# Patient Record
Sex: Male | Born: 1965 | Race: White | Hispanic: No | Marital: Married | Smoking: Current every day smoker
Health system: Southern US, Community
[De-identification: ages and names within clinical notes are randomized; demographics above are authoritative.]

## PROBLEM LIST (undated history)

## (undated) DIAGNOSIS — I6789 Other cerebrovascular disease: Secondary | ICD-10-CM

## (undated) DIAGNOSIS — G43909 Migraine, unspecified, not intractable, without status migrainosus: Secondary | ICD-10-CM

## (undated) DIAGNOSIS — Q231 Congenital insufficiency of aortic valve: Secondary | ICD-10-CM

## (undated) DIAGNOSIS — R0602 Shortness of breath: Secondary | ICD-10-CM

## (undated) DIAGNOSIS — I350 Nonrheumatic aortic (valve) stenosis: Secondary | ICD-10-CM

## (undated) DIAGNOSIS — I639 Cerebral infarction, unspecified: Secondary | ICD-10-CM

## (undated) DIAGNOSIS — K219 Gastro-esophageal reflux disease without esophagitis: Secondary | ICD-10-CM

## (undated) DIAGNOSIS — E039 Hypothyroidism, unspecified: Secondary | ICD-10-CM

## (undated) DIAGNOSIS — I69920 Aphasia following unspecified cerebrovascular disease: Secondary | ICD-10-CM

## (undated) DIAGNOSIS — Z7901 Long term (current) use of anticoagulants: Secondary | ICD-10-CM

## (undated) DIAGNOSIS — I712 Thoracic aortic aneurysm, without rupture, unspecified: Secondary | ICD-10-CM

## (undated) DIAGNOSIS — R55 Syncope and collapse: Secondary | ICD-10-CM

## (undated) DIAGNOSIS — G609 Hereditary and idiopathic neuropathy, unspecified: Secondary | ICD-10-CM

## (undated) DIAGNOSIS — Z952 Presence of prosthetic heart valve: Secondary | ICD-10-CM

## (undated) DIAGNOSIS — I359 Nonrheumatic aortic valve disorder, unspecified: Secondary | ICD-10-CM

## (undated) DIAGNOSIS — R51 Headache: Secondary | ICD-10-CM

## (undated) DIAGNOSIS — F419 Anxiety disorder, unspecified: Secondary | ICD-10-CM

## (undated) DIAGNOSIS — I6990 Unspecified sequelae of unspecified cerebrovascular disease: Secondary | ICD-10-CM

## (undated) DIAGNOSIS — G450 Vertebro-basilar artery syndrome: Secondary | ICD-10-CM

## (undated) DIAGNOSIS — I480 Paroxysmal atrial fibrillation: Secondary | ICD-10-CM

## (undated) DIAGNOSIS — Z8673 Personal history of transient ischemic attack (TIA), and cerebral infarction without residual deficits: Secondary | ICD-10-CM

## (undated) DIAGNOSIS — I7100 Dissection of unspecified site of aorta: Secondary | ICD-10-CM

## (undated) DIAGNOSIS — H53489 Generalized contraction of visual field, unspecified eye: Secondary | ICD-10-CM

## (undated) DIAGNOSIS — I6330 Cerebral infarction due to thrombosis of unspecified cerebral artery: Secondary | ICD-10-CM

## (undated) DIAGNOSIS — Q2381 Bicuspid aortic valve: Secondary | ICD-10-CM

## (undated) DIAGNOSIS — I499 Cardiac arrhythmia, unspecified: Secondary | ICD-10-CM

## (undated) DIAGNOSIS — R471 Dysarthria and anarthria: Secondary | ICD-10-CM

## (undated) DIAGNOSIS — J189 Pneumonia, unspecified organism: Secondary | ICD-10-CM

## (undated) DIAGNOSIS — I616 Nontraumatic intracerebral hemorrhage, multiple localized: Secondary | ICD-10-CM

## (undated) DIAGNOSIS — K8020 Calculus of gallbladder without cholecystitis without obstruction: Secondary | ICD-10-CM

## (undated) HISTORY — DX: Dysarthria and anarthria: R47.1

## (undated) HISTORY — DX: Vertebro-basilar artery syndrome: G45.0

## (undated) HISTORY — DX: Long term (current) use of anticoagulants: Z79.01

## (undated) HISTORY — DX: Cerebral infarction, unspecified: I63.9

## (undated) HISTORY — DX: Nontraumatic intracerebral hemorrhage, multiple localized: I61.6

## (undated) HISTORY — PX: ROTATOR CUFF REPAIR: SHX139

## (undated) HISTORY — DX: Syncope and collapse: R55

## (undated) HISTORY — DX: Other cerebrovascular disease: I67.89

## (undated) HISTORY — DX: Unspecified sequelae of unspecified cerebrovascular disease: I69.90

## (undated) HISTORY — DX: Pneumonia, unspecified organism: J18.9

## (undated) HISTORY — PX: CHOLECYSTECTOMY: SHX55

## (undated) HISTORY — DX: Thoracic aortic aneurysm, without rupture: I71.2

## (undated) HISTORY — DX: Nonrheumatic aortic valve disorder, unspecified: I35.9

## (undated) HISTORY — DX: Personal history of transient ischemic attack (TIA), and cerebral infarction without residual deficits: Z86.73

## (undated) HISTORY — PX: CARDIAC CATHETERIZATION: SHX172

## (undated) HISTORY — DX: Cerebral infarction due to thrombosis of unspecified cerebral artery: I63.30

## (undated) HISTORY — DX: Presence of prosthetic heart valve: Z95.2

## (undated) HISTORY — DX: Aphasia following unspecified cerebrovascular disease: I69.920

## (undated) HISTORY — DX: Hereditary and idiopathic neuropathy, unspecified: G60.9

## (undated) HISTORY — DX: Calculus of gallbladder without cholecystitis without obstruction: K80.20

## (undated) HISTORY — DX: Paroxysmal atrial fibrillation: I48.0

## (undated) HISTORY — DX: Generalized contraction of visual field, unspecified eye: H53.489

## (undated) HISTORY — DX: Dissection of unspecified site of aorta: I71.00

---

## 2012-04-22 ENCOUNTER — Emergency Department (HOSPITAL_COMMUNITY): Payer: Medicaid Other | Admitting: Anesthesiology

## 2012-04-22 ENCOUNTER — Encounter (HOSPITAL_COMMUNITY): Payer: Self-pay | Admitting: Anesthesiology

## 2012-04-22 ENCOUNTER — Encounter (HOSPITAL_COMMUNITY): Payer: Self-pay | Admitting: Certified Registered Nurse Anesthetist

## 2012-04-22 ENCOUNTER — Inpatient Hospital Stay (HOSPITAL_COMMUNITY): Payer: Medicaid Other

## 2012-04-22 ENCOUNTER — Encounter (HOSPITAL_COMMUNITY)
Admission: EM | Disposition: A | Discharge status: Home / Self Care | Payer: Self-pay | Source: Home / Self Care | Attending: Thoracic Surgery (Cardiothoracic Vascular Surgery)

## 2012-04-22 ENCOUNTER — Inpatient Hospital Stay (HOSPITAL_COMMUNITY)
Admission: EM | Admit: 2012-04-22 | Discharge: 2012-05-03 | DRG: 219 | Disposition: A | Discharge status: Home / Self Care | Payer: Medicaid Other | Attending: Thoracic Surgery (Cardiothoracic Vascular Surgery) | Admitting: Thoracic Surgery (Cardiothoracic Vascular Surgery)

## 2012-04-22 DIAGNOSIS — I7101 Dissection of thoracic aorta: Principal | ICD-10-CM | POA: Diagnosis present

## 2012-04-22 DIAGNOSIS — H35039 Hypertensive retinopathy, unspecified eye: Secondary | ICD-10-CM | POA: Diagnosis present

## 2012-04-22 DIAGNOSIS — I7779 Dissection of other artery: Secondary | ICD-10-CM | POA: Diagnosis present

## 2012-04-22 DIAGNOSIS — I6350 Cerebral infarction due to unspecified occlusion or stenosis of unspecified cerebral artery: Secondary | ICD-10-CM | POA: Diagnosis not present

## 2012-04-22 DIAGNOSIS — I7100 Dissection of unspecified site of aorta: Secondary | ICD-10-CM

## 2012-04-22 DIAGNOSIS — H538 Other visual disturbances: Secondary | ICD-10-CM | POA: Diagnosis present

## 2012-04-22 DIAGNOSIS — D696 Thrombocytopenia, unspecified: Secondary | ICD-10-CM | POA: Diagnosis not present

## 2012-04-22 DIAGNOSIS — R1310 Dysphagia, unspecified: Secondary | ICD-10-CM | POA: Diagnosis not present

## 2012-04-22 DIAGNOSIS — F411 Generalized anxiety disorder: Secondary | ICD-10-CM | POA: Diagnosis present

## 2012-04-22 DIAGNOSIS — R112 Nausea with vomiting, unspecified: Secondary | ICD-10-CM | POA: Diagnosis not present

## 2012-04-22 DIAGNOSIS — T8131XA Disruption of external operation (surgical) wound, not elsewhere classified, initial encounter: Secondary | ICD-10-CM | POA: Diagnosis not present

## 2012-04-22 DIAGNOSIS — J690 Pneumonitis due to inhalation of food and vomit: Secondary | ICD-10-CM | POA: Diagnosis not present

## 2012-04-22 DIAGNOSIS — I4891 Unspecified atrial fibrillation: Secondary | ICD-10-CM | POA: Diagnosis not present

## 2012-04-22 DIAGNOSIS — Y838 Other surgical procedures as the cause of abnormal reaction of the patient, or of later complication, without mention of misadventure at the time of the procedure: Secondary | ICD-10-CM | POA: Diagnosis not present

## 2012-04-22 DIAGNOSIS — I498 Other specified cardiac arrhythmias: Secondary | ICD-10-CM | POA: Diagnosis not present

## 2012-04-22 DIAGNOSIS — D689 Coagulation defect, unspecified: Secondary | ICD-10-CM | POA: Diagnosis present

## 2012-04-22 DIAGNOSIS — I7771 Dissection of carotid artery: Secondary | ICD-10-CM | POA: Diagnosis present

## 2012-04-22 DIAGNOSIS — I71019 Dissection of thoracic aorta, unspecified: Principal | ICD-10-CM | POA: Diagnosis present

## 2012-04-22 DIAGNOSIS — D62 Acute posthemorrhagic anemia: Secondary | ICD-10-CM | POA: Diagnosis not present

## 2012-04-22 DIAGNOSIS — F17200 Nicotine dependence, unspecified, uncomplicated: Secondary | ICD-10-CM | POA: Diagnosis present

## 2012-04-22 DIAGNOSIS — I10 Essential (primary) hypertension: Secondary | ICD-10-CM | POA: Diagnosis present

## 2012-04-22 DIAGNOSIS — J9819 Other pulmonary collapse: Secondary | ICD-10-CM | POA: Diagnosis not present

## 2012-04-22 DIAGNOSIS — Q231 Congenital insufficiency of aortic valve: Secondary | ICD-10-CM

## 2012-04-22 DIAGNOSIS — E8779 Other fluid overload: Secondary | ICD-10-CM | POA: Diagnosis not present

## 2012-04-22 DIAGNOSIS — E871 Hypo-osmolality and hyponatremia: Secondary | ICD-10-CM | POA: Diagnosis not present

## 2012-04-22 DIAGNOSIS — I519 Heart disease, unspecified: Secondary | ICD-10-CM | POA: Diagnosis not present

## 2012-04-22 HISTORY — DX: Bicuspid aortic valve: Q23.81

## 2012-04-22 HISTORY — PX: CORONARY ARTERY BYPASS GRAFT: SHX141

## 2012-04-22 HISTORY — PX: THORACIC AORTIC ANEURYSM REPAIR: SHX799

## 2012-04-22 HISTORY — DX: Nonrheumatic aortic (valve) stenosis: I35.0

## 2012-04-22 HISTORY — DX: Congenital insufficiency of aortic valve: Q23.1

## 2012-04-22 HISTORY — DX: Anxiety disorder, unspecified: F41.9

## 2012-04-22 HISTORY — PX: INTRAOPERATIVE TRANSESOPHAGEAL ECHOCARDIOGRAM: SHX5062

## 2012-04-22 LAB — POCT I-STAT 3, ART BLOOD GAS (G3+)
Acid-Base Excess: 5 mmol/L — ABNORMAL HIGH (ref 0.0–2.0)
Acid-base deficit: 7 mmol/L — ABNORMAL HIGH (ref 0.0–2.0)
Acid-base deficit: 2 mmol/L (ref 0.0–2.0)
Bicarbonate: 23.5 mEq/L (ref 20.0–24.0)
Bicarbonate: 30.4 mEq/L — ABNORMAL HIGH (ref 20.0–24.0)
Bicarbonate: 20.5 mEq/L (ref 20.0–24.0)
Bicarbonate: 24.3 mEq/L — ABNORMAL HIGH (ref 20.0–24.0)
Bicarbonate: 25.8 mEq/L — ABNORMAL HIGH (ref 20.0–24.0)
O2 Saturation: 100 %
O2 Saturation: 100 %
O2 Saturation: 90 %
O2 Saturation: 96 %
Patient temperature: 37.6
TCO2: 27 mmol/L (ref 0–100)
TCO2: 32 mmol/L (ref 0–100)
TCO2: 19 mmol/L (ref 0–100)
TCO2: 27 mmol/L (ref 0–100)
pCO2 arterial: 60.9 mmHg (ref 35.0–45.0)
pCO2 arterial: 77.1 mmHg (ref 35.0–45.0)
pCO2 arterial: 31.8 mmHg — ABNORMAL LOW (ref 35.0–45.0)
pCO2 arterial: 50.8 mmHg — ABNORMAL HIGH (ref 35.0–45.0)
pCO2 arterial: 52.9 mmHg — ABNORMAL HIGH (ref 35.0–45.0)
pH, Arterial: 7.093 — CL (ref 7.350–7.450)
pH, Arterial: 7.194 — CL (ref 7.350–7.450)
pH, Arterial: 7.227 — ABNORMAL LOW (ref 7.350–7.450)
pO2, Arterial: 318 mmHg — ABNORMAL HIGH (ref 80.0–100.0)
pO2, Arterial: 191 mmHg — ABNORMAL HIGH (ref 80.0–100.0)
pO2, Arterial: 168 mmHg — ABNORMAL HIGH (ref 80.0–100.0)
pO2, Arterial: 360 mmHg — ABNORMAL HIGH (ref 80.0–100.0)
pO2, Arterial: 71 mmHg — ABNORMAL LOW (ref 80.0–100.0)

## 2012-04-22 LAB — POCT I-STAT 4, (NA,K, GLUC, HGB,HCT)
Glucose, Bld: 124 mg/dL — ABNORMAL HIGH (ref 70–99)
Glucose, Bld: 190 mg/dL — ABNORMAL HIGH (ref 70–99)
Glucose, Bld: 175 mg/dL — ABNORMAL HIGH (ref 70–99)
Glucose, Bld: 141 mg/dL — ABNORMAL HIGH (ref 70–99)
HCT: 34 % — ABNORMAL LOW (ref 39.0–52.0)
HCT: 27 % — ABNORMAL LOW (ref 39.0–52.0)
HCT: 32 % — ABNORMAL LOW (ref 39.0–52.0)
HCT: 30 % — ABNORMAL LOW (ref 39.0–52.0)
Hemoglobin: 11.6 g/dL — ABNORMAL LOW (ref 13.0–17.0)
Hemoglobin: 9.9 g/dL — ABNORMAL LOW (ref 13.0–17.0)
Hemoglobin: 9.2 g/dL — ABNORMAL LOW (ref 13.0–17.0)
Hemoglobin: 10.9 g/dL — ABNORMAL LOW (ref 13.0–17.0)
Hemoglobin: 6.5 g/dL — CL (ref 13.0–17.0)
Hemoglobin: 9.5 g/dL — ABNORMAL LOW (ref 13.0–17.0)
Hemoglobin: 10.2 g/dL — ABNORMAL LOW (ref 13.0–17.0)
Hemoglobin: 7.5 g/dL — ABNORMAL LOW (ref 13.0–17.0)
Potassium: 4.5 mEq/L (ref 3.5–5.1)
Potassium: 3.3 mEq/L — ABNORMAL LOW (ref 3.5–5.1)
Potassium: 4 mEq/L (ref 3.5–5.1)
Potassium: 4.5 mEq/L (ref 3.5–5.1)
Potassium: 4.2 mEq/L (ref 3.5–5.1)
Sodium: 136 mEq/L (ref 135–145)
Sodium: 138 mEq/L (ref 135–145)
Sodium: 130 mEq/L — ABNORMAL LOW (ref 135–145)
Sodium: 153 mEq/L — ABNORMAL HIGH (ref 135–145)
Sodium: 142 mEq/L (ref 135–145)

## 2012-04-22 LAB — POCT I-STAT, CHEM 8
BUN: 13 mg/dL (ref 6–23)
Calcium, Ion: 1.14 mmol/L (ref 1.12–1.23)
Chloride: 105 mEq/L (ref 96–112)
Creatinine, Ser: 0.9 mg/dL (ref 0.50–1.35)
Glucose, Bld: 120 mg/dL — ABNORMAL HIGH (ref 70–99)

## 2012-04-22 LAB — CREATININE, SERUM
Creatinine, Ser: 0.96 mg/dL (ref 0.50–1.35)
GFR calc non Af Amer: >90 mL/min (ref >90)

## 2012-04-22 LAB — CBC WITH DIFFERENTIAL/PLATELET
Basophils Absolute: 0 10*3/uL (ref 0.0–0.1)
HCT: 37.6 % — ABNORMAL LOW (ref 39.0–52.0)
Hemoglobin: 12.9 g/dL — ABNORMAL LOW (ref 13.0–17.0)
Lymphocytes Relative: 10 % — ABNORMAL LOW (ref 12–46)
Monocytes Absolute: 0.6 10*3/uL (ref 0.1–1.0)
Monocytes Relative: 6 % (ref 3–12)
Neutro Abs: 9.3 10*3/uL — ABNORMAL HIGH (ref 1.7–7.7)
WBC: 11.1 10*3/uL — ABNORMAL HIGH (ref 4.0–10.5)

## 2012-04-22 LAB — CBC
HCT: 18.6 % — ABNORMAL LOW (ref 39.0–52.0)
Hemoglobin: 6.3 g/dL — CL (ref 13.0–17.0)
RBC: 2.11 MIL/uL — ABNORMAL LOW (ref 4.22–5.81)
WBC: 12.3 10*3/uL — ABNORMAL HIGH (ref 4.0–10.5)

## 2012-04-22 LAB — DIC (DISSEMINATED INTRAVASCULAR COAGULATION)PANEL
D-Dimer, Quant: 3.88 ug/mL-FEU — ABNORMAL HIGH (ref 0.00–0.48)
Fibrinogen: 243 mg/dL (ref 204–475)
Fibrinogen: 117 mg/dL — ABNORMAL LOW (ref 204–475)
INR: 2.2 — ABNORMAL HIGH (ref 0.00–1.49)
Platelets: 63 10*3/uL — ABNORMAL LOW (ref 150–400)
Prothrombin Time: 23.5 seconds — ABNORMAL HIGH (ref 11.6–15.2)
Smear Review: NONE SEEN
aPTT: 35 seconds (ref 24–37)
aPTT: 80 seconds — ABNORMAL HIGH (ref 24–37)

## 2012-04-22 LAB — PROTIME-INR: INR: 0.87 (ref 0.00–1.49)

## 2012-04-22 LAB — ABO/RH: ABO/RH(D): O POS

## 2012-04-22 LAB — APTT: aPTT: 43 seconds — ABNORMAL HIGH (ref 24–37)

## 2012-04-22 LAB — GLUCOSE, CAPILLARY: Glucose-Capillary: 78 mg/dL (ref 70–99)

## 2012-04-22 LAB — PLATELET COUNT: Platelets: 35 10*3/uL — ABNORMAL LOW (ref 150–400)

## 2012-04-22 LAB — HEMOGLOBIN AND HEMATOCRIT, BLOOD: Hemoglobin: 9.7 g/dL — ABNORMAL LOW (ref 13.0–17.0)

## 2012-04-22 SURGERY — REPAIR, ANEURYSM, AORTA, THORACIC, ASCENDING
Anesthesia: General | Site: Chest

## 2012-04-22 MED ORDER — PHENYLEPHRINE HCL 10 MG/ML IJ SOLN
0.0000 ug/min | INTRAVENOUS | Status: DC
  Administered 2012-04-22 (×2): 66.667 ug/min via INTRAVENOUS
  Administered 2012-04-23: 53.333 ug/min via INTRAVENOUS
  Administered 2012-04-23: 66.667 ug/min via INTRAVENOUS
  Filled 2012-04-22 (×5): qty 2

## 2012-04-22 MED ORDER — SODIUM CHLORIDE 0.9 % IJ SOLN
OROMUCOSAL | Status: DC | PRN
  Administered 2012-04-22 (×4): via TOPICAL

## 2012-04-22 MED ORDER — DEXMEDETOMIDINE HCL IN NACL 200 MCG/50ML IV SOLN
0.1000 ug/kg/h | INTRAVENOUS | Status: DC
  Administered 2012-04-22 – 2012-04-23 (×5): 0.7 ug/kg/h via INTRAVENOUS
  Filled 2012-04-22 (×5): qty 50

## 2012-04-22 MED ORDER — PLASMA-LYTE 148 IV SOLN
INTRAVENOUS | Status: DC
  Filled 2012-04-22: qty 2.5

## 2012-04-22 MED ORDER — ETOMIDATE 2 MG/ML IV SOLN
INTRAVENOUS | Status: DC | PRN
  Administered 2012-04-22 (×2): 20 mg via INTRAVENOUS
  Administered 2012-04-22: 10 mg via INTRAVENOUS

## 2012-04-22 MED ORDER — SUCCINYLCHOLINE CHLORIDE 20 MG/ML IJ SOLN
INTRAMUSCULAR | Status: DC | PRN
  Administered 2012-04-22: 140 mg via INTRAVENOUS

## 2012-04-22 MED ORDER — POTASSIUM CHLORIDE 2 MEQ/ML IV SOLN
80.0000 meq | INTRAVENOUS | Status: DC
  Filled 2012-04-22: qty 40

## 2012-04-22 MED ORDER — LACTATED RINGERS IV SOLN
INTRAVENOUS | Status: DC | PRN
  Administered 2012-04-22: 02:00:00 via INTRAVENOUS

## 2012-04-22 MED ORDER — ASPIRIN 81 MG PO CHEW: 324.0000 mg | CHEWABLE_TABLET | Freq: Every day | ORAL | Status: DC

## 2012-04-22 MED ORDER — VANCOMYCIN HCL 10 G IV SOLR
1250.0000 mg | INTRAVENOUS | Status: DC
  Filled 2012-04-22: qty 1250

## 2012-04-22 MED ORDER — LEVOFLOXACIN IN D5W 500 MG/100ML IV SOLN
500.0000 mg | INTRAVENOUS | Status: AC
  Administered 2012-04-22: .5 g via INTRAVENOUS
  Filled 2012-04-22: qty 100

## 2012-04-22 MED ORDER — MAGNESIUM SULFATE 50 % IJ SOLN
40.0000 meq | INTRAMUSCULAR | Status: DC
  Filled 2012-04-22: qty 10

## 2012-04-22 MED ORDER — 0.9 % SODIUM CHLORIDE (POUR BTL) OPTIME
TOPICAL | Status: DC | PRN
  Administered 2012-04-22: 6000 mL

## 2012-04-22 MED ORDER — VANCOMYCIN HCL 1000 MG IV SOLR
1000.0000 mg | INTRAVENOUS | Status: DC | PRN
  Administered 2012-04-22: 1250 mg via INTRAVENOUS

## 2012-04-22 MED ORDER — DEXMEDETOMIDINE HCL IN NACL 400 MCG/100ML IV SOLN
0.1000 ug/kg/h | INTRAVENOUS | Status: AC
  Administered 2012-04-22: 0.2 ug/kg/h via INTRAVENOUS
  Filled 2012-04-22 (×2): qty 100

## 2012-04-22 MED ORDER — PANTOPRAZOLE SODIUM 40 MG PO TBEC
40.0000 mg | DELAYED_RELEASE_TABLET | Freq: Every day | ORAL | Status: DC
  Administered 2012-04-24 – 2012-05-03 (×9): 40 mg via ORAL
  Filled 2012-04-22 (×9): qty 1

## 2012-04-22 MED ORDER — AMIODARONE LOAD VIA INFUSION
150.0000 mg | Freq: Once | INTRAVENOUS | Status: DC
  Filled 2012-04-22: qty 83.34

## 2012-04-22 MED ORDER — AMIODARONE HCL IN DEXTROSE 360-4.14 MG/200ML-% IV SOLN
60.0000 mg/h | INTRAVENOUS | Status: DC
  Filled 2012-04-22: qty 200

## 2012-04-22 MED ORDER — PROTAMINE SULFATE 10 MG/ML IV SOLN
INTRAVENOUS | Status: DC | PRN
  Administered 2012-04-22: 250 mg via INTRAVENOUS
  Administered 2012-04-22: 25 mg via INTRAVENOUS
  Administered 2012-04-22: 210 mg via INTRAVENOUS

## 2012-04-22 MED ORDER — METOPROLOL TARTRATE 1 MG/ML IV SOLN
2.5000 mg | INTRAVENOUS | Status: DC | PRN
  Administered 2012-04-26: 5 mg via INTRAVENOUS
  Filled 2012-04-22: qty 5

## 2012-04-22 MED ORDER — ALBUMIN HUMAN 5 % IV SOLN
INTRAVENOUS | Status: DC | PRN
  Administered 2012-04-22: 10:00:00 via INTRAVENOUS

## 2012-04-22 MED ORDER — MIDAZOLAM HCL 5 MG/5ML IJ SOLN
INTRAMUSCULAR | Status: DC | PRN
  Administered 2012-04-22: 3 mg via INTRAVENOUS
  Administered 2012-04-22: 2 mg via INTRAVENOUS
  Administered 2012-04-22 (×2): 3 mg via INTRAVENOUS
  Administered 2012-04-22 (×2): 2 mg via INTRAVENOUS

## 2012-04-22 MED ORDER — AMIODARONE HCL IN DEXTROSE 360-4.14 MG/200ML-% IV SOLN
INTRAVENOUS | Status: DC | PRN
  Administered 2012-04-22: 60 mg/h via INTRAVENOUS

## 2012-04-22 MED ORDER — DOCUSATE SODIUM 100 MG PO CAPS
200.0000 mg | ORAL_CAPSULE | Freq: Every day | ORAL | Status: DC
  Administered 2012-04-24 – 2012-05-03 (×7): 200 mg via ORAL
  Filled 2012-04-22 (×9): qty 2

## 2012-04-22 MED ORDER — SODIUM CHLORIDE 0.9 % IV SOLN
INTRAVENOUS | Status: AC
  Administered 2012-04-22: 1.3 [IU]/h via INTRAVENOUS
  Filled 2012-04-22: qty 1

## 2012-04-22 MED ORDER — HEMOSTATIC AGENTS (NO CHARGE) OPTIME
TOPICAL | Status: DC | PRN
  Administered 2012-04-22 (×11): 1 via TOPICAL

## 2012-04-22 MED ORDER — PHENYLEPHRINE HCL 10 MG/ML IJ SOLN
20.0000 mg | INTRAVENOUS | Status: DC | PRN
  Administered 2012-04-22: 25 ug/min via INTRAVENOUS

## 2012-04-22 MED ORDER — AMIODARONE IV BOLUS ONLY 150 MG/100ML
INTRAVENOUS | Status: DC | PRN
  Administered 2012-04-22: 150 mg via INTRAVENOUS

## 2012-04-22 MED ORDER — SODIUM CHLORIDE 0.9 % IV SOLN
INTRAVENOUS | Status: DC | PRN
  Administered 2012-04-22 (×2): via INTRAVENOUS

## 2012-04-22 MED ORDER — ACETAMINOPHEN 160 MG/5ML PO SOLN
975.0000 mg | Freq: Four times a day (QID) | ORAL | Status: DC
  Filled 2012-04-22: qty 40.6

## 2012-04-22 MED ORDER — MAGNESIUM SULFATE 40 MG/ML IJ SOLN
INTRAMUSCULAR | Status: AC
  Administered 2012-04-22: 15:00:00
  Filled 2012-04-22: qty 100

## 2012-04-22 MED ORDER — OXYCODONE HCL 5 MG PO TABS
5.0000 mg | ORAL_TABLET | ORAL | Status: DC | PRN
  Administered 2012-04-23 – 2012-04-24 (×7): 10 mg via ORAL
  Administered 2012-04-25 – 2012-04-26 (×2): 5 mg via ORAL
  Filled 2012-04-22: qty 1
  Filled 2012-04-22 (×4): qty 2
  Filled 2012-04-22: qty 1
  Filled 2012-04-22 (×3): qty 2

## 2012-04-22 MED ORDER — MAGNESIUM SULFATE 40 MG/ML IJ SOLN
4.0000 g | Freq: Once | INTRAMUSCULAR | Status: AC
  Administered 2012-04-22: 4 g via INTRAVENOUS

## 2012-04-22 MED ORDER — VECURONIUM BROMIDE 10 MG IV SOLR
INTRAVENOUS | Status: DC | PRN
  Administered 2012-04-22 (×5): 5 mg via INTRAVENOUS

## 2012-04-22 MED ORDER — POTASSIUM CHLORIDE 10 MEQ/50ML IV SOLN: 10.0000 meq | INTRAVENOUS | Status: AC

## 2012-04-22 MED ORDER — SODIUM CHLORIDE 0.9 % IV SOLN
INTRAVENOUS | Status: DC
  Filled 2012-04-22: qty 1

## 2012-04-22 MED ORDER — VANCOMYCIN HCL IN DEXTROSE 1-5 GM/200ML-% IV SOLN
1000.0000 mg | Freq: Once | INTRAVENOUS | Status: AC
  Administered 2012-04-22: 1000 mg via INTRAVENOUS
  Filled 2012-04-22: qty 200

## 2012-04-22 MED ORDER — DOPAMINE-DEXTROSE 3.2-5 MG/ML-% IV SOLN
0.0000 ug/kg/min | INTRAVENOUS | Status: DC
  Administered 2012-04-23: 5 ug/kg/min via INTRAVENOUS
  Filled 2012-04-22: qty 250

## 2012-04-22 MED ORDER — ARTIFICIAL TEARS OP OINT
TOPICAL_OINTMENT | OPHTHALMIC | Status: DC | PRN
  Administered 2012-04-22: 1 via OPHTHALMIC

## 2012-04-22 MED ORDER — DEXTROSE 5 % IV SOLN
30.0000 ug/min | INTRAVENOUS | Status: DC
  Filled 2012-04-22: qty 2

## 2012-04-22 MED ORDER — ONDANSETRON HCL 4 MG/2ML IJ SOLN
4.0000 mg | Freq: Four times a day (QID) | INTRAMUSCULAR | Status: DC | PRN
  Administered 2012-04-24 – 2012-04-25 (×2): 4 mg via INTRAVENOUS
  Filled 2012-04-22 (×3): qty 2

## 2012-04-22 MED ORDER — LACTATED RINGERS IV SOLN: INTRAVENOUS | Status: DC

## 2012-04-22 MED ORDER — ROCURONIUM BROMIDE 100 MG/10ML IV SOLN
INTRAVENOUS | Status: DC | PRN
  Administered 2012-04-22: 50 mg via INTRAVENOUS

## 2012-04-22 MED ORDER — INSULIN ASPART 100 UNIT/ML ~~LOC~~ SOLN
0.0000 [IU] | SUBCUTANEOUS | Status: DC
  Administered 2012-04-22 – 2012-04-23 (×2): 2 [IU] via SUBCUTANEOUS

## 2012-04-22 MED ORDER — SODIUM CHLORIDE 0.9 % IV SOLN: 250.0000 mL | INTRAVENOUS | Status: DC

## 2012-04-22 MED ORDER — SODIUM CHLORIDE 0.45 % IV SOLN
INTRAVENOUS | Status: DC
  Administered 2012-04-22 – 2012-04-25 (×2): via INTRAVENOUS

## 2012-04-22 MED ORDER — METOPROLOL TARTRATE 25 MG/10 ML ORAL SUSPENSION
12.5000 mg | Freq: Two times a day (BID) | ORAL | Status: DC
  Filled 2012-04-22 (×5): qty 5

## 2012-04-22 MED ORDER — DOPAMINE-DEXTROSE 3.2-5 MG/ML-% IV SOLN
2.0000 ug/kg/min | INTRAVENOUS | Status: DC
  Filled 2012-04-22: qty 250

## 2012-04-22 MED ORDER — ACETAMINOPHEN 500 MG PO TABS
1000.0000 mg | ORAL_TABLET | Freq: Four times a day (QID) | ORAL | Status: AC
  Administered 2012-04-23 – 2012-04-27 (×17): 1000 mg via ORAL
  Filled 2012-04-22 (×19): qty 2

## 2012-04-22 MED ORDER — BISACODYL 10 MG RE SUPP: 10.0000 mg | Freq: Every day | RECTAL | Status: DC

## 2012-04-22 MED ORDER — EPINEPHRINE HCL 1 MG/ML IJ SOLN
1000.0000 ug | INTRAVENOUS | Status: DC | PRN
  Administered 2012-04-22: 5 ug/min via INTRAVENOUS

## 2012-04-22 MED ORDER — METOPROLOL TARTRATE 12.5 MG HALF TABLET
12.5000 mg | ORAL_TABLET | Freq: Two times a day (BID) | ORAL | Status: DC
  Filled 2012-04-22 (×5): qty 1

## 2012-04-22 MED ORDER — SODIUM CHLORIDE 0.9 % IV SOLN
INTRAVENOUS | Status: AC
  Administered 2012-04-22: 140 mL/h via INTRAVENOUS
  Filled 2012-04-22: qty 40

## 2012-04-22 MED ORDER — NITROGLYCERIN IN D5W 200-5 MCG/ML-% IV SOLN: 0.0000 ug/min | INTRAVENOUS | Status: DC

## 2012-04-22 MED ORDER — AMIODARONE HCL IN DEXTROSE 360-4.14 MG/200ML-% IV SOLN
30.0000 mg/h | INTRAVENOUS | Status: DC
  Administered 2012-04-22 – 2012-04-26 (×6): 30 mg/h via INTRAVENOUS
  Filled 2012-04-22 (×16): qty 200

## 2012-04-22 MED ORDER — SODIUM CHLORIDE 0.9 % IV SOLN
INTRAVENOUS | Status: DC
  Filled 2012-04-22: qty 40

## 2012-04-22 MED ORDER — HEPARIN SODIUM (PORCINE) 1000 UNIT/ML IJ SOLN
INTRAMUSCULAR | Status: DC | PRN
  Administered 2012-04-22: 20000 [IU] via INTRAVENOUS
  Administered 2012-04-22: 25000 [IU] via INTRAVENOUS
  Administered 2012-04-22: 5000 [IU] via INTRAVENOUS

## 2012-04-22 MED ORDER — SODIUM CHLORIDE 0.9 % IV SOLN: INTRAVENOUS | Status: DC

## 2012-04-22 MED ORDER — SODIUM CHLORIDE 0.9 % IJ SOLN
3.0000 mL | INTRAMUSCULAR | Status: DC | PRN
  Administered 2012-04-25 – 2012-04-27 (×2): 3 mL via INTRAVENOUS

## 2012-04-22 MED ORDER — ACETAMINOPHEN 10 MG/ML IV SOLN
1000.0000 mg | Freq: Once | INTRAVENOUS | Status: AC
  Administered 2012-04-22: 1000 mg via INTRAVENOUS
  Filled 2012-04-22: qty 100

## 2012-04-22 MED ORDER — EPHEDRINE SULFATE 50 MG/ML IJ SOLN
INTRAMUSCULAR | Status: DC | PRN
  Administered 2012-04-22: 10 mg via INTRAVENOUS

## 2012-04-22 MED ORDER — MORPHINE SULFATE 2 MG/ML IJ SOLN
2.0000 mg | INTRAMUSCULAR | Status: DC | PRN
  Administered 2012-04-22 – 2012-04-24 (×8): 4 mg via INTRAVENOUS
  Administered 2012-04-27: 2 mg via INTRAVENOUS
  Filled 2012-04-22 (×8): qty 2
  Filled 2012-04-22: qty 1

## 2012-04-22 MED ORDER — SODIUM CHLORIDE 0.9 % IJ SOLN
3.0000 mL | Freq: Two times a day (BID) | INTRAMUSCULAR | Status: DC
  Administered 2012-04-23 – 2012-04-27 (×7): 3 mL via INTRAVENOUS

## 2012-04-22 MED ORDER — SODIUM BICARBONATE 4.2 % IV SOLN
INTRAVENOUS | Status: DC | PRN
  Administered 2012-04-22: 25 meq via INTRAVENOUS

## 2012-04-22 MED ORDER — BISACODYL 5 MG PO TBEC
10.0000 mg | DELAYED_RELEASE_TABLET | Freq: Every day | ORAL | Status: DC
  Administered 2012-04-24 – 2012-04-30 (×4): 10 mg via ORAL
  Filled 2012-04-22: qty 2
  Filled 2012-04-22: qty 1
  Filled 2012-04-22 (×4): qty 2

## 2012-04-22 MED ORDER — METHYLPREDNISOLONE SODIUM SUCC 1000 MG IJ SOLR
2000.0000 mg | Freq: Once | INTRAVENOUS | Status: AC
  Administered 2012-04-22: 2000 mg via INTRAVENOUS
  Filled 2012-04-22: qty 16

## 2012-04-22 MED ORDER — PLASMA-LYTE 148 IV SOLN
INTRAVENOUS | Status: AC
  Administered 2012-04-22: 11:00:00
  Filled 2012-04-22: qty 2.5

## 2012-04-22 MED ORDER — ALBUMIN HUMAN 5 % IV SOLN: 250.0000 mL | INTRAVENOUS | Status: AC | PRN

## 2012-04-22 MED ORDER — LEVOFLOXACIN IN D5W 750 MG/150ML IV SOLN
750.0000 mg | INTRAVENOUS | Status: DC
  Filled 2012-04-22: qty 150

## 2012-04-22 MED ORDER — NITROGLYCERIN IN D5W 200-5 MCG/ML-% IV SOLN
2.0000 ug/min | INTRAVENOUS | Status: AC
  Administered 2012-04-22: 5 ug/min via INTRAVENOUS
  Filled 2012-04-22: qty 250

## 2012-04-22 MED ORDER — MIDAZOLAM HCL 2 MG/2ML IJ SOLN
2.0000 mg | INTRAMUSCULAR | Status: DC | PRN
  Administered 2012-04-22 – 2012-04-23 (×5): 2 mg via INTRAVENOUS
  Filled 2012-04-22 (×5): qty 2

## 2012-04-22 MED ORDER — FENTANYL CITRATE 0.05 MG/ML IJ SOLN
INTRAMUSCULAR | Status: DC | PRN
  Administered 2012-04-22: 250 ug via INTRAVENOUS
  Administered 2012-04-22: 100 ug via INTRAVENOUS
  Administered 2012-04-22 (×2): 200 ug via INTRAVENOUS

## 2012-04-22 MED ORDER — INSULIN REGULAR BOLUS VIA INFUSION
0.0000 [IU] | Freq: Three times a day (TID) | INTRAVENOUS | Status: DC
  Filled 2012-04-22: qty 10

## 2012-04-22 MED ORDER — HEMOSTATIC AGENTS (NO CHARGE) OPTIME
TOPICAL | Status: DC | PRN
  Administered 2012-04-22: 1 via TOPICAL

## 2012-04-22 MED ORDER — CALCIUM CHLORIDE 10 % IV SOLN
INTRAVENOUS | Status: DC | PRN
  Administered 2012-04-22 (×5): 0.5 g via INTRAVENOUS

## 2012-04-22 MED ORDER — LACTATED RINGERS IV SOLN: 500.0000 mL | Freq: Once | INTRAVENOUS | Status: AC | PRN

## 2012-04-22 MED ORDER — EPINEPHRINE HCL 1 MG/ML IJ SOLN
0.5000 ug/min | INTRAVENOUS | Status: DC
  Filled 2012-04-22: qty 4

## 2012-04-22 MED ORDER — FAMOTIDINE IN NACL 20-0.9 MG/50ML-% IV SOLN
20.0000 mg | Freq: Two times a day (BID) | INTRAVENOUS | Status: AC
  Administered 2012-04-22 (×2): 20 mg via INTRAVENOUS
  Filled 2012-04-22: qty 50

## 2012-04-22 MED ORDER — MORPHINE SULFATE 2 MG/ML IJ SOLN: 1.0000 mg | INTRAMUSCULAR | Status: AC | PRN

## 2012-04-22 MED ORDER — DOPAMINE-DEXTROSE 1.6-5 MG/ML-% IV SOLN
INTRAVENOUS | Status: DC | PRN
  Administered 2012-04-22: 5 ug/kg/min via INTRAVENOUS

## 2012-04-22 MED ORDER — COAGULATION FACTOR VIIA RECOMB 1 MG IV SOLR
90.0000 ug/kg | INTRAVENOUS | Status: DC
  Filled 2012-04-22: qty 8

## 2012-04-22 MED ORDER — ASPIRIN EC 325 MG PO TBEC
325.0000 mg | DELAYED_RELEASE_TABLET | Freq: Every day | ORAL | Status: DC
  Administered 2012-04-24 – 2012-04-28 (×4): 325 mg via ORAL
  Filled 2012-04-22 (×6): qty 1

## 2012-04-22 SURGICAL SUPPLY — 130 items
ADAPTER CARDIO PERF ANTE/RETRO (ADAPTER) ×6 IMPLANT
BAG DECANTER FOR FLEXI CONT (MISCELLANEOUS) IMPLANT
BANDAGE ELASTIC 4 VELCRO ST LF (GAUZE/BANDAGES/DRESSINGS) ×3 IMPLANT
BANDAGE ELASTIC 6 VELCRO ST LF (GAUZE/BANDAGES/DRESSINGS) ×3 IMPLANT
BANDAGE GAUZE ELAST BULKY 4 IN (GAUZE/BANDAGES/DRESSINGS) ×3 IMPLANT
BLADE MINI RND TIP GREEN BEAV (BLADE) ×3 IMPLANT
BLADE NEEDLE 3 SS STRL (BLADE) ×3 IMPLANT
BLADE STERNUM SYSTEM 6 (BLADE) ×3 IMPLANT
BLADE SURG 15 STRL LF DISP TIS (BLADE) ×2 IMPLANT
BLADE SURG 15 STRL SS (BLADE) ×1
BLADE SURG ROTATE 9660 (MISCELLANEOUS) ×9 IMPLANT
BOOT SUTURE AID YELLOW STND (SUTURE) ×3 IMPLANT
CANISTER SUCTION 2500CC (MISCELLANEOUS) ×3 IMPLANT
CANN PRFSN .5XCNCT 15X34-48 (MISCELLANEOUS)
CANNULA ARTERIAL 007325 (MISCELLANEOUS) IMPLANT
CANNULA ARTERIAL 14F 007324 (MISCELLANEOUS) IMPLANT
CANNULA ARTERIAL 18F 007308 (MISCELLANEOUS) IMPLANT
CANNULA ARTERIAL 20F L7309 (MISCELLANEOUS) IMPLANT
CANNULA ARTERIAL 22F 007310 (MISCELLANEOUS) IMPLANT
CANNULA ARTERIAL 24F 007311 (MISCELLANEOUS) IMPLANT
CANNULA PRFSN .5XCNCT 15X34-48 (MISCELLANEOUS) IMPLANT
CANNULA SOFTFLOW AORTIC 7M21FR (CANNULA) ×3 IMPLANT
CANNULA VEN 2 STAGE (MISCELLANEOUS)
CATH CPB KIT HENDRICKSON (MISCELLANEOUS) ×3 IMPLANT
CATH HEART VENT LEFT (CATHETERS) ×2 IMPLANT
CATH THORACIC 28FR (CATHETERS) ×3 IMPLANT
CATH THORACIC 36FR RT ANG (CATHETERS) ×3 IMPLANT
CATH/SQUID NICHOLS JEHLE COR (CATHETERS) ×3 IMPLANT
CAUTERY EYE LOW TEMP 1300F FIN (OPHTHALMIC RELATED) ×3 IMPLANT
CLIP TI MEDIUM 6 (CLIP) ×6 IMPLANT
CLIP TI WIDE RED SMALL 24 (CLIP) ×3 IMPLANT
CLOTH BEACON ORANGE TIMEOUT ST (SAFETY) ×3 IMPLANT
CONN 1/2X1/2X1/2  BEN (MISCELLANEOUS)
CONN 1/2X1/2X1/2 BEN (MISCELLANEOUS) IMPLANT
CONN 3/8X3/8 GISH STERILE (MISCELLANEOUS) IMPLANT
CONN ST 1/4X3/8  BEN (MISCELLANEOUS) ×2
CONN ST 1/4X3/8 BEN (MISCELLANEOUS) ×4 IMPLANT
CONN Y 3/8X3/8X3/8  BEN (MISCELLANEOUS)
CONN Y 3/8X3/8X3/8 BEN (MISCELLANEOUS) IMPLANT
CONT SPEC 4OZ CLIKSEAL STRL BL (MISCELLANEOUS) ×9 IMPLANT
COVER MAYO STAND STRL (DRAPES) ×6 IMPLANT
COVER SURGICAL LIGHT HANDLE (MISCELLANEOUS) ×6 IMPLANT
CRADLE DONUT ADULT HEAD (MISCELLANEOUS) ×3 IMPLANT
DERMABOND ADVANCED (GAUZE/BANDAGES/DRESSINGS) ×1
DERMABOND ADVANCED .7 DNX12 (GAUZE/BANDAGES/DRESSINGS) ×2 IMPLANT
DRAIN CHANNEL 32F RND 10.7 FF (WOUND CARE) ×6 IMPLANT
DRSG COVADERM 4X14 (GAUZE/BANDAGES/DRESSINGS) ×3 IMPLANT
ELECT REM PT RETURN 9FT ADLT (ELECTROSURGICAL) ×6
ELECTRODE REM PT RTRN 9FT ADLT (ELECTROSURGICAL) ×4 IMPLANT
FELT TEFLON 6X6 (MISCELLANEOUS) ×3 IMPLANT
GLOVE BIO SURGEON STRL SZ 6 (GLOVE) ×18 IMPLANT
GLOVE BIO SURGEON STRL SZ 6.5 (GLOVE) ×18 IMPLANT
GLOVE BIO SURGEON STRL SZ7.5 (GLOVE) ×3 IMPLANT
GLOVE BIOGEL PI IND STRL 6 (GLOVE) ×6 IMPLANT
GLOVE BIOGEL PI IND STRL 7.5 (GLOVE) ×2 IMPLANT
GLOVE BIOGEL PI INDICATOR 6 (GLOVE) ×3
GLOVE BIOGEL PI INDICATOR 7.5 (GLOVE) ×1
GOWN STRL NON-REIN LRG LVL3 (GOWN DISPOSABLE) ×36 IMPLANT
GRAFT CV STRG 30X7KNIT SFT (Vascular Products) ×2 IMPLANT
GRAFT HEMASHIELD 7MM (Vascular Products) ×1 IMPLANT
GRAFT WOVEN D/V 30DX30L (Vascular Products) ×3 IMPLANT
HEMOSTAT POWDER SURGIFOAM 1G (HEMOSTASIS) ×15 IMPLANT
HEMOSTAT SURGICEL 2X14 (HEMOSTASIS) ×33 IMPLANT
INSERT FOGARTY SM (MISCELLANEOUS) ×9 IMPLANT
INSERT FOGARTY XLG (MISCELLANEOUS) ×3 IMPLANT
KIT BASIN OR (CUSTOM PROCEDURE TRAY) ×3 IMPLANT
KIT DILATOR VASC 18G NDL (KITS) ×3 IMPLANT
KIT DRAINAGE VACCUM ASSIST (KITS) ×3 IMPLANT
KIT ROOM TURNOVER OR (KITS) ×3 IMPLANT
KIT SUCTION CATH 14FR (SUCTIONS) ×6 IMPLANT
LINE VENT (MISCELLANEOUS) ×3 IMPLANT
LOOP VESSEL MAXI BLUE (MISCELLANEOUS) ×3 IMPLANT
NEEDLE 18GX1X1/2 (RX/OR ONLY) (NEEDLE) ×3 IMPLANT
NEEDLE AORTIC AIR ASPIRATING (NEEDLE) ×3 IMPLANT
NS IRRIG 1000ML POUR BTL (IV SOLUTION) ×21 IMPLANT
PACK OPEN HEART (CUSTOM PROCEDURE TRAY) ×3 IMPLANT
PAD ARMBOARD 7.5X6 YLW CONV (MISCELLANEOUS) ×6 IMPLANT
PAD DEFIB STAT PADZ MULTI (MISCELLANEOUS) ×3 IMPLANT
SEALANT SURG COSEAL 8ML (VASCULAR PRODUCTS) ×6 IMPLANT
SET CARDIOPLEGIA MPS 5001102 (MISCELLANEOUS) ×3 IMPLANT
SPONGE GAUZE 4X4 12PLY (GAUZE/BANDAGES/DRESSINGS) ×6 IMPLANT
SPONGE LAP 18X18 X RAY DECT (DISPOSABLE) ×9 IMPLANT
SPONGE LAP 4X18 X RAY DECT (DISPOSABLE) IMPLANT
STAPLER VISISTAT 35W (STAPLE) IMPLANT
STOPCOCK 4 WAY LG BORE MALE ST (IV SETS) IMPLANT
SUCKER INTRACARDIAC WEIGHTED (SUCKER) ×3 IMPLANT
SUT ETHIBON 2 0 V 52N 30 (SUTURE) ×6 IMPLANT
SUT ETHIBOND 2 0 SH (SUTURE) ×3
SUT ETHIBOND 2 0 SH 36X2 (SUTURE) ×6 IMPLANT
SUT ETHIBOND NAB MH 2-0 36IN (SUTURE) ×3 IMPLANT
SUT MNCRL AB 3-0 PS2 18 (SUTURE) IMPLANT
SUT MNCRL AB 4-0 PS2 18 (SUTURE) ×6 IMPLANT
SUT PROLENE 3 0 RB 1 (SUTURE) ×3 IMPLANT
SUT PROLENE 3 0 SH DA (SUTURE) ×15 IMPLANT
SUT PROLENE 4 0 RB 1 (SUTURE) ×23
SUT PROLENE 4 0 SH DA (SUTURE) ×9 IMPLANT
SUT PROLENE 4-0 RB1 .5 CRCL 36 (SUTURE) ×42 IMPLANT
SUT PROLENE 4-0 RB1 18X2 ARM (SUTURE) ×4 IMPLANT
SUT PROLENE 5 0 C 1 36 (SUTURE) ×42 IMPLANT
SUT PROLENE 6 0 C 1 30 (SUTURE) ×21 IMPLANT
SUT PROLENE 6 0 CC (SUTURE) IMPLANT
SUT PROLENE 7 0 BV 1 (SUTURE) ×3 IMPLANT
SUT PROLENE 7 0 BV1 MDA (SUTURE) IMPLANT
SUT SILK  1 MH (SUTURE) ×2
SUT SILK 1 MH (SUTURE) ×4 IMPLANT
SUT SILK 2 0SH CR/8 30 (SUTURE) ×3 IMPLANT
SUT STEEL STERNAL CCS#1 18IN (SUTURE) ×3 IMPLANT
SUT STEEL SZ 6 DBL 3X14 BALL (SUTURE) ×3 IMPLANT
SUT VIC AB 1 CT1 18XCR BRD 8 (SUTURE) IMPLANT
SUT VIC AB 1 CT1 8-18 (SUTURE)
SUT VIC AB 1 CTX 27 (SUTURE) ×6 IMPLANT
SUT VIC AB 2-0 CT1 27 (SUTURE) ×3
SUT VIC AB 2-0 CT1 TAPERPNT 27 (SUTURE) ×6 IMPLANT
SUT VIC AB 2-0 CTX 27 (SUTURE) ×3 IMPLANT
SUT VIC AB 2-0 CTX 36 (SUTURE) ×6 IMPLANT
SUT VIC AB 3-0 X1 27 (SUTURE) ×6 IMPLANT
SUT VICRYL 4-0 PS2 18IN ABS (SUTURE) IMPLANT
SUTURE E-PAK OPEN HEART (SUTURE) ×3 IMPLANT
SYSTEM SAHARA CHEST DRAIN ATS (WOUND CARE) ×3 IMPLANT
TAPE CLOTH SURG 4X10 WHT LF (GAUZE/BANDAGES/DRESSINGS) ×3 IMPLANT
TAPE PAPER 2X10 WHT MICROPORE (GAUZE/BANDAGES/DRESSINGS) ×3 IMPLANT
TOWEL OR 17X24 6PK STRL BLUE (TOWEL DISPOSABLE) ×3 IMPLANT
TOWEL OR 17X26 10 PK STRL BLUE (TOWEL DISPOSABLE) ×3 IMPLANT
TRAY CATH LUMEN 1 20CM STRL (SET/KITS/TRAYS/PACK) IMPLANT
TRAY FOLEY IC TEMP SENS 14FR (CATHETERS) ×3 IMPLANT
VALVE AOR 27X28XMECH ROT (Prosthesis & Implant Heart) ×2 IMPLANT
VALVE AORTIC (Prosthesis & Implant Heart) ×1 IMPLANT
VENT LEFT HEART 12002 (CATHETERS) ×3
WATER STERILE IRR 1000ML POUR (IV SOLUTION) ×6 IMPLANT
YANKAUER SUCT BULB TIP NO VENT (SUCTIONS) ×3 IMPLANT

## 2012-04-22 NOTE — ED Notes (Addendum)
Dr Dorris Fetch has been informed that pt is here, telephone orders received and labs placed

## 2012-04-22 NOTE — ED Notes (Signed)
Patient was brought in by [facility], a transfer from [facility].  Patient was complaining of chest pain.  MD there ascertained the patient has an aneurysm in the aortic arch that is dissecting into the left carotid.  Patient also had an AAA.  Patient was given Aspirin 324mg , 1inch of Nitro Paste and .5 of Dilaudid for chest pain of 4/10, and now he is a 0/10.

## 2012-04-22 NOTE — OR Nursing (Signed)
[phone] called to notify of approximate 15 minute arrival time.  Spoke with [employer]

## 2012-04-22 NOTE — Anesthesia Procedure Notes (Signed)
Procedures

## 2012-04-22 NOTE — Transfer of Care (Signed)
Immediate Anesthesia Transfer of Care Note  Patient: Jon [name]  Procedure(s) Performed: Procedure(s) (LRB) with comments: THORACIC ASCENDING ANEURYSM REPAIR (AAA) (N/A) CORONARY ARTERY BYPASS GRAFTING (CABG) (N/A) - times one with right greater saphenous vein graft INTRAOPERATIVE TRANSESOPHAGEAL ECHOCARDIOGRAM (N/A)  Patient Location: SICU  Anesthesia Type:General  Level of Consciousness: sedated and Patient remains intubated per anesthesia plan  Airway & Oxygen Therapy: Patient remains intubated per anesthesia plan and Patient placed on Ventilator (see vital sign flow sheet for setting)  Post-op Assessment: Report given to PACU RN and Post -op Vital signs reviewed and stable  Post vital signs: Reviewed and stable  Complications: No apparent anesthesia complications

## 2012-04-22 NOTE — Brief Op Note (Addendum)
04/22/2012  11:45 AM  PATIENT:  Jon [name]  46 y.o. 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 2.Bicuspid aortic vavle   PROCEDURE:  EMERGENT MEDIAN STERNOTOMY FOR RESECTION and REPAIR of ASCENDING THORACIC AORTIC ANEURYSM and AORTIC ARCH with RECONSTRUCTION of PROXIMAL ARCH(INNOMINATE and LCCA) using HEMASHIELD GRAFT with VALVE CONDUIT (size 27 mm Carbomedics mechanical valve), CORONARY ARTERY BYPASS GRAFTING (CABG) X 1 (SVG to right coronary button), OPEN HARVEST of RIGHT THIGH SAPHENOUS VEIN  SURGEON:  Surgeon(s) and Role:    * [name], MD - Primary    * [name], MD - Assisting  PHYSICIAN ASSISTANT: [name] PA-C   ANESTHESIA:   general  EBL:  Total I/O In: 3303 [I.V.:2800; Blood:503] Out: 900 [Urine:900]    DRAINS:  Chest Tube(s) in the Mediastinal and Pleural spaces    SPECIMEN:  Source of Specimen:  Native aortic valve leaflets   DISPOSITION OF SPECIMEN:  PATHOLOGY  COUNTS CORRECT:  YES  DICTATION: .Dragon Dictation  PLAN OF CARE: Admit to inpatient   PATIENT DISPOSITION:  ICU - intubated and critically ill.   Delay start of Pharmacological VTE agent (>24hrs) due to surgical blood loss or risk of bleeding: yes   PRE OP WEIGHT: 84 kg  XC= 193 + 35 CPB= 273 + 98 CIRC ARREST 87 min/ SACP 68 min  FINDINGS 7.5 cm ascending thoracic aneurysm with dissection extending to Aortic valve proximally and LCCA distally. LCCA compromised by thrombosed false lumen. Intima sheared off at origin of Innominate artery. 7 mm Hemashield used to reconstruct Innominate and LCCA. Bicuspid aortic valve with fused L/R cusps- thickened and sclerotic

## 2012-04-22 NOTE — OR Nursing (Signed)
Volunteer desk called at 1213 to notify family that patient was off pump.  Spoke with Tobie Lords.  2300 called for 45 minute call and spoke with [facility] SICU Charge RN.

## 2012-04-22 NOTE — OR Nursing (Signed)
SICU Charge RN Jamesetta So called to update on change in patient status.

## 2012-04-22 NOTE — Progress Notes (Signed)
[name] emergently from OR [facility] FFP [id number] [phone] emergently, hung ffp [id number] [phone] emergently from OR, hung FFP w2012 [phone] emergently from OR, all verified by Patsy Lager RN

## 2012-04-22 NOTE — ED Notes (Addendum)
Dr Hendrickson at bedside.

## 2012-04-22 NOTE — OR Nursing (Signed)
SICU Charge RN Phyliss called at 1233 to notify of approximate 25 minute arrival.

## 2012-04-22 NOTE — ED Notes (Signed)
Pt to OR with RN and MD, pt on monitor. OR staff ready. Family at bedside. Consent signed for procedure and blood.

## 2012-04-22 NOTE — Progress Notes (Signed)
[facility] BRIEF SICU PROGRESS NOTE  Day of Surgery  S/P Procedure(s) (LRB): THORACIC ASCENDING ANEURYSM REPAIR (AAA) (N/A) CORONARY ARTERY BYPASS GRAFTING (CABG) (N/A) INTRAOPERATIVE TRANSESOPHAGEAL ECHOCARDIOGRAM (N/A)   Sedated on vent NSR w/ stable BP, PA pressures low Chest tube output 50 mL/hr last 2 hours UOP excellent Hgb 6.3 - receiving 2 units PRBC's  Plan: Keep stable and lightly sedated overnight.  Treat acute blood loss anemia as needed.  Watch chest tube output  OWEN,CLARENCE H 04/22/2012 6:55 PM

## 2012-04-22 NOTE — OR Nursing (Signed)
SICU Charge RN Jamesetta So contacted to update on patient status.

## 2012-04-22 NOTE — H&P (Signed)
Jon [name] is an 46 y.o. male.   Chief Complaint: Chest pain  HPI: 46 yo WM with history of aortic stenosis since childhood who developed sudden onset of severe CP at ~6:30 PM yesterday. He thought initially it might resolve, but it continued to worsen and he went to [facility] ED. CT angiogram showed a Type A dissection. He was initially hypertensive, but BP responded to NTP. He still is having 3/10 CP. He denies SOB.  PMH Aortic stenosis since childhood  No past surgical history on file.  No family history on file. Social History:  does not have a smoking history on file. He does not have any smokeless tobacco history on file. His alcohol and drug histories not on file.History of EtOH in past, quit several years ago  Allergies:  Allergies  Allergen Reactions  . Dilaudid (Hydromorphone Hcl) Other (See Comments)    High doses cause itching  . Penicillins Other (See Comments)    Unknown     (Not in a hospital admission)  Results for orders placed during the hospital encounter of 04/22/12 (from the past 48 hour(s))  TYPE AND SCREEN     Status: Normal (Preliminary result)   Collection Time   04/22/12  1:34 AM      Component Value Range Comment   ABO/RH(D) PENDING      Antibody Screen PENDING      Sample Expiration 04/25/2012      Unit Number [id number]      Blood Component Type RBC LR PHER1      Unit division 00      Status of Unit ISSUED      Unit tag comment VERBAL ORDERS PER DR Lynnell Fiumara      Transfusion Status OK TO TRANSFUSE      Crossmatch Result PENDING      Unit Number [id number]      Blood Component Type RBC CPDA1, LR      Unit division 00      Status of Unit ISSUED      Unit tag comment VERBAL ORDERS PER DR Rodriquez Thorner      Transfusion Status OK TO TRANSFUSE      Crossmatch Result PENDING      No results found.  Review of Systems  Constitutional: Negative for fever and chills.  HENT: Negative for nosebleeds.   Cardiovascular: Positive for  chest pain. Negative for orthopnea, claudication and leg swelling.  Neurological: Negative for focal weakness, seizures and loss of consciousness.  All other systems reviewed and are negative.    Blood pressure 105/65, temperature 97.6 F (36.4 C), temperature source Oral, resp. rate 20, weight 185 lb (83.915 kg), SpO2 99.00%. Physical Exam  Vitals reviewed. Constitutional: He is oriented to person, place, and time. He appears well-developed and well-nourished. No distress.  HENT:  Head: Normocephalic and atraumatic.  Eyes: EOM are normal. Pupils are equal, round, and reactive to light.  Neck: Neck supple. No thyromegaly present.  Cardiovascular: Normal rate, regular rhythm and intact distal pulses.   Murmur (harsh 4/6 systolic) heard. Respiratory: Breath sounds normal. No respiratory distress. He has no rales.  GI: Soft. There is no tenderness.  Musculoskeletal: He exhibits no edema.  Lymphadenopathy:    He has no cervical adenopathy.  Neurological: He is alert and oriented to person, place, and time. No cranial nerve deficit.  Skin: Skin is warm and dry.    CT angio reviewed- Type 1 dissection involving entire ascending aorta to level of left common carotid.  The left CCA has a significant filling defect c/w a thrombosed false lumen.  Assessment/Plan 46 yo with an ascending aortic dissection and known AS(although severity is unknown). He needs emergent surgical repair of the aortic dissection with replacement of the ascending aorta and aortic arch. In all likelihood will also need an AVR. We will assess the AV with TEE intraoperatively.  I have discussed with the patient the general nature of the procedure, need for general anesthesia and circulatory arrest, and the incisions to be used. I discussed the expected hospital stay, overall recovery and short and long term outcomes. He understands the risks include but are not limited to death, stroke or other neurologic complication, MI,  DVT/PE, bleeding, possible need for transfusion, infections, and other organ system dysfunction including respiratory, renal, or GI complications. He does understand the possible need for mechanical valve replacement and the need for lifelong anticoagulation if that is necessary. He accepts these risks and agrees to proceed.   Maxwell Lemen C 04/22/2012, 2:11 AM

## 2012-04-22 NOTE — Anesthesia Preprocedure Evaluation (Addendum)
Anesthesia Evaluation  Patient identified by MRN, date of birth, ID band Patient awake    Reviewed: Allergy & Precautions, H&P , NPO status , Patient's Chart, lab work & pertinent test results  Airway       Dental   Pulmonary Current Smoker,          Cardiovascular  Dissecting ascending thoracic aneurysm   Neuro/Psych    GI/Hepatic   Endo/Other    Renal/GU      Musculoskeletal   Abdominal   Peds  Hematology   Anesthesia Other Findings   Reproductive/Obstetrics                           Anesthesia Physical Anesthesia Plan  ASA: IV and emergent  Anesthesia Plan:    Post-op Pain Management:    Induction:   Airway Management Planned:   Additional Equipment:   Intra-op Plan:   Post-operative Plan:   Informed Consent:   Plan Discussed with:   Anesthesia Plan Comments:         Anesthesia Quick Evaluation

## 2012-04-22 NOTE — Progress Notes (Signed)
CRITICAL VALUE ALERT  Critical value received: Hemoglobin 6.3  Date of notification:  04/22/2012  Time of notification:  1606  Critical value read back:yes  Nurse who received alert:  Linton Flemings   MD notified (1st page):  Dorris Fetch   Time of first page:  1606  MD notified (2nd page):  Time of second page:  Responding MD: Dorris Fetch Time MD responded:  [phone]

## 2012-04-22 NOTE — ED Provider Notes (Signed)
History     CSN: [id number]  Arrival date & time 04/22/12  0120   First MD Initiated Contact with Patient 04/22/12 0129      Chief Complaint  Patient presents with  . AAA    (Consider location/radiation/quality/duration/timing/severity/associated sxs/prior treatment) HPI         No past medical history on file.  No past surgical history on file.  No family history on file.  History  Substance Use Topics  . Smoking status: Not on file  . Smokeless tobacco: Not on file  . Alcohol Use: Not on file      Review of Systems  Allergies  Review of patient's allergies indicates not on file.  Home Medications  No current outpatient prescriptions on file.  BP 105/65  Temp 97.6 F (36.4 C) (Oral)  Resp 20  SpO2 99%  Physical Exam  ED Course  Procedures (including critical care time)   Labs Reviewed  TYPE AND SCREEN  CBC WITH DIFFERENTIAL  DIC (DISSEMINATED INTRAVASCULAR COAGULATION) PANEL  PREPARE RBC (CROSSMATCH)   No results found.   No diagnosis found.    MDM    Note: pt not seen by EDP, his surgeon was present on patients arrival to ED, and he saw pt in room in ED on pts arrival.            Suzi Roots, MD 04/22/12 [phone]

## 2012-04-23 ENCOUNTER — Encounter (HOSPITAL_COMMUNITY): Payer: Self-pay | Admitting: Thoracic Surgery (Cardiothoracic Vascular Surgery)

## 2012-04-23 ENCOUNTER — Inpatient Hospital Stay (HOSPITAL_COMMUNITY): Payer: Medicaid Other

## 2012-04-23 LAB — GLUCOSE, CAPILLARY
Glucose-Capillary: 125 mg/dL — ABNORMAL HIGH (ref 70–99)
Glucose-Capillary: 136 mg/dL — ABNORMAL HIGH (ref 70–99)

## 2012-04-23 LAB — BASIC METABOLIC PANEL
CO2: 27 mEq/L (ref 19–32)
GFR calc non Af Amer: >90 mL/min (ref >90)
Glucose, Bld: 133 mg/dL — ABNORMAL HIGH (ref 70–99)
Potassium: 4.6 mEq/L (ref 3.5–5.1)
Sodium: 139 mEq/L (ref 135–145)

## 2012-04-23 LAB — POCT I-STAT 3, ART BLOOD GAS (G3+)
Acid-Base Excess: 3 mmol/L — ABNORMAL HIGH (ref 0.0–2.0)
Acid-Base Excess: 1 mmol/L (ref 0.0–2.0)
Bicarbonate: 26.5 mEq/L — ABNORMAL HIGH (ref 20.0–24.0)
O2 Saturation: 98 %
O2 Saturation: 93 %
pO2, Arterial: 88 mmHg (ref 80.0–100.0)
pO2, Arterial: 72 mmHg — ABNORMAL LOW (ref 80.0–100.0)

## 2012-04-23 LAB — PREPARE PLATELET PHERESIS
Unit division: 0
Unit division: 0
Unit division: 0

## 2012-04-23 LAB — PREPARE FRESH FROZEN PLASMA
Unit division: 0
Unit division: 0
Unit division: 0
Unit division: 0
Unit division: 0

## 2012-04-23 LAB — CBC
HCT: 22.4 % — ABNORMAL LOW (ref 39.0–52.0)
Hemoglobin: 7.9 g/dL — ABNORMAL LOW (ref 13.0–17.0)
MCH: 30.6 pg (ref 26.0–34.0)
MCHC: 35.3 g/dL (ref 30.0–36.0)
Platelets: 79 10*3/uL — ABNORMAL LOW (ref 150–400)
RDW: 14.9 % (ref 11.5–15.5)
WBC: 11.3 10*3/uL — ABNORMAL HIGH (ref 4.0–10.5)

## 2012-04-23 LAB — POCT I-STAT, CHEM 8
Chloride: 99 mEq/L (ref 96–112)
Creatinine, Ser: 0.8 mg/dL (ref 0.50–1.35)
Glucose, Bld: 139 mg/dL — ABNORMAL HIGH (ref 70–99)
HCT: 21 % — ABNORMAL LOW (ref 39.0–52.0)
Potassium: 4.4 mEq/L (ref 3.5–5.1)

## 2012-04-23 LAB — MAGNESIUM: Magnesium: 1.8 mg/dL (ref 1.5–2.5)

## 2012-04-23 LAB — CREATININE, SERUM: GFR calc non Af Amer: >90 mL/min (ref >90)

## 2012-04-23 MED ORDER — WARFARIN - PHYSICIAN DOSING INPATIENT
Freq: Every day | Status: DC
Start: 2012-04-23 — End: 2012-05-03
  Administered 2012-04-26 – 2012-04-30 (×3)

## 2012-04-23 MED ORDER — ALBUMIN HUMAN 5 % IV SOLN
12.5000 g | Freq: Once | INTRAVENOUS | Status: AC
  Administered 2012-04-23: 12.5 g via INTRAVENOUS

## 2012-04-23 MED ORDER — WARFARIN SODIUM 2.5 MG PO TABS
2.5000 mg | ORAL_TABLET | Freq: Once | ORAL | Status: AC
  Administered 2012-04-23: 2.5 mg via ORAL
  Filled 2012-04-23: qty 1

## 2012-04-23 MED ORDER — PATIENT'S GUIDE TO USING COUMADIN BOOK
Freq: Once | Status: AC
  Administered 2012-04-23: 13:00:00
  Filled 2012-04-23: qty 1

## 2012-04-23 MED ORDER — LEVALBUTEROL HCL 0.63 MG/3ML IN NEBU
0.6300 mg | INHALATION_SOLUTION | Freq: Four times a day (QID) | RESPIRATORY_TRACT | Status: DC
  Administered 2012-04-23 – 2012-04-25 (×6): 0.63 mg via RESPIRATORY_TRACT
  Filled 2012-04-23 (×13): qty 3

## 2012-04-23 MED ORDER — INSULIN ASPART 100 UNIT/ML ~~LOC~~ SOLN
0.0000 [IU] | SUBCUTANEOUS | Status: DC
  Administered 2012-04-23 – 2012-04-24 (×3): 2 [IU] via SUBCUTANEOUS

## 2012-04-23 MED ORDER — FUROSEMIDE 10 MG/ML IJ SOLN
20.0000 mg | Freq: Two times a day (BID) | INTRAMUSCULAR | Status: AC
  Administered 2012-04-23 (×2): 20 mg via INTRAVENOUS

## 2012-04-23 MED ORDER — WARFARIN VIDEO: Freq: Once | Status: DC

## 2012-04-23 MED ORDER — BOOST / RESOURCE BREEZE PO LIQD
1.0000 | Freq: Three times a day (TID) | ORAL | Status: DC
  Administered 2012-04-23 – 2012-05-01 (×9): 1 via ORAL
  Filled 2012-04-23: qty 1

## 2012-04-23 MED FILL — Potassium Chloride Inj 2 mEq/ML: INTRAVENOUS | Qty: 40 | Status: AC

## 2012-04-23 MED FILL — Magnesium Sulfate Inj 50%: INTRAMUSCULAR | Qty: 10 | Status: AC

## 2012-04-23 NOTE — Progress Notes (Signed)
INITIAL NUTRITION ASSESSMENT  DOCUMENTATION CODES Per approved criteria  -Not Applicable   INTERVENTION:  Resource Breeze 3 times daily between meals (250 kcals, 9 gm protein per 8 fl oz carton) RD to follow for nutrition care plan  NUTRITION DIAGNOSIS: Increased nutrient needs related to post-op healing as evidenced by estimated nutrition needs  Goal: Oral intake with meals & supplements to meet >/= 90% of estimated nutrition needs  Monitor:  PO & supplemental intake, weight, labs, I/O's  Reason for Assessment: Malnutrition Screening Tool Report  46 y.o. male  Admitting Dx:  ascending thoracic aortic aneurysm dissection up to the left common carotid artery  ASSESSMENT: Patient with history of aortic stenosis since childhood who developed sudden onset of severe chest pain;  went to [facility] ED; CT angiogram showed a Type A dissection; transferred to [facility].  Patient s/p emergent median sternotomy, CABG & open harvest of R thigh saphenous vein 12/17.  RD unable to obtain nutrition hx at this time; extubated this AM; weight has fluctuated since admission, likely due to fluid; would benefit from addition of nutrition supplements -- RD to order.  Height: Ht Readings from Last 1 Encounters:  04/23/12 5\' 8"  (1.727 m)    Weight: Wt Readings from Last 1 Encounters:  04/23/12 189 lb 2.5 oz (85.8 kg)    Ideal Body Weight: 70 kg  % Ideal Body Weight: 121%  Wt Readings from Last 10 Encounters:  04/23/12 189 lb 2.5 oz (85.8 kg)  04/23/12 189 lb 2.5 oz (85.8 kg)    Usual Body Weight: unable to obtain  % Usual Body Weight: ---  BMI:  Body mass index is 28.76 kg/(m^2).  Estimated Nutritional Needs: Kcal: 2100-2300 Protein: 115-125 gm Fluid: 2.1-2.3 L  Skin: Intact  Diet Order: Clear Liquid  EDUCATION NEEDS: -No education needs identified at this time   Intake/Output Summary (Last 24 hours) at 04/23/12 1116 Last data filed at 04/23/12 0900  Gross per 24 hour   Intake 11652.28 ml  Output  16109 ml  Net 1362.28 ml    Labs:   Lab 04/23/12 0420 04/22/12 2024 04/22/12 1950 04/22/12 1438  NA 139 142 -- 144  K 4.6 4.6 -- 4.2  CL 105 105 -- --  CO2 27 -- -- --  BUN 15 13 -- --  CREATININE 0.85 0.90 0.96 --  CALCIUM 8.1* -- -- --  MG 1.8 -- 2.4 --  PHOS -- -- -- --  GLUCOSE 133* 120* -- 76    CBG (last 3)   Basename 04/23/12 0834 04/23/12 0506 04/22/12 2353  GLUCAP 105* 136* 126*    Scheduled Meds:   . acetaminophen  1,000 mg Oral Q6H   Or  . acetaminophen (TYLENOL) oral liquid 160 mg/5 mL  975 mg Per Tube Q6H  . albumin human  12.5 g Intravenous Once  . aspirin EC  325 mg Oral Daily   Or  . aspirin  324 mg Per Tube Daily  . bisacodyl  10 mg Oral Daily   Or  . bisacodyl  10 mg Rectal Daily  . docusate sodium  200 mg Oral Daily  . furosemide  20 mg Intravenous BID  . insulin aspart  0-24 Units Subcutaneous Q4H  . levalbuterol  0.63 mg Nebulization Q6H  . metoprolol tartrate  12.5 mg Oral BID   Or  . metoprolol tartrate  12.5 mg Per Tube BID  . pantoprazole  40 mg Oral Daily  . patient's guide to using coumadin book  Does not apply Once  . sodium chloride  3 mL Intravenous Q12H  . warfarin  2.5 mg Oral ONCE-1800  . warfarin   Does not apply Once  . Warfarin - Physician Dosing Inpatient   Does not apply q1800    Continuous Infusions:   . sodium chloride 20 mL/hr at 04/22/12 1505  . sodium chloride    . sodium chloride    . amiodarone (NEXTERONE PREMIX) 360 mg/200 mL dextrose 30 mg/hr (04/22/12 2357)  . dexmedetomidine Stopped (04/23/12 0830)  . DOPamine 5 mcg/kg/min (04/22/12 2000)  . lactated ringers    . nitroGLYCERIN    . phenylephrine (NEO-SYNEPHRINE) Adult infusion 46.667 mcg/min (04/23/12 0600)    Past Medical History  Diagnosis Date  . Anxiety     Past Surgical History  Procedure Date  . Cardiac catheterization   . Rotator cuff repair     12 years ago, Left    Kirkland Hun, RD, LDN Pager  #: [phone] After-Hours Pager #: [phone]

## 2012-04-23 NOTE — Procedures (Signed)
Extubation Procedure Note  Patient Details:   Name: Jon [name] DOB: 04-28-1966 MRN: [id number]   Airway Documentation:  Airway 8 mm (Active)  Secured at (cm) 24 cm 04/23/2012  9:10 AM  Measured From Lips 04/23/2012  9:10 AM  Secured Location Right 04/23/2012  9:10 AM  Secured By Caron Presume Tape 04/23/2012  9:10 AM  Cuff Pressure (cm H2O) 22 cm H2O 04/23/2012  9:10 AM  Site Condition Dry 04/23/2012  9:10 AM    Evaluation  O2 sats: stable throughout Complications: No apparent complications Patient did tolerate procedure well. Bilateral Breath Sounds: Clear Suctioning: Oral;Airway Yes  Pt is awake and can follow commands. Pt has good cough/gag. Positive cuff leak. NIF -45, VC 1.3. Extubated pt to 4L [place]. IS done 1250 times 10. Pt tol well  Kandis Nab 04/23/2012, 10:15 AM

## 2012-04-23 NOTE — Progress Notes (Signed)
1 Day Post-Op Procedure(s) (LRB): THORACIC ASCENDING ANEURYSM REPAIR (AAA) (N/A) CORONARY ARTERY BYPASS GRAFTING (CABG) (N/A) INTRAOPERATIVE TRANSESOPHAGEAL ECHOCARDIOGRAM (N/A) Subjective: Intubated, but alert and following commands  Objective: Vital signs in last 24 hours: Temp:  [99 F (37.2 [name])-102.2 F (39 [name])] 99.1 F (37.3 [name]) (12/18 0800) Pulse Rate:  [85-89] 87  (12/18 0800) Cardiac Rhythm:  [-] A-V Sequential paced (12/18 0000) Resp:  [12-21] 17  (12/18 0800) BP: (93-126)/(62-84) 97/64 mmHg (12/18 0800) SpO2:  [97 %-100 %] 97 % (12/18 0800) Arterial Line BP: (81-122)/(46-68) 100/46 mmHg (12/18 0800) FiO2 (%):  [50 %] 50 % (12/18 0336) Weight:  [189 lb 2.5 oz (85.8 kg)-194 lb 0.1 oz (88 kg)] 189 lb 2.5 oz (85.8 kg) (12/18 0500)  Hemodynamic parameters for last 24 hours: PAP: (18-34)/(3-22) 19/4 mmHg CO:  [4.1 L/min-4.8 L/min] 4.6 L/min CI:  [2 L/min/m2-2.4 L/min/m2] 2.3 L/min/m2  Intake/Output from previous day: 12/17 0701 - 12/18 0700 In: 56213 [I.V.:7742; YQMVH:8469; IV Piggyback:564] Out: 10145 [Urine:3275; Blood:6200; Chest Tube:670] Intake/Output this shift:    General appearance: alert and no distress Neurologic: intact Heart: regular rate and rhythm Lungs: rhonchi bilaterally  Lab Results:  Basename 04/23/12 0420 04/22/12 2024 04/22/12 1950  WBC 16.1* -- 11.3*  HGB 8.8* 8.2* --  HCT 24.3* 24.0* --  PLT 60* -- 79*   BMET:  Basename 04/23/12 0420 04/22/12 2024  NA 139 142  K 4.6 4.6  CL 105 105  CO2 27 --  GLUCOSE 133* 120*  BUN 15 13  CREATININE 0.85 0.90  CALCIUM 8.1* --    PT/INR:  Basename 04/22/12 1539  LABPROT 11.8  INR 0.87   ABG    Component Value Date/Time   PHART 7.336* 04/22/2012 1433   HCO3 25.8* 04/22/2012 1433   TCO2 27 04/22/2012 2024   ACIDBASEDEF 6.0* 04/22/2012 1218   O2SAT 96.0 04/22/2012 1433   CBG (last 3)   Basename 04/23/12 0834 04/23/12 0506 04/22/12 2353  GLUCAP 105* 136* 126*    Assessment/Plan: S/P  Procedure(s) (LRB): THORACIC ASCENDING ANEURYSM REPAIR (AAA) (N/A) CORONARY ARTERY BYPASS GRAFTING (CABG) (N/A) INTRAOPERATIVE TRANSESOPHAGEAL ECHOCARDIOGRAM (N/A) POD # 1 Repair type A dissection with mechanical AVR  CV- ECG shows possible septal infarct, RBBB, 1st degree AV block- will check enzymes which are sure to be elevated in this setting  Good hemodynamics- d/[name] swan, wean neo and dopamine  Mechanical AVR- start low dose coumadin today  SVT in OR- continue IV amiodarone for now  RESP- wean to extubate  RUL atelectasis- will need aggressive pulmonary hygiene after extubation  RENAL- lytes, creatinine OK- diurese  ENDO- CBG mildly elevated- monitor and treat as needed  ANEMIA secondary to ABL- improved after transfusions  COAGULOPATHY- resolved with treatment-   THROMBOCYTOPENIA- improved with treatment, but still present- no heparin/ lovenox  NEURO- no apparent deficit   LOS: 1 day    Jon [name] 04/23/2012

## 2012-04-23 NOTE — Progress Notes (Signed)
Patient ID: Jon [name], male   DOB: 10/23/1965, 46 y.o.   MRN: [id number]                   [address]            [place] [address]          [phone]     1 Day Post-Op Procedure(s) (LRB): THORACIC ASCENDING ANEURYSM REPAIR (AAA) (N/A) CORONARY ARTERY BYPASS GRAFTING (CABG) (N/A) INTRAOPERATIVE TRANSESOPHAGEAL ECHOCARDIOGRAM (N/A)  Total Length of Stay:  LOS: 1 day  BP 87/53  Pulse 89  Temp 97.8 F (36.6 C) (Oral)  Resp 16  Ht 5\' 8"  (1.727 m)  Wt 189 lb 2.5 oz (85.8 kg)  BMI 28.76 kg/m2  SpO2 99%  .Intake/Output      12/18 0701 - 12/19 0700   I.V. (mL/kg) 974 (11.4)   Blood    IV Piggyback 254   Total Intake(mL/kg) 1228 (14.3)   Urine (mL/kg/hr) 1385 (1.3)   Blood    Chest Tube 520   Total Output 1905   Net -677            . sodium chloride 20 mL/hr at 04/22/12 1505  . sodium chloride    . sodium chloride    . amiodarone (NEXTERONE PREMIX) 360 mg/200 mL dextrose 30 mg/hr (04/23/12 1247)  . dexmedetomidine Stopped (04/23/12 0830)  . DOPamine 2.5 mcg/kg/min (04/23/12 1842)  . lactated ringers    . nitroGLYCERIN    . phenylephrine (NEO-SYNEPHRINE) Adult infusion 13.333 mcg/min (04/23/12 1842)     Lab Results  Component Value Date   WBC 16.1* 04/23/2012   HGB 7.1* 04/23/2012   HCT 21.0* 04/23/2012   PLT 60* 04/23/2012   GLUCOSE 139* 04/23/2012   NA 136 04/23/2012   K 4.4 04/23/2012   CL 99 04/23/2012   CREATININE 0.80 04/23/2012   BUN 21 04/23/2012   CO2 27 04/23/2012   INR 0.87 04/22/2012   Thrombocytopenia   Up in chair Neuro intact  Delight Ovens MD  Beeper [phone] Office [phone] 04/23/2012 7:21 PM

## 2012-04-24 ENCOUNTER — Inpatient Hospital Stay (HOSPITAL_COMMUNITY): Payer: Medicaid Other

## 2012-04-24 LAB — CBC
HCT: 21.3 % — ABNORMAL LOW (ref 39.0–52.0)
Hemoglobin: 8.8 g/dL — ABNORMAL LOW (ref 13.0–17.0)
Hemoglobin: 7.5 g/dL — ABNORMAL LOW (ref 13.0–17.0)
RBC: 2.84 MIL/uL — ABNORMAL LOW (ref 4.22–5.81)
RDW: 15.2 % (ref 11.5–15.5)
WBC: 16.5 10*3/uL — ABNORMAL HIGH (ref 4.0–10.5)

## 2012-04-24 LAB — BASIC METABOLIC PANEL
BUN: 34 mg/dL — ABNORMAL HIGH (ref 6–23)
CO2: 27 mEq/L (ref 19–32)
Calcium: 7.9 mg/dL — ABNORMAL LOW (ref 8.4–10.5)
Chloride: 98 mEq/L (ref 96–112)
Chloride: 95 mEq/L — ABNORMAL LOW (ref 96–112)
Creatinine, Ser: 1.04 mg/dL (ref 0.50–1.35)
GFR calc Af Amer: >90 mL/min (ref >90)
GFR calc Af Amer: >90 mL/min (ref >90)
GFR calc non Af Amer: 84 mL/min — ABNORMAL LOW (ref >90)
Glucose, Bld: 131 mg/dL — ABNORMAL HIGH (ref 70–99)
Potassium: 4.7 mEq/L (ref 3.5–5.1)
Potassium: 4.5 mEq/L (ref 3.5–5.1)
Sodium: 132 mEq/L — ABNORMAL LOW (ref 135–145)

## 2012-04-24 LAB — GLUCOSE, CAPILLARY
Glucose-Capillary: 132 mg/dL — ABNORMAL HIGH (ref 70–99)
Glucose-Capillary: 104 mg/dL — ABNORMAL HIGH (ref 70–99)

## 2012-04-24 MED ORDER — INSULIN ASPART 100 UNIT/ML ~~LOC~~ SOLN: 0.0000 [IU] | Freq: Three times a day (TID) | SUBCUTANEOUS | Status: DC

## 2012-04-24 MED ORDER — METOPROLOL TARTRATE 25 MG PO TABS
25.0000 mg | ORAL_TABLET | Freq: Two times a day (BID) | ORAL | Status: DC
  Administered 2012-04-24 – 2012-05-03 (×11): 25 mg via ORAL
  Filled 2012-04-24 (×19): qty 1

## 2012-04-24 MED ORDER — FUROSEMIDE 10 MG/ML IJ SOLN
40.0000 mg | Freq: Once | INTRAMUSCULAR | Status: AC
  Administered 2012-04-24: 40 mg via INTRAVENOUS

## 2012-04-24 MED ORDER — AMIODARONE LOAD VIA INFUSION
150.0000 mg | Freq: Once | INTRAVENOUS | Status: AC
  Administered 2012-04-24: 150 mg via INTRAVENOUS

## 2012-04-24 MED ORDER — METOPROLOL TARTRATE 25 MG/10 ML ORAL SUSPENSION
25.0000 mg | Freq: Two times a day (BID) | ORAL | Status: DC
  Filled 2012-04-24 (×7): qty 10

## 2012-04-24 MED ORDER — WARFARIN SODIUM 2.5 MG PO TABS
2.5000 mg | ORAL_TABLET | Freq: Every day | ORAL | Status: AC
  Administered 2012-04-24: 2.5 mg via ORAL
  Filled 2012-04-24: qty 1

## 2012-04-24 MED FILL — Heparin Sodium (Porcine) Inj 1000 Unit/ML: INTRAMUSCULAR | Qty: 90 | Status: AC

## 2012-04-24 MED FILL — Sodium Bicarbonate IV Soln 8.4%: INTRAVENOUS | Qty: 200 | Status: AC

## 2012-04-24 MED FILL — Lidocaine HCl IV Inj 20 MG/ML: INTRAVENOUS | Qty: 10 | Status: AC

## 2012-04-24 MED FILL — Electrolyte-R (PH 7.4) Solution: INTRAVENOUS | Qty: 5000 | Status: AC

## 2012-04-24 MED FILL — Mannitol IV Soln 20%: INTRAVENOUS | Qty: 500 | Status: AC

## 2012-04-24 MED FILL — Sodium Chloride IV Soln 0.9%: INTRAVENOUS | Qty: 4000 | Status: AC

## 2012-04-24 MED FILL — Heparin Sodium (Porcine) Inj 1000 Unit/ML: INTRAMUSCULAR | Qty: 10 | Status: AC

## 2012-04-24 MED FILL — Sodium Chloride Irrigation Soln 0.9%: Qty: 3000 | Status: AC

## 2012-04-24 MED FILL — Calcium Chloride Inj 10%: INTRAVENOUS | Qty: 20 | Status: AC

## 2012-04-24 NOTE — Progress Notes (Signed)
Patient ID: Jon [name], male   DOB: Jun 10, 1965, 46 y.o.   MRN: [id number]  Hemodynamically stable, sinue with pac's on amio.  Urine output good  CBC    Component Value Date/Time   WBC 16.5* 04/24/2012 0420   RBC 2.46* 04/24/2012 0420   HGB 7.5* 04/24/2012 0420   HCT 21.3* 04/24/2012 0420   PLT 31* 04/24/2012 0420   MCV 86.6 04/24/2012 0420   MCH 30.5 04/24/2012 0420   MCHC 35.2 04/24/2012 0420   RDW 15.2 04/24/2012 0420   LYMPHSABS 1.1 04/22/2012 0142   MONOABS 0.6 04/22/2012 0142   EOSABS 0.0 04/22/2012 0142   BASOSABS 0.0 04/22/2012 0142    BMET    Component Value Date/Time   NA 132* 04/24/2012 1819   K 4.5 04/24/2012 1819   CL 95* 04/24/2012 1819   CO2 27 04/24/2012 1819   GLUCOSE 131* 04/24/2012 1819   BUN 34* 04/24/2012 1819   CREATININE 1.04 04/24/2012 1819   CALCIUM 7.9* 04/24/2012 1819   GFRNONAA 84* 04/24/2012 1819   GFRAA >90 04/24/2012 1819    A/P:  Stable. Continue present course.

## 2012-04-24 NOTE — Progress Notes (Addendum)
2 Days Post-Op Procedure(s) (LRB): THORACIC ASCENDING ANEURYSM REPAIR (AAA) (N/A) CORONARY ARTERY BYPASS GRAFTING (CABG) (N/A) INTRAOPERATIVE TRANSESOPHAGEAL ECHOCARDIOGRAM (N/A) Subjective:  Jon [name] complains of some pain this morning and is questioning when his chest tubes will be removed.  Objective: Vital signs in last 24 hours: Temp:  [97.6 F (36.4 C)-99.9 F (37.7 C)] 97.6 F (36.4 C) (12/19 0823) Pulse Rate:  [85-101] 90  (12/19 0700) Cardiac Rhythm:  [-] Atrial paced (12/18 2100) Resp:  [10-23] 21  (12/19 0700) BP: (85-113)/(43-73) 108/73 mmHg (12/19 0600) SpO2:  [95 %-100 %] 96 % (12/19 0700) Arterial Line BP: (91-124)/(40-64) 112/56 mmHg (12/19 0700) FiO2 (%):  [40 %] 40 % (12/18 0936) Weight:  [188 lb 0.8 oz (85.3 kg)] 188 lb 0.8 oz (85.3 kg) (12/19 0500)  Hemodynamic parameters for last 24 hours: PAP: (14-28)/(1-15) 24/12 mmHg  Intake/Output from previous day: 12/18 0701 - 12/19 0700 In: 2536.3 [P.O.:720; I.V.:1562.3; IV Piggyback:254] Out: 3940 [Urine:2700; Chest Tube:1240]  General appearance: alert, cooperative and no distress Heart: regular rate and rhythm Lungs: diminished breath sounds right Abdomen: soft, non-tender; bowel sounds normal; no masses,  no organomegaly Extremities: edema trace Wound: clean and dry  Lab Results:  Basename 04/24/12 0420 04/23/12 1728 04/23/12 1700  WBC 16.5* -- 17.3*  HGB 7.5* 7.1* --  HCT 21.3* 21.0* --  PLT 31* -- 34*   BMET:  Basename 04/24/12 0420 04/23/12 1728 04/23/12 0420  NA 133* 136 --  K 4.7 4.4 --  CL 98 99 --  CO2 28 -- 27  GLUCOSE 134* 139* --  BUN 26* 21 --  CREATININE 0.93 0.80 --  CALCIUM 8.2* -- 8.1*    PT/INR:  Basename 04/22/12 1539  LABPROT 11.8  INR 0.87   ABG    Component Value Date/Time   PHART 7.378 04/23/2012 1111   HCO3 26.5* 04/23/2012 1111   TCO2 27 04/23/2012 1728   ACIDBASEDEF 6.0* 04/22/2012 1218   O2SAT 93.0 04/23/2012 1111   CBG (last 3)   Basename 04/24/12  0422 04/23/12 2356 04/23/12 2030  GLUCAP 132* 125* 100*    Assessment/Plan: S/P Procedure(s) (LRB): THORACIC ASCENDING ANEURYSM REPAIR (AAA) (N/A) CORONARY ARTERY BYPASS GRAFTING (CABG) (N/A) INTRAOPERATIVE TRANSESOPHAGEAL ECHOCARDIOGRAM (N/A)  1. CV- NSR, pacer on back up- on IV amiodarone, possibly transition to oral regimen, continue Lopressor 2. Pulm- right sided atelectasis continue aggressive IS, Chest tubes with 1240cc output yesterday, likely leave in place 3. Acute Post operative anemia- Hgb 7.5 this morning, has been trending down, possibly benefit from transfusion 4. Thrombocytopenia- will continue to monitor, off heparin and lovenox 5. Volume overload- weight slightly up, will diurese  6. INR- no checked since 17th- will order coumadin 2.5mg  tonight get PT/INR in AM    LOS: 2 days    BARRETT, Jon [name] 04/24/2012   Overall doing well C/o some blurry vision, no diplopia, can identify friends' faces, no obvious visual field defect Mobilize Still draining a fair amount from CT - will leave in for now

## 2012-04-25 ENCOUNTER — Inpatient Hospital Stay (HOSPITAL_COMMUNITY): Payer: Medicaid Other

## 2012-04-25 LAB — BASIC METABOLIC PANEL
BUN: 46 mg/dL — ABNORMAL HIGH (ref 6–23)
CO2: 24 mEq/L (ref 19–32)
CO2: 26 mEq/L (ref 19–32)
Calcium: 7.5 mg/dL — ABNORMAL LOW (ref 8.4–10.5)
Chloride: 93 mEq/L — ABNORMAL LOW (ref 96–112)
Creatinine, Ser: 1.59 mg/dL — ABNORMAL HIGH (ref 0.50–1.35)
Creatinine, Ser: 1.18 mg/dL (ref 0.50–1.35)
GFR calc non Af Amer: 72 mL/min — ABNORMAL LOW (ref >90)
Glucose, Bld: 132 mg/dL — ABNORMAL HIGH (ref 70–99)
Glucose, Bld: 115 mg/dL — ABNORMAL HIGH (ref 70–99)
Potassium: 4.6 mEq/L (ref 3.5–5.1)

## 2012-04-25 LAB — PROTIME-INR: INR: 1.34 (ref 0.00–1.49)

## 2012-04-25 LAB — GLUCOSE, CAPILLARY
Glucose-Capillary: 101 mg/dL — ABNORMAL HIGH (ref 70–99)
Glucose-Capillary: 119 mg/dL — ABNORMAL HIGH (ref 70–99)

## 2012-04-25 LAB — CBC
HCT: 25.2 % — ABNORMAL LOW (ref 39.0–52.0)
Hemoglobin: 8.8 g/dL — ABNORMAL LOW (ref 13.0–17.0)
MCV: 87.8 fL (ref 78.0–100.0)
RDW: 15 % (ref 11.5–15.5)
WBC: 17 10*3/uL — ABNORMAL HIGH (ref 4.0–10.5)

## 2012-04-25 MED ORDER — WARFARIN SODIUM 5 MG PO TABS
5.0000 mg | ORAL_TABLET | Freq: Every day | ORAL | Status: DC
  Administered 2012-04-25 – 2012-04-27 (×3): 5 mg via ORAL
  Filled 2012-04-25 (×4): qty 1

## 2012-04-25 MED ORDER — LEVALBUTEROL HCL 0.63 MG/3ML IN NEBU
0.6300 mg | INHALATION_SOLUTION | Freq: Four times a day (QID) | RESPIRATORY_TRACT | Status: DC | PRN
  Administered 2012-04-25 – 2012-05-03 (×11): 0.63 mg via RESPIRATORY_TRACT
  Filled 2012-04-25 (×4): qty 3

## 2012-04-25 MED ORDER — WARFARIN SODIUM 2.5 MG PO TABS
2.5000 mg | ORAL_TABLET | Freq: Every day | ORAL | Status: DC
  Filled 2012-04-25: qty 1

## 2012-04-25 MED ORDER — INSULIN ASPART 100 UNIT/ML ~~LOC~~ SOLN: 0.0000 [IU] | Freq: Three times a day (TID) | SUBCUTANEOUS | Status: DC

## 2012-04-25 MED ORDER — METOCLOPRAMIDE HCL 5 MG/ML IJ SOLN
10.0000 mg | Freq: Four times a day (QID) | INTRAMUSCULAR | Status: AC
  Administered 2012-04-25 – 2012-04-26 (×4): 10 mg via INTRAVENOUS
  Filled 2012-04-25 (×4): qty 2

## 2012-04-25 NOTE — Progress Notes (Signed)
3 Days Post-Op Procedure(s) (LRB): THORACIC ASCENDING ANEURYSM REPAIR (AAA) (N/A) CORONARY ARTERY BYPASS GRAFTING (CABG) (N/A) INTRAOPERATIVE TRANSESOPHAGEAL ECHOCARDIOGRAM (N/A) Subjective: Ambulating in hall Creat improved  Objective: Vital signs in last 24 hours: Temp:  [97.7 F (36.5 C)-98.1 F (36.7 C)] 97.7 F (36.5 C) (12/20 1708) Pulse Rate:  [59-91] 73  (12/20 1900) Cardiac Rhythm:  [-] Normal sinus rhythm (12/20 0800) Resp:  [10-24] 24  (12/20 1900) BP: (86-124)/(44-79) 123/67 mmHg (12/20 1900) SpO2:  [94 %-100 %] 98 % (12/20 1900) Weight:  [187 lb 9.8 oz (85.1 kg)] 187 lb 9.8 oz (85.1 kg) (12/20 0600)  Hemodynamic parameters for last 24 hours:   stable Intake/Output from previous day: 12/19 0701 - 12/20 0700 In: 1482.8 [P.O.:600; I.V.:880.8; IV Piggyback:2] Out: 2340 [Urine:1540; Chest Tube:800] Intake/Output this shift:      Lab Results:  Basename 04/25/12 0420 04/24/12 0420  WBC 17.0* 16.5*  HGB 8.8* 7.5*  HCT 25.2* 21.3*  PLT 51* 31*   BMET:  Basename 04/25/12 1756 04/25/12 0420  NA 128* 131*  K 4.6 4.6  CL 91* 93*  CO2 26 24  GLUCOSE 115* 132*  BUN 40* 46*  CREATININE 1.18 1.59*  CALCIUM 7.5* 7.8*    PT/INR:  Basename 04/25/12 0420  LABPROT 16.3*  INR 1.34   ABG    Component Value Date/Time   PHART 7.378 04/23/2012 1111   HCO3 26.5* 04/23/2012 1111   TCO2 27 04/23/2012 1728   ACIDBASEDEF 6.0* 04/22/2012 1218   O2SAT 93.0 04/23/2012 1111   CBG (last 3)   Basename 04/25/12 1705 04/25/12 1208 04/25/12 0735  GLUCAP 101* 91 117*    Assessment/Plan: S/P Procedure(s) (LRB): THORACIC ASCENDING ANEURYSM REPAIR (AAA) (N/A) CORONARY ARTERY BYPASS GRAFTING (CABG) (N/A) INTRAOPERATIVE TRANSESOPHAGEAL ECHOCARDIOGRAM (N/A) Patient examined and record reviewed.Hemodynamics stable,labs satisfactory.Patient had stable day.Continue current care. VAN TRIGT III,Badr Piedra 04/25/2012      LOS: 3 days    VAN TRIGT III,Jkwon Treptow 04/25/2012

## 2012-04-25 NOTE — Anesthesia Postprocedure Evaluation (Addendum)
  Anesthesia Post-op Note  Patient: Jon [name]  Procedure(s) Performed: Procedure(s) (LRB) with comments: THORACIC ASCENDING ANEURYSM REPAIR (AAA) (N/A) CORONARY ARTERY BYPASS GRAFTING (CABG) (N/A) - times one with right greater saphenous vein graft INTRAOPERATIVE TRANSESOPHAGEAL ECHOCARDIOGRAM (N/A)  Patient Location: SICU  Anesthesia Type:General  Level of Consciousness: awake, sedated and pateint uncooperative  Airway and Oxygen Therapy: spontaneous breathing, room air  Post-op Pain: mild  Post-op Assessment: Post-op Vital signs reviewed and Patient's Cardiovascular Status Stable Nausea and vomiting present. Pain well controlled.  Respiratory status stable, patent airway.  Post-op Vital Signs: Reviewed and stable  Complications: No apparent anesthesia complications

## 2012-04-25 NOTE — Progress Notes (Addendum)
3 Days Post-Op Procedure(s) (LRB): THORACIC ASCENDING ANEURYSM REPAIR (AAA) (N/A) CORONARY ARTERY BYPASS GRAFTING (CABG) (N/A) INTRAOPERATIVE TRANSESOPHAGEAL ECHOCARDIOGRAM (N/A) Subjective:  Mr. Adkins states he doesn't feel all that great this morning.  He continues to have blurry vision, but states it has not gotten any worse.  He is not ambulating yet.  No BM  Objective: Vital signs in last 24 hours: Temp:  [97.5 F (36.4 C)-98.1 F (36.7 C)] 98 F (36.7 C) (12/20 0736) Pulse Rate:  [59-91] 59  (12/20 0600) Cardiac Rhythm:  [-] Ventricular paced (12/20 0600) Resp:  [10-24] 17  (12/20 0600) BP: (86-112)/(49-79) 104/53 mmHg (12/20 0600) SpO2:  [94 %-100 %] 100 % (12/20 0757) Arterial Line BP: (106-112)/(54-56) 112/56 mmHg (12/19 0942) Weight:  [187 lb 9.8 oz (85.1 kg)] 187 lb 9.8 oz (85.1 kg) (12/20 0600)  Intake/Output from previous day: 12/19 0701 - 12/20 0700 In: 1482.8 [P.O.:600; I.V.:880.8; IV Piggyback:2] Out: 2340 [Urine:1540; Chest Tube:800] Intake/Output this shift: Total I/O In: 36.7 [I.V.:36.7] Out: 50 [Chest Tube:50]  General appearance: alert, cooperative and no distress Heart: regular rate and rhythm Lungs: clear to auscultation bilaterally Abdomen: soft, non-tender; bowel sounds normal; no masses,  no organomegaly Extremities: extremities normal, atraumatic, no cyanosis or edema Wound: clean and dry  Lab Results:  [facility] 04/25/12 0420 04/24/12 0420  WBC 17.0* 16.5*  HGB 8.8* 7.5*  HCT 25.2* 21.3*  PLT 51* 31*   BMET:  Basename 04/25/12 0420 04/24/12 1819  NA 131* 132*  K 4.6 4.5  CL 93* 95*  CO2 24 27  GLUCOSE 132* 131*  BUN 46* 34*  CREATININE 1.59* 1.04  CALCIUM 7.8* 7.9*    PT/INR:  Basename 04/25/12 0420  LABPROT 16.3*  INR 1.34   ABG    Component Value Date/Time   PHART 7.378 04/23/2012 1111   HCO3 26.5* 04/23/2012 1111   TCO2 27 04/23/2012 1728   ACIDBASEDEF 6.0* 04/22/2012 1218   O2SAT 93.0 04/23/2012 1111   CBG (last  3)   Basename 04/25/12 0735 04/24/12 2224 04/24/12 1215  GLUCAP 117* 129* 104*    Assessment/Plan: S/P Procedure(s) (LRB): THORACIC ASCENDING ANEURYSM REPAIR (AAA) (N/A) CORONARY ARTERY BYPASS GRAFTING (CABG) (N/A) INTRAOPERATIVE TRANSESOPHAGEAL ECHOCARDIOGRAM (N/A)  1. CV- NSR with PACs, on IV Amiodarone, Lopressor 2. Pulm-atelectasis bilaterally, no pleural effusions on CXR, Chest tubes with 800cc output 3. Renal- creatinine trending up 1.59 today, will follow, patient with good urinary output 4. Acute post operative anemia- improving, Hgb 8.8 this morning 5. Thrombocytopenia- improving, patient not receiving Heparin or Lovenox 6. INR 1.34 this morning, on 2.5mg  Coumadin 7. Dispo- chest tubes still with high output, likely leave in place today, Central line not functioning per nursing, patient has peripheral access will disontinue, patient stable, ?possible transfer to stepdown?   LOS: 3 days    BARRETT, ERIN 04/25/2012   Patient complained of nausea after breakfast then had a large amount of emesis. Will make NPO for now IVF D/C CT- still draining a fair amount but not more than expected Given nausea, vomiting and increased creatinine will keep in SICU today Still complaining of blurred vision up close, "can see fine at a distance"- will get noncontrast  Head CT

## 2012-04-25 NOTE — Progress Notes (Addendum)
NUTRITION FOLLOW UP  Intervention:    Advance diet as medically appropriate  RD to follow for nutrition care plan  Nutrition Dx:   Increased nutrient needs related to post-op healing as evidenced by estimated nutrition needs, ongoing  Goal:   Oral intake with meals & supplements to meet >/= 90% of estimated nutrition needs, currently unmet  Monitor:   PO diet advancement & intake, weight, labs, I/O's  Assessment:   Patient with nausea this AM after breakfast (Carbohydrate Modified Medium Calorie diet) with large amount of emesis afterwards.  Made NPO.  Resource Breeze supplements in place -- will keep in active orders for now -- hopefully N/V will resolve soon.  Overall doing well post-op.  Height: Ht Readings from Last 1 Encounters:  04/23/12 5\' 8"  (1.727 m)    Weight Status:   Wt Readings from Last 1 Encounters:  04/25/12 187 lb 9.8 oz (85.1 kg)    Re-estimated needs:  Kcal: 2100-2300 Protein: 115-125 gm Fluid: 2.1-2.3 L  Skin: Intact  Diet Order: NPO   Intake/Output Summary (Last 24 hours) at 04/25/12 1323 Last data filed at 04/25/12 1000  Gross per 24 hour  Intake 1332.7 ml  Output   2030 ml  Net -697.3 ml    Labs:   Lab 04/25/12 0420 04/24/12 1819 04/24/12 0420 04/23/12 1700 04/23/12 0420 04/22/12 1950  NA 131* 132* 133* -- -- --  K 4.6 4.5 4.7 -- -- --  CL 93* 95* 98 -- -- --  CO2 24 27 28  -- -- --  BUN 46* 34* 26* -- -- --  CREATININE 1.59* 1.04 0.93 -- -- --  CALCIUM 7.8* 7.9* 8.2* -- -- --  MG -- -- -- 1.8 1.8 2.4  PHOS -- -- -- -- -- --  GLUCOSE 132* 131* 134* -- -- --    CBG (last 3)   Basename 04/25/12 1208 04/25/12 0735 04/24/12 2224  GLUCAP 91 117* 129*    Scheduled Meds:   . acetaminophen  1,000 mg Oral Q6H   Or  . acetaminophen (TYLENOL) oral liquid 160 mg/5 mL  975 mg Per Tube Q6H  . aspirin EC  325 mg Oral Daily   Or  . aspirin  324 mg Per Tube Daily  . bisacodyl  10 mg Oral Daily   Or  . bisacodyl  10 mg Rectal Daily   . docusate sodium  200 mg Oral Daily  . feeding supplement  1 Container Oral TID BM  . insulin aspart  0-15 Units Subcutaneous TID WC  . levalbuterol  0.63 mg Nebulization Q6H  . metoCLOPramide (REGLAN) injection  10 mg Intravenous Q6H  . metoprolol tartrate  25 mg Oral BID   Or  . metoprolol tartrate  25 mg Per Tube BID  . pantoprazole  40 mg Oral Daily  . sodium chloride  3 mL Intravenous Q12H  . warfarin  2.5 mg Oral q1800  . warfarin   Does not apply Once  . Warfarin - Physician Dosing Inpatient   Does not apply q1800    Continuous Infusions:   . sodium chloride 100 mL/hr at 04/25/12 1000  . sodium chloride    . sodium chloride    . amiodarone (NEXTERONE PREMIX) 360 mg/200 mL dextrose 30 mg/hr (04/25/12 1200)    Kirkland Hun, RD, LDN Pager #: [phone] After-Hours Pager #: [phone]

## 2012-04-26 LAB — TYPE AND SCREEN
Unit division: 0
Unit division: 0
Unit division: 0
Unit division: 0
Unit division: 0
Unit division: 0
Unit division: 0

## 2012-04-26 LAB — CBC
MCV: 87.2 fL (ref 78.0–100.0)
Platelets: 84 10*3/uL — ABNORMAL LOW (ref 150–400)
RDW: 14.8 % (ref 11.5–15.5)
WBC: 13.3 10*3/uL — ABNORMAL HIGH (ref 4.0–10.5)

## 2012-04-26 LAB — COMPREHENSIVE METABOLIC PANEL
Alkaline Phosphatase: 144 U/L — ABNORMAL HIGH (ref 39–117)
BUN: 36 mg/dL — ABNORMAL HIGH (ref 6–23)
CO2: 26 mEq/L (ref 19–32)
Chloride: 92 mEq/L — ABNORMAL LOW (ref 96–112)
GFR calc Af Amer: >90 mL/min (ref >90)
GFR calc non Af Amer: 82 mL/min — ABNORMAL LOW (ref >90)
Glucose, Bld: 106 mg/dL — ABNORMAL HIGH (ref 70–99)
Potassium: 4.4 mEq/L (ref 3.5–5.1)
Total Bilirubin: 1.4 mg/dL — ABNORMAL HIGH (ref 0.3–1.2)

## 2012-04-26 LAB — GLUCOSE, CAPILLARY
Glucose-Capillary: 107 mg/dL — ABNORMAL HIGH (ref 70–99)
Glucose-Capillary: 166 mg/dL — ABNORMAL HIGH (ref 70–99)

## 2012-04-26 MED ORDER — DM-GUAIFENESIN ER 30-600 MG PO TB12
1.0000 | ORAL_TABLET | Freq: Two times a day (BID) | ORAL | Status: DC | PRN
  Administered 2012-04-26 – 2012-05-03 (×5): 1 via ORAL
  Filled 2012-04-26 (×7): qty 1

## 2012-04-26 MED ORDER — FE FUMARATE-B12-VIT C-FA-IFC PO CAPS
1.0000 | ORAL_CAPSULE | Freq: Three times a day (TID) | ORAL | Status: DC
  Administered 2012-04-26 – 2012-05-03 (×21): 1 via ORAL
  Filled 2012-04-26 (×26): qty 1

## 2012-04-26 MED ORDER — TRAMADOL HCL 50 MG PO TABS
100.0000 mg | ORAL_TABLET | Freq: Four times a day (QID) | ORAL | Status: DC | PRN
  Administered 2012-04-26 – 2012-05-01 (×11): 100 mg via ORAL
  Filled 2012-04-26 (×12): qty 2

## 2012-04-26 MED ORDER — SODIUM CHLORIDE 0.45 % IV SOLN
INTRAVENOUS | Status: DC
  Administered 2012-04-26: 100 mL/h via INTRAVENOUS

## 2012-04-26 MED ORDER — MENTHOL 3 MG MT LOZG
1.0000 | LOZENGE | OROMUCOSAL | Status: DC | PRN
  Administered 2012-05-03: 3 mg via ORAL
  Filled 2012-04-26 (×2): qty 9

## 2012-04-26 MED ORDER — AMIODARONE HCL 200 MG PO TABS
400.0000 mg | ORAL_TABLET | Freq: Two times a day (BID) | ORAL | Status: DC
  Administered 2012-04-26 – 2012-05-03 (×15): 400 mg via ORAL
  Filled 2012-04-26 (×17): qty 2

## 2012-04-26 NOTE — Progress Notes (Signed)
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 angulating and hallways Stable hemodynamics and pulse  Objective: Vital signs in last 24 hours: Temp:  [97.2 F (36.2 C)-97.6 F (36.4 C)] 97.6 F (36.4 C) (12/21 1618) Pulse Rate:  [59-76] 72  (12/21 1700) Cardiac Rhythm:  [-] Normal sinus rhythm (12/21 0800) Resp:  [16-34] 26  (12/21 1700) BP: (88-129)/(53-83) 129/72 mmHg (12/21 1700) SpO2:  [90 %-99 %] 91 % (12/21 1700) Weight:  [188 lb 4.4 oz (85.4 kg)] 188 lb 4.4 oz (85.4 kg) (12/21 0600)  Hemodynamic parameters for last 24 hours:   stable sinus some sinus tachycardia  Intake/Output from previous day: 12/20 0701 - 12/21 0700 In: 3130.7 [P.O.:720; I.V.:2410.7] Out: 2280 [Urine:2150; Chest Tube:130] Intake/Output this shift: Total I/O In: 480 [P.O.:480] Out: 1625 [Urine:1625]  Good pulses no focal weakness  Lab Results:  Basename 04/26/12 0439 04/25/12 0420  WBC 13.3* 17.0*  HGB 8.1* 8.8*  HCT 23.2* 25.2*  PLT 84* 51*   BMET:  Basename 04/26/12 0439 04/25/12 1756  NA 130* 128*  K 4.4 4.6  CL 92* 91*  CO2 26 26  GLUCOSE 106* 115*  BUN 36* 40*  CREATININE 1.07 1.18  CALCIUM 7.6* 7.5*    PT/INR:  Basename 04/26/12 0439  LABPROT 18.1*  INR 1.55*   ABG    Component Value Date/Time   PHART 7.378 04/23/2012 1111   HCO3 26.5* 04/23/2012 1111   TCO2 27 04/23/2012 1728   ACIDBASEDEF 6.0* 04/22/2012 1218   O2SAT 93.0 04/23/2012 1111   CBG (last 3)   Basename 04/26/12 1613 04/26/12 1148 04/26/12 0840  GLUCAP 107* 99 108*    Assessment/Plan: S/P Procedure(s) (LRB): THORACIC ASCENDING ANEURYSM REPAIR (AAA) (N/A) CORONARY ARTERY BYPASS GRAFTING (CABG) (N/A) INTRAOPERATIVE TRANSESOPHAGEAL ECHOCARDIOGRAM (N/A) Hope to transfer to step down soon   LOS: 4  days    VAN TRIGT III,Chosen Geske 04/26/2012

## 2012-04-26 NOTE — Progress Notes (Signed)
4 Days Post-Op Procedure(s) (LRB): THORACIC ASCENDING ANEURYSM REPAIR (AAA) (N/A) CORONARY ARTERY BYPASS GRAFTING (CABG) (N/A) INTRAOPERATIVE TRANSESOPHAGEAL ECHOCARDIOGRAM (N/A) Subjective: No nausea, [place] NSR on IV amio    coumadin for mech valve  Objective: Vital signs in last 24 hours: Temp:  [97.2 F (36.2 C)-97.7 F (36.5 C)] 97.2 F (36.2 C) (12/20 2000) Pulse Rate:  [59-91] 63  (12/21 0700) Cardiac Rhythm:  [-] Atrial fibrillation;Ventricular paced (12/21 0300) Resp:  [15-34] 17  (12/21 0700) BP: (88-124)/(44-83) 123/66 mmHg (12/21 0600) SpO2:  [91 %-100 %] 94 % (12/21 0700) Weight:  [188 lb 4.4 oz (85.4 kg)] 188 lb 4.4 oz (85.4 kg) (12/21 0600)  Hemodynamic parameters for last 24 hours:  nsr  Intake/Output from previous day: 12/20 0701 - 12/21 0700 In: 3130.7 [P.O.:720; I.V.:2410.7] Out: 2280 [Urine:2150; Chest Tube:130] Intake/Output this shift:    Lungs clear  Lab Results:  [facility] 04/26/12 0439 04/25/12 0420  WBC 13.3* 17.0*  HGB 8.1* 8.8*  HCT 23.2* 25.2*  PLT 84* 51*   BMET:  Basename 04/26/12 0439 04/25/12 1756  NA 130* 128*  K 4.4 4.6  CL 92* 91*  CO2 26 26  GLUCOSE 106* 115*  BUN 36* 40*  CREATININE 1.07 1.18  CALCIUM 7.6* 7.5*    PT/INR:  Basename 04/26/12 0439  LABPROT 18.1*  INR 1.55*   ABG    Component Value Date/Time   PHART 7.378 04/23/2012 1111   HCO3 26.5* 04/23/2012 1111   TCO2 27 04/23/2012 1728   ACIDBASEDEF 6.0* 04/22/2012 1218   O2SAT 93.0 04/23/2012 1111   CBG (last 3)   Basename 04/25/12 2221 04/25/12 1705 04/25/12 1208  GLUCAP 119* 101* 91    Assessment/Plan: S/P Procedure(s) (LRB): THORACIC ASCENDING ANEURYSM REPAIR (AAA) (N/A) CORONARY ARTERY BYPASS GRAFTING (CABG) (N/A) INTRAOPERATIVE TRANSESOPHAGEAL ECHOCARDIOGRAM (N/A) See progression orders Follow low platelets  LOS: 4 days    VAN TRIGT III,Mariann Palo 04/26/2012

## 2012-04-27 ENCOUNTER — Inpatient Hospital Stay (HOSPITAL_COMMUNITY): Payer: Medicaid Other

## 2012-04-27 LAB — GLUCOSE, CAPILLARY: Glucose-Capillary: 102 mg/dL — ABNORMAL HIGH (ref 70–99)

## 2012-04-27 LAB — PROTIME-INR: INR: 1.77 — ABNORMAL HIGH (ref 0.00–1.49)

## 2012-04-27 LAB — BASIC METABOLIC PANEL
BUN: 29 mg/dL — ABNORMAL HIGH (ref 6–23)
CO2: 26 mEq/L (ref 19–32)
Calcium: 7.5 mg/dL — ABNORMAL LOW (ref 8.4–10.5)
Chloride: 89 mEq/L — ABNORMAL LOW (ref 96–112)
Creatinine, Ser: 0.91 mg/dL (ref 0.50–1.35)
GFR calc Af Amer: >90 mL/min (ref >90)
GFR calc non Af Amer: >90 mL/min (ref >90)
Glucose, Bld: 105 mg/dL — ABNORMAL HIGH (ref 70–99)
Potassium: 4.5 mEq/L (ref 3.5–5.1)
Sodium: 125 mEq/L — ABNORMAL LOW (ref 135–145)

## 2012-04-27 LAB — CBC
HCT: 23.1 % — ABNORMAL LOW (ref 39.0–52.0)
Hemoglobin: 8 g/dL — ABNORMAL LOW (ref 13.0–17.0)
MCH: 30.8 pg (ref 26.0–34.0)
MCHC: 34.6 g/dL (ref 30.0–36.0)
MCV: 88.8 fL (ref 78.0–100.0)
Platelets: 105 10*3/uL — ABNORMAL LOW (ref 150–400)
RBC: 2.6 MIL/uL — ABNORMAL LOW (ref 4.22–5.81)
RDW: 14.8 % (ref 11.5–15.5)
WBC: 16.7 10*3/uL — ABNORMAL HIGH (ref 4.0–10.5)

## 2012-04-27 MED ORDER — LEVOFLOXACIN 500 MG PO TABS
500.0000 mg | ORAL_TABLET | Freq: Every day | ORAL | Status: DC
  Administered 2012-04-27 – 2012-05-03 (×7): 500 mg via ORAL
  Filled 2012-04-27 (×7): qty 1

## 2012-04-27 NOTE — Evaluation (Signed)
Clinical/Bedside Swallow Evaluation Patient Details  Name: Jon [name] MRN: [id number] Date of Birth: January 14, 1966  Today's Date: 04/27/2012 Time: 1130-1215 SLP Time Calculation (min): 45 min  Past Medical History:  Past Medical History  Diagnosis Date  . Anxiety    Past Surgical History:  Past Surgical History  Procedure Date  . Cardiac catheterization   . Rotator cuff repair     12 years ago, Left  . Thoracic aortic aneurysm repair 04/22/2012    Procedure: THORACIC ASCENDING ANEURYSM REPAIR (AAA);  Surgeon: [name], MD;  Location: [facility] OR;  Service: Open Heart Surgery;  Laterality: N/A;  . Coronary artery bypass graft 04/22/2012    Procedure: CORONARY ARTERY BYPASS GRAFTING (CABG);  Surgeon: [name], MD;  Location: [facility] OR;  Service: Open Heart Surgery;  Laterality: N/A;  times one with right greater saphenous vein graft  . Intraoperative transesophageal echocardiogram 04/22/2012    Procedure: INTRAOPERATIVE TRANSESOPHAGEAL ECHOCARDIOGRAM;  Surgeon: [name], MD;  Location: [facility] OR;  Service: Open Heart Surgery;  Laterality: N/A;   HPI:  46 y/o male with history of aortic stenosis since childhood who developed sudden onset of severe CP.  Patient went to [facility] ED where CT angiogram showed a Type A  dissection.  Patient transferred to [facility] for Thoracic ascending aneurysm repair, CABG and Intraopertive Transesophageal Echocardiogram.  CXR indicates Right lung infliltrate .  BSE ordered to assess risk for aspiration due to results of CXR.    Assessment / Plan / Recommendation Clinical Impression  Minimal reversable dysphagia with s/s of aspiraton after swallow of thin water by cup.  Vocal quality dysphonic so question possible vocal cord edema.  Aspiration s/s judged to be a result of slight delay in initiation paired with patient taking too big of sips at fast rate.  Recommend to continue current diet with aspiration precautions with intermittent  supervision to cue patient to adhere to aspiration precautions. ST to follow briefly in acute care setting for diet tolerance to ensure safety.     Aspiration Risk  Moderate    Diet Recommendation Regular;Thin liquid   Liquid Administration via: Cup;Straw Medication Administration: Whole meds with liquid Supervision: Patient able to self feed;Intermittent supervision to cue for compensatory strategies Compensations: Slow rate;Small sips/bites Postural Changes and/or Swallow Maneuvers: Out of bed for meals;Upright 30-60 min after meal    Other  Recommendations Oral Care Recommendations: Oral care BID Other Recommendations: Clarify dietary restrictions   Follow Up Recommendations  None    Frequency and Duration min 2x/week  2 weeks       SLP Swallow Goals Patient will consume recommended diet without observed clinical signs of aspiration with: Supervision/safety Patient will utilize recommended strategies during swallow to increase swallowing safety with: Supervision/safety   Swallow Study Prior Functional Status   Lived at home with no prior history of dysphagia     General Date of Onset: 04/22/12 HPI: 46 y/o male with history of aortic stenosis since childhood who developed sudden onset of severe CP.  Patient went to [facility] ED where CT angiogram showed a Type A  dissection.  Patient transferred to [facility] for Thoracic ascending aneurysm repair, CABG and Intraopertive Transesophageal Echocardiogram.  CXR indicates Right lung infliltrate .  BSE ordered to assess risk for aspiration due to results of CXR.  Type of Study: Bedside swallow evaluation Diet Prior to this Study: Regular;Thin liquids Respiratory Status: Room air History of Recent Intubation: Yes Length of Intubations (days):  (surgeries ) Behavior/Cognition: Alert;Agitated;Decreased  sustained attention Oral Cavity - Dentition: Adequate natural dentition Self-Feeding Abilities: Able to feed self Patient Positioning:  Upright in bed Baseline Vocal Quality: Hoarse;Low vocal intensity Volitional Cough: Strong Volitional Swallow: Able to elicit    Oral/Motor/Sensory Function Overall Oral Motor/Sensory Function: Appears within functional limits for tasks assessed   Ice Chips Ice chips: Not tested   Thin Liquid Thin Liquid: Impaired Presentation: Cup Pharyngeal  Phase Impairments: Suspected delayed Swallow;Cough - Immediate    Nectar Thick Nectar Thick Liquid: Not tested   Honey Thick Honey Thick Liquid: Not tested   Puree Puree: Within functional limits   Solid   GO  Moreen Fowler MS, CCC-SLP [phone] Solid: Within functional limits       [facility] 04/27/2012,5:24 PM

## 2012-04-27 NOTE — Progress Notes (Signed)
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 Postoperative visual disturbance, mild with negative head CT scan for stroke Oral Coumadin for mechanical valve INR gradually increasing Right lung infiltrate possible aspiration on oral Levaquin with swallow evaluation pending Surgical incisions healing well, patient ambulating300 feet easily Objective: Vital signs in last 24 hours: Temp:  [97.4 F (36.3 C)-98.4 F (36.9 C)] 97.4 F (36.3 C) (12/22 0737) Pulse Rate:  [55-75] 62  (12/22 0900) Cardiac Rhythm:  [-] Sinus bradycardia;Heart block (12/22 0800) Resp:  [16-28] 21  (12/22 0900) BP: (102-130)/(59-91) 120/68 mmHg (12/22 0900) SpO2:  [90 %-98 %] 96 % (12/22 0900) Weight:  [188 lb 7.9 oz (85.5 kg)] 188 lb 7.9 oz (85.5 kg) (12/22 0520)  Hemodynamic parameters for last 24 hours:   sinus rhythm on amiodarone  Intake/Output from previous day: 12/21 0701 - 12/22 0700 In: 2280 [P.O.:2280] Out: 2325 [Urine:2325] Intake/Output this shift: Total I/O In: 240 [P.O.:240] Out: 100 [Urine:100]  Lungs scattered rhonchi Aortic valve Sharp closure sound  Lab Results:  Basename 04/27/12 0430 04/26/12 0439  WBC 16.7* 13.3*  HGB 8.0* 8.1*  HCT 23.1* 23.2*  PLT 105* 84*   BMET:  Basename 04/27/12 0430 04/26/12 0439  NA 125* 130*  K 4.5 4.4  CL 89* 92*  CO2 26 26  GLUCOSE 105* 106*  BUN 29* 36*  CREATININE 0.91 1.07  CALCIUM 7.5* 7.6*    PT/INR:  Basename 04/27/12 0430  LABPROT 20.0*  INR 1.77*   ABG    Component Value Date/Time   PHART 7.378 04/23/2012 1111   HCO3 26.5* 04/23/2012 1111   TCO2 27 04/23/2012 1728   ACIDBASEDEF 6.0* 04/22/2012 1218   O2SAT 93.0 04/23/2012 1111   CBG (last 3)   Basename 04/27/12 0733 04/26/12 2233 04/26/12 1613   GLUCAP 102* 166* 107*    Assessment/Plan: S/P Procedure(s) (LRB): THORACIC ASCENDING ANEURYSM REPAIR (AAA) (N/A) CORONARY ARTERY BYPASS GRAFTING (CABG) (N/A) INTRAOPERATIVE TRANSESOPHAGEAL ECHOCARDIOGRAM (N/A) Plan for transfer to step-down: see transfer orders Transfer to regular bed on 2000 continue to monitor progress hopefully home with home health nurse, home INR drawn before Christmas Pro times probably will be monitored by T. CTS office as the patient has no primary care physician or cardiologist  LOS: 5 days    VAN TRIGT III,Teige Rountree 04/27/2012

## 2012-04-28 ENCOUNTER — Inpatient Hospital Stay (HOSPITAL_COMMUNITY): Payer: Medicaid Other

## 2012-04-28 LAB — CBC
HCT: 23.9 % — ABNORMAL LOW (ref 39.0–52.0)
Hemoglobin: 8.1 g/dL — ABNORMAL LOW (ref 13.0–17.0)
MCH: 30.7 pg (ref 26.0–34.0)
MCHC: 33.9 g/dL (ref 30.0–36.0)
MCV: 90.5 fL (ref 78.0–100.0)
Platelets: 143 10*3/uL — ABNORMAL LOW (ref 150–400)
RBC: 2.64 MIL/uL — ABNORMAL LOW (ref 4.22–5.81)
RDW: 15 % (ref 11.5–15.5)
WBC: 19.1 10*3/uL — ABNORMAL HIGH (ref 4.0–10.5)

## 2012-04-28 LAB — BASIC METABOLIC PANEL
BUN: 30 mg/dL — ABNORMAL HIGH (ref 6–23)
CO2: 23 mEq/L (ref 19–32)
Calcium: 7.7 mg/dL — ABNORMAL LOW (ref 8.4–10.5)
Chloride: 88 mEq/L — ABNORMAL LOW (ref 96–112)
Creatinine, Ser: 0.88 mg/dL (ref 0.50–1.35)
GFR calc Af Amer: >90 mL/min (ref >90)
GFR calc non Af Amer: >90 mL/min (ref >90)
Glucose, Bld: 170 mg/dL — ABNORMAL HIGH (ref 70–99)
Potassium: 4.1 mEq/L (ref 3.5–5.1)
Sodium: 125 mEq/L — ABNORMAL LOW (ref 135–145)

## 2012-04-28 LAB — PROTIME-INR: Prothrombin Time: 27.4 seconds — ABNORMAL HIGH (ref 11.6–15.2)

## 2012-04-28 MED ORDER — WARFARIN SODIUM 2.5 MG PO TABS
2.5000 mg | ORAL_TABLET | Freq: Every day | ORAL | Status: DC
  Filled 2012-04-28: qty 1

## 2012-04-28 NOTE — Progress Notes (Signed)
  Echocardiogram 2D Echocardiogram has been performed.  Ellender Hose A 04/28/2012, 3:23 PM

## 2012-04-28 NOTE — Progress Notes (Signed)
Pt attempted a walk this afternoon approx 25 feet with walker and two assist. He says he feels too bad and was very SOB, however O2 sats on RA was 96%. Pt also says he is having a hard time seeing still. Pt and wife very concerned and wish for an opthalmologist consult while in the hospital. Placed pt back to bed with call bed in reach and bed alarm on. Will continue to monitor.

## 2012-04-28 NOTE — Progress Notes (Addendum)
Speech Language Pathology Dysphagia Treatment Patient Details Name: Jon [name] MRN: [id number] DOB: [birth year] Today's Date: 04/28/2012 Time: 0920-0930 SLP Time Calculation (min): 10 min  Assessment / Plan / Recommendation Clinical Impression  F/u after yesterday's clinical swallow eval.  Pt continues with persisting s/s of possible aspiration when consuming large liquid boluses; voice remains hoarse; pt appears to be having difficulty managing secretions.  Immediate explosive coughing reduced with use of chin tuck and smaller bolus size.  Reviewed precautions with pt, who carried-them out and recalled with min cues.    Question vagal nerve involvement s/p surgery - may benefit from instrumental swallow prior to D/C.  Pt impulsive, asking repeatedly when he can be D/Cd home.      Diet Recommendation  Continue with Current Diet: Regular;Thin liquid with chin tuck   SLP Plan Continue with current plan of care      Swallowing Goals  SLP Swallowing Goals Patient will consume recommended diet without observed clinical signs of aspiration with: Supervision/safety Swallow Study Goal #1 - Progress: Progressing toward goal Patient will utilize recommended strategies during swallow to increase swallowing safety with: Supervision/safety Swallow Study Goal #2 - Progress: Progressing toward goal  General Temperature Spikes Noted: No Respiratory Status: Room air Behavior/Cognition: Alert;Impulsive;Decreased sustained attention Oral Cavity - Dentition: Adequate natural dentition Patient Positioning: Partially reclined  Oral Cavity - Oral Hygiene Does patient have any of the following "at risk" factors?: None of the above   Dysphagia Treatment Treatment focused on: Skilled observation of diet tolerance;Patient/family/caregiver education Treatment Methods/Modalities: Skilled observation Patient observed directly with PO's: Yes Type of PO's observed: Thin liquids Feeding: Able to feed  self Liquids provided via: Cup Pharyngeal Phase Signs & Symptoms: Immediate cough Type of cueing: Verbal Amount of cueing: Minimal   Jon [name], [place] CCC/SLP Pager [phone]      Jon [name] 04/28/2012, 9:38 AM

## 2012-04-28 NOTE — Progress Notes (Signed)
Changed pt's dressing on right thigh three times this afternoon. Dressing was completely saturated and pt's gown saturated from the site. Will continue to monitor.

## 2012-04-28 NOTE — Progress Notes (Signed)
Pt refused his walk this morning. States he feels "off" and knows he isn't remembering things correctly. He states he will wake up from a dream and find himself reaching for things in the air. Pt also states he is having difficulty seeing. MD was made aware this morning of the visual disturbances. Will see if pt is able to walk this afternoon. Pt has been up multiple times walking this morning to the restroom.

## 2012-04-28 NOTE — Progress Notes (Signed)
EPW removed per MD order. Pt tolerated well. Tips were intact, no bleeding noted. Pt is on bed rest for one hour with frequent vitals set up. Will continue to monitor.

## 2012-04-28 NOTE — Progress Notes (Addendum)
6 Days Post-Op Procedure(s) (LRB): THORACIC ASCENDING ANEURYSM REPAIR (AAA) (N/A) CORONARY ARTERY BYPASS GRAFTING (CABG) (N/A) INTRAOPERATIVE TRANSESOPHAGEAL ECHOCARDIOGRAM (N/A) Subjective:  Jon [name] really wants to be discharged home today.  He states he feels pretty good.  He is ambulating, +BM  Objective: Vital signs in last 24 hours: Temp:  [97.4 F (36.3 C)-98.7 F (37.1 C)] 97.4 F (36.3 C) (12/23 0416) Pulse Rate:  [50-64] 62  (12/23 0416) Cardiac Rhythm:  [-] Heart block (12/23 0811) Resp:  [18-21] 18  (12/23 0416) BP: (112-137)/(65-79) 132/73 mmHg (12/23 0416) SpO2:  [95 %-98 %] 97 % (12/23 0416) Weight:  [187 lb 3.2 oz (84.913 kg)] 187 lb 3.2 oz (84.913 kg) (12/23 0416)  Intake/Output from previous day: 12/22 0701 - 12/23 0700 In: 240 [P.O.:240] Out: 401 [Urine:400; Stool:1]  General appearance: alert, cooperative and no distress Heart: regular rate and rhythm Lungs: rhonchi throughout Abdomen: soft, non-tender; bowel sounds normal; no masses,  no organomegaly Extremities: edema trace Wound: RLE Thigh incision has dehisced, no drainage present, Sternotomy C/D/I  Lab Results:  Basename 04/28/12 0635 04/27/12 0430  WBC 19.1* 16.7*  HGB 8.1* 8.0*  HCT 23.9* 23.1*  PLT 143* 105*   BMET:  Basename 04/28/12 0635 04/27/12 0430  NA 125* 125*  K 4.1 4.5  CL 88* 89*  CO2 23 26  GLUCOSE 170* 105*  BUN 30* 29*  CREATININE 0.88 0.91  CALCIUM 7.7* 7.5*    PT/INR:  Basename 04/28/12 0635  LABPROT 27.4*  INR 2.71*   ABG    Component Value Date/Time   PHART 7.378 04/23/2012 1111   HCO3 26.5* 04/23/2012 1111   TCO2 27 04/23/2012 1728   ACIDBASEDEF 6.0* 04/22/2012 1218   O2SAT 93.0 04/23/2012 1111   CBG (last 3)   Basename 04/27/12 0733 04/26/12 2233 04/26/12 1613  GLUCAP 102* 166* 107*    Assessment/Plan: S/P Procedure(s) (LRB): THORACIC ASCENDING ANEURYSM REPAIR (AAA) (N/A) CORONARY ARTERY BYPASS GRAFTING (CABG) (N/A) INTRAOPERATIVE  TRANSESOPHAGEAL ECHOCARDIOGRAM (N/A)  1. CV- NSR on Amiodarone and Lopressor 2. Pulm- off oxygen, CXR with right side opacity- on Levaquin for Pneumonia 3. INR 2.71- will decrease coumadin to 2.5 mg daily 4. Dysphagia- S/S following, on regular diet 5. Volume Overload- improved, patient just slightly above baseline weigh on diuresis 6. Dispo- patient doing well, he has a new wound dehiscence of his right thigh incision, INR supra-therapeutic, patient would like to be discharged home today    LOS: 6 days    Jon [name], Jon [name] 04/28/2012   He is not ready to go home. He gets short of breath with ambulation, has hoarse voice, possible aspiration and swallowing dysfunction as noted by speech therapist. 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 He also has hyponatremia, increasing leukocytosis, rapid rise in INR, open right thigh incision. Hold coumadin tonight. He is on amio and Levaquin, both of which make him more sensitive to coumadin. Dressing changes to leg wound. He still reports blurred vision postop and had complete loss of vision when the dissection started.

## 2012-04-29 LAB — CBC
HCT: 26 % — ABNORMAL LOW (ref 39.0–52.0)
MCV: 92.9 fL (ref 78.0–100.0)
Platelets: 163 10*3/uL (ref 150–400)
RBC: 2.8 MIL/uL — ABNORMAL LOW (ref 4.22–5.81)
RDW: 16.7 % — ABNORMAL HIGH (ref 11.5–15.5)
WBC: 20.9 10*3/uL — ABNORMAL HIGH (ref 4.0–10.5)

## 2012-04-29 LAB — BASIC METABOLIC PANEL
CO2: 26 mEq/L (ref 19–32)
Calcium: 7.8 mg/dL — ABNORMAL LOW (ref 8.4–10.5)
Chloride: 87 mEq/L — ABNORMAL LOW (ref 96–112)
GFR calc Af Amer: >90 mL/min (ref >90)
Sodium: 125 mEq/L — ABNORMAL LOW (ref 135–145)

## 2012-04-29 LAB — PROTIME-INR
INR: 2.72 — ABNORMAL HIGH (ref 0.00–1.49)
Prothrombin Time: 27.5 seconds — ABNORMAL HIGH (ref 11.6–15.2)

## 2012-04-29 LAB — GLUCOSE, CAPILLARY: Glucose-Capillary: 93 mg/dL (ref 70–99)

## 2012-04-29 MED ORDER — WARFARIN SODIUM 2.5 MG PO TABS
2.5000 mg | ORAL_TABLET | Freq: Every day | ORAL | Status: DC
  Administered 2012-04-30: 2.5 mg via ORAL
  Filled 2012-04-29 (×3): qty 1

## 2012-04-29 NOTE — Progress Notes (Signed)
Pt continuing to fluctuate between NSR and A. Fib. Pt also having multiple pauses, gradually getting longer, and HR dropping into the high 30's. Pt currently sustaining HR in 50's. Dr. Cornelius Moras paged, no order received. Will continue to monitor pt closely.

## 2012-04-29 NOTE — Progress Notes (Signed)
Notified that pt's rhythm changed to A.Fib. V/S and EKG obtained: BP: 112/68, HR:90's. EKG read A. Fib. Pt currently taking Amiodarone and Lopressor, both given at 2240. Dr. Cornelius Moras called, no new orders given; will not intervene since pt is stable per MD.

## 2012-04-29 NOTE — Progress Notes (Signed)
Speech Language Pathology Dysphagia Treatment Patient Details Name: Jon [name] MRN: [id number] DOB: [birth year] Today's Date: 04/29/2012 Time: 8295-6213 SLP Time Calculation (min): 12 min  Assessment / Plan / Recommendation Clinical Impression  Pt presents with concerns for impaired airway protection as a result of a pharyngeal dysphagia.  Continues to cough frequently on secretions and with thin liquids.  Symptoms are minimized with consumption of nectar-thick liquids.  Recommend proceeding with FEES on 12/26.  Discussed concerns for pharyngeal dysphagia and recommendations with pt.  He is in agreement.   In addition, given dizziness with ambulating, disinhibition, impulsivity, and visual changes, pt may benefit from PT and OT evals.      Diet Recommendation  Initiate / Change Diet: Regular;Nectar-thick liquid    PLAN: FEES- please order for 12/26; consider ordering PT/OT evals.      Swallowing Goals  SLP Swallowing Goals Patient will consume recommended diet without observed clinical signs of aspiration with: Supervision/safety  General Temperature Spikes Noted: No Respiratory Status: Room air Behavior/Cognition: Alert;Impulsive;Decreased sustained attention Oral Cavity - Dentition: Adequate natural dentition Patient Positioning: Upright in bed  Oral Cavity - Oral Hygiene Does patient have any of the following "at risk" factors?: None of the above   Dysphagia Treatment Treatment focused on: Skilled observation of diet tolerance;Facilitation of pharyngeal phase Treatment Methods/Modalities: Skilled observation;Differential diagnosis Patient observed directly with PO's: Yes Type of PO's observed: Thin liquids;Nectar-thick liquids Feeding: Able to feed self Liquids provided via: Cup Pharyngeal Phase Signs & Symptoms: Immediate cough (decreased with nectars) Type of cueing: Verbal Amount of cueing: Minimal   GO    Jon Felten L. Jon [name], [place] CCC/SLP Pager  [phone]  Jon [name] 04/29/2012, 3:42 PM

## 2012-04-29 NOTE — Progress Notes (Addendum)
[address]            [place] [address]          [phone]     7 Days Post-Op  Procedure(s) (LRB): THORACIC ASCENDING ANEURYSM REPAIR (AAA) (N/A) CORONARY ARTERY BYPASS GRAFTING (CABG) (N/A) INTRAOPERATIVE TRANSESOPHAGEAL ECHOCARDIOGRAM (N/A) Subjective: Anxious, some nausea, remains hoarse  Objective  Telemetry sinus/junctional/ afib  Temp:  [97.2 F (36.2 C)-97.9 F (36.6 C)] 97.2 F (36.2 C) (12/24 0455) Pulse Rate:  [49-85] 57  (12/24 0455) Resp:  [18-20] 20  (12/24 0455) BP: (98-128)/(63-86) 98/86 mmHg (12/24 0455) SpO2:  [95 %-97 %] 97 % (12/24 0205) Weight:  [187 lb 6.3 oz (85 kg)] 187 lb 6.3 oz (85 kg) (12/24 0455)   Intake/Output Summary (Last 24 hours) at 04/29/12 0804 Last data filed at 04/28/12 2245  Gross per 24 hour  Intake    600 ml  Output    175 ml  Net    425 ml       General appearance: alert, cooperative and no distress Heart: regular rate and rhythm Lungs: dim in bases Abdomen: soft, nontender Extremities: no edema Wound: some drainage from thigh incision  Lab Results:  Basename 04/29/12 0510 04/28/12 0635  NA 125* 125*  K 4.4 4.1  CL 87* 88*  CO2 26 23  GLUCOSE 94 170*  BUN 31* 30*  CREATININE 1.03 0.88  CALCIUM 7.8* 7.7*  MG -- --  PHOS -- --   No results found for this basename: AST:2,ALT:2,ALKPHOS:2,BILITOT:2,PROT:2,ALBUMIN:2 in the last 72 hours No results found for this basename: LIPASE:2,AMYLASE:2 in the last 72 hours  Basename 04/29/12 0510 04/28/12 0635  WBC 20.9* 19.1*  NEUTROABS -- --  HGB 8.7* 8.1*  HCT 26.0* 23.9*  MCV 92.9 90.5  PLT 163 143*   No results found for this basename: CKTOTAL:4,CKMB:4,TROPONINI:4 in the last 72 hours No components found with this basename: POCBNP:3 No results found for this basename: DDIMER in the last 72 hours No results found for this basename: HGBA1C in the last 72 hours No results found for this basename: CHOL,HDL,LDLCALC,TRIG,CHOLHDL in  the last 72 hours No results found for this basename: TSH,T4TOTAL,FREET3,T3FREE,THYROIDAB in the last 72 hours No results found for this basename: VITAMINB12,FOLATE,FERRITIN,TIBC,IRON,RETICCTPCT in the last 72 hours  Medications: Scheduled    . amiodarone  400 mg Oral BID  . bisacodyl  10 mg Oral Daily   Or  . bisacodyl  10 mg Rectal Daily  . docusate sodium  200 mg Oral Daily  . feeding supplement  1 Container Oral TID BM  . ferrous fumarate-b12-vitamic C-folic acid  1 capsule Oral TID PC  . levofloxacin  500 mg Oral Daily  . metoprolol tartrate  25 mg Oral BID  . pantoprazole  40 mg Oral Daily  . warfarin   Does not apply Once  . Warfarin - Physician Dosing Inpatient   Does not apply q1800     Radiology/Studies:  Dg Chest 2 View  04/28/2012  *RADIOLOGY REPORT*  Clinical Data: Post aortic valve replacement, shortness of breath  CHEST - 2 VIEW  Comparison: Portable chest x-ray of 04/27/2012  Findings: Aeration has improved slightly.  There is still a parenchymal opacity in the right mid lung as well as a small right effusion with a smaller left effusion present as well. Cardiomegaly is stable.  No pneumothorax is seen.  IMPRESSION: Slightly better  aeration.  Persistent opacity in the right mid lung with small effusions right greater than left.   Original Report Authenticated By: Dwyane Dee, M.D.     INR:2.72 Will add last result for INR, ABG once components are confirmed Will add last 4 CBG results once components are confirmed  Assessment/Plan: S/P Procedure(s) (LRB): THORACIC ASCENDING ANEURYSM REPAIR (AAA) (N/A) CORONARY ARTERY BYPASS GRAFTING (CABG) (N/A) INTRAOPERATIVE TRANSESOPHAGEAL ECHOCARDIOGRAM (N/A)  1 monitor rhythm , on amio and beta blocker 2 conts levaquin for right side infiltrate-leukocytosis stable 3 hyponatremia stable- no diuretic currently, check bnp 4 cont coumadin 5 speech therapy working with patient for dysphagia 6 cont wound care right thigh  LOS:  7 days    GOLD,WAYNE E 12/24/20138:04 AM     Chart reviewed, patient examined, agree with above.

## 2012-04-30 LAB — PRO B NATRIURETIC PEPTIDE: Pro B Natriuretic peptide (BNP): 4648 pg/mL — ABNORMAL HIGH (ref 0–125)

## 2012-04-30 LAB — PROTIME-INR: INR: 2.29 — ABNORMAL HIGH (ref 0.00–1.49)

## 2012-04-30 NOTE — Progress Notes (Signed)
Asked pt to walk in  [place] and pt states "I'm not ready to do that today." Pt is worried about visual changes and falling r/t to those changes. Will continue to attempt to walk pt in hall. Pt has walked within room today. Will continue to monitor pt.

## 2012-04-30 NOTE — Progress Notes (Addendum)
8 Days Post-Op Procedure(s) (LRB): THORACIC ASCENDING ANEURYSM REPAIR (AAA) (N/A) CORONARY ARTERY BYPASS GRAFTING (CABG) (N/A) INTRAOPERATIVE TRANSESOPHAGEAL ECHOCARDIOGRAM (N/A) Subjective:  Jon [name] has several complaints this morning.  He continues to complain of blurry vision, stating its just not getting better.  He complains of cough and difficulty getting sputum up.  He is not ambulating much stating his vision makes him unsteady on his feet and he is afraid he will fall.  Objective: Vital signs in last 24 hours: Temp:  [97.5 F (36.4 C)-97.9 F (36.6 C)] 97.7 F (36.5 C) (12/25 0415) Pulse Rate:  [55-74] 55  (12/25 0415) Cardiac Rhythm:  [-] Sinus bradycardia;Heart block (12/24 1940) Resp:  [19-20] 20  (12/25 0415) BP: (117-125)/(63-78) 117/63 mmHg (12/25 0415) SpO2:  [94 %-96 %] 96 % (12/25 0415) Weight:  [184 lb 11.2 oz (83.779 kg)] 184 lb 11.2 oz (83.779 kg) (12/25 0322)  Intake/Output from previous day: 12/24 0701 - 12/25 0700 In: 240 [P.O.:240] Out: 550 [Urine:550]  General appearance: alert, cooperative and no distress Heart: regular rate and rhythm Lungs: clear to auscultation bilaterally Abdomen: soft, non-tender; bowel sounds normal; no masses,  no organomegaly Extremities: edema trace Wound: RLE thigh wound dehisced + serous drainge present, sternotomy C/D/I  Lab Results:  Basename 04/29/12 0510 04/28/12 0635  WBC 20.9* 19.1*  HGB 8.7* 8.1*  HCT 26.0* 23.9*  PLT 163 143*   BMET:  Basename 04/29/12 0510 04/28/12 0635  NA 125* 125*  K 4.4 4.1  CL 87* 88*  CO2 26 23  GLUCOSE 94 170*  BUN 31* 30*  CREATININE 1.03 0.88  CALCIUM 7.8* 7.7*    PT/INR:  Basename 04/30/12 0515  LABPROT 24.2*  INR 2.29*   ABG    Component Value Date/Time   PHART 7.378 04/23/2012 1111   HCO3 26.5* 04/23/2012 1111   TCO2 27 04/23/2012 1728   ACIDBASEDEF 6.0* 04/22/2012 1218   O2SAT 93.0 04/23/2012 1111   CBG (last 3)   Basename 04/29/12 2202 04/27/12 0733   GLUCAP 93 102*    Assessment/Plan: S/P Procedure(s) (LRB): THORACIC ASCENDING ANEURYSM REPAIR (AAA) (N/A) CORONARY ARTERY BYPASS GRAFTING (CABG) (N/A) INTRAOPERATIVE TRANSESOPHAGEAL ECHOCARDIOGRAM (N/A)  1. CV- NSR rate bradycardic at times, pressure controlled on Amiodarone and Beta blocker 2. Pulm- suspected R sided infiltrate on Levaquin, continue IS 3. Dysphagia- likely due to vocal cord injury- S/S evaluating,  Plan for FEES tomorrow 4. INR 2.27- continue 2.5 mg coumadin nightly 5. Leukocytosis- on Levaquin for suspected pneumonia, afebrile, RLE wound looks clean ? Source? 6. Hyponatremia- BNP >4000- no on Lasix, weight at baseline 7. PT consult- needs to ambulate 8. Dispo- S/S eval tomorrow, INR therapeutic, Leukocytosis afebrile will monitor   LOS: 8 days    Jon [name], Jon [name] 04/30/2012   patient examined and medical record reviewed,agree with above note.  Visual symptoms are not improved and are slowing his rehabilitation. Head CT scan unremarkable. Will arrange for ophthalmology consultation tomorrow Mikey Bussing 04/30/2012

## 2012-04-30 NOTE — Progress Notes (Signed)
Pt amb with walker and 2xA. Pt voiced concerns of anxiety r/t vision changes. Pt walked 200 ft with no other complaints. Pt placed in bed with call bell in reach. Will continue to monitor pt closely.

## 2012-05-01 ENCOUNTER — Inpatient Hospital Stay (HOSPITAL_COMMUNITY): Payer: Medicaid Other

## 2012-05-01 MED ORDER — WARFARIN SODIUM 2.5 MG PO TABS
2.5000 mg | ORAL_TABLET | Freq: Every day | ORAL | Status: DC
  Administered 2012-05-01: 2.5 mg via ORAL
  Filled 2012-05-01 (×2): qty 1

## 2012-05-01 MED ORDER — FUROSEMIDE 40 MG PO TABS
40.0000 mg | ORAL_TABLET | Freq: Every day | ORAL | Status: DC
  Filled 2012-05-01: qty 1

## 2012-05-01 MED ORDER — POTASSIUM CHLORIDE CRYS ER 20 MEQ PO TBCR: 20.0000 meq | EXTENDED_RELEASE_TABLET | Freq: Every day | ORAL | Status: DC

## 2012-05-01 NOTE — Progress Notes (Addendum)
[address]            [place] [address]          [phone]     9 Days Post-Op  Procedure(s) (LRB): THORACIC ASCENDING ANEURYSM REPAIR (AAA) (N/A) CORONARY ARTERY BYPASS GRAFTING (CABG) (N/A) INTRAOPERATIVE TRANSESOPHAGEAL ECHOCARDIOGRAM (N/A) Subjective: Feels ok, but some visual problems persist  Objective  Telemetry sinus brady/rhythm  Temp:  [97.3 F (36.3 C)-97.8 F (36.6 C)] 97.4 F (36.3 C) (12/26 0442) Pulse Rate:  [56-86] 59  (12/26 0442) Resp:  [18] 18  (12/26 0442) BP: (100-118)/(68-81) 100/68 mmHg (12/26 0442) SpO2:  [95 %-97 %] 95 % (12/26 0442) Weight:  [182 lb 6.4 oz (82.736 kg)] 182 lb 6.4 oz (82.736 kg) (12/26 0442)  No intake or output data in the 24 hours ending 05/01/12 0747     General appearance: alert, cooperative and no distress Heart: regular rate and rhythm Lungs: sl coarse throughout Abdomen: soft, nontender Extremities: no edema Wound: right groin with serous drainage, wound packed  Lab Results:  Basename 04/29/12 0510  NA 125*  K 4.4  CL 87*  CO2 26  GLUCOSE 94  BUN 31*  CREATININE 1.03  CALCIUM 7.8*  MG --  PHOS --   No results found for this basename: AST:2,ALT:2,ALKPHOS:2,BILITOT:2,PROT:2,ALBUMIN:2 in the last 72 hours No results found for this basename: LIPASE:2,AMYLASE:2 in the last 72 hours  Basename 04/29/12 0510  WBC 20.9*  NEUTROABS --  HGB 8.7*  HCT 26.0*  MCV 92.9  PLT 163   No results found for this basename: CKTOTAL:4,CKMB:4,TROPONINI:4 in the last 72 hours No components found with this basename: POCBNP:3 No results found for this basename: DDIMER in the last 72 hours No results found for this basename: HGBA1C in the last 72 hours No results found for this basename: CHOL,HDL,LDLCALC,TRIG,CHOLHDL in the last 72 hours No results found for this basename: TSH,T4TOTAL,FREET3,T3FREE,THYROIDAB in the last 72 hours No results found for this basename:  VITAMINB12,FOLATE,FERRITIN,TIBC,IRON,RETICCTPCT in the last 72 hours  Medications: Scheduled    . amiodarone  400 mg Oral BID  . bisacodyl  10 mg Oral Daily   Or  . bisacodyl  10 mg Rectal Daily  . docusate sodium  200 mg Oral Daily  . feeding supplement  1 Container Oral TID BM  . ferrous fumarate-b12-vitamic C-folic acid  1 capsule Oral TID PC  . levofloxacin  500 mg Oral Daily  . metoprolol tartrate  25 mg Oral BID  . pantoprazole  40 mg Oral Daily  . warfarin  2.5 mg Oral Daily  . warfarin   Does not apply Once  . Warfarin - Physician Dosing Inpatient   Does not apply q1800     Radiology/Studies:  No results found.  INR: Will add last result for INR, ABG once components are confirmed Will add last 4 CBG results once components are confirmed  Assessment/Plan: S/P Procedure(s) (LRB): THORACIC ASCENDING ANEURYSM REPAIR (AAA) (N/A) CORONARY ARTERY BYPASS GRAFTING (CABG) (N/A) INTRAOPERATIVE TRANSESOPHAGEAL ECHOCARDIOGRAM (N/A)  1. Rhythm stable, BP controlled 2 conts current abx for infiltrate 3 FEES today 4 recheck WBC, sodium with am labs 5 add diuretic, recheck cxr in am to eval effusions 6 poss opthalmology consult   LOS: 9 days    GOLD,WAYNE E 12/26/20137:47 AM     Chart reviewed, patient examined, agree with above. He is not taking that much po and wt  is at preop, he doesn't have any edema, last cxr showed trivial effusions, BP is on the marginal side. I would not diurese him. He still has blurred vision but had complete visual loss at the time of his dissection. He will need opthalmology followup but that can be done as an outpt.

## 2012-05-01 NOTE — Procedures (Signed)
Objective Swallowing Evaluation: Modified Barium Swallowing Study  Patient Details  Name: Jon [name] MRN: [id number] Date of Birth: Jun 04, 1965  Today's Date: 05/01/2012 Time: 1210-1240 SLP Time Calculation (min): 30 min  Past Medical History:  Past Medical History  Diagnosis Date  . Anxiety    Past Surgical History:  Past Surgical History  Procedure Date  . Cardiac catheterization   . Rotator cuff repair     12 years ago, Left  . Thoracic aortic aneurysm repair 04/22/2012    Procedure: THORACIC ASCENDING ANEURYSM REPAIR (AAA);  Surgeon: [name], MD;  Location: [facility] OR;  Service: Open Heart Surgery;  Laterality: N/A;  . Coronary artery bypass graft 04/22/2012    Procedure: CORONARY ARTERY BYPASS GRAFTING (CABG);  Surgeon: [name], MD;  Location: [facility] OR;  Service: Open Heart Surgery;  Laterality: N/A;  times one with right greater saphenous vein graft  . Intraoperative transesophageal echocardiogram 04/22/2012    Procedure: INTRAOPERATIVE TRANSESOPHAGEAL ECHOCARDIOGRAM;  Surgeon: [name], MD;  Location: [facility] OR;  Service: Open Heart Surgery;  Laterality: N/A;   HPI:  46 y/o male with history of aortic stenosis since childhood who developed sudden onset of severe CP.  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  Patient transferred to [facility] for Thoracic ascending aneurysm repair, CABG and Intraopertive Transesophageal Echocardiogram.  CXR indicates Right lung infliltrate . Pt has been followed by SLP to address dysphagia s/p surgery.  Pt has not been tolerating thin liquids, with consistent s/s aspiration.  Plan was for FEES today, but due to instrument malfunction, MBS was ordered as an alternative.        Assessment / Plan / Recommendation Clinical Impression  Dysphagia Diagnosis: Mild pharyngeal phase dysphagia Clinical impression: Pt presents with a pharyngeal dysphagia, isolated to inadequate laryngeal closure  during the pharyngeal phase.  This impairment leads to consistent aspiration of thin liquids, accompanied by a nonprotective cough response.  Postural adjustments (head turn to left and right) increased aspiration; a chin tuck resulted in liquids penetrating the larynx but not descending below the vocal cords.  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  After lengthy discussion with pt and spouse, rec: nectar-thick liquids OR thin liquids with use of a chin-tuck; meds whole with puree; SLP f/u for swallowing therapy after D/C from hospital. Spouse is in agreement; pt with impaired insight into circumstances.  SLP will follow here until D/C.    Treatment Recommendation  Therapy as outlined in treatment plan below    Diet Recommendation Regular;Nectar-thick liquid (and thin with chin tuck)   Liquid Administration via: Cup Medication Administration: Whole meds with puree Supervision: Patient able to self feed;Intermittent supervision to cue for compensatory strategies Compensations: Slow rate;Small sips/bites Postural Changes and/or Swallow Maneuvers: Chin tuck    Other  Recommendations Oral Care Recommendations: Oral care BID   Follow Up Recommendations       Frequency and Duration min 2x/week  2 weeks   Pertinent Vitals/Pain none    SLP Swallow Goals Patient will consume recommended diet without observed clinical signs of aspiration with: Supervision/safety Swallow Study Goal #1 - Progress: Progressing toward goal Patient will utilize recommended strategies during swallow to increase swallowing safety with: Supervision/safety Swallow Study Goal #2 - Progress: Progressing toward goal   General Date of Onset: 04/22/12 HPI: 46 y/o male with history of aortic stenosis since childhood who developed sudden onset of severe CP.  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  Patient transferred to [facility] for Thoracic ascending aneurysm  repair, CABG and Intraopertive Transesophageal Echocardiogram.  CXR indicates Right lung infliltrate . Pt has been followed by SLP to address dysphagia s/p surgery.  Pt has not been tolerating thin liquids, with consistent s/s aspiration.  Plan was for FEES today, but due to insturment malfunction, MBS was ordered as an alternative.    Type of Study: Modified Barium Swallowing Study Reason for Referral: Objectively evaluate swallowing function Diet Prior to this Study: Regular;Nectar-thick liquids Temperature Spikes Noted: No Respiratory Status: Room air History of Recent Intubation: Yes Behavior/Cognition: Alert;Impulsive;Decreased sustained attention Oral Cavity - Dentition: Adequate natural dentition Oral Motor / Sensory Function: Within functional limits Self-Feeding Abilities: Able to feed self Patient Positioning: Upright in chair Baseline Vocal Quality: Hoarse;Low vocal intensity Volitional Cough: Weak Volitional Swallow: Able to elicit Anatomy: Within functional limits Pharyngeal Secretions: Not observed secondary MBS    Reason for Referral Objectively evaluate swallowing function   Oral Phase Oral Preparation/Oral Phase Oral Phase: WFL   Pharyngeal Phase Pharyngeal Phase Pharyngeal Phase: Impaired Pharyngeal - Thin Pharyngeal - Thin Cup: Reduced airway/laryngeal closure;Penetration/Aspiration during swallow;Moderate aspiration;Compensatory strategies attempted (Comment) (head turn left and right; mendelsohn maneuver, chin tuck) Penetration/Aspiration details (thin cup): Material enters airway, passes BELOW cords and not ejected out despite cough attempt by patient  Cervical Esophageal Phase    GO   [name] L. [place], [place] CCC/SLP Pager [phone]  Cervical Esophageal Phase Cervical Esophageal Phase: Leonarda Salon         Blenda Mounts Laurice 05/01/2012, 2:22 PM

## 2012-05-01 NOTE — Progress Notes (Signed)
Pt amb with walker and 1xA in hallway 300 ft. Pt did not have any complaints. Pt had moments of unsteadiness. Will continue to monitor pt.

## 2012-05-01 NOTE — Consult Note (Signed)
Reason for Consult:Blurred Vision both eyes, distance and near that started before he came to the hospital but is worse now.  Does not improve with brighter light. Referring Physician: Dr Venetia Constable is an 46 y.o. male.  HPI: Blurred vision both eyes, distance and near that is worse since his surgery for AAA  Past Medical History  Diagnosis Date  . Anxiety     Past Surgical History  Procedure Date  . Cardiac catheterization   . Rotator cuff repair     12 years ago, Left  . Thoracic aortic aneurysm repair 04/22/2012    Procedure: THORACIC ASCENDING ANEURYSM REPAIR (AAA);  Surgeon: [name], MD;  Location: [facility] OR;  Service: Open Heart Surgery;  Laterality: N/A;  . Coronary artery bypass graft 04/22/2012    Procedure: CORONARY ARTERY BYPASS GRAFTING (CABG);  Surgeon: [name], MD;  Location: [facility] OR;  Service: Open Heart Surgery;  Laterality: N/A;  times one with right greater saphenous vein graft  . Intraoperative transesophageal echocardiogram 04/22/2012    Procedure: INTRAOPERATIVE TRANSESOPHAGEAL ECHOCARDIOGRAM;  Surgeon: [name], MD;  Location: [facility] OR;  Service: Open Heart Surgery;  Laterality: N/A;    Family History  Problem Relation Age of Onset  . Lung disease Mother   . Diabetes Father   . Peripheral Artery Disease Father   . Hypertension Father     Social History:  reports that he has been smoking Cigarettes.  He has been smoking about 2.5 packs per day. He does not have any smokeless tobacco history on file. He reports that he drinks alcohol. He reports that he does not use illicit drugs.  Allergies:  Allergies  Allergen Reactions  . Dilaudid (Hydromorphone Hcl) Other (See Comments)    High doses cause itching  . Penicillins Other (See Comments)    Unknown    Medications:  Continuous:  Anti-infectives     Start     Dose/Rate Route Frequency Ordered Stop   04/27/12 1200   levofloxacin (LEVAQUIN) tablet  500 mg        500 mg Oral Daily 04/27/12 1127     04/23/12 1000   levofloxacin (LEVAQUIN) IVPB 750 mg  Status:  Discontinued        750 mg 100 mL/hr over 90 Minutes Intravenous Every 24 hours 04/22/12 1447 04/23/12 0855   04/22/12 2000   vancomycin (VANCOCIN) IVPB 1000 mg/200 mL premix        1,000 mg 200 mL/hr over 60 Minutes Intravenous  Once 04/22/12 1447 04/22/12 2110   04/22/12 0200   vancomycin (VANCOCIN) 1,250 mg in sodium chloride 0.9 % 250 mL IVPB  Status:  Discontinued        1,250 mg 166.7 mL/hr over 90 Minutes Intravenous To Surgery 04/22/12 0159 04/22/12 1416   04/22/12 0200   levofloxacin (LEVAQUIN) IVPB 500 mg        500 mg 100 mL/hr over 60 Minutes Intravenous To Surgery 04/22/12 0159 04/22/12 0940          Results for orders placed during the hospital encounter of 04/22/12 (from the past 48 hour(s))  PROTIME-INR     Status: Abnormal   Collection Time   04/30/12  5:15 AM      Component Value Range Comment   Prothrombin Time 24.2 (*) 11.6 - 15.2 seconds    INR 2.29 (*) 0.00 - 1.49   PRO B NATRIURETIC PEPTIDE     Status: Abnormal   Collection Time  04/30/12  5:15 AM      Component Value Range Comment   Pro B Natriuretic peptide (BNP) 4648.0 (*) 0 - 125 pg/mL   PROTIME-INR     Status: Abnormal   Collection Time   05/01/12  5:26 AM      Component Value Range Comment   Prothrombin Time 24.4 (*) 11.6 - 15.2 seconds    INR 2.32 (*) 0.00 - 1.49     Dg Swallowing Func-speech Pathology  05/01/2012  Carolan Shiver, CCC-SLP     05/01/2012  2:24 PM Objective Swallowing Evaluation: Modified Barium Swallowing Study   Patient Details  Name: Jon [name] MRN: [id number] Date of Birth: [birth year]  Today's Date: 05/01/2012 Time: 1210-1240 SLP Time Calculation (min): 30 min  Past Medical History:  Past Medical History  Diagnosis Date  . Anxiety    Past Surgical History:  Past Surgical History  Procedure Date  . Cardiac catheterization   . Rotator cuff repair      12 years ago, Left  . Thoracic aortic aneurysm repair 04/22/2012    Procedure: THORACIC ASCENDING ANEURYSM REPAIR (AAA);  Surgeon:  [name], MD;  Location: [facility] OR;  Service: Open Heart  Surgery;  Laterality: N/A;  . Coronary artery bypass graft 04/22/2012    Procedure: CORONARY ARTERY BYPASS GRAFTING (CABG);  Surgeon:  [name], MD;  Location: [facility] OR;  Service: Open Heart  Surgery;  Laterality: N/A;  times one with right greater  saphenous vein graft  . Intraoperative transesophageal echocardiogram 04/22/2012    Procedure: INTRAOPERATIVE TRANSESOPHAGEAL ECHOCARDIOGRAM;   Surgeon: [name], MD;  Location: [facility] OR;  Service:  Open Heart Surgery;  Laterality: N/A;   HPI:  46 y/o male with history of aortic stenosis since childhood who  developed sudden onset of severe CP.  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  Patient  transferred to [facility] for Thoracic ascending aneurysm repair, CABG  and Intraopertive Transesophageal Echocardiogram.  CXR indicates  Right lung infliltrate . Pt has been followed by SLP to address  dysphagia s/p surgery.  Pt has not been tolerating thin liquids,  with consistent s/s aspiration.  Plan was for FEES today, but due  to instrument malfunction, MBS was ordered as an alternative.        Assessment / Plan / Recommendation Clinical Impression  Dysphagia Diagnosis: Mild pharyngeal phase dysphagia Clinical impression: Pt presents with a pharyngeal dysphagia,  isolated to inadequate laryngeal closure during the pharyngeal  phase.  This impairment leads to consistent aspiration of thin  liquids, accompanied by a nonprotective cough response.  Postural  adjustments (head turn to left and right) increased aspiration; a  chin tuck resulted in liquids penetrating the larynx but not  descending below the vocal cords.  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  After lengthy  discussion with pt and spouse,  rec: nectar-thick liquids OR thin  liquids with use of a chin-tuck; meds whole with puree; SLP f/u  for swallowing therapy after D/C from hospital. Spouse is in  agreement; pt with impaired insight into circumstances.  SLP will  follow here until D/C.    Treatment Recommendation  Therapy as outlined in treatment plan below    Diet Recommendation Regular;Nectar-thick liquid (and thin with  chin tuck)   Liquid Administration via: Cup Medication Administration: Whole meds with puree Supervision: Patient able to self feed;Intermittent supervision  to cue for compensatory  strategies Compensations: Slow rate;Small sips/bites Postural Changes and/or Swallow Maneuvers: Chin tuck    Other  Recommendations Oral Care Recommendations: Oral care BID   Follow Up Recommendations       Frequency and Duration min 2x/week  2 weeks   Pertinent Vitals/Pain none    SLP Swallow Goals Patient will consume recommended diet without observed clinical  signs of aspiration with: Supervision/safety Swallow Study Goal #1 - Progress: Progressing toward goal Patient will utilize recommended strategies during swallow to  increase swallowing safety with: Supervision/safety Swallow Study Goal #2 - Progress: Progressing toward goal   General Date of Onset: 04/22/12 HPI: 46 y/o male with history of aortic stenosis since childhood  who developed sudden onset of severe CP.  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   Patient transferred to [facility] for Thoracic ascending aneurysm repair,  CABG and Intraopertive Transesophageal Echocardiogram.  CXR  indicates Right lung infliltrate . Pt has been followed by SLP to  address dysphagia s/p surgery.  Pt has not been tolerating thin  liquids, with consistent s/s aspiration.  Plan was for FEES  today, but due to insturment malfunction, MBS was ordered as an  alternative.    Type of Study: Modified Barium Swallowing Study Reason for Referral: Objectively evaluate swallowing function  Diet Prior to this Study: Regular;Nectar-thick liquids Temperature Spikes Noted: No Respiratory Status: Room air History of Recent Intubation: Yes Behavior/Cognition: Alert;Impulsive;Decreased sustained attention Oral Cavity - Dentition: Adequate natural dentition Oral Motor / Sensory Function: Within functional limits Self-Feeding Abilities: Able to feed self Patient Positioning: Upright in chair Baseline Vocal Quality: Hoarse;Low vocal intensity Volitional Cough: Weak Volitional Swallow: Able to elicit Anatomy: Within functional limits Pharyngeal Secretions: Not observed secondary MBS    Reason for Referral Objectively evaluate swallowing function   Oral Phase Oral Preparation/Oral Phase Oral Phase: WFL   Pharyngeal Phase Pharyngeal Phase Pharyngeal Phase: Impaired Pharyngeal - Thin Pharyngeal - Thin Cup: Reduced airway/laryngeal  closure;Penetration/Aspiration during swallow;Moderate  aspiration;Compensatory strategies attempted (Comment) (head turn  left and right; mendelsohn maneuver, chin tuck) Penetration/Aspiration details (thin cup): Material enters  airway, passes BELOW cords and not ejected out despite cough  attempt by patient  Cervical Esophageal Phase    GO   Amanda L. [place], [place] CCC/SLP Pager [phone]  Cervical Esophageal Phase Cervical Esophageal Phase: Leonarda Salon         Blenda Mounts Laurice 05/01/2012, 2:22 PM      Review of Systems  Unable to perform ROS Eyes: Positive for blurred vision.  All other systems reviewed and are negative.   Blood pressure 120/72, pulse 74, temperature 97.4 F (36.3 C), temperature source Oral, resp. rate 20, height 5\' 8"  (1.727 m), weight 82.736 kg (182 lb 6.4 oz), SpO2 96.00%. Physical Exam Patient complains of blurred vision prior to coming to have surgery for aortic aneurysm.  The blurred vision is worse since his surgery. AV:WUJW Card without correction  OD:20/200 OS: 20/400 Pupils:5/5 Regular Round Reactive to Light  No APD EOMs: Patient had full range  of motion both eyes, he could not look up or down on command but was able to look up and down following his hand Confrontation VFs: Patient struggles with the proper way to perform confrontation VFs, but seems to see in all quadrants when I present just my hands. When asked directly about where his blurry vision is located he says he can't see in the far periphery in left gaze. Tono pen IOPs:  OD:11  OS: 15 Penlight Exam: Lids/Lashes: Normal OU Conj: Normal OU Cornea: Clear OU AC: Deep OU Iris: Normal OU Lens: Clear OU Dilated Fundus Exam: Dilate with Tropicamide OU X2 C/D Ratio:  0.3 OU Optic Nerves appear normal without pallor Macula: normal OU Vessels: Moderate attenuation(?Hypertensive Retinopathy) Periphery: Normal OU Vitreous: Normal OU    Assessment/Plan: Assessment:  Patients eyes themselves appear normal except for some hypertensive retinopathy. He likely needs to have a refraction and possibly a glasses prescription for distance and near. But the way that the patient is struggling to focus likely means that he has had some form of CVA leading to his inability to see--possibly in the visual pathways or the occipital lobes. Plan:Refraction needs to be performed in the office  Formal VFs will likely need to be performed in the office  MRI of brain  Taiwan Millon V 05/01/2012, 11:54 PM

## 2012-05-01 NOTE — Evaluation (Deleted)
Physical Therapy Evaluation Patient Details Name: Jon [name] MRN: [id number] DOB: 14-Dec-1965 Today's Date: 05/01/2012 Time: 4098-1191 PT Time Calculation (min): 27 min  PT Assessment / Plan / Recommendation Clinical Impression  Pt is a 46 y/o male s/p Thoraci ascending anneurysm repair.  Pt presents with impaired vision, proprioception and balance. Pt is impulsive and demonstrates poor safety awareness.  Cause of visual  and balance impairment is unclear.   At this time pt is a significant falls risk. Acute PT to follow pt.  Suggesting vestbular evaluation and Optomology consult.      PT Assessment  Patient needs continued PT services    Follow Up Recommendations  Home health PT;Supervision/Assistance - 24 hour    Does the patient have the potential to tolerate intense rehabilitation      Barriers to Discharge None      Equipment Recommendations  Other (comment) (to be determined based on progress. )    Recommendations for Other Services     Frequency Min 5X/week    Precautions / Restrictions Precautions Precautions: Fall Precaution Comments: Educated pt and spouse extensively in falls precaution, components of balance testing, vestibular and propriception function in balance.  Restrictions Weight Bearing Restrictions: No   Pertinent Vitals/Pain No c/o pain.        Mobility  Bed Mobility Bed Mobility: Supine to Sit;Sit to Supine Supine to Sit: 5: Supervision;HOB flat Sit to Supine: 5: Supervision;HOB flat Details for Bed Mobility Assistance: c/o increased dizziness in sitting vs supine.   Transfers Transfers: Sit to Stand;Stand to Sit Sit to Stand: 4: Min guard;From bed;From chair/3-in-1 Stand to Sit: 4: Min guard;To chair/3-in-1;With upper extremity assist Details for Transfer Assistance: Min guard assist for safety secondary to pt c/o dizziness and double vision  Ambulation/Gait Ambulation/Gait Assistance: 4: Min assist Ambulation Distance (Feet): 200  Feet Assistive device: None Ambulation/Gait Assistance Details: Gait is unsteady, pt presents with lateral swaying when ambulating. Gait speed increases as pt fatigues. Several (>3) LOB episodes that required PT assistance to recover.   Gait Pattern: Wide base of support;Decreased stride length Gait velocity: inconsistent.  Slow initialy, velocity increased with fatigue and loss of balance.  Stairs: No Wheelchair Mobility Wheelchair Mobility: No    Shoulder Instructions     Exercises     PT Diagnosis: Difficulty walking  PT Problem List: Decreased balance;Decreased mobility;Decreased activity tolerance;Decreased safety awareness;Impaired sensation PT Treatment Interventions: Gait training;DME instruction;Stair training;Functional mobility training;Therapeutic activities;Balance training;Neuromuscular re-education;Patient/family education   PT Goals Acute Rehab PT Goals PT Goal Formulation: With patient Time For Goal Achievement: 05/08/12 Potential to Achieve Goals: Good Pt will go Sit to Stand: with modified independence PT Goal: Sit to Stand - Progress: Goal set today Pt will go Stand to Sit: with modified independence PT Goal: Stand to Sit - Progress: Goal set today Pt will Ambulate: >150 feet;with modified independence;with least restrictive assistive device PT Goal: Ambulate - Progress: Goal set today Pt will Go Up / Down Stairs: 3-5 stairs;with modified independence;with least restrictive assistive device PT Goal: Up/Down Stairs - Progress: Goal set today Additional Goals Additional Goal #1: Pt will perform DGI within safe limits.    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 Communication: No difficulties Dominant Hand: Right    Cognition  Overall Cognitive Status: Appears within functional limits for tasks assessed/performed Arousal/Alertness: Awake/alert Orientation Level: Appears intact for tasks assessed Behavior During Session: [facility] for tasks performed    Extremity/Trunk Assessment Right Upper Extremity Assessment RUE ROM/Strength/Tone: Within functional levels Left Upper Extremity Assessment LUE ROM/Strength/Tone: Within functional levels Right Lower Extremity Assessment RLE ROM/Strength/Tone: WFL for tasks assessed RLE Sensation: Deficits RLE Sensation Deficits: impaired proprioception in feet/ankles.   RLE Coordination: WFL - gross/fine motor Left Lower Extremity Assessment LLE ROM/Strength/Tone: WFL for tasks assessed LLE Sensation: Deficits LLE Sensation Deficits: impaired proprioception in feet and ankles Trunk Assessment Trunk Assessment: Normal   Balance Balance Balance Assessed: Yes Static Standing Balance Static Standing - Balance Support: No upper extremity supported Static Standing - Level of Assistance: 5: Stand by assistance Static Standing - Comment/# of Minutes: 30 seconds static standing with increased A-P sway but no LOB.   Tandem Stance - Right Leg: 10  (less than 10 seconds before LOB x 2 attempts) Tandem Stance - Left Leg: 10  (<10 seconds before LOB x 2 attempts.  ) Dynamic Standing Balance Dynamic Standing - Balance Activities: Forward lean/weight shifting;Lateral lean/weight shifting;Reaching for  weighted objects;Reaching across midline Dynamic Standing - Comments: LOB with each activity.  Required stepping strategy to recover.  Pt present with poor depth perception reaching past objects or significantly short of the object.   Standardized Balance Assessment Standardized Balance Assessment: Dynamic Gait Index Dynamic Gait Index Level Surface: Moderate Impairment Change in Gait Speed: Moderate Impairment Gait with Horizontal Head Turns: Severe Impairment Gait with Vertical Head Turns: Severe Impairment Gait and Pivot Turn: Moderate Impairment Step Over Obstacle: Moderate Impairment Step Around Obstacles: Moderate Impairment Steps: Mild Impairment Total Score: 7  High Level Balance High Level Balance Activites: Head turns;Sudden stops High Level Balance Comments: LOB required asssistance to correct.    End of Session PT - End of Session Equipment Utilized During Treatment: Gait belt Activity Tolerance: Patient tolerated treatment well Patient left: in chair;with call bell/phone within reach;with family/visitor present Nurse Communication: Mobility status  GP     Gracieann Stannard 05/01/2012, 4:11 PM  Lilyanna Lunt L. Rhandi Despain DPT [phone]

## 2012-05-01 NOTE — Evaluation (Signed)
Physical Therapy Evaluation Patient Details Name: Jon [name] MRN: [id number] DOB: [birth year] Today's Date: 05/01/2012 Time: 0865-7846 PT Time Calculation (min): 27 min  PT Assessment / Plan / Recommendation Clinical Impression  Pt is a 46 y/o male s/p Thoraci ascending anneurysm repair.  Pt presents with impaired vision, proprioception and balance. Pt is impulsive and demonstrates poor safety awareness.  Cause of visual  and balance impairment is unclear.   At this time pt is a significant falls risk. Acute PT to follow pt.  Suggesting vestbular evaluation and Optamology consult.      PT Assessment  Patient needs continued PT services    Follow Up Recommendations  Home health PT;Supervision/Assistance - 24 hour    Barriers to Discharge None      Equipment Recommendations  Other (comment) (to be determined based on progress. )    Recommendations for Other Services     Frequency Min 5X/week    Precautions / Restrictions Precautions Precautions: Fall Precaution Comments: Educated pt and spouse extensively in falls precaution, components of balance testing, vestibular and propriception function in balance.  Restrictions Weight Bearing Restrictions: No   Pertinent Vitals/Pain No c/o pain.       Mobility  Bed Mobility Bed Mobility: Supine to Sit;Sit to Supine Supine to Sit: 5: Supervision;HOB flat Sit to Supine: 5: Supervision;HOB flat Details for Bed Mobility Assistance: c/o increased dizziness in sitting vs supine.   Transfers Transfers: Sit to Stand;Stand to Sit Sit to Stand: 4: Min guard;From bed;From chair/3-in-1 Stand to Sit: 4: Min guard;To chair/3-in-1;With upper extremity assist Details for Transfer Assistance: Min guard assist for safety secondary to pt c/o dizziness and double vision  Ambulation/Gait Ambulation/Gait Assistance: 4: Min assist Ambulation Distance (Feet): 200 Feet Assistive device: None Ambulation/Gait Assistance Details: Gait is  unsteady, pt presents with lateral swaying when ambulating. Gait speed increases as pt fatigues. Several (>3) LOB episodes that required PT assistance to recover.   Gait Pattern: Wide base of support;Decreased stride length Gait velocity: inconsistent.  Slow initialy, velocity increased with fatigue and loss of balance.  Stairs: No Wheelchair Mobility Wheelchair Mobility: No     PT Diagnosis: Difficulty walking  PT Problem List: Decreased balance;Decreased mobility;Decreased activity tolerance;Decreased safety awareness;Impaired sensation PT Treatment Interventions: Gait training;DME instruction;Stair training;Functional mobility training;Therapeutic activities;Balance training;Neuromuscular re-education;Patient/family education   PT Goals Acute Rehab PT Goals PT Goal Formulation: With patient Time For Goal Achievement: 05/08/12 Potential to Achieve Goals: Good Pt will go Sit to Stand: with modified independence PT Goal: Sit to Stand - Progress: Goal set today Pt will go Stand to Sit: with modified independence PT Goal: Stand to Sit - Progress: Goal set today Pt will Ambulate: >150 feet;with modified independence;with least restrictive assistive device PT Goal: Ambulate - Progress: Goal set today Pt will Go Up / Down Stairs: 3-5 stairs;with modified independence;with least restrictive assistive device PT Goal: Up/Down Stairs - Progress: Goal set today Additional Goals Additional Goal #1: Pt will perform DGI within safe limits.    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 Communication: No difficulties Dominant Hand: Right    Cognition  Overall Cognitive Status: Appears within functional limits for tasks assessed/performed Arousal/Alertness: Awake/alert Orientation Level: Appears intact for tasks assessed Behavior During Session: [facility] for tasks performed    Extremity/Trunk Assessment Right Upper Extremity Assessment RUE ROM/Strength/Tone: Within functional levels Left Upper Extremity Assessment LUE ROM/Strength/Tone: Within functional levels Right Lower Extremity Assessment RLE ROM/Strength/Tone: WFL for tasks assessed RLE Sensation: Deficits RLE Sensation Deficits: impaired proprioception in feet/ankles.   RLE Coordination: WFL - gross/fine motor Left Lower Extremity Assessment LLE ROM/Strength/Tone: WFL for tasks assessed LLE Sensation: Deficits LLE Sensation Deficits: impaired proprioception in feet and ankles Trunk Assessment Trunk Assessment: Normal   Balance Balance Balance Assessed: Yes Static Standing Balance Static Standing - Balance Support: No upper extremity supported Static Standing - Level of Assistance: 5: Stand by assistance Static Standing - Comment/# of Minutes: 30 seconds static standing with increased A-P sway but no LOB.   Tandem Stance - Right Leg: 10  (less than 10 seconds before LOB x 2 attempts) Tandem Stance - Left Leg: 10  (<10 seconds before LOB x 2 attempts.  ) Dynamic Standing Balance Dynamic Standing - Balance Activities: Forward lean/weight shifting;Lateral lean/weight shifting;Reaching for weighted objects;Reaching across midline Dynamic Standing - Comments: LOB with each activity.  Required  stepping strategy to recover.  Pt present with poor depth perception reaching past objects or significantly short of the object.   Standardized Balance Assessment Standardized Balance Assessment: Dynamic Gait Index Dynamic Gait Index Level Surface: Moderate Impairment Change in Gait Speed: Moderate Impairment Gait with Horizontal Head Turns: Severe Impairment Gait with Vertical Head Turns: Severe Impairment Gait and Pivot Turn: Moderate Impairment Step Over Obstacle: Moderate Impairment Step Around Obstacles: Moderate Impairment Steps: Mild Impairment Total Score: 7  High Level Balance High Level Balance Activites: Head turns;Sudden stops High Level Balance Comments: LOB required asssistance to correct.    End of Session PT - End of Session Equipment Utilized During Treatment: Gait belt Activity Tolerance: Patient tolerated treatment well Patient left: in chair;with call bell/phone within reach;with family/visitor present Nurse Communication: Mobility status  GP     Aracelys Glade 05/01/2012, 4:16 PM  Malyssa Maris L. Eleisha Branscomb DPT [phone]

## 2012-05-02 ENCOUNTER — Inpatient Hospital Stay (HOSPITAL_COMMUNITY): Payer: Medicaid Other

## 2012-05-02 LAB — BASIC METABOLIC PANEL
BUN: 13 mg/dL (ref 6–23)
CO2: 30 mEq/L (ref 19–32)
Chloride: 92 mEq/L — ABNORMAL LOW (ref 96–112)
Creatinine, Ser: 0.89 mg/dL (ref 0.50–1.35)
GFR calc Af Amer: >90 mL/min (ref >90)
Glucose, Bld: 121 mg/dL — ABNORMAL HIGH (ref 70–99)
Potassium: 3.6 mEq/L (ref 3.5–5.1)

## 2012-05-02 LAB — PROTIME-INR: Prothrombin Time: 22.3 seconds — ABNORMAL HIGH (ref 11.6–15.2)

## 2012-05-02 LAB — CBC
HCT: 28.4 % — ABNORMAL LOW (ref 39.0–52.0)
Hemoglobin: 9.6 g/dL — ABNORMAL LOW (ref 13.0–17.0)
MCH: 31.8 pg (ref 26.0–34.0)
MCHC: 33.8 g/dL (ref 30.0–36.0)
MCV: 94 fL (ref 78.0–100.0)
RDW: 19.7 % — ABNORMAL HIGH (ref 11.5–15.5)

## 2012-05-02 MED ORDER — ENSURE PUDDING PO PUDG
1.0000 | Freq: Three times a day (TID) | ORAL | Status: DC
  Administered 2012-05-02 – 2012-05-03 (×4): 1 via ORAL

## 2012-05-02 MED ORDER — STARCH (THICKENING) PO POWD
ORAL | Status: DC
  Filled 2012-05-02: qty 227

## 2012-05-02 MED ORDER — WARFARIN SODIUM 5 MG PO TABS
5.0000 mg | ORAL_TABLET | Freq: Every day | ORAL | Status: DC
  Administered 2012-05-02: 5 mg via ORAL
  Filled 2012-05-02 (×2): qty 1

## 2012-05-02 NOTE — Progress Notes (Signed)
Pt's dsg has been changed twice this evening. Both times the dsg was completely saturated within 2-3 hours with yellowish-clear, malodorous fluid. Pt is becoming increasingly more agitated with his progress and states he's "sick of not knowing what's going on" and feels that "nothing is being done". Pt requests to speak with MD tomorrow at some point to discuss plan of care. Pt feels anxious that he is getting worse. Educated pt on importance of ambulation and use of incentive spirometry. Will continue to monitor.

## 2012-05-02 NOTE — Progress Notes (Signed)
NUTRITION FOLLOW UP  Intervention:    D/C Resource Breeze; add Ensure Pudding 3 times daily between meals (170 kcals, 4 gm protein per 4 oz cup) RD to follow for nutrition care plan  Nutrition Dx:   Increased nutrient needs related to post-op healing as evidenced by estimated nutrition needs, ongoing  Goal:   Oral intake with meals & supplements to meet >/= 90% of estimated nutrition needs, unmet  Monitor:   PO & supplemental intake, weight, labs, I/O's  Assessment:   Patient s/p MBSS 12/26.  Patient presented with mild pharyngeal phase dysphagia.  PO intake variable at 0-100% per flowsheet records.  Orders for Resource Breeze supplements 3 times daily between meals in place.  Height: Ht Readings from Last 1 Encounters:  04/23/12 5\' 8"  (1.727 m)    Weight Status:   Wt Readings from Last 1 Encounters:  05/02/12 183 lb 14.4 oz (83.416 kg)    Re-estimated needs:  Kcal: 2100-2300 Protein: 115-125 gm Fluid: 2.1-2.3 L  Skin: Intact  Diet Order: Cardiac, nectar thick liquids   Intake/Output Summary (Last 24 hours) at 05/02/12 0953 Last data filed at 05/02/12 0730  Gross per 24 hour  Intake    240 ml  Output    900 ml  Net   -660 ml    Last BM: 12/26  Labs:   Lab 05/02/12 0522 04/29/12 0510 04/28/12 0635  NA 129* 125* 125*  K 3.6 4.4 4.1  CL 92* 87* 88*  CO2 30 26 23   BUN 13 31* 30*  CREATININE 0.89 1.03 0.88  CALCIUM 7.7* 7.8* 7.7*  MG -- -- --  PHOS -- -- --  GLUCOSE 121* 94 170*    CBG (last 3)   Basename 04/29/12 2202  GLUCAP 93    Scheduled Meds:   . amiodarone  400 mg Oral BID  . bisacodyl  10 mg Oral Daily   Or  . bisacodyl  10 mg Rectal Daily  . docusate sodium  200 mg Oral Daily  . feeding supplement  1 Container Oral TID BM  . ferrous fumarate-b12-vitamic C-folic acid  1 capsule Oral TID PC  . food thickener   Oral UD  . levofloxacin  500 mg Oral Daily  . metoprolol tartrate  25 mg Oral BID  . pantoprazole  40 mg Oral Daily  .  warfarin  5 mg Oral q1800  . warfarin   Does not apply Once  . Warfarin - Physician Dosing Inpatient   Does not apply q1800    Continuous Infusions:   Kirkland Hun, RD, LDN Pager #: [phone] After-Hours Pager #: [phone]

## 2012-05-02 NOTE — Progress Notes (Signed)
Speech Language Pathology Dysphagia Treatment Patient Details Name: Jon [name] MRN: [id number] DOB: 09/24/1965 Today's Date: 05/02/2012 Time: 2440-1027 SLP Time Calculation (min): 15 min  Assessment / Plan / Recommendation Clinical Impression  Treatment focused on education and reinforcement of compensatory strategies following MBS 12/26. SLP demonstrated appropriate thickening of liquids. Daughter and wife verbalized understanding of all education. Patient noted to be coughing (dry) upon arrival, complaining of something tickling throat. Upon observation of oral cavity, noted what appears to be a bursted blister on posterior right tongue which is likely causing a cough response. RN made aware. Overall, current diet remains appropriate. Patient continues to require min verbal cueing for use of chin tuck despite ability to report strategy independently.     Diet Recommendation  Continue with Current Diet: Regular;Nectar-thick liquid    SLP Plan Continue with current plan of care   Pertinent Vitals/Pain n/a   Swallowing Goals  SLP Swallowing Goals Patient will consume recommended diet without observed clinical signs of aspiration with: Supervision/safety Swallow Study Goal #1 - Progress: Progressing toward goal Patient will utilize recommended strategies during swallow to increase swallowing safety with: Supervision/safety Swallow Study Goal #2 - Progress: Progressing toward goal  General Temperature Spikes Noted: No Respiratory Status: Room air Behavior/Cognition: Alert;Impulsive;Decreased sustained attention Oral Cavity - Dentition: Adequate natural dentition Patient Positioning: Upright in bed      Dysphagia Treatment Treatment focused on: Skilled observation of diet tolerance;Patient/family/caregiver education;Utilization of compensatory strategies Treatment Methods/Modalities: Skilled observation;Differential diagnosis Patient observed directly with PO's: Yes Type of  PO's observed: Nectar-thick liquids Feeding: Able to feed self Liquids provided via: Cup Type of cueing: Verbal (for chin tuck) Amount of cueing: Minimal   GO   Jon Lango MA, CCC-SLP [phone]   Jon [name] 05/02/2012, 4:20 PM

## 2012-05-02 NOTE — Progress Notes (Signed)
05/02/2012 3:04 PM Nursing note Pt. Wife and family at bedside, provided education on nectar thickened liquids and speech therapy recommendations for patient. Questions and concerns addressed. Family verbalized understanding. Will continue to monitor.  Jiyan Walkowski, Blanchard Kelch

## 2012-05-02 NOTE — Op Note (Signed)
NAMEMALIN, CERVINI NO.:  [id number]  MEDICAL RECORD NO.:  [id number]  LOCATION:  2010                         FACILITY:  [facility]  PHYSICIAN:  [name]. Dorris Fetch, M.D.DATE OF BIRTH:  1965-07-16  DATE OF PROCEDURE:  04/22/2012 DATE OF DISCHARGE:                              OPERATIVE REPORT   PREOPERATIVE DIAGNOSIS:  Type A aortic dissection.  POSTOPERATIVE DIAGNOSIS:  Type A aortic dissection.  PROCEDURE:  Emergency median sternotomy, axillary artery cannulation, femoral venous cannulation,  extracorporeal circulation, repair of type 1 aortic dissection, and replacement of aortic valve under deep hypothermic circulatory arrest with selective antegrade cerebral perfusion (Sorin Carbomedics CardioSEAL Valsalva valve conduit 27-mm valve size, 30-mm graft diameter), coronary artery bypass grafting x1 with saphenous vein graft to right coronary.  SURGEON:  [name]. Dorris Fetch, MD  ASSISTANT:  [name], MD  SECOND ASSISTANT:  [name], PA  ANESTHESIA:  General.  FINDINGS:  Grossly dilated ascending aorta approximately 7-8 cm in diameter.  Bicuspid aortic valve with fusion of the left and right cusps with thickening and sclerosis of the cusps.  Extension of the dissection into the left common carotid artery and the origin of the innominate artery,  dissection ended between the left common carotid and the left subclavian artery.  CLINICAL NOTE:  Mr. Hyun is a 46 year old gentleman who was transferred from the [facility] Emergency Room after presenting with a sudden onset of severe chest pain.  A CT angiogram showed a type 1 aortic dissection.  His blood pressure was treated and he was transferred to [facility] for definitive management.  The patient was advised to undergo emergency surgical repair.  He had a known bicuspid aortic valve.  The plan was to assess the valve intraoperatively to determine if the valve could be saved or whether it  would need to be replaced. The indications, risks, benefits, and alternatives were discussed in detail with the patient.  He understood the high-risk nature of the procedure, accepted the risk, and agreed to proceed.  OPERATIVE NOTE:  Mr. Kirby was brought to the operating room on April 22, 2012.  The Anesthesia Service placed a Swan-Ganz catheter and arterial blood pressure monitoring line.  He was anesthetized and intubated.  Intravenous antibiotics were administered.  A Foley catheter was placed.  Transesophageal echocardiogram was performed that showed a large ascending aortic aneurysm.  It was difficult to make out the valve leaflets because of a large flap in the proximal aorta.  There was a mild aortic insufficiency.  The chest, abdomen, and legs were prepped and draped in the usual sterile fashion.  An incision was made in the right deltopectoral groove.  Dissection was carried down the axillary artery was identified.  Care was taken to avoid traction on the brachial plexus.  The patient was given 5000 units of heparin.  Clamps were placed proximally and distally on the axillary artery and an arteriotomy was made.  A 7-mm Hemashield graft was sewn to this arteriotomy with a running 5-0 Prolene suture.  The graft was de- aired and cannulated to the arterial portion of the bypass circuit.  A median sternotomy was performed.  The aorta essentially occupied the entire visible  area at about 7-8 cm in diameter.  There was purplish discoloration with blood flow visible through the aortic wall.  There was a small bloody effusion.  The atrium really was not accessible due to the size of the aneurysm.  A femoral venous cannula was inserted using the Seldinger technique and advanced into the right atrium. Cardiopulmonary bypass was commenced and immediate cooling was begun.  A retrograde cardioplegia cannula was placed via pursestring in the right atrium and directed into the  coronary sinus.  A temperature probe was placed in myocardial septum and a foam pad was placed in the pericardium to insulate the heart.  As the patient cooled the heart fibrillated. The aorta was crossclamped. Cardiac arrest was achieved with cold retrograde cardioplegia and topical iced saline. Additional cardioplegia was administered at 20 minute intervals until the heart was reperfused. An aortotomy was made. The type 1 dissection extended into the sinuses of valsalva proximally but the coronary ostia were intact. There was a bicuspid aortic valve with fusion of the left and right cusps. The noncoronary cusp was slightly oversized. The fused leaflet was thickened and severely sclerotic and the decision was made to replace the aortic valve. The leaflets were excised and the aorta was resected to the annulus preserving buttons of aorta around the left and right coronaries.   By this time the patient had cooled to 20 degrees C. He was placed in Trendelenberg position and given steroids. The pump was stopped and the patient exsanguinated. A clamp was placed on the innominate artery. The cross clamp was removed and the arch inspected. The tear had sheared off the intima at the origin of the innominate artery. The intima was intact in the innominate ~ 3 mm from the origin. The flap extended into the left common carotid. The tear terminated between the left CCA and the left subclavian. Selective antegrade cerebral perfusion was initiated through the axillary cannula. A 27 mm Carbomedics valved conduit was selected based on annular size. This had a 30 mm graft attached. The distal portion of the graft was cutoff and used for the distal anastamosis. A 7 mm hemashield graft was sewn onto the larger graft as a bridge to the LCCA. The intima of the LCCA was reattached to the adventitia with Bioglue. The distal anastomosis was performed end-to-end with a running 4-0 Prolene suture. Teflon felt was used to support the  distal aorta. The graft to LCCA anastamosis then was done using a running 5-0 Prolene suture, again with a teflon felt strip. The graft was not long enough to allow a short side branch to the innominate artery, so a second piece of 30 mm Hemashield graft was prepared again using a 7 mm graft as a side branch. An end-to-end graft to graft anastomosis was done using a 4-0 Prolene. An end-to-end graft to innominate artery anastomosis was done using the side branch. At this point, the clamp was removed from the innominate artery, flow was restored to the body and a cross clamp was placed across the graft. The total circulatory arrest time was 87 minutes with selective antegrade cerebral perfusion being given for 68 minutes of that time.   Systemic rewarming was initiated. 2-0 Ethibond horizontal mattress sutures were placed circumferentially around the aortic annulus and then brought throught the sewing ring of the valve. The valved conduit was lowered into place and the sutures were tied. The annulus was carefully inspected to ensure there were no gaps.   A thermal cautery was  used to create a hole in the graft at the site for the left coronary anastomosis. The button was trimmed and the anastomosis performed with a running 6-0 Prolene suture. The two ends of the grafts then were sewn together with a running 4-0 prolene suture. A hole was made in the valsalva portion of the graft anteriorly for the right coronary button and again a 6-0 Prolene was used to sew the button to the graft. Coseal was applied at the coronary anastomoses. A warm dose of cardioplegia was administered through the retrograde cannula. After de-airing the aortic cross clamp was removed. The total cross clamp time was 193 minutes.  While rewarming was completed, an inspection of all suture lines was done to inspect for hemostasis and additional sutures were placed as needed. Epicardial pacing wires were placed on the right atrium and right  ventricle and DDD pacing was initiated. The retrograde cannula and LV vent were removed. When the patient had rewarmed to 37 degrees C, he was weaned from cardiopulmonary bypass without difficulty. Total bypass time was 273 minutes. The initial cardiac index was greater than 2 Liters per minute per minute squared. TEE showed good function of the prosthetic valve with mild global hypokinesis. A test dose of protamine was administered and was well tolerated. There was significant bleeding proximally and it was determined to be coming from the RCA button. An attempt was made to control this with a 6-0 Prolene suture but it became evident that the bleeding was from the button itself not the anastomosis. It was decided to re cross clamp and place a saphenous vein graft to the RCA.   Full bypass was resumed and a cardioplegia cannula was placed in the graft. A segment of saphenous vein was harvested from the right groin.After harvesting the vein, the aorta was cross clamped and cardiac arrest was achieved with cold antegrade cardioplegia and topical iced saline. 1 Liter of cardioplegia was administered, there was a rapid diastolic arrest and adequate myocardial septal cooling. The right coronary button was inspected and it was clear the intima and adventitia had separated. It was taken down and the proximal right coronary was divided and beveled. The saphenous vein was cut to length and a reversed segment of the vein was sewn end to end to the RCA and end to side to the aortic graft as an interposition graft. After completing the anastomoses the patient was again placed in Trendelenburg position and the heart and aorta were de-aired. The cross clamp was removed. The cross clamp time was 35 minutes.   The patient had been maintained at normothermia. The patient was weaned from bypass the second time without difficulty. The second bypass time was 98 minutes. After weaning from bypass a test dose of protamine was  administered and was well tolerated. The remainder of the protamine was administered without incident. The venous cannula was removed and pressure held on the groin for 30 minutes. The graft to the axillary artery was clamped and divided and oversewn with a 5-0 Prolene suture. The axillary incision was closed in the standard fashion.  There was ongoing coagulopathic bleeding treated with FFP, platelets and packed red blood cells. Despite these measures there was no clot formation noted. Finally Novo seven was administered and there was improved hemostasis. The chest was copiously irrigated with warm saline. The pericardium was closed over the aortic graft but could not be closed over the heart. Two mediastinal chest tubes were placed. The sternum was closed with interrupted  heavy gauge stainless steel wires. Sternal closure was tolerated well hemodynamically. The pectoralis fascia, subcutaneous tissue and skin were closed in standard fashion.  The patient remained hemodynamically stable and was transported from the operating room to the surgical ICU in critical, but stable condition.  All sponge, needle and instrument counts were correct at the end of the procedure.     [name] Dorris Fetch, M.D.     SCH/MEDQ  D:  05/01/2012  T:  05/02/2012  Job:  [id number]

## 2012-05-02 NOTE — Progress Notes (Signed)
Physical Therapy Treatment Patient Details Name: Jon [name] MRN: [id number] DOB: 12/12/1965 Today's Date: 05/02/2012 Time: 4098-1191 PT Time Calculation (min): 25 min  PT Assessment / Plan / Recommendation Comments on Treatment Session  Pt s/p AAA and CABG with decr mobility secondary to dizziness post surgery as well as visual disturbance.  Initiated x1 exercises.  Pt understands that he needs to use RW at all times and have 24 hour asssist on d/c.  Pt states he thinks he is going to a NH at d/c to get therapy.  If home, HHPT f/u recommented and 24 hour care.      Follow Up Recommendations  Home health PT;Supervision/Assistance - 24 hour                 Equipment Recommendations  Rolling walker with 5" wheels       Frequency Min 3X/week   Plan Discharge plan remains appropriate;Frequency remains appropriate    Precautions / Restrictions Precautions Precautions: Fall Restrictions Weight Bearing Restrictions: No   Pertinent Vitals/Pain VSS, No pain    Mobility  Bed Mobility Bed Mobility: Supine to Sit;Sit to Supine Supine to Sit: 5: Supervision;HOB flat Sit to Supine: 5: Supervision;HOB flat Details for Bed Mobility Assistance: Performed Modified Hallpike bil with negative test bil.  Pt with + head thrust to right suggesting right hypofunction.  Initiated x1 exercises with dizziness 6/10 after 25 seconds.  Pt performed again for 30 seconds.  Pt understands to perform exercises in sitting 3 x ; 5xday.   Transfers Transfers: Sit to Stand;Stand to Sit Sit to Stand: 4: Min guard;With upper extremity assist;From bed Stand to Sit: 4: Min guard;With upper extremity assist;To bed Details for Transfer Assistance: min guard for safety.   Ambulation/Gait Ambulation/Gait Assistance: Not tested (comment) Stairs: No Wheelchair Mobility Wheelchair Mobility: No     PT Goals Acute Rehab PT Goals PT Goal: Sit to Stand - Progress: Progressing toward goal PT Goal: Stand to  Sit - Progress: Progressing toward goal Additional Goals Additional Goal #2: Pt to perform x1 exercises with dizziness less than 3/10 in standing. PT Goal: Additional Goal #2 - Progress: Goal set today  Visit Information  Last PT Received On: 05/02/12 Assistance Needed: +1    Subjective Data  Subjective: "I have had three head injuries." Patient Stated Goal: Return to work as Production designer, theatre/television/film  Overall Cognitive Status: Appears within functional limits for tasks assessed/performed Arousal/Alertness: Awake/alert Orientation Level: Appears intact for tasks assessed Behavior During Session: [facility] for tasks performed    Balance  Static Standing Balance Static Standing - Balance Support: During functional activity;No upper extremity supported Static Standing - Level of Assistance: 5: Stand by assistance Static Standing - Comment/# of Minutes: 25 seconds static standing without LOB.    End of Session PT - End of Session Equipment Utilized During Treatment: Gait belt Activity Tolerance: Patient tolerated treatment well Patient left: in bed;with call bell/phone within reach;with bed alarm set Nurse Communication: Mobility status       INGOLD,Riyah Bardon 05/02/2012, 1:01 PM  [facility] [phone] (pager)

## 2012-05-02 NOTE — Progress Notes (Signed)
05/02/2012 10:30 AM Nursing note Pt. Educated on proper use of thickened liquids and how to swallow and take pills per speech recommendations. Pt. Able to perform return demonstration and verbalized understanding.  Pt. Currently prefers to take medications whole in pudding followed by nectar thickened liquids, states it was easier to swallow. Will continue to monitor.  Takhia Spoon, Blanchard Kelch

## 2012-05-02 NOTE — Progress Notes (Addendum)
[name]            [place] [address]          [phone]     10 Days Post-Op  Procedure(s) (LRB): THORACIC ASCENDING ANEURYSM REPAIR (AAA) (N/A) CORONARY ARTERY BYPASS GRAFTING (CABG) (N/A) INTRAOPERATIVE TRANSESOPHAGEAL ECHOCARDIOGRAM (N/A) Subjective: Feels ok, coughing , did not receive nectar thick liquids??? No change in vision sx Objective  Telemetry afib, slightly rapid at times(100's)  Temp:  [97.4 F (36.3 C)-97.9 F (36.6 C)] 97.4 F (36.3 C) (12/26 2113) Pulse Rate:  [66-74] 74  (12/26 2201) Resp:  [18-20] 20  (12/26 2113) BP: (112-125)/(66-75) 120/72 mmHg (12/26 2201) SpO2:  [96 %] 96 % (12/26 2113) Weight:  [183 lb 14.4 oz (83.416 kg)] 183 lb 14.4 oz (83.416 kg) (12/27 0455)   Intake/Output Summary (Last 24 hours) at 05/02/12 0738 Last data filed at 05/01/12 2114  Gross per 24 hour  Intake      0 ml  Output    600 ml  Net   -600 ml       General appearance: alert, cooperative and no distress Heart: irregularly irregular rhythm Lungs: coarse throughout Abdomen: soft, + BS Extremities: mild edema Wound: right thigh dressing just changed  Lab Results:  Basename 05/02/12 0522  NA 129*  K 3.6  CL 92*  CO2 30  GLUCOSE 121*  BUN 13  CREATININE 0.89  CALCIUM 7.7*  MG --  PHOS --   No results found for this basename: AST:2,ALT:2,ALKPHOS:2,BILITOT:2,PROT:2,ALBUMIN:2 in the last 72 hours No results found for this basename: LIPASE:2,AMYLASE:2 in the last 72 hours  Basename 05/02/12 0522  WBC 19.8*  NEUTROABS --  HGB 9.6*  HCT 28.4*  MCV 94.0  PLT 237   No results found for this basename: CKTOTAL:4,CKMB:4,TROPONINI:4 in the last 72 hours No components found with this basename: POCBNP:3 No results found for this basename: DDIMER in the last 72 hours No results found for this basename: HGBA1C in the last 72 hours No results found for this basename: CHOL,HDL,LDLCALC,TRIG,CHOLHDL in the last 72 hours No  results found for this basename: TSH,T4TOTAL,FREET3,T3FREE,THYROIDAB in the last 72 hours No results found for this basename: VITAMINB12,FOLATE,FERRITIN,TIBC,IRON,RETICCTPCT in the last 72 hours  Medications: Scheduled    . amiodarone  400 mg Oral BID  . bisacodyl  10 mg Oral Daily   Or  . bisacodyl  10 mg Rectal Daily  . docusate sodium  200 mg Oral Daily  . feeding supplement  1 Container Oral TID BM  . ferrous fumarate-b12-vitamic C-folic acid  1 capsule Oral TID PC  . levofloxacin  500 mg Oral Daily  . metoprolol tartrate  25 mg Oral BID  . pantoprazole  40 mg Oral Daily  . warfarin  2.5 mg Oral q1800  . warfarin   Does not apply Once  . Warfarin - Physician Dosing Inpatient   Does not apply q1800     Radiology/Studies:  Dg Swallowing Func-speech Pathology  05/01/2012  Carolan Shiver, CCC-SLP     05/01/2012  2:24 PM Objective Swallowing Evaluation: Modified Barium Swallowing Study   Patient Details  Name: Jon [name] MRN: [id number] Date of Birth: 1965-12-16  Today's Date: 05/01/2012 Time: 1210-1240 SLP Time Calculation (min): 30 min  Past Medical History:  Past Medical History  Diagnosis Date  . Anxiety    Past Surgical History:  Past Surgical History  Procedure Date  . Cardiac catheterization   . Rotator cuff repair     12 years ago, Left  . Thoracic aortic aneurysm repair 04/22/2012    Procedure: THORACIC ASCENDING ANEURYSM REPAIR (AAA);  Surgeon:  [name], MD;  Location: [facility] OR;  Service: Open Heart  Surgery;  Laterality: N/A;  . Coronary artery bypass graft 04/22/2012    Procedure: CORONARY ARTERY BYPASS GRAFTING (CABG);  Surgeon:  [name], MD;  Location: [facility] OR;  Service: Open Heart  Surgery;  Laterality: N/A;  times one with right greater  saphenous vein graft  . Intraoperative transesophageal echocardiogram 04/22/2012    Procedure: INTRAOPERATIVE TRANSESOPHAGEAL ECHOCARDIOGRAM;   Surgeon: [name], MD;  Location: [facility] OR;  Service:   Open Heart Surgery;  Laterality: N/A;   HPI:  46 y/o male with history of aortic stenosis since childhood who  developed sudden onset of severe CP.  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  Patient  transferred to [facility] for Thoracic ascending aneurysm repair, CABG  and Intraopertive Transesophageal Echocardiogram.  CXR indicates  Right lung infliltrate . Pt has been followed by SLP to address  dysphagia s/p surgery.  Pt has not been tolerating thin liquids,  with consistent s/s aspiration.  Plan was for FEES today, but due  to instrument malfunction, MBS was ordered as an alternative.        Assessment / Plan / Recommendation Clinical Impression  Dysphagia Diagnosis: Mild pharyngeal phase dysphagia Clinical impression: Pt presents with a pharyngeal dysphagia,  isolated to inadequate laryngeal closure during the pharyngeal  phase.  This impairment leads to consistent aspiration of thin  liquids, accompanied by a nonprotective cough response.  Postural  adjustments (head turn to left and right) increased aspiration; a  chin tuck resulted in liquids penetrating the larynx but not  descending below the vocal cords.  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  After lengthy  discussion with pt and spouse, rec: nectar-thick liquids OR thin  liquids with use of a chin-tuck; meds whole with puree; SLP f/u  for swallowing therapy after D/C from hospital. Spouse is in  agreement; pt with impaired insight into circumstances.  SLP will  follow here until D/C.    Treatment Recommendation  Therapy as outlined in treatment plan below    Diet Recommendation Regular;Nectar-thick liquid (and thin with  chin tuck)   Liquid Administration via: Cup Medication Administration: Whole meds with puree Supervision: Patient able to self feed;Intermittent supervision  to cue for compensatory strategies Compensations: Slow rate;Small sips/bites Postural Changes and/or Swallow  Maneuvers: Chin tuck    Other  Recommendations Oral Care Recommendations: Oral care BID   Follow Up Recommendations       Frequency and Duration min 2x/week  2 weeks   Pertinent Vitals/Pain none    SLP Swallow Goals Patient will consume recommended diet without observed clinical  signs of aspiration with: Supervision/safety Swallow Study Goal #1 - Progress: Progressing toward goal Patient will utilize recommended strategies during swallow to  increase swallowing safety with: Supervision/safety Swallow Study Goal #2 - Progress: Progressing toward goal   General Date of Onset: 04/22/12 HPI: 46 y/o male with history of aortic stenosis since childhood  who developed sudden onset of severe CP.  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   Patient transferred to [facility] for Thoracic ascending aneurysm repair,  CABG  and Intraopertive Transesophageal Echocardiogram.  CXR  indicates Right lung infliltrate . Pt has been followed by SLP to  address dysphagia s/p surgery.  Pt has not been tolerating thin  liquids, with consistent s/s aspiration.  Plan was for FEES  today, but due to insturment malfunction, MBS was ordered as an  alternative.    Type of Study: Modified Barium Swallowing Study Reason for Referral: Objectively evaluate swallowing function Diet Prior to this Study: Regular;Nectar-thick liquids Temperature Spikes Noted: No Respiratory Status: Room air History of Recent Intubation: Yes Behavior/Cognition: Alert;Impulsive;Decreased sustained attention Oral Cavity - Dentition: Adequate natural dentition Oral Motor / Sensory Function: Within functional limits Self-Feeding Abilities: Able to feed self Patient Positioning: Upright in chair Baseline Vocal Quality: Hoarse;Low vocal intensity Volitional Cough: Weak Volitional Swallow: Able to elicit Anatomy: Within functional limits Pharyngeal Secretions: Not observed secondary MBS    Reason for Referral Objectively evaluate swallowing function    Oral Phase Oral Preparation/Oral Phase Oral Phase: WFL   Pharyngeal Phase Pharyngeal Phase Pharyngeal Phase: Impaired Pharyngeal - Thin Pharyngeal - Thin Cup: Reduced airway/laryngeal  closure;Penetration/Aspiration during swallow;Moderate  aspiration;Compensatory strategies attempted (Comment) (head turn  left and right; mendelsohn maneuver, chin tuck) Penetration/Aspiration details (thin cup): Material enters  airway, passes BELOW cords and not ejected out despite cough  attempt by patient  Cervical Esophageal Phase    GO   Amanda L. Brandon, [place] CCC/SLP Pager [phone]  Cervical Esophageal Phase Cervical Esophageal Phase: Leonarda Salon         Blenda Mounts Laurice 05/01/2012, 2:22 PM      INR:2.05 Will add last result for INR, ABG once components are confirmed Will add last 4 CBG results once components are confirmed  Assessment/Plan: S/P Procedure(s) (LRB): THORACIC ASCENDING ANEURYSM REPAIR (AAA) (N/A) CORONARY ARTERY BYPASS GRAFTING (CABG) (N/A) INTRAOPERATIVE TRANSESOPHAGEAL ECHOCARDIOGRAM (N/A)  1. afib with CVR for the most part, increase coumadin dose, on amio 2 timing of brain MRI for opthalmology eval 3 leukocytosis stable 4 sodium improved 5 cont levoquin 6 cont rehab 7 poss d/csoon   LOS: 10 days    Jon [name],Jon [name] 12/27/20137:38 AM    Events of past several days noted. He still is having blurry vision, mostly near field. He says it may be slightly better today but only slightly. Will do MR of brain as per Ophthamology's recommendation

## 2012-05-03 ENCOUNTER — Inpatient Hospital Stay (HOSPITAL_COMMUNITY): Payer: Medicaid Other

## 2012-05-03 LAB — PROTIME-INR: INR: 1.94 — ABNORMAL HIGH (ref 0.00–1.49)

## 2012-05-03 MED ORDER — METOPROLOL TARTRATE 25 MG PO TABS: 25.0000 mg | ORAL_TABLET | Freq: Two times a day (BID) | ORAL | Status: DC

## 2012-05-03 MED ORDER — PANTOPRAZOLE SODIUM 40 MG PO TBEC: 40.0000 mg | DELAYED_RELEASE_TABLET | Freq: Every day | ORAL | Status: DC

## 2012-05-03 MED ORDER — MENTHOL 3 MG MT LOZG: 1.0000 | LOZENGE | OROMUCOSAL | Status: DC | PRN

## 2012-05-03 MED ORDER — DM-GUAIFENESIN ER 30-600 MG PO TB12: 1.0000 | ORAL_TABLET | Freq: Two times a day (BID) | ORAL | Status: DC | PRN

## 2012-05-03 MED ORDER — STARCH (THICKENING) PO POWD: ORAL | Status: DC

## 2012-05-03 MED ORDER — LEVOFLOXACIN 500 MG PO TABS: 500.0000 mg | ORAL_TABLET | Freq: Every day | ORAL | Status: DC

## 2012-05-03 MED ORDER — WARFARIN SODIUM 5 MG PO TABS: 5.0000 mg | ORAL_TABLET | Freq: Every day | ORAL | Status: DC

## 2012-05-03 MED ORDER — AMIODARONE HCL 400 MG PO TABS: 200.0000 mg | ORAL_TABLET | Freq: Two times a day (BID) | ORAL | Status: DC

## 2012-05-03 MED ORDER — TRAMADOL HCL 50 MG PO TABS: 50.0000 mg | ORAL_TABLET | Freq: Four times a day (QID) | ORAL | Status: DC | PRN

## 2012-05-03 NOTE — Progress Notes (Signed)
Pt discharged per MD order and Protocol. All discharge instructions reviewed with patient and his wife, all questions answered.Pt aware of all follow up appointments.

## 2012-05-03 NOTE — Progress Notes (Addendum)
[address]            [place] [address]          [phone]     11 Days Post-Op  Procedure(s) (LRB): THORACIC ASCENDING ANEURYSM REPAIR (AAA) (N/A) CORONARY ARTERY BYPASS GRAFTING (CABG) (N/A) INTRAOPERATIVE TRANSESOPHAGEAL ECHOCARDIOGRAM (N/A) Subjective: Feels ok, without new issuse  Objective  Telemetry junct brady/sinus  Temp:  [98.4 F (36.9 C)-98.7 F (37.1 C)] 98.4 F (36.9 C) (12/28 0417) Pulse Rate:  [62-87] 62  (12/28 0417) Resp:  [19-20] 20  (12/28 0417) BP: (97-130)/(62-75) 102/75 mmHg (12/28 0417) SpO2:  [93 %-99 %] 96 % (12/28 0417) Weight:  [183 lb 10.3 oz (83.3 kg)] 183 lb 10.3 oz (83.3 kg) (12/28 0417)   Intake/Output Summary (Last 24 hours) at 05/03/12 0740 Last data filed at 05/03/12 0417  Gross per 24 hour  Intake    780 ml  Output   1276 ml  Net   -496 ml       General appearance: alert, cooperative and no distress Heart: regular rate and rhythm Lungs: coarse and somewhat diminished throughout Abdomen: benign Extremities: minor edema Wound: minor serous drainage right thigh, no cellulitis, sternal incision healing well  Lab Results:  Basename 05/02/12 0522  NA 129*  K 3.6  CL 92*  CO2 30  GLUCOSE 121*  BUN 13  CREATININE 0.89  CALCIUM 7.7*  MG --  PHOS --   No results found for this basename: AST:2,ALT:2,ALKPHOS:2,BILITOT:2,PROT:2,ALBUMIN:2 in the last 72 hours No results found for this basename: LIPASE:2,AMYLASE:2 in the last 72 hours  Basename 05/02/12 0522  WBC 19.8*  NEUTROABS --  HGB 9.6*  HCT 28.4*  MCV 94.0  PLT 237   No results found for this basename: CKTOTAL:4,CKMB:4,TROPONINI:4 in the last 72 hours No components found with this basename: POCBNP:3 No results found for this basename: DDIMER in the last 72 hours No results found for this basename: HGBA1C in the last 72 hours No results found for this basename: CHOL,HDL,LDLCALC,TRIG,CHOLHDL in the last 72 hours No results  found for this basename: TSH,T4TOTAL,FREET3,T3FREE,THYROIDAB in the last 72 hours No results found for this basename: VITAMINB12,FOLATE,FERRITIN,TIBC,IRON,RETICCTPCT in the last 72 hours  Medications: Scheduled    . amiodarone  400 mg Oral BID  . bisacodyl  10 mg Oral Daily   Or  . bisacodyl  10 mg Rectal Daily  . docusate sodium  200 mg Oral Daily  . feeding supplement  1 Container Oral TID BM  . ferrous fumarate-b12-vitamic C-folic acid  1 capsule Oral TID PC  . food thickener   Oral UD  . levofloxacin  500 mg Oral Daily  . metoprolol tartrate  25 mg Oral BID  . pantoprazole  40 mg Oral Daily  . warfarin  5 mg Oral q1800  . warfarin   Does not apply Once  . Warfarin - Physician Dosing Inpatient   Does not apply q1800     Radiology/Studies:  Mr Brain Wo Contrast  05/02/2012  *RADIOLOGY REPORT*  Clinical Data: Blurred vision.  Previous aortic dissection. Surgical repair.  MRI HEAD WITHOUT CONTRAST  Technique:  Multiplanar, multiecho pulse sequences of the brain and surrounding structures were obtained according to standard protocol without intravenous contrast.  Comparison: Head CT 04/25/2012  Findings: The brainstem and cerebellum are normal.  The cerebral hemispheres are normal except for a questionable 3 mm infarction affecting the  surface of the right occipital lobe.  This appearance could be due to artifact.  No mass lesion, acute hemorrhage, hydrocephalus or extra-axial collection.  There is a punctate focus of hemosiderin in the right frontal white matter, not likely significant.  No pituitary mass.  No inflammatory sinus disease. Major vessels are patent into the brain.  IMPRESSION: The study is normal, with the exception of a questionable punctate acute/subacute infarction along the surface of the right occipital lobe inferiorly.  This could possibly explain the patient's symptoms.  However, this is an area and appearance that could be artifactual related to artifact frequently  seen along the surface of the brain adjacent to dense bone.  Certainly, there is no large, definite or widespread pathology.   Original Report Authenticated By: Paulina Fusi, M.D.    Dg Swallowing Func-speech Pathology  05/01/2012  Carolan Shiver, CCC-SLP     05/01/2012  2:24 PM Objective Swallowing Evaluation: Modified Barium Swallowing Study   Patient Details  Name: Laiden Milles MRN: [id number] Date of Birth: [birth year]  Today's Date: 05/01/2012 Time: 1210-1240 SLP Time Calculation (min): 30 min  Past Medical History:  Past Medical History  Diagnosis Date  . Anxiety    Past Surgical History:  Past Surgical History  Procedure Date  . Cardiac catheterization   . Rotator cuff repair     12 years ago, Left  . Thoracic aortic aneurysm repair 04/22/2012    Procedure: THORACIC ASCENDING ANEURYSM REPAIR (AAA);  Surgeon:  [name], MD;  Location: [facility] OR;  Service: Open Heart  Surgery;  Laterality: N/A;  . Coronary artery bypass graft 04/22/2012    Procedure: CORONARY ARTERY BYPASS GRAFTING (CABG);  Surgeon:  [name], MD;  Location: [facility] OR;  Service: Open Heart  Surgery;  Laterality: N/A;  times one with right greater  saphenous vein graft  . Intraoperative transesophageal echocardiogram 04/22/2012    Procedure: INTRAOPERATIVE TRANSESOPHAGEAL ECHOCARDIOGRAM;   Surgeon: [name], MD;  Location: [facility] OR;  Service:  Open Heart Surgery;  Laterality: N/A;   HPI:  46 y/o male with history of aortic stenosis since childhood who  developed sudden onset of severe CP.  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  Patient  transferred to [facility] for Thoracic ascending aneurysm repair, CABG  and Intraopertive Transesophageal Echocardiogram.  CXR indicates  Right lung infliltrate . Pt has been followed by SLP to address  dysphagia s/p surgery.  Pt has not been tolerating thin liquids,  with consistent s/s aspiration.  Plan was for FEES today, but due  to  instrument malfunction, MBS was ordered as an alternative.        Assessment / Plan / Recommendation Clinical Impression  Dysphagia Diagnosis: Mild pharyngeal phase dysphagia Clinical impression: Pt presents with a pharyngeal dysphagia,  isolated to inadequate laryngeal closure during the pharyngeal  phase.  This impairment leads to consistent aspiration of thin  liquids, accompanied by a nonprotective cough response.  Postural  adjustments (head turn to left and right) increased aspiration; a  chin tuck resulted in liquids penetrating the larynx but not  descending below the vocal cords.  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  After lengthy  discussion with pt and spouse, rec: nectar-thick liquids OR thin  liquids with use of a chin-tuck; meds whole with puree; SLP f/u  for swallowing therapy after D/C from hospital. Spouse is in  agreement; pt with impaired  insight into circumstances.  SLP will  follow here until D/C.    Treatment Recommendation  Therapy as outlined in treatment plan below    Diet Recommendation Regular;Nectar-thick liquid (and thin with  chin tuck)   Liquid Administration via: Cup Medication Administration: Whole meds with puree Supervision: Patient able to self feed;Intermittent supervision  to cue for compensatory strategies Compensations: Slow rate;Small sips/bites Postural Changes and/or Swallow Maneuvers: Chin tuck    Other  Recommendations Oral Care Recommendations: Oral care BID   Follow Up Recommendations       Frequency and Duration min 2x/week  2 weeks   Pertinent Vitals/Pain none    SLP Swallow Goals Patient will consume recommended diet without observed clinical  signs of aspiration with: Supervision/safety Swallow Study Goal #1 - Progress: Progressing toward goal Patient will utilize recommended strategies during swallow to  increase swallowing safety with: Supervision/safety Swallow Study Goal #2 - Progress: Progressing toward goal   General Date of  Onset: 04/22/12 HPI: 46 y/o male with history of aortic stenosis since childhood  who developed sudden onset of severe CP.  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   Patient transferred to [facility] for Thoracic ascending aneurysm repair,  CABG and Intraopertive Transesophageal Echocardiogram.  CXR  indicates Right lung infliltrate . Pt has been followed by SLP to  address dysphagia s/p surgery.  Pt has not been tolerating thin  liquids, with consistent s/s aspiration.  Plan was for FEES  today, but due to insturment malfunction, MBS was ordered as an  alternative.    Type of Study: Modified Barium Swallowing Study Reason for Referral: Objectively evaluate swallowing function Diet Prior to this Study: Regular;Nectar-thick liquids Temperature Spikes Noted: No Respiratory Status: Room air History of Recent Intubation: Yes Behavior/Cognition: Alert;Impulsive;Decreased sustained attention Oral Cavity - Dentition: Adequate natural dentition Oral Motor / Sensory Function: Within functional limits Self-Feeding Abilities: Able to feed self Patient Positioning: Upright in chair Baseline Vocal Quality: Hoarse;Low vocal intensity Volitional Cough: Weak Volitional Swallow: Able to elicit Anatomy: Within functional limits Pharyngeal Secretions: Not observed secondary MBS    Reason for Referral Objectively evaluate swallowing function   Oral Phase Oral Preparation/Oral Phase Oral Phase: WFL   Pharyngeal Phase Pharyngeal Phase Pharyngeal Phase: Impaired Pharyngeal - Thin Pharyngeal - Thin Cup: Reduced airway/laryngeal  closure;Penetration/Aspiration during swallow;Moderate  aspiration;Compensatory strategies attempted (Comment) (head turn  left and right; mendelsohn maneuver, chin tuck) Penetration/Aspiration details (thin cup): Material enters  airway, passes BELOW cords and not ejected out despite cough  attempt by patient  Cervical Esophageal Phase    GO   Amanda L. [place], [place] CCC/SLP Pager  [phone]  Cervical Esophageal Phase Cervical Esophageal Phase: Leonarda Salon         Blenda Mounts Laurice 05/01/2012, 2:22 PM      INR:1.94  Will add last result for INR, ABG once components are confirmed Will add last 4 CBG results once components are confirmed  Assessment/Plan: S/P Procedure(s) (LRB): THORACIC ASCENDING ANEURYSM REPAIR (AAA) (N/A) CORONARY ARTERY BYPASS GRAFTING (CABG) (N/A) INTRAOPERATIVE TRANSESOPHAGEAL ECHOCARDIOGRAM (N/A)  1. Stable, cont pulm toilet, check cxr to re-eval infiltrate, ? Aspiration worsening or poss amiodarone as BS are more congested 2 cont ac rx- ? Why INR decreasing- will give 5 of coumadin today 3 MRI may explain visual sx. Will need full outpt opth evaluation/?corrective lenses    LOS: 11 days    GOLD,WAYNE E 12/28/20137:40 AM  MR findings noted- may have had  a small occipital infarct. 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  patient seen and examined. He and his wife are anxious for him to go home. Mrs. Mckey feels she can provide the 24 hour assistance and care needed.  Will d/c home with HHPT/OT/Speech Will need BID dressing changes to right groin Will d/c on 5 mg coumadin daily. Will need INR checked on Monday- will arrange [facility]

## 2012-05-03 NOTE — Discharge Summary (Signed)
[address]            [place] [address]          [phone]      Jon [name] [birth year] 46 y.o. [id number]  04/22/2012   Jon Slot, MD  Acute Type I aortic dissection Aortic stenosis Bicuspid aortic valve  HPI: 46 yo WM with history of aortic stenosis since childhood who developed sudden onset of severe CP at ~6:30 PM yesterday. He thought initially it might resolve, but it continued to worsen and he went to [facility] ED. CT angiogram showed a Type A dissection. He was initially hypertensive, but BP responded to NTP. He still is having 3/10 CP. He denies SOB. He was transferred emergently to [facility] for surgical management by Dr. Charlett Lango.  PMH  Aortic stenosis since childhood  No past surgical history on file.  No family history on file.  Social History: does not have a smoking history on file. He does not have any smokeless tobacco history on file. His alcohol and drug histories not on file.History of EtOH in past, quit several years ago  Allergies:  Allergies   Allergen  Reactions   .  Dilaudid (Hydromorphone Hcl)  Other (See Comments)     High doses cause itching   .  Penicillins  Other (See Comments)     Unknown      Hospital Course:  The patient was admitted in transfer from the [facility] emergency department and taken to the operating room where he underwent the following procedure: OPERATIVE REPORT  PREOPERATIVE DIAGNOSIS: Type A aortic dissection.  POSTOPERATIVE DIAGNOSIS: Type A aortic dissection.  PROCEDURE: Emergency median sternotomy, axillary artery cannulation,  femoral venous cannulation, extracorporeal circulation, repair of type  1 aortic dissection, and replacement of aortic valve under deep  hypothermic circulatory arrest with selective antegrade cerebral  perfusion (Sorin Carbomedics CardioSEAL Valsalva valve conduit 27-mm  valve size, 30-mm graft diameter), coronary artery  bypass grafting x1  with saphenous vein graft to right coronary.  SURGEON: [name]. Dorris Fetch, MD  ASSISTANT: [name], MD  SECOND ASSISTANT: [name], PA  ANESTHESIA: General.  FINDINGS: Grossly dilated ascending aorta approximately 7-8 cm in  diameter. Bicuspid aortic valve with fusion of the left and right cusps  with thickening and sclerosis of the cusps. Extension of the dissection  into the left common carotid artery and the origin of the innominate  artery, dissection ended between the left common carotid and the left  subclavian artery. The patient was taken to the surgical intensive care unit in critical condition.  Post operative hospital course:  The patient was initially kept sedated on the vent overnight. He did require blood replacement for expected acute blood loss anemia as well as thrombocytopenia and coagulopathy.. He initially did require some low dose inotropic support but this was weaned without significant difficulty as was the ventilator on postoperative day #1. From a neurological viewpoint he has had some postoperative difficulties with his vision. An MRI did reveal some possible changes in the region of the occipital lobe. It is felt that this study may or may not explain his symptoms. He additionally has been seen by ophthalmology and will require outpatient evaluation. Additionally arrangements will be made for him to have home health physical therapy, occupational therapy and speech therapy as he does also have some dysphagia confirmed on  modified barium swallow. These are all felt to be related to the initial dissection. He is tolerating a heart healthy diet but it is noted that he is to continue on nectar thick liquids. He did develop an infiltrate in the right middle lobe region and has been treated for a presumed pneumonia with Levaquin. His pulmonary symptoms are improved and his chest x-ray is stable. He does have some small pleural effusions but  from a volume viewpoint he is felt to be at his baseline and does not require diuresis at this time. He did develop some hyponatremia but this is showing steady improvement. He has been started on oral Coumadin for the mechanical aortic valve. Additionally he did have postoperative atrial fibrillation but has subsequently been chemically cardioverted to sinus rhythm with amiodarone. His right groin incision does have a small area of superficial dehiscence and we have been treating that with wet-to-dry saline packing. There is no findings consistent with cellulitis and the drainage is clear, not purulent. A nurse will be arranged for dressing changes as well as PT/INR blood draws. As he does not have a current primary care physician or cardiologist we will follow his INR initially at the [facility] office for adjustments of his Coumadin. Currently his status is felt to be stable for discharge on today's date. He did complain of blurred vision postoperatively, which to some degree preceded his surgery, but which was worsened postoperatively. He was seen by Opthamology, who found no acute issues. An MR of the brain revealed a questionable small occipital infarct.    Basename 05/02/12 0522  NA 129*  K 3.6  CL 92*  CO2 30  GLUCOSE 121*  BUN 13  CALCIUM 7.7*    Basename 05/02/12 0522  WBC 19.8*  HGB 9.6*  HCT 28.4*  PLT 237    Basename 05/03/12 0451 05/02/12 0522  INR 1.94* 2.05*     Discharge Instructions:  The patient is discharged to home with extensive instructions on wound care and progressive ambulation.  They are instructed not to drive or perform any heavy lifting until returning to see the physician in his office.  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 Anxiety        [place], Troop Ray  Home Medication Instructions [id number]   Printed on:05/03/12 1237  Medication Information                    amiodarone (PACERONE) 400 MG tablet Take 0.5 tablets (200 mg total) by mouth 2 (two) times daily.           dextromethorphan-guaiFENesin (MUCINEX DM) 30-600 MG per 12 hr tablet Take 1 tablet by mouth 2 (two) times daily as needed.           food thickener (THICK IT) POWD For nectar thick liquids           levofloxacin (LEVAQUIN) 500 MG tablet Take 1 tablet (500 mg total) by mouth daily.           menthol-cetylpyridinium (CEPACOL) 3 MG lozenge Take 1 lozenge (3 mg total) by mouth as needed.           metoprolol tartrate (LOPRESSOR) 25 MG tablet Take 1 tablet (25 mg total) by mouth 2 (two) times daily.  pantoprazole (PROTONIX) 40 MG tablet Take 1 tablet (40 mg total) by mouth daily.           traMADol (ULTRAM) 50 MG tablet Take 1-2 tablets (50-100 mg total) by mouth every 6 (six) hours as needed.           warfarin (COUMADIN) 5 MG tablet Take 1 tablet (5 mg total) by mouth daily at 6 PM.            Follow-up Information    Follow up with Jon Slot, MD. (The office will contact you for appointment in 3 weeks. Please obtain a chest x-ray at [facility] imaging 1 hour to appointment to see surgeon. [facility] is located in the same office complex.)    Contact information:   [address] [place] [address] [phone]       Follow up with Trish Fountain, MD. (Please call to arrange appointment with ophthalmologist in next 1-2 weeks to have your incision reevaluated.)    Contact information:   [address] [place] [address] [phone]         Disposition: For discharge home with home health assistance  Patient's  condition is Good  Gershon Crane, PA-C 05/03/2012  12:37 PM

## 2012-05-03 NOTE — Progress Notes (Signed)
CARE MANAGEMENT NOTE 05/03/2012  Patient:  Jon [name], Jon [name]   Account Number:  [id number]  Date Initiated:  05/03/2012  Documentation initiated by:  Vance Peper  Subjective/Objective Assessment:   46 yr old male s/p aortic dissection.     Action/Plan:   CM spoke with patient this AM. Also spoke with wife via telephone. Choice offered .   Anticipated DC Date:  05/03/2012   Anticipated DC Plan:  HOME W HOME HEALTH SERVICES      DC Planning Services  CM consult      PAC Choice  DURABLE MEDICAL EQUIPMENT  HOME HEALTH   Choice offered to / List presented to:  C-3 Spouse   DME arranged  [facility] - Lavone Nian      DME agency  [facility]     [facility] arranged  HH-1 RN  HH-2 PT  HH-5 SPEECH THERAPY      HH agency  [facility]   Status of service:  Completed, signed off Medicare Important Message given?   (If response is "NO", the following Medicare IM given date fields will be blank) Date Medicare IM given:   Date Additional Medicare IM given:    Discharge Disposition:  HOME W HOME HEALTH SERVICES  Per UR Regulation:    If discussed at Long Length of Stay Meetings, dates discussed:    Comments:

## 2012-05-03 NOTE — Progress Notes (Signed)
CARDIAC REHAB PHASE I   PRE:  Rate/Rhythm: 59 SB    BP: sitting 100/70    SaO2: 98 RA  MODE:  Ambulation: 450 ft   POST:  Rate/Rhythm: 72 SR    BP: sitting 140/70     SaO2: 97 RA  Pt used RW, x2 assist. Verbal cues to control speed, stay inside RW. Pt impulsive. Seemed SOB but did not want to rest. Forced to rest x1. VSS. Tolerated fair. Will need supervision from wife at home. Will need HHPT, OT, RN and RW. Ed completed with wife, daughter and pt. Voiced understanding. Interested in [facility] and will send his name to [facility].  [phone]  Harriet Masson CES, ACSM

## 2012-05-04 ENCOUNTER — Encounter (HOSPITAL_COMMUNITY): Payer: Self-pay | Admitting: Thoracic Surgery (Cardiothoracic Vascular Surgery)

## 2012-05-05 ENCOUNTER — Encounter (HOSPITAL_COMMUNITY): Payer: Self-pay | Admitting: *Deleted

## 2012-05-05 ENCOUNTER — Inpatient Hospital Stay (HOSPITAL_COMMUNITY)
Admission: AD | Admit: 2012-05-05 | Discharge: 2012-05-09 | DRG: 064 | Payer: Medicaid Other | Source: Other Acute Inpatient Hospital | Attending: Internal Medicine | Admitting: Internal Medicine

## 2012-05-05 ENCOUNTER — Telehealth: Payer: Self-pay | Admitting: *Deleted

## 2012-05-05 DIAGNOSIS — I6340 Cerebral infarction due to embolism of unspecified cerebral artery: Principal | ICD-10-CM | POA: Diagnosis present

## 2012-05-05 DIAGNOSIS — F411 Generalized anxiety disorder: Secondary | ICD-10-CM | POA: Diagnosis present

## 2012-05-05 DIAGNOSIS — I359 Nonrheumatic aortic valve disorder, unspecified: Secondary | ICD-10-CM

## 2012-05-05 DIAGNOSIS — Z79899 Other long term (current) drug therapy: Secondary | ICD-10-CM

## 2012-05-05 DIAGNOSIS — I350 Nonrheumatic aortic (valve) stenosis: Secondary | ICD-10-CM | POA: Diagnosis present

## 2012-05-05 DIAGNOSIS — I639 Cerebral infarction, unspecified: Secondary | ICD-10-CM | POA: Diagnosis present

## 2012-05-05 DIAGNOSIS — Z954 Presence of other heart-valve replacement: Secondary | ICD-10-CM

## 2012-05-05 DIAGNOSIS — R1310 Dysphagia, unspecified: Secondary | ICD-10-CM | POA: Diagnosis present

## 2012-05-05 DIAGNOSIS — F17200 Nicotine dependence, unspecified, uncomplicated: Secondary | ICD-10-CM | POA: Diagnosis present

## 2012-05-05 DIAGNOSIS — R55 Syncope and collapse: Secondary | ICD-10-CM | POA: Diagnosis present

## 2012-05-05 DIAGNOSIS — Z7901 Long term (current) use of anticoagulants: Secondary | ICD-10-CM

## 2012-05-05 DIAGNOSIS — J189 Pneumonia, unspecified organism: Secondary | ICD-10-CM | POA: Diagnosis present

## 2012-05-05 DIAGNOSIS — I7100 Dissection of unspecified site of aorta: Secondary | ICD-10-CM

## 2012-05-05 DIAGNOSIS — I712 Thoracic aortic aneurysm, without rupture: Secondary | ICD-10-CM | POA: Diagnosis present

## 2012-05-05 DIAGNOSIS — R5381 Other malaise: Secondary | ICD-10-CM | POA: Diagnosis present

## 2012-05-05 DIAGNOSIS — J90 Pleural effusion, not elsewhere classified: Secondary | ICD-10-CM | POA: Diagnosis present

## 2012-05-05 DIAGNOSIS — I4891 Unspecified atrial fibrillation: Secondary | ICD-10-CM | POA: Diagnosis present

## 2012-05-05 DIAGNOSIS — F419 Anxiety disorder, unspecified: Secondary | ICD-10-CM

## 2012-05-05 HISTORY — DX: Cerebral infarction, unspecified: I63.9

## 2012-05-05 HISTORY — DX: Shortness of breath: R06.02

## 2012-05-05 HISTORY — DX: Thoracic aortic aneurysm, without rupture: I71.2

## 2012-05-05 HISTORY — DX: Cardiac arrhythmia, unspecified: I49.9

## 2012-05-05 HISTORY — DX: Thoracic aortic aneurysm, without rupture, unspecified: I71.20

## 2012-05-05 NOTE — H&P (Signed)
[facility] ADMISSION NOTE  Hospital Admission Note Date: 05/05/2012  Patient name:  Jon [name]  Medical record number:  [id number] Date of birth:  1965/11/21  Age: 46 y.o. Gender:  male PCP:    Renae Fickle, MD  Medical Service: Internal Medicine    Chief Complaint: syncope  History of Present Illness: Patient is a 46 y.o. male with a PMHx of aortic stenosis since childhood, recent Type A dissection requiring emergent repair who was recently d/c from the hospital on 12/28, post op course complicated by aspiration pneumonia and visual changes(occipital infarct per MRI) was transferred from [facility] to [facility] for an episode of syncope. According to the patient's family patient was apparently doing well until morning when suddenly he stopped responding and his eyes rolled upwards. Patient did not respond for about a minute. After repeated shaking patient started responding again. Patient broke out into a cold sweat at that time per patient's wife. No post ictal confusion was noted.   She also has lower extremity edema. Has been taking his antibiotics that is Levaquin since discharge.  Patient denies any fever, chest pain, abdominal pain, change in urinary habits, change in bowel habits, change in appetite.   Allergies: Dilaudid and Penicillins  Past Medical History: Past Medical History  Diagnosis Date  . Anxiety   . Bicuspid aortic valve   . Aortic stenosis   . Shortness of breath   . Dysrhythmia     Past Surgical History: Past Surgical History  Procedure Date  . Cardiac catheterization   . Rotator cuff repair     12 years ago, Left  . Thoracic aortic aneurysm repair 04/22/2012    Procedure: THORACIC ASCENDING ANEURYSM REPAIR (AAA);  Surgeon: [name], MD;  Location: [facility] OR;  Service: Open Heart Surgery;  Laterality: N/A;  . Coronary artery bypass graft 04/22/2012    Procedure: CORONARY ARTERY BYPASS GRAFTING (CABG);  Surgeon: [name], MD;  Location: [facility] OR;  Service: Open Heart Surgery;  Laterality: N/A;  times one with right greater saphenous vein graft  . Intraoperative transesophageal echocardiogram 04/22/2012    Procedure: INTRAOPERATIVE TRANSESOPHAGEAL ECHOCARDIOGRAM;  Surgeon: [name], MD;  Location: [facility] OR;  Service: Open Heart Surgery;  Laterality: N/A;    Family History: Family History  Problem Relation Age of Onset  . Lung disease Mother   . Diabetes Father   . Peripheral Artery Disease Father   . Hypertension Father     Social History: History   Social History  . Marital Status: Married    Spouse Name: N/A    Number of Children: N/A  . Years of Education: N/A   Occupational History  . Not on file.   Social History Main Topics  . Smoking status: Current Every Day Smoker -- 2.5 packs/day    Types: Cigarettes  . Smokeless tobacco: Not on file  . Alcohol Use: Yes     Comment: last drink 1.5 years ago, heavy drinker prior  . Drug Use: No     Comment: previous crack cocaine "quit 4 years ago"  . Sexually Active: Yes   Other Topics Concern  . Not on file   Social History Narrative  . No narrative on file    Review of Systems: Pertinent items are noted in HPI.  Vital Signs: T: 97.4 P: 58 BP: 117/77 RR: 18 O2 sat: 87 % on RA   Physical Exam: General: Vital signs reviewed and noted. Well-developed, well-nourished, in no  acute distress; alert, appropriate and cooperative throughout examination.  Head: Normocephalic, atraumatic.  Eyes: PERRL, EOMI, No signs of anemia or jaundince.  Nose: Mucous membranes moist, not inflammed, nonerythematous.  Throat: Oropharynx nonerythematous, no exudate appreciated.   Neck: No deformities, masses, or tenderness noted.Supple, No carotid Bruits, no JVD.  Lungs:  Normal respiratory effort. Patient has course breath sounds throughout bilateral lung fields, no crackles or wheezes.  Heart: RRR. S1 and S2 normal without gallop, murmur, or  rubs. Chest scar noted midline which has healed well  Abdomen:  BS normoactive. Soft, Nondistended, non-tender.  No masses or organomegaly. Well healed port sites noted with sutures in place  Extremities: 1+ pretibial edema.  Neurologic: A&O X3, CN II - XII are grossly intact. Motor strength is 5/5 in the all 4 extremities, Sensations intact to light touch, Cerebellar signs negative.  Skin: No visible rashes, scars.   Lab results: CBC:    Component Value Date/Time   WBC 19.8* 05/02/2012 0522   HGB 9.6* 05/02/2012 0522   HCT 28.4* 05/02/2012 0522   PLT 237 05/02/2012 0522   MCV 94.0 05/02/2012 0522   NEUTROABS 9.3* 04/22/2012 0142   LYMPHSABS 1.1 04/22/2012 0142   MONOABS 0.6 04/22/2012 0142   EOSABS 0.0 04/22/2012 0142   BASOSABS 0.0 04/22/2012 0142      Comprehensive Metabolic Panel:    Component Value Date/Time   NA 129* 05/02/2012 0522   K 3.6 05/02/2012 0522   CL 92* 05/02/2012 0522   CO2 30 05/02/2012 0522   BUN 13 05/02/2012 0522   CREATININE 0.89 05/02/2012 0522   GLUCOSE 121* 05/02/2012 0522   CALCIUM 7.7* 05/02/2012 0522   AST 779* 04/26/2012 0439   ALT 1250* 04/26/2012 0439   ALKPHOS 144* 04/26/2012 0439   BILITOT 1.4* 04/26/2012 0439   PROT 5.3* 04/26/2012 0439   ALBUMIN 3.0* 04/26/2012 0439     Imaging results: None so far   Assessment & Plan: 46 year old man with aortic stenosis since childhood, recent Type A aortic dissection s/p repair on 04/22/12 was transferred from [facility] to [facility] for syncope on the morning of his admission.   Syncope: Given the findings concerning for occipital infarct in patient ' s recent MRI post surgery and the description of the episode witnessed by patient's family member, seizure is high on differential. Observe on telemetry bed Neuro consult in AM. EEG Check CBC, CMP and other electrolytes  Probable HCAP: He complains of continuous cough. patient is afebrile. He was discharged on nectar thick diet as he  was aspirating. His CXR is concerning for increasing infiltrates 12/28. He was already on levaquin for PNA. Would also add linezolid for now for HCAP. As he is allergic to penicillin patient cannot be put on Zosyn. -Blood cultures -2 view CXR  Type A Aortic dissection s/p repair on 04/22/12: Because of mechanical aortic valve , patient was started on coumadin.   Atrial fibrillation: On amiodarone and coumadin  Leucocytosis: Noted at the time of discharge. CXR is concerning for increasing infiltrates. Recheck CBC  Elevated LFT's:  -check hepatitis panel -consider US abdomen Recheck CMET  DVT PPX: coumadin   Disposition: Likely  Family communication: Plan of care was discussed with the patient and the family present at bedside.  Code status : Full     Lars Mage, M.D.  10:55 PM, 05/05/2012

## 2012-05-05 NOTE — Significant Event (Signed)
I was called by Dr.Snyder at the [facility] emergency room for this 46 year male who sustained a recent type A dissection and was treated at [facility] for this. This patient presented to [facility] ED with a vasovagal episode. He underwent recent heart surgery and CVA ensued. He was treated by CBT S. Dr. Laneta Simmers for this and develop an aspiration pneumonia as he was on nectar thick liquids  VS the emergency room showed temperature 97.3, pulse rate of 46, respirations of 18, blood pressure 112/67 with O2 sats 92-96 %  Patient had a white count of 21,000, hemoglobin 9.9, basic metabolic panel was within normal limits, INR 2.1. Chest x-ray showed infiltrates, CT of the head showed no acute number  Patient was alert and oriented at bedside per Dr. Ilsa Iha his report  He is requesting transfer over as hospitalists overrun often of feel comfortable managing this patient 2 weeks post operatively. Patient has been accepted in transfer given this initial presentation to a telemetry bed.  Pleas Koch, MD [facility] [phone]  .

## 2012-05-05 NOTE — Telephone Encounter (Signed)
I had called Jon [name] earlier today to see if the [facility] had been  to his house to draw his INR. There had been no answer and I had left a message.  Then I received a call from his wife that he had been taken to [facility]  Having had either a stroke or a seizure.  She said she would call me back later.

## 2012-05-06 ENCOUNTER — Inpatient Hospital Stay (HOSPITAL_COMMUNITY): Payer: Medicaid Other

## 2012-05-06 ENCOUNTER — Encounter (HOSPITAL_COMMUNITY): Payer: Self-pay | Admitting: *Deleted

## 2012-05-06 DIAGNOSIS — I712 Thoracic aortic aneurysm, without rupture, unspecified: Secondary | ICD-10-CM

## 2012-05-06 DIAGNOSIS — I719 Aortic aneurysm of unspecified site, without rupture: Secondary | ICD-10-CM

## 2012-05-06 DIAGNOSIS — I350 Nonrheumatic aortic (valve) stenosis: Secondary | ICD-10-CM | POA: Diagnosis present

## 2012-05-06 DIAGNOSIS — J189 Pneumonia, unspecified organism: Secondary | ICD-10-CM

## 2012-05-06 DIAGNOSIS — I359 Nonrheumatic aortic valve disorder, unspecified: Secondary | ICD-10-CM

## 2012-05-06 DIAGNOSIS — I7100 Dissection of unspecified site of aorta: Secondary | ICD-10-CM

## 2012-05-06 DIAGNOSIS — F419 Anxiety disorder, unspecified: Secondary | ICD-10-CM

## 2012-05-06 DIAGNOSIS — R55 Syncope and collapse: Secondary | ICD-10-CM

## 2012-05-06 HISTORY — DX: Thoracic aortic aneurysm, without rupture: I71.2

## 2012-05-06 HISTORY — DX: Thoracic aortic aneurysm, without rupture, unspecified: I71.20

## 2012-05-06 HISTORY — DX: Dissection of unspecified site of aorta: I71.00

## 2012-05-06 HISTORY — DX: Nonrheumatic aortic valve disorder, unspecified: I35.9

## 2012-05-06 HISTORY — DX: Aortic aneurysm of unspecified site, without rupture: I71.9

## 2012-05-06 HISTORY — DX: Pneumonia, unspecified organism: J18.9

## 2012-05-06 HISTORY — DX: Syncope and collapse: R55

## 2012-05-06 LAB — CK TOTAL AND CKMB (NOT AT ARMC)
CK, MB: 3.8 ng/mL (ref 0.3–4.0)
Relative Index: INVALID (ref 0.0–2.5)
Total CK: 63 U/L (ref 7–232)

## 2012-05-06 LAB — COMPREHENSIVE METABOLIC PANEL
ALT: 248 U/L — ABNORMAL HIGH (ref 0–53)
AST: 36 U/L (ref 0–37)
BUN: 12 mg/dL (ref 6–23)
Calcium: 7.8 mg/dL — ABNORMAL LOW (ref 8.4–10.5)
GFR calc non Af Amer: >90 mL/min (ref >90)
Glucose, Bld: 111 mg/dL — ABNORMAL HIGH (ref 70–99)

## 2012-05-06 LAB — CBC WITH DIFFERENTIAL/PLATELET
Eosinophils Relative: 1 % (ref 0–5)
HCT: 26.2 % — ABNORMAL LOW (ref 39.0–52.0)
Lymphocytes Relative: 4 % — ABNORMAL LOW (ref 12–46)
Lymphs Abs: 0.7 10*3/uL (ref 0.7–4.0)
MCV: 95.6 fL (ref 78.0–100.0)
Monocytes Absolute: 1.5 10*3/uL — ABNORMAL HIGH (ref 0.1–1.0)
RDW: 19.8 % — ABNORMAL HIGH (ref 11.5–15.5)
WBC: 15.1 10*3/uL — ABNORMAL HIGH (ref 4.0–10.5)

## 2012-05-06 LAB — EXPECTORATED SPUTUM ASSESSMENT W GRAM STAIN, RFLX TO RESP C

## 2012-05-06 MED ORDER — SODIUM CHLORIDE 0.9 % IJ SOLN
3.0000 mL | INTRAMUSCULAR | Status: DC | PRN
  Administered 2012-05-08: 3 mL via INTRAVENOUS

## 2012-05-06 MED ORDER — ACETAMINOPHEN 325 MG PO TABS: 650.0000 mg | ORAL_TABLET | Freq: Four times a day (QID) | ORAL | Status: DC | PRN

## 2012-05-06 MED ORDER — WARFARIN - PHARMACIST DOSING INPATIENT: Freq: Every day | Status: DC

## 2012-05-06 MED ORDER — KETOROLAC TROMETHAMINE 15 MG/ML IJ SOLN
15.0000 mg | Freq: Once | INTRAMUSCULAR | Status: AC
  Administered 2012-05-06: 15 mg via INTRAVENOUS
  Filled 2012-05-06: qty 1

## 2012-05-06 MED ORDER — KETOROLAC TROMETHAMINE 30 MG/ML IJ SOLN
INTRAMUSCULAR | Status: AC
  Filled 2012-05-06: qty 1

## 2012-05-06 MED ORDER — FOOD THICKENER (THICKENUP CLEAR): 1.0000 | Freq: Every day | ORAL | Status: DC

## 2012-05-06 MED ORDER — WARFARIN SODIUM 5 MG PO TABS
5.0000 mg | ORAL_TABLET | Freq: Once | ORAL | Status: AC
  Administered 2012-05-06: 5 mg via ORAL
  Filled 2012-05-06: qty 1

## 2012-05-06 MED ORDER — PANTOPRAZOLE SODIUM 40 MG PO TBEC
40.0000 mg | DELAYED_RELEASE_TABLET | Freq: Every day | ORAL | Status: DC
  Administered 2012-05-06 – 2012-05-09 (×4): 40 mg via ORAL
  Filled 2012-05-06 (×4): qty 1

## 2012-05-06 MED ORDER — LEVOFLOXACIN IN D5W 750 MG/150ML IV SOLN
750.0000 mg | INTRAVENOUS | Status: DC
  Administered 2012-05-07: 750 mg via INTRAVENOUS
  Filled 2012-05-06 (×2): qty 150

## 2012-05-06 MED ORDER — ACETAMINOPHEN 650 MG RE SUPP: 650.0000 mg | Freq: Four times a day (QID) | RECTAL | Status: DC | PRN

## 2012-05-06 MED ORDER — SODIUM CHLORIDE 0.9 % IJ SOLN
3.0000 mL | Freq: Two times a day (BID) | INTRAMUSCULAR | Status: DC
  Administered 2012-05-06 – 2012-05-07 (×2): 3 mL via INTRAVENOUS

## 2012-05-06 MED ORDER — LEVOFLOXACIN IN D5W 500 MG/100ML IV SOLN
500.0000 mg | INTRAVENOUS | Status: DC
  Administered 2012-05-06: 500 mg via INTRAVENOUS
  Filled 2012-05-06: qty 100

## 2012-05-06 MED ORDER — GADOBENATE DIMEGLUMINE 529 MG/ML IV SOLN
17.0000 mL | Freq: Once | INTRAVENOUS | Status: AC
  Administered 2012-05-06: 17 mL via INTRAVENOUS

## 2012-05-06 MED ORDER — SODIUM CHLORIDE 0.9 % IV SOLN: 250.0000 mL | INTRAVENOUS | Status: DC | PRN

## 2012-05-06 MED ORDER — RESOURCE THICKENUP CLEAR PO POWD
ORAL | Status: DC | PRN
  Filled 2012-05-06: qty 125

## 2012-05-06 MED ORDER — DOCUSATE SODIUM 100 MG PO CAPS
100.0000 mg | ORAL_CAPSULE | Freq: Two times a day (BID) | ORAL | Status: DC
  Administered 2012-05-06 – 2012-05-09 (×7): 100 mg via ORAL
  Filled 2012-05-06 (×8): qty 1

## 2012-05-06 MED ORDER — VANCOMYCIN HCL IN DEXTROSE 1-5 GM/200ML-% IV SOLN
1000.0000 mg | Freq: Three times a day (TID) | INTRAVENOUS | Status: DC
  Administered 2012-05-06 – 2012-05-07 (×5): 1000 mg via INTRAVENOUS
  Filled 2012-05-06 (×7): qty 200

## 2012-05-06 MED ORDER — AMIODARONE HCL 200 MG PO TABS
200.0000 mg | ORAL_TABLET | Freq: Two times a day (BID) | ORAL | Status: DC
  Administered 2012-05-06 – 2012-05-09 (×7): 200 mg via ORAL
  Filled 2012-05-06 (×8): qty 1

## 2012-05-06 MED ORDER — METOPROLOL TARTRATE 25 MG PO TABS
25.0000 mg | ORAL_TABLET | Freq: Two times a day (BID) | ORAL | Status: DC
  Administered 2012-05-06 (×2): 25 mg via ORAL
  Filled 2012-05-06 (×4): qty 1

## 2012-05-06 NOTE — Progress Notes (Signed)
Utilization review completed.  

## 2012-05-06 NOTE — Progress Notes (Addendum)
INITIAL NUTRITION ASSESSMENT  DOCUMENTATION CODES Per approved criteria  -Not Applicable   INTERVENTION:  Dysphagia 3, nectar thick liquid diet -- RD spoke with SLP No further intervention warranted at this time, RD to follow  NUTRITION DIAGNOSIS: No nutrition diagnosis at this time  Goal: Oral intake with meals to meet >/= 90% of estimated nutrition needs  Monitor:  PO intake, weight, labs, I/O's  Reason for Assessment: Malnutrition Screening Tool Report  46 y.o. male  Admitting Dx: Syncope  ASSESSMENT: Patient with recent Type A dissection requiring emergent repair who was recently d/c from the hospital on 12/28, post op course complicated by aspiration pneumonia and visual changes (occipital infarct per MRI); was transferred from [facility] to [facility] for an episode of syncope.   Patient known to this RD from previous hospitalization; PO intake good at 90-100% per flowsheet records; RD spoke with SLP regarding diet order ---> upon re-admissioon was entered as Regular, pudding thick liquids; SLP recommending Dysphagia 3, nectar liquids at this time -- will clarify order.  Height: Ht Readings from Last 1 Encounters:  05/05/12 5\' 11"  (1.803 m)    Weight: Wt Readings from Last 1 Encounters:  05/06/12 183 lb 10.3 oz (83.3 kg)    Ideal Body Weight: 70 kg  % Ideal Body Weight: 118%  Wt Readings from Last 10 Encounters:  05/06/12 183 lb 10.3 oz (83.3 kg)  05/03/12 183 lb 10.3 oz (83.3 kg)  05/03/12 183 lb 10.3 oz (83.3 kg)    Usual Body Weight: unknown  % Usual Body Weight: ---  BMI:  Body mass index is 25.61 kg/(m^2).  Estimated Nutritional Needs: Kcal: 2100-2300 Protein: 115-125 gm Fluid: 2.1-2.3 L  Skin: Intact  Diet Order: General  EDUCATION NEEDS: -No education needs identified at this time   Intake/Output Summary (Last 24 hours) at 05/06/12 1236 Last data filed at 05/06/12 2130  Gross per 24 hour  Intake    120 ml  Output    500 ml  Net    -380 ml    Labs:   Lab 05/06/12 0550 05/02/12 0522  NA 134* 129*  K 4.4 3.6  CL 99 92*  CO2 28 30  BUN 12 13  CREATININE 0.82 0.89  CALCIUM 7.8* 7.7*  MG 2.0 --  PHOS 4.0 --  GLUCOSE 111* 121*    Scheduled Meds:   . amiodarone  200 mg Oral BID  . docusate sodium  100 mg Oral BID  . levofloxacin (LEVAQUIN) IV  750 mg Intravenous Q24H  . metoprolol tartrate  25 mg Oral BID  . pantoprazole  40 mg Oral Daily  . sodium chloride  3 mL Intravenous Q12H  . vancomycin  1,000 mg Intravenous Q8H    Continuous Infusions:   Past Medical History  Diagnosis Date  . Anxiety   . Bicuspid aortic valve   . Aortic stenosis   . Shortness of breath   . Dysrhythmia     Past Surgical History  Procedure Date  . Cardiac catheterization   . Rotator cuff repair     12 years ago, Left  . Thoracic aortic aneurysm repair 04/22/2012    Procedure: THORACIC ASCENDING ANEURYSM REPAIR (AAA);  Surgeon: [name], MD;  Location: [facility] OR;  Service: Open Heart Surgery;  Laterality: N/A;  . Coronary artery bypass graft 04/22/2012    Procedure: CORONARY ARTERY BYPASS GRAFTING (CABG);  Surgeon: [name], MD;  Location: [facility] OR;  Service: Open Heart Surgery;  Laterality: N/A;  times one with right greater saphenous vein graft  . Intraoperative transesophageal echocardiogram 04/22/2012    Procedure: INTRAOPERATIVE TRANSESOPHAGEAL ECHOCARDIOGRAM;  Surgeon: [name], MD;  Location: [facility] OR;  Service: Open Heart Surgery;  Laterality: N/A;    Kirkland Hun, RD, LDN Pager #: [phone] After-Hours Pager #: [phone]

## 2012-05-06 NOTE — Consult Note (Signed)
NEURO HOSPITALIST CONSULT NOTE    Reason for Consult:seizure versus syncope  HPI:                                                                                                                                          Jon [name] is an 46 y.o. male PMHx of aortic stenosis since childhood, mechanical AO heart valve, on chronic coumadin with recent Type A dissection requiring emergent repair.  Patient was D/C from the hospital on 12/28. On 12/30 patient walked into his room to change his bandage. As he was getting his pants off he felt nauseated and unwell. His wife left the room to get something he could vomit in and when she returned she found him supine in the bed, right arm behind his head, non responsive, eyes rolled back and diaphoretic. She tried to arouse him and only could get partial response. After about 2-3 minutes he came around.  HE does not recall the period of time he was unresponsive. There was NO shaking or jerking notes, NO incontinence. After he was aroused he proceeded to vomit. Patient is currently AOx3 , HE is complaining about having difficulty focusing on objects. He states his vision has been off since surgery. He was seen by opthalmology while hospitalized on 12/26.  Their exam was essentially normal with possibility of refraction.  Patient states his vision will "come and go" and is most note ably compromised when changing focus from a far object to a near object (and vise versa).  Past Medical History  Diagnosis Date  . Anxiety   . Bicuspid aortic valve   . Aortic stenosis   . Shortness of breath   . Dysrhythmia     Past Surgical History  Procedure Date  . Cardiac catheterization   . Rotator cuff repair     12 years ago, Left  . Thoracic aortic aneurysm repair 04/22/2012    Procedure: THORACIC ASCENDING ANEURYSM REPAIR (AAA);  Surgeon: [name], MD;  Location: [facility] OR;  Service: Open Heart Surgery;  Laterality: N/A;  .  Coronary artery bypass graft 04/22/2012    Procedure: CORONARY ARTERY BYPASS GRAFTING (CABG);  Surgeon: [name], MD;  Location: [facility] OR;  Service: Open Heart Surgery;  Laterality: N/A;  times one with right greater saphenous vein graft  . Intraoperative transesophageal echocardiogram 04/22/2012    Procedure: INTRAOPERATIVE TRANSESOPHAGEAL ECHOCARDIOGRAM;  Surgeon: [name], MD;  Location: [facility] OR;  Service: Open Heart Surgery;  Laterality: N/A;    Family History  Problem Relation Age of Onset  . Lung disease Mother   . Diabetes Father   . Peripheral Artery Disease Father   . Hypertension Father      Social History:  reports that he has been smoking  Cigarettes.  He has been smoking about 2.5 packs per day. He does not have any smokeless tobacco history on file. He reports that he drinks alcohol. He reports that he does not use illicit drugs.  Allergies  Allergen Reactions  . Dilaudid (Hydromorphone Hcl) Other (See Comments)    High doses cause itching  . Penicillins Other (See Comments)    Unknown    MEDICATIONS:                                                                                                                     Prior to Admission:  Prescriptions prior to admission  Medication Sig Dispense Refill  . amiodarone (PACERONE) 400 MG tablet Take 0.5 tablets (200 mg total) by mouth 2 (two) times daily.  60 tablet  1  . dextromethorphan-guaiFENesin (MUCINEX DM) 30-600 MG per 12 hr tablet Take 1 tablet by mouth 2 (two) times daily as needed. congestion      . food thickener (RESOURCE THICKENUP CLEAR) POWD Take 1 Can by mouth daily.      Marland Kitchen levofloxacin (LEVAQUIN) 500 MG tablet Take 1 tablet (500 mg total) by mouth daily.  4 tablet  0  . metoprolol tartrate (LOPRESSOR) 25 MG tablet Take 1 tablet (25 mg total) by mouth 2 (two) times daily.  60 tablet  1  . pantoprazole (PROTONIX) 40 MG tablet Take 1 tablet (40 mg total) by mouth daily.  30 tablet  1  .  traMADol (ULTRAM) 50 MG tablet Take 50-100 mg by mouth every 6 (six) hours as needed. For pain      . warfarin (COUMADIN) 5 MG tablet Take 1 tablet (5 mg total) by mouth daily at 6 PM.  100 tablet  1  . [DISCONTINUED] dextromethorphan-guaiFENesin (MUCINEX DM) 30-600 MG per 12 hr tablet Take 1 tablet by mouth 2 (two) times daily as needed.  30 tablet  0  . [DISCONTINUED] traMADol (ULTRAM) 50 MG tablet Take 1-2 tablets (50-100 mg total) by mouth every 6 (six) hours as needed.  50 tablet  0   Scheduled:   . amiodarone  200 mg Oral BID  . docusate sodium  100 mg Oral BID  . levofloxacin (LEVAQUIN) IV  750 mg Intravenous Q24H  . metoprolol tartrate  25 mg Oral BID  . pantoprazole  40 mg Oral Daily  . sodium chloride  3 mL Intravenous Q12H  . vancomycin  1,000 mg Intravenous Q8H     ROS:  History obtained from the patient  General ROS: negative for - chills, fatigue, fever, night sweats, weight gain or weight loss Psychological ROS: negative for - behavioral disorder, hallucinations, memory difficulties, mood swings or suicidal ideation Ophthalmic ROS: positive for - blurry vision,  ENT ROS: negative for - epistaxis, nasal discharge, oral lesions, sore throat, tinnitus or vertigo Allergy and Immunology ROS: negative for - hives or itchy/watery eyes Hematological and Lymphatic ROS: negative for - bleeding problems, bruising or swollen lymph nodes Endocrine ROS: negative for - galactorrhea, hair pattern changes, polydipsia/polyuria or temperature intolerance Respiratory ROS: positive for - shortness of breath  Cardiovascular ROS: negative for - chest pain, dyspnea on exertion, edema or irregular heartbeat Gastrointestinal ROS: negative for - abdominal pain, diarrhea, hematemesis, nausea/vomiting or stool incontinence Genito-Urinary ROS: negative for - dysuria,  hematuria, incontinence or urinary frequency/urgency Musculoskeletal ROS: positive for - LE swelling Neurological ROS: as noted in HPI Dermatological ROS: negative for rash and skin lesion changes   Blood pressure 113/71, pulse 63, temperature 97.6 F (36.4 C), temperature source Oral, resp. rate 20, height 5\' 11"  (1.803 m), weight 83.3 kg (183 lb 10.3 oz), SpO2 94.00%.   Neurologic Examination:                                                                                                      Mental Status: Alert, oriented, thought content appropriate.  Speech shows pressured speech and slight dysarthria without evidence of aphasia.  Able to follow 3 step commands without difficulty.  Cranial Nerves: II: Discs flat bilaterally; Visual fields shows decreased ability to count fingers in left peripheral visual fields but no significant field cut. Endorses unusual vision(outline only) Pupils equal, round, reactive to light and accommodation III,IV, VI: ptosis not present, extra-ocular motions intact bilaterally V,VII: smile symmetric, facial light touch sensation normal bilaterally VIII: hearing normal bilaterally IX,X: gag reflex present XI: bilateral shoulder shrug XII: midline tongue extension Motor: Right : Upper extremity   5/5    Left:     Upper extremity   5/5  Lower extremity   5/5     Lower extremity   5/5 Tone and bulk:normal tone throughout; no atrophy noted Sensory: Pinprick and light touch intact throughout, bilaterally Deep Tendon Reflexes: 2+ and symmetric throughout Plantars: Right: downgoing   Left: downgoing Cerebellar: normal finger-to-nose,  normal heel-to-shin test CV: pulses palpable throughout     No results found for this basename: cbc, bmp, coags, chol, tri, ldl, hga1c    Results for orders placed during the hospital encounter of 05/05/12 (from the past 48 hour(s))  MRSA PCR SCREENING     Status: Normal   Collection Time   05/05/12 11:11 PM       Component Value Range Comment   MRSA by PCR NEGATIVE  NEGATIVE   CULTURE, EXPECTORATED SPUTUM-ASSESSMENT     Status: Normal   Collection Time   05/06/12  1:37 AM      Component Value Range Comment   Specimen Description SPUTUM      Special Requests Normal  Sputum evaluation        Value: THIS SPECIMEN IS ACCEPTABLE. RESPIRATORY CULTURE REPORT TO FOLLOW.   Report Status 05/06/2012 FINAL     CULTURE, RESPIRATORY     Status: Normal (Preliminary result)   Collection Time   05/06/12  1:37 AM      Component Value Range Comment   Specimen Description SPUTUM      Special Requests NONE      Gram Stain        Value: MODERATE WBC PRESENT,BOTH PMN AND MONONUCLEAR     MODERATE SQUAMOUS EPITHELIAL CELLS PRESENT     FEW GRAM POSITIVE COCCI IN CLUSTERS     IN PAIRS RARE GRAM NEGATIVE RODS     RARE GRAM POSITIVE RODS   Culture PENDING      Report Status PENDING     COMPREHENSIVE METABOLIC PANEL     Status: Abnormal   Collection Time   05/06/12  5:50 AM      Component Value Range Comment   Sodium 134 (*) 135 - 145 mEq/L    Potassium 4.4  3.5 - 5.1 mEq/L    Chloride 99  96 - 112 mEq/L    CO2 28  19 - 32 mEq/L    Glucose, Bld 111 (*) 70 - 99 mg/dL    BUN 12  6 - 23 mg/dL    Creatinine, Ser 4.09  0.50 - 1.35 mg/dL    Calcium 7.8 (*) 8.4 - 10.5 mg/dL    Total Protein 5.0 (*) 6.0 - 8.3 g/dL    Albumin 2.1 (*) 3.5 - 5.2 g/dL    AST 36  0 - 37 U/L    ALT 248 (*) 0 - 53 U/L    Alkaline Phosphatase 188 (*) 39 - 117 U/L    Total Bilirubin 0.7  0.3 - 1.2 mg/dL    GFR calc non Af Amer >90  >90 mL/min    GFR calc Af Amer >90  >90 mL/min   MAGNESIUM     Status: Normal   Collection Time   05/06/12  5:50 AM      Component Value Range Comment   Magnesium 2.0  1.5 - 2.5 mg/dL   PHOSPHORUS     Status: Normal   Collection Time   05/06/12  5:50 AM      Component Value Range Comment   Phosphorus 4.0  2.3 - 4.6 mg/dL   CBC WITH DIFFERENTIAL     Status: Abnormal   Collection Time   05/06/12  5:50  AM      Component Value Range Comment   WBC 15.1 (*) 4.0 - 10.5 K/uL    RBC 2.74 (*) 4.22 - 5.81 MIL/uL    Hemoglobin 8.5 (*) 13.0 - 17.0 g/dL    HCT 81.1 (*) 91.4 - 52.0 %    MCV 95.6  78.0 - 100.0 fL    MCH 31.0  26.0 - 34.0 pg    MCHC 32.4  30.0 - 36.0 g/dL    RDW 78.2 (*) 95.6 - 15.5 %    Platelets 270  150 - 400 K/uL    Neutrophils Relative 84 (*) 43 - 77 %    Neutro Abs 12.7 (*) 1.7 - 7.7 K/uL    Lymphocytes Relative 4 (*) 12 - 46 %    Lymphs Abs 0.7  0.7 - 4.0 K/uL    Monocytes Relative 10  3 - 12 %    Monocytes Absolute 1.5 (*) 0.1 - 1.0 K/uL  Eosinophils Relative 1  0 - 5 %    Eosinophils Absolute 0.2  0.0 - 0.7 K/uL    Basophils Relative 0  0 - 1 %    Basophils Absolute 0.0  0.0 - 0.1 K/uL   PROTIME-INR     Status: Abnormal   Collection Time   05/06/12  5:50 AM      Component Value Range Comment   Prothrombin Time 21.7 (*) 11.6 - 15.2 seconds    INR 1.98 (*) 0.00 - 1.49     X-ray Chest Pa And Lateral   05/06/2012  *RADIOLOGY REPORT*  Clinical Data: Shortness of breath, fever.  CHEST - 2 VIEW  Comparison: 05/05/2012  Findings: On the improving vascular congestion and interstitial edema pattern.  Bilateral pleural effusions are stable.  Bibasilar atelectasis slightly improved.  Stable cardiomegaly.  Prior valve replacement.  IMPRESSION: Improving interstitial edema pattern and vascular congestion. Continued bibasilar atelectasis and small effusions.   Original Report Authenticated By: Charlett Nose, M.D.    EEG: Clinical Interpretation: This borderline EEG could be suggestive of non-specific bilateral occipital lobe dysfunction, however the fact that the finding was only present during drowsiness does call into question the significance of this finding and this could represent a normal variant. There was no seizure or seizure predisposition recorded on this study.    Assessment/Plan:  46 YO male presenting to hospital after brief period of unconciousness lasting for  approximately 1-2 minutes.  Episode was accompanied by vomiting and diaphoresis. Patient and wife does not describe a post ictal period. EEG showed no epileptiform activity however does show quasirhythmic slow activity in the occipital lobes bilterally.  Given borderline EEG and episodic visual complaints, would like to further evaluate occipital lobes with a MRI brain W contrast and thin cuts of occipital lobes.  In addition, will obtain a EEG tomorrow to compare recent EEG as patient is complaining of a on-off phenomenon with his vision.   Recommend: 1) EEG again tomorrow 2) MRI brain W contrast and thin cuts of occipital lobes.    Felicie Morn PA-C [facility] [phone]  05/06/2012, 12:58 PM     I have seen and evaluated the patient. I have reviewed the above note and made appropriate changes. The visual symptoms are unusual, and I would like to repeat the MRI to assess if that punctate area could have been artifactual. No clear ictal activity on EEG today, but there is some posterior delta activity suggestive of dysfunction.   Ritta Slot, MD [facility] [phone]  If 7pm- 7am, please page neurology on call at [phone].

## 2012-05-06 NOTE — Progress Notes (Signed)
ANTICOAGULATION CONSULT NOTE - Follow Up Consult  Pharmacy Consult for Coumadin Indication: recent AVR/ post-op afib  Allergies  Allergen Reactions  . Dilaudid (Hydromorphone Hcl) Other (See Comments)    High doses cause itching  . Penicillins Other (See Comments)    Unknown    Patient Measurements: Height: 5\' 11"  (180.3 cm) Weight: 183 lb 10.3 oz (83.3 kg) IBW/kg (Calculated) : 75.3   Vital Signs: Temp: 97.6 F (36.4 C) (12/31 0455) Temp src: Oral (12/31 0455) BP: 113/71 mmHg (12/31 0455) Pulse Rate: 63  (12/31 0455)  Labs:  Basename 05/06/12 0550  HGB 8.5*  HCT 26.2*  PLT 270  APTT --  LABPROT 21.7*  INR 1.98*  HEPARINUNFRC --  CREATININE 0.82  CKTOTAL --  CKMB --  TROPONINI --    Estimated Creatinine Clearance: 119.9 ml/min (by C-G formula based on Cr of 0.82).  Assessment: 46yom continues on coumadin for recent AVR/post-op afib. INR yesterday at [facility] was therapeutic at 2.1. Patient reports taking a dose of coumadin yesterday morning before going to [place]. INR today is just slightly below goal at 1.98. He is on amiodarone 200mg  bid as pta but levaquin added for possible HCAP so will need to watch INR closely.  CXR shows right pleural effusion - noted plan for probable US guided thoracentesis. No orders to hold coumadin at this point - will follow up plan and schedule a dose for tonight.  Goal of Therapy:  INR 2-3 Monitor platelets by anticoagulation protocol: Yes   Plan:  1) Coumadin 5mg  x 1 tonight 2) Follow up INR in AM 3) Follow up plans for thoracentesis  Fredrik Rigger 05/06/2012,2:01 PM

## 2012-05-06 NOTE — Progress Notes (Signed)
SLP Cancellation Note  Patient Details Name: Jon [name] MRN: [id number] DOB: December 28, 1965   Cancelled treatment:       Reason Eval/Treat Not Completed: Patient at procedure or test/unavailable. Pt with transport. Will attempt again this pm. If not available, will complete swallow assessment tomorrow.   Harlon Ditty, MA CCC-SLP [phone]  [name] 05/06/2012, 2:12 PM

## 2012-05-06 NOTE — Progress Notes (Signed)
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 -MRI brain and repeat EEG as indicated by neurology -Cardiology consultation--appreciate Dr. 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  CHEST - 2 VIEW  Comparison: 05/05/2012  Findings: On the improving vascular congestion and interstitial edema pattern.  Bilateral pleural effusions are stable.  Bibasilar atelectasis slightly improved.  Stable cardiomegaly.  Prior valve replacement.  IMPRESSION: Improving interstitial edema pattern and vascular congestion. Continued bibasilar atelectasis and  small effusions.   Original Report Authenticated By: Charlett Nose, M.D.    Dg Chest 2 View  05/03/2012  *RADIOLOGY REPORT*  Clinical Data: Chest pain, cough, congestion.  CHEST - 2 VIEW  Comparison: 04/28/2012  Findings: Right lower lobe airspace opacity again noted with decreasing lung volumes.  This could represent atelectasis or worsening infiltrate.  Increasing left base density, likely atelectasis.  Mild cardiomegaly.  Small bilateral pleural effusions.  IMPRESSION: Increasing bibasilar atelectasis or infiltrates.  Decreasing lung volumes.  Small bilateral effusions.   Original Report Authenticated By: Charlett Nose, M.D.    Dg Chest 2 View  04/28/2012  *RADIOLOGY REPORT*  Clinical Data: Post aortic valve replacement, shortness of breath  CHEST - 2 VIEW  Comparison: Portable chest x-ray of 04/27/2012  Findings: Aeration has improved slightly.  There is still a parenchymal opacity in the right mid lung as well as a small right effusion with a smaller left effusion present as well. Cardiomegaly is stable.  No pneumothorax is seen.  IMPRESSION: Slightly better aeration.  Persistent opacity in the right mid lung with small effusions right greater than left.   Original Report Authenticated By: Dwyane Dee, M.D.    Ct Head Wo Contrast  04/25/2012  *RADIOLOGY REPORT*  Clinical Data: Acute frontal headache.  Aortic dissection.  Postop dissection repair  CT HEAD WITHOUT CONTRAST  Technique:  Contiguous axial images were obtained from the base of the skull through the vertex without contrast.  Comparison: CT head 07/07/2003  Findings: Ventricle size is normal.  No acute infarct.  Negative for hemorrhage or mass.  No edema or midline shift.  Calvarium is intact.  Focal area of skin thickening right parietal scalp is unchanged from 2005.  IMPRESSION: No acute intracranial abnormality.   Original Report Authenticated By: Janeece Riggers, M.D.    Mr Brain Wo Contrast  05/02/2012  *RADIOLOGY REPORT*  Clinical  Data:  Blurred vision.  Previous aortic dissection. Surgical repair.  MRI HEAD WITHOUT CONTRAST  Technique:  Multiplanar, multiecho pulse sequences of the brain and surrounding structures were obtained according to standard protocol without intravenous contrast.  Comparison: Head CT 04/25/2012  Findings: The brainstem and cerebellum are normal.  The cerebral hemispheres are normal except for a questionable 3 mm infarction affecting the surface of the right occipital lobe.  This appearance could be due to artifact.  No mass lesion, acute hemorrhage, hydrocephalus or extra-axial collection.  There is a punctate focus of hemosiderin in the right frontal white matter, not likely significant.  No pituitary mass.  No inflammatory sinus disease. Major vessels are patent into the brain.  IMPRESSION: The study is normal, with the exception of a questionable punctate acute/subacute infarction along the surface of the right occipital lobe inferiorly.  This could possibly explain the patient's symptoms.  However, this is an area and appearance that could be artifactual related to artifact frequently seen along the surface of the brain adjacent to dense bone.  Certainly, there is no large, definite or widespread pathology.   Original Report Authenticated By: Paulina Fusi, M.D.    Mr Laqueta Jean Wo Contrast  05/06/2012  *RADIOLOGY REPORT*  Clinical Data: Recent vascular surgery.  Visual disturbance.  MRI HEAD WITHOUT AND WITH CONTRAST  Technique:  Multiplanar, multiecho pulse sequences of the brain and surrounding structures were obtained according to standard protocol without and with intravenous contrast  Contrast: 17mL MULTIHANCE GADOBENATE DIMEGLUMINE 529 MG/ML IV SOLN  Comparison: Head CT 05/05/2012.  Findings:  Today's study shows a punctate acute infarction within the left cerebellum. Punctate focus of restricted diffusion questioned on the previous study in the right occipital lobe is less evident.  This either represents  evolution of a punctate infarction or suggests that the previous finding was artifactual. Elsewhere within the occipital regions, there are two or three punctate foci that are borderline for punctate infarctions.  In any case, the examination suggests that there may be some ongoing micro emboli within the posterior circulation.  No anterior circulation pathology is seen.  No mass lesion, hemorrhage, hydrocephalus or extra-axial collection.  There are  IMPRESSION: Definite new punctate infarction within the left cerebellum. Question a few other punctate infarctions in the occipital lobes, though the findings are at the borderline of detectability.  The findings raise the possibility of ongoing posterior circulation micro emboli.   Original Report Authenticated By: Paulina Fusi, M.D.    Dg Chest Port 1 View  04/27/2012  *RADIOLOGY REPORT*  Clinical Data: Preop  PORTABLE CHEST - 1 VIEW  Comparison: 04/25/2012  Findings: Cardiomegaly again noted.  Status post median sternotomy. Persistent hazy atelectasis or infiltrate right midlung.  No diagnostic pneumothorax.  IMPRESSION: Cardiomegaly.  Status post median sternotomy.  Persistent hazy atelectasis or infiltrates right midlung.   Original Report Authenticated By: Natasha Mead, M.D.    Dg Chest Port 1 View  04/25/2012  *RADIOLOGY REPORT*  Clinical Data: Right-sided atelectasis.  Thoracic aneurysm repair.  PORTABLE CHEST - 1 VIEW  Comparison: 04/24/2012.  Findings: Trachea is midline.  Heart is enlarged, stable.  Right IJ catheter sheath remains in place, as do bilateral chest tubes and/or mediastinal drains and epicardial pacer wires.  Sternotomy wires are stable in position.  Improving right midlung zone airspace opacification.  Lungs are otherwise clear.  No pneumothorax. There may be a tiny right pleural effusion.  IMPRESSION:  1.  Improving, but still unresolved, right midlung zone airspace  opacification. 2.  No pneumothorax. 3.  Probable tiny right pleural  effusion.   Original Report Authenticated By: Leanna Battles, M.D.    Dg Chest Portable 1 View In Am  04/24/2012  *RADIOLOGY REPORT*  Clinical Data: Postop cardiac surgery for thoracic aortic aneurysm repair.  PORTABLE CHEST - 1 VIEW  Comparison: Radiographs 04/23/2012 and 04/22/2012.  CT 04/21/2012.  Findings: 0653 hours.  Interval extubation with removal of the nasogastric tube and Swan-Ganz catheter.  Right IJ sheath and chest tubes remain in place.  Heart size and mediastinal contours are stable.  There is overall improving aeration of the right upper lobe which demonstrated partial collapse previously.  Residual focal perihilar airspace disease remains on the right.  The left lung is clear.  There is no pneumothorax.  IMPRESSION:  1.  Interval partial re-expansion of the right upper lobe.  There is persistent right perihilar opacity which was not present preoperatively and may reflect atelectasis or pneumonia. 2.  No pneumothorax.   Original Report Authenticated By: Carey Bullocks, M.D.    Dg Chest Portable 1 View In Am  04/23/2012  *RADIOLOGY REPORT*  Clinical Data: Thoracic aortic aneurysm repair.  PORTABLE CHEST - 1 VIEW  Comparison: 04/22/2012  Findings: Previously described support apparatus is stable and remains well positioned.  Volume loss from right upper lobe atelectasis is unchanged.  There is mild right lung base opacity also likely atelectasis with an associated small effusion.  The lungs are otherwise clear.  No pneumothorax.  No mediastinal widening.  IMPRESSION: No change from the previous day's study.  Support apparatus is stable well-positioned.   Original Report Authenticated By: Amie Portland, M.D.    Dg Chest Portable 1 View  04/22/2012  *RADIOLOGY REPORT*  Clinical Data: Status post CABG.  PORTABLE CHEST - 1 VIEW  Comparison: Single view of the chest 04/21/2012.  Findings: Endotracheal tube is in place with the tip in good position, well above the carina.  Right IJ approach  Swan-Ganz catheter tip is in the proximal most right main pulmonary artery. Chest tubes are noted. There is a small right pleural effusion.  No pneumothorax is seen.  Atelectasis right midlung noted.  IMPRESSION: Support apparatus in good position.  No pneumothorax.  Small right effusion and scattered atelectasis.   Original Report Authenticated By: Holley Dexter, M.D.    Dg Swallowing Func-speech Pathology  05/01/2012  Carolan Shiver, CCC-SLP     05/01/2012  2:24 PM Objective Swallowing Evaluation: Modified Barium Swallowing Study   Patient Details  Name: Jon [name] MRN: [id number] Date of Birth: Oct 08, 1965  Today's Date: 05/01/2012 Time: 1210-1240 SLP Time Calculation (min): 30 min  Past Medical History:  Past Medical History  Diagnosis Date  . Anxiety    Past Surgical History:  Past Surgical History  Procedure Date  . Cardiac catheterization   . Rotator cuff repair     12 years ago, Left  . Thoracic aortic aneurysm repair 04/22/2012    Procedure: THORACIC ASCENDING ANEURYSM REPAIR (AAA);  Surgeon:  [name], MD;  Location: [facility] OR;  Service: Open Heart  Surgery;  Laterality: N/A;  . Coronary artery bypass graft 04/22/2012    Procedure: CORONARY ARTERY BYPASS GRAFTING (CABG);  Surgeon:  [name], MD;  Location: [facility] OR;  Service: Open Heart  Surgery;  Laterality: N/A;  times one with right greater  saphenous vein graft  . Intraoperative transesophageal echocardiogram 04/22/2012    Procedure: INTRAOPERATIVE TRANSESOPHAGEAL ECHOCARDIOGRAM;   Surgeon: [name],  MD;  Location: [facility] OR;  Service:  Open Heart Surgery;  Laterality: N/A;   HPI:  46 y/o male with history of aortic stenosis since childhood who  developed sudden onset of severe CP.  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  Patient  transferred to [facility] for Thoracic ascending aneurysm repair, CABG  and Intraopertive Transesophageal Echocardiogram.  CXR indicates  Right lung  infliltrate . Pt has been followed by SLP to address  dysphagia s/p surgery.  Pt has not been tolerating thin liquids,  with consistent s/s aspiration.  Plan was for FEES today, but due  to instrument malfunction, MBS was ordered as an alternative.        Assessment / Plan / Recommendation Clinical Impression  Dysphagia Diagnosis: Mild pharyngeal phase dysphagia Clinical impression: Pt presents with a pharyngeal dysphagia,  isolated to inadequate laryngeal closure during the pharyngeal  phase.  This impairment leads to consistent aspiration of thin  liquids, accompanied by a nonprotective cough response.  Postural  adjustments (head turn to left and right) increased aspiration; a  chin tuck resulted in liquids penetrating the larynx but not  descending below the vocal cords.  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  After lengthy  discussion with pt and spouse, rec: nectar-thick liquids OR thin  liquids with use of a chin-tuck; meds whole with puree; SLP f/u  for swallowing therapy after D/C from hospital. Spouse is in  agreement; pt with impaired insight into circumstances.  SLP will  follow here until D/C.    Treatment Recommendation  Therapy as outlined in treatment plan below    Diet Recommendation Regular;Nectar-thick liquid (and thin with  chin tuck)   Liquid Administration via: Cup Medication Administration: Whole meds with puree Supervision: Patient able to self feed;Intermittent supervision  to cue for compensatory strategies Compensations: Slow rate;Small sips/bites Postural Changes and/or Swallow Maneuvers: Chin tuck    Other  Recommendations Oral Care Recommendations: Oral care BID   Follow Up Recommendations       Frequency and Duration min 2x/week  2 weeks   Pertinent Vitals/Pain none    SLP Swallow Goals Patient will consume recommended diet without observed clinical  signs of aspiration with: Supervision/safety Swallow Study Goal #1 - Progress: Progressing toward goal  Patient will utilize recommended strategies during swallow to  increase swallowing safety with: Supervision/safety Swallow Study Goal #2 - Progress: Progressing toward goal   General Date of Onset: 04/22/12 HPI: 46 y/o male with history of aortic stenosis since childhood  who developed sudden onset of severe CP.  Patient went to  [facility] ED where CT angiogram showed a Type A  dissection.   Patient transferred to [facility] for Thoracic ascending aneurysm repair,  CABG and Intraopertive Transesophageal Echocardiogram.  CXR  indicates Right lung infliltrate . Pt has been followed by SLP to  address dysphagia s/p surgery.  Pt has not been tolerating thin  liquids, with consistent s/s aspiration.  Plan was for FEES  today, but due to insturment malfunction, MBS was ordered as an  alternative.    Type of Study: Modified Barium Swallowing Study Reason for Referral: Objectively evaluate swallowing function Diet Prior to this Study: Regular;Nectar-thick liquids Temperature Spikes Noted: No Respiratory Status: Room air History of Recent Intubation: Yes Behavior/Cognition: Alert;Impulsive;Decreased sustained attention Oral Cavity - Dentition: Adequate natural dentition Oral Motor / Sensory Function: Within functional limits Self-Feeding Abilities: Able to feed self Patient Positioning: Upright  in chair Baseline Vocal Quality: Hoarse;Low vocal intensity Volitional Cough: Weak Volitional Swallow: Able to elicit Anatomy: Within functional limits Pharyngeal Secretions: Not observed secondary MBS    Reason for Referral Objectively evaluate swallowing function   Oral Phase Oral Preparation/Oral Phase Oral Phase: WFL   Pharyngeal Phase Pharyngeal Phase Pharyngeal Phase: Impaired Pharyngeal - Thin Pharyngeal - Thin Cup: Reduced airway/laryngeal  closure;Penetration/Aspiration during swallow;Moderate  aspiration;Compensatory strategies attempted (Comment) (head turn  left and right; mendelsohn maneuver, chin tuck) Penetration/Aspiration  details (thin cup): Material enters  airway, passes BELOW cords and not ejected out despite cough  attempt by patient  Cervical Esophageal Phase    GO   Jon [name], Jon [name] Pager [phone]  Cervical Esophageal Phase Cervical Esophageal Phase: Jon [name]         Jon [name] 05/01/2012, 2:22 PM           Subjective: Patient denies fevers, chills, chest pain, worsening shortness of breath, nausea, vomiting, diarrhea, dysarthria, bowel or bladder incontinence, dysuria, abdominal pain. He has some dyspnea on exertion. He has a nonproductive cough. No hemoptysis.   Objective: Filed Vitals:   05/05/12 2131 05/05/12 2136 05/06/12 0455  BP: 117/77  113/71  Pulse: 58  63  Temp: 97.4 F (36.3 C)  97.6 F (36.4 C)  TempSrc: Oral  Oral  Resp: 18  20  Height: 5\' 11"  (1.803 m)    Weight: 83.2 kg (183 lb 6.8 oz)  83.3 kg (183 lb 10.3 oz)  SpO2: 87% 91% 94%    Intake/Output Summary (Last 24 hours) at 05/06/12 1928 Last data filed at 05/06/12 1240  Gross per 24 hour  Intake    240 ml  Output    500 ml  Net   -260 ml   Weight change:  Exam:   General:  Pt is alert, follows commands appropriately, not in acute distress  HEENT: No icterus, No thrush, No neck mass, [place]/AT  Cardiovascular: RRR, S1/S2, no rubs, no gallops  Respiratory: Bibasilar crackles. No wheezes rhonchi. Good air movement.   Abdomen: Soft/+BS, non tender, non distended, no guarding  Extremities: trace edema, No lymphangitis, No petechiae, No rashes, no synovitis; right thigh wound without any lymphangitis, necrosis, crepitance, drainage.  Data Reviewed: Basic Metabolic Panel:  Lab 05/06/12 4540 05/02/12 0522  NA 134* 129*  K 4.4 3.6  CL 99 92*  CO2 28 30  GLUCOSE 111* 121*  BUN 12 13  CREATININE 0.82 0.89  CALCIUM 7.8* 7.7*  MG 2.0 --  PHOS 4.0 --   Liver Function Tests:  Lab 05/06/12 0550  AST 36  ALT 248*  ALKPHOS 188*  BILITOT 0.7  PROT 5.0*  ALBUMIN 2.1*   No results found for  this basename: LIPASE:5,AMYLASE:5 in the last 168 hours No results found for this basename: AMMONIA:5 in the last 168 hours CBC:  Lab 05/06/12 0550 05/02/12 0522  WBC 15.1* 19.8*  NEUTROABS 12.7* --  HGB 8.5* 9.6*  HCT 26.2* 28.4*  MCV 95.6 94.0  PLT 270 237   Cardiac Enzymes: No results found for this basename: CKTOTAL:5,CKMB:5,CKMBINDEX:5,TROPONINI:5 in the last 168 hours BNP: No components found with this basename: POCBNP:5 CBG:  Lab 04/29/12 2202  GLUCAP 93    Recent Results (from the past 240 hour(s))  MRSA PCR SCREENING     Status: Normal   Collection Time   05/05/12 11:11 PM      Component Value Range Status Comment   MRSA by PCR NEGATIVE  NEGATIVE Final  CULTURE, EXPECTORATED SPUTUM-ASSESSMENT     Status: Normal   Collection Time   05/06/12  1:37 AM      Component Value Range Status Comment   Specimen Description SPUTUM   Final    Special Requests Normal   Final    Sputum evaluation     Final    Value: THIS SPECIMEN IS ACCEPTABLE. RESPIRATORY CULTURE REPORT TO FOLLOW.   Report Status 05/06/2012 FINAL   Final   CULTURE, RESPIRATORY     Status: Normal (Preliminary result)   Collection Time   05/06/12  1:37 AM      Component Value Range Status Comment   Specimen Description SPUTUM   Final    Special Requests NONE   Final    Gram Stain     Final    Value: MODERATE WBC PRESENT,BOTH PMN AND MONONUCLEAR     MODERATE SQUAMOUS EPITHELIAL CELLS PRESENT     FEW GRAM POSITIVE COCCI IN CLUSTERS     IN PAIRS RARE GRAM NEGATIVE RODS     RARE GRAM POSITIVE RODS   Culture PENDING   Incomplete    Report Status PENDING   Incomplete      Scheduled Meds:   . amiodarone  200 mg Oral BID  . docusate sodium  100 mg Oral BID  . levofloxacin (LEVAQUIN) IV  750 mg Intravenous Q24H  . metoprolol tartrate  25 mg Oral BID  . pantoprazole  40 mg Oral Daily  . sodium chloride  3 mL Intravenous Q12H  . vancomycin  1,000 mg Intravenous Q8H  . Warfarin - Pharmacist Dosing  Inpatient   Does not apply q1800   Continuous Infusions:    Jon Rumberger, DO  [facility] Pager [phone]  If 7PM-7AM, please contact night-coverage [email or web address] Password [email or web address] 05/06/2012, 7:28 PM   LOS: 1 day

## 2012-05-06 NOTE — Progress Notes (Addendum)
Patient ID: Jon [name] MRN: [id number] DOB/AGE: 1965-08-18 45 y.o.  Admit date: 05/05/2012 Referring Physician:  Primary Physician:  Primary Cardiologist:  Reason for Consultation:   HPI:  46 yo male with history of aortic stenosis/bicuspid aortic valve admitted 04/22/12 with type 1 aortic dissection requiring emergent surgical correction with replacement of the aortic root, placement of a mechanical aortic valve and single vessel CABG with SVG to RCA. His dissection extended to the left internal carotid artery. His hospital course was complicated by aspiration pneumonia and visual changes felt to be due to small occipital CVA. He also had post-op atrial fibrillation but converted to NSR on amiodarone therapy before discharge. He was discharged home on 05/03/12 and then readmitted on 05/05/12 after a syncopal episode at home, LOC for 1-2 minutes. He denies any chest pain or palpitations around the event. He notes feeling nauseous and vomiting and then he was told by his wife that he had passed out. He does not recall passing out. EEG today without any conclusive findings. Neurology consult pending. Echo 04/28/12 with mild LV systolic dsyfunction with LVEF 40-45%. EKG with NSR, 1st degree AV block, RBBB, LAFB, lateral TWI, poor R wave progression through precordial leads. No carotid artery dopplers have been performed.   He tells me that his vision is still blurry but otherwise he feels well. No chest pain or SOB at this time.    Past Medical History  Diagnosis Date  . Anxiety   . Bicuspid aortic valve   . Aortic stenosis     mechanical AVR 04/22/12  . Shortness of breath   . Dysrhythmia   . Thoracic aortic aneurysm     replacement aortic root 04/22/12  . CVA (cerebral infarction)     occipital CVA 12/13    Family History  Problem Relation Age of Onset  . Lung disease Mother   . Diabetes Father   . Peripheral Artery Disease Father   . Hypertension Father     History    Social History  . Marital Status: Married    Spouse Name: N/A    Number of Children: N/A  . Years of Education: N/A   Occupational History  . Not on file.   Social History Main Topics  . Smoking status: Current Every Day Smoker -- 2.5 packs/day    Types: Cigarettes  . Smokeless tobacco: Not on file  . Alcohol Use: Yes     Comment: last drink 1.5 years ago, heavy drinker prior  . Drug Use: No     Comment: previous crack cocaine "quit 4 years ago"  . Sexually Active: Yes   Other Topics Concern  . Not on file   Social History Narrative  . No narrative on file    Past Surgical History  Procedure Date  . Cardiac catheterization   . Rotator cuff repair     12 years ago, Left  . Thoracic aortic aneurysm repair 04/22/2012    Procedure: THORACIC ASCENDING ANEURYSM REPAIR (AAA);  Surgeon: [name], MD;  Location: [facility] OR;  Service: Open Heart Surgery;  Laterality: N/A;  . Coronary artery bypass graft 04/22/2012    Procedure: CORONARY ARTERY BYPASS GRAFTING (CABG);  Surgeon: [name], MD;  Location: [facility] OR;  Service: Open Heart Surgery;  Laterality: N/A;  times one with right greater saphenous vein graft  . Intraoperative transesophageal echocardiogram 04/22/2012    Procedure: INTRAOPERATIVE TRANSESOPHAGEAL ECHOCARDIOGRAM;  Surgeon: [name], MD;  Location: [facility] OR;  Service:  Open Heart Surgery;  Laterality: N/A;    Allergies  Allergen Reactions  . Dilaudid (Hydromorphone Hcl) Other (See Comments)    High doses cause itching  . Penicillins Other (See Comments)    Unknown    Current Meds:     . amiodarone  200 mg Oral BID  . docusate sodium  100 mg Oral BID  . ketorolac  15 mg Intravenous Once  . ketorolac      . levofloxacin (LEVAQUIN) IV  750 mg Intravenous Q24H  . metoprolol tartrate  25 mg Oral BID  . pantoprazole  40 mg Oral Daily  . sodium chloride  3 mL Intravenous Q12H  . vancomycin  1,000 mg Intravenous Q8H  . warfarin  5 mg  Oral ONCE-1800  . Warfarin - Pharmacist Dosing Inpatient   Does not apply q1800    Review of systems complete and found to be negative unless listed above    Physical Exam: Blood pressure 113/71, pulse 63, temperature 97.6 F (36.4 C), temperature source Oral, resp. rate 20, height 5\' 11"  (1.803 m), weight 183 lb 10.3 oz (83.3 kg), SpO2 94.00%.    General: Well developed, well nourished, NAD  HEENT: OP clear, mucus membranes moist  SKIN: warm, dry. No rashes.  Neuro: No focal deficits  Musculoskeletal: Muscle strength 5/5 all ext  Psychiatric: Mood and affect normal  Neck: No JVD, no carotid bruits, no thyromegaly, no lymphadenopathy.  Lungs: Coarse BS bilaterally with diffuse rhonci Cardiovascular: Regular rate and rhythm. No murmurs, gallops or rubs.  Abdomen:Soft. Bowel sounds present. Non-tender.  Extremities: 1+ right lower extremity edema. No left lower extremity edema. Pulses are 2 + in the bilateral DP/PT.   Labs: INR:1.98   Lab Results  Component Value Date   WBC 15.1* 05/06/2012   HGB 8.5* 05/06/2012   HCT 26.2* 05/06/2012   MCV 95.6 05/06/2012   PLT 270 05/06/2012    Lab 05/06/12 0550  NA 134*  K 4.4  CL 99  CO2 28  BUN 12  CREATININE 0.82  CALCIUM 7.8*  PROT 5.0*  BILITOT 0.7  ALKPHOS 188*  ALT 248*  AST 36  GLUCOSE 111*    Radiology: Bilateral pleural effusions, pulmonary edema noted to be improving.   EKG: NSR, 1st degree AV block, RBBB, LAFB, lateral TWI, poor R wave progression through precordial leads.  MRI brain:  Definite new punctate infarction within the left cerebellum.  Question a few other punctate infarctions in the occipital lobes,  though the findings are at the borderline of detectability. The  findings raise the possibility of ongoing posterior circulation  micro emboli.   ASSESSMENT AND PLAN:   1. Syncope: Unclear etiology in this patient with recent major thoracic surgery. He did have single vessel bypass with SVG to the  RCA but no chest pain before admission and no injury current on EKG.His aortic root and arch were reconstructed during the surgery and this involved both the innominate artery and Left common carotid artery. Would cycle cardiac markers tonight to exclude myocardial ischemia. Repeat EKG in am.  Monitor on telemetry for another 24 hours. He will likely need an outpatient cardiac event monitor for 21 days. Consider carotid artery dopplers or CTA neck given his aortic arch replacement with potential compromise of flow into the intracranial circulation especially given small infarcts in cerebellum. Pt is in sinus rhythm currently. Cannot exclude intra-cardiac source of emboli given his post-op atrial fibrillation, however, he is anti-coagulated on coumadin. He will need a  repeat echo this week to assess LVEF, exclude valvular disease, assess AVR. May consider TEE later this week if there is no other clear source of embolus.    Signed: Gohan Collister 05/06/2012, 1:47 PM

## 2012-05-06 NOTE — Progress Notes (Signed)
ANTICOAGULATION CONSULT NOTE - Initial Consult  Pharmacy Consult for vancomycin and Coumadin Indication: possible HCAP and recent AVR/ post-op Afib  Allergies  Allergen Reactions  . Dilaudid (Hydromorphone Hcl) Other (See Comments)    High doses cause itching  . Penicillins Other (See Comments)    Unknown    Patient Measurements: Height: 5\' 11"  (180.3 cm) Weight: 183 lb 6.8 oz (83.2 kg) IBW/kg (Calculated) : 75.3    Vital Signs: Temp: 97.4 F (36.3 C) (12/30 2131) Temp src: Oral (12/30 2131) BP: 117/77 mmHg (12/30 2131) Pulse Rate: 58  (12/30 2131)  Labs: (at [facility]) WBC 21.5 Hgb 9.9 Hct 30.6 Plt 293  SCr 0.6  INR 2.1  Basename 05/03/12 0451  HGB --  HCT --  PLT --  APTT --  LABPROT 21.4*  INR 1.94*  HEPARINUNFRC --  CREATININE --  CKTOTAL --  CKMB --  TROPONINI --    Estimated Creatinine Clearance: 110.5 ml/min (by C-G formula based on Cr of 0.89).   Medical History: Past Medical History  Diagnosis Date  . Anxiety   . Bicuspid aortic valve   . Aortic stenosis   . Shortness of breath   . Dysrhythmia     Medications:  Prescriptions prior to admission  Medication Sig Dispense Refill  . amiodarone (PACERONE) 400 MG tablet Take 0.5 tablets (200 mg total) by mouth 2 (two) times daily.  60 tablet  1  . dextromethorphan-guaiFENesin (MUCINEX DM) 30-600 MG per 12 hr tablet Take 1 tablet by mouth 2 (two) times daily as needed. congestion      . food thickener (RESOURCE THICKENUP CLEAR) POWD Take 1 Can by mouth daily.      Marland Kitchen levofloxacin (LEVAQUIN) 500 MG tablet Take 1 tablet (500 mg total) by mouth daily.  4 tablet  0  . metoprolol tartrate (LOPRESSOR) 25 MG tablet Take 1 tablet (25 mg total) by mouth 2 (two) times daily.  60 tablet  1  . pantoprazole (PROTONIX) 40 MG tablet Take 1 tablet (40 mg total) by mouth daily.  30 tablet  1  . traMADol (ULTRAM) 50 MG tablet Take 50-100 mg by mouth every 6 (six) hours as needed. For pain      .  warfarin (COUMADIN) 5 MG tablet Take 1 tablet (5 mg total) by mouth daily at 6 PM.  100 tablet  1  . [DISCONTINUED] dextromethorphan-guaiFENesin (MUCINEX DM) 30-600 MG per 12 hr tablet Take 1 tablet by mouth 2 (two) times daily as needed.  30 tablet  0  . [DISCONTINUED] traMADol (ULTRAM) 50 MG tablet Take 1-2 tablets (50-100 mg total) by mouth every 6 (six) hours as needed.  50 tablet  0    Assessment: 46 yo male s/p repair AAA 12/17, now admitted with possible HCAP, for empiric antibiotics.  Vancomycin 1 g IV given at [facility] at 8 pm last night   Goal of Therapy:  Vancomcyin trough 15-20 INR 2-3 Monitor platelets by anticoagulation protocol: Yes   Plan:  Vancomycin 1 g IV q8h F/u daily INR  Khushbu Pippen, Gary Fleet 05/06/2012,1:06 AM

## 2012-05-06 NOTE — Progress Notes (Signed)
Events noted.  Patient currently out of his room.  Patient had seizure or syncopal event yesterday.  WBC elevated- improved with antibiotics  His CXR shows a right pleural effusion- will need ultrasound guided thoracentesis  Will check back later today.

## 2012-05-06 NOTE — Progress Notes (Addendum)
History: 46 yo M with syncope, concern for seizure.   Sedation: None  Background: There is a well defined posterior dominant rhythm of 12.5 Hz that attenuates with eye opening. He repeatedly becomes drowsy and falls asleep during the test. During drowsiness, there is prominent slow wave morphology theta activity that becomes quasirhythmic in the occipital lobes. This is not present during periods of maximal wakefullness.   Photic stimulation: Physiologic driving is not performed.   EEG Findings 1) Quasirhythmic slow activity in the occipital lobes bialterally, only present during drowsiness.   Clinical Interpretation: This borderline EEG could be suggestive of non-specific bilateral occipital lobe dysfunction, however the fact that the finding was only present during drowsiness does call into question the significance of this finding and this could represent a normal variant. There was no seizure or seizure predisposition recorded on this study.   Ritta Slot, MD [facility] [phone]  If 7pm- 7am, please page neurology on call at [phone].

## 2012-05-06 NOTE — Progress Notes (Signed)
vhest tube sutures removed; pt tolerated well

## 2012-05-06 NOTE — Progress Notes (Signed)
EEG completed as ordered.

## 2012-05-07 ENCOUNTER — Inpatient Hospital Stay (HOSPITAL_COMMUNITY): Payer: Medicaid Other

## 2012-05-07 DIAGNOSIS — I639 Cerebral infarction, unspecified: Secondary | ICD-10-CM

## 2012-05-07 DIAGNOSIS — I359 Nonrheumatic aortic valve disorder, unspecified: Secondary | ICD-10-CM

## 2012-05-07 DIAGNOSIS — Z8673 Personal history of transient ischemic attack (TIA), and cerebral infarction without residual deficits: Secondary | ICD-10-CM

## 2012-05-07 HISTORY — DX: Cerebral infarction, unspecified: I63.9

## 2012-05-07 HISTORY — DX: Personal history of transient ischemic attack (TIA), and cerebral infarction without residual deficits: Z86.73

## 2012-05-07 LAB — BASIC METABOLIC PANEL
CO2: 30 mEq/L (ref 19–32)
Chloride: 102 mEq/L (ref 96–112)
Creatinine, Ser: 0.95 mg/dL (ref 0.50–1.35)
GFR calc Af Amer: >90 mL/min (ref >90)
Sodium: 137 mEq/L (ref 135–145)

## 2012-05-07 LAB — TROPONIN I
Troponin I: <0.3 ng/mL (ref <0.30)
Troponin I: <0.3 ng/mL (ref <0.30)

## 2012-05-07 LAB — TSH: TSH: 4.823 u[IU]/mL — ABNORMAL HIGH (ref 0.350–4.500)

## 2012-05-07 LAB — CBC WITH DIFFERENTIAL/PLATELET
Basophils Absolute: 0 10*3/uL (ref 0.0–0.1)
Basophils Relative: 0 % (ref 0–1)
HCT: 29.7 % — ABNORMAL LOW (ref 39.0–52.0)
Lymphocytes Relative: 7 % — ABNORMAL LOW (ref 12–46)
MCHC: 32.3 g/dL (ref 30.0–36.0)
Monocytes Absolute: 1.8 10*3/uL — ABNORMAL HIGH (ref 0.1–1.0)
Neutro Abs: 11.9 10*3/uL — ABNORMAL HIGH (ref 1.7–7.7)
Neutrophils Relative %: 80 % — ABNORMAL HIGH (ref 43–77)
Platelets: 301 10*3/uL (ref 150–400)
RDW: 19.8 % — ABNORMAL HIGH (ref 11.5–15.5)
WBC: 14.9 10*3/uL — ABNORMAL HIGH (ref 4.0–10.5)

## 2012-05-07 LAB — HEPATITIS PANEL, ACUTE
HCV Ab: REACTIVE — AB
Hep A IgM: NEGATIVE
Hep B C IgM: NEGATIVE
Hepatitis B Surface Ag: NEGATIVE

## 2012-05-07 LAB — PROTIME-INR: Prothrombin Time: 18.6 seconds — ABNORMAL HIGH (ref 11.6–15.2)

## 2012-05-07 MED ORDER — IPRATROPIUM BROMIDE 0.02 % IN SOLN
0.5000 mg | Freq: Four times a day (QID) | RESPIRATORY_TRACT | Status: DC
  Administered 2012-05-07 – 2012-05-08 (×3): 0.5 mg via RESPIRATORY_TRACT
  Filled 2012-05-07 (×4): qty 2.5

## 2012-05-07 MED ORDER — ASPIRIN 81 MG PO CHEW
81.0000 mg | CHEWABLE_TABLET | Freq: Every day | ORAL | Status: DC
  Administered 2012-05-07 – 2012-05-09 (×3): 81 mg via ORAL
  Filled 2012-05-07 (×2): qty 1

## 2012-05-07 MED ORDER — ALBUTEROL SULFATE (5 MG/ML) 0.5% IN NEBU
2.5000 mg | INHALATION_SOLUTION | Freq: Four times a day (QID) | RESPIRATORY_TRACT | Status: DC
  Administered 2012-05-07 – 2012-05-08 (×3): 2.5 mg via RESPIRATORY_TRACT
  Filled 2012-05-07 (×3): qty 0.5

## 2012-05-07 MED ORDER — OXYCODONE-ACETAMINOPHEN 5-325 MG PO TABS
2.0000 | ORAL_TABLET | Freq: Once | ORAL | Status: AC
  Administered 2012-05-07: 2 via ORAL
  Filled 2012-05-07: qty 2

## 2012-05-07 MED ORDER — OXYCODONE-ACETAMINOPHEN 5-325 MG PO TABS
1.0000 | ORAL_TABLET | ORAL | Status: DC | PRN
  Administered 2012-05-08 – 2012-05-09 (×3): 1 via ORAL
  Filled 2012-05-07 (×3): qty 1

## 2012-05-07 MED ORDER — HYDROCOD POLST-CHLORPHEN POLST 10-8 MG/5ML PO LQCR: 5.0000 mL | Freq: Two times a day (BID) | ORAL | Status: DC | PRN

## 2012-05-07 MED ORDER — METOPROLOL TARTRATE 12.5 MG HALF TABLET
12.5000 mg | ORAL_TABLET | Freq: Two times a day (BID) | ORAL | Status: DC
  Administered 2012-05-07: 12.5 mg via ORAL
  Filled 2012-05-07 (×4): qty 1

## 2012-05-07 MED ORDER — WARFARIN SODIUM 7.5 MG PO TABS
7.5000 mg | ORAL_TABLET | Freq: Once | ORAL | Status: AC
  Administered 2012-05-07: 7.5 mg via ORAL
  Filled 2012-05-07: qty 1

## 2012-05-07 NOTE — Progress Notes (Signed)
  Echocardiogram 2D Echocardiogram has been performed.  Jon [name] 05/07/2012, 4:33 PM

## 2012-05-07 NOTE — Progress Notes (Signed)
I was called by the nurse to check the patient because of marked bradycardia.  Review of telemetry overnight shows marked sinus bradycardia down into the 30s while sleeping.  Pulse is now running 53 sinus bradycardia wall patient alert and upright in bed.  Patient is not having any current symptoms related to his bradycardia.  He received metoprolol last night but none today.  We will reduce his dose of metoprolol to 12.5 mg twice a day and continue to observe on telemetry.

## 2012-05-07 NOTE — Progress Notes (Signed)
ANTICOAGULATION CONSULT NOTE - Follow Up Consult  Pharmacy Consult for Coumadin Indication: recent AVR/ post-op afib  Allergies  Allergen Reactions  . Dilaudid (Hydromorphone Hcl) Other (See Comments)    High doses cause itching  . Penicillins Other (See Comments)    Unknown    Patient Measurements: Height: 5\' 11"  (180.3 cm) Weight: 191 lb 9.3 oz (86.9 kg) IBW/kg (Calculated) : 75.3   Vital Signs: Temp: 97.4 F (36.3 C) (01/01 0452) Temp src: Oral (01/01 0452) BP: 84/60 mmHg (01/01 1345) Pulse Rate: 53  (01/01 0452)  Labs:  Alvira Philips 05/07/12 0655 05/07/12 0045 05/07/12 0037 05/06/12 1852 05/06/12 0550  HGB -- 9.6* -- -- 8.5*  HCT -- 29.7* -- -- 26.2*  PLT -- 301 -- -- 270  APTT -- -- -- -- --  LABPROT -- 18.6* -- -- 21.7*  INR -- 1.61* -- -- 1.98*  HEPARINUNFRC -- -- -- -- --  CREATININE -- 0.95 -- -- 0.82  CKTOTAL -- -- -- 63 --  CKMB -- -- -- 3.8 --  TROPONINI <0.30 -- <0.30 0.30* --    Estimated Creatinine Clearance: 103.5 ml/min (by C-G formula based on Cr of 0.95).  Assessment: 46yom continues on coumadin for recent AVR/post-op afib. INR is subtherapeutic and decreased from yesterday on usual home dose of 5mg  daily. He is s/p thoracentesis with 1.8 liters of blood-tinged fluid removed. Continues on amiodarone 200mg  bid as pta and levaquin for possible HCAP - watch INR closely.  Goal of Therapy:  INR 2-3 Monitor platelets by anticoagulation protocol: Yes   Plan:  1) Increase coumadin to 7.5mg  x 1 2) Follow up INR in AM  Fredrik Rigger 05/07/2012,1:57 PM

## 2012-05-07 NOTE — Progress Notes (Signed)
Spoke with Dr.Gerhardt, said ok to proceed with US guided thoracentesis

## 2012-05-07 NOTE — Progress Notes (Signed)
Checked pts BP; R arm 86/60 manually, L arm 110/58 manually; per report pt brady down to 30s while sleeping; currently pt 1st degree HB, had brady down to 30s while awake, pt asymptomatic; Zonia Kief paged and made aware; will continue to monitor

## 2012-05-07 NOTE — Progress Notes (Signed)
[facility] PROGRESS NOTE  Jon [name] ZOX:[id number] DOB: [birth year] DOA: 05/05/2012 PCP: Renae Fickle, MD  HISTORY 47 yo male with history of aortic stenosis/bicuspid aortic valve admitted 04/22/12 with type 1 aortic dissection requiring emergent surgical correction with replacement of the aortic root, placement of a mechanical aortic valve and single vessel CABG with SVG to RCA. His dissection extended to the left internal carotid artery. His hospital course was complicated by aspiration pneumonia and visual changes felt to be due to small occipital CVA. He also had post-op atrial fibrillation but converted to NSR on amiodarone therapy before discharge. He was discharged home on 05/03/12 and then readmitted on 05/05/12 after a syncopal episode. On 12/30 patient walked into his room to change his bandage. As he was getting his pants off he felt nauseated and unwell. His wife left the room to get something he could vomit in and when she returned she found him supine in the bed, right arm behind his head, non responsive, eyes rolled back and diaphoretic. She tried to arouse him and only could get partial response. After about 2-3 minutes he came around. HE does not recall the period of time he was unresponsive. There was NO shaking or jerking notes, NO incontinence. After he was aroused he proceeded to vomit. Patient is currently AOx3 , HE is complaining about having difficulty focusing on objects. He states his vision has been off since surgery. He was seen by opthalmology while hospitalized on 12/26. Their exam was essentially normal with possibility of refraction. Patient states his vision will "come and go" and is most note ably compromised when changing focus from a far object to a near object (and vise versa).  EEG 05/06/30  showed no epileptiform activity however does show quasirhythmic slow activity in the occipital lobes bilterally. Echo 04/28/12 with mild LV systolic dsyfunction with  LVEF 40-45%. EKG with NSR, 1st degree AV block, RBBB, LAFB, lateral TWI, poor R wave progression through precordial leads.    Assessment/Plan:  Syncope  -MRI 05/06/12 showed new punctate infarction of left cerebellum--representing ongoing  Microemboli -ASA added today -EEG--"borderline EEG could be suggestive of non-specific bilateral occipital lobe dysfunction, however the fact that the finding was only present during drowsiness does call into question the significance of this finding and this could represent a normal variant"  -Appreciate neurology input.  -Await repeat EEG as indicated by neurology  -Cardiology consultation--appreciate Recs-->await MRA neck -Cycle troponins-->neg -await repeat echo -May ultimately need event monitor  HCAP/Aspiration/Dysphagia  -Awaiting Speech/swallow evaluation>> continue dysphagia 3 diet for now pending repeat eval -Discontinue Levaquin--patient has been taking since approximately 04/26/2012 -Mildly elevated WBC likely stress response -Monitor off antibiotics--discontinue vancomycin -Sputum culture shows polymicrobial flora Aortic stenosis status post mechanical AVR-04/22/12 -Repeat echocardiogram as per cardiology  -possible contribution to syncope  -Continue warfarin, pharmacy dosing  thoracic aortic aneurysm dissection s/p repair 04/22/2012  -Wound care consult for leg wound  -Currently no chest pain or worsening shortness of breath  Postoperative atrial fibrillation -Continue amiodarone -Metoprolol tartrate decreased due to bradycardia Deconditioning -PT evaluation -Consult CIR    Family Communication:   Pt at beside Disposition Plan:   ?CIR vs SNF vs home      Procedures/Studies: X-ray Chest Pa And Lateral   05/06/2012  *RADIOLOGY REPORT*  Clinical Data: Shortness of breath, fever.  CHEST - 2 VIEW  Comparison: 05/05/2012  Findings: On the improving vascular congestion and interstitial edema pattern.  Bilateral pleural  effusions are stable.  Bibasilar atelectasis  slightly improved.  Stable cardiomegaly.  Prior valve replacement.  IMPRESSION: Improving interstitial edema pattern and vascular congestion. Continued bibasilar atelectasis and small effusions.   Original Report Authenticated By: Charlett Nose, M.D.    Dg Chest 2 View  05/03/2012  *RADIOLOGY REPORT*  Clinical Data: Chest pain, cough, congestion.  CHEST - 2 VIEW  Comparison: 04/28/2012  Findings: Right lower lobe airspace opacity again noted with decreasing lung volumes.  This could represent atelectasis or worsening infiltrate.  Increasing left base density, likely atelectasis.  Mild cardiomegaly.  Small bilateral pleural effusions.  IMPRESSION: Increasing bibasilar atelectasis or infiltrates.  Decreasing lung volumes.  Small bilateral effusions.   Original Report Authenticated By: Charlett Nose, M.D.    Dg Chest 2 View  04/28/2012  *RADIOLOGY REPORT*  Clinical Data: Post aortic valve replacement, shortness of breath  CHEST - 2 VIEW  Comparison: Portable chest x-ray of 04/27/2012  Findings: Aeration has improved slightly.  There is still a parenchymal opacity in the right mid lung as well as a small right effusion with a smaller left effusion present as well. Cardiomegaly is stable.  No pneumothorax is seen.  IMPRESSION: Slightly better aeration.  Persistent opacity in the right mid lung with small effusions right greater than left.   Original Report Authenticated By: Dwyane Dee, M.D.    Ct Head Wo Contrast  04/25/2012  *RADIOLOGY REPORT*  Clinical Data: Acute frontal headache.  Aortic dissection.  Postop dissection repair  CT HEAD WITHOUT CONTRAST  Technique:  Contiguous axial images were obtained from the base of the skull through the vertex without contrast.  Comparison: CT head 07/07/2003  Findings: Ventricle size is normal.  No acute infarct.  Negative for hemorrhage or mass.  No edema or midline shift.  Calvarium is intact.  Focal area of skin thickening  right parietal scalp is unchanged from 2005.  IMPRESSION: No acute intracranial abnormality.   Original Report Authenticated By: Janeece Riggers, M.D.    Mr Brain Wo Contrast  05/02/2012  *RADIOLOGY REPORT*  Clinical Data: Blurred vision.  Previous aortic dissection. Surgical repair.  MRI HEAD WITHOUT CONTRAST  Technique:  Multiplanar, multiecho pulse sequences of the brain and surrounding structures were obtained according to standard protocol without intravenous contrast.  Comparison: Head CT 04/25/2012  Findings: The brainstem and cerebellum are normal.  The cerebral hemispheres are normal except for a questionable 3 mm infarction affecting the surface of the right occipital lobe.  This appearance could be due to artifact.  No mass lesion, acute hemorrhage, hydrocephalus or extra-axial collection.  There is a punctate focus of hemosiderin in the right frontal white matter, not likely significant.  No pituitary mass.  No inflammatory sinus disease. Major vessels are patent into the brain.  IMPRESSION: The study is normal, with the exception of a questionable punctate acute/subacute infarction along the surface of the right occipital lobe inferiorly.  This could possibly explain the patient's symptoms.  However, this is an area and appearance that could be artifactual related to artifact frequently seen along the surface of the brain adjacent to dense bone.  Certainly, there is no large, definite or widespread pathology.   Original Report Authenticated By: Paulina Fusi, M.D.    Mr Laqueta Jean Wo Contrast  05/06/2012  *RADIOLOGY REPORT*  Clinical Data: Recent vascular surgery.  Visual disturbance.  MRI HEAD WITHOUT AND WITH CONTRAST  Technique:  Multiplanar, multiecho pulse sequences of the brain and surrounding structures were obtained according to standard protocol without and with intravenous contrast  Contrast: 17mL MULTIHANCE GADOBENATE DIMEGLUMINE 529 MG/ML IV SOLN  Comparison: Head CT 05/05/2012.  Findings:   Today's study shows a punctate acute infarction within the left cerebellum. Punctate focus of restricted diffusion questioned on the previous study in the right occipital lobe is less evident.  This either represents evolution of a punctate infarction or suggests that the previous finding was artifactual. Elsewhere within the occipital regions, there are two or three punctate foci that are borderline for punctate infarctions.  In any case, the examination suggests that there may be some ongoing micro emboli within the posterior circulation.  No anterior circulation pathology is seen.  No mass lesion, hemorrhage, hydrocephalus or extra-axial collection.  There are  IMPRESSION: Definite new punctate infarction within the left cerebellum. Question a few other punctate infarctions in the occipital lobes, though the findings are at the borderline of detectability.  The findings raise the possibility of ongoing posterior circulation micro emboli.   Original Report Authenticated By: Paulina Fusi, M.D.    Dg Chest Port 1 View  04/27/2012  *RADIOLOGY REPORT*  Clinical Data: Preop  PORTABLE CHEST - 1 VIEW  Comparison: 04/25/2012  Findings: Cardiomegaly again noted.  Status post median sternotomy. Persistent hazy atelectasis or infiltrate right midlung.  No diagnostic pneumothorax.  IMPRESSION: Cardiomegaly.  Status post median sternotomy.  Persistent hazy atelectasis or infiltrates right midlung.   Original Report Authenticated By: Natasha Mead, M.D.    Dg Chest Port 1 View  04/25/2012  *RADIOLOGY REPORT*  Clinical Data: Right-sided atelectasis.  Thoracic aneurysm repair.  PORTABLE CHEST - 1 VIEW  Comparison: 04/24/2012.  Findings: Trachea is midline.  Heart is enlarged, stable.  Right IJ catheter sheath remains in place, as do bilateral chest tubes and/or mediastinal drains and epicardial pacer wires.  Sternotomy wires are stable in position.  Improving right midlung zone airspace opacification.  Lungs are otherwise  clear.  No pneumothorax. There may be a tiny right pleural effusion.  IMPRESSION:  1.  Improving, but still unresolved, right midlung zone airspace opacification. 2.  No pneumothorax. 3.  Probable tiny right pleural effusion.   Original Report Authenticated By: Leanna Battles, M.D.    Dg Chest Portable 1 View In Am  04/24/2012  *RADIOLOGY REPORT*  Clinical Data: Postop cardiac surgery for thoracic aortic aneurysm repair.  PORTABLE CHEST - 1 VIEW  Comparison: Radiographs 04/23/2012 and 04/22/2012.  CT 04/21/2012.  Findings: 0653 hours.  Interval extubation with removal of the nasogastric tube and Swan-Ganz catheter.  Right IJ sheath and chest tubes remain in place.  Heart size and mediastinal contours are stable.  There is overall improving aeration of the right upper lobe which demonstrated partial collapse previously.  Residual focal perihilar airspace disease remains on the right.  The left lung is clear.  There is no pneumothorax.  IMPRESSION:  1.  Interval partial re-expansion of the right upper lobe.  There is persistent right perihilar opacity which was not present preoperatively and may reflect atelectasis or pneumonia. 2.  No pneumothorax.   Original Report Authenticated By: Carey Bullocks, M.D.    Dg Chest Portable 1 View In Am  04/23/2012  *RADIOLOGY REPORT*  Clinical Data: Thoracic aortic aneurysm repair.  PORTABLE CHEST - 1 VIEW  Comparison: 04/22/2012  Findings: Previously described support apparatus is stable and remains well positioned.  Volume loss from right upper lobe atelectasis is unchanged.  There is mild right lung base opacity also likely atelectasis with an associated small effusion.  The lungs are otherwise clear.  No pneumothorax.  No mediastinal widening.  IMPRESSION: No change from the previous day's study.  Support apparatus is stable well-positioned.   Original Report Authenticated By: Amie Portland, M.D.    Dg Chest Portable 1 View  04/22/2012  *RADIOLOGY REPORT*  Clinical  Data: Status post CABG.  PORTABLE CHEST - 1 VIEW  Comparison: Single view of the chest 04/21/2012.  Findings: Endotracheal tube is in place with the tip in good position, well above the carina.  Right IJ approach Swan-Ganz catheter tip is in the proximal most right main pulmonary artery. Chest tubes are noted. There is a small right pleural effusion.  No pneumothorax is seen.  Atelectasis right midlung noted.  IMPRESSION: Support apparatus in good position.  No pneumothorax.  Small right effusion and scattered atelectasis.   Original Report Authenticated By: Holley Dexter, M.D.    Dg Swallowing Func-speech Pathology  05/01/2012  Carolan Shiver, CCC-SLP     05/01/2012  2:24 PM Objective Swallowing Evaluation: Modified Barium Swallowing Study   Patient Details  Name: Jon [name] MRN: [id number] Date of Birth: 06-05-1965  Today's Date: 05/01/2012 Time: 1210-1240 SLP Time Calculation (min): 30 min  Past Medical History:  Past Medical History  Diagnosis Date  . Anxiety    Past Surgical History:  Past Surgical History  Procedure Date  . Cardiac catheterization   . Rotator cuff repair     12 years ago, Left  . Thoracic aortic aneurysm repair 04/22/2012    Procedure: THORACIC ASCENDING ANEURYSM REPAIR (AAA);  Surgeon:  [name], MD;  Location: [facility] OR;  Service: Open Heart  Surgery;  Laterality: N/A;  . Coronary artery bypass graft 04/22/2012    Procedure: CORONARY ARTERY BYPASS GRAFTING (CABG);  Surgeon:  [name], MD;  Location: [facility] OR;  Service: Open Heart  Surgery;  Laterality: N/A;  times one with right greater  saphenous vein graft  . Intraoperative transesophageal echocardiogram 04/22/2012    Procedure: INTRAOPERATIVE TRANSESOPHAGEAL ECHOCARDIOGRAM;   Surgeon: [name], MD;  Location: [facility] OR;  Service:  Open Heart Surgery;  Laterality: N/A;   HPI:  47 y/o male with history of aortic stenosis since childhood who  developed sudden onset of severe CP.  Patient went to  [facility] ED  where CT angiogram showed a Type A  dissection.  Patient  transferred to [facility] for Thoracic ascending aneurysm repair, CABG  and Intraopertive Transesophageal Echocardiogram.  CXR indicates  Right lung infliltrate . Pt has been followed by SLP to address  dysphagia s/p surgery.  Pt has not been tolerating thin liquids,  with consistent s/s aspiration.  Plan was for FEES today, but due  to instrument malfunction, MBS was ordered as an alternative.        Assessment / Plan / Recommendation Clinical Impression  Dysphagia Diagnosis: Mild pharyngeal phase dysphagia Clinical impression: Pt presents with a pharyngeal dysphagia,  isolated to inadequate laryngeal closure during the pharyngeal  phase.  This impairment leads to consistent aspiration of thin  liquids, accompanied by a nonprotective cough response.  Postural  adjustments (head turn to left and right) increased aspiration; a  chin tuck resulted in liquids penetrating the larynx but not  descending below the vocal cords.  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  After lengthy  discussion with pt and spouse, rec: nectar-thick liquids OR thin  liquids with use of a chin-tuck; meds whole with puree; SLP f/u  for swallowing therapy after D/C from hospital.  Spouse is in  agreement; pt with impaired insight into circumstances.  SLP will  follow here until D/C.    Treatment Recommendation  Therapy as outlined in treatment plan below    Diet Recommendation Regular;Nectar-thick liquid (and thin with  chin tuck)   Liquid Administration via: Cup Medication Administration: Whole meds with puree Supervision: Patient able to self feed;Intermittent supervision  to cue for compensatory strategies Compensations: Slow rate;Small sips/bites Postural Changes and/or Swallow Maneuvers: Chin tuck    Other  Recommendations Oral Care Recommendations: Oral care BID   Follow Up Recommendations       Frequency and Duration min 2x/week  2 weeks    Pertinent Vitals/Pain none    SLP Swallow Goals Patient will consume recommended diet without observed clinical  signs of aspiration with: Supervision/safety Swallow Study Goal #1 - Progress: Progressing toward goal Patient will utilize recommended strategies during swallow to  increase swallowing safety with: Supervision/safety Swallow Study Goal #2 - Progress: Progressing toward goal   General Date of Onset: 04/22/12 HPI: 47 y/o male with history of aortic stenosis since childhood  who developed sudden onset of severe CP.  Patient went to  [facility] ED where CT angiogram showed a Type A  dissection.   Patient transferred to [facility] for Thoracic ascending aneurysm repair,  CABG and Intraopertive Transesophageal Echocardiogram.  CXR  indicates Right lung infliltrate . Pt has been followed by SLP to  address dysphagia s/p surgery.  Pt has not been tolerating thin  liquids, with consistent s/s aspiration.  Plan was for FEES  today, but due to insturment malfunction, MBS was ordered as an  alternative.    Type of Study: Modified Barium Swallowing Study Reason for Referral: Objectively evaluate swallowing function Diet Prior to this Study: Regular;Nectar-thick liquids Temperature Spikes Noted: No Respiratory Status: Room air History of Recent Intubation: Yes Behavior/Cognition: Alert;Impulsive;Decreased sustained attention Oral Cavity - Dentition: Adequate natural dentition Oral Motor / Sensory Function: Within functional limits Self-Feeding Abilities: Able to feed self Patient Positioning: Upright in chair Baseline Vocal Quality: Hoarse;Low vocal intensity Volitional Cough: Weak Volitional Swallow: Able to elicit Anatomy: Within functional limits Pharyngeal Secretions: Not observed secondary MBS    Reason for Referral Objectively evaluate swallowing function   Oral Phase Oral Preparation/Oral Phase Oral Phase: WFL   Pharyngeal Phase Pharyngeal Phase Pharyngeal Phase: Impaired Pharyngeal - Thin Pharyngeal - Thin Cup: Reduced  airway/laryngeal  closure;Penetration/Aspiration during swallow;Moderate  aspiration;Compensatory strategies attempted (Comment) (head turn  left and right; mendelsohn maneuver, chin tuck) Penetration/Aspiration details (thin cup): Material enters  airway, passes BELOW cords and not ejected out despite cough  attempt by patient  Cervical Esophageal Phase    GO   Amanda L. [place], [place] CCC/SLP Pager [phone]  Cervical Esophageal Phase Cervical Esophageal Phase: Leonarda Salon         Blenda Mounts Laurice 05/01/2012, 2:22 PM           Subjective: Patient is breathing better. Denies any shortness of breath. Denies any chest pain. Denies fevers, chills, nausea, vomiting, diarrhea, abdominal pain. He merely 100% of his breakfast.  Objective: Filed Vitals:   05/06/12 2058 05/07/12 0452 05/07/12 1014 05/07/12 1015  BP: 105/70 103/66 86/60 110/58  Pulse: 66 53    Temp: 97.9 F (36.6 C) 97.4 F (36.3 C)    TempSrc: Oral Oral    Resp: 20 20    Height:      Weight:  86.9 kg (191 lb 9.3 oz)    SpO2: 96% 99%  Intake/Output Summary (Last 24 hours) at 05/07/12 1151 Last data filed at 05/07/12 1000  Gross per 24 hour  Intake   1430 ml  Output    475 ml  Net    955 ml   Weight change: 3.7 kg (8 lb 2.5 oz) Exam:   General:  Pt is alert, follows commands appropriately, not in acute distress  HEENT: No icterus, No thrush,[place]on/AT  Cardiovascular: RRR, S1/S2, no rubs, no gallops  Respiratory: CTA bilaterally, no wheezing, no crackles, no rhonchi  Abdomen: Soft/+BS, non tender, non distended, no guarding  Extremities: trace edema, No lymphangitis, No petechiae, No rashes, no synovitis  Data Reviewed: Basic Metabolic Panel:  Lab 05/07/12 1610 05/06/12 0550 05/02/12 0522  NA 137 134* 129*  K 4.2 4.4 3.6  CL 102 99 92*  CO2 30 28 30   GLUCOSE 121* 111* 121*  BUN 14 12 13   CREATININE 0.95 0.82 0.89  CALCIUM 8.1* 7.8* 7.7*  MG -- 2.0 --  PHOS -- 4.0 --   Liver Function Tests:  Lab  05/06/12 0550  AST 36  ALT 248*  ALKPHOS 188*  BILITOT 0.7  PROT 5.0*  ALBUMIN 2.1*   No results found for this basename: LIPASE:5,AMYLASE:5 in the last 168 hours No results found for this basename: AMMONIA:5 in the last 168 hours CBC:  Lab 05/07/12 0045 05/06/12 0550 05/02/12 0522  WBC 14.9* 15.1* 19.8*  NEUTROABS 11.9* 12.7* --  HGB 9.6* 8.5* 9.6*  HCT 29.7* 26.2* 28.4*  MCV 97.1 95.6 94.0  PLT 301 270 237   Cardiac Enzymes:  Lab 05/07/12 0655 05/07/12 0037 05/06/12 1852  CKTOTAL -- -- 63  CKMB -- -- 3.8  CKMBINDEX -- -- --  TROPONINI <0.30 <0.30 0.30*   BNP: No components found with this basename: POCBNP:5 CBG: No results found for this basename: GLUCAP:5 in the last 168 hours  Recent Results (from the past 240 hour(s))  MRSA PCR SCREENING     Status: Normal   Collection Time   05/05/12 11:11 PM      Component Value Range Status Comment   MRSA by PCR NEGATIVE  NEGATIVE Final   CULTURE, EXPECTORATED SPUTUM-ASSESSMENT     Status: Normal   Collection Time   05/06/12  1:37 AM      Component Value Range Status Comment   Specimen Description SPUTUM   Final    Special Requests Normal   Final    Sputum evaluation     Final    Value: THIS SPECIMEN IS ACCEPTABLE. RESPIRATORY CULTURE REPORT TO FOLLOW.   Report Status 05/06/2012 FINAL   Final   CULTURE, RESPIRATORY     Status: Normal (Preliminary result)   Collection Time   05/06/12  1:37 AM      Component Value Range Status Comment   Specimen Description SPUTUM   Final    Special Requests NONE   Final    Gram Stain     Final    Value: MODERATE WBC PRESENT,BOTH PMN AND MONONUCLEAR     MODERATE SQUAMOUS EPITHELIAL CELLS PRESENT     FEW GRAM POSITIVE COCCI IN CLUSTERS     IN PAIRS RARE GRAM NEGATIVE RODS     RARE GRAM POSITIVE RODS   Culture NORMAL OROPHARYNGEAL FLORA   Final    Report Status PENDING   Incomplete      Scheduled Meds:   . amiodarone  200 mg Oral BID  . aspirin  81 mg Oral Daily  . docusate  sodium  100 mg Oral BID  . levofloxacin (LEVAQUIN) IV  750 mg Intravenous Q24H  . metoprolol tartrate  12.5 mg Oral BID  . pantoprazole  40 mg Oral Daily  . sodium chloride  3 mL Intravenous Q12H  . vancomycin  1,000 mg Intravenous Q8H  . Warfarin - Pharmacist Dosing Inpatient   Does not apply q1800   Continuous Infusions:    Nirel Babler, DO  [facility] Pager [phone]  If 7PM-7AM, please contact night-coverage [email or web address] Password [email or web address] 05/07/2012, 11:51 AM   LOS: 2 days

## 2012-05-07 NOTE — Progress Notes (Addendum)
I was notified by nurse pt had some sob after thoracocentesis.  I went to eval patient at 1740. Patient does state he did have some chest congestion and occasional shortness of breath, but that this is not any worse than the past 24 hours. In fact, he states that he might be breathing a little bit better than previously. Denied any chest discomfort. Denied any dizziness. I personally to the patient's blood pressure manually --- Left arm 110/60 Right arm 98/52 Heart rate 82--respirations 18 Nursing has reported discrepancies up to 20 mm of mercury systolic blood pressure between left and right arm.  CV--RRR, no rub Lung--bibasilar crackles,  Excellent air movement without wheeze or rhonchi. Abd--soft/NT,+BS Ext--trace edema, no rash  Metoprolol tartrate decreased by Dr. Patty Sermons earlier.  Hold only if SBP<90.  Thoracocentsis ordered by Dr. Dorris Fetch.  Patient has history of heavy tobacco use, likely underlying COPD. Quit tobacco 3 weeks ago.  Order albuterol and Atrovent nebulizer.  Added cell count to pleural fluid. Chest x-ray after thoracocentesis negative for pneumothorax.  DTat

## 2012-05-07 NOTE — Progress Notes (Signed)
Pt states that it's harder for him to breath since going down for his echo; 97% on 2L-[place]; pts L lobes coarse, R lobes clear; Dr.Tat made aware; no new orders received at this time; will continue to monitor

## 2012-05-07 NOTE — Progress Notes (Signed)
Stroke Team Progress Note  HISTORY  Reason for consult: seizure vs syncope  Jon [name] is an 47 y.o. male PMHx of aortic stenosis since childhood, mechanical AO heart valve, on chronic coumadin with recent Type A dissection requiring emergent repair. Patient was D/C from the hospital on 12/28. On 12/30 patient walked into his room to change his bandage. As he was getting his pants off he felt nauseated and unwell. His wife left the room to get something he could vomit in and when she returned she found him supine in the bed, right arm behind his head, non responsive, eyes rolled back and diaphoretic. She tried to arouse him and only could get partial response. After about 2-3 minutes he came around. HE does not recall the period of time he was unresponsive. There was NO shaking or jerking notes, NO incontinence. After he was aroused he proceeded to vomit. Patient is currently AOx3 , HE is complaining about having difficulty focusing on objects. He states his vision has been off since surgery. He was seen by opthalmology while hospitalized on 12/26. Their exam was essentially normal with possibility of refraction. Patient states his vision will "come and go" and is most note ably compromised when changing focus from a far object to a near object (and vise versa).   Patient was not a TPA candidate secondary to recent heart surgery (2weeks ago). He was admitted to the neuro floor for further evaluation and treatment.   SUBJECTIVE     OBJECTIVE Most recent Vital Signs: Filed Vitals:   05/05/12 2136 05/06/12 0455 05/06/12 2058 05/07/12 0452  BP:  113/71 105/70 103/66  Pulse:  63 66 53  Temp:  97.6 F (36.4 C) 97.9 F (36.6 C) 97.4 F (36.3 C)  TempSrc:  Oral Oral Oral  Resp:  20 20 20   Height:      Weight:  83.3 kg (183 lb 10.3 oz)  86.9 kg (191 lb 9.3 oz)  SpO2: 91% 94% 96% 99%   CBG (last 3)  No results found for this basename: GLUCAP:3 in the last 72 hours  IV Fluid Intake:       MEDICATIONS    . amiodarone  200 mg Oral BID  . docusate sodium  100 mg Oral BID  . levofloxacin (LEVAQUIN) IV  750 mg Intravenous Q24H  . metoprolol tartrate  25 mg Oral BID  . pantoprazole  40 mg Oral Daily  . sodium chloride  3 mL Intravenous Q12H  . vancomycin  1,000 mg Intravenous Q8H  . Warfarin - Pharmacist Dosing Inpatient   Does not apply q1800   PRN:  sodium chloride, acetaminophen, acetaminophen, RESOURCE THICKENUP CLEAR, sodium chloride  Diet:  Dysphagia 3 neck thick  Activity:  Up with assistance DVT Prophylaxis:  Coumadin but subtherapeutic  CLINICALLY SIGNIFICANT STUDIES Basic Metabolic Panel:  Lab 05/07/12 1308 05/06/12 0550  NA 137 134*  K 4.2 4.4  CL 102 99  CO2 30 28  GLUCOSE 121* 111*  BUN 14 12  CREATININE 0.95 0.82  CALCIUM 8.1* 7.8*  MG -- 2.0  PHOS -- 4.0   Liver Function Tests:  Lab 05/06/12 0550  AST 36  ALT 248*  ALKPHOS 188*  BILITOT 0.7  PROT 5.0*  ALBUMIN 2.1*   CBC:  Lab 05/07/12 0045 05/06/12 0550  WBC 14.9* 15.1*  NEUTROABS 11.9* 12.7*  HGB 9.6* 8.5*  HCT 29.7* 26.2*  MCV 97.1 95.6  PLT 301 270   Coagulation:  Lab 05/07/12 0045 05/06/12  0550 05/03/12 0451 05/02/12 0522  LABPROT 18.6* 21.7* 21.4* 22.3*  INR 1.61* 1.98* 1.94* 2.05*   Cardiac Enzymes:  Lab 05/07/12 0655 05/07/12 0037 05/06/12 1852  CKTOTAL -- -- 63  CKMB -- -- 3.8  CKMBINDEX -- -- --  TROPONINI <0.30 <0.30 0.30*   Urinalysis: No results found for this basename: COLORURINE:2,APPERANCEUR:2,LABSPEC:2,PHURINE:2,GLUCOSEU:2,HGBUR:2,BILIRUBINUR:2,KETONESUR:2,PROTEINUR:2,UROBILINOGEN:2,NITRITE:2,LEUKOCYTESUR:2 in the last 168 hours Lipid Panel No results found for this basename: chol, trig, hdl, cholhdl, vldl, ldlcalc   HgbA1C  No results found for this basename: HGBA1C    Urine Drug Screen:   No results found for this basename: labopia, cocainscrnur, labbenz, amphetmu, thcu, labbarb    Alcohol Level: No results found for this basename: ETH:2 in the last  168 hours  X-ray Chest Pa And Lateral  05/06/2012 : Improving interstitial edema pattern and vascular congestion. Continued bibasilar atelectasis and small effusions   Mr Laqueta Jean Wo Contrast 05/06/2012 Definite new punctate infarction within the left cerebellum. Question a few other punctate infarctions in the occipital lobes, though the findings are at the borderline of detectability.  The findings raise the possibility of ongoing posterior circulation micro emboli.    CT of the brain    MRA of the brain  pending  MRA of the neck  pending  2D Echocardiogram   05/07/2012-- 04/28/2013   EF 40% diffuse hypokinesis, mechanical prosthesis aortic valve  Carotid Doppler await MRI scan   EKG  atrial fibrillation. 04/29/2012   Therapy Recommendations ---  Physical Exam   The patient is alert and cooperative at the time of examination.  Extraocular movements are full in the horizontal plane, the patient has severe limitation of superior gaze, the patient can generate inferior gaze.  Visual fields are full. Speech is slightly dysarthric, not aphasic.  The patient is able to move all 4 extremities well, no drift is seen.  The patient has good finger-nose-finger and heel-to-shin bilaterally. Gait was not tested.  Deep tendon reflexes are symmetric.      ASSESSMENT [name] is a 47 y.o. male presenting with difficulty focusing on objects,nausea, vomiting, fall, non-responsive for short time but then could get him to partially respond, after a few minutes he came back to his normal state. He did not remember the occurrence. She visualized no shaking, no incontinence but that his eyes rolled back in his head and he was diaphorectic. Imaging reveals definite new punctate infarction within the left cerebellum and with possible punctate infarctions in occiptal lobes consistent with micro-emboli. Work up underway. On warfarin prior to admission. Now on warfarin for secondary  stroke prevention. Patient with resultant resolved symptoms in ER.   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 This is not documented on initial neurologic consultation. The patient may have sustained a new dorsal midbrain stroke since the last MRI study. The patient has evidence of active microemboli by MRI. MRA of the head and neck is pending. The patient is on Coumadin, but the INR subtherapeutic. I would agree with the addition of aspirin at this time. The patient appears to be neurologically unstable at this time.   Hospital day # 2  TREATMENT/PLAN  Continue warfarin for secondary stroke prevention. Since INR is subtherapeutic will add SCD. Primary service will need  to address with cardiac teams to see if it is safe to use lovenox as a bridge to therapeutic INR.  INR is subtherapeutic. Add SCD.  MRA head/neck  Lipids, hgb a1c pending  Therapy evaluations pending  Risk factor modification  Agree with low dose aspirin therapy  Gwendolyn Lima. Manson Passey, [facility], MBA, MHA [facility] Pager: [phone] 05/07/2012 8:32 AM   I have personally obtained a history, examined the patient, evaluated imaging results, and formulated the assessment and plan of care. I agree with the above.   Lesly Dukes

## 2012-05-07 NOTE — Evaluation (Signed)
Clinical/Bedside Swallow Evaluation Patient Details  Name: Jon [name] MRN: [id number] Date of Birth: 1965-10-06  Today's Date: 05/07/2012 Time: 1017-1040 SLP Time Calculation (min): 23 min  Past Medical History:  Past Medical History  Diagnosis Date  . Anxiety   . Bicuspid aortic valve   . Aortic stenosis     mechanical AVR 04/22/12  . Shortness of breath   . Dysrhythmia   . Thoracic aortic aneurysm     replacement aortic root 04/22/12  . 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 Cardiac catheterization   . Rotator cuff repair     12 years ago, Left  . Thoracic aortic aneurysm repair 04/22/2012    Procedure: THORACIC ASCENDING ANEURYSM REPAIR (AAA);  Surgeon: [name], MD;  Location: [facility] OR;  Service: Open Heart Surgery;  Laterality: N/A;  . Coronary artery bypass graft 04/22/2012    Procedure: CORONARY ARTERY BYPASS GRAFTING (CABG);  Surgeon: [name], MD;  Location: [facility] OR;  Service: Open Heart Surgery;  Laterality: N/A;  times one with right greater saphenous vein graft  . Intraoperative transesophageal echocardiogram 04/22/2012    Procedure: INTRAOPERATIVE TRANSESOPHAGEAL ECHOCARDIOGRAM;  Surgeon: [name], MD;  Location: [facility] OR;  Service: Open Heart Surgery;  Laterality: N/A;   HPI:  [name] is a 47 y.o. male presenting with difficulty focusing on objects,nausea, vomiting, fall, non-responsive for short time but then could get him to partially respond, after a few minutes he came back to his normal state. He did not remember the occurrence. She visualized no shaking, no incontinence but that his eyes rolled back in his head and he was diaphorectic. Imaging reveals definite new punctate infarction within the left cerebellum and with possible punctate infarctions in occiptal lobes consistent with micro-emboli. Work up underway. On warfarin prior to  admission. Now on warfarin for secondary stroke prevention. Patient with resultant resolved symptoms in ER.  During last hospital stay pt had MBS showing aspiratin of thin liquids improved with chin tuck or with nectar thick consistency. 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 Pt tolerated this diet until d/c with wife demonstrating understanding of diet and precautions. Upon d/c wife reports pt with impulsivity, having some choking with nectar thick liquids. At this time his CXR has improved since d/c.    Assessment / Plan / Recommendation Clinical Impression  Pt demonstrated hard congested coughing before during and after trials of liquid and solid consistencies. Pt observed to be impulsive with poor safety awareness though he is able to verbalize need for thickner and aspiration precautions. He also completed chin tuck without cues when drinking thin water. Skilled treatment included moderate verbal cues for appropriate posture and bolus size for increased emergent awareness of precautions. Given inconsistent findings, worsening cough and new CVA feel f/u objective testing is warranted prior to d/c. Pt and wife are concerned about dysphagia. Recommend FEES if possible tomorrow for direct visualization of vocal folds as desired in previous assessment. Pt in agreement. Pt may continue current diet at thist ime, but with full supervision to encourage small sips and appropriate posture.     Aspiration Risk  Moderate    Diet Recommendation Dysphagia 3 (Mechanical Soft);Nectar-thick liquid   Liquid Administration via: Cup;No straw Medication Administration: Whole meds with puree Supervision: Patient able to self feed;Full supervision/cueing for compensatory strategies Compensations: Slow rate;Small sips/bites Postural Changes and/or Swallow  Maneuvers: Chin tuck    Other  Recommendations Recommended Consults: FEES Oral Care Recommendations: Oral care BID Other  Recommendations: Order thickener from pharmacy   Follow Up Recommendations  Outpatient SLP    Frequency and Duration        Pertinent Vitals/Pain NA    SLP Swallow Goals     Swallow Study Prior Functional Status       General HPI: [name] is a 47 y.o. male presenting with difficulty focusing on objects,nausea, vomiting, fall, non-responsive for short time but then could get him to partially respond, after a few minutes he came back to his normal state. He did not remember the occurrence. She visualized no shaking, no incontinence but that his eyes rolled back in his head and he was diaphorectic. Imaging reveals definite new punctate infarction within the left cerebellum and with possible punctate infarctions in occiptal lobes consistent with micro-emboli. Work up underway. On warfarin prior to admission. Now on warfarin for secondary stroke prevention. Patient with resultant resolved symptoms in ER.  During last hospital stay pt had MBS showing aspiratin of thin liquids improved with chin tuck or with nectar thick consistency. 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 Pt tolerated this diet until d/c with wife demonstrating understanding of diet and precautions. Upon d/c wife reports pt with impulsivity, having some choking with nectar thick liquids. At this time his CXR has improved since d/c.  Type of Study: Bedside swallow evaluation Previous Swallow Assessment: MBS 05/01/12 Pt presents with a pharyngeal dysphagia, isolated to inadequate laryngeal closure during the pharyngeal phase.  This impairment leads to consistent aspiration of thin liquids, accompanied by a nonprotective cough response.  Postural adjustments (head turn to left and right) increased aspiration; a chin tuck resulted in liquids penetrating the larynx but not descending below the vocal cords.  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  After lengthy  discussion with pt and spouse, rec: nectar-thick liquids OR thin liquids with use of a chin-tuck; meds whole with puree;  Diet Prior to this Study: Dysphagia 3 (soft);Nectar-thick liquids Temperature Spikes Noted: No Respiratory Status: Room air History of Recent Intubation: Yes Length of Intubations (days): 1 days Date extubated: 04/27/12 Behavior/Cognition: Alert;Impulsive;Decreased sustained attention Oral Cavity - Dentition: Adequate natural dentition Self-Feeding Abilities: Able to feed self Patient Positioning: Upright in bed Baseline Vocal Quality: Hoarse Volitional Cough: Strong;Congested Volitional Swallow: Able to elicit    Oral/Motor/Sensory Function Overall Oral Motor/Sensory Function: Appears within functional limits for tasks assessed   Ice Chips     Thin Liquid Thin Liquid: Impaired Presentation: Cup Pharyngeal  Phase Impairments: Cough - Immediate    Nectar Thick Nectar Thick Liquid: Impaired Presentation: Cup;Self Fed Pharyngeal Phase Impairments: Cough - Immediate;Cough - Delayed   Honey Thick Honey Thick Liquid: Not tested   Puree Puree: Impaired Presentation: Spoon Pharyngeal Phase Impairments: Cough - Immediate;Cough - Delayed   Solid   GO    Solid: Impaired Pharyngeal Phase Impairments: Cough - Delayed;Cough - Immediate      Harlon Ditty, MA CCC-SLP [phone]  Nickalous Stingley, [name] Nearing 05/07/2012,10:59 AM

## 2012-05-07 NOTE — Procedures (Signed)
US guided diagnostic/therapeutic right thoracentesis performed yielding 1.8 liters blood-tinged fluid. A portion of the fluid was sent for culture. F/u CXR pending. No immediate complications.

## 2012-05-07 NOTE — Progress Notes (Signed)
Patient ID: Jon [name] MRN: [id number] DOB/AGE: 05/14/1965 47 y.o.  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 His dissection extended to the left internal carotid artery. His hospital course was complicated by aspiration pneumonia and visual changes felt to be due to small occipital CVA. He also had post-op atrial fibrillation but converted to NSR on amiodarone therapy before discharge. He was discharged home on 05/03/12 and then readmitted on 05/05/12 after a syncopal episode at home, LOC for 1-2 minutes. He denies any chest pain or palpitations around the event. He notes feeling nauseous and vomiting and then he was told by his wife that he had passed out. He does not recall passing out. EEG today without any conclusive findings. Neurology consult pending. Echo 04/28/12 with mild LV systolic dsyfunction with LVEF 40-45%. EKG with NSR, 1st degree AV block, RBBB, LAFB, lateral TWI, poor R wave progression through precordial leads. No carotid artery dopplers have been performed.     Past Medical History  Diagnosis Date  . Anxiety   . Bicuspid aortic valve   . Aortic stenosis     mechanical AVR 04/22/12  . Shortness of breath   . Dysrhythmia   . Thoracic aortic aneurysm     replacement aortic root 04/22/12  . CVA (cerebral infarction)     occipital CVA 12/13    Family History  Problem Relation Age of Onset  . Lung disease Mother   . Diabetes Father   . Peripheral Artery Disease Father   . Hypertension Father     History   Social History  . Marital Status: Married    Spouse Name: N/A    Number of Children: N/A  . Years of  Education: N/A   Occupational History  . Not on file.   Social History Main Topics  . Smoking status: Current Every Day Smoker -- 2.5 packs/day    Types: Cigarettes  . Smokeless tobacco: Not on file  . Alcohol Use: Yes     Comment: last drink 1.5 years ago, heavy drinker prior  . Drug Use: No     Comment: previous crack cocaine "quit 4 years ago"  . Sexually Active: Yes   Other Topics Concern  . Not on file   Social History Narrative  . No narrative on file    Past Surgical History  Procedure Date  . Cardiac catheterization   . Rotator cuff repair     12 years ago, Left  . Thoracic aortic aneurysm repair 04/22/2012    Procedure: THORACIC ASCENDING ANEURYSM REPAIR (AAA);  Surgeon: [name], MD;  Location: [facility] OR;  Service: Open Heart Surgery;  Laterality: N/A;  . Coronary artery bypass graft 04/22/2012    Procedure: CORONARY ARTERY BYPASS GRAFTING (CABG);  Surgeon: [name], MD;  Location: [facility] OR;  Service: Open Heart Surgery;  Laterality: N/A;  times one with right greater saphenous vein graft  . Intraoperative transesophageal echocardiogram 04/22/2012    Procedure: INTRAOPERATIVE TRANSESOPHAGEAL ECHOCARDIOGRAM;  Surgeon: [name], MD;  Location: [facility] OR;  Service: Open Heart Surgery;  Laterality: N/A;    Allergies  Allergen Reactions  . Dilaudid (Hydromorphone Hcl) Other (See Comments)  High doses cause itching  . Penicillins Other (See Comments)    Unknown    Current Meds:     . amiodarone  200 mg Oral BID  . docusate sodium  100 mg Oral BID  . levofloxacin (LEVAQUIN) IV  750 mg Intravenous Q24H  . metoprolol tartrate  25 mg Oral BID  . pantoprazole  40 mg Oral Daily  . sodium chloride  3 mL Intravenous Q12H  . vancomycin  1,000 mg Intravenous Q8H  . Warfarin - Pharmacist Dosing Inpatient   Does not apply q1800    Review of systems complete and found to be negative unless listed above    Physical Exam: Blood pressure  103/66, pulse 53, temperature 97.4 F (36.3 C), temperature source Oral, resp. rate 20, height 5\' 11"  (1.803 m), weight 191 lb 9.3 oz (86.9 kg), SpO2 99.00%.    The patient is off the floor in EEG.    Labs: INR:1.98   Lab Results  Component Value Date   WBC 14.9* 05/07/2012   HGB 9.6* 05/07/2012   HCT 29.7* 05/07/2012   MCV 97.1 05/07/2012   PLT 301 05/07/2012     Lab 05/07/12 0045 05/06/12 0550  NA 137 --  K 4.2 --  CL 102 --  CO2 30 --  BUN 14 --  CREATININE 0.95 --  CALCIUM 8.1* --  PROT -- 5.0*  BILITOT -- 0.7  ALKPHOS -- 188*  ALT -- 248*  AST -- 36  GLUCOSE 121* --    Radiology: Bilateral pleural effusions, pulmonary edema noted to be improving.   EKG: NSR, 1st degree AV block, RBBB, LAFB, lateral TWI, poor R wave progression through precordial leads.  MRI brain:  Definite new punctate infarction within the left cerebellum.  Question a few other punctate infarctions in the occipital lobes,  though the findings are at the borderline of detectability. The  findings raise the possibility of ongoing posterior circulation  micro emboli.   ASSESSMENT AND PLAN:   1. Syncope:   The MRI of the brain reveals a NEW punctate lesion in the cerebelum compared to the MRI from several days prior.   I suspect this is the cause of his episode of syncope.  His INR is subtheraputic now but was theraputic 12/27 and just fell below 2.0 on 12/28.  This could have a thrombus that formed around the time of his original dissection and dislodged at a later time.  He had transient atrial fibrillation peripoeratively.   Add ASA 81 a day to help minimize thrombus formation on the aortic valve.  Cardiac enzymes are negative - doubt this was due to coronary ischemia.  Will continue to monitor.  We can consider an outpatient event monitor.       Signed: Elyn Aquas. 05/07/2012, 9:05 AM

## 2012-05-07 NOTE — Progress Notes (Signed)
Repeat EEG completed

## 2012-05-08 ENCOUNTER — Inpatient Hospital Stay (HOSPITAL_COMMUNITY): Payer: Medicaid Other

## 2012-05-08 DIAGNOSIS — F411 Generalized anxiety disorder: Secondary | ICD-10-CM

## 2012-05-08 DIAGNOSIS — I6330 Cerebral infarction due to thrombosis of unspecified cerebral artery: Secondary | ICD-10-CM

## 2012-05-08 DIAGNOSIS — G931 Anoxic brain damage, not elsewhere classified: Secondary | ICD-10-CM

## 2012-05-08 LAB — CBC WITH DIFFERENTIAL/PLATELET
Basophils Absolute: 0 10*3/uL (ref 0.0–0.1)
Basophils Relative: 0 % (ref 0–1)
Eosinophils Absolute: 0.1 10*3/uL (ref 0.0–0.7)
Eosinophils Relative: 1 % (ref 0–5)
MCH: 31.2 pg (ref 26.0–34.0)
MCHC: 31.9 g/dL (ref 30.0–36.0)
Neutrophils Relative %: 79 % — ABNORMAL HIGH (ref 43–77)
Platelets: 337 10*3/uL (ref 150–400)
RBC: 3.01 MIL/uL — ABNORMAL LOW (ref 4.22–5.81)
RDW: 19.9 % — ABNORMAL HIGH (ref 11.5–15.5)

## 2012-05-08 LAB — BASIC METABOLIC PANEL
Calcium: 8 mg/dL — ABNORMAL LOW (ref 8.4–10.5)
GFR calc Af Amer: >90 mL/min (ref >90)
GFR calc non Af Amer: >90 mL/min (ref >90)
Potassium: 5.1 mEq/L (ref 3.5–5.1)
Sodium: 138 mEq/L (ref 135–145)

## 2012-05-08 LAB — LIPID PANEL
HDL: 34 mg/dL — ABNORMAL LOW (ref >39)
LDL Cholesterol: 69 mg/dL (ref 0–99)
Total CHOL/HDL Ratio: 3.4 RATIO
Triglycerides: 54 mg/dL (ref <150)

## 2012-05-08 LAB — PATHOLOGIST SMEAR REVIEW: Path Review: NORMAL

## 2012-05-08 LAB — PROTIME-INR
INR: 1.56 — ABNORMAL HIGH (ref 0.00–1.49)
Prothrombin Time: 18.2 seconds — ABNORMAL HIGH (ref 11.6–15.2)

## 2012-05-08 LAB — BODY FLUID CELL COUNT WITH DIFFERENTIAL

## 2012-05-08 LAB — HEMOGLOBIN A1C: Hgb A1c MFr Bld: 5 % (ref <5.7)

## 2012-05-08 LAB — CULTURE, RESPIRATORY W GRAM STAIN

## 2012-05-08 MED ORDER — IPRATROPIUM BROMIDE 0.02 % IN SOLN: 0.5000 mg | RESPIRATORY_TRACT | Status: DC | PRN

## 2012-05-08 MED ORDER — WARFARIN SODIUM 7.5 MG PO TABS
7.5000 mg | ORAL_TABLET | Freq: Once | ORAL | Status: DC
  Administered 2012-05-08: 7.5 mg via ORAL
  Filled 2012-05-08: qty 1

## 2012-05-08 MED ORDER — ALBUTEROL SULFATE (5 MG/ML) 0.5% IN NEBU: 2.5000 mg | INHALATION_SOLUTION | Freq: Four times a day (QID) | RESPIRATORY_TRACT | Status: DC

## 2012-05-08 MED ORDER — ALBUTEROL SULFATE (5 MG/ML) 0.5% IN NEBU: 2.5000 mg | INHALATION_SOLUTION | RESPIRATORY_TRACT | Status: DC | PRN

## 2012-05-08 MED ORDER — HEPARIN (PORCINE) IN NACL 100-0.45 UNIT/ML-% IJ SOLN
1800.0000 [IU]/h | INTRAMUSCULAR | Status: DC
  Administered 2012-05-08: 1200 [IU]/h via INTRAVENOUS
  Administered 2012-05-09: 1800 [IU]/h via INTRAVENOUS
  Administered 2012-05-09: 1500 [IU]/h via INTRAVENOUS
  Filled 2012-05-08 (×4): qty 250

## 2012-05-08 NOTE — Procedures (Signed)
Objective Swallowing Evaluation: Modified Barium Swallowing Study  Patient Details  Name: Nyan Dufresne MRN: [id number] Date of Birth: [birth year]  Today's Date: 05/08/2012 Time:  -     Past Medical History:  Past Medical History  Diagnosis Date  . Anxiety   . Bicuspid aortic valve   . Aortic stenosis     mechanical AVR 04/22/12  . Shortness of breath   . Dysrhythmia   . Thoracic aortic aneurysm     replacement aortic root 04/22/12  . 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 Cardiac catheterization   . Rotator cuff repair     12 years ago, Left  . Thoracic aortic aneurysm repair 04/22/2012    Procedure: THORACIC ASCENDING ANEURYSM REPAIR (AAA);  Surgeon: [name], MD;  Location: [facility] OR;  Service: Open Heart Surgery;  Laterality: N/A;  . Coronary artery bypass graft 04/22/2012    Procedure: CORONARY ARTERY BYPASS GRAFTING (CABG);  Surgeon: [name], MD;  Location: [facility] OR;  Service: Open Heart Surgery;  Laterality: N/A;  times one with right greater saphenous vein graft  . Intraoperative transesophageal echocardiogram 04/22/2012    Procedure: INTRAOPERATIVE TRANSESOPHAGEAL ECHOCARDIOGRAM;  Surgeon: [name], MD;  Location: [facility] OR;  Service: Open Heart Surgery;  Laterality: N/A;   HPI:  Mr. Crimson Beer is a 47 y.o. male presenting with difficulty focusing on objects,nausea, vomiting, fall, non-responsive for short time but then could get him to partially respond, after a few minutes he came back to his normal state. He did not remember the occurrence. She visualized no shaking, no incontinence but that his eyes rolled back in his head and he was diaphorectic. Imaging reveals definite new punctate infarction within the left cerebellum and with possible punctate infarctions in occiptal lobes consistent with micro-emboli. Work up underway. On warfarin prior to  admission. Now on warfarin for secondary stroke prevention. Patient with resultant resolved symptoms in ER.  During last hospital stay pt had MBS showing aspiratin of thin liquids improved with chin tuck or with nectar thick consistency. 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 Pt tolerated this diet until d/c with wife demonstrating understanding of diet and precautions. Upon d/c wife reports pt with impulsivity, having some choking with nectar thick liquids. At this time his CXR has improved since d/c.      Assessment / Plan / Recommendation Clinical Impression  Dysphagia Diagnosis: Mild pharyngeal phase dysphagia Clinical impression: Patient with improved swallow function when compared with the study on 05/01/12, however, patient now with a mild pharyngeal dysphagia, characterized by reduced epiglottic inversion, anterior hyoid movement, and laryngeal elevation, resulting in penetration of both thin and nectar thick liquids.  There was no penetration when a chin tuck head position was utilized.  There was no delay to initiate the swallow, and no laryngeal residue.  No aspiration was observed with any consistenciy.  Patient remains impulsive, and will require full supervision, at least initially, to ensure small bites/sips, and use of chin tuck.     Treatment Recommendation  Therapy as outlined in treatment plan below    Diet Recommendation Regular;Thin liquid   Liquid Administration via: Cup;No straw Medication Administration: Whole meds with puree Supervision: Patient able to self feed;Full supervision/cueing for compensatory strategies Compensations: Slow rate;Small sips/bites Postural Changes and/or Swallow Maneuvers: Chin tuck    Other  Recommendations Recommended Consults:  (FEES equipment is  down.  MBS was completed instead.) Oral Care Recommendations: Oral care BID   Follow Up Recommendations  Inpatient Rehab    Frequency and Duration min 2x/week  2  weeks   Pertinent Vitals/Pain n/a    SLP Swallow Goals Patient will consume recommended diet without observed clinical signs of aspiration with: Minimal assistance;Supervision/safety Patient will utilize recommended strategies during swallow to increase swallowing safety with: Minimal assistance;Supervision/safety   General HPI: Mr. Laterrian Hevener is a 47 y.o. male presenting with difficulty focusing on objects,nausea, vomiting, fall, non-responsive for short time but then could get him to partially respond, after a few minutes he came back to his normal state. He did not remember the occurrence. She visualized no shaking, no incontinence but that his eyes rolled back in his head and he was diaphorectic. Imaging reveals definite new punctate infarction within the left cerebellum and with possible punctate infarctions in occiptal lobes consistent with micro-emboli. Work up underway. On warfarin prior to admission. Now on warfarin for secondary stroke prevention. Patient with resultant resolved symptoms in ER.  During last hospital stay pt had MBS showing aspiratin of thin liquids improved with chin tuck or with nectar thick consistency. 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 Pt tolerated this diet until d/c with wife demonstrating understanding of diet and precautions. Upon d/c wife reports pt with impulsivity, having some choking with nectar thick liquids. At this time his CXR has improved since d/c.  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  This impairment leads to consistent aspiration of thin liquids, accompanied by a nonprotective cough response.  Postural adjustments (head turn to left and right) increased aspiration; a chin tuck resulted in  liquids penetrating the larynx but not descending below the vocal cords.  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  After lengthy discussion with pt and spouse, rec: nectar-thick liquids OR thin liquids with use of a chin-tuck; meds whole with puree;  Diet Prior to this Study: Regular;Nectar-thick liquids Temperature Spikes Noted: No Respiratory Status: Room air History of Recent Intubation: Yes Length of Intubations (days): 1 days Date extubated: 04/27/12 Behavior/Cognition: Alert;Impulsive;Decreased sustained attention Oral Cavity - Dentition: Adequate natural dentition Oral Motor / Sensory Function: Within functional limits Self-Feeding Abilities: Able to feed self;Needs assist Patient Positioning: Upright in chair Baseline Vocal Quality: Hoarse Volitional Cough: Strong;Congested Volitional Swallow: Able to elicit Anatomy: Within functional limits Pharyngeal Secretions: Not observed secondary MBS    Reason for Referral Objectively evaluate swallowing function   Oral Phase Oral Preparation/Oral Phase Oral Phase: WFL   Pharyngeal Phase Pharyngeal Phase Pharyngeal Phase: Impaired Pharyngeal - Nectar Pharyngeal - Nectar Teaspoon: Reduced laryngeal elevation;Reduced epiglottic inversion;Reduced anterior laryngeal mobility;Penetration/Aspiration during swallow Pharyngeal - Nectar Cup: Reduced epiglottic inversion;Reduced anterior laryngeal mobility;Reduced laryngeal elevation;Penetration/Aspiration during swallow Pharyngeal - Thin Pharyngeal - Thin Teaspoon: Reduced anterior laryngeal mobility;Reduced laryngeal elevation;Reduced epiglottic inversion;Penetration/Aspiration during swallow Pharyngeal - Thin Cup: Reduced epiglottic inversion;Reduced anterior laryngeal mobility;Reduced laryngeal elevation;Penetration/Aspiration during swallow Penetration/Aspiration details (thin cup): Material enters airway, CONTACTS cords and not ejected out  Cervical Esophageal  Phase    GO    Cervical Esophageal Phase Cervical Esophageal Phase: Vicente Masson T 05/08/2012, 4:08 PM

## 2012-05-08 NOTE — Progress Notes (Signed)
Speech Language Pathology Dysphagia Treatment Patient Details Name: Art Levan MRN: [id number] DOB: [birth year] Today's Date: 05/08/2012 Time: 3086-5784 SLP Time Calculation (min): 46 min  Assessment / Plan / Recommendation Clinical Impression  Patient was noted to be coughing prior to p.o.'s given.  Delayed congested cough noted.  Patient continues to be impulsive, taking large bites and eating rapidly.  Patient becomes agitated when wife cues him to slow down.  In light of patient having a new left CVA since the previous MBS, and persistent bibasilar ATX vs. Infiltrates on CXR of 05/07/12, a repeat MBS is indicated.  Patient and wife agree.  MBS scheduled for today at 1400.    Diet Recommendation  Continue with Current Diet: Regular;Nectar-thick liquid    SLP Plan Continue with current plan of care   Pertinent Vitals/Pain N/A   Swallowing Goals  SLP Swallowing Goals Patient will consume recommended diet without observed clinical signs of aspiration with: Supervision/safety Swallow Study Goal #1 - Progress: Progressing toward goal Patient will utilize recommended strategies during swallow to increase swallowing safety with: Supervision/safety Swallow Study Goal #2 - Progress: Progressing toward goal  General Temperature Spikes Noted: No Respiratory Status: Room air Behavior/Cognition: Alert;Impulsive;Decreased sustained attention Oral Cavity - Dentition: Adequate natural dentition Patient Positioning: Upright in bed  Oral Cavity - Oral Hygiene Does patient have any of the following "at risk" factors?: Other - dysphagia;Diet - patient on thickened liquids Brush patient's teeth BID with toothbrush (using toothpaste with fluoride): Yes Patient is HIGH RISK - Oral Care Protocol followed (see row info): Yes Patient is AT RISK - Oral Care Protocol followed (see row info): Yes Patient is mechanically ventilated, follow VAP prevention protocol for oral care: Oral care provided every  4 hours   Dysphagia Treatment Treatment focused on: Skilled observation of diet tolerance;Patient/family/caregiver education;Utilization of compensatory strategies Family/Caregiver Educated: Wife Treatment Methods/Modalities: Skilled observation;Differential diagnosis Patient observed directly with PO's: Yes Type of PO's observed: Dysphagia 3 (soft);Nectar-thick liquids Feeding: Able to feed self Liquids provided via: Cup Pharyngeal Phase Signs & Symptoms: Delayed cough;Wet vocal quality Type of cueing: Verbal Amount of cueing: Moderate   GO     Maryjo Rochester T 05/08/2012, 11:03 AM

## 2012-05-08 NOTE — Evaluation (Signed)
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 His dissection extended to the left internal carotid artery. His hospital course was complicated by aspiration pneumonia and visual changes felt to be due to small occipital CVA. He also had post-op atrial fibrillation but converted to NSR on amiodarone therapy before discharge. He was discharged home on 05/03/12 and then readmitted on 05/05/12 after a syncopal episode at home, LOC for 1-2 minutes. MRI of the brain showed new punctate infarct within the left cerebellum question a few other punctate infarcts in the occipital lobes. Pt presents with unsteady gait, gross motor deficits, limited ocular ROM when eyes tested separately and continued (from last admit) difficulty focusing. Pt will benefit from skilled OT in the acute setting to maximize I with ADL and ADL mobility. Recommend CIR for d/c plan.    OT Assessment  Patient needs continued OT Services    Follow Up Recommendations  CIR    Barriers to Discharge      Equipment Recommendations  None recommended by OT    Recommendations for Other Services Rehab consult  Frequency  Min 3X/week    Precautions / Restrictions Precautions Precautions: Fall Precaution Comments: Educated pt and spouse extensively in falls precaution, components of balance testing, vestibular and propriception function in balance.  Restrictions Weight Bearing Restrictions: No   Pertinent Vitals/Pain Pt reports headache during vision testing. Subsided after  termination testing    ADL  Grooming: Min guard Where Assessed - Grooming: Supported standing Upper Body Dressing: Minimal assistance Where Assessed - Upper Body Dressing: Supported sitting Lower Body Dressing: Minimal assistance Where Assessed - Lower Body Dressing: Supported sit to Pharmacist, hospital: Hydrographic surveyor Method: Sit to Barista: Materials engineer and Hygiene: Minimal assistance Where Assessed - Engineer, mining and Hygiene: Standing Equipment Used: Gait belt Transfers/Ambulation Related to ADLs: Min A with ambulation as pt impulsive and occasionally unsteady    OT Diagnosis: Generalized weakness;Acute pain;Cognitive deficits;Disturbance of vision  OT Problem List: Decreased activity tolerance;Impaired balance (sitting and/or standing);Impaired vision/perception;Decreased coordination;Decreased cognition;Decreased safety awareness;Decreased knowledge of precautions;Decreased knowledge of use of DME or AE;Pain;Impaired UE functional use OT Treatment Interventions: Self-care/ADL training;DME and/or AE instruction;Therapeutic activities;Cognitive remediation/compensation;Visual/perceptual remediation/compensation;Patient/family education;Balance training   OT Goals Acute Rehab OT Goals OT Goal Formulation: With patient Time For Goal Achievement: 05/22/12 Potential to Achieve Goals: Good ADL Goals Pt Will Perform Grooming: Independently;Standing at sink ADL Goal: Grooming - Progress: Goal set today Pt Will Perform Upper Body Dressing: Independently;Sitting, bed;Sitting, chair ADL Goal: Upper Body Dressing - Progress: Goal set today Pt Will Perform Lower Body Dressing: Independently;Sit to stand from chair;Sit to stand from bed ADL Goal: Lower Body Dressing - Progress: Goal set today Pt Will Transfer to Toilet: with modified independence;Ambulation;with DME ADL Goal: Toilet Transfer -  Progress: Goal set today Pt Will Perform Toileting - Clothing Manipulation: Independently;Standing ADL Goal: Toileting - Clothing Manipulation - Progress: Goal set today Pt Will Perform Toileting - Hygiene: Independently;Sit to stand from 3-in-1/toilet ADL Goal: Toileting - Hygiene - Progress: Goal set today Pt Will Perform Tub/Shower Transfer:  Tub transfer;Independently;Ambulation ADL Goal: Tub/Shower Transfer - Progress: Goal set today Additional ADL Goal #1: Pt will be I with vision exercises. ADL Goal: Additional Goal #1 - Progress: Goal set today  Visit Information  Last OT Received On: 05/08/12 Assistance Needed: +1    Subjective Data  Subjective: "Yes ma'am" Patient Stated Goal: Return home with wife   Prior Functioning     Home Living Lives With: Spouse Available Help at Discharge: Family Type of Home: House Home Access: Stairs to enter Secretary/administrator of Steps: 4 Entrance Stairs-Rails: Right;Left Home Layout: One level Bathroom Shower/Tub: Forensic scientist: Standard Bathroom Accessibility: Yes Prior Function Level of Independence: Independent Able to Take Stairs?: Yes Driving: Yes Vocation: Full time employment Comments: Financial trader Communication: No difficulties Dominant Hand: Right         Vision/Perception Vision - Assessment Additional Comments: Pt with limited superior ROM in Right eye and limited temporal ROM in left eye. Conjugately, eyes are able to achieve full ROM during testing. However, pt tends to keep downward gaze even when looking at person talking to him. Pt reports inconsistently blurry vision. Pt describes what sounds like accomodation deficits- ophthalmology note from previous admission states this may be secondary to CVA. All peripheral vision appears intact. Pt got headache during vision eval- therefore somewhat limited. Will continue to assess, formally and during activity. Eyes do appear to be  in alignment, however at times would question this. Pt with no c/o double vision   Cognition  Overall Cognitive Status: Appears within functional limits for tasks assessed/performed Arousal/Alertness: Awake/alert Orientation Level: Appears intact for tasks assessed Behavior During Session: [facility] for tasks performed    Extremity/Trunk Assessment Right Upper Extremity Assessment RUE ROM/Strength/Tone: Within functional levels RUE Sensation: WFL - Light Touch RUE Coordination: WFL - fine motor (gross motor deficits) Left Upper Extremity Assessment LUE ROM/Strength/Tone: Within functional levels LUE Sensation: WFL - Light Touch LUE Coordination: WFL - fine motor (gross motor deficits) Right Lower Extremity Assessment RLE ROM/Strength/Tone: WFL for tasks assessed RLE Sensation: WFL - Light Touch;WFL - Proprioception RLE Coordination: WFL - gross/fine motor Left Lower Extremity Assessment LLE ROM/Strength/Tone: WFL for tasks assessed LLE Sensation: WFL - Light Touch;WFL - Proprioception Trunk Assessment Trunk Assessment: Normal     Mobility Bed Mobility Bed Mobility: Supine to Sit Supine to Sit: 7: Independent Sit to Supine: 5: Supervision;HOB flat Details for Bed Mobility Assistance: impulsive movements Transfers Sit to Stand: 4: Min guard;With armrests;From chair/3-in-1 Stand to Sit: 4: Min guard;With armrests;To chair/3-in-1 Details for Transfer Assistance: Min guard A for safety     Shoulder Instructions     Exercise     Balance   End of Session OT - End of Session Equipment Utilized During Treatment: Gait belt Activity Tolerance: Patient tolerated treatment well Patient left: in chair;with call bell/phone within reach;with family/visitor present;with bed alarm set Nurse Communication: Mobility status  GO     Jayd Forrey 05/08/2012, 5:19 PM

## 2012-05-08 NOTE — Clinical Social Work Psychosocial (Signed)
     Clinical Social Work Department BRIEF PSYCHOSOCIAL ASSESSMENT 05/08/2012  Patient:  Jon [name], Jon [name]     Account Number:  [id number]     Admit date:  05/05/2012  Clinical Social Worker:  Margaree Mackintosh  Date/Time:  05/08/2012 12:00 M  Referred by:  Physician  Date Referred:  05/08/2012 Referred for  SNF Placement   Other Referral:   Interview type:  Patient Other interview type:   With family present.    PSYCHOSOCIAL DATA Living Status:  FAMILY Admitted from facility:   Level of care:   Primary support name:  Jon [name] Primary support relationship to patient:  SPOUSE Degree of support available:   Adequate.    CURRENT CONCERNS Current Concerns  Post-Acute Placement   Other Concerns:    SOCIAL WORK ASSESSMENT / PLAN Clinical Social Worker recieved referral for potential SNF. CSW reviewed chart and met with pt at bedside. CSW reviewed dc disposition options.  Pt's first choice is CIR with second choice [facility].  CSW reviewed SNF process.  CSW to begin SNF search.   Assessment/plan status:  Information/Referral to [facility] Other assessment/ plan:   Information/referral to community resources:   SNF  CIR    PATIENTS/FAMILYS RESPONSE TO PLAN OF CARE: Pt was pleasant and engaged in conversation.  Pt thanked CSW for intervention.

## 2012-05-08 NOTE — Progress Notes (Signed)
ANTICOAGULATION CONSULT NOTE - Follow Up Consult  Pharmacy Consult for heparin Indication: recent AVR/ post-op afib  Allergies  Allergen Reactions  . Dilaudid (Hydromorphone Hcl) Other (See Comments)    High doses cause itching  . Penicillins Other (See Comments)    Unknown    Patient Measurements: Height: 5\' 11"  (180.3 cm) Weight: 191 lb 9.3 oz (86.9 kg) IBW/kg (Calculated) : 75.3   Vital Signs: Temp: 97.7 F (36.5 C) (01/02 0900) Temp src: Oral (01/02 0900) BP: 116/70 mmHg (01/02 1101) Pulse Rate: 59  (01/02 1101)  Labs:  Alvira Philips 05/08/12 0420 05/07/12 0655 05/07/12 0045 05/07/12 0037 05/06/12 1852 05/06/12 0550  HGB 9.4* -- 9.6* -- -- --  HCT 29.5* -- 29.7* -- -- 26.2*  PLT 337 -- 301 -- -- 270  APTT -- -- -- -- -- --  LABPROT 18.2* -- 18.6* -- -- 21.7*  INR 1.56* -- 1.61* -- -- 1.98*  HEPARINUNFRC -- -- -- -- -- --  CREATININE 0.86 -- 0.95 -- -- 0.82  CKTOTAL -- -- -- -- 63 --  CKMB -- -- -- -- 3.8 --  TROPONINI -- <0.30 -- <0.30 0.30* --    Estimated Creatinine Clearance: 114.3 ml/min (by C-G formula based on Cr of 0.86).  Assessment: 46yom continues on coumadin for recent AVR/post-op afib. INR is still subtherapeutic. IV heparin has been ordered for bridging. Will not bolus due to recent thoracentesis  Goal of Therapy:  Heparin level 0-3-0.7 INR 2-3 Monitor platelets by anticoagulation protocol: Yes   Plan:   Heparin drip at 1200 units/hr F/u with 6hr heparin level

## 2012-05-08 NOTE — Progress Notes (Signed)
Stroke Team Progress Note  HISTORY Jon [name] is an 47 y.o. male PMHx of aortic stenosis since childhood, mechanical AO heart valve, on chronic coumadin with recent Type A dissection requiring emergent repair. Patient was D/C from the hospital on 12/28. On 12/30 patient walked into his room to change his bandage. As he was getting his pants off he felt nauseated and unwell. His wife left the room to get something he could vomit in and when she returned she found him supine in the bed, right arm behind his head, non responsive, eyes rolled back and diaphoretic. She tried to arouse him and only could get partial response. After about 2-3 minutes he came around. HE does not recall the period of time he was unresponsive. There was NO shaking or jerking notes, NO incontinence. After he was aroused he proceeded to vomit. Patient is currently AOx3 , HE is complaining about having difficulty focusing on objects. He states his vision has been off since surgery. He was seen by opthalmology while hospitalized on 12/26. Their exam was essentially normal with possibility of refraction. Patient states his vision will "come and go" and is most note ably compromised when changing focus from a far object to a near object (and vise versa).  Patient was not a TPA candidate secondary to non focal exam. He was admitted for further evaluation and treatment.  SUBJECTIVE   OBJECTIVE Most recent Vital Signs: Filed Vitals:   05/08/12 0000 05/08/12 0311 05/08/12 0400 05/08/12 1101  BP: 96/59  105/64 116/70  Pulse: 52 53 62 59  Temp: 97.8 F (36.6 C)  97.2 F (36.2 C)   TempSrc:      Resp: 16 16 18    Height:      Weight:      SpO2: 99% 98% 98%    CBG (last 3)  No results found for this basename: GLUCAP:3 in the last 72 hours  IV Fluid Intake:     MEDICATIONS    . ipratropium  0.5 mg Nebulization Q6H   And  . albuterol  2.5 mg Nebulization Q6H  . amiodarone  200 mg Oral BID  . aspirin  81 mg Oral  Daily  . docusate sodium  100 mg Oral BID  . metoprolol tartrate  12.5 mg Oral BID  . pantoprazole  40 mg Oral Daily  . sodium chloride  3 mL Intravenous Q12H  . warfarin  7.5 mg Oral ONCE-1800  . Warfarin - Pharmacist Dosing Inpatient   Does not apply q1800   PRN:  sodium chloride, acetaminophen, acetaminophen, chlorpheniramine-HYDROcodone, oxyCODONE-acetaminophen, RESOURCE THICKENUP CLEAR, sodium chloride  Diet:  Dysphagia 3 thin liquids Activity:  Up with assistance DVT Prophylaxis:  warfarin  CLINICALLY SIGNIFICANT STUDIES Basic Metabolic Panel:  Lab 05/08/12 8119 05/07/12 0045 05/06/12 0550  NA 138 137 --  K 5.1 4.2 --  CL 103 102 --  CO2 29 30 --  GLUCOSE 92 121* --  BUN 14 14 --  CREATININE 0.86 0.95 --  CALCIUM 8.0* 8.1* --  MG -- -- 2.0  PHOS -- -- 4.0   Liver Function Tests:  Lab 05/06/12 0550  AST 36  ALT 248*  ALKPHOS 188*  BILITOT 0.7  PROT 5.0*  ALBUMIN 2.1*   CBC:  Lab 05/08/12 0420 05/07/12 0045  WBC 12.1* 14.9*  NEUTROABS 9.6* 11.9*  HGB 9.4* 9.6*  HCT 29.5* 29.7*  MCV 98.0 97.1  PLT 337 301   Coagulation:  Lab 05/08/12 0420 05/07/12 0045 05/06/12 0550 05/03/12  0451  LABPROT 18.2* 18.6* 21.7* 21.4*  INR 1.56* 1.61* 1.98* 1.94*   Cardiac Enzymes:  Lab 05/07/12 0655 05/07/12 0037 05/06/12 1852  CKTOTAL -- -- 63  CKMB -- -- 3.8  CKMBINDEX -- -- --  TROPONINI <0.30 <0.30 0.30*   Urinalysis: No results found for this basename: COLORURINE:2,APPERANCEUR:2,LABSPEC:2,PHURINE:2,GLUCOSEU:2,HGBUR:2,BILIRUBINUR:2,KETONESUR:2,PROTEINUR:2,UROBILINOGEN:2,NITRITE:2,LEUKOCYTESUR:2 in the last 168 hours Lipid Panel    Component Value Date/Time   CHOL 114 05/08/2012 0420   TRIG 54 05/08/2012 0420   HDL 34* 05/08/2012 0420   CHOLHDL 3.4 05/08/2012 0420   VLDL 11 05/08/2012 0420   LDLCALC 69 05/08/2012 0420   HgbA1C  Lab Results  Component Value Date   HGBA1C 5.0 05/08/2012    Urine Drug Screen:   No results found for this basename: labopia, cocainscrnur,  labbenz, amphetmu, thcu, labbarb    Alcohol Level: No results found for this basename: ETH:2 in the last 168 hours  US Thoracentesis Asp Pleural Space W/img Guide 05/08/2012 Successful ultrasound guided diagnostic and therapeutic right thoracentesis yielding 1.8 liters of pleural fluid.     CT of the brain  04/25/2012 No acute intracranial abnormality.  MRI of the brain  05/06/2012  Definite new punctate infarction within the left cerebellum. Question a few other punctate infarctions in the occipital lobes, though the findings are at the borderline of detectability.  The findings raise the possibility of ongoing posterior circulation micro emboli.    MRA of the brain  05/08/2012  1.  Decreased flow in the distal right vertebral artery in keeping with the neck MRA findings. Flow at the right PICA origin appears preserved. 2.  Otherwise negative posterior circulation, and otherwise negative intracranial MRA.      MRA of the neck 05/08/2012 1.  Intermittently irregular appearance and decreased flow in the right vertebral artery throughout the neck.  The findings in this setting could reflect right vertebral artery thromboembolic disease or vertebral artery dissection.  Neck CTA may characterize further as clinically necessary. 2.  Otherwise negative noncontrast neck MRA. 3.  Intracranial findings are below. 4.  Layering pleural effusions.   2D Echocardiogram  EF 35-40% with no source of embolus. hypokinesis of the entireanteroseptal myocardium. Septal motion showed paradox.  Carotid Doppler  See MRA of the neck  CXR  05/07/2012    1.  Decreased size of right pleural effusion.  No pneumothorax identified. 2.  Persistent bibasilar atelectasis versus infiltrates. 3.  Stable cardiomegaly.    EEG    EKG  normal sinus rhythm, first degree AV block, RBBB, LAFB, lateral TWI, poor R wave progression  Therapy Recommendations   Physical Exam   Pleasant young male currently not in distress.Awake alert. Afebrile.  Head is nontraumatic. Neck is supple without bruit. Hearing is normal. Cardiac exam no murmur or gallop. Lungs are clear to auscultation. Distal pulses are well felt.  Neurological Exam ; Awake  Alert oriented x 3. Normal speech and language.eye movements full without nystagmus.fundi were not visualized. Vision acuity appears normal. Slight decreased blink to threat in the left peripheral field of vision. Face symmetric. Tongue midline. Normal strength, tone, reflexes and coordination. Normal sensation. Gait deferred.  ASSESSMENT Jon [name] is a 47 y.o. male presenting with nausea followed by unresponsiveness. Imaging confirms a new punctate left cerebellum infarct with smaller punctate  occipital lobe infarcts. Infarcts felt to be embolic secondary to post op afib. Work up completed. On warfarin prior to admission for mechanical AO valve. Now on aspirin 81 mg orally every day  and warfarin for secondary stroke prevention. Nothing new to add to treatment.   Cigarette smoker  Hospital day # 3  TREATMENT/PLAN  Continue warfarin for secondary stroke prevention.  Ok for IV heparin given small size of stroke in setting of subtherapeutic INR  Stop smoking  Delia Heady, MD Medical Director [facility] Pager: [phone] 05/08/2012 4:40 PM

## 2012-05-08 NOTE — Progress Notes (Signed)
ANTICOAGULATION CONSULT NOTE - Follow Up Consult  Pharmacy Consult for Coumadin Indication: recent AVR/ post-op afib  Allergies  Allergen Reactions  . Dilaudid (Hydromorphone Hcl) Other (See Comments)    High doses cause itching  . Penicillins Other (See Comments)    Unknown    Patient Measurements: Height: 5\' 11"  (180.3 cm) Weight: 191 lb 9.3 oz (86.9 kg) IBW/kg (Calculated) : 75.3   Vital Signs: Temp: 97.2 F (36.2 C) (01/02 0400) BP: 105/64 mmHg (01/02 0400) Pulse Rate: 62  (01/02 0400)  Labs:  Alvira Philips 05/08/12 0420 05/07/12 0655 05/07/12 0045 05/07/12 0037 05/06/12 1852 05/06/12 0550  HGB 9.4* -- 9.6* -- -- --  HCT 29.5* -- 29.7* -- -- 26.2*  PLT 337 -- 301 -- -- 270  APTT -- -- -- -- -- --  LABPROT 18.2* -- 18.6* -- -- 21.7*  INR 1.56* -- 1.61* -- -- 1.98*  HEPARINUNFRC -- -- -- -- -- --  CREATININE 0.86 -- 0.95 -- -- 0.82  CKTOTAL -- -- -- -- 63 --  CKMB -- -- -- -- 3.8 --  TROPONINI -- <0.30 -- <0.30 0.30* --    Estimated Creatinine Clearance: 114.3 ml/min (by C-G formula based on Cr of 0.86).  Assessment: 46yom continues on coumadin for recent AVR/post-op afib. INR is subtherapeutic and decreased from yesterday despite a higher dose given last night. Unsure why INR is trending down. He is s/p thoracentesis with 1.8 liters of blood-tinged fluid removed on 1/1.  Levaquin is now d/c'ed, continues on amiodarone. Hgb low but stable, platelets ok.  Goal of Therapy:  INR 2-3 Monitor platelets by anticoagulation protocol: Yes   Plan:  1) Repeat coumadin 7.5mg  x 1 2) Follow up INR in AM  Fredrik Rigger 05/08/2012,8:48 AM

## 2012-05-08 NOTE — Progress Notes (Signed)
Patient ID: Jon [name] MRN: [id number] DOB/AGE: 47-09-1965 47 y.o.  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 His dissection extended to the left internal carotid artery. His hospital course was complicated by aspiration pneumonia and visual changes felt to be due to small occipital CVA. He also had post-op atrial fibrillation but converted to NSR on amiodarone therapy before discharge. He was discharged home on 05/03/12 and then readmitted on 05/05/12 after a syncopal episode at home, LOC for 1-2 minutes. He denies any chest pain or palpitations around the event. He notes feeling nauseous and vomiting and then he was told by his wife that he had passed out. He does not recall passing out. EEG today without any conclusive findings. Neurology consult pending. Echo 04/28/12 with mild LV systolic dsyfunction with LVEF 40-45%. EKG with NSR, 1st degree AV block, RBBB, LAFB, lateral TWI, poor R wave progression through precordial leads. No carotid artery dopplers have been performed.     Past Medical History  Diagnosis Date  . Anxiety   . Bicuspid aortic valve   . Aortic stenosis     mechanical AVR 04/22/12  . Shortness of breath   . Dysrhythmia   . Thoracic aortic aneurysm     replacement aortic root 04/22/12  . CVA (cerebral infarction)     occipital CVA 12/13    Family History  Problem Relation Age of Onset  . Lung disease Mother   . Diabetes Father   . Peripheral Artery Disease Father   . Hypertension Father     History   Social History  . Marital Status: Married    Spouse Name: N/A    Number of Children: N/A  . Years of  Education: N/A   Occupational History  . Not on file.   Social History Main Topics  . Smoking status: Current Every Day Smoker -- 2.5 packs/day    Types: Cigarettes  . Smokeless tobacco: Not on file  . Alcohol Use: Yes     Comment: last drink 1.5 years ago, heavy drinker prior  . Drug Use: No     Comment: previous crack cocaine "quit 4 years ago"  . Sexually Active: Yes   Other Topics Concern  . Not on file   Social History Narrative  . No narrative on file    Past Surgical History  Procedure Date  . Cardiac catheterization   . Rotator cuff repair     12 years ago, Left  . Thoracic aortic aneurysm repair 04/22/2012    Procedure: THORACIC ASCENDING ANEURYSM REPAIR (AAA);  Surgeon: [name], MD;  Location[facility] OR;  Service: Open Heart Surgery;  Laterality: N/A;  . Coronary artery bypass graft 04/22/2012    Procedure: CORONARY ARTERY BYPASS GRAFTING (CABG);  Surgeon: [name], MD;  Location: [facility] OR;  Service: Open Heart Surgery;  Laterality: N/A;  times one with right greater saphenous vein graft  . Intraoperative transesophageal echocardiogram 04/22/2012    Procedure: INTRAOPERATIVE TRANSESOPHAGEAL ECHOCARDIOGRAM;  Surgeon: [name], MD;  Location[facility] OR;  Service: Open Heart Surgery;  Laterality: N/A;    Allergies  Allergen Reactions  . Dilaudid (Hydromorphone Hcl) Other (See Comments)  High doses cause itching  . Penicillins Other (See Comments)    Unknown    Current Meds:     . ipratropium  0.5 mg Nebulization Q6H   And  . albuterol  2.5 mg Nebulization Q6H  . amiodarone  200 mg Oral BID  . aspirin  81 mg Oral Daily  . docusate sodium  100 mg Oral BID  . metoprolol tartrate  12.5 mg Oral BID  . pantoprazole  40 mg Oral Daily  . sodium chloride  3 mL Intravenous Q12H  . warfarin  7.5 mg Oral ONCE-1800  . Warfarin - Pharmacist Dosing Inpatient   Does not apply q1800    Review of systems complete and found to be negative  unless listed above    Physical Exam: Blood pressure 116/70, pulse 59, temperature 97.2 F (36.2 C), temperature source Oral, resp. rate 18, height 5\' 11"  (1.803 m), weight 191 lb 9.3 oz (86.9 kg), SpO2 98.00%.    Hoarse voice Lungs: clear Cor:  RR, S1, mechanical S2 Abd. Thin, + BS Ext:  No edema   Labs: INR:1.98   Lab Results  Component Value Date   WBC 12.1* 05/08/2012   HGB 9.4* 05/08/2012   HCT 29.5* 05/08/2012   MCV 98.0 05/08/2012   PLT 337 05/08/2012     Lab 05/08/12 0420 05/06/12 0550  NA 138 --  K 5.1 --  CL 103 --  CO2 29 --  BUN 14 --  CREATININE 0.86 --  CALCIUM 8.0* --  PROT -- 5.0*  BILITOT -- 0.7  ALKPHOS -- 188*  ALT -- 248*  AST -- 36  GLUCOSE 92 --    Radiology: Bilateral pleural effusions, pulmonary edema noted to be improving.   EKG: NSR, 1st degree AV block, RBBB, LAFB, lateral TWI, poor R wave progression through precordial leads.  MRI brain:  Definite new punctate infarction within the left cerebellum.  Question a few other punctate infarctions in the occipital lobes,  though the findings are at the borderline of detectability. The  findings raise the possibility of ongoing posterior circulation  micro emboli.   ASSESSMENT AND PLAN:   1. Syncope:   The MRI of the brain reveals a NEW punctate lesion in the cerebelum compared to the MRI from several days prior.   I suspect this is the cause of his episode of syncope.  His INR is subtheraputic now but was theraputic 12/27 and just fell below 2.0 on 12/28.  This could have a thrombus that formed around the time of his original dissection and dislodged at a later time.  He had transient atrial fibrillation peripoeratively.   Add ASA 81 a day to help minimize thrombus formation on the aortic valve.  Cardiac enzymes are negative - doubt this was due to coronary ischemia.  2. Bradycardia:  He continues to have some bradycardia.  These seem to mainly occur at night.  His metoprolol dose was lowered  yesterday.  He is on amiodarone. I will DC metoprolol and follow.  I doubt this contributed to his episode of syncope.     3.  S/p Mechanical AVR:   His INR is only 1.56 today.  He is not on heparin.  He presented with a new cerebellar lesion that I suspect was an embolus from his new mechanical aortic valve ( or possible due to the extensive surgery involving the aortic valve, proximal aorta) .  I would advise starting IV heparin.  I have spoken to [facility].  She will review the size of the stroke and will start heparin if it is safe.    Signed: Elyn Aquas. 05/08/2012, 12:15 PM

## 2012-05-08 NOTE — Evaluation (Signed)
Physical Therapy Evaluation Patient Details Name: Jon [name] MRN: [id number] DOB: [birth year] Today's Date: 05/08/2012 Time: 8295-6213 PT Time Calculation (min): 39 min  PT Assessment / Plan / Recommendation Clinical Impression  Pt is a 47 y/o male s/p Thoraci ascending anneurysm repair. Pt presents with impaired vision, proprioception and balance. Pt is impulsive and demonstrates poor safety awareness.  Pt would benefit from continued PT to promote safety with mobility, improve balance and safety awareness.  Short term in-patient rehab would be most beneficial setting to continue PT.     PT Assessment  Patient needs continued PT services    Follow Up Recommendations  CIR    Does the patient have the potential to tolerate intense rehabilitation      Barriers to Discharge None      Equipment Recommendations  None recommended by PT    Recommendations for Other Services Rehab consult   Frequency Min 3X/week    Precautions / Restrictions Precautions Precautions: Fall Precaution Comments: Educated pt and spouse extensively in falls precaution, components of balance testing, vestibular and propriception function in balance.  Restrictions Weight Bearing Restrictions: No    Pertinent Vitals/Pain Vital Signs 05/08/12 1515 05/08/12 1518 05/08/12 1625  BP 95/60 mmHg 100/76 mmHg 99/71 mmHg  BP Location Right arm Right arm Right arm  BP Method Automatic Automatic Automatic  Patient Position, if appropriate Lying Sitting Standing        Mobility  Bed Mobility Bed Mobility: Supine to Sit Supine to Sit: 7: Independent Sit to Supine: 5: Supervision;HOB flat Details for Bed Mobility Assistance: impulsive movements Transfers Transfers: Sit to Stand;Stand to Sit Sit to Stand: 4: Min guard;With armrests;From chair/3-in-1 Stand to Sit: 4: Min guard;With armrests;To chair/3-in-1 Details for Transfer Assistance: Min guard A for safety Ambulation/Gait Ambulation/Gait Assistance:  4: Min guard Ambulation Distance (Feet): 100 Feet (100 feet with RW 100 feet with none.) Assistive device: Rolling walker;None Ambulation/Gait Assistance Details: Gait with RW is steady.  Gait with no AD unsteady pt presents with mild lateral sway.  Sway increased with cervical rotation, flexion and extension.  Pt had multiple LOB episodes but able to recover independently   Gait Pattern: Wide base of support;Decreased stride length Stairs: No Wheelchair Mobility Wheelchair Mobility: No    Shoulder Instructions     Exercises     PT Diagnosis: Difficulty walking  PT Problem List: Decreased balance;Decreased mobility;Decreased activity tolerance;Decreased safety awareness;Impaired sensation PT Treatment Interventions: Gait training;DME instruction;Stair training;Functional mobility training;Therapeutic activities;Balance training;Neuromuscular re-education;Patient/family education   PT Goals Acute Rehab PT Goals PT Goal Formulation: With patient Time For Goal Achievement: 05/08/12 Potential to Achieve Goals: Good Pt will go Sit to Stand: with modified independence PT Goal: Sit to Stand - Progress: Goal set today Pt will go Stand to Sit: with modified independence PT Goal: Stand to Sit - Progress: Goal set today Pt will Ambulate: >150 feet;with modified independence;with least restrictive assistive device PT Goal: Ambulate - Progress: Goal set today Pt will Go Up / Down Stairs: 3-5 stairs;with modified independence;with least restrictive assistive device PT Goal: Up/Down Stairs - Progress: Goal set today Additional Goals Additional Goal #1: Pt will perform DGI within safe limits.    PT Goal: Additional Goal #1 - Progress: Goal set today  Visit Information  Last PT Received On: 05/08/12 Assistance Needed: +1    Subjective Data  Subjective: I cant do a lot today.  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  Home Living Lives With:  Spouse Available Help  at Discharge: Family Type of Home: House Home Access: Stairs to enter Secretary/administrator of Steps: 4 Entrance Stairs-Rails: Right;Left Home Layout: One level Bathroom Shower/Tub: Forensic scientist: Standard Bathroom Accessibility: Yes Prior Function Level of Independence: Independent Able to Take Stairs?: Yes Driving: Yes Vocation: Full time employment Comments: Financial trader Communication: No difficulties Dominant Hand: Right    Cognition  Overall Cognitive Status: Appears within functional limits for tasks assessed/performed Arousal/Alertness: Awake/alert Orientation Level: Appears intact for tasks assessed Behavior During Session: [facility] for tasks performed    Extremity/Trunk Assessment Right Upper Extremity Assessment RUE ROM/Strength/Tone: Within functional levels RUE Sensation: WFL - Light Touch RUE Coordination: WFL - fine motor (gross motor deficits) Left Upper Extremity Assessment LUE ROM/Strength/Tone: Within functional levels LUE Sensation: WFL - Light Touch LUE Coordination: WFL - fine motor (gross motor deficits) Right Lower Extremity Assessment RLE ROM/Strength/Tone: WFL for tasks assessed RLE Sensation: WFL - Light Touch;WFL - Proprioception RLE Coordination: WFL - gross/fine motor Left Lower Extremity Assessment LLE ROM/Strength/Tone: WFL for tasks assessed LLE Sensation: WFL - Light Touch;WFL - Proprioception Trunk Assessment Trunk Assessment: Normal   Balance Balance Balance Assessed: Yes Static Standing Balance Static Standing - Balance Support: During functional activity;No upper extremity supported Static Standing - Level of Assistance: 5: Stand by assistance Static Standing - Comment/# of Minutes: 30 seconds static standing with mild A-P sway Tandem Stance - Right Leg: 10  Tandem Stance - Left Leg: 10  Standardized Balance Assessment Standardized Balance Assessment: Dynamic Gait Index Dynamic Gait Index Level  Surface: Mild Impairment Change in Gait Speed: Mild Impairment Gait with Horizontal Head Turns: Moderate Impairment Gait with Vertical Head Turns: Mild Impairment Gait and Pivot Turn: Mild Impairment Step Over Obstacle: Mild Impairment Step Around Obstacles: Mild Impairment Steps: Normal Total Score: 16   End of Session PT - End of Session Equipment Utilized During Treatment: Gait belt Activity Tolerance: Patient tolerated treatment well Patient left: in chair;with call bell/phone within reach;with family/visitor present;Other (comment) (OT with pt) Nurse Communication: Mobility status  GP     Benji Poynter 05/08/2012, 4:36 PM Deserie Dirks L. [facility] DPT [phone]

## 2012-05-08 NOTE — Consult Note (Signed)
WOC consult Note Reason for Consult: evaluate leg wound, s/p surgery for aortic dissection 04/22/12 Wound type: surgical wound that opened after surgery, was dermabond closure Measurement: 1.0cm x 4cm x 0.5cm  Wound bed: some granulation; minimal yellow slough Drainage (amount, consistency, odor) minimal, serous, no odor Periwound:intact  Dressing procedure/placement/frequency:hydrogel every other day, cover with dry dressing. Discussed with pt/family and bedside  Re consult if needed, will not follow at this time. Thanks  Gricelda Foland [employer], CWOCN [phone])

## 2012-05-08 NOTE — Clinical Social Work Placement (Signed)
     Clinical Social Work Department CLINICAL SOCIAL WORK PLACEMENT NOTE 05/08/2012  Patient:  Jon [name], Jon [name]  Account Number:  [id number] Admit date:  05/05/2012  Clinical Social Worker:  Margaree Mackintosh  Date/time:  05/08/2012 12:00 M  Clinical Social Work is seeking post-discharge placement for this patient at the following level of care:   SKILLED NURSING   (*CSW will update this form in Epic as items are completed)   05/08/2012  Patient/family provided with [facility] Department of Clinical Social Works list of facilities offering this level of care within the geographic area requested by the patient (or if unable, by the patients family).  05/08/2012  Patient/family informed of their freedom to choose among providers that offer the needed level of care, that participate in Medicare, Medicaid or managed care program needed by the patient, have an available bed and are willing to accept the patient.  05/08/2012  Patient/family informed of [facility] ownership interest in [facility], as well as of the fact that they are under no obligation to receive care at this facility.  PASARR submitted to EDS on 05/08/2012 PASARR number received from EDS on 05/08/2012  FL2 transmitted to all facilities in geographic area requested by pt/family on  05/08/2012 FL2 transmitted to all facilities within larger geographic area on   Patient informed that his/her managed care company has contracts with or will negotiate with  certain facilities, including the following:     Patient/family informed of bed offers received:   Patient chooses bed at  Physician recommends and patient chooses bed at    Patient to be transferred to  on   Patient to be transferred to facility by   The following physician request were entered in Epic:   Additional Comments:

## 2012-05-08 NOTE — Progress Notes (Signed)
Pt was asleep and had a 4.2 sec pause. VS stable. Pt asymptomatic.Pt's HR SB in the 50's. NP and cardiologist on call made aware. No new orders received at this time. Will cont to monitor pt.

## 2012-05-08 NOTE — Progress Notes (Signed)
Rehab Admissions Coordinator Note:  Patient was screened by Trish Mage for appropriateness for an Inpatient Acute Rehab Consult. Please note that an inpatient rehab consult was completed by Dr. [name] Kill today.  Admission coordinator will follow up in am.  Trish Mage 05/08/2012, 4:51 PM  I can be reached at [phone].

## 2012-05-08 NOTE — Consult Note (Signed)
Physical Medicine and Rehabilitation Consult Reason for Consult: CVA Referring Physician: [facility]   HPI: Jon [name] is a 47 y.o. right-handed male with history of aortic stenosis since childhood, recent type A dissection requiring emergent repair and maintained on chronic Coumadin was recently discharged from the hospital 05/03/2012. Patient was readmitted 05/05/2012 after being found nonresponsive by his wife and diaphoretic. After about 2-3 minutes he came around. He did not recall the period of time he was unresponsive. There was no report of seizure. He was having complaints of difficulty focusing on objects. MRI of the brain showed new punctate infarct within the left cerebellum question a few other punctate infarcts in the occipital lobes. Cardiac enzymes were negative. Echocardiogram showed ejection fraction of 40% with hypokinesis of the entire anteroseptal myocardium. MRA of head and neck and EEG are pending. Bouts of bradycardia into the 30s while sleeping of which remained asymptomatic and followup per cardiology services and metoprolol was adjusted. Patient remains on chronic Coumadin therapy as prior to hospital admission. Hospital course right pleural effusion underwent thoracentesis 05/08/2011 with 1.8 L of blood-tinged fluid removed. Followup chest x-ray with no pneumothorax. Patient continues to have issues in relation to focusing and limited vision he was seen by ophthalmology while hospitalized on 05/01/2012 exam was essentially normal with possibility of refraction. Physical and occupational therapy evaluations are pending. M.D. has requested physical medicine rehabilitation consult to consider inpatient rehabilitation services   Review of Systems  Eyes: Positive for blurred vision and double vision.  Gastrointestinal: Positive for nausea and vomiting.  Neurological: Positive for weakness.  All other systems reviewed and are negative.   Past Medical History  Diagnosis Date    . Anxiety   . Bicuspid aortic valve   . Aortic stenosis     mechanical AVR 04/22/12  . Shortness of breath   . Dysrhythmia   . Thoracic aortic aneurysm     replacement aortic root 04/22/12  . 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 Cardiac catheterization   . Rotator cuff repair     12 years ago, Left  . Thoracic aortic aneurysm repair 04/22/2012    Procedure: THORACIC ASCENDING ANEURYSM REPAIR (AAA);  Surgeon: [name], MD;  Location: [facility] OR;  Service: Open Heart Surgery;  Laterality: N/A;  . Coronary artery bypass graft 04/22/2012    Procedure: CORONARY ARTERY BYPASS GRAFTING (CABG);  Surgeon: [name], MD;  Location: [facility] OR;  Service: Open Heart Surgery;  Laterality: N/A;  times one with right greater saphenous vein graft  . Intraoperative transesophageal echocardiogram 04/22/2012    Procedure: INTRAOPERATIVE TRANSESOPHAGEAL ECHOCARDIOGRAM;  Surgeon: [name], MD;  Location: [facility] OR;  Service: Open Heart Surgery;  Laterality: N/A;   Family History  Problem Relation Age of Onset  . Lung disease Mother   . Diabetes Father   . Peripheral Artery Disease Father   . Hypertension Father    Social History:  reports that he has been smoking Cigarettes.  He has been smoking about 2.5 packs per day. He does not have any smokeless tobacco history on file. He reports that he drinks alcohol. He reports that he does not use illicit drugs. Allergies:  Allergies  Allergen Reactions  . Dilaudid (Hydromorphone Hcl) Other (See Comments)    High doses cause itching  . Penicillins Other (See Comments)    Unknown   Medications Prior to Admission  Medication Sig Dispense  Refill  . amiodarone (PACERONE) 400 MG tablet Take 0.5 tablets (200 mg total) by mouth 2 (two) times daily.  60 tablet  1  . dextromethorphan-guaiFENesin (MUCINEX DM) 30-600 MG per 12 hr tablet Take 1 tablet by mouth 2 (two) times daily as  needed. congestion      . food thickener (RESOURCE THICKENUP CLEAR) POWD Take 1 Can by mouth daily.      Marland Kitchen levofloxacin (LEVAQUIN) 500 MG tablet Take 1 tablet (500 mg total) by mouth daily.  4 tablet  0  . metoprolol tartrate (LOPRESSOR) 25 MG tablet Take 1 tablet (25 mg total) by mouth 2 (two) times daily.  60 tablet  1  . pantoprazole (PROTONIX) 40 MG tablet Take 1 tablet (40 mg total) by mouth daily.  30 tablet  1  . traMADol (ULTRAM) 50 MG tablet Take 50-100 mg by mouth every 6 (six) hours as needed. For pain      . warfarin (COUMADIN) 5 MG tablet Take 1 tablet (5 mg total) by mouth daily at 6 PM.  100 tablet  1  . [DISCONTINUED] dextromethorphan-guaiFENesin (MUCINEX DM) 30-600 MG per 12 hr tablet Take 1 tablet by mouth 2 (two) times daily as needed.  30 tablet  0  . [DISCONTINUED] traMADol (ULTRAM) 50 MG tablet Take 1-2 tablets (50-100 mg total) by mouth every 6 (six) hours as needed.  50 tablet  0    Home:    Functional History:   Functional Status:  Mobility:          ADL:    Cognition: Cognition Orientation Level: Oriented X4    Blood pressure 105/64, pulse 62, temperature 97.2 F (36.2 C), temperature source Oral, resp. rate 18, height 5\' 11"  (1.803 m), weight 86.9 kg (191 lb 9.3 oz), SpO2 98.00%. Physical Exam  Vitals reviewed. Constitutional: He is oriented to person, place, and time.  HENT:  Head: Normocephalic and atraumatic.  Eyes: Conjunctivae normal are normal. Pupils are equal, round, and reactive to light.       Pupils reactive to light without nystagmus  Neck: Neck supple. No thyromegaly present.  Cardiovascular: Normal rate and regular rhythm.        Cardiac rate control  Pulmonary/Chest: Effort normal and breath sounds normal. He has no wheezes.  Abdominal: Soft. Bowel sounds are normal. He exhibits no distension. There is no tenderness.       Incision clean and dressed  Neurological: He is alert and oriented to person, place, and time.       Speech  is mildly dysarthric but intelligible. He does follow simple commands. Noted decreased fine motor skills in RUE. Mild weakness right arm. Gaze dysconjugate. Limited depth perception. No focal visual field cuts. Impulsive. No sensory deficits.   Skin: Skin is warm and dry.  Psychiatric: He has a normal mood and affect. His behavior is normal.    Results for orders placed during the hospital encounter of 05/05/12 (from the past 24 hour(s))  TSH     Status: Abnormal   Collection Time   05/07/12 10:47 AM      Component Value Range   TSH 4.823 (*) 0.350 - 4.500 uIU/mL  BODY FLUID CELL COUNT WITH DIFFERENTIAL     Status: Abnormal   Collection Time   05/07/12 12:21 PM      Component Value Range   Fluid Type-FCT PLEURAL FLUID ADD ON     Color, Fluid BLOODY (*) YELLOW   Appearance, Fluid CLOUDY (*) CLEAR  WBC, Fluid 1064 (*) 0 - 1000 cu mm   Neutrophil Count, Fluid 40 (*) 0 - 25 %   Lymphs, Fluid 41     Monocyte-Macrophage-Serous Fluid 19 (*) 50 - 90 %  PROTIME-INR     Status: Abnormal   Collection Time   05/08/12  4:20 AM      Component Value Range   Prothrombin Time 18.2 (*) 11.6 - 15.2 seconds   INR 1.56 (*) 0.00 - 1.49  CBC WITH DIFFERENTIAL     Status: Abnormal   Collection Time   05/08/12  4:20 AM      Component Value Range   WBC 12.1 (*) 4.0 - 10.5 K/uL   RBC 3.01 (*) 4.22 - 5.81 MIL/uL   Hemoglobin 9.4 (*) 13.0 - 17.0 g/dL   HCT 45.4 (*) 09.8 - 11.9 %   MCV 98.0  78.0 - 100.0 fL   MCH 31.2  26.0 - 34.0 pg   MCHC 31.9  30.0 - 36.0 g/dL   RDW 14.7 (*) 82.9 - 56.2 %   Platelets 337  150 - 400 K/uL   Neutrophils Relative 79 (*) 43 - 77 %   Neutro Abs 9.6 (*) 1.7 - 7.7 K/uL   Lymphocytes Relative 10 (*) 12 - 46 %   Lymphs Abs 1.2  0.7 - 4.0 K/uL   Monocytes Relative 9  3 - 12 %   Monocytes Absolute 1.1 (*) 0.1 - 1.0 K/uL   Eosinophils Relative 1  0 - 5 %   Eosinophils Absolute 0.1  0.0 - 0.7 K/uL   Basophils Relative 0  0 - 1 %   Basophils Absolute 0.0  0.0 - 0.1 K/uL  BASIC  METABOLIC PANEL     Status: Abnormal   Collection Time   05/08/12  4:20 AM      Component Value Range   Sodium 138  135 - 145 mEq/L   Potassium 5.1  3.5 - 5.1 mEq/L   Chloride 103  96 - 112 mEq/L   CO2 29  19 - 32 mEq/L   Glucose, Bld 92  70 - 99 mg/dL   BUN 14  6 - 23 mg/dL   Creatinine, Ser 1.30  0.50 - 1.35 mg/dL   Calcium 8.0 (*) 8.4 - 10.5 mg/dL   GFR calc non Af Amer >90  >90 mL/min   GFR calc Af Amer >90  >90 mL/min  LIPID PANEL     Status: Abnormal   Collection Time   05/08/12  4:20 AM      Component Value Range   Cholesterol 114  0 - 200 mg/dL   Triglycerides 54  <865 mg/dL   HDL 34 (*) >78 mg/dL   Total CHOL/HDL Ratio 3.4     VLDL 11  0 - 40 mg/dL   LDL Cholesterol 69  0 - 99 mg/dL   Dg Chest 1 View  08/11/9627  *RADIOLOGY REPORT*  Clinical Data: Right pleural effusion.  Status post thoracentesis.  CHEST - 1 VIEW  Comparison: 05/06/2012  Findings: Small right pleural effusion has decreased size since previous study.  No evidence of pneumothorax.  There is persistent infiltrate or atelectasis in both lower lobes.  Cardiomegaly is stable.  Previous median sternotomy and aortic valve replacement again noted.  IMPRESSION:  1.  Decreased size of right pleural effusion.  No pneumothorax identified. 2.  Persistent bibasilar atelectasis versus infiltrates. 3.  Stable cardiomegaly.   Original Report Authenticated By: Myles Rosenthal, M.D.  X-ray Chest Pa And Lateral   05/06/2012  *RADIOLOGY REPORT*  Clinical Data: Shortness of breath, fever.  CHEST - 2 VIEW  Comparison: 05/05/2012  Findings: On the improving vascular congestion and interstitial edema pattern.  Bilateral pleural effusions are stable.  Bibasilar atelectasis slightly improved.  Stable cardiomegaly.  Prior valve replacement.  IMPRESSION: Improving interstitial edema pattern and vascular congestion. Continued bibasilar atelectasis and small effusions.   Original Report Authenticated By: Charlett Nose, M.D.    Mr Laqueta Jean Wo  Contrast  05/06/2012  *RADIOLOGY REPORT*  Clinical Data: Recent vascular surgery.  Visual disturbance.  MRI HEAD WITHOUT AND WITH CONTRAST  Technique:  Multiplanar, multiecho pulse sequences of the brain and surrounding structures were obtained according to standard protocol without and with intravenous contrast  Contrast: 17mL MULTIHANCE GADOBENATE DIMEGLUMINE 529 MG/ML IV SOLN  Comparison: Head CT 05/05/2012.  Findings:  Today's study shows a punctate acute infarction within the left cerebellum. Punctate focus of restricted diffusion questioned on the previous study in the right occipital lobe is less evident.  This either represents evolution of a punctate infarction or suggests that the previous finding was artifactual. Elsewhere within the occipital regions, there are two or three punctate foci that are borderline for punctate infarctions.  In any case, the examination suggests that there may be some ongoing micro emboli within the posterior circulation.  No anterior circulation pathology is seen.  No mass lesion, hemorrhage, hydrocephalus or extra-axial collection.  There are  IMPRESSION: Definite new punctate infarction within the left cerebellum. Question a few other punctate infarctions in the occipital lobes, though the findings are at the borderline of detectability.  The findings raise the possibility of ongoing posterior circulation micro emboli.   Original Report Authenticated By: Paulina Fusi, M.D.     Assessment/Plan: Diagnosis: cerebellar and occipital lobe infarcts 1. Does the need for close, 24 hr/day medical supervision in concert with the patient's rehab needs make it unreasonable for this patient to be served in a less intensive setting? Yes 2. Co-Morbidities requiring supervision/potential complications: AS, dissection 3. Due to bladder management, bowel management, safety, skin/wound care, disease management, medication administration, pain management and patient education, does the  patient require 24 hr/day rehab nursing? Yes 4. Does the patient require coordinated care of a physician, rehab nurse, PT (1-2 hrs/day, 5 days/week), OT (1-2 hrs/day, 5 days/week) and SLP (1-2 hrs/day, 5 days/week) to address physical and functional deficits in the context of the above medical diagnosis(es)? Yes Addressing deficits in the following areas: balance, endurance, locomotion, strength, transferring, bowel/bladder control, bathing, dressing, feeding, grooming, toileting, speech, swallowing, psychosocial support and   5. Can the patient actively participate in an intensive therapy program of at least 3 hrs of therapy per day at least 5 days per week? Yes 6. The potential for patient to make measurable gains while on inpatient rehab is excellent 7. Anticipated functional outcomes upon discharge from inpatient rehab are supervision with PT, supervision with OT, mod I with SLP. 8. Estimated rehab length of stay to reach the above functional goals is: likely 7-10 days 9. Does the patient have adequate social supports to accommodate these discharge functional goals? Yes 10. Anticipated D/C setting: Home 11. Anticipated post D/C treatments: HH therapy 12. Overall Rehab/Functional Prognosis: good  RECOMMENDATIONS: This patient's condition is appropriate for continued rehabilitative care in the following setting: [facility] Patient has agreed to participate in recommended program. Yes and Potentially Note that insurance prior authorization may be required for reimbursement for recommended care.  Comment: Spoke at length with patient, family. They all understand the benefit for him in coming to CIR. It would not be a long stay. However, he needs to buy in to the process and realize that there will likely still be visual-spatial,balance deficits which persist after discharge. Rehab RN to follow up.   Ivory Broad, MD     05/08/2012

## 2012-05-08 NOTE — Progress Notes (Signed)
[facility] PROGRESS NOTE  Jon [name] ZOX:[id number] DOB: [birth year] DOA: 05/05/2012 PCP: Renae Fickle, MD  HISTORY 47 yo male with history of aortic stenosis/bicuspid aortic valve admitted 04/22/12 with type 1 aortic dissection requiring emergent surgical correction with replacement of the aortic root, placement of a mechanical aortic valve and single vessel CABG with SVG to RCA. His dissection extended to the left internal carotid artery. His hospital course was complicated by aspiration pneumonia and visual changes felt to be due to small occipital CVA. He also had post-op atrial fibrillation but converted to NSR on amiodarone therapy before discharge. He was discharged home on 05/03/12 and then readmitted on 05/05/12 after a syncopal episode. On 12/30 patient walked into his room to change his bandage. As he was getting his pants off he felt nauseated and unwell. His wife left the room to get something he could vomit in and when she returned she found him supine in the bed, right arm behind his head, non responsive, eyes rolled back and diaphoretic. She tried to arouse him and only could get partial response. After about 2-3 minutes he came around. HE does not recall the period of time he was unresponsive. There was NO shaking or jerking notes, NO incontinence. After he was aroused he proceeded to vomit. Patient is currently AOx3 , HE is complaining about having difficulty focusing on objects. He states his vision has been off since surgery. He was seen by opthalmology while hospitalized on 12/26. Their exam was essentially normal with possibility of refraction. Patient states his vision will "come and go" and is most note ably compromised when changing focus from a far object to a near object (and vise versa).  EEG 05/06/30  showed no epileptiform activity however does show quasirhythmic slow activity in the occipital lobes bilterally. Echo 04/28/12 with mild LV systolic dsyfunction with  LVEF 40-45%. EKG with NSR, 1st degree AV block, RBBB, LAFB, lateral TWI, poor R wave progression through precordial leads.    Assessment/Plan: Syncope  -MRI 05/06/12 showed new punctate infarction of left cerebellum--representing ongoing  Microemboli -Continue ASA. -EEG "borderline EEG could be suggestive of non-specific bilateral occipital lobe dysfunction, however the fact that the finding was only present during drowsiness does call into question the significance of this finding and this could represent a normal variant"  -Appreciate neurology and cardiology input.  -MRA of neck pending.  -2D ECHO 05/07/2012 results as indicated below. -Cycle troponins-->neg  HCAP/Aspiration/Dysphagia  -Speech/swallow evaluation regular consistency with thin liquids. -Discontinue Levaquin--patient has been taking since approximately 04/26/2012 -Mildly elevated WBC likely stress response -Monitor off antibiotics--discontinue vancomycin -Sputum culture shows polymicrobial flora  Aortic stenosis status post mechanical AVR-04/22/12 -Repeat echocardiogram as per cardiology  -Possible contribution to syncope  -Continue warfarin, pharmacy dosing -Started on heparin due to subtherapeutic INR.  Thoracic aortic aneurysm dissection s/p repair 04/22/2012  -Wound care consult for leg wound  -Currently no chest pain or worsening shortness of breath   Postoperative atrial fibrillation -Continue amiodarone -Metoprolol tartrate decreased due to bradycardia  Pleural effusion Status post thoracentesis on 05/07/2012, unfortunately cannot determine fluid was exudative or transudative as no protein was sent.  Deconditioning -PT evaluation, patient prefers going closer home to [place], [place]. -Consult CIR   Family Communication:   Pt at beside Disposition Plan:   ?CIR vs SNF vs home, Patient and family prefer SNF at [place], [place].   Procedures/Studies: 2-D echocardiogram on 05/07/2012  Study Conclusions -  Left ventricle: The cavity size was normal. Wall  thickness was increased in a pattern of mild LVH. Systolic function was moderately reduced. The estimated ejection fraction was in the range of 35% to 40%. There is hypokinesis of the entireanteroseptal myocardium. Left ventricular diastolic function parameters were normal. - Ventricular septum: Septal motion showed paradox. - Aortic valve: A mechanical prosthesis was present. There was mild stenosis. Mild regurgitation. Valve area: 1.01cm^2(VTI). Valve area: 0.95cm^2 (Vmax). - Right atrium: The atrium was moderately dilated. - Atrial septum: No defect or patent foramen ovale was identified. - Tricuspid valve: Mild-moderate regurgitation. - Pulmonary arteries: PA peak pressure: 38mm Hg (S).  Impressions: - No cardiac source of emboli was indentified.     X-ray Chest Pa And Lateral   05/06/2012  *RADIOLOGY REPORT*  Clinical Data: Shortness of breath, fever.  CHEST - 2 VIEW  Comparison: 05/05/2012  Findings: On the improving vascular congestion and interstitial edema pattern.  Bilateral pleural effusions are stable.  Bibasilar atelectasis slightly improved.  Stable cardiomegaly.  Prior valve replacement.  IMPRESSION: Improving interstitial edema pattern and vascular congestion. Continued bibasilar atelectasis and small effusions.   Original Report Authenticated By: Charlett Nose, M.D.    Dg Chest 2 View  05/03/2012  *RADIOLOGY REPORT*  Clinical Data: Chest pain, cough, congestion.  CHEST - 2 VIEW  Comparison: 04/28/2012  Findings: Right lower lobe airspace opacity again noted with decreasing lung volumes.  This could represent atelectasis or worsening infiltrate.  Increasing left base density, likely atelectasis.  Mild cardiomegaly.  Small bilateral pleural effusions.  IMPRESSION: Increasing bibasilar atelectasis or infiltrates.  Decreasing lung volumes.  Small bilateral effusions.   Original Report Authenticated By: Charlett Nose, M.D.    Dg Chest 2  View  04/28/2012  *RADIOLOGY REPORT*  Clinical Data: Post aortic valve replacement, shortness of breath  CHEST - 2 VIEW  Comparison: Portable chest x-ray of 04/27/2012  Findings: Aeration has improved slightly.  There is still a parenchymal opacity in the right mid lung as well as a small right effusion with a smaller left effusion present as well. Cardiomegaly is stable.  No pneumothorax is seen.  IMPRESSION: Slightly better aeration.  Persistent opacity in the right mid lung with small effusions right greater than left.   Original Report Authenticated By: Dwyane Dee, M.D.    Ct Head Wo Contrast  04/25/2012  *RADIOLOGY REPORT*  Clinical Data: Acute frontal headache.  Aortic dissection.  Postop dissection repair  CT HEAD WITHOUT CONTRAST  Technique:  Contiguous axial images were obtained from the base of the skull through the vertex without contrast.  Comparison: CT head 07/07/2003  Findings: Ventricle size is normal.  No acute infarct.  Negative for hemorrhage or mass.  No edema or midline shift.  Calvarium is intact.  Focal area of skin thickening right parietal scalp is unchanged from 2005.  IMPRESSION: No acute intracranial abnormality.   Original Report Authenticated By: Janeece Riggers, M.D.    Mr Brain Wo Contrast  05/02/2012  *RADIOLOGY REPORT*  Clinical Data: Blurred vision.  Previous aortic dissection. Surgical repair.  MRI HEAD WITHOUT CONTRAST  Technique:  Multiplanar, multiecho pulse sequences of the brain and surrounding structures were obtained according to standard protocol without intravenous contrast.  Comparison: Head CT 04/25/2012  Findings: The brainstem and cerebellum are normal.  The cerebral hemispheres are normal except for a questionable 3 mm infarction affecting the surface of the right occipital lobe.  This appearance could be due to artifact.  No mass lesion, acute hemorrhage, hydrocephalus or extra-axial collection.  There is a  punctate focus of hemosiderin in the right frontal  white matter, not likely significant.  No pituitary mass.  No inflammatory sinus disease. Major vessels are patent into the brain.  IMPRESSION: The study is normal, with the exception of a questionable punctate acute/subacute infarction along the surface of the right occipital lobe inferiorly.  This could possibly explain the patient's symptoms.  However, this is an area and appearance that could be artifactual related to artifact frequently seen along the surface of the brain adjacent to dense bone.  Certainly, there is no large, definite or widespread pathology.   Original Report Authenticated By: Paulina Fusi, M.D.    Mr Laqueta Jean Wo Contrast  05/06/2012  *RADIOLOGY REPORT*  Clinical Data: Recent vascular surgery.  Visual disturbance.  MRI HEAD WITHOUT AND WITH CONTRAST  Technique:  Multiplanar, multiecho pulse sequences of the brain and surrounding structures were obtained according to standard protocol without and with intravenous contrast  Contrast: 17mL MULTIHANCE GADOBENATE DIMEGLUMINE 529 MG/ML IV SOLN  Comparison: Head CT 05/05/2012.  Findings:  Today's study shows a punctate acute infarction within the left cerebellum. Punctate focus of restricted diffusion questioned on the previous study in the right occipital lobe is less evident.  This either represents evolution of a punctate infarction or suggests that the previous finding was artifactual. Elsewhere within the occipital regions, there are two or three punctate foci that are borderline for punctate infarctions.  In any case, the examination suggests that there may be some ongoing micro emboli within the posterior circulation.  No anterior circulation pathology is seen.  No mass lesion, hemorrhage, hydrocephalus or extra-axial collection.  There are  IMPRESSION: Definite new punctate infarction within the left cerebellum. Question a few other punctate infarctions in the occipital lobes, though the findings are at the borderline of detectability.  The  findings raise the possibility of ongoing posterior circulation micro emboli.   Original Report Authenticated By: Paulina Fusi, M.D.    Dg Chest Port 1 View  04/27/2012  *RADIOLOGY REPORT*  Clinical Data: Preop  PORTABLE CHEST - 1 VIEW  Comparison: 04/25/2012  Findings: Cardiomegaly again noted.  Status post median sternotomy. Persistent hazy atelectasis or infiltrate right midlung.  No diagnostic pneumothorax.  IMPRESSION: Cardiomegaly.  Status post median sternotomy.  Persistent hazy atelectasis or infiltrates right midlung.   Original Report Authenticated By: Natasha Mead, M.D.    Dg Chest Port 1 View  04/25/2012  *RADIOLOGY REPORT*  Clinical Data: Right-sided atelectasis.  Thoracic aneurysm repair.  PORTABLE CHEST - 1 VIEW  Comparison: 04/24/2012.  Findings: Trachea is midline.  Heart is enlarged, stable.  Right IJ catheter sheath remains in place, as do bilateral chest tubes and/or mediastinal drains and epicardial pacer wires.  Sternotomy wires are stable in position.  Improving right midlung zone airspace opacification.  Lungs are otherwise clear.  No pneumothorax. There may be a tiny right pleural effusion.  IMPRESSION:  1.  Improving, but still unresolved, right midlung zone airspace opacification. 2.  No pneumothorax. 3.  Probable tiny right pleural effusion.   Original Report Authenticated By: Leanna Battles, M.D.    Dg Chest Portable 1 View In Am  04/24/2012  *RADIOLOGY REPORT*  Clinical Data: Postop cardiac surgery for thoracic aortic aneurysm repair.  PORTABLE CHEST - 1 VIEW  Comparison: Radiographs 04/23/2012 and 04/22/2012.  CT 04/21/2012.  Findings: 0653 hours.  Interval extubation with removal of the nasogastric tube and Swan-Ganz catheter.  Right IJ sheath and chest tubes remain in place.  Heart size and  mediastinal contours are stable.  There is overall improving aeration of the right upper lobe which demonstrated partial collapse previously.  Residual focal perihilar airspace disease  remains on the right.  The left lung is clear.  There is no pneumothorax.  IMPRESSION:  1.  Interval partial re-expansion of the right upper lobe.  There is persistent right perihilar opacity which was not present preoperatively and may reflect atelectasis or pneumonia. 2.  No pneumothorax.   Original Report Authenticated By: Carey Bullocks, M.D.    Dg Chest Portable 1 View In Am  04/23/2012  *RADIOLOGY REPORT*  Clinical Data: Thoracic aortic aneurysm repair.  PORTABLE CHEST - 1 VIEW  Comparison: 04/22/2012  Findings: Previously described support apparatus is stable and remains well positioned.  Volume loss from right upper lobe atelectasis is unchanged.  There is mild right lung base opacity also likely atelectasis with an associated small effusion.  The lungs are otherwise clear.  No pneumothorax.  No mediastinal widening.  IMPRESSION: No change from the previous day's study.  Support apparatus is stable well-positioned.   Original Report Authenticated By: Amie Portland, M.D.    Dg Chest Portable 1 View  04/22/2012  *RADIOLOGY REPORT*  Clinical Data: Status post CABG.  PORTABLE CHEST - 1 VIEW  Comparison: Single view of the chest 04/21/2012.  Findings: Endotracheal tube is in place with the tip in good position, well above the carina.  Right IJ approach Swan-Ganz catheter tip is in the proximal most right main pulmonary artery. Chest tubes are noted. There is a small right pleural effusion.  No pneumothorax is seen.  Atelectasis right midlung noted.  IMPRESSION: Support apparatus in good position.  No pneumothorax.  Small right effusion and scattered atelectasis.   Original Report Authenticated By: Holley Dexter, M.D.    Dg Swallowing Func-speech Pathology  05/01/2012  Carolan Shiver, CCC-SLP     05/01/2012  2:24 PM Objective Swallowing Evaluation: Modified Barium Swallowing Study   Patient Details  Name: Twain Stenseth MRN: [id number] Date of Birth: 12-Jun-1965  Today's Date: 05/01/2012  Time: 1210-1240 SLP Time Calculation (min): 30 min  Past Medical History:  Past Medical History  Diagnosis Date  . Anxiety    Past Surgical History:  Past Surgical History  Procedure Date  . Cardiac catheterization   . Rotator cuff repair     12 years ago, Left  . Thoracic aortic aneurysm repair 04/22/2012    Procedure: THORACIC ASCENDING ANEURYSM REPAIR (AAA);  Surgeon:  [name], MD;  Location: [facility] OR;  Service: Open Heart  Surgery;  Laterality: N/A;  . Coronary artery bypass graft 04/22/2012    Procedure: CORONARY ARTERY BYPASS GRAFTING (CABG);  Surgeon:  [name], MD;  Location: [facility] OR;  Service: Open Heart  Surgery;  Laterality: N/A;  times one with right greater  saphenous vein graft  . Intraoperative transesophageal echocardiogram 04/22/2012    Procedure: INTRAOPERATIVE TRANSESOPHAGEAL ECHOCARDIOGRAM;   Surgeon: [name], MD;  Location: [facility] OR;  Service:  Open Heart Surgery;  Laterality: N/A;   HPI:  47 y/o male with history of aortic stenosis since childhood who  developed sudden onset of severe CP.  Patient went to [facility] ED  where CT angiogram showed a Type A  dissection.  Patient  transferred to [facility] for Thoracic ascending aneurysm repair, CABG  and Intraopertive Transesophageal Echocardiogram.  CXR indicates  Right lung infliltrate . Pt has been followed by SLP to address  dysphagia s/p surgery.  Pt has  not been tolerating thin liquids,  with consistent s/s aspiration.  Plan was for FEES today, but due  to instrument malfunction, MBS was ordered as an alternative.        Assessment / Plan / Recommendation Clinical Impression  Dysphagia Diagnosis: Mild pharyngeal phase dysphagia Clinical impression: Pt presents with a pharyngeal dysphagia,  isolated to inadequate laryngeal closure during the pharyngeal  phase.  This impairment leads to consistent aspiration of thin  liquids, accompanied by a nonprotective cough response.  Postural  adjustments (head turn to left and right)  increased aspiration; a  chin tuck resulted in liquids penetrating the larynx but not  descending below the vocal cords.  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  After lengthy  discussion with pt and spouse, rec: nectar-thick liquids OR thin  liquids with use of a chin-tuck; meds whole with puree; SLP f/u  for swallowing therapy after D/C from hospital. Spouse is in  agreement; pt with impaired insight into circumstances.  SLP will  follow here until D/C.    Treatment Recommendation  Therapy as outlined in treatment plan below    Diet Recommendation Regular;Nectar-thick liquid (and thin with  chin tuck)   Liquid Administration via: Cup Medication Administration: Whole meds with puree Supervision: Patient able to self feed;Intermittent supervision  to cue for compensatory strategies Compensations: Slow rate;Small sips/bites Postural Changes and/or Swallow Maneuvers: Chin tuck    Other  Recommendations Oral Care Recommendations: Oral care BID   Follow Up Recommendations       Frequency and Duration min 2x/week  2 weeks   Pertinent Vitals/Pain none    SLP Swallow Goals Patient will consume recommended diet without observed clinical  signs of aspiration with: Supervision/safety Swallow Study Goal #1 - Progress: Progressing toward goal Patient will utilize recommended strategies during swallow to  increase swallowing safety with: Supervision/safety Swallow Study Goal #2 - Progress: Progressing toward goal   General Date of Onset: 04/22/12 HPI: 47 y/o male with history of aortic stenosis since childhood  who developed sudden onset of severe CP.  Patient went to  [facility] ED where CT angiogram showed a Type A  dissection.   Patient transferred to [facility] for Thoracic ascending aneurysm repair,  CABG and Intraopertive Transesophageal Echocardiogram.  CXR  indicates Right lung infliltrate . Pt has been followed by SLP to  address dysphagia s/p surgery.  Pt has not been tolerating thin  liquids, with  consistent s/s aspiration.  Plan was for FEES  today, but due to insturment malfunction, MBS was ordered as an  alternative.    Type of Study: Modified Barium Swallowing Study Reason for Referral: Objectively evaluate swallowing function Diet Prior to this Study: Regular;Nectar-thick liquids Temperature Spikes Noted: No Respiratory Status: Room air History of Recent Intubation: Yes Behavior/Cognition: Alert;Impulsive;Decreased sustained attention Oral Cavity - Dentition: Adequate natural dentition Oral Motor / Sensory Function: Within functional limits Self-Feeding Abilities: Able to feed self Patient Positioning: Upright in chair Baseline Vocal Quality: Hoarse;Low vocal intensity Volitional Cough: Weak Volitional Swallow: Able to elicit Anatomy: Within functional limits Pharyngeal Secretions: Not observed secondary MBS    Reason for Referral Objectively evaluate swallowing function   Oral Phase Oral Preparation/Oral Phase Oral Phase: WFL   Pharyngeal Phase Pharyngeal Phase Pharyngeal Phase: Impaired Pharyngeal - Thin Pharyngeal - Thin Cup: Reduced airway/laryngeal  closure;Penetration/Aspiration during swallow;Moderate  aspiration;Compensatory strategies attempted (Comment) (head turn  left and right; mendelsohn maneuver, chin tuck) Penetration/Aspiration details (thin cup): Material enters  airway, passes  BELOW cords and not ejected out despite cough  attempt by patient  Cervical Esophageal Phase    GO   Amanda L. Catlett, [place] CCC/SLP Pager [phone]  Cervical Esophageal Phase Cervical Esophageal Phase: Leonarda Salon         Blenda Mounts Laurice 05/01/2012, 2:22 PM       Subjective: Patient wondering about discharge home.  Objective: Filed Vitals:   05/08/12 0000 05/08/12 0311 05/08/12 0400 05/08/12 1101  BP: 96/59  105/64 116/70  Pulse: 52 53 62 59  Temp: 97.8 F (36.6 C)  97.2 F (36.2 C)   TempSrc:      Resp: 16 16 18    Height:      Weight:      SpO2: 99% 98% 98%     Intake/Output Summary (Last 24  hours) at 05/08/12 1126 Last data filed at 05/08/12 0600  Gross per 24 hour  Intake    480 ml  Output   1025 ml  Net   -545 ml   Weight change:  Exam: Physical Exam: General: Awake, Oriented, No acute distress. HEENT: EOMI. Neck: Supple CV: S1 and S2 Lungs: Crackles at bases bilaterally. Abdomen: Soft, Nontender, Nondistended, +bowel sounds. Ext: Good pulses. Trace edema.  Data Reviewed: Basic Metabolic Panel:  Lab 05/08/12 4540 05/07/12 0045 05/06/12 0550 05/02/12 0522  NA 138 137 134* 129*  K 5.1 4.2 4.4 3.6  CL 103 102 99 92*  CO2 29 30 28 30   GLUCOSE 92 121* 111* 121*  BUN 14 14 12 13   CREATININE 0.86 0.95 0.82 0.89  CALCIUM 8.0* 8.1* 7.8* 7.7*  MG -- -- 2.0 --  PHOS -- -- 4.0 --   Liver Function Tests:  Lab 05/06/12 0550  AST 36  ALT 248*  ALKPHOS 188*  BILITOT 0.7  PROT 5.0*  ALBUMIN 2.1*   No results found for this basename: LIPASE:5,AMYLASE:5 in the last 168 hours No results found for this basename: AMMONIA:5 in the last 168 hours CBC:  Lab 05/08/12 0420 05/07/12 0045 05/06/12 0550 05/02/12 0522  WBC 12.1* 14.9* 15.1* 19.8*  NEUTROABS 9.6* 11.9* 12.7* --  HGB 9.4* 9.6* 8.5* 9.6*  HCT 29.5* 29.7* 26.2* 28.4*  MCV 98.0 97.1 95.6 94.0  PLT 337 301 270 237   Cardiac Enzymes:  Lab 05/07/12 0655 05/07/12 0037 05/06/12 1852  CKTOTAL -- -- 63  CKMB -- -- 3.8  CKMBINDEX -- -- --  TROPONINI <0.30 <0.30 0.30*   BNP: No components found with this basename: POCBNP:5 CBG: No results found for this basename: GLUCAP:5 in the last 168 hours  Recent Results (from the past 240 hour(s))  MRSA PCR SCREENING     Status: Normal   Collection Time   05/05/12 11:11 PM      Component Value Range Status Comment   MRSA by PCR NEGATIVE  NEGATIVE Final   CULTURE, EXPECTORATED SPUTUM-ASSESSMENT     Status: Normal   Collection Time   05/06/12  1:37 AM      Component Value Range Status Comment   Specimen Description SPUTUM   Final    Special Requests Normal    Final    Sputum evaluation     Final    Value: THIS SPECIMEN IS ACCEPTABLE. RESPIRATORY CULTURE REPORT TO FOLLOW.   Report Status 05/06/2012 FINAL   Final   CULTURE, RESPIRATORY     Status: Normal   Collection Time   05/06/12  1:37 AM      Component Value Range Status Comment  Specimen Description SPUTUM   Final    Special Requests NONE   Final    Gram Stain     Final    Value: MODERATE WBC PRESENT,BOTH PMN AND MONONUCLEAR     MODERATE SQUAMOUS EPITHELIAL CELLS PRESENT     FEW GRAM POSITIVE COCCI IN CLUSTERS     IN PAIRS RARE GRAM NEGATIVE RODS     RARE GRAM POSITIVE RODS   Culture NORMAL OROPHARYNGEAL FLORA   Final    Report Status 05/08/2012 FINAL   Final      Scheduled Meds:    . ipratropium  0.5 mg Nebulization Q6H   And  . albuterol  2.5 mg Nebulization Q6H  . amiodarone  200 mg Oral BID  . aspirin  81 mg Oral Daily  . docusate sodium  100 mg Oral BID  . metoprolol tartrate  12.5 mg Oral BID  . pantoprazole  40 mg Oral Daily  . sodium chloride  3 mL Intravenous Q12H  . warfarin  7.5 mg Oral ONCE-1800  . Warfarin - Pharmacist Dosing Inpatient   Does not apply q1800   Continuous Infusions:    Percival Glasheen A, MD  [facility] Pager [phone]  If 7PM-7AM, please contact night-coverage [email or web address] Password [email or web address] 05/08/2012, 11:26 AM   LOS: 3 days

## 2012-05-09 ENCOUNTER — Inpatient Hospital Stay (HOSPITAL_COMMUNITY)
Admission: AD | Admit: 2012-05-09 | Discharge: 2012-05-15 | DRG: 945 | Disposition: A | Discharge status: Home / Self Care | Payer: Medicaid Other | Source: Ambulatory Visit | Attending: Physical Medicine & Rehabilitation | Admitting: Physical Medicine & Rehabilitation

## 2012-05-09 DIAGNOSIS — I10 Essential (primary) hypertension: Secondary | ICD-10-CM

## 2012-05-09 DIAGNOSIS — H532 Diplopia: Secondary | ICD-10-CM

## 2012-05-09 DIAGNOSIS — Z5189 Encounter for other specified aftercare: Principal | ICD-10-CM

## 2012-05-09 DIAGNOSIS — I6340 Cerebral infarction due to embolism of unspecified cerebral artery: Secondary | ICD-10-CM

## 2012-05-09 DIAGNOSIS — IMO0002 Reserved for concepts with insufficient information to code with codable children: Secondary | ICD-10-CM

## 2012-05-09 DIAGNOSIS — Z954 Presence of other heart-valve replacement: Secondary | ICD-10-CM

## 2012-05-09 DIAGNOSIS — Z79899 Other long term (current) drug therapy: Secondary | ICD-10-CM

## 2012-05-09 DIAGNOSIS — R05 Cough: Secondary | ICD-10-CM

## 2012-05-09 DIAGNOSIS — Y838 Other surgical procedures as the cause of abnormal reaction of the patient, or of later complication, without mention of misadventure at the time of the procedure: Secondary | ICD-10-CM

## 2012-05-09 DIAGNOSIS — I639 Cerebral infarction, unspecified: Secondary | ICD-10-CM

## 2012-05-09 DIAGNOSIS — J90 Pleural effusion, not elsewhere classified: Secondary | ICD-10-CM

## 2012-05-09 DIAGNOSIS — Z7901 Long term (current) use of anticoagulants: Secondary | ICD-10-CM

## 2012-05-09 DIAGNOSIS — R059 Cough, unspecified: Secondary | ICD-10-CM

## 2012-05-09 DIAGNOSIS — Z951 Presence of aortocoronary bypass graft: Secondary | ICD-10-CM

## 2012-05-09 DIAGNOSIS — R350 Frequency of micturition: Secondary | ICD-10-CM

## 2012-05-09 DIAGNOSIS — F17200 Nicotine dependence, unspecified, uncomplicated: Secondary | ICD-10-CM

## 2012-05-09 LAB — HEPARIN LEVEL (UNFRACTIONATED): Heparin Unfractionated: 0.41 IU/mL (ref 0.30–0.70)

## 2012-05-09 LAB — CBC
Hemoglobin: 9.4 g/dL — ABNORMAL LOW (ref 13.0–17.0)
MCH: 30.9 pg (ref 26.0–34.0)
MCHC: 32.2 g/dL (ref 30.0–36.0)
MCV: 96.1 fL (ref 78.0–100.0)
Platelets: 330 10*3/uL (ref 150–400)
RBC: 3.04 MIL/uL — ABNORMAL LOW (ref 4.22–5.81)

## 2012-05-09 LAB — BASIC METABOLIC PANEL
GFR calc non Af Amer: >90 mL/min (ref >90)
Glucose, Bld: 93 mg/dL (ref 70–99)
Potassium: 4.5 mEq/L (ref 3.5–5.1)
Sodium: 137 mEq/L (ref 135–145)

## 2012-05-09 MED ORDER — HEPARIN (PORCINE) IN NACL 100-0.45 UNIT/ML-% IJ SOLN
1900.0000 [IU]/h | INTRAMUSCULAR | Status: DC
  Administered 2012-05-10: 1900 [IU]/h via INTRAVENOUS
  Filled 2012-05-09 (×5): qty 250

## 2012-05-09 MED ORDER — ASPIRIN 81 MG PO CHEW
81.0000 mg | CHEWABLE_TABLET | Freq: Every day | ORAL | Status: DC
  Administered 2012-05-10: 81 mg via ORAL
  Filled 2012-05-09 (×3): qty 1

## 2012-05-09 MED ORDER — POLYETHYLENE GLYCOL 3350 17 G PO PACK
17.0000 g | PACK | Freq: Every day | ORAL | Status: DC | PRN
  Filled 2012-05-09: qty 1

## 2012-05-09 MED ORDER — WARFARIN SODIUM 7.5 MG PO TABS
7.5000 mg | ORAL_TABLET | Freq: Once | ORAL | Status: DC
  Filled 2012-05-09: qty 1

## 2012-05-09 MED ORDER — WARFARIN - PHARMACIST DOSING INPATIENT: Freq: Every day | Status: DC

## 2012-05-09 MED ORDER — ACETAMINOPHEN 325 MG PO TABS
325.0000 mg | ORAL_TABLET | ORAL | Status: DC | PRN
  Administered 2012-05-09 – 2012-05-14 (×7): 650 mg via ORAL
  Administered 2012-05-14: 325 mg via ORAL
  Administered 2012-05-14 – 2012-05-15 (×3): 650 mg via ORAL
  Filled 2012-05-09 (×11): qty 2

## 2012-05-09 MED ORDER — WARFARIN SODIUM 7.5 MG PO TABS
7.5000 mg | ORAL_TABLET | Freq: Once | ORAL | Status: AC
  Administered 2012-05-09: 7.5 mg via ORAL
  Filled 2012-05-09: qty 1

## 2012-05-09 MED ORDER — ONDANSETRON HCL 4 MG PO TABS: 4.0000 mg | ORAL_TABLET | Freq: Four times a day (QID) | ORAL | Status: DC | PRN

## 2012-05-09 MED ORDER — PANTOPRAZOLE SODIUM 40 MG PO TBEC
40.0000 mg | DELAYED_RELEASE_TABLET | Freq: Every day | ORAL | Status: DC
  Administered 2012-05-10 – 2012-05-15 (×6): 40 mg via ORAL
  Filled 2012-05-09 (×7): qty 1

## 2012-05-09 MED ORDER — OXYCODONE-ACETAMINOPHEN 5-325 MG PO TABS
1.0000 | ORAL_TABLET | ORAL | Status: DC | PRN
  Administered 2012-05-11 (×2): 1 via ORAL
  Filled 2012-05-09 (×2): qty 1

## 2012-05-09 MED ORDER — AMIODARONE HCL 200 MG PO TABS
200.0000 mg | ORAL_TABLET | Freq: Two times a day (BID) | ORAL | Status: DC
  Administered 2012-05-09 – 2012-05-15 (×12): 200 mg via ORAL
  Filled 2012-05-09 (×14): qty 1

## 2012-05-09 MED ORDER — SORBITOL 70 % SOLN: 30.0000 mL | Freq: Every day | Status: DC | PRN

## 2012-05-09 MED ORDER — HYDROCOD POLST-CHLORPHEN POLST 10-8 MG/5ML PO LQCR: 5.0000 mL | Freq: Two times a day (BID) | ORAL | Status: DC | PRN

## 2012-05-09 MED ORDER — ONDANSETRON HCL 4 MG/2ML IJ SOLN: 4.0000 mg | Freq: Four times a day (QID) | INTRAMUSCULAR | Status: DC | PRN

## 2012-05-09 NOTE — Progress Notes (Addendum)
Patient ID: Jabriel Vanduyne MRN: [id number] DOB/AGE: 08-Apr-1966 47 y.o.  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 His dissection extended to the left internal carotid artery. His hospital course was complicated by aspiration pneumonia and visual changes felt to be due to small occipital CVA. He also had post-op atrial fibrillation but converted to NSR on amiodarone therapy before discharge. He was discharged home on 05/03/12 and then readmitted on 05/05/12 after a syncopal episode at home, LOC for 1-2 minutes. He denies any chest pain or palpitations around the event. He notes feeling nauseous and vomiting and then he was told by his wife that he had passed out. He does not recall passing out. EEG today without any conclusive findings. Neurology consult pending. Echo 04/28/12 with mild LV systolic dsyfunction with LVEF 40-45%. EKG with NSR, 1st degree AV block, RBBB, LAFB, lateral TWI, poor R wave progression through precordial leads. No carotid artery dopplers have been performed.     Past Medical History  Diagnosis Date  . Anxiety   . Bicuspid aortic valve   . Aortic stenosis     mechanical AVR 04/22/12  . Shortness of breath   . Dysrhythmia   . Thoracic aortic aneurysm     replacement aortic root 04/22/12  . CVA (cerebral infarction)     occipital CVA 12/13    Family History  Problem Relation Age of Onset  . Lung disease Mother   . Diabetes Father   . Peripheral Artery Disease Father   . Hypertension Father     History   Social History  . Marital Status: Married    Spouse Name: N/A    Number of Children: N/A  . Years of  Education: N/A   Occupational History  . Not on file.   Social History Main Topics  . Smoking status: Current Every Day Smoker -- 2.5 packs/day    Types: Cigarettes  . Smokeless tobacco: Not on file  . Alcohol Use: Yes     Comment: last drink 1.5 years ago, heavy drinker prior  . Drug Use: No     Comment: previous crack cocaine "quit 4 years ago"  . Sexually Active: Yes   Other Topics Concern  . Not on file   Social History Narrative  . No narrative on file    Past Surgical History  Procedure Date  . Cardiac catheterization   . Rotator cuff repair     12 years ago, Left  . Thoracic aortic aneurysm repair 04/22/2012    Procedure: THORACIC ASCENDING ANEURYSM REPAIR (AAA);  Surgeon: [name], MD;  Location: [facility] OR;  Service: Open Heart Surgery;  Laterality: N/A;  . Coronary artery bypass graft 04/22/2012    Procedure: CORONARY ARTERY BYPASS GRAFTING (CABG);  Surgeon: [name], MD;  Location: [facility] OR;  Service: Open Heart Surgery;  Laterality: N/A;  times one with right greater saphenous vein graft  . Intraoperative transesophageal echocardiogram 04/22/2012    Procedure: INTRAOPERATIVE TRANSESOPHAGEAL ECHOCARDIOGRAM;  Surgeon: [name], MD;  Location: [facility] OR;  Service: Open Heart Surgery;  Laterality: N/A;    Allergies  Allergen Reactions  . Dilaudid (Hydromorphone Hcl) Other (See Comments)  High doses cause itching  . Penicillins Other (See Comments)    Unknown    Current Meds:     . amiodarone  200 mg Oral BID  . aspirin  81 mg Oral Daily  . docusate sodium  100 mg Oral BID  . pantoprazole  40 mg Oral Daily  . sodium chloride  3 mL Intravenous Q12H  . Warfarin - Pharmacist Dosing Inpatient   Does not apply q1800    Review of systems complete and found to be negative unless listed above    Physical Exam: Blood pressure 118/70, pulse 73, temperature 97.4 F (36.3 C), temperature source Oral, resp. rate 20, height 5\' 11"  (1.803 m),  weight 186 lb 1.6 oz (84.414 kg), SpO2 94.00%.    Hoarse voice Lungs: clear Cor:  RR, S1, mechanical S2 Abd. Thin, + BS Ext:  No edema   Labs: INR:1.98   Lab Results  Component Value Date   WBC 10.8* 05/09/2012   HGB 9.4* 05/09/2012   HCT 29.2* 05/09/2012   MCV 96.1 05/09/2012   PLT 330 05/09/2012     Lab 05/09/12 0718 05/06/12 0550  NA 137 --  K 4.5 --  CL 102 --  CO2 28 --  BUN 10 --  CREATININE 0.69 --  CALCIUM 8.1* --  PROT -- 5.0*  BILITOT -- 0.7  ALKPHOS -- 188*  ALT -- 248*  AST -- 36  GLUCOSE 93 --    Radiology: Bilateral pleural effusions, pulmonary edema noted to be improving.   EKG: NSR, 1st degree AV block, RBBB, LAFB, lateral TWI, poor R wave progression through precordial leads.  MRI brain:  Definite new punctate infarction within the left cerebellum.  Question a few other punctate infarctions in the occipital lobes,  though the findings are at the borderline of detectability. The  findings raise the possibility of ongoing posterior circulation  micro emboli.   ASSESSMENT AND PLAN:   1. Syncope:   Likely due to his new cerebellar CVA.  He has had some bradycardia but probably not significant enough to cause his episode.   Cardiac enzymes are negative - doubt this was due to coronary ischemia.  2. Bradycardia:  He continues to have some bradycardia.  These seem to mainly occur at night.  His metoprolol dose was lowered yesterday.  He is on amiodarone for post op A-fib. I will DC metoprolol and follow.  I doubt this contributed to his episode of syncope.   If he remains in NSR for the next month or so, I would DC Amiodarone and restart low dose Metoprolol - which would help reduce shear stress and may prevent another spontaneous dissection.     3.  S/p Mechanical AVR:     He is on heparin and coumadin.  He will need to stay on Heparin until his coumadin is therapeutic ( INR 2.0-3.0) .  He presented with a new cerebellar lesion that I suspect was an embolus  from his new mechanical aortic valve ( or possible due to the extensive surgery involving the aortic valve, proximal aorta).  His INR had been therapeutic at the time of DC but had drifted slightly below 2.0 upon presentation.  I doubt that brief amount of time was sufficient to cause a thrombus formation.  I think it was more likely a platelet issue.    ASA 81 mg has been added.  He is scheduled to go to rehab.  He should be stable to go.  He would like  to follow up with cardiology in [place].   We will sign off. Call for questions.    Signed: Elyn Aquas. 05/09/2012, 10:11 AM

## 2012-05-09 NOTE — H&P (View-Only) (Signed)
Physical Medicine and Rehabilitation Admission H&P    No chief complaint on file. : HPI: Jon [name] is a 47 y.o. 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 Patient was readmitted 05/05/2012 after being found nonresponsive by his wife and diaphoretic. After about 2-3 minutes he came around. He did not recall the period of time he was unresponsive. There was no report of seizure. He was having complaints of difficulty focusing on objects. MRI of the brain showed new punctate infarct within the left cerebellum question a few other punctate infarcts in the occipital lobes. Cardiac enzymes were negative. Echocardiogram showed ejection fraction of 40% with hypokinesis of the entire anteroseptal myocardium. MRA of head and neck were unremarkable. EEG was pending. Bouts of bradycardia into the 30s while sleeping of which remained asymptomatic and followup per cardiology services and metoprolol was discontinued and remains on amiodarone. Patient remains on chronic Coumadin therapy as prior to hospital admission with intravenous heparin added until INR greater than 2.00 with latest INR of 1.66. Hospital course right pleural effusion underwent thoracentesis 05/08/2011 with 1.8 L of blood-tinged fluid removed. Followup chest x-ray with no pneumothorax. Patient continues to have issues in relation to focusing and limited vision as he was seen by ophthalmology while hospitalized on 05/01/2012 exam was essentially normal with possibility of refraction. Physical and occupational therapy evaluations completed an ongoing. Noted issues in regards to being impulsive and limited awareness of deficits. M.D./PT/OT have requested physical medicine rehabilitation consult to consider inpatient rehabilitation services. Patient was felt to be a good  candidate for inpatient rehabilitation services was admitted for a comprehensive rehabilitation program  Review of Systems  Eyes: Positive for blurred vision and double vision.  Gastrointestinal: Positive for nausea and vomiting.  Neurological: Positive for weakness.  All other systems reviewed and are negative   Past Medical History  Diagnosis Date  . Anxiety   . Bicuspid aortic valve   . Aortic stenosis     mechanical AVR 04/22/12  . Shortness of breath   . Dysrhythmia   . Thoracic aortic aneurysm     replacement aortic root 04/22/12  . 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 Cardiac catheterization   . Rotator cuff repair     12 years ago, Left  . Thoracic aortic aneurysm repair 04/22/2012    Procedure: THORACIC ASCENDING ANEURYSM REPAIR (AAA);  Surgeon: [name], MD;  Location: [facility] OR;  Service: Open Heart Surgery;  Laterality: N/A;  . Coronary artery bypass graft 04/22/2012    Procedure: CORONARY ARTERY BYPASS GRAFTING (CABG);  Surgeon: [name], MD;  Location: [facility] OR;  Service: Open Heart Surgery;  Laterality: N/A;  times one with right greater saphenous vein graft  . Intraoperative transesophageal echocardiogram 04/22/2012    Procedure: INTRAOPERATIVE TRANSESOPHAGEAL ECHOCARDIOGRAM;  Surgeon: [name], MD;  Location: [facility] OR;  Service: Open Heart Surgery;  Laterality: N/A;   Family History  Problem Relation Age of Onset  . Lung disease Mother   . Diabetes Father   . Peripheral Artery Disease Father   . Hypertension Father    Social History:  reports that he has been smoking Cigarettes.  He has been smoking about 2.5 packs per day. He does not have any smokeless tobacco history on file. He reports that he  drinks alcohol. He reports that he does not use illicit drugs. Allergies:  Allergies  Allergen Reactions  . Dilaudid (Hydromorphone Hcl) Other (See Comments)    High doses cause  itching  . Penicillins Other (See Comments)    Unknown   Medications Prior to Admission  Medication Sig Dispense Refill  . amiodarone (PACERONE) 400 MG tablet Take 0.5 tablets (200 mg total) by mouth 2 (two) times daily.  60 tablet  1  . dextromethorphan-guaiFENesin (MUCINEX DM) 30-600 MG per 12 hr tablet Take 1 tablet by mouth 2 (two) times daily as needed. congestion      . food thickener (RESOURCE THICKENUP CLEAR) POWD Take 1 Can by mouth daily.      Marland Kitchen levofloxacin (LEVAQUIN) 500 MG tablet Take 1 tablet (500 mg total) by mouth daily.  4 tablet  0  . metoprolol tartrate (LOPRESSOR) 25 MG tablet Take 1 tablet (25 mg total) by mouth 2 (two) times daily.  60 tablet  1  . pantoprazole (PROTONIX) 40 MG tablet Take 1 tablet (40 mg total) by mouth daily.  30 tablet  1  . traMADol (ULTRAM) 50 MG tablet Take 50-100 mg by mouth every 6 (six) hours as needed. For pain      . warfarin (COUMADIN) 5 MG tablet Take 1 tablet (5 mg total) by mouth daily at 6 PM.  100 tablet  1  . [DISCONTINUED] dextromethorphan-guaiFENesin (MUCINEX DM) 30-600 MG per 12 hr tablet Take 1 tablet by mouth 2 (two) times daily as needed.  30 tablet  0  . [DISCONTINUED] traMADol (ULTRAM) 50 MG tablet Take 1-2 tablets (50-100 mg total) by mouth every 6 (six) hours as needed.  50 tablet  0    Home: Home Living Lives With: Spouse Available Help at Discharge: Family Type of Home: House Home Access: Stairs to enter Secretary/administrator of Steps: 4 Entrance Stairs-Rails: Right;Left Home Layout: One level Bathroom Shower/Tub: Forensic scientist: Standard Bathroom Accessibility: Yes   Functional History: Prior Function Able to Take Stairs?: Yes Driving: Yes Vocation: Full time employment Comments: Publishing copy Status:  Mobility: Bed Mobility Bed Mobility: Supine to Sit Supine to Sit: 7: Independent Sit to Supine: 5: Supervision;HOB flat Transfers Transfers: Sit to Stand;Stand to Sit Sit  to Stand: 4: Min guard;With armrests;From chair/3-in-1 Stand to Sit: 4: Min guard;With armrests;To chair/3-in-1 Ambulation/Gait Ambulation/Gait Assistance: 4: Min guard Ambulation Distance (Feet): 100 Feet (100 feet with RW 100 feet with none.) Assistive device: Rolling walker;None Ambulation/Gait Assistance Details: Gait with RW is steady.  Gait with no AD unsteady pt presents with mild lateral sway.  Sway increased with cervical rotation, flexion and extension.  Pt had multiple LOB episodes but able to recover independently   Gait Pattern: Wide base of support;Decreased stride length Stairs: No Wheelchair Mobility Wheelchair Mobility: No  ADL: ADL Grooming: Min guard Where Assessed - Grooming: Supported standing Upper Body Dressing: Minimal assistance Where Assessed - Upper Body Dressing: Supported sitting Lower Body Dressing: Minimal assistance Where Assessed - Lower Body Dressing: Supported sit to Pharmacist, hospital: Hydrographic surveyor Method: Sit to Barista: Bedside commode Equipment Used: Gait belt Transfers/Ambulation Related to ADLs: Min A with ambulation as pt impulsive and occasionally unsteady  Cognition: Cognition Arousal/Alertness: Awake/alert Orientation Level: Oriented X4 Cognition Overall Cognitive Status: Appears within functional limits for tasks assessed/performed Arousal/Alertness: Awake/alert Orientation Level: Appears intact for tasks assessed Behavior During Session: [facility] for tasks performed   Blood pressure 111/72, pulse 71,  temperature 97.6 F (36.4 C), temperature source Oral, resp. rate 20, height 5\' 11"  (1.803 m), weight 84.414 kg (186 lb 1.6 oz), SpO2 93.00%. Physical Exam  Vitals reviewed.  Constitutional: He is oriented to person, place, and time.  HENT:  Head: Normocephalic and atraumatic.  Eyes: Conjunctivae normal are normal. Pupils are equal, round, and reactive to light.  Pupils reactive to light without  nystagmus  Neck: Neck supple. No thyromegaly present.  Cardiovascular: Normal rate and regular rhythm.  Cardiac rate control  Pulmonary/Chest: Effort normal and breath sounds normal. He has no wheezes.  Abdominal: Soft. Bowel sounds are normal. He exhibits no distension. There is no tenderness.  Incision clean and dressed  Neurological: He is alert and oriented to person, place, and time.  Speech is mildly dysarthric but intelligible. He does follow simple commands. Noted decreased fine motor skills in RUE. Mild weakness right arm. Gaze dysconjugate. He is unable to gaze superiorly and appears to have some limitations in the inferior fields. Lateral gaze grossly intact. Limited depth perception. No focal visual field cuts. Impulsive. No sensory deficits. Patient was a bit impulsive during exam Skin: Skin is warm and dry.  Psychiatric: He has a normal mood and affect. His behavior is normal   Results for orders placed during the hospital encounter of 05/05/12 (from the past 48 hour(s))  TSH     Status: Abnormal   Collection Time   05/07/12 10:47 AM      Component Value Range Comment   TSH 4.823 (*) 0.350 - 4.500 uIU/mL   BODY FLUID CULTURE     Status: Normal (Preliminary result)   Collection Time   05/07/12 12:21 PM      Component Value Range Comment   Specimen Description PLEURAL FLUID RIGHT      Special Requests 2 SYRINGES @ 60CC      Gram Stain        Value: WBC PRESENT,BOTH PMN AND MONONUCLEAR     NO ORGANISMS SEEN   Culture NO GROWTH      Report Status PENDING     BODY FLUID CELL COUNT WITH DIFFERENTIAL     Status: Abnormal   Collection Time   05/07/12 12:21 PM      Component Value Range Comment   Fluid Type-FCT PLEURAL FLUID ADD ON   CORRECTED ON 01/02 AT 4098: PREVIOUSLY REPORTED AS Body Fluid   Color, Fluid BLOODY (*) YELLOW    Appearance, Fluid CLOUDY (*) CLEAR    WBC, Fluid 1064 (*) 0 - 1000 cu mm    Neutrophil Count, Fluid 40 (*) 0 - 25 %    Lymphs, Fluid 41       Monocyte-Macrophage-Serous Fluid 19 (*) 50 - 90 %   PATHOLOGIST SMEAR REVIEW     Status: Normal   Collection Time   05/07/12 12:21 PM      Component Value Range Comment   Path Review Normal and reactive mesothelial cells     PROTIME-INR     Status: Abnormal   Collection Time   05/08/12  4:20 AM      Component Value Range Comment   Prothrombin Time 18.2 (*) 11.6 - 15.2 seconds    INR 1.56 (*) 0.00 - 1.49   HEMOGLOBIN A1C     Status: Normal   Collection Time   05/08/12  4:20 AM      Component Value Range Comment   Hemoglobin A1C 5.0  <5.7 %    Mean Plasma Glucose 97  <  117 mg/dL   CBC WITH DIFFERENTIAL     Status: Abnormal   Collection Time   05/08/12  4:20 AM      Component Value Range Comment   WBC 12.1 (*) 4.0 - 10.5 K/uL    RBC 3.01 (*) 4.22 - 5.81 MIL/uL    Hemoglobin 9.4 (*) 13.0 - 17.0 g/dL    HCT 40.9 (*) 81.1 - 52.0 %    MCV 98.0  78.0 - 100.0 fL    MCH 31.2  26.0 - 34.0 pg    MCHC 31.9  30.0 - 36.0 g/dL    RDW 91.4 (*) 78.2 - 15.5 %    Platelets 337  150 - 400 K/uL    Neutrophils Relative 79 (*) 43 - 77 %    Neutro Abs 9.6 (*) 1.7 - 7.7 K/uL    Lymphocytes Relative 10 (*) 12 - 46 %    Lymphs Abs 1.2  0.7 - 4.0 K/uL    Monocytes Relative 9  3 - 12 %    Monocytes Absolute 1.1 (*) 0.1 - 1.0 K/uL    Eosinophils Relative 1  0 - 5 %    Eosinophils Absolute 0.1  0.0 - 0.7 K/uL    Basophils Relative 0  0 - 1 %    Basophils Absolute 0.0  0.0 - 0.1 K/uL   BASIC METABOLIC PANEL     Status: Abnormal   Collection Time   05/08/12  4:20 AM      Component Value Range Comment   Sodium 138  135 - 145 mEq/L    Potassium 5.1  3.5 - 5.1 mEq/L DELTA CHECK NOTED   Chloride 103  96 - 112 mEq/L    CO2 29  19 - 32 mEq/L    Glucose, Bld 92  70 - 99 mg/dL    BUN 14  6 - 23 mg/dL    Creatinine, Ser 9.56  0.50 - 1.35 mg/dL    Calcium 8.0 (*) 8.4 - 10.5 mg/dL    GFR calc non Af Amer >90  >90 mL/min    GFR calc Af Amer >90  >90 mL/min   LIPID PANEL     Status: Abnormal   Collection Time    05/08/12  4:20 AM      Component Value Range Comment   Cholesterol 114  0 - 200 mg/dL    Triglycerides 54  <213 mg/dL    HDL 34 (*) >08 mg/dL    Total CHOL/HDL Ratio 3.4      VLDL 11  0 - 40 mg/dL    LDL Cholesterol 69  0 - 99 mg/dL   HEPARIN LEVEL (UNFRACTIONATED)     Status: Abnormal   Collection Time   05/08/12 10:15 PM      Component Value Range Comment   Heparin Unfractionated <0.10 (*) 0.30 - 0.70 IU/mL    Dg Chest 1 View  05/07/2012  *RADIOLOGY REPORT*  Clinical Data: Right pleural effusion.  Status post thoracentesis.  CHEST - 1 VIEW  Comparison: 05/06/2012  Findings: Small right pleural effusion has decreased size since previous study.  No evidence of pneumothorax.  There is persistent infiltrate or atelectasis in both lower lobes.  Cardiomegaly is stable.  Previous median sternotomy and aortic valve replacement again noted.  IMPRESSION:  1.  Decreased size of right pleural effusion.  No pneumothorax identified. 2.  Persistent bibasilar atelectasis versus infiltrates. 3.  Stable cardiomegaly.   Original Report Authenticated By: Myles Rosenthal, M.D.  Mr Shirlee [name] Wo Contrast  05/08/2012  *RADIOLOGY REPORT*  Clinical Data:  47 year old male with type A dissection repair. Visual disturbance.  Suggestion of posterior circulation emboli on recent MRI.  Comparison: Brain MRIs 05/06/2012, 05/02/2012.  Contrast:  18 ml MultiHance which was extravasated during attempted postcontrast MRA imaging. No postcontrast imaging was obtained. The patient tolerated this well and was in no acute distress. Standard extravasation treatment guidelines were followed.  MRA NECK WITHOUT CONTRAST  Technique: Angiographic images of the neck were obtained using MRA technique without intravenous contrast.  Findings:  Layering bilateral pleural effusions are evident in the upper chest.  Antegrade flow in the bilateral cervical carotid arteries.  Carotid bifurcations are within normal limits for noncontrast technique.  Antegrade  flow in the left vertebral artery with no abnormality evident.  There is intermittently decreased flow signal in the right vertebral artery throughout the neck.  Initially, the right vertebral artery caliber appears similar to that on the left (series 4 image 92).  In the distal V2 segment and at the skull base the vertebral artery caliber appears significantly reduced.  IMPRESSION: 1.  Intermittently irregular appearance and decreased flow in the right vertebral artery throughout the neck.  The findings in this setting could reflect right vertebral artery thromboembolic disease or vertebral artery dissection.  Neck CTA may characterize further as clinically necessary. 2.  Otherwise negative noncontrast neck MRA. 3.  Intracranial findings are below. 4.  Layering pleural effusions.  MRA HEAD WITHOUT CONTRAST  Technique: Angiographic images of the Circle of Willis were obtained using MRA technique without  intravenous contrast.  Findings:   Decreased antegrade flow in the distal right vertebral artery in keeping with the neck MRA findings.  Robust antegrade flow in the distal left vertebral artery.  Flow in both PICA origins is preserved.  Vertebrobasilar junction is patent.  Right AICA flow is evident.  No basilar artery stenosis. SCA is duplicated on the right.  SCA and PCA origins are within normal limits.  Bilateral PCA branches are within normal limits.  Antegrade flow in both ICA siphons.  No ICA stenosis.  Ophthalmic artery origins are normal.  Posterior communicating arteries are diminutive or absent.  Normal carotid termini.  MCA and ACA origins are normal.  Normal anterior communicating artery and visualized ACA branches. Visualized bilateral MCA branches  are within normal limits.  IMPRESSION: 1.  Decreased flow in the distal right vertebral artery in keeping with the neck MRA findings. Flow at the right PICA origin appears preserved. 2.  Otherwise negative posterior circulation, and otherwise negative  intracranial MRA.   Original Report Authenticated By: Erskine Speed, M.D.    Mr Angiogram Neck Wo Contrast  05/08/2012  *RADIOLOGY REPORT*  Clinical Data:  47 year old male with type A dissection repair. Visual disturbance.  Suggestion of posterior circulation emboli on recent MRI.  Comparison: Brain MRIs 05/06/2012, 05/02/2012.  Contrast:  18 ml MultiHance which was extravasated during attempted postcontrast MRA imaging. No postcontrast imaging was obtained. The patient tolerated this well and was in no acute distress. Standard extravasation treatment guidelines were followed.  MRA NECK WITHOUT CONTRAST  Technique: Angiographic images of the neck were obtained using MRA technique without intravenous contrast.  Findings:  Layering bilateral pleural effusions are evident in the upper chest.  Antegrade flow in the bilateral cervical carotid arteries.  Carotid bifurcations are within normal limits for noncontrast technique.  Antegrade flow in the left vertebral artery with no abnormality evident.  There is intermittently  decreased flow signal in the right vertebral artery throughout the neck.  Initially, the right vertebral artery caliber appears similar to that on the left (series 4 image 92).  In the distal V2 segment and at the skull base the vertebral artery caliber appears significantly reduced.  IMPRESSION: 1.  Intermittently irregular appearance and decreased flow in the right vertebral artery throughout the neck.  The findings in this setting could reflect right vertebral artery thromboembolic disease or vertebral artery dissection.  Neck CTA may characterize further as clinically necessary. 2.  Otherwise negative noncontrast neck MRA. 3.  Intracranial findings are below. 4.  Layering pleural effusions.  MRA HEAD WITHOUT CONTRAST  Technique: Angiographic images of the Circle of Willis were obtained using MRA technique without  intravenous contrast.  Findings:   Decreased antegrade flow in the distal right  vertebral artery in keeping with the neck MRA findings.  Robust antegrade flow in the distal left vertebral artery.  Flow in both PICA origins is preserved.  Vertebrobasilar junction is patent.  Right AICA flow is evident.  No basilar artery stenosis. SCA is duplicated on the right.  SCA and PCA origins are within normal limits.  Bilateral PCA branches are within normal limits.  Antegrade flow in both ICA siphons.  No ICA stenosis.  Ophthalmic artery origins are normal.  Posterior communicating arteries are diminutive or absent.  Normal carotid termini.  MCA and ACA origins are normal.  Normal anterior communicating artery and visualized ACA branches. Visualized bilateral MCA branches  are within normal limits.  IMPRESSION: 1.  Decreased flow in the distal right vertebral artery in keeping with the neck MRA findings. Flow at the right PICA origin appears preserved. 2.  Otherwise negative posterior circulation, and otherwise negative intracranial MRA.   Original Report Authenticated By: Erskine Speed, M.D.    Dg Swallowing Func-speech Pathology  05/08/2012  Lenor Derrick, CCC-SLP     05/08/2012  4:08 PM Objective Swallowing Evaluation: Modified Barium Swallowing Study   Patient Details  Name: Jon [name] MRN: [id number] Date of Birth: Sep 27, 1965  Today's Date: 05/08/2012 Time:  -     Past Medical History:  Past Medical History  Diagnosis Date  . Anxiety   . Bicuspid aortic valve   . Aortic stenosis     mechanical AVR 04/22/12  . Shortness of breath   . Dysrhythmia   . Thoracic aortic aneurysm     replacement aortic root 04/22/12  . 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 Cardiac catheterization   . Rotator cuff repair     12 years ago, Left  . Thoracic aortic aneurysm repair 04/22/2012    Procedure: THORACIC ASCENDING ANEURYSM REPAIR (AAA);  Surgeon:  [name], MD;  Location: [facility] OR;  Service: Open Heart  Surgery;  Laterality:  N/A;  . Coronary artery bypass graft 04/22/2012    Procedure: CORONARY ARTERY BYPASS GRAFTING (CABG);  Surgeon:  [name], MD;  Location: [facility] OR;  Service: Open Heart  Surgery;  Laterality: N/A;  times one with right greater  saphenous vein graft  . Intraoperative transesophageal echocardiogram 04/22/2012    Procedure: INTRAOPERATIVE TRANSESOPHAGEAL ECHOCARDIOGRAM;   Surgeon: [name], MD;  Location: [facility] OR;  Service:  Open Heart Surgery;  Laterality: N/A;   HPI:  Jon [name] is a 47 y.o. male presenting with  difficulty focusing on objects,nausea, vomiting,  fall,  non-responsive for short time but then could get him to partially  respond, after a few minutes he came back to his normal state. He  did not remember the occurrence. She visualized no shaking, no  incontinence but that his eyes rolled back in his head and he was  diaphorectic. Imaging reveals definite new punctate infarction  within the left cerebellum and with possible punctate infarctions  in occiptal lobes consistent with micro-emboli. Work up underway.  On warfarin prior to admission. Now on warfarin for secondary  stroke prevention. Patient with resultant resolved symptoms in  ER.  During last hospital stay pt had MBS showing aspiratin of  thin liquids improved with chin tuck or with nectar thick  consistency. SLP suspected recurrent laryngeal nerve involvement  following surgery. Pt tolerated this diet until d/c with wife  demonstrating understanding of diet and precautions. Upon d/c  wife reports pt with impulsivity, having some choking with nectar  thick liquids. At this time his CXR has improved since d/c.      Assessment / Plan / Recommendation Clinical Impression  Dysphagia Diagnosis: Mild pharyngeal phase dysphagia Clinical impression: Patient with improved swallow function when  compared with the study on 05/01/12, however, patient now with a  mild pharyngeal dysphagia, characterized by reduced epiglottic   inversion, anterior hyoid movement, and laryngeal elevation,  resulting in penetration of both thin and nectar thick liquids.   There was no penetration when a chin tuck head position was  utilized.  There was no delay to initiate the swallow, and no  laryngeal residue.  No aspiration was observed with any  consistenciy.  Patient remains impulsive, and will require full  supervision, at least initially, to ensure small bites/sips, and  use of chin tuck.     Treatment Recommendation  Therapy as outlined in treatment plan below    Diet Recommendation Regular;Thin liquid   Liquid Administration via: Cup;No straw Medication Administration: Whole meds with puree Supervision: Patient able to self feed;Full supervision/cueing  for compensatory strategies Compensations: Slow rate;Small sips/bites Postural Changes and/or Swallow Maneuvers: Chin tuck    Other  Recommendations Recommended Consults:  (FEES equipment is  down.  MBS was completed instead.) Oral Care Recommendations: Oral care BID   Follow Up Recommendations  Inpatient Rehab    Frequency and Duration min 2x/week  2 weeks   Pertinent Vitals/Pain n/a    SLP Swallow Goals Patient will consume recommended diet without observed clinical  signs of aspiration with: Minimal assistance;Supervision/safety Patient will utilize recommended strategies during swallow to  increase swallowing safety with: Minimal  assistance;Supervision/safety   General HPI: Jon [name] is a 47 y.o. male presenting  with difficulty focusing on objects,nausea, vomiting, fall,  non-responsive for short time but then could get him to partially  respond, after a few minutes he came back to his normal state. He  did not remember the occurrence. She visualized no shaking, no  incontinence but that his eyes rolled back in his head and he was  diaphorectic. Imaging reveals definite new punctate infarction  within the left cerebellum and with possible punctate infarctions  in occiptal lobes  consistent with micro-emboli. Work up underway.  On warfarin prior to admission. Now on warfarin for secondary  stroke prevention. Patient with resultant resolved symptoms in  ER.  During last hospital stay pt had MBS showing aspiratin of  thin liquids improved with chin tuck or with nectar thick  consistency. SLP suspected recurrent laryngeal nerve involvement  following surgery. Pt tolerated this diet until d/c with wife  demonstrating understanding of diet and precautions. Upon d/c  wife reports pt with impulsivity, having some choking with nectar  thick liquids. At this time his CXR has improved since d/c.  Type of Study: Modified Barium Swallowing Study Reason for Referral: Objectively evaluate swallowing function Previous Swallow Assessment: MBS 05/01/12 Pt presents with a  pharyngeal dysphagia, isolated to inadequate laryngeal closure  during the pharyngeal phase.  This impairment leads to consistent  aspiration of thin liquids, accompanied by a nonprotective cough  response.  Postural adjustments (head turn to left and right)  increased aspiration; a chin tuck resulted in liquids penetrating  the larynx but not descending below the vocal cords.  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  After  lengthy discussion with pt and spouse, rec: nectar-thick liquids  OR thin liquids with use of a chin-tuck; meds whole with puree;  Diet Prior to this Study: Regular;Nectar-thick liquids Temperature Spikes Noted: No Respiratory Status: Room air History of Recent Intubation: Yes Length of Intubations (days): 1 days Date extubated: 04/27/12 Behavior/Cognition: Alert;Impulsive;Decreased sustained attention Oral Cavity - Dentition: Adequate natural dentition Oral Motor / Sensory Function: Within functional limits Self-Feeding Abilities: Able to feed self;Needs assist Patient Positioning: Upright in chair Baseline Vocal Quality: Hoarse Volitional Cough: Strong;Congested Volitional Swallow: Able  to elicit Anatomy: Within functional limits Pharyngeal Secretions: Not observed secondary MBS    Reason for Referral Objectively evaluate swallowing function   Oral Phase Oral Preparation/Oral Phase Oral Phase: WFL   Pharyngeal Phase Pharyngeal Phase Pharyngeal Phase: Impaired Pharyngeal - Nectar Pharyngeal - Nectar Teaspoon: Reduced laryngeal elevation;Reduced  epiglottic inversion;Reduced anterior laryngeal  mobility;Penetration/Aspiration during swallow Pharyngeal - Nectar Cup: Reduced epiglottic inversion;Reduced  anterior laryngeal mobility;Reduced laryngeal  elevation;Penetration/Aspiration during swallow Pharyngeal - Thin Pharyngeal - Thin Teaspoon: Reduced anterior laryngeal  mobility;Reduced laryngeal elevation;Reduced epiglottic  inversion;Penetration/Aspiration during swallow Pharyngeal - Thin Cup: Reduced epiglottic inversion;Reduced  anterior laryngeal mobility;Reduced laryngeal  elevation;Penetration/Aspiration during swallow Penetration/Aspiration details (thin cup): Material enters  airway, CONTACTS cords and not ejected out  Cervical Esophageal Phase    GO    Cervical Esophageal Phase Cervical Esophageal Phase: Vicente Masson T 05/08/2012, 4:08 PM     US Thoracentesis Asp Pleural Space W/img Guide  05/08/2012  *RADIOLOGY REPORT*  Clinical Data:  Patient status post aortic dissection repair, aortic valve placement, CABG.  Now with right pleural effusion. Request is made for diagnostic and therapeutic right thoracentesis.  ULTRASOUND GUIDED DIAGNOSTIC AND THERAPEUTIC RIGHT  THORACENTESIS  An ultrasound guided thoracentesis was thoroughly discussed with the patient and questions answered.  The benefits, risks, alternatives and complications were also discussed.  The patient understands and wishes to proceed with the procedure.  Written consent was obtained.  Ultrasound was performed to localize and mark an adequate pocket of fluid in the right chest.  The area was then prepped and draped in  the normal sterile fashion.  1% Lidocaine was used for local anesthesia.  Under ultrasound guidance a 19 gauge Yueh catheter was introduced.  Thoracentesis was performed.  The catheter was removed and a dressing applied.  Complications:  none  Findings: A total of approximately 1.8 liters of blood-tinged fluid was removed. A fluid sample was sent for culture.  IMPRESSION: Successful ultrasound guided diagnostic and therapeutic right thoracentesis yielding 1.8 liters of pleural fluid.  Read by: Jeananne Rama, P.A.-C   Original Report Authenticated  By: Irish Lack, M.D.     Post Admission Physician Evaluation: 1. Functional deficits secondary  to embolic cerebellar and occipital lobe infarcts. 2. Patient is admitted to receive collaborative, interdisciplinary care between the physiatrist, rehab nursing staff, and therapy team. 3. Patient's level of medical complexity and substantial therapy needs in context of that medical necessity cannot be provided at a lesser intensity of care such as a SNF. 4. Patient has experienced substantial functional loss from his/her baseline which was documented above under the "Functional History" and "Functional Status" headings.  Judging by the patient's diagnosis, physical exam, and functional history, the patient has potential for functional progress which will result in measurable gains while on inpatient rehab.  These gains will be of substantial and practical use upon discharge  in facilitating mobility and self-care at the household level. 5. Physiatrist will provide 24 hour management of medical needs as well as oversight of the therapy plan/treatment and provide guidance as appropriate regarding the interaction of the two. 6. 24 hour rehab nursing will assist with bladder management, bowel management, safety, skin/wound care, disease management, medication administration, pain management and patient education  and help integrate therapy concepts,  techniques,education, etc. 7. PT will assess and treat for:  Lower extremity strength, range of motion, stamina, balance, functional mobility, safety, adaptive techniques, visual deficits and equipment, NMR, vestibular deficits, education.  Goals are: mod I to supervision. 8. OT will assess and treat for: ADL's, functional mobility, safety, upper extremity strength, adaptive techniques and equipment, NMR, vestibular and visual deficits.   Goals are: mod I to supervision. 9. SLP will assess and treat for: n/a.  Goals are: n/a. 10. Case Management and Social Worker will assess and treat for psychological issues and discharge planning. 11. Team conference will be held weekly to assess progress toward goals and to determine barriers to discharge. 12. Patient will receive at least 3 hours of therapy per day at least 5 days per week. 13. ELOS: 5-7 days      Prognosis:  excellent   Medical Problem List and Plan: 1. embolic cerebellar and occipital lobe infarcts 2. DVT Prophylaxis/Anticoagulation: Chronic Coumadin therapy. INR today of 1.66. Continue intravenous heparin to IR greater than 2.00 Monitor for a bleeding episodes 3. Pain Management: Percocet one tablet every 4 hours as needed severe pain. Monitor the increased mobility 4. Neuropsych: This patient is capable of making decisions on his/her own behalf. 5. 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 Followup per cardiology services 6. Hypertension. Amiodarone 200 mg twice a day. 7. Right pleural effusion. Status post thoracentesis 05/08/2011. Followup chest x-ray with no pneumothorax. Check oxygen saturations every shift  05/09/2012  Ivory Broad, MD

## 2012-05-09 NOTE — Plan of Care (Signed)
Overall Plan of Care [facility]) Patient Details Name: Jon [name] MRN: [id number] DOB: 1966-05-02  Diagnosis:  Occipital and cerebellar infarcts  Primary Diagnosis:    <principal problem not specified> Co-morbidities: htn, anxiety, thoracic aortic aneurysm  Functional Problem List  Patient demonstrates impairments in the following areas: Balance, Behavior, Bladder, Bowel, Cognition, Endurance, Medication Management, Nutrition, Pain, Perception, Safety, Sensory , Skin Integrity and Vision  Basic ADL's: bathing, dressing and toileting Advanced ADL's: not applicable at this time  Transfers:  bed mobility, bed to chair, toilet, tub/shower, car and furniture Locomotion:  ambulation and stairs  Additional Impairments:  None  Anticipated Outcomes Item Anticipated Outcome  Eating/Swallowing  supervision  Basic self-care  supervision  Tolieting  supervision  Bowel/Bladder  Continent of bowel and bladder mod independent  Transfers  Supervision basic and car  Locomotion  Supervision 150' controlled, 50' home ambulation, up/down 5 steps; min assist ambulation 300' in community env  Communication    Cognition  supervision  Pain  3 or less  Safety/Judgment  supervision  Other     Therapy Plan: PT Intensity: Minimum of 1-2 x/day ,45 to 90 minutes PT Frequency: 5 out of 7 days PT Duration Estimated Length of Stay: 7-10 OT Intensity: Minimum of 1-2 x/day, 45 to 90 minutes OT Frequency: 5 out of 7 days OT Duration/Estimated Length of Stay: 7 days SLP Intensity: Minumum of 1-2 x/day, 30 to 90 minutes SLP Frequency: 5 out of 7 days SLP Duration/Estimated Length of Stay: 5-7 days    Team Interventions: Item RN PT OT SLP SW TR Other  Self Care/Advanced ADL Retraining   x      Neuromuscular Re-Education  x x      Therapeutic Activities  x x x     UE/LE Strength Training/ROM  x       UE/LE Coordination Activities  x       Visual/Perceptual Remediation/Compensation   x       DME/Adaptive Equipment Instruction  x x      Therapeutic Exercise  x x      Balance/Vestibular Training  x x      Patient/Family Education x x x x     Cognitive Remediation/Compensation  x x x     Functional Mobility Training  x x      Ambulation/Gait Training  x       Museum/gallery curator  x       Wheelchair Propulsion/Positioning  x       Functional Tourist information centre manager Reintegration  x       Dysphagia/Aspiration Printmaker x   x     Speech/Language Facilitation         Bladder Management x        Bowel Management x        Disease Management/Prevention x        Pain Management x        Medication Management x   x     Skin Care/Wound Management x        Splinting/Orthotics         Discharge Planning x x x x     Psychosocial Support x x                              Team Discharge Planning: Destination: PT-  ,OT- Home , SLP-Home Projected Follow-up: PT OPPT rehab, transitioning  to cardiac rehab , OT-  Outpatient OT, SLP-24 hour supervision/assistance;Outpatient SLP Projected Equipment Needs: PT- , OT- Tub/shower bench, SLP-None recommended by SLP Patient/family involved in discharge planning: PT-  ,  OT-Patient;Family member/caregiver, SLP-Patient  MD ELOS: one week Medical Rehab Prognosis:  Excellent Assessment: The patient has been admitted for CIR therapies. The team will be addressing, functional mobility, strength, stamina, balance, safety, adaptive techniques/equipment, self-care, bowel and bladder mgt, patient and caregiver education, visual-spatial deficits, secondary stroke prevention education. Goals have been set at supervision for basic mobility and self-care. The patient is motivated to participate and return home.      See Team Conference Notes for weekly updates to the plan of care

## 2012-05-09 NOTE — H&P (Signed)
Physical Medicine and Rehabilitation Admission H&P    No chief complaint on file. : HPI: Jon [name] is a 47 y.o. 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 Patient was readmitted 05/05/2012 after being found nonresponsive by his wife and diaphoretic. After about 2-3 minutes he came around. He did not recall the period of time he was unresponsive. There was no report of seizure. He was having complaints of difficulty focusing on objects. MRI of the brain showed new punctate infarct within the left cerebellum question a few other punctate infarcts in the occipital lobes. Cardiac enzymes were negative. Echocardiogram showed ejection fraction of 40% with hypokinesis of the entire anteroseptal myocardium. MRA of head and neck were unremarkable. EEG was pending. Bouts of bradycardia into the 30s while sleeping of which remained asymptomatic and followup per cardiology services and metoprolol was discontinued and remains on amiodarone. Patient remains on chronic Coumadin therapy as prior to hospital admission with intravenous heparin added until INR greater than 2.00 with latest INR of 1.66. Hospital course right pleural effusion underwent thoracentesis 05/08/2011 with 1.8 L of blood-tinged fluid removed. Followup chest x-ray with no pneumothorax. Patient continues to have issues in relation to focusing and limited vision as he was seen by ophthalmology while hospitalized on 05/01/2012 exam was essentially normal with possibility of refraction. Physical and occupational therapy evaluations completed an ongoing. Noted issues in regards to being impulsive and limited awareness of deficits. M.D./PT/OT have requested physical medicine rehabilitation consult to consider inpatient rehabilitation services. Patient was felt to be a good  candidate for inpatient rehabilitation services was admitted for a comprehensive rehabilitation program  Review of Systems  Eyes: Positive for blurred vision and double vision.  Gastrointestinal: Positive for nausea and vomiting.  Neurological: Positive for weakness.  All other systems reviewed and are negative   Past Medical History  Diagnosis Date  . Anxiety   . Bicuspid aortic valve   . Aortic stenosis     mechanical AVR 04/22/12  . Shortness of breath   . Dysrhythmia   . Thoracic aortic aneurysm     replacement aortic root 04/22/12  . 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 Cardiac catheterization   . Rotator cuff repair     12 years ago, Left  . Thoracic aortic aneurysm repair 04/22/2012    Procedure: THORACIC ASCENDING ANEURYSM REPAIR (AAA);  Surgeon: Steven C Hendrickson, MD;  Location: [facility] OR;  Service: Open Heart Surgery;  Laterality: N/A;  . Coronary artery bypass graft 04/22/2012    Procedure: CORONARY ARTERY BYPASS GRAFTING (CABG);  Surgeon: Steven C Hendrickson, MD;  Location: [facility] OR;  Service: Open Heart Surgery;  Laterality: N/A;  times one with right greater saphenous vein graft  . Intraoperative transesophageal echocardiogram 04/22/2012    Procedure: INTRAOPERATIVE TRANSESOPHAGEAL ECHOCARDIOGRAM;  Surgeon: Steven C Hendrickson, MD;  Location: [facility] OR;  Service: Open Heart Surgery;  Laterality: N/A;   Family History  Problem Relation Age of Onset  . Lung disease Mother   . Diabetes Father   . Peripheral Artery Disease Father   . Hypertension Father    Social History:  reports that he has been smoking Cigarettes.  He has been smoking about 2.5 packs per day. He does not have any smokeless tobacco history on file. He reports that he   drinks alcohol. He reports that he does not use illicit drugs. Allergies:  Allergies  Allergen Reactions  . Dilaudid (Hydromorphone Hcl) Other (See Comments)    High doses cause  itching  . Penicillins Other (See Comments)    Unknown   Medications Prior to Admission  Medication Sig Dispense Refill  . amiodarone (PACERONE) 400 MG tablet Take 0.5 tablets (200 mg total) by mouth 2 (two) times daily.  60 tablet  1  . dextromethorphan-guaiFENesin (MUCINEX DM) 30-600 MG per 12 hr tablet Take 1 tablet by mouth 2 (two) times daily as needed. congestion      . food thickener (RESOURCE THICKENUP CLEAR) POWD Take 1 Can by mouth daily.      . levofloxacin (LEVAQUIN) 500 MG tablet Take 1 tablet (500 mg total) by mouth daily.  4 tablet  0  . metoprolol tartrate (LOPRESSOR) 25 MG tablet Take 1 tablet (25 mg total) by mouth 2 (two) times daily.  60 tablet  1  . pantoprazole (PROTONIX) 40 MG tablet Take 1 tablet (40 mg total) by mouth daily.  30 tablet  1  . traMADol (ULTRAM) 50 MG tablet Take 50-100 mg by mouth every 6 (six) hours as needed. For pain      . warfarin (COUMADIN) 5 MG tablet Take 1 tablet (5 mg total) by mouth daily at 6 PM.  100 tablet  1  . [DISCONTINUED] dextromethorphan-guaiFENesin (MUCINEX DM) 30-600 MG per 12 hr tablet Take 1 tablet by mouth 2 (two) times daily as needed.  30 tablet  0  . [DISCONTINUED] traMADol (ULTRAM) 50 MG tablet Take 1-2 tablets (50-100 mg total) by mouth every 6 (six) hours as needed.  50 tablet  0    Home: Home Living Lives With: Spouse Available Help at Discharge: Family Type of Home: House Home Access: Stairs to enter Entrance Stairs-Number of Steps: 4 Entrance Stairs-Rails: Right;Left Home Layout: One level Bathroom Shower/Tub: Tub/shower unit;Curtain Bathroom Toilet: Standard Bathroom Accessibility: Yes   Functional History: Prior Function Able to Take Stairs?: Yes Driving: Yes Vocation: Full time employment Comments: mechanic  Functional Status:  Mobility: Bed Mobility Bed Mobility: Supine to Sit Supine to Sit: 7: Independent Sit to Supine: 5: Supervision;HOB flat Transfers Transfers: Sit to Stand;Stand to Sit Sit  to Stand: 4: Min guard;With armrests;From chair/3-in-1 Stand to Sit: 4: Min guard;With armrests;To chair/3-in-1 Ambulation/Gait Ambulation/Gait Assistance: 4: Min guard Ambulation Distance (Feet): 100 Feet (100 feet with RW 100 feet with none.) Assistive device: Rolling walker;None Ambulation/Gait Assistance Details: Gait with RW is steady.  Gait with no AD unsteady pt presents with mild lateral sway.  Sway increased with cervical rotation, flexion and extension.  Pt had multiple LOB episodes but able to recover independently   Gait Pattern: Wide base of support;Decreased stride length Stairs: No Wheelchair Mobility Wheelchair Mobility: No  ADL: ADL Grooming: Min guard Where Assessed - Grooming: Supported standing Upper Body Dressing: Minimal assistance Where Assessed - Upper Body Dressing: Supported sitting Lower Body Dressing: Minimal assistance Where Assessed - Lower Body Dressing: Supported sit to stand Toilet Transfer: Min guard Toilet Transfer Method: Sit to stand Toilet Transfer Equipment: Bedside commode Equipment Used: Gait belt Transfers/Ambulation Related to ADLs: Min A with ambulation as pt impulsive and occasionally unsteady  Cognition: Cognition Arousal/Alertness: Awake/alert Orientation Level: Oriented X4 Cognition Overall Cognitive Status: Appears within functional limits for tasks assessed/performed Arousal/Alertness: Awake/alert Orientation Level: Appears intact for tasks assessed Behavior During Session: WFL for tasks performed   Blood pressure 111/72, pulse 71,   temperature 97.6 F (36.4 C), temperature source Oral, resp. rate 20, height 5' 11" (1.803 m), weight 84.414 kg (186 lb 1.6 oz), SpO2 93.00%. Physical Exam  Vitals reviewed.  Constitutional: He is oriented to person, place, and time.  HENT:  Head: Normocephalic and atraumatic.  Eyes: Conjunctivae normal are normal. Pupils are equal, round, and reactive to light.  Pupils reactive to light without  nystagmus  Neck: Neck supple. No thyromegaly present.  Cardiovascular: Normal rate and regular rhythm.  Cardiac rate control  Pulmonary/Chest: Effort normal and breath sounds normal. He has no wheezes.  Abdominal: Soft. Bowel sounds are normal. He exhibits no distension. There is no tenderness.  Incision clean and dressed  Neurological: He is alert and oriented to person, place, and time.  Speech is mildly dysarthric but intelligible. He does follow simple commands. Noted decreased fine motor skills in RUE. Mild weakness right arm. Gaze dysconjugate. He is unable to gaze superiorly and appears to have some limitations in the inferior fields. Lateral gaze grossly intact. Limited depth perception. No focal visual field cuts. Impulsive. No sensory deficits. Patient was a bit impulsive during exam Skin: Skin is warm and dry.  Psychiatric: He has a normal mood and affect. His behavior is normal   Results for orders placed during the hospital encounter of 05/05/12 (from the past 48 hour(s))  TSH     Status: Abnormal   Collection Time   05/07/12 10:47 AM      Component Value Range Comment   TSH 4.823 (*) 0.350 - 4.500 uIU/mL   BODY FLUID CULTURE     Status: Normal (Preliminary result)   Collection Time   05/07/12 12:21 PM      Component Value Range Comment   Specimen Description PLEURAL FLUID RIGHT      Special Requests 2 SYRINGES @ 60CC      Gram Stain        Value: WBC PRESENT,BOTH PMN AND MONONUCLEAR     NO ORGANISMS SEEN   Culture NO GROWTH      Report Status PENDING     BODY FLUID CELL COUNT WITH DIFFERENTIAL     Status: Abnormal   Collection Time   05/07/12 12:21 PM      Component Value Range Comment   Fluid Type-FCT PLEURAL FLUID ADD ON   CORRECTED ON 01/02 AT 0313: PREVIOUSLY REPORTED AS Body Fluid   Color, Fluid BLOODY (*) YELLOW    Appearance, Fluid CLOUDY (*) CLEAR    WBC, Fluid 1064 (*) 0 - 1000 cu mm    Neutrophil Count, Fluid 40 (*) 0 - 25 %    Lymphs, Fluid 41       Monocyte-Macrophage-Serous Fluid 19 (*) 50 - 90 %   PATHOLOGIST SMEAR REVIEW     Status: Normal   Collection Time   05/07/12 12:21 PM      Component Value Range Comment   Path Review Normal and reactive mesothelial cells     PROTIME-INR     Status: Abnormal   Collection Time   05/08/12  4:20 AM      Component Value Range Comment   Prothrombin Time 18.2 (*) 11.6 - 15.2 seconds    INR 1.56 (*) 0.00 - 1.49   HEMOGLOBIN A1C     Status: Normal   Collection Time   05/08/12  4:20 AM      Component Value Range Comment   Hemoglobin A1C 5.0  <5.7 %    Mean Plasma Glucose 97  <  117 mg/dL   CBC WITH DIFFERENTIAL     Status: Abnormal   Collection Time   05/08/12  4:20 AM      Component Value Range Comment   WBC 12.1 (*) 4.0 - 10.5 K/uL    RBC 3.01 (*) 4.22 - 5.81 MIL/uL    Hemoglobin 9.4 (*) 13.0 - 17.0 g/dL    HCT 29.5 (*) 39.0 - 52.0 %    MCV 98.0  78.0 - 100.0 fL    MCH 31.2  26.0 - 34.0 pg    MCHC 31.9  30.0 - 36.0 g/dL    RDW 19.9 (*) 11.5 - 15.5 %    Platelets 337  150 - 400 K/uL    Neutrophils Relative 79 (*) 43 - 77 %    Neutro Abs 9.6 (*) 1.7 - 7.7 K/uL    Lymphocytes Relative 10 (*) 12 - 46 %    Lymphs Abs 1.2  0.7 - 4.0 K/uL    Monocytes Relative 9  3 - 12 %    Monocytes Absolute 1.1 (*) 0.1 - 1.0 K/uL    Eosinophils Relative 1  0 - 5 %    Eosinophils Absolute 0.1  0.0 - 0.7 K/uL    Basophils Relative 0  0 - 1 %    Basophils Absolute 0.0  0.0 - 0.1 K/uL   BASIC METABOLIC PANEL     Status: Abnormal   Collection Time   05/08/12  4:20 AM      Component Value Range Comment   Sodium 138  135 - 145 mEq/L    Potassium 5.1  3.5 - 5.1 mEq/L DELTA CHECK NOTED   Chloride 103  96 - 112 mEq/L    CO2 29  19 - 32 mEq/L    Glucose, Bld 92  70 - 99 mg/dL    BUN 14  6 - 23 mg/dL    Creatinine, Ser 0.86  0.50 - 1.35 mg/dL    Calcium 8.0 (*) 8.4 - 10.5 mg/dL    GFR calc non Af Amer >90  >90 mL/min    GFR calc Af Amer >90  >90 mL/min   LIPID PANEL     Status: Abnormal   Collection Time    05/08/12  4:20 AM      Component Value Range Comment   Cholesterol 114  0 - 200 mg/dL    Triglycerides 54  <150 mg/dL    HDL 34 (*) >39 mg/dL    Total CHOL/HDL Ratio 3.4      VLDL 11  0 - 40 mg/dL    LDL Cholesterol 69  0 - 99 mg/dL   HEPARIN LEVEL (UNFRACTIONATED)     Status: Abnormal   Collection Time   05/08/12 10:15 PM      Component Value Range Comment   Heparin Unfractionated <0.10 (*) 0.30 - 0.70 IU/mL    Dg Chest 1 View  05/07/2012  *RADIOLOGY REPORT*  Clinical Data: Right pleural effusion.  Status post thoracentesis.  CHEST - 1 VIEW  Comparison: 05/06/2012  Findings: Small right pleural effusion has decreased size since previous study.  No evidence of pneumothorax.  There is persistent infiltrate or atelectasis in both lower lobes.  Cardiomegaly is stable.  Previous median sternotomy and aortic valve replacement again noted.  IMPRESSION:  1.  Decreased size of right pleural effusion.  No pneumothorax identified. 2.  Persistent bibasilar atelectasis versus infiltrates. 3.  Stable cardiomegaly.   Original Report Authenticated By: John Stahl, M.D.      Mr Mra Head Wo Contrast  05/08/2012  *RADIOLOGY REPORT*  Clinical Data:  46-year-old male with type A dissection repair. Visual disturbance.  Suggestion of posterior circulation emboli on recent MRI.  Comparison: Brain MRIs 05/06/2012, 05/02/2012.  Contrast:  18 ml MultiHance which was extravasated during attempted postcontrast MRA imaging. No postcontrast imaging was obtained. The patient tolerated this well and was in no acute distress. Standard extravasation treatment guidelines were followed.  MRA NECK WITHOUT CONTRAST  Technique: Angiographic images of the neck were obtained using MRA technique without intravenous contrast.  Findings:  Layering bilateral pleural effusions are evident in the upper chest.  Antegrade flow in the bilateral cervical carotid arteries.  Carotid bifurcations are within normal limits for noncontrast technique.  Antegrade  flow in the left vertebral artery with no abnormality evident.  There is intermittently decreased flow signal in the right vertebral artery throughout the neck.  Initially, the right vertebral artery caliber appears similar to that on the left (series 4 image 92).  In the distal V2 segment and at the skull base the vertebral artery caliber appears significantly reduced.  IMPRESSION: 1.  Intermittently irregular appearance and decreased flow in the right vertebral artery throughout the neck.  The findings in this setting could reflect right vertebral artery thromboembolic disease or vertebral artery dissection.  Neck CTA may characterize further as clinically necessary. 2.  Otherwise negative noncontrast neck MRA. 3.  Intracranial findings are below. 4.  Layering pleural effusions.  MRA HEAD WITHOUT CONTRAST  Technique: Angiographic images of the Circle of Willis were obtained using MRA technique without  intravenous contrast.  Findings:   Decreased antegrade flow in the distal right vertebral artery in keeping with the neck MRA findings.  Robust antegrade flow in the distal left vertebral artery.  Flow in both PICA origins is preserved.  Vertebrobasilar junction is patent.  Right AICA flow is evident.  No basilar artery stenosis. SCA is duplicated on the right.  SCA and PCA origins are within normal limits.  Bilateral PCA branches are within normal limits.  Antegrade flow in both ICA siphons.  No ICA stenosis.  Ophthalmic artery origins are normal.  Posterior communicating arteries are diminutive or absent.  Normal carotid termini.  MCA and ACA origins are normal.  Normal anterior communicating artery and visualized ACA branches. Visualized bilateral MCA branches  are within normal limits.  IMPRESSION: 1.  Decreased flow in the distal right vertebral artery in keeping with the neck MRA findings. Flow at the right PICA origin appears preserved. 2.  Otherwise negative posterior circulation, and otherwise negative  intracranial MRA.   Original Report Authenticated By: H. Hall III, M.D.    Mr Angiogram Neck Wo Contrast  05/08/2012  *RADIOLOGY REPORT*  Clinical Data:  46-year-old male with type A dissection repair. Visual disturbance.  Suggestion of posterior circulation emboli on recent MRI.  Comparison: Brain MRIs 05/06/2012, 05/02/2012.  Contrast:  18 ml MultiHance which was extravasated during attempted postcontrast MRA imaging. No postcontrast imaging was obtained. The patient tolerated this well and was in no acute distress. Standard extravasation treatment guidelines were followed.  MRA NECK WITHOUT CONTRAST  Technique: Angiographic images of the neck were obtained using MRA technique without intravenous contrast.  Findings:  Layering bilateral pleural effusions are evident in the upper chest.  Antegrade flow in the bilateral cervical carotid arteries.  Carotid bifurcations are within normal limits for noncontrast technique.  Antegrade flow in the left vertebral artery with no abnormality evident.  There is intermittently   decreased flow signal in the right vertebral artery throughout the neck.  Initially, the right vertebral artery caliber appears similar to that on the left (series 4 image 92).  In the distal V2 segment and at the skull base the vertebral artery caliber appears significantly reduced.  IMPRESSION: 1.  Intermittently irregular appearance and decreased flow in the right vertebral artery throughout the neck.  The findings in this setting could reflect right vertebral artery thromboembolic disease or vertebral artery dissection.  Neck CTA may characterize further as clinically necessary. 2.  Otherwise negative noncontrast neck MRA. 3.  Intracranial findings are below. 4.  Layering pleural effusions.  MRA HEAD WITHOUT CONTRAST  Technique: Angiographic images of the Circle of Willis were obtained using MRA technique without  intravenous contrast.  Findings:   Decreased antegrade flow in the distal right  vertebral artery in keeping with the neck MRA findings.  Robust antegrade flow in the distal left vertebral artery.  Flow in both PICA origins is preserved.  Vertebrobasilar junction is patent.  Right AICA flow is evident.  No basilar artery stenosis. SCA is duplicated on the right.  SCA and PCA origins are within normal limits.  Bilateral PCA branches are within normal limits.  Antegrade flow in both ICA siphons.  No ICA stenosis.  Ophthalmic artery origins are normal.  Posterior communicating arteries are diminutive or absent.  Normal carotid termini.  MCA and ACA origins are normal.  Normal anterior communicating artery and visualized ACA branches. Visualized bilateral MCA branches  are within normal limits.  IMPRESSION: 1.  Decreased flow in the distal right vertebral artery in keeping with the neck MRA findings. Flow at the right PICA origin appears preserved. 2.  Otherwise negative posterior circulation, and otherwise negative intracranial MRA.   Original Report Authenticated By: H. Hall III, M.D.    Dg Swallowing Func-speech Pathology  05/08/2012  Lori T Willis, CCC-SLP     05/08/2012  4:08 PM Objective Swallowing Evaluation: Modified Barium Swallowing Study   Patient Details  Name: Lukus Ray Koebel MRN: [id number] Date of Birth: 08/23/1965  Today's Date: 05/08/2012 Time:  -     Past Medical History:  Past Medical History  Diagnosis Date  . Anxiety   . Bicuspid aortic valve   . Aortic stenosis     mechanical AVR 04/22/12  . Shortness of breath   . Dysrhythmia   . Thoracic aortic aneurysm     replacement aortic root 04/22/12  . 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 Cardiac catheterization   . Rotator cuff repair     12 years ago, Left  . Thoracic aortic aneurysm repair 04/22/2012    Procedure: THORACIC ASCENDING ANEURYSM REPAIR (AAA);  Surgeon:  Steven C Hendrickson, MD;  Location: [facility] OR;  Service: Open Heart  Surgery;  Laterality:  N/A;  . Coronary artery bypass graft 04/22/2012    Procedure: CORONARY ARTERY BYPASS GRAFTING (CABG);  Surgeon:  Steven C Hendrickson, MD;  Location: [facility] OR;  Service: Open Heart  Surgery;  Laterality: N/A;  times one with right greater  saphenous vein graft  . Intraoperative transesophageal echocardiogram 04/22/2012    Procedure: INTRAOPERATIVE TRANSESOPHAGEAL ECHOCARDIOGRAM;   Surgeon: Steven C Hendrickson, MD;  Location: [facility] OR;  Service:  Open Heart Surgery;  Laterality: N/A;   HPI:  Mr. Baldomero Ray Melfi is a 47 y.o. male presenting with  difficulty focusing on objects,nausea, vomiting,   fall,  non-responsive for short time but then could get him to partially  respond, after a few minutes he came back to his normal state. He  did not remember the occurrence. She visualized no shaking, no  incontinence but that his eyes rolled back in his head and he was  diaphorectic. Imaging reveals definite new punctate infarction  within the left cerebellum and with possible punctate infarctions  in occiptal lobes consistent with micro-emboli. Work up underway.  On warfarin prior to admission. Now on warfarin for secondary  stroke prevention. Patient with resultant resolved symptoms in  ER.  During last hospital stay pt had MBS showing aspiratin of  thin liquids improved with chin tuck or with nectar thick  consistency. SLP suspected recurrent laryngeal nerve involvement  following surgery. Pt tolerated this diet until d/c with wife  demonstrating understanding of diet and precautions. Upon d/c  wife reports pt with impulsivity, having some choking with nectar  thick liquids. At this time his CXR has improved since d/c.      Assessment / Plan / Recommendation Clinical Impression  Dysphagia Diagnosis: Mild pharyngeal phase dysphagia Clinical impression: Patient with improved swallow function when  compared with the study on 05/01/12, however, patient now with a  mild pharyngeal dysphagia, characterized by reduced epiglottic   inversion, anterior hyoid movement, and laryngeal elevation,  resulting in penetration of both thin and nectar thick liquids.   There was no penetration when a chin tuck head position was  utilized.  There was no delay to initiate the swallow, and no  laryngeal residue.  No aspiration was observed with any  consistenciy.  Patient remains impulsive, and will require full  supervision, at least initially, to ensure small bites/sips, and  use of chin tuck.     Treatment Recommendation  Therapy as outlined in treatment plan below    Diet Recommendation Regular;Thin liquid   Liquid Administration via: Cup;No straw Medication Administration: Whole meds with puree Supervision: Patient able to self feed;Full supervision/cueing  for compensatory strategies Compensations: Slow rate;Small sips/bites Postural Changes and/or Swallow Maneuvers: Chin tuck    Other  Recommendations Recommended Consults:  (FEES equipment is  down.  MBS was completed instead.) Oral Care Recommendations: Oral care BID   Follow Up Recommendations  Inpatient Rehab    Frequency and Duration min 2x/week  2 weeks   Pertinent Vitals/Pain n/a    SLP Swallow Goals Patient will consume recommended diet without observed clinical  signs of aspiration with: Minimal assistance;Supervision/safety Patient will utilize recommended strategies during swallow to  increase swallowing safety with: Minimal  assistance;Supervision/safety   General HPI: Mr. Harless Ray Lenoir is a 47 y.o. male presenting  with difficulty focusing on objects,nausea, vomiting, fall,  non-responsive for short time but then could get him to partially  respond, after a few minutes he came back to his normal state. He  did not remember the occurrence. She visualized no shaking, no  incontinence but that his eyes rolled back in his head and he was  diaphorectic. Imaging reveals definite new punctate infarction  within the left cerebellum and with possible punctate infarctions  in occiptal lobes  consistent with micro-emboli. Work up underway.  On warfarin prior to admission. Now on warfarin for secondary  stroke prevention. Patient with resultant resolved symptoms in  ER.  During last hospital stay pt had MBS showing aspiratin of  thin liquids improved with chin tuck or with nectar thick  consistency. SLP suspected recurrent laryngeal nerve involvement    following surgery. Pt tolerated this diet until d/c with wife  demonstrating understanding of diet and precautions. Upon d/c  wife reports pt with impulsivity, having some choking with nectar  thick liquids. At this time his CXR has improved since d/c.  Type of Study: Modified Barium Swallowing Study Reason for Referral: Objectively evaluate swallowing function Previous Swallow Assessment: MBS 05/01/12 Pt presents with a  pharyngeal dysphagia, isolated to inadequate laryngeal closure  during the pharyngeal phase.  This impairment leads to consistent  aspiration of thin liquids, accompanied by a nonprotective cough  response.  Postural adjustments (head turn to left and right)  increased aspiration; a chin tuck resulted in liquids penetrating  the larynx but not descending below the vocal cords.  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  After  lengthy discussion with pt and spouse, rec: nectar-thick liquids  OR thin liquids with use of a chin-tuck; meds whole with puree;  Diet Prior to this Study: Regular;Nectar-thick liquids Temperature Spikes Noted: No Respiratory Status: Room air History of Recent Intubation: Yes Length of Intubations (days): 1 days Date extubated: 04/27/12 Behavior/Cognition: Alert;Impulsive;Decreased sustained attention Oral Cavity - Dentition: Adequate natural dentition Oral Motor / Sensory Function: Within functional limits Self-Feeding Abilities: Able to feed self;Needs assist Patient Positioning: Upright in chair Baseline Vocal Quality: Hoarse Volitional Cough: Strong;Congested Volitional Swallow: Able  to elicit Anatomy: Within functional limits Pharyngeal Secretions: Not observed secondary MBS    Reason for Referral Objectively evaluate swallowing function   Oral Phase Oral Preparation/Oral Phase Oral Phase: WFL   Pharyngeal Phase Pharyngeal Phase Pharyngeal Phase: Impaired Pharyngeal - Nectar Pharyngeal - Nectar Teaspoon: Reduced laryngeal elevation;Reduced  epiglottic inversion;Reduced anterior laryngeal  mobility;Penetration/Aspiration during swallow Pharyngeal - Nectar Cup: Reduced epiglottic inversion;Reduced  anterior laryngeal mobility;Reduced laryngeal  elevation;Penetration/Aspiration during swallow Pharyngeal - Thin Pharyngeal - Thin Teaspoon: Reduced anterior laryngeal  mobility;Reduced laryngeal elevation;Reduced epiglottic  inversion;Penetration/Aspiration during swallow Pharyngeal - Thin Cup: Reduced epiglottic inversion;Reduced  anterior laryngeal mobility;Reduced laryngeal  elevation;Penetration/Aspiration during swallow Penetration/Aspiration details (thin cup): Material enters  airway, CONTACTS cords and not ejected out  Cervical Esophageal Phase    GO    Cervical Esophageal Phase Cervical Esophageal Phase: WFL         Willis, Lori T 05/08/2012, 4:08 PM     Us Thoracentesis Asp Pleural Space W/img Guide  05/08/2012  *RADIOLOGY REPORT*  Clinical Data:  Patient status post aortic dissection repair, aortic valve placement, CABG.  Now with right pleural effusion. Request is made for diagnostic and therapeutic right thoracentesis.  ULTRASOUND GUIDED DIAGNOSTIC AND THERAPEUTIC RIGHT  THORACENTESIS  An ultrasound guided thoracentesis was thoroughly discussed with the patient and questions answered.  The benefits, risks, alternatives and complications were also discussed.  The patient understands and wishes to proceed with the procedure.  Written consent was obtained.  Ultrasound was performed to localize and mark an adequate pocket of fluid in the right chest.  The area was then prepped and draped in  the normal sterile fashion.  1% Lidocaine was used for local anesthesia.  Under ultrasound guidance a 19 gauge Yueh catheter was introduced.  Thoracentesis was performed.  The catheter was removed and a dressing applied.  Complications:  none  Findings: A total of approximately 1.8 liters of blood-tinged fluid was removed. A fluid sample was sent for culture.  IMPRESSION: Successful ultrasound guided diagnostic and therapeutic right thoracentesis yielding 1.8 liters of pleural fluid.  Read by: Kevin  Allred, P.A.-C   Original Report Authenticated   By: Glenn Yamagata, M.D.     Post Admission Physician Evaluation: 1. Functional deficits secondary  to embolic cerebellar and occipital lobe infarcts. 2. Patient is admitted to receive collaborative, interdisciplinary care between the physiatrist, rehab nursing staff, and therapy team. 3. Patient's level of medical complexity and substantial therapy needs in context of that medical necessity cannot be provided at a lesser intensity of care such as a SNF. 4. Patient has experienced substantial functional loss from his/her baseline which was documented above under the "Functional History" and "Functional Status" headings.  Judging by the patient's diagnosis, physical exam, and functional history, the patient has potential for functional progress which will result in measurable gains while on inpatient rehab.  These gains will be of substantial and practical use upon discharge  in facilitating mobility and self-care at the household level. 5. Physiatrist will provide 24 hour management of medical needs as well as oversight of the therapy plan/treatment and provide guidance as appropriate regarding the interaction of the two. 6. 24 hour rehab nursing will assist with bladder management, bowel management, safety, skin/wound care, disease management, medication administration, pain management and patient education  and help integrate therapy concepts,  techniques,education, etc. 7. PT will assess and treat for:  Lower extremity strength, range of motion, stamina, balance, functional mobility, safety, adaptive techniques, visual deficits and equipment, NMR, vestibular deficits, education.  Goals are: mod I to supervision. 8. OT will assess and treat for: ADL's, functional mobility, safety, upper extremity strength, adaptive techniques and equipment, NMR, vestibular and visual deficits.   Goals are: mod I to supervision. 9. SLP will assess and treat for: n/a.  Goals are: n/a. 10. Case Management and Social Worker will assess and treat for psychological issues and discharge planning. 11. Team conference will be held weekly to assess progress toward goals and to determine barriers to discharge. 12. Patient will receive at least 3 hours of therapy per day at least 5 days per week. 13. ELOS: 5-7 days      Prognosis:  excellent   Medical Problem List and Plan: 1. embolic cerebellar and occipital lobe infarcts 2. DVT Prophylaxis/Anticoagulation: Chronic Coumadin therapy. INR today of 1.66. Continue intravenous heparin to IR greater than 2.00 Monitor for a bleeding episodes 3. Pain Management: Percocet one tablet every 4 hours as needed severe pain. Monitor the increased mobility 4. Neuropsych: This patient is capable of making decisions on his/her own behalf. 5. 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 Followup per cardiology services 6. Hypertension. Amiodarone 200 mg twice a day. 7. Right pleural effusion. Status post thoracentesis 05/08/2011. Followup chest x-ray with no pneumothorax. Check oxygen saturations every shift  05/09/2012  Zach Jackolyn Geron, MD 

## 2012-05-09 NOTE — Progress Notes (Signed)
  Subjective: Says he feels better A little incisional pain, "but nothing bad" Vision unchanged  Objective: Vital signs in last 24 hours: Temp:  [97.4 F (36.3 C)-98.7 F (37.1 C)] 97.9 F (36.6 C) (01/03 1215) Pulse Rate:  [61-73] 73  (01/03 0800) Cardiac Rhythm:  [-] Heart block (01/03 0800) Resp:  [18-24] 24  (01/03 1215) BP: (95-118)/(54-76) 107/54 mmHg (01/03 1215) SpO2:  [90 %-98 %] 98 % (01/03 1215) Weight:  [186 lb 1.6 oz (84.414 kg)] 186 lb 1.6 oz (84.414 kg) (01/03 0400)  Hemodynamic parameters for last 24 hours:    Intake/Output from previous day: 01/02 0701 - 01/03 0700 In: 181.7 [I.V.:181.7] Out: 675 [Urine:675] Intake/Output this shift: Total I/O In: -  Out: 400 [Urine:400]  General appearance: alert and no distress Heart: regular rate and rhythm Wound: clean and dry  Lab Results:  [facility] 05/09/12 0718 05/08/12 0420  WBC 10.8* 12.1*  HGB 9.4* 9.4*  HCT 29.2* 29.5*  PLT 330 337   BMET:  Basename 05/09/12 0718 05/08/12 0420  NA 137 138  K 4.5 5.1  CL 102 103  CO2 28 29  GLUCOSE 93 92  BUN 10 14  CREATININE 0.69 0.86  CALCIUM 8.1* 8.0*    PT/INR:  Basename 05/09/12 0718  LABPROT 19.1*  INR 1.66*   ABG    Component Value Date/Time   PHART 7.378 04/23/2012 1111   HCO3 26.5* 04/23/2012 1111   TCO2 27 04/23/2012 1728   ACIDBASEDEF 6.0* 04/22/2012 1218   O2SAT 93.0 04/23/2012 1111   CBG (last 3)  No results found for this basename: GLUCAP:3 in the last 72 hours  Assessment/Plan: S/P   - Plan for transfer to inpatient rehab noted INR up to 1.66- on heparin until INR > 2.0, goal is 2.5    LOS: 4 days    HENDRICKSON,STEVEN C 05/09/2012

## 2012-05-09 NOTE — Interval H&P Note (Signed)
Jon [name] was admitted today to Inpatient Rehabilitation with the diagnosis of cerebellar and occipital infarcts.  The patient's history has been reviewed, patient examined, and there is no change in status.  Patient continues to be appropriate for intensive inpatient rehabilitation.  I have reviewed the patient's chart and labs.  Questions were answered to the patient's satisfaction.  SWARTZ,ZACHARY T 05/09/2012, 8:21 PM

## 2012-05-09 NOTE — Progress Notes (Signed)
ANTICOAGULATION CONSULT NOTE - Follow Up Consult  Pharmacy Consult for Heparin and Coumadin Indication: atrial fibrillation and recent mechanical AVR, new CVA this admit (with subtherapeutic INR)  Allergies  Allergen Reactions  . Dilaudid (Hydromorphone Hcl) Other (See Comments)    High doses cause itching  . Penicillins Other (See Comments)    Unknown    Patient Measurements: Height: 5\' 11"  (180.3 cm) Weight: 183 lb 13.8 oz (83.4 kg) IBW/kg (Calculated) : 75.3  Heparin Dosing Weight: 84.4 kg  Vital Signs: Temp: 97.9 F (36.6 C) (01/03 1630) Temp src: Oral (01/03 1630) BP: 125/80 mmHg (01/03 1630) Pulse Rate: 68  (01/03 1630)  Labs:  Alvira Philips 05/09/12 1804 05/09/12 1733 05/09/12 0955 05/09/12 0718 05/08/12 0420 05/07/12 0655 05/07/12 0045 05/07/12 0037  HGB -- -- -- 9.4* 9.4* -- -- --  HCT -- -- -- 29.2* 29.5* -- 29.7* --  PLT -- -- -- 330 337 -- 301 --  APTT -- -- -- -- -- -- -- --  LABPROT -- -- -- 19.1* 18.2* -- 18.6* --  INR -- -- -- 1.66* 1.56* -- 1.61* --  HEPARINUNFRC 0.29* 0.41 0.15* -- -- -- -- --  CREATININE -- -- -- 0.69 0.86 -- 0.95 --  CKTOTAL -- -- -- -- -- -- -- --  CKMB -- -- -- -- -- -- -- --  TROPONINI -- -- -- -- -- <0.30 -- <0.30    Estimated Creatinine Clearance: 122.9 ml/min (by C-G formula based on Cr of 0.69).   Medications:  Scheduled:    . amiodarone  200 mg Oral BID  . aspirin  81 mg Oral Daily  . pantoprazole  40 mg Oral Daily  . [COMPLETED] warfarin  7.5 mg Oral ONCE-1800  . Warfarin - Pharmacist Dosing Inpatient   Does not apply q1800  . [DISCONTINUED] warfarin  7.5 mg Oral ONCE-1800    Goal of Therapy:  INR 2-3 Heparin level 0.3-0.5 units/ml Monitor platelets by anticoagulation protocol: Yes  Assessment and Plan: 47 yo M who unfortunately suffered a CVA while INR was subtherapeutic on Coumadin for hx Afib and mechanical valve.  Pt was followed on inpatient service and is transferring to rehab.  Heparin was started 05/08/12 and  has remained subtherapeutic until today.  Heparin levels were drawn this afternoon ~ 30 minutes apart - likely related to miscommunication with transfer to rehab floor.  First heparin level was 0.41 (thearpeutic), second one 0.29 (slightly subtherapeutic).  I cannot explain the variance in the levels and wonder if the patient's IV site was disconnected temporarily for transport.  Regardless, will increase heparin infusion to 1900 units/hr in the event that the 0.29 level is accurate.  Recheck heparin level with AM labs.  Pt also continues on Coumadin for hx Afib and mechanical valve.  Pt was previously ordered 7.5 mg dose tonight which has been reordered on transfer to rehab.  Will recheck INR with AM labs.    [employer], Pharm.D., BCPS Clinical Pharmacist Pager [phone] 05/09/2012 7:07 PM

## 2012-05-09 NOTE — PMR Pre-admission (Signed)
PMR Admission Coordinator Pre-Admission Assessment  Patient: Jon [name] is an 47 y.o., male MRN: [id number] DOB: [birth year] Height: 5\' 11"  (180.3 cm) Weight: 84.414 kg (186 lb 1.6 oz)             Insurance Information Medicaid Potential  Medicaid Application Date:       Case Manager:  Disability Application Date:       Case Worker:   Emergency Conservator, museum/gallery Information    Name Relation Home Work Mobile   Diem,Loveda Spouse   [phone]     Current Medical History  Patient Admitting Diagnosis: Cerebellar and occipital lobe infarcts  History of Present Illness: 47 y.o. right-handed male with history of aortic stenosis since childhood, recent type A dissection requiring emergent repair and maintained on chronic Coumadin was recently discharged from the hospital 05/03/2012. Patient was readmitted 05/05/2012 after being found nonresponsive by his wife and diaphoretic. After about 2-3 minutes he came around. He did not recall the period of time he was unresponsive. There was no report of seizure. He was having complaints of difficulty focusing on objects. MRI of the brain showed new punctate infarct within the left cerebellum question a few other punctate infarcts in the occipital lobes. Cardiac enzymes were negative. Echocardiogram showed ejection fraction of 40% with hypokinesis of the entire anteroseptal myocardium. MRA of head and neck and EEG are pending. Bouts of bradycardia into the 30s while sleeping of which remained asymptomatic and followup per cardiology services and metoprolol was adjusted. Patient remains on chronic Coumadin therapy as prior to hospital admission. Hospital course right pleural effusion underwent thoracentesis 05/08/2011 with 1.8 L of blood-tinged fluid removed. Followup chest x-ray with no pneumothorax. Patient continues to have issues in relation to focusing and limited vision he was seen by ophthalmology while hospitalized on 05/01/2012  exam was essentially normal with possibility of refraction.  Total: 2 =NIH   Past Medical History  Past Medical History  Diagnosis Date  . Anxiety   . Bicuspid aortic valve   . Aortic stenosis     mechanical AVR 04/22/12  . Shortness of breath   . Dysrhythmia   . Thoracic aortic aneurysm     replacement aortic root 04/22/12  . CVA (cerebral infarction)     occipital CVA 12/13   Family History  family history includes Diabetes in his father; Hypertension in his father; Lung disease in his mother; and Peripheral Artery Disease in his father.  Prior Rehab/Hospitalizations: none  Current Medications  Current facility-administered medications:0.9 %  sodium chloride infusion, 250 mL, Intravenous, PRN, Lars Mage, MD;  acetaminophen (TYLENOL) suppository 650 mg, 650 mg, Rectal, Q6H PRN, Lars Mage, MD;  acetaminophen (TYLENOL) tablet 650 mg, 650 mg, Oral, Q6H PRN, Lars Mage, MD;  albuterol (PROVENTIL) (5 MG/ML) 0.5% nebulizer solution 2.5 mg, 2.5 mg, Nebulization, Q2H PRN, Ripudeep K Rai, MD amiodarone (PACERONE) tablet 200 mg, 200 mg, Oral, BID, Lars Mage, MD, 200 mg at 05/09/12 1024;  aspirin chewable tablet 81 mg, 81 mg, Oral, Daily, Vesta Mixer, MD, 81 mg at 05/09/12 1025;  chlorpheniramine-HYDROcodone (TUSSIONEX) 10-8 MG/5ML suspension 5 mL, 5 mL, Oral, Q12H PRN, Roma Kayser Schorr, NP;  docusate sodium (COLACE) capsule 100 mg, 100 mg, Oral, BID, Lars Mage, MD, 100 mg at 05/09/12 1025 heparin ADULT infusion 100 units/mL (25000 units/250 mL), 1,800 Units/hr, Intravenous, Continuous, Dennie Fetters, [facility], Last Rate: 18 mL/hr at 05/09/12 1106, 1,800 Units/hr at 05/09/12 1106;  ipratropium (ATROVENT) nebulizer solution 0.5 mg, 0.5 mg,  Nebulization, Q2H PRN, Ripudeep K Rai, MD;  oxyCODONE-acetaminophen (PERCOCET/ROXICET) 5-325 MG per tablet 1 tablet, 1 tablet, Oral, Q4H PRN, Roma Kayser Schorr, NP, 1 tablet at 05/09/12 0320 pantoprazole (PROTONIX) EC tablet 40 mg, 40 mg, Oral, Daily,  Ankit Garg, MD, 40 mg at 05/09/12 1026;  RESOURCE THICKENUP CLEAR, , Oral, PRN, Rhetta Mura, MD;  sodium chloride 0.9 % injection 3 mL, 3 mL, Intravenous, Q12H, Ankit Garg, MD, 3 mL at 05/07/12 1018;  sodium chloride 0.9 % injection 3 mL, 3 mL, Intravenous, PRN, Lars Mage, MD, 3 mL at 05/08/12 1627 warfarin (COUMADIN) tablet 7.5 mg, 7.5 mg, Oral, ONCE-1800, Dennie Fetters, [facility];  Warfarin - Pharmacist Dosing Inpatient, , Does not apply, q1800, Fredrik Rigger, PHARMD  Patients Current Diet: Cardiac  Precautions / Restrictions Precautions Precautions: Fall Precaution Comments: Educated pt and spouse extensively in falls precaution, components of balance testing, vestibular and propriception function in balance.  Restrictions Weight Bearing Restrictions: No   Prior Activity Level Community (5-7x/wk): Active daily Home Assistive Devices / Equipment Home Assistive Devices/Equipment: Dan Humphreys (specify type);Shower chair with back  Prior Functional Level Prior Function Level of Independence: Independent Able to Take Stairs?: Yes Driving: Yes Vocation: Full time employment Comments: Curator  Current Functional Level Cognition  Arousal/Alertness: Awake/alert Overall Cognitive Status: Appears within functional limits for tasks assessed/performed Orientation Level: Oriented X4    Extremity Assessment (includes Sensation/Coordination)  RUE ROM/Strength/Tone: Within functional levels RUE Sensation: WFL - Light Touch RUE Coordination: WFL - fine motor (gross motor deficits)  RLE ROM/Strength/Tone: WFL for tasks assessed RLE Sensation: WFL - Light Touch;WFL - Proprioception RLE Coordination: WFL - gross/fine motor    ADLs  Grooming: Min guard Where Assessed - Grooming: Supported standing Upper Body Dressing: Minimal assistance Where Assessed - Upper Body Dressing: Supported sitting Lower Body Dressing: Minimal assistance Where Assessed - Lower Body Dressing: Supported  sit to Pharmacist, hospital: Hydrographic surveyor Method: Sit to Barista: Materials engineer and Hygiene: Minimal assistance Where Assessed - Engineer, mining and Hygiene: Standing Equipment Used: Gait belt Transfers/Ambulation Related to ADLs: Min A with ambulation as pt impulsive and occasionally unsteady    Mobility  Bed Mobility: Supine to Sit Supine to Sit: 7: Independent Sit to Supine: 5: Supervision;HOB flat    Transfers  Transfers: Sit to Stand;Stand to Sit Sit to Stand: 4: Min guard;With armrests;From chair/3-in-1 Stand to Sit: 4: Min guard;With armrests;To chair/3-in-1    Ambulation / Gait / Stairs / Psychologist, prison and probation services  Ambulation/Gait Ambulation/Gait Assistance: 4: Min guard Ambulation Distance (Feet): 100 Feet (100 feet with RW 100 feet with none.) Assistive device: Rolling walker;None Ambulation/Gait Assistance Details: Gait with RW is steady.  Gait with no AD unsteady pt presents with mild lateral sway.  Sway increased with cervical rotation, flexion and extension.  Pt had multiple LOB episodes but able to recover independently   Gait Pattern: Wide base of support;Decreased stride length Stairs: No Wheelchair Mobility Wheelchair Mobility: No    Posture / Balance Static Standing Balance Static Standing - Balance Support: During functional activity;No upper extremity supported Static Standing - Level of Assistance: 5: Stand by assistance Static Standing - Comment/# of Minutes: 30 seconds static standing with mild A-P sway Tandem Stance - Right Leg: 10  Tandem Stance - Left Leg: 10  Dynamic Gait Index Level Surface: Mild Impairment Change in Gait Speed: Mild Impairment Gait with Horizontal Head Turns: Moderate Impairment Gait with Vertical Head Turns: Mild Impairment  Gait and Pivot Turn: Mild Impairment Step Over Obstacle: Mild Impairment Step Around Obstacles: Mild Impairment Steps:  Normal Total Score: 16     Special needs/care consideration BiPAP/CPAP - no CPM - no Continuous Drip IV - yes: Heparin Dialysis - no        Days Life Vest - no Oxygen - no Special Bed - no Trach Size - no Wound Vac (area) - no      Location Skin- Incisions: sternum, (R)chest, (R) thigh                              Bowel mgmt: WNL  LBM 05/08/12 Bladder mgmt:WNL Diabetic mgmt -no   Previous Home Environment Living Arrangements: Spouse/significant other Lives With: Spouse Available Help at Discharge: Family Type of Home: House Home Layout: One level Home Access: Stairs to enter Entrance Stairs-Rails: Doctor, general practice of Steps: 4 Bathroom Shower/Tub: Counselling psychologist: Yes Home Care Services: No  Discharge Living Setting Plans for Discharge Living Setting: Patient's home;Lives with (comment) (wife) Type of Home at Discharge: House Discharge Home Layout: One level Discharge Home Access: Stairs to enter Entrance Stairs-Rails: Can reach both Entrance Stairs-Number of Steps: 2 Discharge Bathroom Shower/Tub: Tub/shower unit Discharge Bathroom Toilet: Standard Do you have any problems obtaining your medications?: Yes (Describe) (need assistance)  Social/Family/Support Systems Patient Roles: Spouse Contact Information: [phone] Anticipated Caregiver: Wife: Travante Knee Anticipated Caregiver's Contact Information: [phone] Ability/Limitations of Caregiver: none Caregiver Availability: 24/7 Discharge Plan Discussed with Primary Caregiver: Yes Is Caregiver In Agreement with Plan?: Yes Does Caregiver/Family have Issues with Lodging/Transportation while Pt is in Rehab?: No  Goals/Additional Needs Patient/Family Goal for Rehab: PT&OT: S, ST: mod I Expected length of stay: 7-10 days Cultural Considerations: none Dietary Needs: regular, thick-liquids Equipment Needs: TBD Pt/Family Agrees to Admission and  willing to participate: Yes Program Orientation Provided & Reviewed with Pt/Caregiver Including Roles  & Responsibilities: Yes  Decrease burden of Care through IP rehab admission: n /a  Possible need for SNF placement upon discharge: no  Patient Condition: This patient's condition remains as documented in the Consult dated 05/08/12, in which the Rehabilitation Physician determined and documented that the patient's condition is appropriate for intensive rehabilitative care in an inpatient rehabilitation facility.  Preadmission Screen Completed By:  Meryl Dare, 05/09/2012 12:38 PM ______________________________________________________________________   Discussed status with Dr. [name] Kill on1/3/14 at 12:50 PM and received telephone approval for admission today.  Admission Coordinator:  Meryl Dare, time 12/55 PM Date  05/09/12

## 2012-05-09 NOTE — Progress Notes (Signed)
Speech Language Pathology Dysphagia Treatment Patient Details Name: Jon [name] MRN: [id number] DOB: 17-Jun-1965 Today's Date: 05/09/2012 Time: 0810-0840 SLP Time Calculation (min): 30 min  Assessment / Plan / Recommendation Clinical Impression  Treatment session focused on reinforcement of strategies and precautions, pharyngeal/laryngeal strengthening exercises. Pt admits that he consumed breakfast without supervision and coughed. Says "It was my fault, I took too big a sip.  I remembered after I started choking."  Verbal cues provided for pt to consider plan before taking a sip, Pt return demonstrated with 100% accuracy. Pt did require min verbal cues to fully tuck chin and sustain throughout swallow. Pt reports "I hate this, Id rather drink the thick stuff."  Pt requested we stop, with encouragement, complete 3 effortful swallows. Emergent awareness and selective attention deficits persist. Pt will benefit from CIR for dysphagia and cognitive therapy.     Diet Recommendation  Continue with Current Diet: Regular;Thin liquid    SLP Plan Continue with current plan of care   Pertinent Vitals/Pain NA   Swallowing Goals  SLP Swallowing Goals Patient will consume recommended diet without observed clinical signs of aspiration with: Minimal assistance;Supervision/safety Swallow Study Goal #1 - Progress: Progressing toward goal Patient will utilize recommended strategies during swallow to increase swallowing safety with: Minimal assistance;Supervision/safety Swallow Study Goal #2 - Progress: Progressing toward goal  General Temperature Spikes Noted: No Respiratory Status: Room air Behavior/Cognition: Alert;Impulsive;Decreased sustained attention Oral Cavity - Dentition: Adequate natural dentition Patient Positioning: Upright in bed  Oral Cavity - Oral Hygiene Does patient have any of the following "at risk" factors?: Other - dysphagia;Diet - patient on thickened liquids Patient is AT  RISK - Oral Care Protocol followed (see row info): Yes   Dysphagia Treatment Treatment focused on: Skilled observation of diet tolerance;Patient/family/caregiver education;Facilitation of pharyngeal phase;Utilization of compensatory strategies Treatment Methods/Modalities: Skilled observation;Differential diagnosis Patient observed directly with PO's: Yes Type of PO's observed: Thin liquids Feeding: Able to feed self Liquids provided via: Cup Oral Phase Signs & Symptoms: Anterior loss/spillage Pharyngeal Phase Signs & Symptoms: Immediate cough Type of cueing: Verbal Amount of cueing: Moderate   GO    Jon [name], [place] CCC-SLP [phone]  [name] 05/09/2012, 8:52 AM

## 2012-05-09 NOTE — Progress Notes (Signed)
Patient admitted to 4145 via wheelchair accompanied by family. CMS data sheet N/A as self pay. Oriented to unit, bed control. White board, therapy schedules, team conference, lead nursing, rehab goal, preferred name, safety plan, agreement, video. Please see CHl for details of assessment. Patient has heparin drip infusing at 18 cc/hr with NS at 20cc/hr into right forearm Iv. Roberts-VonCannon, Jon [name]

## 2012-05-09 NOTE — Progress Notes (Signed)
Physical Therapy Treatment Patient Details Name: Jon [name] MRN: [id number] DOB: 12/15/1965 Today's Date: 05/09/2012 Time: 7829-5621 PT Time Calculation (min): 14 min  PT Assessment / Plan / Recommendation Comments on Treatment Session  Pt progressing well with mobility but remains to have vision deficits, balance impairements, impuilsivity and decreased safety awareness. Pt and spouse educated strongly on importance of calling for assist when pt desires to return to bed. Pt to con't to benefit from CIR for maximal functional recovery for safe transition home.    Follow Up Recommendations  CIR;Supervision/Assistance - 24 hour     Does the patient have the potential to tolerate intense rehabilitation     Barriers to Discharge        Equipment Recommendations       Recommendations for Other Services    Frequency Min 3X/week   Plan Discharge plan remains appropriate;Frequency remains appropriate    Precautions / Restrictions Precautions Precautions: Fall Restrictions Weight Bearing Restrictions: No   Pertinent Vitals/Pain 0/10. Pt c/o poor vision    Mobility  Bed Mobility Bed Mobility: Not assessed Transfers Transfers: Sit to Stand;Stand to Sit Sit to Stand: 4: Min guard;With armrests;From chair/3-in-1 Stand to Sit: 4: Min guard;With armrests;To chair/3-in-1 Details for Transfer Assistance: Min guard A for safety Ambulation/Gait Ambulation/Gait Assistance: 4: Min guard Ambulation Distance (Feet): 200 Feet Assistive device: Rolling walker Ambulation/Gait Assistance Details: pt with improved walker safety however remains to require assist when negotiating around obstacles. Gait Pattern: Wide base of support;Decreased stride length Gait velocity: wfl General Gait Details: increased UE WBIng Stairs: No    Exercises     PT Diagnosis:    PT Problem List:   PT Treatment Interventions:     PT Goals Acute Rehab PT Goals Time For Goal Achievement: 05/16/12 PT  Goal: Sit to Stand - Progress: Progressing toward goal PT Goal: Stand to Sit - Progress: Progressing toward goal PT Goal: Ambulate - Progress: Progressing toward goal Additional Goals PT Goal: Additional Goal #1 - Progress: Progressing toward goal  Visit Information  Last PT Received On: 05/09/12 Assistance Needed: +1    Subjective Data  Subjective: "I'm going upstairs today."   Cognition  Overall Cognitive Status: Appears within functional limits for tasks assessed/performed Arousal/Alertness: Awake/alert Orientation Level: Appears intact for tasks assessed Behavior During Session: [facility] for tasks performed Cognition - Other Comments: pt easily distracted    Balance     End of Session PT - End of Session Equipment Utilized During Treatment: Gait belt Activity Tolerance: Patient tolerated treatment well Patient left: in chair;with call bell/phone within reach;with family/visitor present;Other (comment) Nurse Communication: Mobility status   GP     Marcene Brawn 05/09/2012, 1:53 PM  Lewis Shock, PT, DPT Pager #: [phone] Office #: [phone]

## 2012-05-09 NOTE — Discharge Summary (Signed)
Physician Discharge Summary  Patient ID: Jon [name] MRN: [id number] DOB/AGE: Mar 22, 1966 47 y.o.  Admit date: 05/05/2012 Discharge date: 05/09/2012  Primary Care Physician:  Renae Fickle, MD  Discharge Diagnoses:    . HCAP (healthcare-associated pneumonia)/ aspiration/dysphagia . Syncope . Aortic stenosis s/p mechanical AVR 04/22/12 . 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 Stroke, embolic   Post-op Atrial fibrillation   Pleural effusion s/p thoracentesis on 05/07/12    decondtioning   Consults:  Neurology/stroke service, Dr Pearlean Brownie                    Cardiology: Dr Elease Hashimoto                    In-patient rehab                         Discharge Medications:   Medication List     As of 05/09/2012 11:47 AM    STOP taking these medications         levofloxacin 500 MG tablet   Commonly known as: LEVAQUIN      metoprolol tartrate 25 MG tablet   Commonly known as: LOPRESSOR      traMADol 50 MG tablet   Commonly known as: ULTRAM      TAKE these medications         amiodarone 400 MG tablet   Commonly known as: PACERONE   Take 0.5 tablets (200 mg total) by mouth 2 (two) times daily.      dextromethorphan-guaiFENesin 30-600 MG per 12 hr tablet   Commonly known as: MUCINEX DM   Take 1 tablet by mouth 2 (two) times daily as needed. congestion      food thickener Powd   Commonly known as: RESOURCE THICKENUP CLEAR   Take 1 Can by mouth daily.      pantoprazole 40 MG tablet   Commonly known as: PROTONIX   Take 1 tablet (40 mg total) by mouth daily.      warfarin 5 MG tablet   Commonly known as: COUMADIN   Take 1 tablet (5 mg total) by mouth daily at 6 PM.        Current medications to continue: Heparin drip per pharmacy with coumadin until INR therapeutic (Mechanical valve with embolic CVA) ASA 81mg  added   Brief H and P: For complete details please refer to admission H and P, but in brief Patient is a 47 y.o. male with a PMHx of aortic  stenosis since childhood, recent Type A dissection requiring emergent repair who was recently d/c from the hospital on 12/28, post op course complicated by aspiration pneumonia and visual changes(occipital infarct per MRI) was transferred from [facility] to [facility] for an episode of syncope. According to the patient's family patient was apparently doing well until morning when suddenly he stopped responding and his eyes rolled upwards. Patient did not respond for about a minute. After repeated shaking patient started responding again. Patient broke out into a cold sweat at that time per patient's wife. No post ictal confusion was noted.    Hospital Course:  47 yo male with history of aortic stenosis/bicuspid aortic valve admitted 04/22/12 with type 1 aortic dissection requiring emergent surgical correction with replacement of the aortic root, placement of a mechanical aortic valve and single vessel CABG with SVG to RCA. His dissection extended to the left internal carotid artery. His hospital course was complicated by aspiration  pneumonia and visual changes felt to be due to small occipital CVA. He also had post-op atrial fibrillation but converted to NSR on amiodarone therapy before discharge. He was discharged home on 05/03/12 and then readmitted on 05/05/12 after a syncopal episode.  He had difficulty focusing on objects. He stated his vision had been off since surgery. He was seen by opthalmology while hospitalized on 12/26. Their exam was essentially normal with possibility of refraction. Patient states his vision will "come and go" and is most note ably compromised when changing focus from a far object to a near object (and vise versa).  Syncope with new cerebellar CVA -MRI 05/06/12 showed new punctate infarction of left cerebellum--representing ongoing Microemboli. Full stroke was done (see below), patient was followed by stroke service.   He was placed on ASA 81mg  daily with warfarin. -EEG  was done on 12/31 showed "borderline EEG could be suggestive of non-specific bilateral occipital lobe dysfunction, however the fact that the finding was only present during drowsiness does call into question the significance of this finding and this could represent a normal variant"  -2D ECHO 05/07/2012 results as indicated below.  - He was not a TPA candidate secondary to nonfocal exam.  HCAP/Aspiration/Dysphagia  -Speech/swallow evaluation was done and recommended regular consistency with thin liquids.  -Discontinued Levaquin--patient has been taking since approximately 04/26/2012, Sputum culture shows polymicrobial flora   Aortic stenosis status post mechanical AVR-04/22/12  -Continue warfarin, pharmacy dosing with heparin gtt, continue until INR therapeutic.   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 Per cardiology recommendations by Dr Elease Hashimoto- " If he remains in NSR for the next month or so, I would DC Amiodarone and restart low dose Metoprolol - which would help reduce shear stress and may prevent another spontaneous dissection".   Pleural effusion  Status post thoracentesis on 05/07/2012, unfortunately cannot determine fluid was exudative or transudative as no protein was sent.   Deconditioning: patient is accepted by in-patient rehab    Day of Discharge BP 118/70  Pulse 73  Temp 97.4 F (36.3 C) (Oral)  Resp 20  Ht 5\' 11"  (1.803 m)  Wt 84.414 kg (186 lb 1.6 oz)  BMI 25.96 kg/m2  SpO2 94%  Physical Exam: General: Alert and awake oriented x3 not in any acute distress. HEENT: anicteric sclera, pupils reactive to light and accommodation CVSRR, mechanical click Chest: clear to auscultation bilaterally, no wheezing rales or rhonchi Abdomen:  soft nontender, nondistended, normal bowel sounds, no organomegaly Extremities: no cyanosis, clubbing or edema noted bilaterally    The results of significant diagnostics from this hospitalization (including imaging, microbiology, ancillary and laboratory) are listed below for reference.    LAB RESULTS: Basic Metabolic Panel:  Lab 05/09/12 3086 05/08/12 0420 05/06/12 0550  NA 137 138 --  K 4.5 5.1 --  CL 102 103 --  CO2 28 29 --  GLUCOSE 93 92 --  BUN 10 14 --  CREATININE 0.69 0.86 --  CALCIUM 8.1* 8.0* --  MG -- -- 2.0  PHOS -- -- 4.0   Liver Function Tests:  Lab 05/06/12 0550  AST 36  ALT 248*  ALKPHOS 188*  BILITOT 0.7  PROT 5.0*  ALBUMIN 2.1*   CBC:  Lab 05/09/12 0718 05/08/12 0420  WBC 10.8* 12.1*  NEUTROABS -- 9.6*  HGB 9.4* 9.4*  HCT 29.2* 29.5*  MCV 96.1 --  PLT 330 337   Cardiac Enzymes:  Lab 05/07/12 0655 05/07/12 0037 05/06/12 1852  CKTOTAL -- -- 63  CKMB -- -- 3.8  CKMBINDEX -- -- --  TROPONINI <0.30 <0.30 --    US Thoracentesis Asp Pleural Space W/img Guide 05/08/2012 Successful ultrasound guided diagnostic and therapeutic right thoracentesis yielding 1.8 liters of pleural fluid.  CT of the brain 04/25/2012 No acute intracranial abnormality.  MRI of the brain 05/06/2012 Definite new punctate infarction within the left cerebellum. Question a few other punctate infarctions in the occipital lobes, though the findings are at the borderline of detectability. The findings raise the possibility of ongoing posterior circulation micro emboli.  MRA of the brain 05/08/2012 1. Decreased flow in the distal right vertebral artery in keeping with the neck MRA findings. Flow at the right PICA origin appears preserved. 2. Otherwise negative posterior circulation, and otherwise negative intracranial MRA.  MRA of the neck 05/08/2012 1. Intermittently irregular appearance and decreased flow in the right vertebral artery throughout the neck. The findings in this setting could  reflect right vertebral artery thromboembolic disease or vertebral artery dissection. Neck CTA may characterize further as clinically necessary. 2. Otherwise negative noncontrast neck MRA. 3. Intracranial findings are below. 4. Layering pleural effusions.  2D Echocardiogram EF 35-40% with no source of embolus. hypokinesis of the entireanteroseptal myocardium. Septal motion showed paradox.  Carotid Doppler See MRA of the neck  CXR 05/07/2012 1. Decreased size of right pleural effusion. No pneumothorax identified. 2. Persistent bibasilar atelectasis versus infiltrates. 3. Stable cardiomegaly.       Disposition and Follow-up:     Discharge Orders    Future Appointments: Provider: Department: Dept Phone: Center:   05/27/2012 9:45 AM [name], MD [facility] [phone] T[facility]       DISPOSITION: in-patient rehab  DIET:heart healthy  ACTIVITY: as tolerated   DISCHARGE FOLLOW-UP Follow-up Information    Follow up with SETHI,PRAMODKUMAR P, MD. In 2 months. (for stroke follow-up)    Contact information:   [address], [address] [facility] [place] [address] [phone]          Time spent on Discharge: 40 mins  Signed:   RAI,RIPUDEEP M.D. [facility] 05/09/2012, 11:47 AM Pager: [phone]

## 2012-05-09 NOTE — Progress Notes (Signed)
Admitting pt to CIR today. Met with patient and spoke with wife by phone to discuss CIR. Pt would benefit from inpatient rehab and is in agreement with plan. Dr Isidoro Donning reports pt is medically ready to d/c to CIR. For questions:[phone]

## 2012-05-09 NOTE — Progress Notes (Signed)
ANTICOAGULATION CONSULT NOTE - Follow Up Consult  Pharmacy Consult for Heparin and Coumadin Indication: recent mechanical AVR/ post-op afb. new CVA this admit  Allergies  Allergen Reactions  . Dilaudid (Hydromorphone Hcl) Other (See Comments)    High doses cause itching  . Penicillins Other (See Comments)    Unknown    Patient Measurements: Height: 5\' 11"  (180.3 cm) Weight: 186 lb 1.6 oz (84.414 kg) IBW/kg (Calculated) : 75.3  Heparin Dosing Weight: 84.4 kg  Vital Signs: Temp: 97.4 F (36.3 C) (01/03 0800) Temp src: Oral (01/03 0800) BP: 118/70 mmHg (01/03 0800) Pulse Rate: 73  (01/03 0800)  Labs:  Alvira Philips 05/09/12 0955 05/09/12 0718 05/08/12 2215 05/08/12 0420 05/07/12 0655 05/07/12 0045 05/07/12 0037 05/06/12 1852  HGB -- 9.4* -- 9.4* -- -- -- --  HCT -- 29.2* -- 29.5* -- 29.7* -- --  PLT -- 330 -- 337 -- 301 -- --  APTT -- -- -- -- -- -- -- --  LABPROT -- 19.1* -- 18.2* -- 18.6* -- --  INR -- 1.66* -- 1.56* -- 1.61* -- --  HEPARINUNFRC 0.15* -- <0.10* -- -- -- -- --  CREATININE -- 0.69 -- 0.86 -- 0.95 -- --  CKTOTAL -- -- -- -- -- -- -- 63  CKMB -- -- -- -- -- -- -- 3.8  TROPONINI -- -- -- -- <0.30 -- <0.30 0.30*    Estimated Creatinine Clearance: 122.9 ml/min (by C-G formula based on Cr of 0.69).  Assessment:   Heparin added 1/2 per Neuro for bridging. New small stroke with subtherapeutic INR.   Heparin level remains suptherapeutic on 1500 units/hr.   INR increasing some, CBC low stable.  S/p thoracentesis 1/1. Has had Coumadin 7.5 mg daily x 2 days.  Home Coumadin regimen: 5 mg daily.  Continues on Amiodarone 200 mg BID. Last Levaquin dose 1/1.    Goal of Therapy:  INR 2-3 Heparin level 0.3-0.5 units/ml (lower end of therapeutic range) Monitor platelets by anticoagulation protocol: Yes   Plan:   Further increase heparin to 1800 units/hr.  Next heparin level ~ 6 hours after increase.  Repeat Coumadin 7.5 mg today.  Continue daily heparin level, PT/INR  and CBC.  Dennie Fetters, RPh Pager: [phone] 05/09/2012,11:10 AM

## 2012-05-09 NOTE — Progress Notes (Signed)
Clinical Social Worker staffed case with CIR.  Pt to dc to CIR today. CSW to sign off, please re consult if needed.   Angelia Mould, MSW, [place] [phone]

## 2012-05-09 NOTE — Progress Notes (Signed)
ANTICOAGULATION CONSULT NOTE - Follow Up Consult  Pharmacy Consult for heparin Indication: AVR with post-op Afib  Labs:  Basename 05/08/12 2215 05/08/12 0420 05/07/12 0655 05/07/12 0045 05/07/12 0037 05/06/12 1852 05/06/12 0550  HGB -- 9.4* -- 9.6* -- -- --  HCT -- 29.5* -- 29.7* -- -- 26.2*  PLT -- 337 -- 301 -- -- 270  APTT -- -- -- -- -- -- --  LABPROT -- 18.2* -- 18.6* -- -- 21.7*  INR -- 1.56* -- 1.61* -- -- 1.98*  HEPARINUNFRC <0.10* -- -- -- -- -- --  CREATININE -- 0.86 -- 0.95 -- -- 0.82  CKTOTAL -- -- -- -- -- 63 --  CKMB -- -- -- -- -- 3.8 --  TROPONINI -- -- <0.30 -- <0.30 0.30* --    Assessment: 47yo male undetectable on heparin with initial dosing for AVR with post-op Afib.  Goal of Therapy:  Heparin level 0.3-0.7 units/ml   Plan:  Will increase heparin gtt by 4 units/kg/hr to 1500 units/hr and check level in 6hr.  Colleen Can PharmD BCPS 05/09/2012,12:28 AM

## 2012-05-09 NOTE — Progress Notes (Signed)
Stroke education reviewed with patient and family at bedside. Discharge NIH also completed and 4 pm neuro check also done.

## 2012-05-09 NOTE — Progress Notes (Signed)
STROKE/TIA DISCHARGE INSTRUCTIONS SMOKING Cigarette smoking nearly doubles your risk of having a stroke & is the single most alterable risk factor  If you smoke or have smoked in the last 12 months, you are advised to quit smoking for your health.  Most of the excess cardiovascular risk related to smoking disappears within a year of stopping.  Ask you doctor about anti-smoking medications  [place] Quit Line: 1-800-QUIT NOW  Free Smoking Cessation Classes [phone]  CHOLESTEROL Know your levels; limit fat & cholesterol in your diet  Lipid Panel     Component Value Date/Time   CHOL 114 05/08/2012 0420   TRIG 54 05/08/2012 0420   HDL 34* 05/08/2012 0420   CHOLHDL 3.4 05/08/2012 0420   VLDL 11 05/08/2012 0420   LDLCALC 69 05/08/2012 0420      Many patients benefit from treatment even if their cholesterol is at goal.  Goal: Total Cholesterol (CHOL) less than 160  Goal:  Triglycerides (TRIG) less than 150  Goal:  HDL greater than 40  Goal:  LDL (LDLCALC) less than 100   BLOOD PRESSURE American Stroke Association blood pressure target is less that 120/80 mm/Hg  Your discharge blood pressure is:  BP: 107/54 mmHg  Monitor your blood pressure  Limit your salt and alcohol intake  Many individuals will require more than one medication for high blood pressure  DIABETES (A1c is a blood sugar average for last 3 months) Goal HGBA1c is under 7% (HBGA1c is blood sugar average for last 3 months)  Diabetes: No known diagnosis of diabetes    Lab Results  Component Value Date   HGBA1C 5.0 05/08/2012     Your HGBA1c can be lowered with medications, healthy diet, and exercise.  Check your blood sugar as directed by your physician  Call your physician if you experience unexplained or low blood sugars.  PHYSICAL ACTIVITY/REHABILITATION Goal is 30 minutes at least 4 days per week  Activity: Increase activity slowly, Therapies:  Return to work:   Activity decreases your risk of heart attack and stroke  and makes your heart stronger.  It helps control your weight and blood pressure; helps you relax and can improve your mood.  Participate in a regular exercise program.  Talk with your doctor about the best form of exercise for you (dancing, walking, swimming, cycling).  DIET/WEIGHT Goal is to maintain a healthy weight  Your discharge diet is: Cardiac THIN liquids CHIN TUCK NO STRAWS SMALL BITES/SIPS MEDS IN PUREE Your height is:  Height: 5\' 11"  (180.3 cm) Your current weight is: Weight: 84.414 kg (186 lb 1.6 oz) Your Body Mass Index (BMI) is:  BMI (Calculated): 25.6   Following the type of diet specifically designed for you will help prevent another stroke.  Your goal weight range is:  136-172  Your goal Body Mass Index (BMI) is 19-24.  Healthy food habits can help reduce 3 risk factors for stroke:  High cholesterol, hypertension, and excess weight.  RESOURCES Stroke/Support Group:  Call [phone]   STROKE EDUCATION PROVIDED/REVIEWED AND GIVEN TO PATIENT Stroke warning signs and symptoms How to activate emergency medical system (call 911). Medications prescribed at discharge. Need for follow-up after discharge. Personal risk factors for stroke. Pneumonia vaccine given: No Flu vaccine given: No My questions have been answered, the writing is legible, and I understand these instructions.  I will adhere to these goals & educational materials that have been provided to me after my discharge from the hospital.

## 2012-05-10 ENCOUNTER — Inpatient Hospital Stay (HOSPITAL_COMMUNITY): Payer: Medicaid Other

## 2012-05-10 ENCOUNTER — Inpatient Hospital Stay (HOSPITAL_COMMUNITY): Payer: Medicaid Other | Admitting: Occupational Therapy

## 2012-05-10 ENCOUNTER — Inpatient Hospital Stay (HOSPITAL_COMMUNITY): Payer: Medicaid Other | Admitting: Speech Pathology

## 2012-05-10 DIAGNOSIS — R05 Cough: Secondary | ICD-10-CM

## 2012-05-10 LAB — CBC
HCT: 31 % — ABNORMAL LOW (ref 39.0–52.0)
Hemoglobin: 10 g/dL — ABNORMAL LOW (ref 13.0–17.0)
MCH: 30.5 pg (ref 26.0–34.0)
MCHC: 32.3 g/dL (ref 30.0–36.0)
MCV: 94.5 fL (ref 78.0–100.0)

## 2012-05-10 MED ORDER — ALBUTEROL SULFATE (5 MG/ML) 0.5% IN NEBU
2.5000 mg | INHALATION_SOLUTION | RESPIRATORY_TRACT | Status: DC | PRN
  Administered 2012-05-11 – 2012-05-15 (×5): 2.5 mg via RESPIRATORY_TRACT
  Filled 2012-05-10 (×5): qty 0.5

## 2012-05-10 MED ORDER — BENZONATATE 100 MG PO CAPS
200.0000 mg | ORAL_CAPSULE | Freq: Three times a day (TID) | ORAL | Status: DC | PRN
  Administered 2012-05-10 – 2012-05-15 (×4): 200 mg via ORAL
  Filled 2012-05-10 (×4): qty 2

## 2012-05-10 MED ORDER — IPRATROPIUM BROMIDE 0.02 % IN SOLN
0.5000 mg | Freq: Four times a day (QID) | RESPIRATORY_TRACT | Status: DC
  Administered 2012-05-10: 0.5 mg via RESPIRATORY_TRACT
  Filled 2012-05-10: qty 2.5

## 2012-05-10 MED ORDER — ALBUTEROL SULFATE (5 MG/ML) 0.5% IN NEBU
2.5000 mg | INHALATION_SOLUTION | Freq: Four times a day (QID) | RESPIRATORY_TRACT | Status: DC
  Administered 2012-05-10: 2.5 mg via RESPIRATORY_TRACT
  Filled 2012-05-10: qty 0.5

## 2012-05-10 MED ORDER — WARFARIN SODIUM 7.5 MG PO TABS
7.5000 mg | ORAL_TABLET | Freq: Once | ORAL | Status: AC
  Administered 2012-05-10: 7.5 mg via ORAL
  Filled 2012-05-10: qty 1

## 2012-05-10 NOTE — Evaluation (Signed)
Occupational Therapy Assessment and Plan  Patient Details  Name: Jon [name] MRN: [id number] Date of Birth: [birth year]  OT Diagnosis: cognitive deficits, disturbance of vision and muscle weakness (generalized) Rehab Potential: Rehab Potential: Good ELOS: 7 days   Today's Date: 05/10/2012 Time: 4098-1191 Time Calculation (min): 55 min  1:1 Pt seen for initial evaluation and ADL retraining with a focus on dynamic standing balance, safety awareness, and visual focus on environment.  Pt was very anxious to shower, but he has a continuous IV.  He needed quite a bit of redirection as he would focus on other things.  Pt's wife present for evaluation and very involved with the evaluation.  He needs steady assist with all activities.  Problem List:  Patient Active Problem List  Diagnosis  . HCAP (healthcare-associated pneumonia)  . Syncope  . Anxiety  . Aortic stenosis  . Aortic aneurysm and dissection  . Thoracic aortic aneurysm  . Stroke, embolic    Past Medical History:  Past Medical History  Diagnosis Date  . Anxiety   . Bicuspid aortic valve   . Aortic stenosis     mechanical AVR 04/22/12  . Shortness of breath   . Dysrhythmia   . Thoracic aortic aneurysm     replacement aortic root 04/22/12  . 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 Cardiac catheterization   . Rotator cuff repair     12 years ago, Left  . Thoracic aortic aneurysm repair 04/22/2012    Procedure: THORACIC ASCENDING ANEURYSM REPAIR (AAA);  Surgeon: [name], MD;  Location: [facility] OR;  Service: Open Heart Surgery;  Laterality: N/A;  . Coronary artery bypass graft 04/22/2012    Procedure: CORONARY ARTERY BYPASS GRAFTING (CABG);  Surgeon: [name], MD;  Location: [facility] OR;  Service: Open Heart Surgery;  Laterality: N/A;  times one with right greater saphenous vein graft  . Intraoperative transesophageal  echocardiogram 04/22/2012    Procedure: INTRAOPERATIVE TRANSESOPHAGEAL ECHOCARDIOGRAM;  Surgeon: [name], MD;  Location: [facility] OR;  Service: Open Heart Surgery;  Laterality: N/A;    Assessment & Plan Clinical Impression: Jon [name] is a 47 y.o. 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 Patient was readmitted 05/05/2012 after being found nonresponsive by his wife and diaphoretic. After about 2-3 minutes he came around. He did not recall the period of time he was unresponsive. There was no report of seizure. He was having complaints of difficulty focusing on objects. MRI of the brain showed new punctate infarct within the left cerebellum question a few other punctate infarcts in the occipital lobes. Cardiac enzymes were negative. Echocardiogram showed ejection fraction of 40% with hypokinesis of the entire anteroseptal myocardium. MRA of head and neck were unremarkable. EEG was pending. Bouts of bradycardia into the 30s while sleeping of which remained asymptomatic and followup per cardiology services and metoprolol was discontinued and remains on amiodarone. Patient remains on chronic Coumadin therapy as prior to hospital admission with intravenous heparin added until INR greater than 2.00 with latest INR of 1.66. Hospital course right pleural effusion underwent thoracentesis 05/08/2011 with 1.8 L of blood-tinged fluid removed. Followup chest x-ray with no pneumothorax. Patient continues to have issues in relation to focusing and limited vision as he was seen by ophthalmology while  hospitalized on 05/01/2012 exam was essentially normal with possibility of refraction. Physical and occupational therapy evaluations completed an ongoing. Noted issues in regards to being impulsive and limited awareness of deficits. Patient  transferred to CIR on 05/09/2012 .    Patient currently requires min with basic self-care skills secondary to muscle weakness, decreased cardiorespiratoy endurance, decreased visual acuity and decreased visual motor skills, decreased awareness and decreased safety awareness and decreased standing balance and decreased balance strategies.  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  Patient will benefit from skilled intervention to increase independence with basic self-care skills prior to discharge home with care partner.  Anticipate patient will require 24 hour supervision and follow up outpatient.  OT - End of Session Activity Tolerance: Tolerates 10 - 20 min activity with multiple rests Endurance Deficit: Yes Endurance Deficit Description: fatigued after Berg OT Assessment Rehab Potential: Good Barriers to Discharge: None OT Plan OT Intensity: Minimum of 1-2 x/day, 45 to 90 minutes OT Frequency: 5 out of 7 days OT Duration/Estimated Length of Stay: 7 days OT Treatment/Interventions: Balance/vestibular training;Cognitive remediation/compensation;Discharge planning;DME/adaptive equipment instruction;Functional mobility training;Patient/family education;Self Care/advanced ADL retraining;Therapeutic Activities;Therapeutic Exercise;Visual/perceptual remediation/compensation OT Recommendation Recommendations for Other Services: Neuropsych consult Patient destination: Home Follow Up Recommendations: Outpatient OT Equipment Recommended: Tub/shower bench  OT Evaluation Precautions/Restrictions  Precautions Precautions: Fall    Vital Signs Therapy Vitals Pulse Rate: 104  BP: 113/78 mmHg Patient Position, if appropriate: Sitting Oxygen Therapy SpO2: 95 % O2 Device: None (Room air) Pain Pain Assessment Pain Assessment: No/denies pain Home Living/Prior Functioning Home Living Lives With: Spouse Available Help  at Discharge: Family Type of Home: House Home Access: Stairs to enter (1 step/threshold) Home Layout: One level Prior Function Level of Independence: Independent with basic ADLs Able to Take Stairs?: Yes Driving: Yes Vocation: Full time employment 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    Vision/Perception  Vision - History Baseline Vision: No visual deficits Patient Visual Report: Blurring of vision;Unable to keep objects in focus;Eye fatigue/eye pain/headache Vision - Assessment Eye Alignment: Within Functional Limits Vision Assessment: Vision tested Ocular Range of Motion: Restricted looking up Alignment/Gaze Preference: Other (comment) (Gaze down) Tracking/Visual Pursuits: Decreased smoothness of horizontal tracking Saccades: Decreased speed of saccadic movement;Additional head turns occurred during testing Visual Fields: Other (comment) (to be evaluated further) Depth Perception: impaired per patient's wife report, did not observe during ADLs, to be evaluated further  Additional Comments: c/o eye fatigue Perception Perception: Within Functional Limits Praxis Praxis: Intact  Cognition Overall Cognitive Status: Appears within functional limits for tasks assessed Arousal/Alertness: Awake/alert Orientation Level: Oriented to person;Oriented to place;Oriented to situation Attention: Sustained Sustained Attention: Impaired Sustained Attention Impairment: Functional basic Awareness: Impaired Awareness Impairment: Intellectual impairment;Emergent impairment Behaviors: Impulsive Safety/Judgment: Impaired Sensation Sensation Light Touch: Appears Intact Stereognosis: Appears Intact Hot/Cold: Appears Intact Proprioception: Appears Intact Coordination Gross Motor Movements are Fluid and Coordinated: Yes Fine Motor Movements are Fluid and Coordinated: Yes Heel Shin Test: bil intact Motor  Motor Motor: Hemiplegia;Motor impersistence Motor - Skilled  Clinical Observations: mild weakness L hip Mobility  Bed Mobility Supine to Sit: 7: Independent Sit to Supine: 7: Independent Transfers Sit to Stand: 4: Min guard Stand to Sit: 4: Min guard  Trunk/Postural Assessment  Cervical Assessment Cervical Assessment: Within Functional Limits Thoracic Assessment Thoracic Assessment: Within Functional Limits Lumbar Assessment Lumbar Assessment: Within Functional Limits Postural Control Postural Control: Deficits on evaluation Protective Responses: delayed and inadequate  Balance Balance Balance Assessed: Yes Standardized Balance Assessment Standardized Balance Assessment: Berg Balance Test Berg Balance Test Sit to Stand: Able to stand without using hands and stabilize independently Standing Unsupported: Able to stand 2 minutes with supervision Sitting with Back Unsupported but Feet Supported on Floor or Stool: Able to sit safely and securely 2 minutes Stand to Sit: Sits safely with minimal use of hands Transfers: Able to transfer safely, minor use of hands Standing Unsupported with Eyes Closed: Able to stand 10 seconds with supervision (SOB during this task) Standing Ubsupported with Feet Together: Able to place feet together independently and stand 1 minute safely From Standing, Reach Forward with Outstretched Arm: Can reach confidently >25 cm (10") From Standing Position, Pick up Object from Floor: Able to pick up shoe safely and easily From Standing Position, Turn to Look Behind Over each Shoulder: Looks behind from both sides and weight shifts well Turn 360 Degrees: Able to turn 360 degrees safely in 4 seconds or less Standing Unsupported, Alternately Place Feet on Step/Stool: Able to complete >2 steps/needs minimal assist Standing Unsupported, One Foot in Front: Needs help to step but can hold 15 seconds Standing on One Leg: Unable to try or needs assist to prevent fall Total Score: 44  Static Standing Balance Static Standing -  Level of Assistance: 5: Stand by assistance Dynamic Standing Balance Dynamic Standing - Level of Assistance: 4: Min assist Dynamic Standing - Balance Activities: Reaching across midline;Forward lean/weight shifting;Lateral lean/weight shifting Extremity/Trunk Assessment RUE Assessment RUE Assessment: Within Functional Limits LUE Assessment LUE Assessment: Within Functional Limits  See FIM for current functional status Refer to Care Plan for Long Term Goals  Recommendations for other services: None  Discharge Criteria: Patient will be discharged from OT if patient refuses treatment 3 consecutive times without medical reason, if treatment goals not met, if there is a change in medical status, if patient makes no progress towards goals or if patient is discharged from hospital.  The above assessment, treatment plan, treatment alternatives and goals were discussed and mutually agreed upon: by patient and by family  SAGUIER,JULIA 05/10/2012, 1:00 PM

## 2012-05-10 NOTE — Evaluation (Signed)
Speech Language Pathology Assessment and Plan  Patient Details  Name: Jon [name] MRN: [id number] Date of Birth: 1965/10/01  SLP Diagnosis: Dysphagia;Cognitive Impairments  Rehab Potential: Good ELOS: 5-7 days   Today's Date: 05/10/2012 Time: 4098-1191 Time Calculation (min): 70 min  Problem List:  Patient Active Problem List  Diagnosis  . HCAP (healthcare-associated pneumonia)  . Syncope  . Anxiety  . Aortic stenosis  . Aortic aneurysm and dissection  . Thoracic aortic aneurysm  . Stroke, embolic   Past Medical History:  Past Medical History  Diagnosis Date  . Anxiety   . Bicuspid aortic valve   . Aortic stenosis     mechanical AVR 04/22/12  . Shortness of breath   . Dysrhythmia   . Thoracic aortic aneurysm     replacement aortic root 04/22/12  . 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 Cardiac catheterization   . Rotator cuff repair     12 years ago, Left  . Thoracic aortic aneurysm repair 04/22/2012    Procedure: THORACIC ASCENDING ANEURYSM REPAIR (AAA);  Surgeon: [name], MD;  Location: [facility] OR;  Service: Open Heart Surgery;  Laterality: N/A;  . Coronary artery bypass graft 04/22/2012    Procedure: CORONARY ARTERY BYPASS GRAFTING (CABG);  Surgeon: [name], MD;  Location: [facility] OR;  Service: Open Heart Surgery;  Laterality: N/A;  times one with right greater saphenous vein graft  . Intraoperative transesophageal echocardiogram 04/22/2012    Procedure: INTRAOPERATIVE TRANSESOPHAGEAL ECHOCARDIOGRAM;  Surgeon: [name], MD;  Location: [facility] OR;  Service: Open Heart Surgery;  Laterality: N/A;    Assessment / Plan / Recommendation Clinical Impression  Jon [name] is a 47 y.o. right-handed male with history of aortic stenosis since childhood, recent type A dissection requiring emergent repair 04/22/2012; was maintained on chronic Coumadin and  recently discharged from the hospital 05/03/2012. Patient was readmitted 05/05/2012 after being found nonresponsive by his wife and diaphoretic. After about 2-3 minutes he came around. He did not recall the period of time he was unresponsive. There was no report of seizure. He was having complaints of difficulty focusing on objects. MRI of the brain showed new punctate infarct within the left cerebellum question a few other punctate infarcts in the occipital lobes. Cardiac enzymes were negative. Echocardiogram showed ejection fraction of 40% with hypokinesis of the entire anteroseptal myocardium. MRA of head and neck were unremarkable. Bouts of bradycardia into the 30s while sleeping of which remained asymptomatic and follow-up per cardiology services and metoprolol was discontinued and remains on amiodarone. Patient remains on chronic Coumadin therapy as prior to hospital admission with intravenous heparin added until INR greater than 2.00 with latest INR of 1.66. Hospital course right pleural effusion underwent thoracentesis 05/08/2011 with 1.8 L of blood-tinged fluid removed. Follow-up chest x-ray with no pneumothorax. Patient continues to have issues in relation to focusing and limited vision as he was seen by ophthalmology while hospitalized on 05/01/2012 exam was essentially normal with possibility of refraction. 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  Patient transferred to [facility] rehabilitation services 05/09/12 and upon evaluation today presents with continued mild pharyngeal dysphagia and cognitive deficits characterized by decreased sustained attention to task and topic, decreased awareness and insight into deficits as well as decreased self-monitoring and correcting.  As a result, this patient would benefit from skilled SLP services during CIR  stay to address deficits, maximize functional independence and reduce burden of care prior to discharge home with  wife.     SLP Assessment  Patient will need skilled Speech Lanaguage Pathology Services during CIR admission    Recommendations  Diet Recommendations: Regular;Thin liquid Liquid Administration via: Cup;No straw Medication Administration: Whole meds with puree Supervision: Patient able to self feed;Full supervision/cueing for compensatory strategies Compensations: Slow rate;Small sips/bites Postural Changes and/or Swallow Maneuvers: Chin tuck;Seated upright 90 degrees Oral Care Recommendations: Oral care BID Recommendations for Other Services: Neuropsych consult Patient destination: Home Follow up Recommendations: 24 hour supervision/assistance;Outpatient SLP Equipment Recommended: None recommended by SLP    SLP Frequency 5 out of 7 days   SLP Treatment/Interventions Cognitive remediation/compensation;Cueing hierarchy;Dysphagia/aspiration precaution training;Environmental controls;Functional tasks;Internal/external aids;Patient/family education;Therapeutic Activities;Therapeutic Exercise    Pain Pain Assessment Pain Assessment: No/denies pain Prior Functioning Cognitive/Linguistic Baseline: Within functional limits  Short Term Goals: Week 1: SLP Short Term Goal 1 (Week 1): Short term goals = long term goals  See FIM for current functional status Refer to Care Plan for Long Term Goals  Recommendations for other services: Neuropsych  Discharge Criteria: Patient will be discharged from SLP if patient refuses treatment 3 consecutive times without medical reason, if treatment goals not met, if there is a change in medical status, if patient makes no progress towards goals or if patient is discharged from hospital.  The above assessment, treatment plan, treatment alternatives and goals were discussed and mutually agreed upon: by patient and by family  Charlane Ferretti., CCC-SLP [phone]  Alexyia Guarino 05/10/2012, 3:52 PM

## 2012-05-10 NOTE — Progress Notes (Signed)
Complaint.  of congestion and not feeling well. Wife requested to talk to md  Dr plotnikov came in to speak to her. Orders written

## 2012-05-10 NOTE — Progress Notes (Signed)
C/o cough with blood tinged sputum x 1-2 d No new CP or SOB He quit smoking 1 month ago  HPI: Jon [name] is a 47 y.o. 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 Patient was readmitted 05/05/2012 after being found nonresponsive by his wife and diaphoretic. After about 2-3 minutes he came around. He did not recall the period of time he was unresponsive. There was no report of seizure. He was having complaints of difficulty focusing on objects. MRI of the brain showed new punctate infarct within the left cerebellum question a few other punctate infarcts in the occipital lobes. Cardiac enzymes were negative. Echocardiogram showed ejection fraction of 40% with hypokinesis of the entire anteroseptal myocardium. MRA of head and neck were unremarkable. EEG was pending. Bouts of bradycardia into the 30s while sleeping of which remained asymptomatic and followup per cardiology services and metoprolol was discontinued and remains on amiodarone. Patient remains on chronic Coumadin therapy as prior to hospital admission with intravenous heparin added until INR greater than 2.00 with latest INR of 1.66. Hospital course right pleural effusion underwent thoracentesis 05/08/2011 with 1.8 L of blood-tinged fluid removed. Followup chest x-ray with no pneumothorax. Patient continues to have issues in relation to focusing and limited vision as he was seen by ophthalmology while hospitalized on 05/01/2012 exam was essentially normal with possibility of refraction. Physical and occupational therapy evaluations completed an ongoing. Noted issues in regards to being impulsive and limited awareness of deficits. M.D./PT/OT have requested physical medicine rehabilitation consult to consider inpatient rehabilitation services. Patient was felt to be a good  candidate for inpatient rehabilitation services was admitted for a comprehensive rehabilitation program  Review of Systems  Eyes: Positive for blurred vision and double vision.  Gastrointestinal: Positive for nausea and vomiting.  Neurological: Positive for weakness.  All other systems reviewed and are negative  Past Medical History   Diagnosis  Date   .  Anxiety    .  Bicuspid aortic valve    .  Aortic stenosis      mechanical AVR 04/22/12   .  Shortness of breath    .  Dysrhythmia    .  Thoracic aortic aneurysm      replacement aortic root 04/22/12   .  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  Cardiac catheterization    .  Rotator cuff repair      12 years ago, Left   .  Thoracic aortic aneurysm repair  04/22/2012     Procedure: THORACIC ASCENDING ANEURYSM REPAIR (AAA); Surgeon: [name], MD; Location: [facility] OR; Service: Open Heart Surgery; Laterality: N/A;   .  Coronary artery bypass graft  04/22/2012     Procedure: CORONARY ARTERY BYPASS GRAFTING (CABG); Surgeon: [name], MD; Location: [facility] OR; Service: Open Heart Surgery; Laterality: N/A; times one with right greater saphenous vein graft   .  Intraoperative transesophageal echocardiogram  04/22/2012     Procedure: INTRAOPERATIVE TRANSESOPHAGEAL ECHOCARDIOGRAM; Surgeon: [name], MD; Location: [facility] OR; Service: Open Heart Surgery; Laterality: N/A;    Family History   Problem  Relation  Age of Onset   .  Lung disease  Mother    .  Diabetes  Father    .  Peripheral Artery Disease  Father    .  Hypertension  Father    Social History: reports that he has been smoking Cigarettes. He has been smoking about 2.5 packs per day. He does not have any smokeless tobacco history on file. He reports that he drinks alcohol. He reports that he does not use illicit drugs.  Allergies:  Allergies   Allergen  Reactions   .  Dilaudid (Hydromorphone Hcl)   Other (See Comments)     High doses cause itching   .  Penicillins  Other (See Comments)     Unknown    Medications Prior to Admission   Medication  Sig  Dispense  Refill   .  amiodarone (PACERONE) 400 MG tablet  Take 0.5 tablets (200 mg total) by mouth 2 (two) times daily.  60 tablet  1   .  dextromethorphan-guaiFENesin (MUCINEX DM) 30-600 MG per 12 hr tablet  Take 1 tablet by mouth 2 (two) times daily as needed. congestion     .  food thickener (RESOURCE THICKENUP CLEAR) POWD  Take 1 Can by mouth daily.     Marland Kitchen  levofloxacin (LEVAQUIN) 500 MG tablet  Take 1 tablet (500 mg total) by mouth daily.  4 tablet  0   .  metoprolol tartrate (LOPRESSOR) 25 MG tablet  Take 1 tablet (25 mg total) by mouth 2 (two) times daily.  60 tablet  1   .  pantoprazole (PROTONIX) 40 MG tablet  Take 1 tablet (40 mg total) by mouth daily.  30 tablet  1   .  traMADol (ULTRAM) 50 MG tablet  Take 50-100 mg by mouth every 6 (six) hours as needed. For pain     .  warfarin (COUMADIN) 5 MG tablet  Take 1 tablet (5 mg total) by mouth daily at 6 PM.  100 tablet  1   .  [DISCONTINUED] dextromethorphan-guaiFENesin (MUCINEX DM) 30-600 MG per 12 hr tablet  Take 1 tablet by mouth 2 (two) times daily as needed.  30 tablet  0   .  [DISCONTINUED] traMADol (ULTRAM) 50 MG tablet  Take 1-2 tablets (50-100 mg total) by mouth every 6 (six) hours as needed.  50 tablet  0   Home:  Home Living  Lives With: Spouse  Available Help at Discharge: Family  Type of Home: House  Home Access: Stairs to enter  Secretary/administrator of Steps: 4  Entrance Stairs-Rails: Right;Left  Home Layout: One level  Bathroom Shower/Tub: Sports administrator: Standard  Bathroom Accessibility: Yes  Functional History:  Prior Function  Able to Take Stairs?: Yes  Driving: Yes  Vocation: Full time employment  Comments: Publishing copy Status:  Mobility:  Bed Mobility  Bed Mobility: Supine to Sit  Supine to Sit: 7: Independent    Sit to Supine: 5: Supervision;HOB flat  Transfers  Transfers: Sit to Stand;Stand to Sit  Sit to Stand: 4: Min guard;With armrests;From chair/3-in-1  Stand to Sit: 4: Min guard;With armrests;To chair/3-in-1  Ambulation/Gait  Ambulation/Gait Assistance: 4: Min guard  Ambulation Distance (Feet): 100 Feet (100 feet with RW 100 feet with none.)  Assistive device: Rolling walker;None  Ambulation/Gait Assistance Details: Gait with RW is steady. Gait with no AD unsteady pt presents with mild lateral sway. Sway increased with cervical rotation, flexion and extension. Pt had multiple LOB episodes but able to recover independently  Gait Pattern: Wide base of support;Decreased stride length  Stairs: No  Wheelchair Mobility  Wheelchair Mobility: No  ADL:  ADL  Grooming: Min guard  Where Assessed - Grooming: Supported standing  Upper Body Dressing: Minimal assistance  Where Assessed - Upper Body Dressing: Supported sitting  Lower Body Dressing: Minimal assistance  Where Assessed - Lower Body Dressing: Supported sit to Scientist, research (life sciences): Surveyor, minerals Method: Sit to Production manager: Bedside commode  Equipment Used: Gait belt  Transfers/Ambulation Related to ADLs: Min A with ambulation as pt impulsive and occasionally unsteady  Cognition:  Cognition  Arousal/Alertness: Awake/alert  Orientation Level: Oriented X4  Cognition  Overall Cognitive Status: Appears within functional limits for tasks assessed/performed  Arousal/Alertness: Awake/alert  Orientation Level: Appears intact for tasks assessed  Behavior During Session: [facility] for tasks performed  BP 113/73  Pulse 72  Temp 97.7 F (36.5 C) (Oral)  Resp 20  Ht 5\' 11"  (1.803 m)  Wt 181 lb 3.5 oz (82.2 kg)  BMI 25.27 kg/m2  SpO2 96%  Physical Exam  Vitals reviewed. NAD Constitutional: He is oriented to person, place, and time.  HENT: moist Head: Normocephalic and atraumatic.  Eyes: Conjunctivae normal  are normal. Pupils are equal, round, and reactive to light.  Pupils reactive to light without nystagmus  Neck: Neck supple. No thyromegaly present.  Cardiovascular: Normal rate and regular rhythm.  Cardiac rate control  Pulmonary/Chest: Effort normal and breath sounds normal. He has no wheezes. Decr BS at R base. Abdominal: Soft. Bowel sounds are normal. He exhibits no distension. There is no tenderness.  Incision clean and dressed  Neurological: He is alert and oriented to person, place, and time.  Speech is mildly dysarthric but intelligible. He does follow simple commands. Noted decreased fine motor skills in RUE. Mild weakness right arm. Gaze dysconjugate. He is unable to gaze superiorly and appears to have some limitations in the inferior fields. Lateral gaze grossly intact. Limited depth perception. No focal visual field cuts. Impulsive. No sensory deficits. Patient was a bit impulsive during exam  Skin: Skin is warm and dry.  Psychiatric: He has a normal mood and affect. His behavior is normal  Results for orders placed during the hospital encounter of 05/05/12 (from the past 48 hour(s))   TSH Status: Abnormal    Collection Time    05/07/12 10:47 AM   Component  Value  Range  Comment    TSH  4.823 (*)  0.350 - 4.500 uIU/mL    BODY FLUID CULTURE Status: Normal (Preliminary result)    Collection Time    05/07/12 12:21 PM   Component  Value  Range  Comment    Specimen Description  PLEURAL FLUID RIGHT      Special Requests  2 SYRINGES @ 60CC      Gram Stain       Value:  WBC PRESENT,BOTH PMN AND MONONUCLEAR     NO ORGANISMS SEEN    Culture  NO GROWTH      Report Status  PENDING     BODY FLUID CELL COUNT WITH DIFFERENTIAL Status: Abnormal    Collection Time    05/07/12 12:21 PM   Component  Value  Range  Comment    Fluid Type-FCT  PLEURAL FLUID ADD ON   CORRECTED ON 01/02 AT 8469: PREVIOUSLY REPORTED AS Body Fluid    Color, Fluid  BLOODY (*)  YELLOW     Appearance, Fluid  CLOUDY (*)   CLEAR     WBC, Fluid  1064 (*)  0 - 1000 cu mm     Neutrophil Count,  Fluid  40 (*)  0 - 25 %     Lymphs, Fluid  41      Monocyte-Macrophage-Serous Fluid  19 (*)  50 - 90 %    PATHOLOGIST SMEAR REVIEW Status: Normal    Collection Time    05/07/12 12:21 PM   Component  Value  Range  Comment    Path Review  Normal and reactive mesothelial cells     PROTIME-INR Status: Abnormal    Collection Time    05/08/12 4:20 AM   Component  Value  Range  Comment    Prothrombin Time  18.2 (*)  11.6 - 15.2 seconds     INR  1.56 (*)  0.00 - 1.49    HEMOGLOBIN A1C Status: Normal    Collection Time    05/08/12 4:20 AM   Component  Value  Range  Comment    Hemoglobin A1C  5.0  <5.7 %     Mean Plasma Glucose  97  <117 mg/dL    CBC WITH DIFFERENTIAL Status: Abnormal    Collection Time    05/08/12 4:20 AM   Component  Value  Range  Comment    WBC  12.1 (*)  4.0 - 10.5 K/uL     RBC  3.01 (*)  4.22 - 5.81 MIL/uL     Hemoglobin  9.4 (*)  13.0 - 17.0 g/dL     HCT  40.9 (*)  81.1 - 52.0 %     MCV  98.0  78.0 - 100.0 fL     MCH  31.2  26.0 - 34.0 pg     MCHC  31.9  30.0 - 36.0 g/dL     RDW  91.4 (*)  78.2 - 15.5 %     Platelets  337  150 - 400 K/uL     Neutrophils Relative  79 (*)  43 - 77 %     Neutro Abs  9.6 (*)  1.7 - 7.7 K/uL     Lymphocytes Relative  10 (*)  12 - 46 %     Lymphs Abs  1.2  0.7 - 4.0 K/uL     Monocytes Relative  9  3 - 12 %     Monocytes Absolute  1.1 (*)  0.1 - 1.0 K/uL     Eosinophils Relative  1  0 - 5 %     Eosinophils Absolute  0.1  0.0 - 0.7 K/uL     Basophils Relative  0  0 - 1 %     Basophils Absolute  0.0  0.0 - 0.1 K/uL    BASIC METABOLIC PANEL Status: Abnormal    Collection Time    05/08/12 4:20 AM   Component  Value  Range  Comment    Sodium  138  135 - 145 mEq/L     Potassium  5.1  3.5 - 5.1 mEq/L  DELTA CHECK NOTED    Chloride  103  96 - 112 mEq/L     CO2  29  19 - 32 mEq/L     Glucose, Bld  92  70 - 99 mg/dL     BUN  14  6 - 23 mg/dL     Creatinine, Ser  9.56  0.50 -  1.35 mg/dL     Calcium  8.0 (*)  8.4 - 10.5 mg/dL     GFR calc non Af Amer  >90  >90 mL/min     GFR calc Af Amer  >90  >  90 mL/min    LIPID PANEL Status: Abnormal    Collection Time    05/08/12 4:20 AM   Component  Value  Range  Comment    Cholesterol  114  0 - 200 mg/dL     Triglycerides  54  <150 mg/dL     HDL  34 (*)  >45 mg/dL     Total CHOL/HDL Ratio  3.4      VLDL  11  0 - 40 mg/dL     LDL Cholesterol  69  0 - 99 mg/dL    HEPARIN LEVEL (UNFRACTIONATED) Status: Abnormal    Collection Time    05/08/12 10:15 PM   Component  Value  Range  Comment    Heparin Unfractionated  <0.10 (*)  0.30 - 0.70 IU/mL    Dg Chest 1 View  05/07/2012 *RADIOLOGY REPORT* Clinical Data: Right pleural effusion. Status post thoracentesis. CHEST - 1 VIEW Comparison: 05/06/2012 Findings: Small right pleural effusion has decreased size since previous study. No evidence of pneumothorax. There is persistent infiltrate or atelectasis in both lower lobes. Cardiomegaly is stable. Previous median sternotomy and aortic valve replacement again noted. IMPRESSION: 1. Decreased size of right pleural effusion. No pneumothorax identified. 2. Persistent bibasilar atelectasis versus infiltrates. 3. Stable cardiomegaly. Original Report Authenticated By: Myles Rosenthal, M.D.  Mr [facility] Wo Contrast  05/08/2012 *RADIOLOGY REPORT* Clinical Data: 47 year old male with type A dissection repair. Visual disturbance. Suggestion of posterior circulation emboli on recent MRI. Comparison: Brain MRIs 05/06/2012, 05/02/2012. Contrast: 18 ml MultiHance which was extravasated during attempted postcontrast MRA imaging. No postcontrast imaging was obtained. The patient tolerated this well and was in no acute distress. Standard extravasation treatment guidelines were followed. MRA NECK WITHOUT CONTRAST Technique: Angiographic images of the neck were obtained using MRA technique without intravenous contrast. Findings: Layering bilateral pleural effusions are  evident in the upper chest. Antegrade flow in the bilateral cervical carotid arteries. Carotid bifurcations are within normal limits for noncontrast technique. Antegrade flow in the left vertebral artery with no abnormality evident. There is intermittently decreased flow signal in the right vertebral artery throughout the neck. Initially, the right vertebral artery caliber appears similar to that on the left (series 4 image 92). In the distal V2 segment and at the skull base the vertebral artery caliber appears significantly reduced. IMPRESSION: 1. Intermittently irregular appearance and decreased flow in the right vertebral artery throughout the neck. The findings in this setting could reflect right vertebral artery thromboembolic disease or vertebral artery dissection. Neck CTA may characterize further as clinically necessary. 2. Otherwise negative noncontrast neck MRA. 3. Intracranial findings are below. 4. Layering pleural effusions. MRA HEAD WITHOUT CONTRAST Technique: Angiographic images of the Circle of Willis were obtained using MRA technique without intravenous contrast. Findings: Decreased antegrade flow in the distal right vertebral artery in keeping with the neck MRA findings. Robust antegrade flow in the distal left vertebral artery. Flow in both PICA origins is preserved. Vertebrobasilar junction is patent. Right AICA flow is evident. No basilar artery stenosis. SCA is duplicated on the right. SCA and PCA origins are within normal limits. Bilateral PCA branches are within normal limits. Antegrade flow in both ICA siphons. No ICA stenosis. Ophthalmic artery origins are normal. Posterior communicating arteries are diminutive or absent. Normal carotid termini. MCA and ACA origins are normal. Normal anterior communicating artery and visualized ACA branches. Visualized bilateral MCA branches are within normal limits. IMPRESSION: 1. Decreased flow in the distal right vertebral artery in keeping  with the neck  MRA findings. Flow at the right PICA origin appears preserved. 2. Otherwise negative posterior circulation, and otherwise negative intracranial MRA. Original Report Authenticated By: Erskine Speed, M.D.  Mr Angiogram Neck Wo Contrast  05/08/2012 *RADIOLOGY REPORT* Clinical Data: 48 year old male with type A dissection repair. Visual disturbance. Suggestion of posterior circulation emboli on recent MRI. Comparison: Brain MRIs 05/06/2012, 05/02/2012. Contrast: 18 ml MultiHance which was extravasated during attempted postcontrast MRA imaging. No postcontrast imaging was obtained. The patient tolerated this well and was in no acute distress. Standard extravasation treatment guidelines were followed. MRA NECK WITHOUT CONTRAST Technique: Angiographic images of the neck were obtained using MRA technique without intravenous contrast. Findings: Layering bilateral pleural effusions are evident in the upper chest. Antegrade flow in the bilateral cervical carotid arteries. Carotid bifurcations are within normal limits for noncontrast technique. Antegrade flow in the left vertebral artery with no abnormality evident. There is intermittently decreased flow signal in the right vertebral artery throughout the neck. Initially, the right vertebral artery caliber appears similar to that on the left (series 4 image 92). In the distal V2 segment and at the skull base the vertebral artery caliber appears significantly reduced. IMPRESSION: 1. Intermittently irregular appearance and decreased flow in the right vertebral artery throughout the neck. The findings in this setting could reflect right vertebral artery thromboembolic disease or vertebral artery dissection. Neck CTA may characterize further as clinically necessary. 2. Otherwise negative noncontrast neck MRA. 3. Intracranial findings are below. 4. Layering pleural effusions. MRA HEAD WITHOUT CONTRAST Technique: Angiographic images of the Circle of Willis were obtained using MRA  technique without intravenous contrast. Findings: Decreased antegrade flow in the distal right vertebral artery in keeping with the neck MRA findings. Robust antegrade flow in the distal left vertebral artery. Flow in both PICA origins is preserved. Vertebrobasilar junction is patent. Right AICA flow is evident. No basilar artery stenosis. SCA is duplicated on the right. SCA and PCA origins are within normal limits. Bilateral PCA branches are within normal limits. Antegrade flow in both ICA siphons. No ICA stenosis. Ophthalmic artery origins are normal. Posterior communicating arteries are diminutive or absent. Normal carotid termini. MCA and ACA origins are normal. Normal anterior communicating artery and visualized ACA branches. Visualized bilateral MCA branches are within normal limits. IMPRESSION: 1. Decreased flow in the distal right vertebral artery in keeping with the neck MRA findings. Flow at the right PICA origin appears preserved. 2. Otherwise negative posterior circulation, and otherwise negative intracranial MRA. Original Report Authenticated By: Erskine Speed, M.D.  Dg Swallowing Func-speech Pathology  05/08/2012 Lenor Derrick, CCC-SLP 05/08/2012 4:08 PM Objective Swallowing Evaluation: Modified Barium Swallowing Study Patient Details Name: Zaidan Keeble MRN: [id number] Date of Birth: 1966/04/11 Today's Date: 05/08/2012 Time: - Past Medical History: Past Medical History Diagnosis Date . Anxiety . Bicuspid aortic valve . Aortic stenosis mechanical AVR 04/22/12 . Shortness of breath . Dysrhythmia . Thoracic aortic aneurysm replacement aortic root 04/22/12 . CVA (cerebral infarction) occipital CVA 12/13 Past Surgical History: Past Surgical History Procedure Date . Cardiac catheterization . Rotator cuff repair 12 years ago, Left . Thoracic aortic aneurysm repair 04/22/2012 Procedure: THORACIC ASCENDING ANEURYSM REPAIR (AAA); Surgeon: [name], MD; Location: [facility] OR; Service: Open Heart Surgery;  Laterality: N/A; . Coronary artery bypass graft 04/22/2012 Procedure: CORONARY ARTERY BYPASS GRAFTING (CABG); Surgeon: [name], MD; Location: [facility] OR; Service: Open Heart Surgery; Laterality: N/A; times one with right greater saphenous vein graft . Intraoperative transesophageal echocardiogram  04/22/2012 Procedure: INTRAOPERATIVE TRANSESOPHAGEAL ECHOCARDIOGRAM; Surgeon: [name], MD; Location: [facility] OR; Service: Open Heart Surgery; Laterality: N/A; HPI: Mr. Xyon Lukasik is a 47 y.o. male presenting with difficulty focusing on objects,nausea, vomiting, fall, non-responsive for short time but then could get him to partially respond, after a few minutes he came back to his normal state. He did not remember the occurrence. She visualized no shaking, no incontinence but that his eyes rolled back in his head and he was diaphorectic. Imaging reveals definite new punctate infarction within the left cerebellum and with possible punctate infarctions in occiptal lobes consistent with micro-emboli. Work up underway. On warfarin prior to admission. Now on warfarin for secondary stroke prevention. Patient with resultant resolved symptoms in ER. During last hospital stay pt had MBS showing aspiratin of thin liquids improved with chin tuck or with nectar thick consistency. 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 Pt tolerated this diet until d/c with wife demonstrating understanding of diet and precautions. Upon d/c wife reports pt with impulsivity, having some choking with nectar thick liquids. At this time his CXR has improved since d/c. Assessment / Plan / Recommendation Clinical Impression Dysphagia Diagnosis: Mild pharyngeal phase dysphagia Clinical impression: Patient with improved swallow function when compared with the study on 05/01/12, however, patient now with a mild pharyngeal dysphagia, characterized by reduced epiglottic inversion, anterior hyoid movement, and  laryngeal elevation, resulting in penetration of both thin and nectar thick liquids. There was no penetration when a chin tuck head position was utilized. There was no delay to initiate the swallow, and no laryngeal residue. No aspiration was observed with any consistenciy. Patient remains impulsive, and will require full supervision, at least initially, to ensure small bites/sips, and use of chin tuck. Treatment Recommendation Therapy as outlined in treatment plan below Diet Recommendation Regular;Thin liquid Liquid Administration via: Cup;No straw Medication Administration: Whole meds with puree Supervision: Patient able to self feed;Full supervision/cueing for compensatory strategies Compensations: Slow rate;Small sips/bites Postural Changes and/or Swallow Maneuvers: Chin tuck Other Recommendations Recommended Consults: (FEES equipment is down. MBS was completed instead.) Oral Care Recommendations: Oral care BID Follow Up Recommendations Inpatient Rehab Frequency and Duration min 2x/week 2 weeks Pertinent Vitals/Pain n/a SLP Swallow Goals Patient will consume recommended diet without observed clinical signs of aspiration with: Minimal assistance;Supervision/safety Patient will utilize recommended strategies during swallow to increase swallowing safety with: Minimal assistance;Supervision/safety General HPI: Mr. Taysean Wager is a 47 y.o. male presenting with difficulty focusing on objects,nausea, vomiting, fall, non-responsive for short time but then could get him to partially respond, after a few minutes he came back to his normal state. He did not remember the occurrence. She visualized no shaking, no incontinence but that his eyes rolled back in his head and he was diaphorectic. Imaging reveals definite new punctate infarction within the left cerebellum and with possible punctate infarctions in occiptal lobes consistent with micro-emboli. Work up underway. On warfarin prior to admission. Now on warfarin  for secondary stroke prevention. Patient with resultant resolved symptoms in ER. During last hospital stay pt had MBS showing aspiratin of thin liquids improved with chin tuck or with nectar thick consistency. 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 Pt tolerated this diet until d/c with wife demonstrating understanding of diet and precautions. Upon d/c wife reports pt with impulsivity, having some choking with nectar thick liquids. At this time his CXR has improved since d/c. Type of Study: Modified Barium Swallowing Study Reason for Referral: Objectively evaluate  swallowing function Previous Swallow Assessment: MBS 05/01/12 Pt presents with a pharyngeal dysphagia, isolated to inadequate laryngeal closure during the pharyngeal phase. This impairment leads to consistent aspiration of thin liquids, accompanied by a nonprotective cough response. Postural adjustments (head turn to left and right) increased aspiration; a chin tuck resulted in liquids penetrating the larynx but not descending below the vocal cords. 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 After lengthy discussion with pt and spouse, rec: nectar-thick liquids OR thin liquids with use of a chin-tuck; meds whole with puree; Diet Prior to this Study: Regular;Nectar-thick liquids Temperature Spikes Noted: No Respiratory Status: Room air History of Recent Intubation: Yes Length of Intubations (days): 1 days Date extubated: 04/27/12 Behavior/Cognition: Alert;Impulsive;Decreased sustained attention Oral Cavity - Dentition: Adequate natural dentition Oral Motor / Sensory Function: Within functional limits Self-Feeding Abilities: Able to feed self;Needs assist Patient Positioning: Upright in chair Baseline Vocal Quality: Hoarse Volitional Cough: Strong;Congested Volitional Swallow: Able to elicit Anatomy: Within functional limits Pharyngeal Secretions: Not observed secondary MBS Reason for Referral  Objectively evaluate swallowing function Oral Phase Oral Preparation/Oral Phase Oral Phase: WFL Pharyngeal Phase Pharyngeal Phase Pharyngeal Phase: Impaired Pharyngeal - Nectar Pharyngeal - Nectar Teaspoon: Reduced laryngeal elevation;Reduced epiglottic inversion;Reduced anterior laryngeal mobility;Penetration/Aspiration during swallow Pharyngeal - Nectar Cup: Reduced epiglottic inversion;Reduced anterior laryngeal mobility;Reduced laryngeal elevation;Penetration/Aspiration during swallow Pharyngeal - Thin Pharyngeal - Thin Teaspoon: Reduced anterior laryngeal mobility;Reduced laryngeal elevation;Reduced epiglottic inversion;Penetration/Aspiration during swallow Pharyngeal - Thin Cup: Reduced epiglottic inversion;Reduced anterior laryngeal mobility;Reduced laryngeal elevation;Penetration/Aspiration during swallow Penetration/Aspiration details (thin cup): Material enters airway, CONTACTS cords and not ejected out Cervical Esophageal Phase GO Cervical Esophageal Phase Cervical Esophageal Phase: Vicente Masson T 05/08/2012, 4:08 PM  US Thoracentesis Asp Pleural Space W/img Guide  05/08/2012 *RADIOLOGY REPORT* Clinical Data: Patient status post aortic dissection repair, aortic valve placement, CABG. Now with right pleural effusion. Request is made for diagnostic and therapeutic right thoracentesis. ULTRASOUND GUIDED DIAGNOSTIC AND THERAPEUTIC RIGHT THORACENTESIS An ultrasound guided thoracentesis was thoroughly discussed with the patient and questions answered. The benefits, risks, alternatives and complications were also discussed. The patient understands and wishes to proceed with the procedure. Written consent was obtained. Ultrasound was performed to localize and mark an adequate pocket of fluid in the right chest. The area was then prepped and draped in the normal sterile fashion. 1% Lidocaine was used for local anesthesia. Under ultrasound guidance a 19 gauge Yueh catheter was introduced. Thoracentesis was  performed. The catheter was removed and a dressing applied. Complications: none Findings: A total of approximately 1.8 liters of blood-tinged fluid was removed. A fluid sample was sent for culture. IMPRESSION: Successful ultrasound guided diagnostic and therapeutic right thoracentesis yielding 1.8 liters of pleural fluid. Read by: Jeananne Rama, P.A.-C Original Report Authenticated By: Irish Lack, M.D.  Post Admission Physician Evaluation:  1. Functional deficits secondary to embolic cerebellar and occipital lobe infarcts. 2. Patient is admitted to receive collaborative, interdisciplinary care between the physiatrist, rehab nursing staff, and therapy team. 3. Patient's level of medical complexity and substantial therapy needs in context of that medical necessity cannot be provided at a lesser intensity of care such as a SNF. 4. Patient has experienced substantial functional loss from his/her baseline which was documented above under the "Functional History" and "Functional Status" headings. Judging by the patient's diagnosis, physical exam, and functional history, the patient has potential for functional progress which will result in measurable gains while on inpatient rehab. These gains will be of substantial and practical use upon  discharge in facilitating mobility and self-care at the household level. 5. Physiatrist will provide 24 hour management of medical needs as well as oversight of the therapy plan/treatment and provide guidance as appropriate regarding the interaction of the two. 6. 24 hour rehab nursing will assist with bladder management, bowel management, safety, skin/wound care, disease management, medication administration, pain management and patient education and help integrate therapy concepts, techniques,education, etc. 7. PT will assess and treat for: Lower extremity strength, range of motion, stamina, balance, functional mobility, safety, adaptive techniques, visual deficits and  equipment, NMR, vestibular deficits, education. Goals are: mod I to supervision. 8. OT will assess and treat for: ADL's, functional mobility, safety, upper extremity strength, adaptive techniques and equipment, NMR, vestibular and visual deficits. Goals are: mod I to supervision. 9. SLP will assess and treat for: n/a. Goals are: n/a. 10. Case Management and Social Worker will assess and treat for psychological issues and discharge planning. 11. Team conference will be held weekly to assess progress toward goals and to determine barriers to discharge. 12. Patient will receive at least 3 hours of therapy per day at least 5 days per week. 13. ELOS: 5-7 days Prognosis: excellent Medical Problem List and Plan:  1. embolic cerebellar and occipital lobe infarcts  2. DVT Prophylaxis/Anticoagulation: Chronic Coumadin therapy. INR today of 1.66. Continue intravenous heparin to IR greater than 2.00 Monitor for a bleeding episodes  3. Pain Management: Percocet one tablet every 4 hours as needed severe pain. Monitor the increased mobility  4. Neuropsych: This patient is capable of making decisions on his/her own behalf.  5. 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 Followup per cardiology services  6. Hypertension. Amiodarone 200 mg twice a day.  7. Right pleural effusion. Status post thoracentesis 05/08/2011. Followup chest x-ray with no pneumothorax. Check oxygen saturations every shift 8. New cough. Afebrile. CXR. Labs. Albuterol/Atrovent. Cough syrup. May need abx

## 2012-05-10 NOTE — Progress Notes (Signed)
ANTICOAGULATION CONSULT NOTE - Follow Up Consult  Pharmacy Consult for Heparin and Coumadin Indication: atrial fibrillation and recent mechanical AVR, new CVA this admit (with subtherapeutic INR)  Allergies  Allergen Reactions  . Dilaudid (Hydromorphone Hcl) Other (See Comments)    High doses cause itching  . Penicillins Other (See Comments)    Unknown    Patient Measurements: Height: 5\' 11"  (180.3 cm) Weight: 181 lb 3.5 oz (82.2 kg) IBW/kg (Calculated) : 75.3  Heparin Dosing Weight: 84.4 kg  Vital Signs: Temp: 97.7 F (36.5 C) (01/04 0500) Temp src: Oral (01/04 0500) BP: 113/73 mmHg (01/04 0500) Pulse Rate: 72  (01/04 0500)  Labs:  Basename 05/10/12 0630 05/09/12 1804 05/09/12 1733 05/09/12 0718 05/08/12 0420  HGB 10.0* -- -- 9.4* --  HCT 31.0* -- -- 29.2* 29.5*  PLT 367 -- -- 330 337  APTT -- -- -- -- --  LABPROT 20.4* -- -- 19.1* 18.2*  INR 1.82* -- -- 1.66* 1.56*  HEPARINUNFRC 0.47 0.29* 0.41 -- --  CREATININE -- -- -- 0.69 0.86  CKTOTAL -- -- -- -- --  CKMB -- -- -- -- --  TROPONINI -- -- -- -- --    Estimated Creatinine Clearance: 122.9 ml/min (by C-G formula based on Cr of 0.69).   Medications:  Scheduled:     . amiodarone  200 mg Oral BID  . aspirin  81 mg Oral Daily  . pantoprazole  40 mg Oral Daily  . [COMPLETED] warfarin  7.5 mg Oral ONCE-1800  . Warfarin - Pharmacist Dosing Inpatient   Does not apply q1800  . [DISCONTINUED] warfarin  7.5 mg Oral ONCE-1800    Goal of Therapy:  INR 2-3 Heparin level 0.3-0.5 units/ml Monitor platelets by anticoagulation protocol: Yes  Assessment and Plan: 47 yo M who unfortunately suffered a CVA while INR was subtherapeutic on Coumadin for hx Afib and mechanical valve.  Pt was followed on inpatient service and transfered to rehab on 05/09/12.   Heparin level is 0.47 today which is therapeutic.  Pt also continues on Coumadin for hx Afib and mechanical valve.  INR today is 1.82. Plan: Coumadin 7.5mg  x 1 dose  today.  Continue heparin at same rate. Daily heparin level, CBC, and protime.  Celedonio Miyamoto, PharmD, BCPS Clinical Pharmacist Pager [phone]  05/10/2012 8:37 AM

## 2012-05-10 NOTE — Progress Notes (Signed)
Nutrition Brief Note  Patient identified on the Malnutrition Screening Tool (MST) Report for unsure of any weight loss and recent poor intake.  Per review of usual weights in EMR, patient has not had any significant weight loss.  PO intake is excellent.  Wt Readings from Last 10 Encounters:  05/10/12 181 lb 3.5 oz (82.2 kg)  05/09/12 186 lb 1.6 oz (84.414 kg)  05/03/12 183 lb 10.3 oz (83.3 kg)  05/03/12 183 lb 10.3 oz (83.3 kg)     Body mass index is 25.27 kg/(m^2). Patient meets criteria for overweight based on current BMI.   Current diet order is Heart Healthy, patient is consuming approximately 100% of meals at this time. Labs and medications reviewed.   No nutrition interventions warranted at this time. If nutrition issues arise, please consult RD.   Joaquin Courts, RD, LDN, CNSC Pager# [phone] After Hours Pager# [phone]

## 2012-05-10 NOTE — Evaluation (Addendum)
Physical Therapy Assessment and Plan  Patient Details  Name: Jon [name] MRN: [id number] Date of Birth: [birth year]  PT Diagnosis: Difficulty walking, Edema, Hemiparesis non-dominant, Impaired cognition and Muscle weakness Rehab Potential: Good ELOS: 7 days   Today's Date: 05/10/2012 Time:1040-1135 55 min  -     Problem List:  Patient Active Problem List  Diagnosis  . HCAP (healthcare-associated pneumonia)  . Syncope  . Anxiety  . Aortic stenosis  . Aortic aneurysm and dissection  . Thoracic aortic aneurysm  . Stroke, embolic    Past Medical History:  Past Medical History  Diagnosis Date  . Anxiety   . Bicuspid aortic valve   . Aortic stenosis     mechanical AVR 04/22/12  . Shortness of breath   . Dysrhythmia   . Thoracic aortic aneurysm     replacement aortic root 04/22/12  . 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 Cardiac catheterization   . Rotator cuff repair     12 years ago, Left  . Thoracic aortic aneurysm repair 04/22/2012    Procedure: THORACIC ASCENDING ANEURYSM REPAIR (AAA);  Surgeon: [name], MD;  Location: [facility] OR;  Service: Open Heart Surgery;  Laterality: N/A;  . Coronary artery bypass graft 04/22/2012    Procedure: CORONARY ARTERY BYPASS GRAFTING (CABG);  Surgeon: [name], MD;  Location: [facility] OR;  Service: Open Heart Surgery;  Laterality: N/A;  times one with right greater saphenous vein graft  . Intraoperative transesophageal echocardiogram 04/22/2012    Procedure: INTRAOPERATIVE TRANSESOPHAGEAL ECHOCARDIOGRAM;  Surgeon: [name], MD;  Location: [facility] OR;  Service: Open Heart Surgery;  Laterality: N/A;    Assessment & Plan Clinical Impression: Jon [name] is a 47 y.o. right-handed male with history of aortic stenosis since childhood, recent type A dissection requiring emergent repair 04/22/2012 and maintained on chronic  Coumadin was recently discharged from the hospital 05/03/2012. Patient was readmitted 05/05/2012 after being found nonresponsive by his wife and diaphoretic. After about 2-3 minutes he came around. He did not recall the period of time he was unresponsive.  MRI of the brain showed new punctate infarct within the left cerebellum question a few other punctate infarcts in the occipital lobes. Cardiac enzymes were negative. Echocardiogram showed ejection fraction of 40% with hypokinesis of the entire anteroseptal myocardium. . Bouts of bradycardia into the 30s while sleeping of which remained asymptomatic and followup per cardiology services and metoprolol was discontinued and remains on amiodarone. Patient remains on chronic Coumadin therapy.[facility] right pleural effusion underwent thoracentesis 05/08/2011 with 1.8 L of blood-tinged fluid removed. Patient continues to have issues in relation to focusing and limited vision as he was seen by ophthalmology while hospitalized on 05/01/2012 exam was essentially normal with possibility of refraction. Patient transferred to CIR on 05/09/2012 .   Patient currently requires min with mobility secondary to decreased cardiorespiratoy endurance, impaired timing and sequencing and unbalanced muscle activation, field cut and central origin.  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  He lived with Spouse, an adult daughter and 3 grandchildren in a House home.  Home access is  Stairs to enter (1 step/threshold).  Patient will benefit from skilled PT intervention to maximize safe functional mobility, minimize fall risk and decrease caregiver burden for planned  discharge home with 24 hour supervision, min assist in community.  Pt is unsafe, impulsive, anxious and unwilling to accept suggestions from his wife for safety.   He has balance  deficits and significant visual deficits making him a high fall risk.  Anticipate patient will benefit from OPPT at d/c- rehab for balance and L sided weakness,  transitioning to cardiac rehab when appropriate.  PT - End of Session Activity Tolerance: Tolerates 30+ min activity with multiple rests Endurance Deficit: Yes Endurance Deficit Description: fatigued after Sharlene Motts PT Assessment Rehab Potential: Good Barriers to Discharge: None PT Plan PT Intensity: Minimum of 1-2 x/day ,45 to 90 minutes PT Frequency: 5 out of 7 days PT Duration Estimated Length of Stay: 7 days PT Treatment/Interventions: Ambulation/gait training;Balance/vestibular training;Cognitive remediation/compensation;Discharge planning;DME/adaptive equipment instruction;Functional mobility training;Neuromuscular re-education;Patient/family education;Stair training;Therapeutic Activities;Therapeutic Exercise;UE/LE Strength taining/ROM;Visual/perceptual remediation/compensation PT Recommendation Follow Up Recommendations: Outpatient PT (cardiac rehab) Patient destination: Home Equipment Details: TBD  PT Evaluation Precautions/Restrictions Precautions Precautions: Fall General   Vital SignsTherapy Vitals Pulse Rate: 104  BP: 113/78 mmHg Patient Position, if appropriate: Sitting Oxygen Therapy SpO2: 95 % O2 Device: None (Room air) Pain Pain Assessment Pain Assessment: No/denies pain Home Living/Prior Functioning Home Living Lives With: Spouse Available Help at Discharge: Family Type of Home: House Home Access: Stairs to enter (1 step/threshold) Home Layout: One level Prior Function Level of Independence: Independent with basic ADLs Able to Take Stairs?: Yes Driving: Yes Vocation: Full time employment Comments: Curator and race car driver Vision/Perception-  significant deficits with field cuts superior fields, blurriness; see OT eval     Cognition Overall Cognitive Status: Appears within functional limits  for tasks assessed Arousal/Alertness: Awake/alert Orientation Level: Oriented to person;Oriented to place;Oriented to situation Sensation Sensation Light Touch: Appears Intact Proprioception: Appears Intact Coordination Heel Shin Test: bil intact Motor  Motor Motor: Hemiplegia;Motor impersistence Motor - Skilled Clinical Observations: mild weakness L hip  Mobility Bed Mobility Supine to Sit: 7: Independent Sit to Supine: 7: Independent Transfers Sit to Stand: 4: Min guard Stand to Sit: 4: Min guard Locomotion  Ambulation Ambulation: Yes Ambulation/Gait Assistance: 4: Min guard Ambulation Distance (Feet): 150 Feet Ambulation/Gait Assistance Details: Verbal cues for precautions/safety Gait Gait: Yes Gait Pattern: Within Functional Limits Gait Pattern: Decreased stride length Stairs / Additional Locomotion Stairs: Yes Stairs Assistance: 4: Min guard (impulsive; difficult managing IV lines) Stair Management Technique: Two rails Number of Stairs: 10  Height of Stairs: 7  Wheelchair Mobility Wheelchair Mobility: No  Trunk/Postural Assessment  Cervical Assessment Cervical Assessment: Within Functional Limits Thoracic Assessment Thoracic Assessment: Within Functional Limits Lumbar Assessment Lumbar Assessment: Within Functional Limits Postural Control Postural Control: Deficits on evaluation Protective Responses: delayed and inadequate  Balance Balance Balance Assessed: Yes Standardized Balance Assessment Standardized Balance Assessment: Berg Balance Test Berg Balance Test Sit to Stand: Able to stand without using hands and stabilize independently Standing Unsupported: Able to stand 2 minutes with supervision Sitting with Back Unsupported but Feet Supported on Floor or Stool: Able to sit safely and securely 2 minutes Stand to Sit: Sits safely with minimal use of hands Transfers: Able to transfer safely, minor use of hands Standing Unsupported with Eyes Closed: Able  to stand 10 seconds with supervision (SOB during this task) Standing Ubsupported with Feet Together: Able to place feet together independently and stand 1 minute safely From Standing, Reach Forward with Outstretched Arm: Can reach confidently >25 cm (10") From Standing Position, Pick up Object from Floor: Able to pick up shoe safely and easily  From Standing Position, Turn to Look Behind Over each Shoulder: Looks behind from both sides and weight shifts well Turn 360 Degrees: Able to turn 360 degrees safely in 4 seconds or less Standing Unsupported, Alternately Place Feet on Step/Stool: Able to complete >2 steps/needs minimal assist Standing Unsupported, One Foot in Front: Needs help to step but can hold 15 seconds Standing on One Leg: Unable to try or needs assist to prevent fall Total Score: 44  Extremity Assessment      RLE Assessment RLE Assessment: Within Functional Limits; edematous LLE Assessment LLE Assessment: Within Functional Limits (L hip flexors 4+/5), with motor impersistance; edematous  FIM:  FIM - Locomotion: Ambulation Ambulation/Gait Assistance: 4: Min guard   Refer to Care Plan for Long Term Goals  Recommendations for other services: None  Discharge Criteria: Patient will be discharged from PT if patient refuses treatment 3 consecutive times without medical reason, if treatment goals not met, if there is a change in medical status, if patient makes no progress towards goals or if patient is discharged from hospital.  The above assessment, treatment plan, treatment alternatives and goals were discussed and mutually agreed upon: by patient and by family  Treatment today- Berg balance test; pt with SOB/anxiety? during difficult portions of test; RN notified; repetitive sit>< stands without use of hands for = wt bearing, LLE neuromuscular re-education via forced use, VCs, demo.  Pt is impulsive, unsafe due to visual deficits, and has poor insight into extent of  deficits.  Arthur Speagle 05/10/2012, 11:52 AM

## 2012-05-11 ENCOUNTER — Inpatient Hospital Stay (HOSPITAL_COMMUNITY): Payer: Self-pay | Admitting: Occupational Therapy

## 2012-05-11 DIAGNOSIS — I639 Cerebral infarction, unspecified: Secondary | ICD-10-CM

## 2012-05-11 DIAGNOSIS — R229 Localized swelling, mass and lump, unspecified: Secondary | ICD-10-CM

## 2012-05-11 HISTORY — DX: Cerebral infarction, unspecified: I63.9

## 2012-05-11 LAB — CBC
HCT: 29.5 % — ABNORMAL LOW (ref 39.0–52.0)
MCV: 94.9 fL (ref 78.0–100.0)
RBC: 3.11 MIL/uL — ABNORMAL LOW (ref 4.22–5.81)
WBC: 7.8 10*3/uL (ref 4.0–10.5)

## 2012-05-11 LAB — CBC WITH DIFFERENTIAL/PLATELET
Basophils Absolute: 0.1 10*3/uL (ref 0.0–0.1)
Eosinophils Relative: 2 % (ref 0–5)
HCT: 32.4 % — ABNORMAL LOW (ref 39.0–52.0)
Hemoglobin: 10.4 g/dL — ABNORMAL LOW (ref 13.0–17.0)
Lymphocytes Relative: 10 % — ABNORMAL LOW (ref 12–46)
MCHC: 32.1 g/dL (ref 30.0–36.0)
MCV: 94.7 fL (ref 78.0–100.0)
Monocytes Absolute: 0.8 10*3/uL (ref 0.1–1.0)
Monocytes Relative: 9 % (ref 3–12)
RDW: 18.9 % — ABNORMAL HIGH (ref 11.5–15.5)
WBC: 8.9 10*3/uL (ref 4.0–10.5)

## 2012-05-11 LAB — BODY FLUID CULTURE: Culture: NO GROWTH

## 2012-05-11 LAB — BASIC METABOLIC PANEL
BUN: 8 mg/dL (ref 6–23)
CO2: 28 mEq/L (ref 19–32)
Chloride: 102 mEq/L (ref 96–112)
Creatinine, Ser: 0.74 mg/dL (ref 0.50–1.35)

## 2012-05-11 MED ORDER — ASPIRIN 81 MG PO CHEW
81.0000 mg | CHEWABLE_TABLET | Freq: Every day | ORAL | Status: DC
  Administered 2012-05-15: 81 mg via ORAL
  Filled 2012-05-11 (×2): qty 1

## 2012-05-11 MED ORDER — WARFARIN SODIUM 5 MG PO TABS
5.0000 mg | ORAL_TABLET | Freq: Once | ORAL | Status: AC
  Administered 2012-05-11: 5 mg via ORAL
  Filled 2012-05-11: qty 1

## 2012-05-11 NOTE — Progress Notes (Signed)
Patient ID: Jon [name], male   DOB: 12/09/1965, 47 y.o.   MRN: [id number]  Dr. Posey Rea asked me to evaluate the right groin due to hematoma there. There is a soft hematoma or seroma beneath the incision in right groin/upper thigh. It is non-tender and not tense. There is no thrill or bruit. I suspect that this has been there. There is an open incision inferior to this that opened and drained old blood while he was still in the hospital postop. I suspect that this fluid collection will probably make its way down to the open wound and drain at some point. I would not do anything differently. Continue dressing changes to open wound.

## 2012-05-11 NOTE — Progress Notes (Signed)
Dr. Amador Cunas notified of pt with large hematoma in rt groin.  No new orders received.

## 2012-05-11 NOTE — Progress Notes (Signed)
Jon [name],NT reports that patient and wife is refusing to have bed alarm activated.  Wife noted to be turning off bed alarm after NT activates it per NT report.

## 2012-05-11 NOTE — Progress Notes (Signed)
Occupational Therapy Session Note  Patient Details  Name: Jon [name] MRN: [id number] Date of Birth: 10-11-1965  Today's Date: 05/11/2012 Time:  - 1300-1400   Skilled Therapeutic Interventions/Progress Updates:    Patient impulsive with 1 episode of loss of balance in which he was able to grab onto something in the environment to self balance.    (one loss of balance was when he turned very quickly to his right)  Patient SOB with all activities and required rest breaks after about 2-3 minutes of activity.   He and his wife mildly argued and debated during the session.  The patient seemed to get a little upset and asked his wife to stop saying "bad" things about him.  She stated she wanted him to "settle down and be safe and do what staff asks" him to do.   Therapy Documentation Precautions: impulsive Precautions Precautions: Fall Restrictions Weight Bearing Restrictions: No   Pain:denied  See FIM for current functional status  Therapy/Group: Individual Therapy  Bud Face [facility] 05/11/2012, 6:26 PM

## 2012-05-11 NOTE — Progress Notes (Signed)
Dr. Posey Rea notified of patient's and wife's request to go off unit to visit with grandchildren.  Order received:  Okay for patient to go off unit with wife or staff prn.

## 2012-05-11 NOTE — Progress Notes (Signed)
ANTICOAGULATION CONSULT NOTE - Follow Up Consult  Pharmacy Consult for Heparin and Coumadin Indication: atrial fibrillation and recent mechanical AVR, new CVA this admit (with subtherapeutic INR)  Allergies  Allergen Reactions  . Dilaudid (Hydromorphone Hcl) Other (See Comments)    High doses cause itching  . Penicillins Other (See Comments)    Unknown    Patient Measurements: Height: 5\' 11"  (180.3 cm) Weight: 181 lb 3.5 oz (82.2 kg) IBW/kg (Calculated) : 75.3  Heparin Dosing Weight: 84.4 kg  Vital Signs: Temp: 98.1 F (36.7 C) (01/05 0500) Temp src: Oral (01/05 0500) BP: 110/68 mmHg (01/05 0500) Pulse Rate: 61  (01/05 0500)  Labs:  Basename 05/11/12 0623 05/10/12 0630 05/09/12 1804 05/09/12 0718  HGB 9.6* 10.0* -- --  HCT 29.5* 31.0* -- 29.2*  PLT 352 367 -- 330  APTT -- -- -- --  LABPROT 23.2* 20.4* -- 19.1*  INR 2.16* 1.82* -- 1.66*  HEPARINUNFRC 0.53 0.47 0.29* --  CREATININE -- -- -- 0.69  CKTOTAL -- -- -- --  CKMB -- -- -- --  TROPONINI -- -- -- --    Estimated Creatinine Clearance: 122.9 ml/min (by C-G formula based on Cr of 0.69).   Medications:  Scheduled:     . amiodarone  200 mg Oral BID  . aspirin  81 mg Oral Daily  . pantoprazole  40 mg Oral Daily  . [COMPLETED] warfarin  7.5 mg Oral ONCE-1800  . Warfarin - Pharmacist Dosing Inpatient   Does not apply q1800  . [DISCONTINUED] albuterol  2.5 mg Nebulization Q6H  . [DISCONTINUED] ipratropium  0.5 mg Nebulization Q6H    Goal of Therapy:  INR 2-3 Heparin level 0.3-0.5 units/ml Monitor platelets by anticoagulation protocol: Yes  Assessment and Plan: 47 yo M who unfortunately suffered a CVA while INR was subtherapeutic on Coumadin for hx Afib and mechanical valve.  Pt was followed on inpatient service and transfered to rehab on 05/09/12.   Heparin level is 0.53 today which is slightly above goal.  Platelet count stable.  Pt also continues on Coumadin for hx Afib and mechanical valve.  INR today  is 2.16.  Plan: Coumadin 5mg  x 1 dose today.  Stop heparin as INR therapeutic.   Daily protimes.  Celedonio Miyamoto, PharmD, BCPS Clinical Pharmacist Pager [phone]  05/11/2012 7:44 AM

## 2012-05-11 NOTE — Progress Notes (Signed)
C/o a new swelling in the R groin this am C/o cough with blood tinged sputum x 1-2 d - better today. The sputum is clear today No  CP or SOB He quit smoking 1 month ago  HPI: Jon [name] is a 47 y.o. 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 Patient was readmitted 05/05/2012 after being found nonresponsive by his wife and diaphoretic. After about 2-3 minutes he came around. He did not recall the period of time he was unresponsive. There was no report of seizure. He was having complaints of difficulty focusing on objects. MRI of the brain showed new punctate infarct within the left cerebellum question a few other punctate infarcts in the occipital lobes. Cardiac enzymes were negative. Echocardiogram showed ejection fraction of 40% with hypokinesis of the entire anteroseptal myocardium. MRA of head and neck were unremarkable. EEG was pending. Bouts of bradycardia into the 30s while sleeping of which remained asymptomatic and followup per cardiology services and metoprolol was discontinued and remains on amiodarone. Patient remains on chronic Coumadin therapy as prior to [name] admission with intravenous heparin added until INR greater than 2.00 with latest INR of 1.66. [name] course right pleural effusion underwent thoracentesis 05/08/2011 with 1.8 L of blood-tinged fluid removed. Followup chest x-ray with no pneumothorax. Patient continues to have issues in relation to focusing and limited vision as he was seen by ophthalmology while hospitalized on 05/01/2012 exam was essentially normal with possibility of refraction. Physical and occupational therapy evaluations completed an ongoing. Noted issues in regards to being impulsive and limited awareness of deficits. M.D./PT/OT have requested physical medicine rehabilitation  consult to consider inpatient rehabilitation services. Patient was felt to be a good candidate for inpatient rehabilitation services was admitted for a comprehensive rehabilitation program  Review of Systems  Eyes: Positive for blurred vision and double vision.  Gastrointestinal: Positive for nausea and vomiting.  Neurological: Positive for weakness.  All other systems reviewed and are negative  Past Medical History   Diagnosis  Date   .  Anxiety    .  Bicuspid aortic valve    .  Aortic stenosis      mechanical AVR 04/22/12   .  Shortness of breath    .  Dysrhythmia    .  Thoracic aortic aneurysm      replacement aortic root 04/22/12   .  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  Cardiac catheterization    .  Rotator cuff repair      12 years ago, Left   .  Thoracic aortic aneurysm repair  04/22/2012     Procedure: THORACIC ASCENDING ANEURYSM REPAIR (AAA); Surgeon: [name], MD; Location: [name]; Service: Open Heart Surgery; Laterality: N/A;   .  Coronary artery bypass graft  04/22/2012     Procedure: CORONARY ARTERY BYPASS GRAFTING (CABG); Surgeon: [name], MD; Location: [facility] OR; Service: Open Heart Surgery; Laterality: N/A; times one with right greater saphenous vein graft   .  Intraoperative transesophageal echocardiogram  04/22/2012     Procedure: INTRAOPERATIVE TRANSESOPHAGEAL ECHOCARDIOGRAM; Surgeon: [name], MD; Location: [facility] OR; Service: Open Heart Surgery; Laterality: N/A;    Family History   Problem  Relation  Age of Onset   .  Lung disease  Mother    .  Diabetes  Father    .  Peripheral Artery Disease  Father    .  Hypertension  Father    Social History: reports that he has been smoking Cigarettes. He has been smoking about 2.5 packs per day. He does not have any smokeless tobacco history on file. He reports that he drinks alcohol. He reports that he does not use illicit drugs.    Allergies:  Allergies   Allergen  Reactions   .  Dilaudid (Hydromorphone Hcl)  Other (See Comments)     High doses cause itching   .  Penicillins  Other (See Comments)     Unknown    Medications Prior to Admission   Medication  Sig  Dispense  Refill   .  amiodarone (PACERONE) 400 MG tablet  Take 0.5 tablets (200 mg total) by mouth 2 (two) times daily.  60 tablet  1   .  dextromethorphan-guaiFENesin (MUCINEX DM) 30-600 MG per 12 hr tablet  Take 1 tablet by mouth 2 (two) times daily as needed. congestion     .  food thickener (RESOURCE THICKENUP CLEAR) POWD  Take 1 Can by mouth daily.     Marland Kitchen  levofloxacin (LEVAQUIN) 500 MG tablet  Take 1 tablet (500 mg total) by mouth daily.  4 tablet  0   .  metoprolol tartrate (LOPRESSOR) 25 MG tablet  Take 1 tablet (25 mg total) by mouth 2 (two) times daily.  60 tablet  1   .  pantoprazole (PROTONIX) 40 MG tablet  Take 1 tablet (40 mg total) by mouth daily.  30 tablet  1   .  traMADol (ULTRAM) 50 MG tablet  Take 50-100 mg by mouth every 6 (six) hours as needed. For pain     .  warfarin (COUMADIN) 5 MG tablet  Take 1 tablet (5 mg total) by mouth daily at 6 PM.  100 tablet  1   .  [DISCONTINUED] dextromethorphan-guaiFENesin (MUCINEX DM) 30-600 MG per 12 hr tablet  Take 1 tablet by mouth 2 (two) times daily as needed.  30 tablet  0   .  [DISCONTINUED] traMADol (ULTRAM) 50 MG tablet  Take 1-2 tablets (50-100 mg total) by mouth every 6 (six) hours as needed.  50 tablet  0   Home:  Home Living  Lives With: Spouse  Available Help at Discharge: Family  Type of Home: House  Home Access: Stairs to enter  Secretary/administrator of Steps: 4  Entrance Stairs-Rails: Right;Left  Home Layout: One level  Bathroom Shower/Tub: Sports administrator: Standard  Bathroom Accessibility: Yes  Functional History:  Prior Function  Able to Take Stairs?: Yes  Driving: Yes  Vocation: Full time employment  Comments: Publishing copy Status:   Mobility:  Bed Mobility  Bed Mobility: Supine to Sit  Supine to Sit: 7: Independent  Sit to Supine: 5: Supervision;HOB flat  Transfers  Transfers: Sit to Stand;Stand to Sit  Sit to Stand: 4: Min guard;With armrests;From chair/3-in-1  Stand to Sit: 4: Min guard;With armrests;To chair/3-in-1  Ambulation/Gait  Ambulation/Gait Assistance: 4: Min guard  Ambulation Distance (Feet): 100 Feet (100 feet with RW 100 feet with none.)  Assistive device: Rolling walker;None  Ambulation/Gait Assistance Details: Gait with RW is steady. Gait with no AD unsteady pt presents with mild lateral sway. Sway increased with cervical rotation, flexion and extension. Pt had multiple LOB episodes but able to recover independently  Gait Pattern: Wide base of  support;Decreased stride length  Stairs: No  Wheelchair Mobility  Wheelchair Mobility: No  ADL:  ADL  Grooming: Min guard  Where Assessed - Grooming: Supported standing  Upper Body Dressing: Minimal assistance  Where Assessed - Upper Body Dressing: Supported sitting  Lower Body Dressing: Minimal assistance  Where Assessed - Lower Body Dressing: Supported sit to Scientist, research (life sciences): Surveyor, minerals Method: Sit to Production manager: Bedside commode  Equipment Used: Gait belt  Transfers/Ambulation Related to ADLs: Min A with ambulation as pt impulsive and occasionally unsteady  Cognition:  Cognition  Arousal/Alertness: Awake/alert  Orientation Level: Oriented X4  Cognition  Overall Cognitive Status: Appears within functional limits for tasks assessed/performed  Arousal/Alertness: Awake/alert  Orientation Level: Appears intact for tasks assessed  Behavior During Session: [name] for tasks performed  BP 110/68  Pulse 61  Temp 98.1 F (36.7 C) (Oral)  Resp 17  Ht 5\' 11"  (1.803 m)  Wt 181 lb 3.5 oz (82.2 kg)  BMI 25.27 kg/m2  SpO2 98%  Physical Exam  Vitals reviewed. NAD Constitutional: He is oriented to person, place,  and time.  HENT: moist Head: Normocephalic and atraumatic.  Eyes: Conjunctivae normal are normal. Pupils are equal, round, and reactive to light.  Pupils reactive to light without nystagmus  Neck: Neck supple. No thyromegaly present.  Cardiovascular: Normal rate and regular rhythm.  Cardiac rate control  Pulmonary/Chest: Effort normal and breath sounds normal. He has no wheezes. Decr BS at R base. Abdominal: Soft. Bowel sounds are normal. He exhibits no distension. There is no tenderness.  Incision clean and dressed  Neurological: He is alert and oriented to person, place, and time.  Speech is mildly dysarthric but intelligible. He does follow simple commands. Noted decreased fine motor skills in RUE. Mild weakness right arm. Gaze dysconjugate. He is unable to gaze superiorly and appears to have some limitations in the inferior fields. Lateral gaze grossly intact. Limited depth perception. No focal visual field cuts. Impulsive. No sensory deficits. Patient is denying depression Skin: Skin is warm and dry.  Psychiatric: He has a normal mood and affect. His behavior is normal  R groin large NT swelling Results for orders placed during the [name] encounter of 05/05/12 (from the past 48 hour(s))   TSH Status: Abnormal    Collection Time    05/07/12 10:47 AM   Component  Value  Range  Comment    TSH  4.823 (*)  0.350 - 4.500 uIU/mL    BODY FLUID CULTURE Status: Normal (Preliminary result)    Collection Time    05/07/12 12:21 PM   Component  Value  Range  Comment    Specimen Description  PLEURAL FLUID RIGHT      Special Requests  2 SYRINGES @ 60CC      Gram Stain       Value:  WBC PRESENT,BOTH PMN AND MONONUCLEAR     NO ORGANISMS SEEN    Culture  NO GROWTH      Report Status  PENDING     BODY FLUID CELL COUNT WITH DIFFERENTIAL Status: Abnormal    Collection Time    05/07/12 12:21 PM   Component  Value  Range  Comment    Fluid Type-FCT  PLEURAL FLUID ADD ON   CORRECTED ON 01/02 AT 1610:  PREVIOUSLY REPORTED AS Body Fluid    Color, Fluid  BLOODY (*)  YELLOW     Appearance, Fluid  CLOUDY (*)  CLEAR  WBC, Fluid  1064 (*)  0 - 1000 cu mm     Neutrophil Count, Fluid  40 (*)  0 - 25 %     Lymphs, Fluid  41      Monocyte-Macrophage-Serous Fluid  19 (*)  50 - 90 %    PATHOLOGIST SMEAR REVIEW Status: Normal    Collection Time    05/07/12 12:21 PM   Component  Value  Range  Comment    Path Review  Normal and reactive mesothelial cells     PROTIME-INR Status: Abnormal    Collection Time    05/08/12 4:20 AM   Component  Value  Range  Comment    Prothrombin Time  18.2 (*)  11.6 - 15.2 seconds     INR  1.56 (*)  0.00 - 1.49    HEMOGLOBIN A1C Status: Normal    Collection Time    05/08/12 4:20 AM   Component  Value  Range  Comment    Hemoglobin A1C  5.0  <5.7 %     Mean Plasma Glucose  97  <117 mg/dL    CBC WITH DIFFERENTIAL Status: Abnormal    Collection Time    05/08/12 4:20 AM   Component  Value  Range  Comment    WBC  12.1 (*)  4.0 - 10.5 K/uL     RBC  3.01 (*)  4.22 - 5.81 MIL/uL     Hemoglobin  9.4 (*)  13.0 - 17.0 g/dL     HCT  65.7 (*)  84.6 - 52.0 %     MCV  98.0  78.0 - 100.0 fL     MCH  31.2  26.0 - 34.0 pg     MCHC  31.9  30.0 - 36.0 g/dL     RDW  96.2 (*)  95.2 - 15.5 %     Platelets  337  150 - 400 K/uL     Neutrophils Relative  79 (*)  43 - 77 %     Neutro Abs  9.6 (*)  1.7 - 7.7 K/uL     Lymphocytes Relative  10 (*)  12 - 46 %     Lymphs Abs  1.2  0.7 - 4.0 K/uL     Monocytes Relative  9  3 - 12 %     Monocytes Absolute  1.1 (*)  0.1 - 1.0 K/uL     Eosinophils Relative  1  0 - 5 %     Eosinophils Absolute  0.1  0.0 - 0.7 K/uL     Basophils Relative  0  0 - 1 %     Basophils Absolute  0.0  0.0 - 0.1 K/uL    BASIC METABOLIC PANEL Status: Abnormal    Collection Time    05/08/12 4:20 AM   Component  Value  Range  Comment    Sodium  138  135 - 145 mEq/L     Potassium  5.1  3.5 - 5.1 mEq/L  DELTA CHECK NOTED    Chloride  103  96 - 112 mEq/L     CO2  29  19 -  32 mEq/L     Glucose, Bld  92  70 - 99 mg/dL     BUN  14  6 - 23 mg/dL     Creatinine, Ser  8.41  0.50 - 1.35 mg/dL     Calcium  8.0 (*)  8.4 - 10.5 mg/dL     GFR calc non  Af Amer  >90  >90 mL/min     GFR calc Af Amer  >90  >90 mL/min    LIPID PANEL Status: Abnormal    Collection Time    05/08/12 4:20 AM   Component  Value  Range  Comment    Cholesterol  114  0 - 200 mg/dL     Triglycerides  54  <150 mg/dL     HDL  34 (*)  >16 mg/dL     Total CHOL/HDL Ratio  3.4      VLDL  11  0 - 40 mg/dL     LDL Cholesterol  69  0 - 99 mg/dL    HEPARIN LEVEL (UNFRACTIONATED) Status: Abnormal    Collection Time    05/08/12 10:15 PM   Component  Value  Range  Comment    Heparin Unfractionated  <0.10 (*)  0.30 - 0.70 IU/mL    Dg Chest 1 View  05/07/2012 *RADIOLOGY REPORT* Clinical Data: Right pleural effusion. Status post thoracentesis. CHEST - 1 VIEW Comparison: 05/06/2012 Findings: Small right pleural effusion has decreased size since previous study. No evidence of pneumothorax. There is persistent infiltrate or atelectasis in both lower lobes. Cardiomegaly is stable. Previous median sternotomy and aortic valve replacement again noted. IMPRESSION: 1. Decreased size of right pleural effusion. No pneumothorax identified. 2. Persistent bibasilar atelectasis versus infiltrates. 3. Stable cardiomegaly. Original Report Authenticated By: Myles Rosenthal, M.D.  Mr [name], Jon [name]  05/08/2012 *RADIOLOGY REPORT* Clinical Data: 47 year old male with type A dissection repair. Visual disturbance. Suggestion of posterior circulation emboli on recent MRI. Comparison: Brain MRIs 05/06/2012, 05/02/2012. [name]: 18 ml MultiHance which was extravasated during attempted postcontrast MRA imaging. No postcontrast imaging was obtained. The patient tolerated this well and was in no acute distress. Standard extravasation treatment guidelines were followed. MRA NECK WITHOUT [name] Technique: Angiographic images of the neck were obtained  using MRA technique without intravenous [name]. Findings: Layering bilateral pleural effusions are evident in the upper chest. Antegrade flow in the bilateral cervical carotid arteries. Carotid bifurcations are within normal limits for noncontrast technique. Antegrade flow in the left vertebral artery with no abnormality evident. There is intermittently decreased flow signal in the right vertebral artery throughout the neck. Initially, the right vertebral artery caliber appears similar to that on the left (series 4 image 92). In the distal V2 segment and at the skull base the vertebral artery caliber appears significantly reduced. IMPRESSION: 1. Intermittently irregular appearance and decreased flow in the right vertebral artery throughout the neck. The findings in this setting could reflect right vertebral artery thromboembolic disease or vertebral artery dissection. Neck CTA may characterize further as clinically necessary. 2. Otherwise negative noncontrast neck MRA. 3. Intracranial findings are below. 4. Layering pleural effusions. MRA HEAD WITHOUT [name] Technique: Angiographic images of the Circle of Willis were obtained using MRA technique without intravenous [name]. Findings: Decreased antegrade flow in the distal right vertebral artery in keeping with the neck MRA findings. Robust antegrade flow in the distal left vertebral artery. Flow in both PICA origins is preserved. Vertebrobasilar junction is patent. Right AICA flow is evident. No basilar artery stenosis. SCA is duplicated on the right. SCA and PCA origins are within normal limits. Bilateral PCA branches are within normal limits. Antegrade flow in both ICA siphons. No ICA stenosis. Ophthalmic artery origins are normal. Posterior communicating arteries are diminutive or absent. Normal carotid termini. MCA and ACA origins are normal. Normal anterior communicating artery and visualized ACA branches. Visualized bilateral  MCA branches are within  normal limits. IMPRESSION: 1. Decreased flow in the distal right vertebral artery in keeping with the neck MRA findings. Flow at the right PICA origin appears preserved. 2. Otherwise negative posterior circulation, and otherwise negative intracranial MRA. Original Report Authenticated By: Erskine Speed, M.D.  Mr Angiogram Neck Wo [name]  05/08/2012 *RADIOLOGY REPORT* Clinical Data: 47 year old male with type A dissection repair. Visual disturbance. Suggestion of posterior circulation emboli on recent MRI. Comparison: Brain MRIs 05/06/2012, 05/02/2012. [name]: 18 ml MultiHance which was extravasated during attempted postcontrast MRA imaging. No postcontrast imaging was obtained. The patient tolerated this well and was in no acute distress. Standard extravasation treatment guidelines were followed. MRA NECK WITHOUT [name] Technique: Angiographic images of the neck were obtained using MRA technique without intravenous [name]. Findings: Layering bilateral pleural effusions are evident in the upper chest. Antegrade flow in the bilateral cervical carotid arteries. Carotid bifurcations are within normal limits for noncontrast technique. Antegrade flow in the left vertebral artery with no abnormality evident. There is intermittently decreased flow signal in the right vertebral artery throughout the neck. Initially, the right vertebral artery caliber appears similar to that on the left (series 4 image 92). In the distal V2 segment and at the skull base the vertebral artery caliber appears significantly reduced. IMPRESSION: 1. Intermittently irregular appearance and decreased flow in the right vertebral artery throughout the neck. The findings in this setting could reflect right vertebral artery thromboembolic disease or vertebral artery dissection. Neck CTA may characterize further as clinically necessary. 2. Otherwise negative noncontrast neck MRA. 3. Intracranial findings are below. 4. Layering pleural effusions. MRA  HEAD WITHOUT [name] Technique: Angiographic images of the Circle of Willis were obtained using MRA technique without intravenous [name]. Findings: Decreased antegrade flow in the distal right vertebral artery in keeping with the neck MRA findings. Robust antegrade flow in the distal left vertebral artery. Flow in both PICA origins is preserved. Vertebrobasilar junction is patent. Right AICA flow is evident. No basilar artery stenosis. SCA is duplicated on the right. SCA and PCA origins are within normal limits. Bilateral PCA branches are within normal limits. Antegrade flow in both ICA siphons. No ICA stenosis. Ophthalmic artery origins are normal. Posterior communicating arteries are diminutive or absent. Normal carotid termini. MCA and ACA origins are normal. Normal anterior communicating artery and visualized ACA branches. Visualized bilateral MCA branches are within normal limits. IMPRESSION: 1. Decreased flow in the distal right vertebral artery in keeping with the neck MRA findings. Flow at the right PICA origin appears preserved. 2. Otherwise negative posterior circulation, and otherwise negative intracranial MRA. Original Report Authenticated By: Erskine Speed, M.D.  Dg Swallowing Func-speech Pathology  05/08/2012 Lenor Derrick, CCC-SLP 05/08/2012 4:08 PM Objective Swallowing Evaluation: Modified Barium Swallowing Study Patient Details Name: Jon [name] MRN: [id number] Date of Birth: 1966-03-20 Today's Date: 05/08/2012 Time: - Past Medical History: Past Medical History Diagnosis Date . Anxiety . Bicuspid aortic valve . Aortic stenosis mechanical AVR 04/22/12 . Shortness of breath . Dysrhythmia . Thoracic aortic aneurysm replacement aortic root 04/22/12 . CVA (cerebral infarction) occipital CVA 12/13 Past Surgical History: Past Surgical History Procedure Date . Cardiac catheterization . Rotator cuff repair 12 years ago, Left . Thoracic aortic aneurysm repair 04/22/2012 Procedure: THORACIC ASCENDING  ANEURYSM REPAIR (AAA); Surgeon: [name], MD; Location: [facility] OR; Service: Open Heart Surgery; Laterality: N/A; . Coronary artery bypass graft 04/22/2012 Procedure: CORONARY ARTERY BYPASS GRAFTING (CABG); Surgeon: [name], MD; Location: [name] OR;  Service: Open Heart Surgery; Laterality: N/A; times one with right greater saphenous vein graft . Intraoperative transesophageal echocardiogram 04/22/2012 Procedure: INTRAOPERATIVE TRANSESOPHAGEAL ECHOCARDIOGRAM; Surgeon: [name], MD; Location: [facility] OR; Service: Open Heart Surgery; Laterality: N/A; HPI: Jon [name] is a 47 y.o. male presenting with difficulty focusing on objects,nausea, vomiting, fall, non-responsive for short time but then could get him to partially respond, after a few minutes he came back to his normal state. He did not remember the occurrence. She visualized no shaking, no incontinence but that his eyes rolled back in his head and he was diaphorectic. Imaging reveals definite new punctate infarction within the left cerebellum and with possible punctate infarctions in occiptal lobes consistent with micro-emboli. Work up underway. On warfarin prior to admission. Now on warfarin for secondary stroke prevention. Patient with resultant resolved symptoms in ER. During last [name] stay pt had MBS showing aspiratin of thin liquids improved with chin tuck or with nectar thick consistency. 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 Pt tolerated this diet until d/c with wife demonstrating understanding of diet and precautions. Upon d/c wife reports pt with impulsivity, having some choking with nectar thick liquids. At this time his CXR has improved since d/c. Assessment / Plan / Recommendation Clinical Impression Dysphagia Diagnosis: Mild pharyngeal phase dysphagia Clinical impression: Patient with improved swallow function when compared with the study on 05/01/12, however, patient now with a  mild pharyngeal dysphagia, characterized by reduced epiglottic inversion, anterior hyoid movement, and laryngeal elevation, resulting in penetration of both thin and nectar thick liquids. There was no penetration when a chin tuck head position was utilized. There was no delay to initiate the swallow, and no laryngeal residue. No aspiration was observed with any consistenciy. Patient remains impulsive, and will require full supervision, at least initially, to ensure small bites/sips, and use of chin tuck. Treatment Recommendation Therapy as outlined in treatment plan below Diet Recommendation Regular;Thin liquid Liquid Administration via: Cup;No straw Medication Administration: Whole meds with puree Supervision: Patient able to self feed;Full supervision/cueing for compensatory strategies Compensations: Slow rate;Small sips/bites Postural Changes and/or Swallow Maneuvers: Chin tuck Other Recommendations Recommended Consults: (FEES equipment is down. MBS was completed instead.) Oral Care Recommendations: Oral care BID Follow Up Recommendations Inpatient Rehab Frequency and Duration min 2x/week 2 weeks Pertinent Vitals/Pain n/a SLP Swallow Goals Patient will consume recommended diet without observed clinical signs of aspiration with: Minimal assistance;Supervision/safety Patient will utilize recommended strategies during swallow to increase swallowing safety with: Minimal assistance;Supervision/safety General HPI: Jon [name] is a 47 y.o. male presenting with difficulty focusing on objects,nausea, vomiting, fall, non-responsive for short time but then could get him to partially respond, after a few minutes he came back to his normal state. He did not remember the occurrence. She visualized no shaking, no incontinence but that his eyes rolled back in his head and he was diaphorectic. Imaging reveals definite new punctate infarction within the left cerebellum and with possible punctate infarctions in occiptal  lobes consistent with micro-emboli. Work up underway. On warfarin prior to admission. Now on warfarin for secondary stroke prevention. Patient with resultant resolved symptoms in ER. During last [name] stay pt had MBS showing aspiratin of thin liquids improved with chin tuck or with nectar thick consistency. 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 Pt tolerated this diet until d/c with wife demonstrating understanding of diet and precautions. Upon d/c wife reports pt with impulsivity, having some choking with nectar thick liquids. At this time  his CXR has improved since d/c. 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 This impairment leads to consistent aspiration of thin liquids, accompanied by a nonprotective cough response. Postural adjustments (head turn to left and right) increased aspiration; a chin tuck resulted in liquids penetrating the larynx but not descending below the vocal cords. 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 After lengthy discussion with pt and spouse, rec: nectar-thick liquids OR thin liquids with use of a chin-tuck; meds whole with puree; Diet Prior to this Study: Regular;Nectar-thick liquids Temperature Spikes Noted: No Respiratory Status: Room air History of Recent Intubation: Yes Length of Intubations (days): 1 days Date extubated: 04/27/12 Behavior/Cognition: Alert;Impulsive;Decreased sustained attention Oral Cavity - Dentition: Adequate natural dentition Oral Motor / Sensory Function: Within functional limits Self-Feeding Abilities: Able to feed self;Needs assist Patient Positioning: Upright in chair Baseline Vocal Quality: Hoarse Volitional Cough: Strong;Congested Volitional Swallow: Able to elicit Anatomy:  Within functional limits Pharyngeal Secretions: Not observed secondary MBS Reason for Referral Objectively evaluate swallowing function Oral Phase Oral Preparation/Oral Phase Oral Phase: WFL Pharyngeal Phase Pharyngeal Phase Pharyngeal Phase: Impaired Pharyngeal - Nectar Pharyngeal - Nectar Teaspoon: Reduced laryngeal elevation;Reduced epiglottic inversion;Reduced anterior laryngeal mobility;Penetration/Aspiration during swallow Pharyngeal - Nectar Cup: Reduced epiglottic inversion;Reduced anterior laryngeal mobility;Reduced laryngeal elevation;Penetration/Aspiration during swallow Pharyngeal - Thin Pharyngeal - Thin Teaspoon: Reduced anterior laryngeal mobility;Reduced laryngeal elevation;Reduced epiglottic inversion;Penetration/Aspiration during swallow Pharyngeal - Thin Cup: Reduced epiglottic inversion;Reduced anterior laryngeal mobility;Reduced laryngeal elevation;Penetration/Aspiration during swallow Penetration/Aspiration details (thin cup): Material enters airway, CONTACTS cords and not ejected out Cervical Esophageal Phase GO Cervical Esophageal Phase Cervical Esophageal Phase: Vicente Masson T 05/08/2012, 4:08 PM  US Thoracentesis Asp Pleural Space W/img Guide  05/08/2012 *RADIOLOGY REPORT* Clinical Data: Patient status post aortic dissection repair, aortic valve placement, CABG. Now with right pleural effusion. Request is made for diagnostic and therapeutic right thoracentesis. ULTRASOUND GUIDED DIAGNOSTIC AND THERAPEUTIC RIGHT THORACENTESIS An ultrasound guided thoracentesis was thoroughly discussed with the patient and questions answered. The benefits, risks, alternatives and complications were also discussed. The patient understands and wishes to proceed with the procedure. Written consent was obtained. Ultrasound was performed to localize and mark an adequate pocket of fluid in the right chest. The area was then prepped and draped in the normal sterile fashion. 1% Lidocaine was used for local  anesthesia. Under ultrasound guidance a 19 gauge Yueh catheter was introduced. Thoracentesis was performed. The catheter was removed and a dressing applied. Complications: none Findings: A total of approximately 1.8 liters of blood-tinged fluid was removed. A fluid sample was sent for culture. IMPRESSION: Successful ultrasound guided diagnostic and therapeutic right thoracentesis yielding 1.8 liters of pleural fluid. Read by: Jeananne Rama, P.A.-C Original Report Authenticated By: Irish Lack, M.D.  Post Admission Physician Evaluation:  1. Functional deficits secondary to embolic cerebellar and occipital lobe infarcts. 2. Patient is admitted to receive collaborative, interdisciplinary care between the physiatrist, rehab nursing staff, and therapy team. 3. Patient's level of medical complexity and substantial therapy needs in context of that medical necessity cannot be provided at a lesser intensity of care such as a SNF. 4. Patient has experienced substantial functional loss from his/her baseline which was documented above under the "Functional History" and "Functional Status" headings. Judging by the patient's diagnosis, physical exam, and functional history, the patient has potential for functional progress which will  result in measurable gains while on inpatient rehab. These gains will be of substantial and practical use upon discharge in facilitating mobility and self-care at the household level. 5. Physiatrist will provide 24 hour management of medical needs as well as oversight of the therapy plan/treatment and provide guidance as appropriate regarding the interaction of the two. 6. 24 hour rehab nursing will assist with bladder management, bowel management, safety, skin/wound care, disease management, medication administration, pain management and patient education and help integrate therapy concepts, techniques,education, etc. 7. PT will assess and treat for: Lower extremity strength, range of  motion, stamina, balance, functional mobility, safety, adaptive techniques, visual deficits and equipment, NMR, vestibular deficits, education. Goals are: mod I to supervision. 8. OT will assess and treat for: ADL's, functional mobility, safety, upper extremity strength, adaptive techniques and equipment, NMR, vestibular and visual deficits. Goals are: mod I to supervision. 9. SLP will assess and treat for: n/a. Goals are: n/a. 10. Case Management and Social Worker will assess and treat for psychological issues and discharge planning. 11. Team conference will be held weekly to assess progress toward goals and to determine barriers to discharge. 12. Patient will receive at least 3 hours of therapy per day at least 5 days per week. 13. ELOS: 5-7 days Prognosis: excellent Medical Problem List and Plan:  1. embolic cerebellar and occipital lobe infarcts  2. DVT Prophylaxis/Anticoagulation: Chronic Coumadin therapy. INR today of 1.66. Continue intravenous heparin to IR greater than 2.00 per pharmacy  Monitor for a bleeding episodes  3. Pain Management: Percocet one tablet every 4 hours as needed severe pain. Monitor the increased mobility  4. Neuropsych: This patient is capable of making decisions on his/her own behalf.  5. 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 Followup per cardiology services  6. Hypertension. Amiodarone 200 mg twice a day.  7. Right pleural effusion. Status post thoracentesis 05/08/2011. Followup chest x-ray with no pneumothorax. Check oxygen saturations every shift 8. New cough. Afebrile. CXR - better. Labs reviewed. Albuterol/Atrovent. Cough med. No need for abx now 9. R groin hematoma (most likely) - s/p vein harvest. Dr Laneta Simmers will see the pt today. Sand bag. Hold ASA 9. Elev. D-dimer -- post-op.

## 2012-05-12 ENCOUNTER — Inpatient Hospital Stay (HOSPITAL_COMMUNITY): Payer: Self-pay | Admitting: Occupational Therapy

## 2012-05-12 ENCOUNTER — Inpatient Hospital Stay (HOSPITAL_COMMUNITY): Payer: Medicaid Other | Admitting: Physical Therapy

## 2012-05-12 ENCOUNTER — Inpatient Hospital Stay (HOSPITAL_COMMUNITY): Payer: Medicaid Other | Admitting: *Deleted

## 2012-05-12 ENCOUNTER — Inpatient Hospital Stay (HOSPITAL_COMMUNITY): Payer: Medicaid Other | Admitting: Speech Pathology

## 2012-05-12 DIAGNOSIS — I6350 Cerebral infarction due to unspecified occlusion or stenosis of unspecified cerebral artery: Secondary | ICD-10-CM

## 2012-05-12 DIAGNOSIS — I10 Essential (primary) hypertension: Secondary | ICD-10-CM

## 2012-05-12 DIAGNOSIS — I6340 Cerebral infarction due to embolism of unspecified cerebral artery: Secondary | ICD-10-CM

## 2012-05-12 LAB — BASIC METABOLIC PANEL
BUN: 9 mg/dL (ref 6–23)
Chloride: 101 mEq/L (ref 96–112)
GFR calc Af Amer: >90 mL/min (ref >90)
GFR calc non Af Amer: >90 mL/min (ref >90)
Glucose, Bld: 108 mg/dL — ABNORMAL HIGH (ref 70–99)
Potassium: 4.4 mEq/L (ref 3.5–5.1)
Sodium: 137 mEq/L (ref 135–145)

## 2012-05-12 LAB — CBC WITH DIFFERENTIAL/PLATELET
Basophils Absolute: 0 10*3/uL (ref 0.0–0.1)
Basophils Relative: 1 % (ref 0–1)
Eosinophils Absolute: 0.2 10*3/uL (ref 0.0–0.7)
Eosinophils Relative: 2 % (ref 0–5)
HCT: 29.8 % — ABNORMAL LOW (ref 39.0–52.0)
MCH: 31.1 pg (ref 26.0–34.0)
MCHC: 32.6 g/dL (ref 30.0–36.0)
MCV: 95.5 fL (ref 78.0–100.0)
Monocytes Absolute: 0.7 10*3/uL (ref 0.1–1.0)
Monocytes Relative: 9 % (ref 3–12)
Neutro Abs: 5.6 10*3/uL (ref 1.7–7.7)
RDW: 19.1 % — ABNORMAL HIGH (ref 11.5–15.5)

## 2012-05-12 LAB — CBC
HCT: 33.6 % — ABNORMAL LOW (ref 39.0–52.0)
Hemoglobin: 10.7 g/dL — ABNORMAL LOW (ref 13.0–17.0)
MCHC: 31.8 g/dL (ref 30.0–36.0)
WBC: 9.4 10*3/uL (ref 4.0–10.5)

## 2012-05-12 LAB — COMPREHENSIVE METABOLIC PANEL
AST: 48 U/L — ABNORMAL HIGH (ref 0–37)
Albumin: 2.3 g/dL — ABNORMAL LOW (ref 3.5–5.2)
BUN: 9 mg/dL (ref 6–23)
Calcium: 8.3 mg/dL — ABNORMAL LOW (ref 8.4–10.5)
Creatinine, Ser: 0.81 mg/dL (ref 0.50–1.35)
Total Protein: 5.4 g/dL — ABNORMAL LOW (ref 6.0–8.3)

## 2012-05-12 LAB — PROTIME-INR
INR: 2.48 — ABNORMAL HIGH (ref 0.00–1.49)
Prothrombin Time: 25.7 seconds — ABNORMAL HIGH (ref 11.6–15.2)

## 2012-05-12 MED ORDER — WARFARIN SODIUM 5 MG PO TABS
5.0000 mg | ORAL_TABLET | Freq: Once | ORAL | Status: AC
  Administered 2012-05-12: 5 mg via ORAL
  Filled 2012-05-12 (×2): qty 1

## 2012-05-12 MED ORDER — CARBAMIDE PEROXIDE 6.5 % OT SOLN
5.0000 [drp] | Freq: Two times a day (BID) | OTIC | Status: AC
  Administered 2012-05-12 – 2012-05-15 (×6): 5 [drp] via OTIC
  Filled 2012-05-12: qty 15

## 2012-05-12 NOTE — Progress Notes (Signed)
Patient information reviewed and entered into eRehab system by Remie Mathison, RN, CRRN, PPS Coordinator.  Information including medical coding and functional independence measure will be reviewed and updated through discharge.    

## 2012-05-12 NOTE — Progress Notes (Signed)
Occupational Therapy Session Note  Patient Details  Name: Jon [name] MRN: [id number] Date of Birth: November 15, 1965  Today's Date: 05/12/2012 Time: 1115-1200 Time Calculation (min): 45 min  Short Term Goals: Week 1:  OT Short Term Goal 1 (Week 1): STGs = LTGs due to short LOS  Skilled Therapeutic Interventions/Progress Updates:    1:1 focus on functional functional ambulation with cuing to pace self and go slow, negotiated an obstacle coursecc (walkinging on mat (uneven surface), walking around cones on floor, stepping up on to block step, and picking up objects on floor),  Floor transfer while maintaining sternal precautions, and visual scanning activity with board with extra time. Pt with eye alignment may be causing blurriness and with increased eye fatigue worsen.   Therapy Documentation Precautions:  Precautions Precautions: Fall Precaution Comments: severe vision deficits, impulsive Restrictions Weight Bearing Restrictions: No Pain:  noc/opain  See FIM for current functional status  Therapy/Group: Individual Therapy  Roney Mans [facility] 05/12/2012, 4:10 PM

## 2012-05-12 NOTE — Progress Notes (Signed)
[facility] Individual Statement of Services  Patient Name:  Jon [name]  Date:  05/12/2012  Welcome to the [facility].  Our goal is to provide you with an individualized program based on your diagnosis and situation, designed to meet your specific needs.  With this comprehensive rehabilitation program, you will be expected to participate in at least 3 hours of rehabilitation therapies Monday-Friday, with modified therapy programming on the weekends.  Your rehabilitation program will include the following services:  Physical Therapy (PT), Occupational Therapy (OT), Speech Therapy (ST), 24 hour per day rehabilitation nursing, Therapeutic Recreaction (TR), Neuropsychology, Case Management (Social Worker), Rehabilitation Medicine, Nutrition Services and Pharmacy Services  Weekly team conferences will be held on Tuesdays to discuss your progress.  Your  Social Worker will talk with you frequently to get your input and to update you on team discussions.  Team conferences with you and your family in attendance may also be held.  Expected length of stay: 7-10 days  Overall anticipated outcome: supervision  Depending on your progress and recovery, your program may change.  Your  Social Worker will coordinate services and will keep you informed of any changes.  Your  Social Worker's name and contact numbers are listed  below.  The following services may also be recommended but are not provided by the [facility]:   Driving Evaluations  Home Health Rehabiltiation Services  Outpatient Rehabilitatation [facility]  Vocational Rehabilitation   Arrangements will be made to provide these services after discharge if needed.  Arrangements include referral to agencies that provide these services.  Your insurance has been verified to be:  None currently - Medicaid application pending Your primary doctor is:  Dr. Samuel Germany  Pertinent information  will be shared with your doctor and your insurance company.   Social Worker:  [place], [place] [phone] or [phone]  Information discussed with and copy given to patient by: Amada Jupiter, 05/12/2012, 3:18 PM

## 2012-05-12 NOTE — Progress Notes (Signed)
Physical Therapy Session Note  Patient Details  Name: Jon [name] MRN: [id number] Date of Birth: Dec 16, 1965  Today's Date: 05/12/2012 Time: 4098-1191 Time Calculation (min): 46 min  Short Term Goals: Week 1:  STGs=LTGs secondary to short LOS  Skilled Therapeutic Interventions/Progress Updates:    Patient received sitting in recliner with wife present. Extensive conversation with patient and wife about safety, falls precautions, patient's vision and balance deficits, his tendency to be impulsive and his decreased safety awareness secondary to patient's increased agitation with nursing staff this AM when they attempted to provide assistance. Patient states multiple times to "train me like a blind man so I can learn to do what I need to do and then if my vision comes back, I'll be better than I was before." Discussion about compensatory strategies for impaired vision during functional mobility to maximize safety. Discussion about slowing down to increase safety.  Patient instructed in gait training 150' x3, 250' x1 (50' of 250' on carpet) without AD with supervision and intermittent min guard secondary to increased sway and patient with difficulty maintaining straight path during gait training. During ambulation, patient instructed in compensatory strategies for vision deficits to increase safety. Patient instructed to focus gaze wherever he feels gives him the biggest visual field and wherever he can see the most objects/details. Patient able to negotiate several objects in busy hallways (people, carts, chairs, wheelchairs, counters) and experienced no overt LOB. Patient able to identify when he is approaching object that he must negotiate. Noted improved safety awareness when, several times, patient stops during gait training, stating he is "waiting for his vision to come back." Reinforced to patient and wife that this is a good safety strategy and that if patient feels his vision suddenly gets  worse, stopping what he is doing and waiting for improvement or possibly stopping what he is doing altogether if it doesn't improve, is a safe practice.  Noted increased SOB at the end of each bout of gait training. Patient and wife report it is due to increased anxiety from his loss of vision. Patient and wife also stating the need for increased endurance training. Patient instructed in negotiation of 8 stairs with R handrail ascending and L handrail descending. Patient uses reciprocal pattern and requires supervision/verbal cues for proper sequencing and technique.  Discussion with patient and wife about safety while in the hospital and the need to call for assistance when using the bathroom, etc. Patient expressing frustration with having to call for assistance, but agreeable to safety practices and need for assistance. Patient returned to room, sitting in recliner, and handed off to speech therapy.  Therapy Documentation Precautions:  Precautions Precautions: Fall Precaution Comments: severe vision deficits, impulsive Restrictions Weight Bearing Restrictions: No Pain: Pain Assessment Pain Assessment: No/denies pain Pain Score: 0-No pain Locomotion : Ambulation Ambulation/Gait Assistance: 4: Min guard Wheelchair Mobility Distance: 0   See FIM for current functional status  Therapy/Group: Individual Therapy  Chipper Herb. Tuwanna Krausz, PT, DPT  05/12/2012, 9:20 AM

## 2012-05-12 NOTE — Progress Notes (Signed)
Physical Therapy Note  Patient Details  Name: Jon [name] MRN: [id number] Date of Birth: [birth year] Today's Date: 05/12/2012  1500-1550 (50 minutes) individual Pain: no reported pain Focus of treatment: Therapeutic exercise for activity tolerance; gait training on tile,carpet Treatment; pt in bed upon arrival. Gait to gym 120 feet SBA with vcs to decrease gait speed for safety. Pt complied; Nustep Level 7 LEs only 2 X 5 minutes with Oxygen sats > 92% pulse 94; up/down 4 steps with /without rails SBA; gait 200 feet SBA with increased SOB and stagger to left.    Jon [name],JIM 05/12/2012, 7:29 AM

## 2012-05-12 NOTE — Progress Notes (Signed)
Subjective/Complaints: Had a good weekend. Complains of ringing in his right ear.  Denies headache.  A 12 point review of systems has been performed and if not noted above is otherwise negative.   Objective: Vital Signs: Blood pressure 103/71, pulse 65, temperature 97.7 F (36.5 C), temperature source Oral, resp. rate 19, height 5\' 11"  (1.803 m), weight 81.4 kg (179 lb 7.3 oz), SpO2 96.00%. Dg Chest 2 View  05/10/2012  *RADIOLOGY REPORT*  Clinical Data: Cough with hemoptysis.  CHEST - 2 VIEW  Comparison: One-view chest x-ray 05/07/2012.  Two-view chest x-ray 05/06/2012.  Findings: Prior sternotomy for aortic valve replacement.  Cardiac silhouette enlarged but stable.  Mild pulmonary venous hypertension without overt edema.  Stable bilateral pleural effusions, right greater than left, dating back to 05/06/2012.  Associated mild passive atelectasis in the lower lobes with improved aeration since that examination.  Lungs otherwise clear.  Mild degenerative changes involving the thoracic spine.  IMPRESSION: Stable bilateral pleural effusions since 05/06/2012, right greater than left. Mild passive atelectasis in the lower lobes, with improved aeration since that time.  Mild pulmonary venous hypertension without overt edema.  No new abnormalities.   Original Report Authenticated By: Hulan Saas, M.D.     Basename 05/12/12 0456 05/11/12 1110  WBC 7.4 8.9  HGB 9.7* 10.4*  HCT 29.8* 32.4*  PLT 311 371    Basename 05/12/12 0456 05/11/12 1110  NA 139 138  K 4.3 4.0  CL 102 102  GLUCOSE 92 84  BUN 9 8  CREATININE 0.81 0.74  CALCIUM 8.3* 8.3*   CBG (last 3)  No results found for this basename: GLUCAP:3 in the last 72 hours  Wt Readings from Last 3 Encounters:  05/12/12 81.4 kg (179 lb 7.3 oz)  05/09/12 84.414 kg (186 lb 1.6 oz)  05/03/12 83.3 kg (183 lb 10.3 oz)    Physical Exam:  HENT: right ear externally intact Head: Normocephalic and atraumatic.  Eyes: Conjunctivae normal are  normal. Pupils are equal, round, and reactive to light.  Pupils reactive to light without nystagmus  Neck: Neck supple. No thyromegaly present.  Cardiovascular: Normal rate and regular rhythm.  Cardiac rate control  Pulmonary/Chest: Effort normal and breath sounds normal. He has no wheezes.  Abdominal: Soft. Bowel sounds are normal. He exhibits no distension. There is no tenderness.  Incision clean and dressed  Neurological: He is alert and oriented to person, place, and time.  Speech is mildly dysarthric but intelligible. He does follow simple commands. Noted decreased fine motor skills in RUE. Mild weakness right arm. Gaze dysconjugate. He is still unable to gaze superiorly and appears to have some limitations in the inferior fields. Lateral gaze grossly intact. Limited depth perception. No focal visual field cuts. Moves a little impulsively. Balance improving. No sensory deficits.  Skin: Skin is warm and dry. Hearing a little less through the right ear. Psychiatric: He has a normal mood and affect. In good spirits. His behavior is normal   Assessment/Plan: 1. Functional deficits secondary to embolic occipital and cerebellar infarcts which require 3+ hours per day of interdisciplinary therapy in a comprehensive inpatient rehab setting. Physiatrist is providing close team supervision and 24 hour management of active medical problems listed below. Physiatrist and rehab team continue to assess barriers to discharge/monitor patient progress toward functional and medical goals. FIM: FIM - Bathing Bathing Steps Patient Completed:  (refused bath) Bathing: 4: Steadying assist  FIM - Upper Body Dressing/Undressing Upper body dressing/undressing: 0: Wears gown/pajamas-no public clothing FIM -  Lower Body Dressing/Undressing Lower body dressing/undressing steps patient completed: Don/Doff left sock;Don/Doff right sock;Thread/unthread right pants leg;Thread/unthread left pants leg;Pull pants  up/down Lower body dressing/undressing: 4: Steadying Assist  FIM - Toileting Toileting steps completed by patient: Adjust clothing prior to toileting;Performs perineal hygiene;Adjust clothing after toileting Toileting Assistive Devices: Grab bar or rail for support Toileting: 4: Steadying assist  FIM - Archivist Transfers: 5-To toilet/BSC: Supervision (verbal cues/safety issues);5-From toilet/BSC: Supervision (verbal cues/safety issues)  FIM - Bed/Chair Transfer Bed/Chair Transfer: 7: Independent: No helper;5: Bed > Chair or W/C: Supervision (verbal cues/safety issues);5: Chair or W/C > Bed: Supervision (verbal cues/safety issues)  FIM - Locomotion: Wheelchair Locomotion: Wheelchair: 0: Activity did not occur FIM - Locomotion: Ambulation Ambulation/Gait Assistance: 4: Min guard Locomotion: Ambulation: 4: Travels 150 ft or more with minimal assistance (Pt.>75%)  Comprehension Comprehension Mode: Auditory Comprehension: 5-Understands basic 90% of the time/requires cueing < 10% of the time  Expression Expression Mode: Verbal Expression: 5-Expresses basic 90% of the time/requires cueing < 10% of the time.  Social Interaction Social Interaction: 5-Interacts appropriately 90% of the time - Needs monitoring or encouragement for participation or interaction.  Problem Solving Problem Solving: 5-Solves basic 90% of the time/requires cueing < 10% of the time  Memory Memory: 5-Recognizes or recalls 90% of the time/requires cueing < 10% of the time  Medical Problem List and Plan:  1. embolic cerebellar and occipital lobe infarcts  2. DVT Prophylaxis/Anticoagulation: Chronic Coumadin therapy. INR today of 1.66. Continue intravenous heparin to IR greater than 2.00 Monitor for a bleeding episodes  3. Pain Management: Percocet one tablet every 4 hours as needed severe pain. Monitor the increased mobility  4. Neuropsych: This patient is capable of making decisions on his/her own  behalf.  5. 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 Followup per cardiology services  6. Hypertension. Amiodarone 200 mg twice a day.  7. Right pleural effusion. Status post thoracentesis 05/08/2011. Followup chest x-ray with no pneumothorax. Off O2 8. Right ear "buzzing"--will check middle ear later this am  -may be fluid or inner ear problem  -doubt it's stroke related.  LOS (Days) 3 A FACE TO FACE EVALUATION WAS PERFORMED  SWARTZ,ZACHARY T 05/12/2012 9:14 AM

## 2012-05-12 NOTE — Progress Notes (Signed)
Social Work  Social Work Assessment and Plan  Patient Details  Name: Jon [name] MRN: [id number] Date of Birth: Jul 12, 1965  Today's Date: 05/12/2012  Problem List:  Patient Active Problem List  Diagnosis  . HCAP (healthcare-associated pneumonia)  . Syncope  . Anxiety  . Aortic stenosis  . Aortic aneurysm and dissection  . Thoracic aortic aneurysm  . Stroke, embolic  . CVA (cerebral infarction)   Past Medical History:  Past Medical History  Diagnosis Date  . Anxiety   . Bicuspid aortic valve   . Aortic stenosis     mechanical AVR 04/22/12  . Shortness of breath   . Dysrhythmia   . Thoracic aortic aneurysm     replacement aortic root 04/22/12  . 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 Cardiac catheterization   . Rotator cuff repair     12 years ago, Left  . Thoracic aortic aneurysm repair 04/22/2012    Procedure: THORACIC ASCENDING ANEURYSM REPAIR (AAA);  Surgeon: [name], MD;  Location: [facility] OR;  Service: Open Heart Surgery;  Laterality: N/A;  . Coronary artery bypass graft 04/22/2012    Procedure: CORONARY ARTERY BYPASS GRAFTING (CABG);  Surgeon: [name], MD;  Location: [facility] OR;  Service: Open Heart Surgery;  Laterality: N/A;  times one with right greater saphenous vein graft  . Intraoperative transesophageal echocardiogram 04/22/2012    Procedure: INTRAOPERATIVE TRANSESOPHAGEAL ECHOCARDIOGRAM;  Surgeon: [name], MD;  Location: [facility] OR;  Service: Open Heart Surgery;  Laterality: N/A;   Social History:  reports that he has been smoking Cigarettes.  He has been smoking about 2.5 packs per day. He does not have any smokeless tobacco history on file. He reports that he drinks alcohol. He reports that he does not use illicit drugs.  Family / Support Systems Marital Status: Married How Long?: 27 years Patient Roles: Spouse Spouse/Significant Other: wife,  Willi Borowiak @ [phone] Children: pt and wife have two adult daughters - one living out side of the home and the other living with patient and wife along with her spouse and young children. Other Supports: pt's parents and extended family all living in [place]. Anticipated Caregiver: Wife: Donnelle Olmeda Ability/Limitations of Caregiver: none Caregiver Availability: 24/7 Family Dynamics: Wife very attentive and supportive.  Notes many family members in the home and willing to share in supporting patient upon d/c.  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 Repetitive at times when he is trying to "...make a point..." per wife.  Wife states, "what you see here is him but just alot worse" with attentive and hyperactivity.  Pt very focused on a CNA who had angered him earlier in the day and required redirection a couple times away from topic. Recent Psychosocial Issues: None Pyschiatric History: Pt's wife reports pt has been on Paxil several years ago "for anxiety" but "...he took himself off of it" Substance Abuse History:  Pt with h/o crack cocaine abuse for approx 10 years.  Wife reports pt has been "clean" for 5 years "... and he's really proud of that".  Patient / Family Perceptions, Expectations & Goals Pt/Family understanding of illness & functional limitations: Pt and wife with  very basic understanding of stroke and areas of brain affected.  Both note they are unsure what to anticipate for longer term recovery.  Pt reports his visual deficits "... keep changing... I figure out what's going on with it!" Premorbid pt/family roles/activities: Pt was working as a Curator and independent. All adult members of the house are sharing in paying the bills. Anticipated changes in roles/activities/participation: Pt will likely require 24/7 supervision initially.  His income to be lost for the time being - await either able to return to work or approval of SSD Pt/family expectations/goals: "I want to be able to go home by Friday.  Need to get some (financial) help"  [employer]: None Premorbid Home Care/DME Agencies: None Transportation available at discharge: yes Resource referrals recommended: Neuropsychology;Support group (specify)  Discharge Planning Living Arrangements: Spouse/significant other;Children;Other relatives Support Systems: Spouse/significant other;Children;Other relatives Type of Residence: Private residence Insurance Resources: Customer service manager Resources: Employment Financial Screen Referred: Previously completed Living Expenses: Psychologist, sport and exercise Management: Patient;Spouse Do you have any problems obtaining your medications?: Yes (Describe) (No insurance) Home Management: shared among all family members Patient/Family Preliminary Plans: Pt plans to return home with his wife and family - all to share in providing him with needed assistance/ supervision Social Work Anticipated Follow Up Needs: HH/OP;Support Group Expected length of stay: 7-10 days  Clinical Impression Very pleasant but highly distractable gentleman here after CVA.  Wife at bedside and very supportive.  Anticipate short LOS.  Pt very focused on getting home ASAP but concerned about financial matters.  Has applied for MA and SSD.  Will provide support and d/c planning  assistance as appropriate.  Chia Rock 05/12/2012, 3:17 PM

## 2012-05-12 NOTE — Progress Notes (Signed)
Speech Language Pathology Daily Session Note  Patient Details  Name: Jon [name] MRN: [id number] Date of Birth: 02/05/1966  Today's Date: 05/12/2012 Time: 0915-1000 Time Calculation (min): 45 min  Short Term Goals: Week 1: SLP Short Term Goal 1 (Week 1): Short term goals = long term goals  Skilled Therapeutic Interventions: Treatment focus on cognitive goals with focus on intellectual awareness of deficits and safety awareness. Pt consumed thin liquids intermittently throughout functional conversation and utilized chin tuck with 50% of trials. Pt with intermittent wet vocal quality that pt cleared independently with utilization of a throat clear. Pt also demonstrated an explosive cough X 1 due to not utilizing chin tuck. Pt required Min verbal cues to decrease impulsivity and to ask for assistance before getting up from his recliner to use the bathroom. Pt also required Mod verbal cues for emergent awareness/insight into his deficits and how they impact his overall safety and function. Pt perseverative on topics throughout the session and required Mod verbal cues for topic maintenance and turn taking during a functional conversation. Pt's wife present throughout session and both pt and wife were educated on strategies to utilize to help decrease pt's frustration with staff/family.    FIM:  Comprehension Comprehension Mode: Auditory Comprehension: 4-Understands basic 75 - 89% of the time/requires cueing 10 - 24% of the time Expression Expression Mode: Verbal Expression: 5-Expresses basic needs/ideas: With extra time/assistive device Social Interaction Social Interaction: 4-Interacts appropriately 75 - 89% of the time - Needs redirection for appropriate language or to initiate interaction. Problem Solving Problem Solving: 4-Solves basic 75 - 89% of the time/requires cueing 10 - 24% of the time Memory Memory: 4-Recognizes or recalls 75 - 89% of the time/requires cueing 10 - 24% of the  time  Pain No/Denies Pain  Therapy/Group: Individual Therapy  Theresia Pree 05/12/2012, 4:24 PM

## 2012-05-12 NOTE — Progress Notes (Signed)
ANTICOAGULATION CONSULT NOTE - Follow Up Consult  Pharmacy Consult for Coumadin Indication: AVR and post-op AFib  Allergies  Allergen Reactions  . Dilaudid (Hydromorphone Hcl) Other (See Comments)    High doses cause itching  . Penicillins Other (See Comments)    Unknown    Patient Measurements: Height: 5\' 11"  (180.3 cm) Weight: 179 lb 7.3 oz (81.4 kg) IBW/kg (Calculated) : 75.3  Heparin Dosing Weight:   Vital Signs: Temp: 97.7 F (36.5 C) (01/06 0430) Temp src: Oral (01/06 0430) BP: 103/71 mmHg (01/06 0430) Pulse Rate: 65  (01/06 0430)  Labs:  Basename 05/12/12 0920 05/12/12 0456 05/11/12 1110 05/11/12 0623 05/10/12 0630 05/09/12 1804  HGB 10.7* 9.7* -- -- -- --  HCT 33.6* 29.8* 32.4* -- -- --  PLT 368 311 371 -- -- --  APTT -- -- -- -- -- --  LABPROT -- 25.7* -- 23.2* 20.4* --  INR -- 2.48* -- 2.16* 1.82* --  HEPARINUNFRC -- -- -- 0.53 0.47 0.29*  CREATININE 0.73 0.81 0.74 -- -- --  CKTOTAL -- -- -- -- -- --  CKMB -- -- -- -- -- --  TROPONINI -- -- -- -- -- --    Estimated Creatinine Clearance: 122.9 ml/min (by C-G formula based on Cr of 0.73).   Medications:  Scheduled:    . amiodarone  200 mg Oral BID  . aspirin  81 mg Oral Daily  . carbamide peroxide  5 drop Right Ear BID  . pantoprazole  40 mg Oral Daily  . [COMPLETED] warfarin  5 mg Oral ONCE-1800  . Warfarin - Pharmacist Dosing Inpatient   Does not apply q1800    Assessment: 47yo male with s/p mechanical AVR with post-op AFib and new cerebellar stroke.  INR is therapeutic this AM at 2.48.  Hg is stable.  No bleeding problems noted.  Goal of Therapy:  INR 2-3 Monitor platelets by anticoagulation protocol: Yes   Plan:  1.  Coumadin 5mg  today 2.  Daily INR  Marisue Humble, PharmD Clinical Pharmacist [facility]

## 2012-05-12 NOTE — Procedures (Signed)
EEG NUMBER:  REFERRING PHYSICIAN:  Dr. Katrinka Blazing.  HISTORY:  A 47 year old male with syncopal episode evaluated to rule out seizures.  MEDICATIONS:  Ultram, Vancocin, Coumadin,  Protonix, Lopressor, Levaquin, Pacerone.  CONDITIONS OF RECORDING:  This is a 16-channel EEG carried out with the patient in the awake, and drowsy states.  DESCRIPTION:  The waking background activity consists of a low-voltage, symmetrical, fairly well-organized 10 Hertz alpha activity seen from the parieto-occipital and posterotemporal regions.  Low-voltage, fast activity, poorly organized is seen anteriorly and at times, superimposed on more posterior rhythms.  A mixture of theta and alpha was seen from the central and temporal regions.  During wakefulness there are intervening episodes of slowing that consist of  polymorphic delta activity that is poorly sustained.  As these become more frequent the patient interest drowse with the background activity slows and beta and delta rhythms are seen that are poorly organized and continuous.  Stage II sleep was not obtained.  Hyperventilation was not performed. Intermittent photic stimulation failed to elicit any change in the tracing.  IMPRESSION:  This is a normal EEG.          ______________________________ Thana Farr, MD    [id number] D:  05/08/2012 08:04:31  T:  05/08/2012 08:43:45  Job #:  [id number]

## 2012-05-13 ENCOUNTER — Inpatient Hospital Stay (HOSPITAL_COMMUNITY): Payer: Medicaid Other | Admitting: *Deleted

## 2012-05-13 ENCOUNTER — Encounter (HOSPITAL_COMMUNITY): Payer: Self-pay | Admitting: Occupational Therapy

## 2012-05-13 ENCOUNTER — Inpatient Hospital Stay (HOSPITAL_COMMUNITY): Payer: Self-pay | Admitting: Physical Therapy

## 2012-05-13 ENCOUNTER — Inpatient Hospital Stay (HOSPITAL_COMMUNITY): Payer: Medicaid Other | Admitting: Speech Pathology

## 2012-05-13 ENCOUNTER — Inpatient Hospital Stay (HOSPITAL_COMMUNITY): Payer: Medicaid Other | Admitting: Physical Therapy

## 2012-05-13 LAB — URINALYSIS, ROUTINE W REFLEX MICROSCOPIC
Bilirubin Urine: NEGATIVE
Glucose, UA: NEGATIVE mg/dL
Hgb urine dipstick: NEGATIVE
Specific Gravity, Urine: 1.022 (ref 1.005–1.030)
Urobilinogen, UA: 1 mg/dL (ref 0.0–1.0)

## 2012-05-13 LAB — PROTIME-INR: INR: 2.13 — ABNORMAL HIGH (ref 0.00–1.49)

## 2012-05-13 MED ORDER — WARFARIN SODIUM 5 MG PO TABS
5.0000 mg | ORAL_TABLET | Freq: Once | ORAL | Status: AC
  Administered 2012-05-13: 5 mg via ORAL
  Filled 2012-05-13: qty 1

## 2012-05-13 NOTE — Progress Notes (Signed)
In good spirits.  Anxious to go home  BP 102/68  Pulse 64  Temp 97.8 F (36.6 C) (Oral)  Resp 22  Ht 5\' 11"  (1.803 m)  Wt 179 lb 7.3 oz (81.4 kg)  BMI 25.03 kg/m2  SpO2 99%  + seroma right thigh, open wound healing, no sign of infection  PT 22.9/ INR 2.13  He tells me he will be discharged in AM.  Will long term coumadin follow up. Dr. Elease Hashimoto saw him while in hospital but he will follow up with [facility] in [place]. Given that it would be best to have them manage his coumadin for the long term.

## 2012-05-13 NOTE — Progress Notes (Signed)
Occupational Therapy Note  Patient Details  Name: Jon [name] MRN: [id number] Date of Birth: [birth year] Today's Date: 05/13/2012  Time: 7829-5621 (cotx with Speech Therapy - group time 1130-1200) Pt denies pain Group Therapy Pt participated in self feeding group with focus on swallowing strategies and attention to task.  Pt required min A for swallowing strategies but exhibited awareness when he started coughing.  Patient requested to get up from table and look out window at completion of meal.  Pt walked over to window with supervision.  Pt ambulated to room with supervision at end of session.  Lavone [facility]eri [facility] 05/13/2012, 3:54 PM

## 2012-05-13 NOTE — Progress Notes (Signed)
ANTICOAGULATION CONSULT NOTE - Follow Up Consult  Pharmacy Consult for coumadin Indication: AVR/post op afib  Allergies  Allergen Reactions  . Dilaudid (Hydromorphone Hcl) Other (See Comments)    High doses cause itching  . Penicillins Other (See Comments)    Unknown    Patient Measurements: Height: 5\' 11"  (180.3 cm) Weight: 179 lb 7.3 oz (81.4 kg) IBW/kg (Calculated) : 75.3  Heparin Dosing Weight:   Vital Signs: Temp: 98.2 F (36.8 C) (01/07 0540) Temp src: Oral (01/07 0540) BP: 104/70 mmHg (01/07 0540) Pulse Rate: 90  (01/07 0840)  Labs:  Basename 05/13/12 0440 05/12/12 0920 05/12/12 0456 05/11/12 1110 05/11/12 0623  HGB -- 10.7* 9.7* -- --  HCT -- 33.6* 29.8* 32.4* --  PLT -- 368 311 371 --  APTT -- -- -- -- --  LABPROT 22.9* -- 25.7* -- 23.2*  INR 2.13* -- 2.48* -- 2.16*  HEPARINUNFRC -- -- -- -- 0.53  CREATININE -- 0.73 0.81 0.74 --  CKTOTAL -- -- -- -- --  CKMB -- -- -- -- --  TROPONINI -- -- -- -- --    Estimated Creatinine Clearance: 122.9 ml/min (by C-G formula based on Cr of 0.73).   Medications:  Scheduled:    . amiodarone  200 mg Oral BID  . aspirin  81 mg Oral Daily  . carbamide peroxide  5 drop Right Ear BID  . pantoprazole  40 mg Oral Daily  . [COMPLETED] warfarin  5 mg Oral ONCE-1800  . warfarin  5 mg Oral ONCE-1800  . Warfarin - Pharmacist Dosing Inpatient   Does not apply q1800   Infusions:    Assessment: 47 yo male with AVR and post op afib is currently on therapeutic coumadin.  INR was 2.13.  Goal of Therapy:  INR 2-3    Plan:  1) Coumadin 5mg  po x1 2) INR good tom, can do coumadin 5mg  po qday and check INR MWF.  Braiden Rodman, Tsz-Yin 05/13/2012,8:54 AM

## 2012-05-13 NOTE — Patient Care Conference (Signed)
Inpatient RehabilitationTeam Conference and Plan of Care Update Date: 05/13/2012   Time: 2:55 PM    Patient Name: Jon [name]      Medical Record Number: [id number]  Date of Birth: 05/10/1965 Sex: Male         Room/Bed: 4151/4151-01 Payor Info: Payor: MEDICAID POTENTIAL  Plan: MEDICAID POTENTIAL  Product Type: *No Product type*     Admitting Diagnosis: MULTI MEDICAL AND CVA  Admit Date/Time:  05/09/2012  5:18 PM Admission Comments: No comment available   Primary Diagnosis:  CVA (cerebral infarction) Principal Problem: CVA (cerebral infarction)  Patient Active Problem List   Diagnosis Date Noted  . CVA (cerebral infarction) 05/11/2012  . Stroke, embolic 05/07/2012    Class: Acute  . HCAP (healthcare-associated pneumonia) 05/06/2012  . Syncope 05/06/2012  . Anxiety 05/06/2012  . Aortic stenosis 05/06/2012  . Aortic aneurysm and dissection 05/06/2012  . Thoracic aortic aneurysm 05/06/2012    Expected Discharge Date: Expected Discharge Date: 05/15/12  Team Members Present: Physician leading conference: Dr. 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          Swallow/Nutrition/ Hydration   regular textures with thin liquids and Min A verbal cues for utilization of swallowing compensatory strategies  supervision  increase utilization of chin tuck with thin liquids   ADL's   supervision  supervision  family education, safety wtih mobility   Mobility   supervision-min A  supervision  family ed     Communication   Min-Mod A  supervision  safety awareness   Safety/Cognition/ Behavioral Observations            Pain   pt complains of mild pain to chest at time. 2 tylenol given PRN q4 hrs.   3 or less  monitor pain and medicate as needed   Skin   mid sternal incision scabed over OTA, hematoma to right thight with serous drainge minimal- dressing-guaze CDI  no new skin breakdown and stay free from infection  assess skin qshift     Rehab Goals Patient on target to meet rehab goals: Yes *See Interdisciplinary Assessment and Plan and progress notes for long and short-term goals  Barriers to Discharge: safety, impulsivity, awareness, visual deficits    Possible Resolutions to Barriers:  improved safety awareness. caregiver training, neuro-optho follow up    Discharge Planning/Teaching Needs:  home with wife and family who are able to provide 24/7 assistance/ supervision.      Team Discussion:  Pt continues to exhibit very poor awareness of his deficits and decreased appreciation of personal health maintenance recommended for longer term.  Team continues to evaluate pt's visual impairments in attempts to make recommendations for compensation.  Overall he is making good functional improvements despite limitations.  Team feel he should attend OP Rehab (PT, OT, ST), however, concern that pt may not agree to do this.  SW to review with pt and wife.  Wife very concerned about pt's behavioral and personality changes, however, despite education about behavioral management, this will likely be a long term issue/ concern.  Feel ready for d/c end of week  Revisions to Treatment Plan:  No changes made to care plan.   Continued Need for Acute Rehabilitation Level of Care: The patient requires daily medical management by a physician with specialized training in physical medicine and rehabilitation for the following conditions: Daily direction of a multidisciplinary physical rehabilitation program to  ensure safe treatment while eliciting the highest outcome that is of practical value to the patient.: Yes Daily medical management of patient stability for increased activity during participation in an intensive rehabilitation regime.: Yes Daily analysis of laboratory values and/or radiology reports with any subsequent need for medication adjustment of medical intervention for : Neurological problems;Cardiac problems  Keilany Burnette 05/13/2012, 4:00 PM

## 2012-05-13 NOTE — Progress Notes (Signed)
Social Work Patient ID: Jon [name], male   DOB: Dec 02, 1965, 47 y.o.   MRN: [id number]  Met with patient and wife this afternoon to review team conference.  Both aware and agreeable with targeted d/c date of 05/15/12 at supervision level goals.  Discussed team recommendation that he attend outpatient therapies and pt very agreeable.  Wife with questions about tips for behavioral management at home as she anticipates pt is "... Going to want to take off and do stuff as soon as we get home."  Explained to both that pt will have recommended restrictions (i.e. No driving) at d/c.  Provided recommendations for "self preservation" for wife such as having scheduled days/ times that other family members are providing supervision to allow her to have a break from the caregiver role.  Wife aware that pt's impulsivity will be a longer term issue.  Overall, however, both are pleased with idea of d/c in two days and with follow up therapies.  Encourage tx team to continue to offer them both techniques in emotionally adjusting to their return to home.  Kabria Hetzer, LCSW

## 2012-05-13 NOTE — Consult Note (Signed)
05/12/12  NEUROCOGNITIVE TESTING - CONFIDENTIAL [facility]   Jon [name] is a 47 year old, right-handed, married Caucasian male, who reported experiencing memory loss, visual changes, and increased impulsivity following a cerebrovascular accident he sustained. According to medical records, a brain MRI scan performed in December 2013 reportedly revealed a new punctate infarction within the left cerebellum. There was also a question as to whether a few other punctate infarctions occurred in the occipital lobes though the findings were only bordering detectable. The findings also raised the possibility of ongoing posterior circulation micro emboli.   Owing to Jon [name]'s visual issues, an ophthalmology consult was ordered. Notes indicated that patient's eyes themselves appeared normal except for some hypertensive retinopathy. However, it was also thought that Jon [name]'s trouble focusing may mean that his stroke caused his visual issues.   Of note, Jon [name] reported that the patient has always been impulsive but this trait has been exacerbated since his stroke. 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 Since his CVA, his vision purportedly "comes and goes."   The patient was referred for neuropsychological consultation given the possibility of cognitive sequelae subsequent to the current medical status and in order to assist in treatment planning.   PROCEDURES: [3 units of 46962 on 05/12/12]  Diagnostic Interview Medical record review Behavioral observations  The following test was performed during today's visit: Repeatable Battery for the Assessment of Neuropsychological Status (RBANS, form A). Test results are as follows:   Of note, one task was discontinued owing to visual limitations.   RBANS Indices Scaled Score Percentile Description  Immediate Memory  97 42 Average  Visuospatial/Constructional 62 1 Markedly  impaired  Language 88 21 Below average  Attention 64 1 Markedly impaired  Delayed Memory 81 10 Below average  Total Score 73 4 Impaired    RBANS Subtests Raw Score Percentile Description  List Learning 25 27 Average  Story Memory 17 50 Average  Figure Copy 4 <1 Markedly impaired  Line Orientation 12 13 Below average  Picture Naming 10 70 Average  Semantic Fluency 15 12 Below average  Digit Span 9 23 Below average  Coding 0 <1 Markedly impaired  List Recall 8 81 Above average  List Recognition 19 12 Below average  Story Recall 7 14 Below average  Figure recall 2 <1 Markedly impaired   Cognitive Evaluation: Test results revealed generally reduced (if not markedly impaired) functioning in most cognitive domains and thinking skills assessed during this evaluation with the exception of intact learning and memory, and simple confrontation naming.  The predominant deficits observed were seemingly due to certain issues with vision.    Emotional & Behavioral Evaluation: Jon [name] was appropriately dressed for season and situation. Normal gait and posture were noted. He was friendly and rapport easily established. His speech was pressured, but he was able to express ideas effectively. He seemed to understand test directions readily. His affect was somewhat flat. Throughout the evaluation he tended to shake his head in an attempt to focus his eyes. He was also quite wide-eyed. Attention and motivation were good. Optimal test taking conditions were maintained.  From an emotional standpoint, Jon [name] endorsed experiencing increased anxiety. Suicidal/homicidal ideation, plan or intent was denied. No manic or hypomanic episodes were reported. The patient denied ever experiencing any auditory/visual hallucinations. While the patient has always reportedly been impulsive, this trait has increased since his stroke.    Overall, test results revealed reduced functioning that seemed  mostly due to  visual scanning deficits that adversely impacted several cognitive skills. Jon [name]'s performance is most consistent with a diagnosis of multi-domain mild cognitive impairment with stroke and subsequent visual manifestations being the most likely etiology. 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   In light of these findings, the following recommendations are provided.    RECOMMENDATIONS Recommendations for treatment team:    When interacting with Jon [name], directions and information should be provided in a simple, straight forward manner, and the treatment team should avoid giving multiple instructions simultaneously.    He may also benefit from being provided with multiple trials to learn new skills. In addition, he will benefit from recognition cueing and an increase in structure and context (i.e., directions given in conversational format as opposed to a random list of instructions).  Verbal format will be much more beneficial than visual.    Since emotional factors are likely adversely impacting the patient's daily life, implementing an antidepressant may be beneficial.    There is conflicting evidence about the efficacy of using an antidepressant post stroke because while they can help with depression, they can also possibly increase fall risk and bleeding, as well as seizure and/or sedation. However, antidepressants can help stabilize the chemical imbalance, increased compliance of cerebrovascular disease preventing regimens, and may have an effect on serotonin-mediated platelet activation; all of which can increase survival rates.    Overall fluoxetine is reportedly a good choice. However, no one particular class is seemingly best.    Research suggests that the best way to view  post-stroke depression is from a bio-psycho-social perspective.      Biological:  disruption of neural circuits & neurochemicals.    Psychological: Poor coping skills.   Social: Disability, limited social support, and loss of independence   To the extent possible, multitasking should be avoided.   Be aware that Jon [name] is suffering from significant visual limitations. Taking this into consideration will help tailor therapy and keep accidents at bay as much as possible when he is trying to navigate his surroundings.    Jon [name] demonstrated a tendency to be impulsive. Members of the treatment team should be aware that they may need to stop the patient from starting something in order to ensure that he has a clear understanding of what he is supposed to do. His impulsive may also lead to poor decision making and may predispose him to falls.    Performance will generally be best in a structured, routine, and familiar environment, as opposed to situations involving complex problems.   Recommendations for discharge planning:    Consultation with a neuro-ophthalmologist (or other appropriate provider) is warranted to assist with differential diagnosis of Jon [name]'s visual issues. 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     Complete a comprehensive neuropsychological evaluation as an outpatient in 8-12 months.    Establish long-term follow-up care with a provider knowledgeable in stroke with subsequent visual manifestations.      Maintain engagement in mentally, physically and cognitively stimulating activities.    Strive to maintain a healthy lifestyle (e.g., proper diet and exercise) in order to promote physical, cognitive and emotional health.    Due to the nature and severity of the symptoms noted during this evaluation, it is recommended that he obtain constant  care and supervision following this hospitalization.    Jon [name]'s cognitive / psychiatric symptoms place  him at a significant vocational disadvantage. As such, obtaining disability benefits is warranted.     Establishing a power of attorney is warranted.    The patient should refrain from driving.     REFERRING DIAGNOSIS: Cerebrovascular accident  FINAL DIAGNOSES:  Cerebrovascular accident Anxiety disorder, NOS    Debbe Mounts, Psy.D.  Clinical Neuropsychologist

## 2012-05-13 NOTE — Progress Notes (Signed)
Subjective/Complaints: Still some ringing and echoing in right ear. Urinary frequency last night    A 12 point review of systems has been performed and if not noted above is otherwise negative.   Objective: Vital Signs: Blood pressure 104/70, pulse 90, temperature 98.2 F (36.8 C), temperature source Oral, resp. rate 18, height 5\' 11"  (1.803 m), weight 81.4 kg (179 lb 7.3 oz), SpO2 98.00%. No results found.  Basename 05/12/12 0920 05/12/12 0456  WBC 9.4 7.4  HGB 10.7* 9.7*  HCT 33.6* 29.8*  PLT 368 311    Basename 05/12/12 0920 05/12/12 0456  NA 137 139  K 4.4 4.3  CL 101 102  GLUCOSE 108* 92  BUN 9 9  CREATININE 0.73 0.81  CALCIUM 8.5 8.3*   CBG (last 3)  No results found for this basename: GLUCAP:3 in the last 72 hours  Wt Readings from Last 3 Encounters:  05/12/12 81.4 kg (179 lb 7.3 oz)  05/09/12 84.414 kg (186 lb 1.6 oz)  05/03/12 83.3 kg (183 lb 10.3 oz)    Physical Exam:  HENT: right ear externally intact Head: Normocephalic and atraumatic.  Eyes: Conjunctivae normal are normal. Pupils are equal, round, and reactive to light.  Pupils reactive to light without nystagmus  Neck: Neck supple. No thyromegaly present.  Cardiovascular: Normal rate and regular rhythm.  Cardiac rate control  Pulmonary/Chest: Effort normal and breath sounds normal. He has no wheezes.  Abdominal: Soft. Bowel sounds are normal. He exhibits no distension. There is no tenderness.  Incision clean and dressed  Neurological: He is alert and oriented to person, place, and time.  Speech is mildly dysarthric but intelligible. He does follow simple commands. Noted decreased fine motor skills in RUE. Mild weakness right arm. Gaze dysconjugate. He is still unable to gaze superiorly and appears to have some limitations in the inferior fields. Lateral gaze grossly intact. Limited depth perception. No focal visual field cuts. Moves a little impulsively. Balance improving. No sensory deficits.  Skin:  Skin is warm and dry. Hearing a little less through the right ear. Psychiatric: He has a normal mood and affect. In good spirits. His behavior is normal   Assessment/Plan: 1. Functional deficits secondary to embolic occipital and cerebellar infarcts which require 3+ hours per day of interdisciplinary therapy in a comprehensive inpatient rehab setting. Physiatrist is providing close team supervision and 24 hour management of active medical problems listed below. Physiatrist and rehab team continue to assess barriers to discharge/monitor patient progress toward functional and medical goals. FIM: FIM - Bathing Bathing Steps Patient Completed:  (refused bath) Bathing: 4: Steadying assist  FIM - Upper Body Dressing/Undressing Upper body dressing/undressing: 0: Wears gown/pajamas-no public clothing FIM - Lower Body Dressing/Undressing Lower body dressing/undressing steps patient completed: Don/Doff left sock;Don/Doff right sock;Thread/unthread right pants leg;Thread/unthread left pants leg;Pull pants up/down Lower body dressing/undressing: 4: Steadying Assist  FIM - Toileting Toileting steps completed by patient: Adjust clothing prior to toileting;Performs perineal hygiene;Adjust clothing after toileting Toileting Assistive Devices: Grab bar or rail for support Toileting: 5: Supervision: Safety issues/verbal cues  FIM - Archivist Transfers: 5-To toilet/BSC: Supervision (verbal cues/safety issues);5-From toilet/BSC: Supervision (verbal cues/safety issues)  FIM - Press photographer Assistive Devices: Arm rests Bed/Chair Transfer: 5: Bed > Chair or W/C: Supervision (verbal cues/safety issues);5: Chair or W/C > Bed: Supervision (verbal cues/safety issues)  FIM - Locomotion: Wheelchair Distance: 0 Locomotion: Wheelchair: 0: Activity did not occur FIM - Locomotion: Ambulation Ambulation/Gait Assistance: 4: Min guard Locomotion: Ambulation: 4:  Travels 150 ft or  more with minimal assistance (Pt.>75%)  Comprehension Comprehension Mode: Auditory Comprehension: 5-Follows basic conversation/direction: With extra time/assistive device  Expression Expression Mode: Verbal Expression: 5-Expresses basic needs/ideas: With extra time/assistive device  Social Interaction Social Interaction: 4-Interacts appropriately 75 - 89% of the time - Needs redirection for appropriate language or to initiate interaction.  Problem Solving Problem Solving: 4-Solves basic 75 - 89% of the time/requires cueing 10 - 24% of the time  Memory Memory: 4-Recognizes or recalls 75 - 89% of the time/requires cueing 10 - 24% of the time  Medical Problem List and Plan:  1. embolic cerebellar and occipital lobe infarcts  2. DVT Prophylaxis/Anticoagulation: Chronic Coumadin therapy. INR today of 1.66. Continue intravenous heparin to IR greater than 2.00 Monitor for a bleeding episodes  3. Pain Management: Percocet one tablet every 4 hours as needed severe pain. Monitor the increased mobility  4. Neuropsych: This patient is capable of making decisions on his/her own behalf.  5. 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 Followup per cardiology services  6. Hypertension. Amiodarone 200 mg twice a day.  7. Right pleural effusion. Status post thoracentesis 05/08/2011. Followup chest x-ray with no pneumothorax. Off O2 8. Right ear "buzzing"--frequent hunter, shooter PTA (didn'[name] wear ear muffs. i supsect this is main problem  -ceruminex for ear wax 9. Urinary frequency- check urine analysis and culture LOS (Days) 4 A FACE TO FACE EVALUATION WAS PERFORMED  Jon [name] 05/13/2012 9:28 AM

## 2012-05-13 NOTE — Progress Notes (Signed)
Occupational Therapy Session Note  Patient Details  Name: Jon [name] MRN: [id number] Date of Birth: [birth year]  Today's Date: 05/13/2012 Time: 6213-0865 Time Calculation (min): 60 min  Short Term Goals: Week 1:  OT Short Term Goal 1 (Week 1): STGs = LTGs due to short LOS  Skilled Therapeutic Interventions/Progress Updates:    1:1 self care retraining at shower level down in tub room with focus on functional ambulation around unit with close supervision, stepping over tub, standing balance, engaged in the BERG balance test scoring a 46/56 requiring occasional steadying a with turning due to narrow base of support and decreased vision; focus on dynamic balance.  Pt able to read and see "clearly" but when stood vision unclear and with more difficulty seeing BP taken with positional changed 97/64 sitting and then standing 101/70 and then with increased time 91/70.   Therapy Documentation Precautions:  Precautions Precautions: Fall Precaution Comments: severe vision deficits, impulsive Restrictions Weight Bearing Restrictions: No Pain: Pain Assessment Pain Score:  ("don't hurt as bad".)  See FIM for current functional status  Therapy/Group: Individual Therapy  Roney Mans [facility] 05/13/2012, 12:15 PM

## 2012-05-13 NOTE — Progress Notes (Signed)
Physical Therapy Session Note  Patient Details  Name: Jon [name] MRN: [id number] Date of Birth: 05-14-1965  Today's Date: 05/13/2012 Time: 3086-5784 Time Calculation (min): 24 min   Skilled Therapeutic Interventions/Progress Updates:    Today's session focused on dynamic balance and high level gait training with visual compensatory strategies. Upon arrival in room, patient with requests to use bathroom. Patient ambulates to bathroom with supervision. See gait/ambulation details of session below. Patient performed 2 sets x20-25" of double leg stance on BOSU ball with supervision and one instance of min assist to maintain balance. Patient refused to participate until the end of session secondary to having visitors waiting for him.  Therapy Documentation Precautions:  Precautions Precautions: Fall Precaution Comments: severe vision deficits, impulsive  Restrictions Weight Bearing Restrictions: No Pain: Pain Assessment Pain Assessment: No/denies pain Pain Score: 0-No pain Locomotion : Ambulation Ambulation: Yes Ambulation/Gait Assistance: 5: Supervision Ambulation Distance (Feet): 300 Feet Assistive device: None Ambulation/Gait Assistance Details: Verbal cues for precautions/safety Ambulation/Gait Assistance Details: Patient able to negotiate obstacles in hallways and did not experience any overt LOB. High Level Ambulation High Level Ambulation: Head turns;Toe walking;Heel walking;Other high level ambulation Head Turns: 50'x2 horizontal and vertical head turns with supervision. Patient able to maintain gait speed and did not experience any overt LOB. Toe Walking: 51' x2 with supervision, no LOB. Heel Walking: 66' x2 with supervision, no LOB. High Level Ambulation - Other Comments: Tandem walking x10', requires min assist for balance. Activity discontinued secondary to patient refusal. Stairs / Additional Locomotion Stairs: Yes Stairs Assistance: 5: Supervision Stairs  Assistance Details: Verbal cues for precautions/safety Stair Management Technique: No rails;Forwards;Alternating pattern Number of Stairs: 12  Height of Stairs: 6  Wheelchair Mobility Distance: 0   See FIM for current functional status  Therapy/Group: Individual Therapy  Chipper Herb. Angelice Piech, PT, DPT  05/13/2012, 4:16 PM

## 2012-05-13 NOTE — Progress Notes (Signed)
Speech Language Pathology Daily Session Notes  Patient Details  Name: Jon [name] MRN: [id number] Date of Birth: 10/16/1965  Today's Date: 05/13/2012  Session 1 Time: 4098-1191 Time Calculation: 45 min  Session 2 Time: 1130-1145 Time Calculation (min): 15 min  Short Term Goals: Week 1: SLP Short Term Goal 1 (Week 1): Short term goals = long term goals  Skilled Therapeutic Interventions:  Session 1: Treatment focus on cognitive goals with focus on emergent awareness of deficits and safety awareness. Pt consumed thin liquids intermittently throughout functional conversation and utilized chin tuck with 75% of trials. Pt with intermittent wet vocal quality that pt cleared independently with utilization of a throat clear. Pt required Min verbal cues to decrease impulsivity and to ask for assistance before performing functional tasks. Pt also required Mod verbal cues for emergent awareness/insight into his deficits and how they impact his overall safety and function for discharge home. Pt perseverative on topics throughout the session and required Mod verbal cues for topic maintenance with redirection and turn taking during a functional conversation. Pt's wife present throughout session and both pt and wife were educated on current cognitive function and strategies to utilize at home to increase safety.    Session 2: Pt participated in group treatment with focus on dysphagia goals. Pt consumed regular textures and thin liquids and utilized swallowing compensatory strategies of a slow pace, small bites and chin tuck with Min verbal cues. Pt demonstrated explosive cough X 1 due to not utilizing chin tuck with liquids. Pt demonstrated appropriate conversation with other group members but required Min verbal cues to decrease verbosity and to attend to meal.   FIM:  Comprehension Comprehension Mode: Auditory Comprehension: 5-Follows basic conversation/direction: With extra time/assistive  device Expression Expression: 4-Expresses basic 75 - 89% of the time/requires cueing 10 - 24% of the time. Needs helper to occlude trach/needs to repeat words. Problem Solving Problem Solving: 4-Solves basic 75 - 89% of the time/requires cueing 10 - 24% of the time Memory Memory: 4-Recognizes or recalls 75 - 89% of the time/requires cueing 10 - 24% of the time FIM - Eating Eating Activity: 5: Needs verbal cues/supervision  Pain Pain Assessment Pain Assessment: No/denies pain Pain Score: 0-No pain  Therapy/Group: Individual Therapy and Dysphagia Group  Amillia Biffle 05/13/2012, 4:43 PM

## 2012-05-14 ENCOUNTER — Inpatient Hospital Stay (HOSPITAL_COMMUNITY): Payer: Medicaid Other | Admitting: Physical Therapy

## 2012-05-14 ENCOUNTER — Encounter (HOSPITAL_COMMUNITY): Payer: Self-pay | Admitting: Occupational Therapy

## 2012-05-14 ENCOUNTER — Inpatient Hospital Stay (HOSPITAL_COMMUNITY): Payer: Medicaid Other | Admitting: Speech Pathology

## 2012-05-14 DIAGNOSIS — I6340 Cerebral infarction due to embolism of unspecified cerebral artery: Secondary | ICD-10-CM

## 2012-05-14 DIAGNOSIS — I10 Essential (primary) hypertension: Secondary | ICD-10-CM

## 2012-05-14 LAB — URINE CULTURE
Colony Count: NO GROWTH
Culture: NO GROWTH

## 2012-05-14 LAB — PROTIME-INR
INR: 1.99 — ABNORMAL HIGH (ref 0.00–1.49)
Prothrombin Time: 21.8 seconds — ABNORMAL HIGH (ref 11.6–15.2)

## 2012-05-14 MED ORDER — WARFARIN SODIUM 7.5 MG PO TABS
7.5000 mg | ORAL_TABLET | Freq: Once | ORAL | Status: AC
  Administered 2012-05-14: 7.5 mg via ORAL
  Filled 2012-05-14: qty 1

## 2012-05-14 NOTE — Progress Notes (Signed)
Occupational Therapy daily note and Discharge Summary  Patient Details  Name: Jon [name] MRN: [id number] Date of Birth: [birth year]  Today's Date: 05/14/2012 Time: 1191-4782 Time Calculation (min): 45 min  1:1 GRAD DAY! Self care retraining at shower level down in tub room with attention to safety with all functional mobility, pacing self to compensate for visual disturbances, activity tolerance/ endurance training. Pt able to step over the threshold to the tub with supervision and used tub seat to sit to perform bathing and drying. Pt returned to the room and able to change the dirty linens on the bed to clean ones after obtaining them from linen cart in hallway. Pt demonstrated safe dynamic balance only requiring supervision due to impulsive movement and significant visual disturbances. Reviewed therapeutic activities pt and wife could participate in together at home to further improve his performance with IADLs and safety tips for home.   Patient has met 7 of 7 long term goals due to improved activity tolerance, improved balance, postural control, ability to compensate for  Visual deficits and improved coordination.  Patient to discharge at overall Supervision level.  Patient's care partner is independent to provide the necessary physical and cognitive assistance at discharge.    Reasons goals not met: n/a  Recommendation:  Patient will benefit from ongoing skilled OT services in outpatient setting to continue to advance functional skills in the area of BADL, iADL and Vocation.  Equipment: No equipment provided  Reasons for discharge: treatment goals met and discharge from hospital  Patient/family agrees with progress made and goals achieved: Yes  OT Discharge Precautions/Restrictions  Precautions Precautions: Fall Precaution Comments: significant visual deficits Restrictions Weight Bearing Restrictions: No Pain Pain Assessment Pain Score: 0-No pain ADL  see  FIM Vision/Perception  Vision - History Baseline Vision: No visual deficits Patient Visual Report: Blurring of vision;Eye fatigue/eye pain/headache;Unable to keep objects in focus Vision - Assessment Eye Alignment: Impaired (comment) Ocular Range of Motion: Restricted looking up Alignment/Gaze Preference:  (Gaze down) Tracking/Visual Pursuits: Decreased smoothness of horizontal tracking;Decreased smoothness of vertical tracking Saccades: Decreased speed of saccadic movement;Additional head turns occurred during testing Perception Perception: Within Functional Limits Praxis Praxis: Intact  Cognition  oriented x4, impulsive, selective attention with supervision - see FIM Sensation Sensation Light Touch: Appears Intact Stereognosis: Appears Intact Hot/Cold: Appears Intact Proprioception: Appears Intact Coordination Gross Motor Movements are Fluid and Coordinated: Yes Fine Motor Movements are Fluid and Coordinated: Yes Motor  Motor Motor - Discharge Observations: improved overall weakness Mobility  Bed Mobility Supine to Sit: 7: Independent Transfers Sit to Stand: 6: Modified independent (Device/Increase time) Stand to Sit: 6: Modified independent (Device/Increase time)  Trunk/Postural Assessment  Cervical Assessment Cervical Assessment: Within Functional Limits Thoracic Assessment Thoracic Assessment: Within Functional Limits Lumbar Assessment Lumbar Assessment: Within Functional Limits Postural Control Postural Control: Within Functional Limits  Balance  see FIM (close supervision with dynamic mobility) Extremity/Trunk Assessment RUE Assessment RUE Assessment: Within Functional Limits LUE Assessment LUE Assessment: Within Functional Limits  See FIM for current functional status  Roney Mans [facility] 05/14/2012, 1:20 PM

## 2012-05-14 NOTE — Progress Notes (Signed)
Physical Therapy Note  Patient Details  Name: Jon [name] MRN: [id number] Date of Birth: June 27, 1965 Today's Date: 05/14/2012  Time: 1030-1128 58 minutes  No c/o pain.  Pt c/o worse vision and dizziness today, rests as needed.   Pt and wife performed gait training in controlled and household environments with supervision, car transfers, floor transfers and flight of stairs all at supervision level.  Dynamic gait training with head turns, heel/toe walking, tandem gait, side and backward walking all with supervision.  Balance training with ball toss, ball kick, squatting, single leg stance activities.  Pt able to control LOB without assistance.  Pt limited by increased dizziness today requiring more frequent rest breaks.  Pt/wife educated on ideas for activity tolerance and safety at home, wife expresses understanding.  Pt performed functional kitchen tasks setting table, moving table and chairs to set up for diner[employer] club all with supervision.  Pt/wife express they are ready for d/c home tomorrow.  Individual therapy  Isa Kohlenberg 05/14/2012, 11:28 AM

## 2012-05-14 NOTE — Progress Notes (Signed)
Physical Therapy Discharge Summary  Patient Details  Name: Jon [name] MRN: [id number] Date of Birth: Aug 05, 1965  Today's Date: 05/14/2012 Time: 1130-1150 Time Calculation (min): 20 min  Patient has met 8 of 9 long term goals due to improved activity tolerance, improved balance, improved postural control, improved attention, improved awareness and improved coordination.  Patient to discharge at an ambulatory level Supervision.   Patient's care partner is independent to provide the necessary supervision assistance at discharge.  Pt limited by impaired awareness and impulsivity.  Pt's wife educated on techniques of how to assist pt with cognitive deficits.  Pt has improved balance and gait and has safely performed education on fall recovery with wife.  Reasons goals not met: community goal not attempted   Equipment: No equipment provided  Reasons for discharge: treatment goals met and discharge from hospital  Patient/family agrees with progress made and goals achieved: Yes  PT Discharge  Cognition Overall Cognitive Status: Impaired Arousal/Alertness: Awake/alert Orientation Level: Oriented X4 Attention: Selective Sustained Attention: Appears intact Selective Attention: Impaired Selective Attention Impairment: Verbal basic;Functional basic Awareness: Impaired Awareness Impairment: Anticipatory impairment Behaviors: Impulsive Safety/Judgment: Impaired Sensation Sensation Light Touch: Appears Intact Proprioception: Appears Intact Coordination Gross Motor Movements are Fluid and Coordinated: Yes Motor  Motor Motor: Within Functional Limits   Trunk/Postural Assessment  Cervical Assessment Cervical Assessment: Within Functional Limits Thoracic Assessment Thoracic Assessment: Within Functional Limits Lumbar Assessment Lumbar Assessment: Within Functional Limits Postural Control Postural Control: Within Functional Limits  Balance Static Standing Balance Static  Standing - Level of Assistance: 5: Stand by assistance Dynamic Standing Balance Dynamic Standing - Level of Assistance: 5: Stand by assistance Extremity Assessment      RLE Assessment RLE Assessment: Within Functional Limits LLE Assessment LLE Assessment: Within Functional Limits  See FIM for current functional status  DONAWERTH,KAREN 05/14/2012, 5:42 PM

## 2012-05-14 NOTE — Progress Notes (Signed)
Speech Language Pathology Session Notes & Discharge Summary  Patient Details  Name: Jon [name] MRN: [id number] Date of Birth: [birth year]  Today's Date: 05/14/2012  Session 1 Time: 3086-5784 Time Calculation (min): 30 min  Session 2 Time: 1130-1150 Time Calculation: 20 min  Skilled Therapeutic Intervention:  Session 1:Treatment focus on pt/family education in regards to current cognitive and swallowing function and strategies to utilize at home to increase physical and cognitive activity, working memory, problem solving, awareness, safety and judgment. Pt and his wife also educated on swallowing compensatory strategies to increase overall safety with current diet of regular textures and thin liquids. Pt and wife verbalized and demonstrated understanding of all information presented.   Session 2: Pt participated in group session with focus on dysphagia goals. Pt consumed regular textures with thin liquids with supervision verbal cues to utilize swallowing compensatory strategies. Pt demonstrated cough X 1 due to not utilizing chin tuck with thin liquids. Wife present and demonstrated appropriate cueing for strategies.   Patient has met 5 of 5 long term goals.  Patient to discharge at overall Supervision level.   Reasons goals not met: N/A   Clinical Impression/Discharge Summary: Pt has made functional gains in the areas of safety awareness, attention and problem solving and has met 5 of 5 LTG's this admission.  Currently, pt is overall supervision-Min A for functional problem solving, anticipatory awareness, safety awareness with insight into deficits, working memory and selective attention. Pt is also consuming regular textures and thin liquids and requires supervision verbal and question cues to utilize swallowing compensatory strategies of small bites, slow pace and chin tuck with liquids. Pt/family education complete and pt will discharge home with wife with 24 hour supervision.  Recommend f/u outpatient skilled SLP intervention to maximize cognitive recovery and swallowing function.   Care Partner:  Caregiver Able to Provide Assistance: Yes  Type of Caregiver Assistance: Physical;Cognitive  Recommendation:  24 hour supervision/assistance;Outpatient SLP  Rationale for SLP Follow Up: Maximize cognitive function and independence;Maximize swallowing safety;Reduce caregiver burden   Equipment: N/A   Reasons for discharge: Treatment goals met;Discharged from hospital   Patient/Family Agrees with Progress Made and Goals Achieved: Yes   See FIM for current functional status  [name] 05/14/2012, 5:02 PM

## 2012-05-14 NOTE — Progress Notes (Signed)
ANTICOAGULATION CONSULT NOTE - Follow Up Consult  Pharmacy Consult for coumadin Indication: AVR/post op afib  Allergies  Allergen Reactions  . Dilaudid (Hydromorphone Hcl) Other (See Comments)    High doses cause itching  . Penicillins Other (See Comments)    Unknown    Patient Measurements: Height: 5\' 11"  (180.3 cm) Weight: 179 lb 7.3 oz (81.4 kg) IBW/kg (Calculated) : 75.3  Heparin Dosing Weight:   Vital Signs: Temp: 98.6 F (37 C) (01/08 0500) Temp src: Oral (01/08 0500) BP: 98/66 mmHg (01/08 0500) Pulse Rate: 70  (01/08 0500)  Labs:  Basename 05/14/12 0640 05/13/12 0440 05/12/12 0920 05/12/12 0456  HGB -- -- 10.7* 9.7*  HCT -- -- 33.6* 29.8*  PLT -- -- 368 311  APTT -- -- -- --  LABPROT 21.8* 22.9* -- 25.7*  INR 1.99* 2.13* -- 2.48*  HEPARINUNFRC -- -- -- --  CREATININE -- -- 0.73 0.81  CKTOTAL -- -- -- --  CKMB -- -- -- --  TROPONINI -- -- -- --    Estimated Creatinine Clearance: 122.9 ml/min (by C-G formula based on Cr of 0.73).   Medications:  Scheduled:     . amiodarone  200 mg Oral BID  . aspirin  81 mg Oral Daily  . carbamide peroxide  5 drop Right Ear BID  . pantoprazole  40 mg Oral Daily  . [COMPLETED] warfarin  5 mg Oral ONCE-1800  . Warfarin - Pharmacist Dosing Inpatient   Does not apply q1800   Infusions:    Assessment: 47 yo male with AVR and post op afib is currently on coumadin.  INR slightly below goal = 1.99. No new cbc, no bleeding noted per chart.  Goal of Therapy:  INR 2-3    Plan:  1) Coumadin 7.5mg  po x1 2) f/u AM INR  Bayard Hugger, PharmD, BCPS  Clinical Pharmacist  Pager: [phone]  05/14/2012,1:38 PM

## 2012-05-14 NOTE — Progress Notes (Signed)
Physical Therapy Session Note  Patient Details  Name: Jon [name] MRN: [id number] Date of Birth: 18-Sep-1965  Today's Date: 05/14/2012 Time: 4098-1191 Time Calculation (min): 28 min  Short Term Goals: Week 1:    STGs=LTGs  Skilled Therapeutic Interventions/Progress Updates:    Limited session due to pt reporting he just doesn't feel as good.  His caregiver reports that he has been more unsteady on his feet this afternoon and that he did not sleep at all last night.  Gait around unit flat surfaces supervision for safety, pt often cutting corners, running into obstacles and walking to fast to be safe.  Encouraged him to slow down and scan his environment so that he will not fall.  Balance poses with supervision once set (needed min assist to attain some positions) semi tandem, tandem, SLS bil multiple trials.  Significant differences right to left with left having greater difficulty.   Gait up and down stairs in stairwell reciprocal pattern 6" steps with right railing supervision for safety due to increased speed, DOE with exertion (VSS HR 96 O2 98%, DOE 3/4 requiring a rest break after 18 stairs (seated on the floor in the stairwell-without warning).  Cues to slow down speed for safety.   Therapy Documentation Precautions:  Precautions Precautions: Fall Precaution Comments: significant visual deficits, very impulsive Restrictions Weight Bearing Restrictions: No    Pain: Pain Assessment Pain Assessment: 0-10 Pain Score:   8 Pain Type: Acute pain Pain Location: Leg Pain Orientation: Right;Left Pain Descriptors: Cramping Pain Intervention(s): RN made aware;Repositioned;Ambulation/increased activity Mobility: Bed Mobility Supine to Sit: 7: Independent Transfers Sit to Stand: 6: Modified independent (Device/Increase time) Stand to Sit: 6: Modified independent (Device/Increase time) Locomotion : Ambulation Ambulation/Gait Assistance: 5: Supervision  Trunk/Postural Assessment  : Cervical Assessment Cervical Assessment: Within Functional Limits Thoracic Assessment Thoracic Assessment: Within Functional Limits Lumbar Assessment Lumbar Assessment: Within Functional Limits Postural Control Postural Control: Within Functional Limits   See FIM for current functional status  Therapy/Group: Individual Therapy  Lurena Joiner B. Eldene Plocher, PT, DPT [phone]   05/14/2012, 3:56 PM

## 2012-05-14 NOTE — Discharge Summary (Signed)
  Discharge summary job # [id number]

## 2012-05-14 NOTE — Progress Notes (Signed)
Subjective/Complaints: Encouraged about progress and dc date. No new complaints today.    A 12 point review of systems has been performed and if not noted above is otherwise negative.   Objective: Vital Signs: Blood pressure 98/66, pulse 70, temperature 98.6 F (37 C), temperature source Oral, resp. rate 17, height 5\' 11"  (1.803 m), weight 81.4 kg (179 lb 7.3 oz), SpO2 97.00%. No results found.  Basename 05/12/12 0920 05/12/12 0456  WBC 9.4 7.4  HGB 10.7* 9.7*  HCT 33.6* 29.8*  PLT 368 311    Basename 05/12/12 0920 05/12/12 0456  NA 137 139  K 4.4 4.3  CL 101 102  GLUCOSE 108* 92  BUN 9 9  CREATININE 0.73 0.81  CALCIUM 8.5 8.3*   CBG (last 3)  No results found for this basename: GLUCAP:3 in the last 72 hours  Wt Readings from Last 3 Encounters:  05/12/12 81.4 kg (179 lb 7.3 oz)  05/09/12 84.414 kg (186 lb 1.6 oz)  05/03/12 83.3 kg (183 lb 10.3 oz)    Physical Exam:  HENT: right ear externally intact Head: Normocephalic and atraumatic.  Eyes: Conjunctivae normal are normal. Pupils are equal, round, and reactive to light.  Pupils reactive to light without nystagmus  Neck: Neck supple. No thyromegaly present.  Cardiovascular: Normal rate and regular rhythm.  Cardiac rate control  Pulmonary/Chest: Effort normal and breath sounds normal. He has no wheezes.  Abdominal: Soft. Bowel sounds are normal. He exhibits no distension. There is no tenderness.  Incision clean and dressed  Neurological: He is alert and oriented to person, place, and time.  Speech is mildly dysarthric but intelligible. He does follow simple commands. Noted decreased fine motor skills in RUE. Mild weakness right arm. Gaze dysconjugate. Upward gaze showing improvement. Lateral gaze grossly intact. Limited depth perception. No focal visual field cuts. Moves a little impulsively. Balance improving. No sensory deficits.  Skin: Skin is warm and dry. Hearing a little less through the right ear. Psychiatric:  He has a normal mood and affect. In good spirits. His behavior is normal   Assessment/Plan: 1. Functional deficits secondary to embolic occipital and cerebellar infarcts which require 3+ hours per day of interdisciplinary therapy in a comprehensive inpatient rehab setting. Physiatrist is providing close team supervision and 24 hour management of active medical problems listed below. Physiatrist and rehab team continue to assess barriers to discharge/monitor patient progress toward functional and medical goals.  Stroke education daily for pt and wife.  FIM: FIM - Bathing Bathing Steps Patient Completed: Chest;Right Arm;Left Arm;Abdomen;Front perineal area;Buttocks;Right upper leg;Left upper leg;Right lower leg (including foot);Left lower leg (including foot) Bathing: 5: Supervision: Safety issues/verbal cues  FIM - Upper Body Dressing/Undressing Upper body dressing/undressing steps patient completed: Thread/unthread right sleeve of pullover shirt/dresss;Thread/unthread left sleeve of pullover shirt/dress;Put head through opening of pull over shirt/dress;Pull shirt over trunk Upper body dressing/undressing: 7: Complete Independence: No helper FIM - Lower Body Dressing/Undressing Lower body dressing/undressing steps patient completed: Thread/unthread right underwear leg;Thread/unthread left underwear leg;Pull underwear up/down;Thread/unthread right pants leg;Thread/unthread left pants leg;Pull pants up/down;Fasten/unfasten pants;Don/Doff right sock;Don/Doff left sock;Don/Doff right shoe;Don/Doff left shoe;Fasten/unfasten right shoe;Fasten/unfasten left shoe Lower body dressing/undressing: 7: Complete Independence: No helper  FIM - Toileting Toileting steps completed by patient: Adjust clothing prior to toileting;Adjust clothing after toileting;Performs perineal hygiene Toileting Assistive Devices: Grab bar or rail for support Toileting: 5: Supervision: Safety issues/verbal cues  FIM - Transport planner Transfers: 5-To toilet/BSC: Supervision (verbal cues/safety issues)  FIM - Games developer  Transfer Assistive Devices: Arm rests Bed/Chair Transfer: 5: Bed > Chair or W/C: Supervision (verbal cues/safety issues);5: Chair or W/C > Bed: Supervision (verbal cues/safety issues)  FIM - Locomotion: Wheelchair Distance: 0 Locomotion: Wheelchair: 0: Activity did not occur FIM - Locomotion: Ambulation Ambulation/Gait Assistance: 5: Supervision Locomotion: Ambulation: 5: Travels 150 ft or more with supervision/safety issues  Comprehension Comprehension Mode: Auditory Comprehension: 5-Follows basic conversation/direction: With extra time/assistive device  Expression Expression Mode: Verbal Expression: 4-Expresses basic 75 - 89% of the time/requires cueing 10 - 24% of the time. Needs helper to occlude trach/needs to repeat words.  Social Interaction Social Interaction: 4-Interacts appropriately 75 - 89% of the time - Needs redirection for appropriate language or to initiate interaction.  Problem Solving Problem Solving: 4-Solves basic 75 - 89% of the time/requires cueing 10 - 24% of the time  Memory Memory: 4-Recognizes or recalls 75 - 89% of the time/requires cueing 10 - 24% of the time  Medical Problem List and Plan:  1. embolic cerebellar and occipital lobe infarcts  2. DVT Prophylaxis/Anticoagulation: Chronic Coumadin therapy. INR today of 1.66. Continue intravenous heparin to IR greater than 2.00 Monitor for a bleeding episodes  3. Pain Management: Percocet one tablet every 4 hours as needed severe pain. Monitor the increased mobility  4. Neuropsych: This patient is capable of making decisions on his/her own behalf.  5. 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 Followup per cardiology services  6. Hypertension. Amiodarone 200 mg twice a day.  7. Right pleural effusion. Status post thoracentesis 05/08/2011. Followup chest x-ray  with no pneumothorax. Off O2 8. Right ear "buzzing"--frequent hunter, shooter PTA (didn't wear ear muffs. i supsect this is main problem  -ceruminex for ear wax 9. Urinary frequency- check urine analysis and culture LOS (Days) 5 A FACE TO FACE EVALUATION WAS PERFORMED  Jalisia Puchalski T 05/14/2012 10:46 AM

## 2012-05-14 NOTE — Plan of Care (Addendum)
Problem: Not Ready for Diet/Lifestyle Change (NB-1.3) Goal: Nutrition education Formal process to instruct or train a patient/client in a skill or to impart knowledge to help patients/clients voluntarily manage or modify food choices and eating behavior to maintain or improve health.  Outcome: Completed/Met Date Met:  05/14/12  Nutrition Education Note  Per wife's request, RD consulted for nutrition education regarding a Heart Healthy diet.   Lipid Panel     Component Value Date/Time    CHOL 114 05/08/2012 0420    TRIG 54 05/08/2012 0420    HDL 34* 05/08/2012 0420    CHOLHDL 3.4 05/08/2012 0420    VLDL 11 05/08/2012 0420    LDLCALC 69 05/08/2012 0420    RD provided "Heart Healthy Nutrition Therapy" handout from the Academy of [employer] Dietetics.  As well as shopping and cooking tips.  Reviewed patient's dietary recall. Provided examples on ways to decrease sodium and fat intake in diet. Discouraged intake of processed foods and use of salt shaker. Encouraged fresh fruits and vegetables as well as whole grain sources of carbohydrates to maximize fiber intake. Teach back method used.  Wife thankful for information but patient is very resistant and states he will continue to do what he wants often.  States he will not smoke but no promises.  Expect poor compliance.  Body mass index is 22.66 kg/(m^2). Pt weight is WNL based on current BMI.  Current diet order is Heart healthy, patient is consuming approximately 100% of meals at this time. Labs and medications reviewed. No further nutrition interventions warranted at this time. RD contact information provided. If additional nutrition issues arise, please re-consult RD.  Oran Rein, RD, LDN Clinical Inpatient Dietitian Pager:  [phone] Weekend and after hours pager:  [phone]

## 2012-05-15 MED ORDER — WARFARIN SODIUM 5 MG PO TABS: 5.0000 mg | ORAL_TABLET | Freq: Every day | ORAL | Status: DC

## 2012-05-15 MED ORDER — OXYCODONE-ACETAMINOPHEN 5-325 MG PO TABS: 1.0000 | ORAL_TABLET | ORAL | Status: DC | PRN

## 2012-05-15 MED ORDER — AMIODARONE HCL 400 MG PO TABS: 200.0000 mg | ORAL_TABLET | Freq: Two times a day (BID) | ORAL | Status: DC

## 2012-05-15 MED ORDER — PANTOPRAZOLE SODIUM 40 MG PO TBEC: 40.0000 mg | DELAYED_RELEASE_TABLET | Freq: Every day | ORAL | Status: DC

## 2012-05-15 MED ORDER — ASPIRIN 81 MG PO CHEW: 81.0000 mg | CHEWABLE_TABLET | Freq: Every day | ORAL | Status: DC

## 2012-05-15 NOTE — Discharge Summary (Signed)
Jon [name], Jon [name] NO.:  [id number]  MEDICAL RECORD NO.:  [id number]  LOCATION:  4151                         FACILITY:  [facility]  PHYSICIAN:  Ranelle Oyster, M.D.DATE OF BIRTH:  08-25-1965  DATE OF ADMISSION:  05/09/2012 DATE OF DISCHARGE:                              DISCHARGE SUMMARY   DISCHARGE DIAGNOSES: 1. Embolic cerebellar and occipital lobe infarctions. 2. Chronic Coumadin therapy. 3. Pain management. 4. Aortic stenosis with recent type A dissection with mechanical AVR     valve, April 22, 2012. 5. Hypertension. 6. Right pleural effusion with thoracentesis on May 07, 2012.  HISTORY OF PRESENT ILLNESS:  This is a 47 year old right-handed male with history of aortic stenosis, recent type A dissection requiring emergent repair on April 22, 2012, maintained on chronic Coumadin who was discharged from the hospital on April 23, 2012, only to be readmitted on May 05, 2012, after being found unresponsive by his wife and diaphoretic.  After 2-3 minutes, he came around, he could not recall the period of time he was unresponsive.  There was no report of seizure.  He was having complaints of difficulty focusing on objects. MRI of the brain showed new punctate infarct within the left cerebellum, question of a few other punctate infarcts in the occipital lobe. Cardiac enzymes were negative.  Echocardiogram with ejection fraction of 40% with hypokinesis of the entire anterior septal myocardium.  MRA of the head and neck were unremarkable.  Bouts of bradycardia into the 30s while sleeping of which was asymptomatic.  Followup by Cardiology Services.  Metoprolol was discontinued as he remained on amiodarone. The patient remained on chronic Coumadin therapy.  Intravenous heparin added until INR greater than 2.00.  Hospital course of the right pleural effusion, underwent thoracentesis on May 16, 2012 with 1.8 L of blood-tinged fluid removed.   Followup chest x-ray with no pneumothorax. The patient continued to have issues in relation to focusing and limited vision.  He had seen Ophthalmology during his latest hospital admission. Exam essentially normal with possible need for refraction.  Physical and occupational therapy ongoing.  The patient was admitted for comprehensive rehab program.  PAST MEDICAL HISTORY:  See discharge diagnoses.  SOCIAL HISTORY:  Lives with his wife.  One level home.  FUNCTIONAL STATUS:  Functional history prior to December admission was independent, working full time as a Curator.  Functional status upon admission to rehab service was minimal assist to ambulate 100 feet with a rolling walker.  PHYSICAL EXAMINATION:  Blood pressure 111/72, pulse 71, temperature 97.6, respirations 20.  This is an alert male, oriented x3.  Pupils round and reactive to light without nystagmus.  Cardiac, rate controlled.  Lungs are clear auscultation.  Cardiac, regular rate and rhythm.  Speech was mildly dysarthric, but fully intelligible.  He followed commands.  Gaze was dysconjugate.  He was unable to gaze superiorly and appeared to have some limitations in the inferior fields.  REHABILITATION HOSPITAL COURSE:  The patient was admitted to inpatient rehab services with therapies initiated on a 3-hour daily basis consisting of physical therapy, occupational therapy, and rehabilitation nursing.  The following issues were addressed during the patient's rehabilitation stay.  Pertaining to Mr.  Jon [name]'s embolic cerebellar and occipital lobe infarction, he remained stable.  He would follow up with Neurology Services as well as Dr. Harold [name] at the outpatient rehab service office.  He remained on chronic Coumadin for both stroke prophylaxis as well as recent type A dissection repair with mechanical AVR.  He would follow up with Dr. Elease [name] of Cardiology Services.   He had no bleeding episodes.  His blood  pressures remained well controlled on amiodarone with no orthostasis.  No bowel or bladder disturbances.  He did receive followup by Cardiothoracic Surgery, Dr. Dorris [name] at the recent aortic dissection repair with plan to follow up as outpatient.  The patient received weekly collaborative interdisciplinary team conferences to discuss estimated length of stay, family teaching, and any barriers to his discharge.  He was continent of bowel and bladder, tolerating a regular consistency diet, supervision for activities of daily living, supervision to minimal assist for overall mobility.  He was able to communicate his needs.  He was somewhat impulsive and needed 24-hour supervision which his wife could provide.  Full family teaching was completed.  He was discharged to home.  He did receive follow by Speech Therapy needing some cues for problem solving as well as social interaction.  His recall was 75-89% and again needing some cues.  DISCHARGE MEDICATIONS AT THE TIME OF DICTATION: 1. Percocet 5/325 one tablet every 4 hours as needed pain. 2. Amiodarone 200 mg b.i.d. 3. Aspirin 81 mg daily. 4. Coumadin 5 mg daily adjusted accordingly for an INR of 2.0 to 3.0.  FOLLOWUP APPOINTMENTS:  The patient would follow up with Dr. Daleen [name] at the outpatient rehab service office on June 04, 2012, Dr. Charlett [name] of Cardiothoracic Surgery in 2 weeks, Dr. Renae [name] for medical management, and Dr. Kristeen [name], call for appointment in 2 weeks.  Patient was to followup with [facility] 05/19/2012 check INR while patient remained on chronic Coumadin therapy as well as followup with Dr. Elease [name] of cardiology services     Mariam Dollar, P.A.   ______________________________ Ranelle Oyster, M.D.    DA/MEDQ  D:  05/14/2012  T:  05/15/2012  Job:  [id number]  cc:   [name]. Jon [name], M.D. Annamarie Major, M.D. Ranelle Oyster, M.D.

## 2012-05-15 NOTE — Progress Notes (Signed)
Patient discharge to home with wife at 32.  Discharge instructions given by Harvel Ricks, PA.  Patient and spouse verbalize understanding, no further questions ask.

## 2012-05-15 NOTE — Progress Notes (Signed)
Subjective/Complaints: Excited to go home today.    A 12 point review of systems has been performed and if not noted above is otherwise negative.   Objective: Vital Signs: Blood pressure 98/65, pulse 63, temperature 98.2 F (36.8 C), temperature source Oral, resp. rate 20, height 5\' 11"  (1.803 m), weight 73.71 kg (162 lb 8 oz), SpO2 98.00%. No results found.  Basename 05/12/12 0920  WBC 9.4  HGB 10.7*  HCT 33.6*  PLT 368    Basename 05/12/12 0920  NA 137  K 4.4  CL 101  GLUCOSE 108*  BUN 9  CREATININE 0.73  CALCIUM 8.5   CBG (last 3)  No results found for this basename: GLUCAP:3 in the last 72 hours  Wt Readings from Last 3 Encounters:  05/14/12 73.71 kg (162 lb 8 oz)  05/09/12 84.414 kg (186 lb 1.6 oz)  05/03/12 83.3 kg (183 lb 10.3 oz)    Physical Exam:  HENT: right ear externally intact Head: Normocephalic and atraumatic.  Eyes: Conjunctivae normal are normal. Pupils are equal, round, and reactive to light.  Pupils reactive to light without nystagmus  Neck: Neck supple. No thyromegaly present.  Cardiovascular: Normal rate and regular rhythm.  Cardiac rate control  Pulmonary/Chest: Effort normal and breath sounds normal. He has no wheezes.  Abdominal: Soft. Bowel sounds are normal. He exhibits no distension. There is no tenderness.  Incision clean and dressed  Neurological: He is alert and oriented to person, place, and time.  Speech is mildly dysarthric but intelligible. He does follow simple commands. Noted decreased fine motor skills in RUE. Mild weakness right arm. Gaze dysconjugate. Upward gaze showing improvement. Lateral gaze grossly intact. Limited depth perception. No focal visual field cuts. Moves a little impulsively. Balance improving. No sensory deficits.  Skin: Skin is warm and dry. Hearing a little less through the right ear. Psychiatric: He has a normal mood and affect. In good spirits. His behavior is normal   Assessment/Plan: 1. Functional  deficits secondary to embolic occipital and cerebellar infarcts which require 3+ hours per day of interdisciplinary therapy in a comprehensive inpatient rehab setting. Physiatrist is providing close team supervision and 24 hour management of active medical problems listed below. Physiatrist and rehab team continue to assess barriers to discharge/monitor patient progress toward functional and medical goals.  Stroke education daily for pt and wife. i will see him back in my office in one month  FIM: FIM - Bathing Bathing Steps Patient Completed: Chest;Right Arm;Left Arm;Abdomen;Front perineal area;Buttocks;Right upper leg;Left upper leg;Right lower leg (including foot);Left lower leg (including foot) Bathing: 5: Supervision: Safety issues/verbal cues  FIM - Upper Body Dressing/Undressing Upper body dressing/undressing steps patient completed: Thread/unthread right sleeve of pullover shirt/dresss;Thread/unthread left sleeve of pullover shirt/dress;Put head through opening of pull over shirt/dress;Pull shirt over trunk Upper body dressing/undressing: 7: Complete Independence: No helper FIM - Lower Body Dressing/Undressing Lower body dressing/undressing steps patient completed: Thread/unthread right underwear leg;Thread/unthread left underwear leg;Pull underwear up/down;Thread/unthread right pants leg;Thread/unthread left pants leg;Pull pants up/down;Fasten/unfasten pants;Don/Doff right sock;Don/Doff left sock;Don/Doff right shoe;Don/Doff left shoe;Fasten/unfasten right shoe;Fasten/unfasten left shoe Lower body dressing/undressing: 5: Supervision: Safety issues/verbal cues  FIM - Toileting Toileting steps completed by patient: Adjust clothing prior to toileting;Performs perineal hygiene;Adjust clothing after toileting Toileting Assistive Devices: Grab bar or rail for support Toileting: 6: More than reasonable amount of time  FIM - Archivist Transfers: 5-To toilet/BSC: Supervision  (verbal cues/safety issues);5-From toilet/BSC: Supervision (verbal cues/safety issues)  FIM - Banker  Devices: Arm rests Bed/Chair Transfer: 5: Chair or W/C > Bed: Supervision (verbal cues/safety issues);5: Bed > Chair or W/C: Supervision (verbal cues/safety issues)  FIM - Locomotion: Wheelchair Distance: 0 Locomotion: Wheelchair: 0: Activity did not occur FIM - Locomotion: Ambulation Ambulation/Gait Assistance: 5: Supervision Locomotion: Ambulation: 5: Travels 150 ft or more with supervision/safety issues  Comprehension Comprehension Mode: Auditory Comprehension: 5-Follows basic conversation/direction: With no assist  Expression Expression Mode: Verbal Expression: 5-Expresses basic needs/ideas: With no assist  Social Interaction Social Interaction: 5-Interacts appropriately 90% of the time - Needs monitoring or encouragement for participation or interaction.  Problem Solving Problem Solving: 5-Solves basic 90% of the time/requires cueing < 10% of the time  Memory Memory: 5-Recognizes or recalls 90% of the time/requires cueing < 10% of the time  Medical Problem List and Plan:  1. embolic cerebellar and occipital lobe infarcts  2. DVT Prophylaxis/Anticoagulation: Chronic Coumadin therapy. outpt follow up of inr 3. Pain Management: Percocet one tablet every 4 hours as needed severe pain. Monitor the increased mobility  4. Neuropsych: This patient is capable of making decisions on his/her own behalf.  5. 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 Followup per cardiology services  6. Hypertension. Amiodarone 200 mg twice a day.  7. Right pleural effusion. Status post thoracentesis 05/08/2011. Followup chest x-ray with no pneumothorax. Off O2 8. Right ear "buzzing"--frequent hunter, shooter PTA (didn't wear ear muffs. i supsect this is main problem   9. Urinary frequency-   urine analysis and culture were  negative LOS (Days) 6 A FACE TO FACE EVALUATION WAS PERFORMED  Tiyana Galla T 05/15/2012 8:28 AM

## 2012-05-15 NOTE — Progress Notes (Signed)
Social Work  Discharge Note  The overall goal for the admission was met for:   Discharge location: Yes - home with wife and family to provide 24/7 supervision  Length of Stay: Yes - 6 days  Discharge activity level: Yes - supervision  Home/community participation: Yes  Services provided included: MD, RD, PT, OT, SLP, RN, TR, Pharmacy, Neuropsych and SW  Financial Services: Other: NONE (Medicaid application pending)  Follow-up services arranged: Outpatient: OT, ST via [facility] and Patient/Family has no preference for HH/DME agencies  Comments (or additional information):  Patient/Family verbalized understanding of follow-up arrangements: Yes  Individual responsible for coordination of the follow-up plan: patient  Confirmed correct DME delivered: NA  Bronc Brosseau

## 2012-05-19 ENCOUNTER — Telehealth: Payer: Self-pay

## 2012-05-19 NOTE — Telephone Encounter (Signed)
msg left x2, made app with Norma Fredrickson np and coumadin clinic, asked pt to call back to confirm.

## 2012-05-19 NOTE — Telephone Encounter (Signed)
lmtcb

## 2012-05-19 NOTE — Telephone Encounter (Signed)
New Problem:    Patient's wife called in wanting her husband to be followed for coumadin in our clinic.  Claims that the patient was discharged form the hospital on 05/16/11 and was told that Dr. Elease Hashimoto would follow the patient.  Was also assured that they could have his coumadin checked at [facility].  Dr. Elease Hashimoto consulted the patient while in [facility].  Latest note is form 05/09/12.  Please call back.

## 2012-05-22 ENCOUNTER — Other Ambulatory Visit: Payer: Self-pay | Admitting: *Deleted

## 2012-05-22 ENCOUNTER — Telehealth: Payer: Self-pay | Admitting: *Deleted

## 2012-05-22 DIAGNOSIS — I712 Thoracic aortic aneurysm, without rupture: Secondary | ICD-10-CM

## 2012-05-22 NOTE — Telephone Encounter (Signed)
Spoke with Pt's wife and she states that she had set pt up and had his coumadin level checked in [place] with [facility]. She states that since they live in [place] it's just more convenient for them. Thus, the appt's  that pt has schedule Dr Sallee Provencal  nurse will cancel since she made those appt.

## 2012-05-23 ENCOUNTER — Ambulatory Visit: Payer: Self-pay | Admitting: Nurse Practitioner

## 2012-05-26 NOTE — Telephone Encounter (Signed)
See later note, pt being cared for by another provider, no app needed.

## 2012-05-27 ENCOUNTER — Telehealth: Payer: Self-pay | Admitting: *Deleted

## 2012-05-27 ENCOUNTER — Ambulatory Visit
Admission: RE | Admit: 2012-05-27 | Discharge: 2012-05-27 | Disposition: A | Discharge status: Home / Self Care | Payer: No Typology Code available for payment source | Source: Ambulatory Visit | Attending: Thoracic Surgery (Cardiothoracic Vascular Surgery) | Admitting: Thoracic Surgery (Cardiothoracic Vascular Surgery)

## 2012-05-27 ENCOUNTER — Encounter: Payer: Self-pay | Admitting: Thoracic Surgery (Cardiothoracic Vascular Surgery)

## 2012-05-27 ENCOUNTER — Ambulatory Visit (INDEPENDENT_AMBULATORY_CARE_PROVIDER_SITE_OTHER): Payer: Self-pay | Admitting: Thoracic Surgery (Cardiothoracic Vascular Surgery)

## 2012-05-27 VITALS — BP 130/75 | HR 53 | Resp 18 | Ht 71.0 in | Wt 162.0 lb

## 2012-05-27 DIAGNOSIS — Z952 Presence of prosthetic heart valve: Secondary | ICD-10-CM

## 2012-05-27 DIAGNOSIS — I7100 Dissection of unspecified site of aorta: Secondary | ICD-10-CM

## 2012-05-27 DIAGNOSIS — Z954 Presence of other heart-valve replacement: Secondary | ICD-10-CM

## 2012-05-27 DIAGNOSIS — I639 Cerebral infarction, unspecified: Secondary | ICD-10-CM

## 2012-05-27 DIAGNOSIS — Z951 Presence of aortocoronary bypass graft: Secondary | ICD-10-CM

## 2012-05-27 DIAGNOSIS — I712 Thoracic aortic aneurysm, without rupture: Secondary | ICD-10-CM

## 2012-05-27 NOTE — Progress Notes (Signed)
HPI:  Jon [name] is a 47 year old gentleman who presented with acute type I dissection. He originally experienced transient blindness and chest pain. He underwent emergent repair the type I dissection. His aortic valve was replaced with a mechanical valve. He did have a stroke and has continued to have problems with his vision. He says that he has been ambulating up to 2400 feet at a time. He's not having any problems with shortness of breath. He does have new left leg numbness and his wife says that his gait is changed since he was discharged in the hospital. They say that Dr. Samuel Germany has ordered another CT scan to see if there's been a new stroke. He is also working on getting him into see a neuro ophthalmologist. He was seen by an ophthalmologist while in the hospital and there was no structural problem with his eyes.  Past Medical History  Diagnosis Date  . Anxiety   . Bicuspid aortic valve   . Aortic stenosis     mechanical AVR 04/22/12  . Shortness of breath   . Dysrhythmia   . Thoracic aortic aneurysm     replacement aortic root 04/22/12  . CVA (cerebral infarction)     occipital CVA 12/13   type I aortic dissection    Current Outpatient Prescriptions  Medication Sig Dispense Refill  . amiodarone (PACERONE) 400 MG tablet Take 0.5 tablets (200 mg total) by mouth 2 (two) times daily.  60 tablet  1  . aspirin 81 MG chewable tablet Chew 1 tablet (81 mg total) by mouth daily.      . busPIRone (BUSPAR) 15 MG tablet Take 15 mg by mouth 2 (two) times daily. 1/2 tab bid      . gabapentin (NEURONTIN) 300 MG capsule Take 300 mg by mouth 3 (three) times daily.      Marland Kitchen omeprazole (PRILOSEC) 20 MG capsule Take 20 mg by mouth daily.      Marland Kitchen warfarin (COUMADIN) 5 MG tablet Take 1 tablet (5 mg total) by mouth daily at 6 PM.  100 tablet  1    Physical Exam BP 130/75  Pulse 53  Resp 18  Ht 5\' 11"  (1.803 m)  Wt 162 lb (73.483 kg)  BMI 22.59 kg/m2  SpO90 25% 47 year old male in no acute  distress Neuro good strength in all 4 extremities, vaguely slurred speech, decreased visual acuity Cardiac regular rate and rhythm with a good valve click no murmur Sternum stable, incision clean dry and intact Lungs clear with equal breath sounds bilaterally Extremities incisions well healed, small seroma right groin-nontender, no erythema  Diagnostic Tests: Chest x-ray 05/27/2012 shows postoperative changes good aeration lungs bilaterally, no effusions or infiltrates  Impression: [name] is a 47 year old gentleman who underwent emergent repair of a type I aortic dissection with a mechanical valve conduit on 04/22/2013. His arch did have to be replaced with separate sidebranch grafts to the innominate and left common carotid.  His postoperative course was complicated visual changes and difficulty focusing on objects. 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 This has not improved significantly since his discharge from the hospital. It is concerning that he may have had another cerebral event given the new numbness in the left leg. I agree that repeat CT scan is a good idea and also concur with a plan for referral to neuro-ophthalmologist.  He does have a mechanical valve. His INR is being followed by [facility]. The  goal INR should be 2.5-3.5 given that he has a mechanical valve.  He is requesting aspiration of the seroma. We will plan to proceed with that today.  Procedure  The skin overlying the seroma was anesthetized with cold spray. It was then sterilely prepped and aspiration was performed removing 18 mL of serosanguineous fluid. He tolerated this well. The seroma was nearly completely resolved after aspiration.  Plan:  I will plan to see him back in 3 weeks to check on his seroma and his overall progress.

## 2012-05-27 NOTE — Telephone Encounter (Signed)
Received call from [place], [place] at [facility]. He would like to get orders to do PT with patient. He is having some gait and balance problems. Please advise.

## 2012-05-27 NOTE — Telephone Encounter (Signed)
PT is fine

## 2012-05-27 NOTE — Telephone Encounter (Signed)
Left message.  Verbal orders given.

## 2012-06-04 ENCOUNTER — Encounter: Payer: Self-pay | Admitting: Physical Medicine & Rehabilitation

## 2012-06-10 ENCOUNTER — Encounter (HOSPITAL_COMMUNITY): Payer: Self-pay | Admitting: Pharmacy Technician

## 2012-06-10 ENCOUNTER — Encounter: Payer: Self-pay | Admitting: Thoracic Surgery (Cardiothoracic Vascular Surgery)

## 2012-06-10 ENCOUNTER — Ambulatory Visit (INDEPENDENT_AMBULATORY_CARE_PROVIDER_SITE_OTHER): Payer: Medicaid Other | Admitting: Thoracic Surgery (Cardiothoracic Vascular Surgery)

## 2012-06-10 ENCOUNTER — Other Ambulatory Visit: Payer: Self-pay

## 2012-06-10 ENCOUNTER — Encounter (HOSPITAL_COMMUNITY): Payer: Self-pay

## 2012-06-10 VITALS — BP 105/66 | HR 59 | Resp 20 | Ht 71.0 in | Wt 162.0 lb

## 2012-06-10 DIAGNOSIS — IMO0002 Reserved for concepts with insufficient information to code with codable children: Secondary | ICD-10-CM

## 2012-06-10 DIAGNOSIS — Z954 Presence of other heart-valve replacement: Secondary | ICD-10-CM

## 2012-06-10 DIAGNOSIS — I7100 Dissection of unspecified site of aorta: Secondary | ICD-10-CM

## 2012-06-10 DIAGNOSIS — Z951 Presence of aortocoronary bypass graft: Secondary | ICD-10-CM

## 2012-06-10 DIAGNOSIS — Z952 Presence of prosthetic heart valve: Secondary | ICD-10-CM

## 2012-06-10 MED ORDER — VANCOMYCIN HCL IN DEXTROSE 1-5 GM/200ML-% IV SOLN
1000.0000 mg | INTRAVENOUS | Status: AC
  Administered 2012-06-11: 1000 mg via INTRAVENOUS

## 2012-06-10 NOTE — Progress Notes (Signed)
HPI:  Jon [name] returns today for reevaluation Jon right thigh seroma. He underwent repair of a type I aortic dissection on light. Jon dissection and surgery were complicated by visual disruption, which continues to trouble him. He's been seen by neurology, ophthalmology and neuro-ophthalmology regarding that problem. He was last seen in the office he was complaining of a lump in Jon right groin. This was a seroma from Jon vein harvest site. We aspirated that and got a small amount of serosanguinous fluid. He says that he noticed the last week that it was getting red and more tender. He saw Dr. Gage prescribed an antibiotic, probably Bactrim. Jon [name] says that they do have Jon INR therapeutic now on a dose of 12.5 mg of Coumadin daily.  Past Medical History  Diagnosis Date  . Anxiety   . Bicuspid aortic valve   . Aortic stenosis     mechanical AVR 04/22/12  . Shortness of breath   . Dysrhythmia   . Thoracic aortic aneurysm     replacement aortic root 04/22/12  . CVA (cerebral infarction)     occipital CVA 12/13      Current Outpatient Prescriptions  Medication Sig Dispense Refill  . amiodarone (PACERONE) 400 MG tablet Take 0.5 tablets (200 mg total) by mouth 2 (two) times daily.  60 tablet  1  . aspirin 81 MG chewable tablet Chew 1 tablet (81 mg total) by mouth daily.      . baclofen (LIORESAL) 20 MG tablet Take 20 mg by mouth 2 (two) times daily.      . busPIRone (BUSPAR) 15 MG tablet Take 15 mg by mouth 2 (two) times daily. 1/2 tab bid      . calcium-vitamin D (OSCAL WITH D) 500-200 MG-UNIT per tablet Take 1 tablet by mouth 2 (two) times daily.      . ergocalciferol (VITAMIN D2) 50000 UNITS capsule Take 50,000 Units by mouth once a week.      . gabapentin (NEURONTIN) 300 MG capsule Take 300 mg by mouth 3 (three) times daily.      . omeprazole (PRILOSEC) 20 MG capsule Take 20 mg by mouth daily.      . warfarin (COUMADIN) 5 MG tablet Take 1 tablet (5 mg total) by mouth daily at 6  PM.  100 tablet  1   Family History  Problem Relation Age of Onset  . Lung disease Mother   . Diabetes Father   . Peripheral Artery Disease Father   . Hypertension Father    History   Social History  . Marital Status: Married    Spouse Name: N/A    Number of Children: 2  . Years of Education: N/A   Occupational History  . disabled   . mechanic    Social History Main Topics  . Smoking status: Former Smoker -- 2.5 packs/day    Types: Cigarettes    Start date: 04/21/2012  . Smokeless tobacco: Not on file  . Alcohol Use: Yes     Comment: last drink 1.5 years ago, heavy drinker prior  . Drug Use: No     Comment: previous crack cocaine "quit 4 years ago"  . Sexually Active: Yes   Other Topics Concern  . Not on file   Social History Narrative  . No narrative on file   Physical Exam 46-year-old white male in no acute distress General well-developed well-nourished Neurologic alert and oriented x3, no focal motor deficits, impaired visual Cardiac regular rate and rhythm normal   S1 and S2 no rubs or gallops Lungs equal breath sounds bilaterally Abdomen soft tender Extremities firm tender mildly erythematous area in the upper right thigh association with a healed incision consistent with a seroma or hematoma  Diagnostic Tests: MRI brain 06/09/12  Impression: 46-year-old gentleman status post repair of a type I dissection with a right thigh seroma that appears to be secondarily infected. We attempted to drain this with a needle in the office, but were unable to completely drain the seroma. The fluid we did get consistent with old hematoma. I think the best option would be taken to the operating room for incision and drainage and probable VAC placement to definitively treat this wound. I discussed with he and Jon [name] the indications, risks, benefits, and alternatives. They understand the risk of bleeding, infection, and recurrent seroma and agreed to proceed.  Plan: Plan  incision and drainage and possible VAC placement right thigh seroma tomorrow in the OR. We will plan to keep him overnight after the procedure.   

## 2012-06-11 ENCOUNTER — Encounter (HOSPITAL_COMMUNITY): Payer: Self-pay | Admitting: *Deleted

## 2012-06-11 ENCOUNTER — Encounter (HOSPITAL_COMMUNITY): Payer: Self-pay

## 2012-06-11 ENCOUNTER — Inpatient Hospital Stay (HOSPITAL_COMMUNITY): Payer: Medicaid Other | Admitting: Anesthesiology

## 2012-06-11 ENCOUNTER — Encounter (HOSPITAL_COMMUNITY)
Admission: RE | Disposition: A | Discharge status: Home / Self Care | Payer: Self-pay | Source: Ambulatory Visit | Attending: Thoracic Surgery (Cardiothoracic Vascular Surgery)

## 2012-06-11 ENCOUNTER — Observation Stay (HOSPITAL_COMMUNITY)
Admission: RE | Admit: 2012-06-11 | Discharge: 2012-06-13 | Disposition: A | Discharge status: Home / Self Care | Payer: Medicaid Other | Source: Ambulatory Visit | Attending: Thoracic Surgery (Cardiothoracic Vascular Surgery) | Admitting: Thoracic Surgery (Cardiothoracic Vascular Surgery)

## 2012-06-11 ENCOUNTER — Encounter (HOSPITAL_COMMUNITY): Payer: Self-pay | Admitting: Anesthesiology

## 2012-06-11 DIAGNOSIS — F411 Generalized anxiety disorder: Secondary | ICD-10-CM | POA: Insufficient documentation

## 2012-06-11 DIAGNOSIS — Y838 Other surgical procedures as the cause of abnormal reaction of the patient, or of later complication, without mention of misadventure at the time of the procedure: Secondary | ICD-10-CM | POA: Insufficient documentation

## 2012-06-11 DIAGNOSIS — J189 Pneumonia, unspecified organism: Secondary | ICD-10-CM | POA: Insufficient documentation

## 2012-06-11 DIAGNOSIS — L02419 Cutaneous abscess of limb, unspecified: Secondary | ICD-10-CM

## 2012-06-11 DIAGNOSIS — L03119 Cellulitis of unspecified part of limb: Secondary | ICD-10-CM

## 2012-06-11 DIAGNOSIS — Z79899 Other long term (current) drug therapy: Secondary | ICD-10-CM | POA: Insufficient documentation

## 2012-06-11 DIAGNOSIS — R300 Dysuria: Secondary | ICD-10-CM | POA: Insufficient documentation

## 2012-06-11 DIAGNOSIS — I359 Nonrheumatic aortic valve disorder, unspecified: Secondary | ICD-10-CM | POA: Insufficient documentation

## 2012-06-11 DIAGNOSIS — Z9889 Other specified postprocedural states: Secondary | ICD-10-CM | POA: Insufficient documentation

## 2012-06-11 DIAGNOSIS — Z7982 Long term (current) use of aspirin: Secondary | ICD-10-CM | POA: Insufficient documentation

## 2012-06-11 DIAGNOSIS — IMO0002 Reserved for concepts with insufficient information to code with codable children: Principal | ICD-10-CM

## 2012-06-11 DIAGNOSIS — Z7901 Long term (current) use of anticoagulants: Secondary | ICD-10-CM | POA: Insufficient documentation

## 2012-06-11 DIAGNOSIS — Z8673 Personal history of transient ischemic attack (TIA), and cerebral infarction without residual deficits: Secondary | ICD-10-CM | POA: Insufficient documentation

## 2012-06-11 HISTORY — DX: Headache: R51

## 2012-06-11 HISTORY — DX: Cerebral infarction, unspecified: I63.9

## 2012-06-11 HISTORY — PX: APPLICATION OF WOUND VAC: SHX5189

## 2012-06-11 HISTORY — DX: Hypothyroidism, unspecified: E03.9

## 2012-06-11 HISTORY — DX: Gastro-esophageal reflux disease without esophagitis: K21.9

## 2012-06-11 HISTORY — PX: I & D EXTREMITY: SHX5045

## 2012-06-11 LAB — CBC
HCT: 37 % — ABNORMAL LOW (ref 39.0–52.0)
Hemoglobin: 12.1 g/dL — ABNORMAL LOW (ref 13.0–17.0)
MCV: 91.1 fL (ref 78.0–100.0)
RBC: 4.06 MIL/uL — ABNORMAL LOW (ref 4.22–5.81)
WBC: 7.3 10*3/uL (ref 4.0–10.5)

## 2012-06-11 LAB — URINALYSIS, ROUTINE W REFLEX MICROSCOPIC
Glucose, UA: NEGATIVE mg/dL
Leukocytes, UA: NEGATIVE
Nitrite: NEGATIVE
Protein, ur: NEGATIVE mg/dL
pH: 6 (ref 5.0–8.0)

## 2012-06-11 LAB — COMPREHENSIVE METABOLIC PANEL
ALT: 41 U/L (ref 0–53)
AST: 29 U/L (ref 0–37)
Calcium: 9.3 mg/dL (ref 8.4–10.5)
Sodium: 137 mEq/L (ref 135–145)
Total Protein: 7.3 g/dL (ref 6.0–8.3)

## 2012-06-11 LAB — APTT: aPTT: 47 seconds — ABNORMAL HIGH (ref 24–37)

## 2012-06-11 SURGERY — IRRIGATION AND DEBRIDEMENT EXTREMITY
Anesthesia: Monitor Anesthesia Care | Site: Leg Upper | Laterality: Right | Wound class: Contaminated

## 2012-06-11 MED ORDER — BACLOFEN 20 MG PO TABS
20.0000 mg | ORAL_TABLET | Freq: Two times a day (BID) | ORAL | Status: DC
  Administered 2012-06-11 – 2012-06-13 (×4): 20 mg via ORAL
  Filled 2012-06-11 (×6): qty 1

## 2012-06-11 MED ORDER — ACETAMINOPHEN 325 MG PO TABS
650.0000 mg | ORAL_TABLET | Freq: Four times a day (QID) | ORAL | Status: DC | PRN
  Administered 2012-06-11 – 2012-06-12 (×2): 650 mg via ORAL
  Filled 2012-06-11: qty 2
  Filled 2012-06-11: qty 1
  Filled 2012-06-11: qty 2

## 2012-06-11 MED ORDER — PANTOPRAZOLE SODIUM 40 MG PO TBEC
40.0000 mg | DELAYED_RELEASE_TABLET | Freq: Every day | ORAL | Status: DC
  Administered 2012-06-13: 40 mg via ORAL
  Filled 2012-06-11: qty 1

## 2012-06-11 MED ORDER — ZOLPIDEM TARTRATE 5 MG PO TABS
10.0000 mg | ORAL_TABLET | Freq: Every evening | ORAL | Status: DC | PRN
  Administered 2012-06-12: 10 mg via ORAL
  Filled 2012-06-11: qty 2

## 2012-06-11 MED ORDER — ALPRAZOLAM 0.25 MG PO TABS
0.2500 mg | ORAL_TABLET | Freq: Three times a day (TID) | ORAL | Status: DC | PRN
  Administered 2012-06-12: 0.25 mg via ORAL
  Filled 2012-06-11: qty 1

## 2012-06-11 MED ORDER — PHENYLEPHRINE HCL 10 MG/ML IJ SOLN
INTRAMUSCULAR | Status: DC | PRN
  Administered 2012-06-11: 40 ug via INTRAVENOUS
  Administered 2012-06-11: 80 ug via INTRAVENOUS

## 2012-06-11 MED ORDER — LACTATED RINGERS IV SOLN
INTRAVENOUS | Status: DC
  Administered 2012-06-11: 11:00:00 via INTRAVENOUS

## 2012-06-11 MED ORDER — LIDOCAINE-EPINEPHRINE (PF) 1 %-1:200000 IJ SOLN
INTRAMUSCULAR | Status: AC
  Filled 2012-06-11: qty 10

## 2012-06-11 MED ORDER — HYDROMORPHONE HCL PF 1 MG/ML IJ SOLN: 0.2500 mg | INTRAMUSCULAR | Status: DC | PRN

## 2012-06-11 MED ORDER — 0.9 % SODIUM CHLORIDE (POUR BTL) OPTIME
TOPICAL | Status: DC | PRN
  Administered 2012-06-11: 2000 mL

## 2012-06-11 MED ORDER — OXYCODONE HCL 5 MG PO TABS: 5.0000 mg | ORAL_TABLET | ORAL | Status: DC | PRN

## 2012-06-11 MED ORDER — EPHEDRINE SULFATE 50 MG/ML IJ SOLN
INTRAMUSCULAR | Status: DC | PRN
  Administered 2012-06-11: 10 mg via INTRAVENOUS

## 2012-06-11 MED ORDER — PROPOFOL INFUSION 10 MG/ML OPTIME
INTRAVENOUS | Status: DC | PRN
  Administered 2012-06-11: 100 ug/kg/min via INTRAVENOUS

## 2012-06-11 MED ORDER — OXYCODONE HCL 5 MG PO TABS: 5.0000 mg | ORAL_TABLET | Freq: Once | ORAL | Status: DC | PRN

## 2012-06-11 MED ORDER — AMIODARONE HCL 200 MG PO TABS
200.0000 mg | ORAL_TABLET | Freq: Two times a day (BID) | ORAL | Status: DC
  Administered 2012-06-11 – 2012-06-13 (×4): 200 mg via ORAL
  Filled 2012-06-11 (×5): qty 1

## 2012-06-11 MED ORDER — OXYCODONE HCL 5 MG/5ML PO SOLN: 5.0000 mg | Freq: Once | ORAL | Status: DC | PRN

## 2012-06-11 MED ORDER — GABAPENTIN 300 MG PO CAPS
300.0000 mg | ORAL_CAPSULE | Freq: Three times a day (TID) | ORAL | Status: DC
  Administered 2012-06-11 – 2012-06-13 (×5): 300 mg via ORAL
  Filled 2012-06-11 (×7): qty 1

## 2012-06-11 MED ORDER — MUPIROCIN 2 % EX OINT
TOPICAL_OINTMENT | CUTANEOUS | Status: AC
  Filled 2012-06-11: qty 22

## 2012-06-11 MED ORDER — BUSPIRONE HCL 15 MG PO TABS
7.5000 mg | ORAL_TABLET | Freq: Two times a day (BID) | ORAL | Status: DC
  Administered 2012-06-11 – 2012-06-13 (×4): 7.5 mg via ORAL
  Filled 2012-06-11 (×5): qty 1

## 2012-06-11 MED ORDER — LEVOTHYROXINE SODIUM 25 MCG PO TABS
25.0000 ug | ORAL_TABLET | Freq: Every day | ORAL | Status: DC
  Administered 2012-06-11 – 2012-06-13 (×3): 25 ug via ORAL
  Filled 2012-06-11 (×4): qty 1

## 2012-06-11 MED ORDER — ONDANSETRON HCL 4 MG/2ML IJ SOLN: 4.0000 mg | Freq: Once | INTRAMUSCULAR | Status: DC | PRN

## 2012-06-11 MED ORDER — WARFARIN SODIUM 5 MG PO TABS
5.0000 mg | ORAL_TABLET | Freq: Every day | ORAL | Status: DC
  Administered 2012-06-11 – 2012-06-12 (×2): 5 mg via ORAL
  Filled 2012-06-11 (×3): qty 1

## 2012-06-11 MED ORDER — MEPERIDINE HCL 25 MG/ML IJ SOLN: 6.2500 mg | INTRAMUSCULAR | Status: DC | PRN

## 2012-06-11 MED ORDER — MIDAZOLAM HCL 5 MG/5ML IJ SOLN
INTRAMUSCULAR | Status: DC | PRN
  Administered 2012-06-11 (×2): 1 mg via INTRAVENOUS

## 2012-06-11 MED ORDER — ONDANSETRON HCL 4 MG/2ML IJ SOLN
INTRAMUSCULAR | Status: DC | PRN
  Administered 2012-06-11: 4 mg via INTRAVENOUS

## 2012-06-11 MED ORDER — OXYCODONE HCL 5 MG PO TABS
10.0000 mg | ORAL_TABLET | ORAL | Status: DC | PRN
  Administered 2012-06-11 – 2012-06-12 (×2): 10 mg via ORAL
  Filled 2012-06-11 (×3): qty 2

## 2012-06-11 MED ORDER — GI COCKTAIL ~~LOC~~
30.0000 mL | Freq: Three times a day (TID) | ORAL | Status: DC | PRN
  Filled 2012-06-11: qty 30

## 2012-06-11 MED ORDER — PROPOFOL 10 MG/ML IV BOLUS
INTRAVENOUS | Status: DC | PRN
  Administered 2012-06-11: 30 mg via INTRAVENOUS
  Administered 2012-06-11: 20 mg via INTRAVENOUS

## 2012-06-11 MED ORDER — WARFARIN - PHYSICIAN DOSING INPATIENT
Freq: Every day | Status: DC
  Administered 2012-06-11: 18:00:00

## 2012-06-11 MED ORDER — LACTATED RINGERS IV SOLN
INTRAVENOUS | Status: DC | PRN
  Administered 2012-06-11: 11:00:00 via INTRAVENOUS

## 2012-06-11 MED ORDER — ASPIRIN 81 MG PO CHEW
81.0000 mg | CHEWABLE_TABLET | Freq: Every day | ORAL | Status: DC
  Administered 2012-06-13: 81 mg via ORAL
  Filled 2012-06-11: qty 1

## 2012-06-11 MED ORDER — LIDOCAINE-EPINEPHRINE (PF) 1 %-1:200000 IJ SOLN
INTRAMUSCULAR | Status: DC | PRN
  Administered 2012-06-11: 10 mL via INTRADERMAL

## 2012-06-11 MED ORDER — CALCIUM CARBONATE-VITAMIN D 500-200 MG-UNIT PO TABS
1.0000 | ORAL_TABLET | Freq: Two times a day (BID) | ORAL | Status: DC
  Administered 2012-06-11 – 2012-06-13 (×4): 1 via ORAL
  Filled 2012-06-11 (×6): qty 1

## 2012-06-11 MED ORDER — FENTANYL CITRATE 0.05 MG/ML IJ SOLN
INTRAMUSCULAR | Status: DC | PRN
  Administered 2012-06-11 (×5): 50 ug via INTRAVENOUS

## 2012-06-11 SURGICAL SUPPLY — 40 items
BAG ISOLATION DRAPE 18X18 (DRAPES) ×1 IMPLANT
BANDAGE ELASTIC 4 VELCRO ST LF (GAUZE/BANDAGES/DRESSINGS) IMPLANT
BANDAGE ELASTIC 6 VELCRO ST LF (GAUZE/BANDAGES/DRESSINGS) IMPLANT
BANDAGE GAUZE ELAST BULKY 4 IN (GAUZE/BANDAGES/DRESSINGS) IMPLANT
CANISTER SUCTION 2500CC (MISCELLANEOUS) ×2 IMPLANT
CLOTH BEACON ORANGE TIMEOUT ST (SAFETY) ×2 IMPLANT
COVER SURGICAL LIGHT HANDLE (MISCELLANEOUS) ×2 IMPLANT
DRAPE INCISE IOBAN 66X45 STRL (DRAPES) ×2 IMPLANT
DRAPE ISOLATION BAG 18X18 (DRAPES) ×1
DRAPE ORTHO SPLIT 77X108 STRL (DRAPES) ×1
DRAPE SURG ORHT 6 SPLT 77X108 (DRAPES) ×1 IMPLANT
DRSG VAC ATS SM SENSATRAC (GAUZE/BANDAGES/DRESSINGS) ×2 IMPLANT
ELECT REM PT RETURN 9FT ADLT (ELECTROSURGICAL) ×2
ELECTRODE REM PT RTRN 9FT ADLT (ELECTROSURGICAL) ×1 IMPLANT
GLOVE BIOGEL PI IND STRL 6.5 (GLOVE) ×2 IMPLANT
GLOVE BIOGEL PI INDICATOR 6.5 (GLOVE) ×2
GLOVE EUDERMIC 7 POWDERFREE (GLOVE) IMPLANT
GLOVE SURG SIGNA 7.5 PF LTX (GLOVE) ×2 IMPLANT
GLOVE SURG SS PI 6.0 STRL IVOR (GLOVE) ×2 IMPLANT
GOWN PREVENTION PLUS XLARGE (GOWN DISPOSABLE) ×6 IMPLANT
GOWN STRL NON-REIN LRG LVL3 (GOWN DISPOSABLE) ×2 IMPLANT
KIT BASIN OR (CUSTOM PROCEDURE TRAY) ×2 IMPLANT
KIT ROOM TURNOVER OR (KITS) ×2 IMPLANT
NEEDLE HYPO 25GX1X1/2 BEV (NEEDLE) ×2 IMPLANT
NS IRRIG 1000ML POUR BTL (IV SOLUTION) ×2 IMPLANT
PACK GENERAL/GYN (CUSTOM PROCEDURE TRAY) ×2 IMPLANT
PACK UNIVERSAL I (CUSTOM PROCEDURE TRAY) ×2 IMPLANT
PAD ARMBOARD 7.5X6 YLW CONV (MISCELLANEOUS) ×4 IMPLANT
SPONGE GAUZE 4X4 12PLY (GAUZE/BANDAGES/DRESSINGS) ×2 IMPLANT
STAPLER VISISTAT 35W (STAPLE) IMPLANT
SUT VIC AB 2-0 CTX 36 (SUTURE) ×2 IMPLANT
SUT VIC AB 3-0 SH 27 (SUTURE) ×1
SUT VIC AB 3-0 SH 27X BRD (SUTURE) ×1 IMPLANT
SUT VIC AB 3-0 X1 27 (SUTURE) IMPLANT
SWAB COLLECTION DEVICE MRSA (MISCELLANEOUS) ×2 IMPLANT
SYR CONTROL 10ML LL (SYRINGE) ×2 IMPLANT
TOWEL OR 17X24 6PK STRL BLUE (TOWEL DISPOSABLE) ×4 IMPLANT
TOWEL OR 17X26 10 PK STRL BLUE (TOWEL DISPOSABLE) ×4 IMPLANT
TUBE ANAEROBIC SPECIMEN COL (MISCELLANEOUS) ×2 IMPLANT
WATER STERILE IRR 1000ML POUR (IV SOLUTION) ×2 IMPLANT

## 2012-06-11 NOTE — Anesthesia Postprocedure Evaluation (Signed)
Anesthesia Post Note  Patient: Jon [name]  Procedure(s) Performed: Procedure(s) (LRB): IRRIGATION AND DEBRIDEMENT EXTREMITY (Right) APPLICATION OF WOUND VAC (Right)  Anesthesia type: general  Patient location: PACU  Post pain: Pain level controlled  Post assessment: Patient's Cardiovascular Status Stable  Last Vitals:  Filed Vitals:   06/11/12 1345  BP: 101/63  Pulse: 58  Temp:   Resp: 17    Post vital signs: Reviewed and stable  Level of consciousness: sedated  Complications: No apparent anesthesia complications

## 2012-06-11 NOTE — H&P (View-Only) (Signed)
HPI:  Jon [name] returns today for reevaluation his right thigh seroma. He underwent repair of a type I aortic dissection on light. His dissection and surgery were complicated by visual disruption, which continues to trouble him. He's been seen by neurology, ophthalmology and neuro-ophthalmology regarding that problem. He was last seen in the office he was complaining of a lump in his right groin. This was a seroma from his vein harvest site. We aspirated that and got a small amount of serosanguinous fluid. He says that he noticed the last week that it was getting red and more tender. He saw Dr. Samuel Germany prescribed an antibiotic, probably Bactrim. His wife says that they do have his INR therapeutic now on a dose of 12.5 mg of Coumadin daily.  Past Medical History  Diagnosis Date  . Anxiety   . Bicuspid aortic valve   . Aortic stenosis     mechanical AVR 04/22/12  . Shortness of breath   . Dysrhythmia   . Thoracic aortic aneurysm     replacement aortic root 04/22/12  . CVA (cerebral infarction)     occipital CVA 12/13      Current Outpatient Prescriptions  Medication Sig Dispense Refill  . amiodarone (PACERONE) 400 MG tablet Take 0.5 tablets (200 mg total) by mouth 2 (two) times daily.  60 tablet  1  . aspirin 81 MG chewable tablet Chew 1 tablet (81 mg total) by mouth daily.      . baclofen (LIORESAL) 20 MG tablet Take 20 mg by mouth 2 (two) times daily.      . busPIRone (BUSPAR) 15 MG tablet Take 15 mg by mouth 2 (two) times daily. 1/2 tab bid      . calcium-vitamin D (OSCAL WITH D) 500-200 MG-UNIT per tablet Take 1 tablet by mouth 2 (two) times daily.      . ergocalciferol (VITAMIN D2) 50000 UNITS capsule Take 50,000 Units by mouth once a week.      . gabapentin (NEURONTIN) 300 MG capsule Take 300 mg by mouth 3 (three) times daily.      Marland Kitchen omeprazole (PRILOSEC) 20 MG capsule Take 20 mg by mouth daily.      Marland Kitchen warfarin (COUMADIN) 5 MG tablet Take 1 tablet (5 mg total) by mouth daily at 6  PM.  100 tablet  1   Family History  Problem Relation Age of Onset  . Lung disease Mother   . Diabetes Father   . Peripheral Artery Disease Father   . Hypertension Father    History   Social History  . Marital Status: Married    Spouse Name: N/A    Number of Children: 2  . Years of Education: N/A   Occupational History  . disabled   . Curator    Social History Main Topics  . Smoking status: Former Smoker -- 2.5 packs/day    Types: Cigarettes    Start date: 04/21/2012  . Smokeless tobacco: Not on file  . Alcohol Use: Yes     Comment: last drink 1.5 years ago, heavy drinker prior  . Drug Use: No     Comment: previous crack cocaine "quit 4 years ago"  . Sexually Active: Yes   Other Topics Concern  . Not on file   Social History Narrative  . No narrative on file   Physical Exam 47 year old white male in no acute distress General well-developed well-nourished Neurologic alert and oriented x3, no focal motor deficits, impaired visual Cardiac regular rate and rhythm normal  S1 and S2 no rubs or gallops Lungs equal breath sounds bilaterally Abdomen soft tender Extremities firm tender mildly erythematous area in the upper right thigh association with a healed incision consistent with a seroma or hematoma  Diagnostic Tests: MRI brain 06/09/12  Impression: 47 year old gentleman status post repair of a type I dissection with a right thigh seroma that appears to be secondarily infected. We attempted to drain this with a needle in the office, but were unable to completely drain the seroma. The fluid we did get consistent with old hematoma. I think the best option would be taken to the operating room for incision and drainage and probable VAC placement to definitively treat this wound. I discussed with he and his wife the indications, risks, benefits, and alternatives. They understand the risk of bleeding, infection, and recurrent seroma and agreed to proceed.  Plan: Plan  incision and drainage and possible VAC placement right thigh seroma tomorrow in the OR. We will plan to keep him overnight after the procedure.

## 2012-06-11 NOTE — Interval H&P Note (Signed)
History and Physical Interval Note:  06/11/2012 11:58 AM  Jon [name]  has presented today for surgery, with the diagnosis of right thigh seroma  The various methods of treatment have been discussed with the patient and family. After consideration of risks, benefits and other options for treatment, the patient has consented to  Procedure(s) (LRB) with comments: IRRIGATION AND DEBRIDEMENT EXTREMITY (Right) APPLICATION OF WOUND VAC (Right) as a surgical intervention .  The patient's history has been reviewed, patient examined, no change in status, stable for surgery.  I have reviewed the patient's chart and labs.  Questions were answered to the patient's satisfaction.     Bryanne Riquelme C

## 2012-06-11 NOTE — Anesthesia Preprocedure Evaluation (Addendum)
Anesthesia Evaluation  Patient identified by MRN, date of birth, ID band Patient awake    Airway Mallampati: I TM Distance: >3 FB Neck ROM: Full    Dental   Pulmonary shortness of breath, pneumonia -, resolved,          Cardiovascular + Peripheral Vascular Disease + dysrhythmias     Neuro/Psych CVA, Residual Symptoms    GI/Hepatic GERD-  Controlled,  Endo/Other    Renal/GU      Musculoskeletal   Abdominal   Peds  Hematology   Anesthesia Other Findings   Reproductive/Obstetrics                          Anesthesia Physical Anesthesia Plan  ASA: III  Anesthesia Plan: MAC   Post-op Pain Management:    Induction: Intravenous  Airway Management Planned: Natural Airway  Additional Equipment:   Intra-op Plan:   Post-operative Plan:   Informed Consent: I have reviewed the patients History and Physical, chart, labs and discussed the procedure including the risks, benefits and alternatives for the proposed anesthesia with the patient or authorized representative who has indicated his/her understanding and acceptance.     Plan Discussed with: CRNA and Surgeon  Anesthesia Plan Comments:         Anesthesia Quick Evaluation

## 2012-06-11 NOTE — Transfer of Care (Signed)
Immediate Anesthesia Transfer of Care Note  Patient: Jon [name]  Procedure(s) Performed: Procedure(s) (LRB) with comments: IRRIGATION AND DEBRIDEMENT EXTREMITY (Right) APPLICATION OF WOUND VAC (Right)  Patient Location: PACU  Anesthesia Type:MAC  Level of Consciousness: awake and patient cooperative  Airway & Oxygen Therapy: Patient Spontanous Breathing  Post-op Assessment: Report given to PACU RN, Post -op Vital signs reviewed and stable and Patient moving all extremities X 4  Post vital signs: Reviewed and stable  Complications: No apparent anesthesia complications

## 2012-06-11 NOTE — Preoperative (Signed)
Beta Blockers   Reason not to administer Beta Blockers:Not Applicable 

## 2012-06-11 NOTE — Anesthesia Procedure Notes (Addendum)
Performed by: Cathie Olden B   Procedure Name: MAC Date/Time: 06/11/2012 12:10 PM Performed by: Sherie Don Pre-anesthesia Checklist: Patient identified, Emergency Drugs available, Suction available, Patient being monitored and Timeout performed Patient Re-evaluated:Patient Re-evaluated prior to inductionOxygen Delivery Method: Simple face mask Preoxygenation: Pre-oxygenation with 100% oxygen Intubation Type: IV induction

## 2012-06-11 NOTE — Brief Op Note (Signed)
06/11/2012  1:25 PM  PATIENT:  Jon [name]  47 y.o. male  PRE-OPERATIVE DIAGNOSIS:  right thigh seroma  POST-OPERATIVE DIAGNOSIS:  right thigh hematoma  PROCEDURE:  Procedure(s) (LRB) with comments: IRRIGATION AND DEBRIDEMENT EXTREMITY (Right) APPLICATION OF WOUND VAC (Right)  SURGEON:  Surgeon(s) and Role:    * [name], MD - Primary  PHYSICIAN ASSISTANT:   ASSISTANTS: none   ANESTHESIA:   local and MAC  EBL:  Total I/O In: 500 [I.V.:500] Out: 0   BLOOD ADMINISTERED:none  DRAINS: VAC right thigh   LOCAL MEDICATIONS USED:  LIDOCAINE  and Amount: 15 ml  PLAN OF CARE: Admit for overnight observation  PATIENT DISPOSITION:  PACU - hemodynamically stable.   Delay start of Pharmacological VTE agent (>24hrs) due to surgical blood loss or risk of bleeding: not applicable

## 2012-06-12 LAB — URINALYSIS, ROUTINE W REFLEX MICROSCOPIC
Bilirubin Urine: NEGATIVE
Glucose, UA: NEGATIVE mg/dL
Hgb urine dipstick: NEGATIVE
Ketones, ur: NEGATIVE mg/dL
Protein, ur: NEGATIVE mg/dL

## 2012-06-12 MED ORDER — OXYCODONE HCL 5 MG PO TABS: 5.0000 mg | ORAL_TABLET | ORAL | Status: DC | PRN

## 2012-06-12 MED ORDER — ONDANSETRON HCL 4 MG/2ML IJ SOLN
INTRAMUSCULAR | Status: AC
  Filled 2012-06-12: qty 2

## 2012-06-12 NOTE — Care Management Note (Signed)
    Page 1 of 2   06/13/2012     3:30:08 PM   CARE MANAGEMENT NOTE 06/13/2012  Patient:  Jon [name], Jon [name]   Account Number:  [id number]  Date Initiated:  06/12/2012  Documentation initiated by:  Barbara Ahart  Subjective/Objective Assessment:   PT ADM WITH SEROMA RT THIGH.  PTA, PT LIVES AT HOME WITH SPOUSE.  HE HAS WOUND VAC TO RT THIGH WOUND AND WILL NEED HOME VAC AT DC.     Action/Plan:   PT IS UNINSURED.  WILL NEED MEDICAL CHARITY VAC THROUGH [facility].  PAPERWORK COMPLETED AND WILL FAX TO COMPANY.   Anticipated DC Date:  06/13/2012   Anticipated DC Plan:  HOME W HOME HEALTH SERVICES      DC Planning Services  CM consult      [facility] Choice  HOME HEALTH   Choice offered to / List presented to:  C-3 Spouse   DME arranged  VAC      DME agency  [facility]     HH arranged  HH-1 RN      [facility]ights agency  [facility]   Status of service:  Completed, signed off Medicare Important Message given?   (If response is "NO", the following Medicare IM given date fields will be blank) Date Medicare IM given:   Date Additional Medicare IM given:    Discharge Disposition:  HOME W HOME HEALTH SERVICES  Per UR Regulation:  Reviewed for med. necessity/level of care/duration of stay  If discussed at Long Length of Stay Meetings, dates discussed:    Comments:  06/13/12 Saunders Arlington,RN,BSN [phone] [facility] APPROVED AND FOR DELIVERY BY AROUND 4-5PM.  FAXED ORDERS, PT NOTES, AND L.O.G TO [facility]  WITH PLAN FOR START OF CARE 06/16/12.  06/12/12 Argus Caraher,RN,BSN [phone] WILL ARRANGE HH CARE WITH [facility] IN [place].  PLAN DRSG CHANGES MWF, PER MD.  Frances Furbish WILLING TO ACCEPT LOG FROM [facility] FOR HH CARE.  ESTIMATED LENGTH OF THERAPY 4WKS.

## 2012-06-12 NOTE — Progress Notes (Signed)
Patient refused evening and morning dose of heparin. Yesterday evening and morning, the patient refused his heparin. Jon [name]

## 2012-06-12 NOTE — Progress Notes (Signed)
Pt and wife have been consistently anxious today.  After napping for 2 hours wife and patient both wake up and say he has been flushed, hot, burning with urination a has had tremors all day long.  RN made aware to MD burning with urination earlier, UA was negative.  Patient has seemed very anxious all day, not able to stay still, and PRN xanax relieved symptoms.  Wife strongly believes these symptoms are something more serious but unsure of what.  Vital signs remain stable.  MD made aware.  Will continue to monitor closely. Ave Filter

## 2012-06-12 NOTE — Progress Notes (Signed)
Pt refuses IV site.  Says he is going home tomorrow and doesn't need it. Ave Filter

## 2012-06-12 NOTE — Op Note (Signed)
NAMEDAYMION, NAZAIRE NO.:  [id number]  MEDICAL RECORD NO.:  [id number]  LOCATION:  2030                         FACILITY:  [facility]  PHYSICIAN:  [name]. Dorris Fetch, M.D.DATE OF BIRTH:  1965/06/03  DATE OF PROCEDURE:  06/11/2012 DATE OF DISCHARGE:                              OPERATIVE REPORT   PREOPERATIVE DIAGNOSIS:  Right thigh seroma.  POSTOPERATIVE DIAGNOSIS:  Right thigh hematoma.  PROCEDURE:  Incision and drainage of right thigh seroma and placement of wound VAC.  SURGEON:  [name]. Dorris Fetch, M.D.  ANESTHESIA:  Local with MAC.  FINDINGS:  Induration of skin, clotted hematoma.  No evidence of infection.  CLINICAL NOTE:  Mr. 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  He has had vein harvested from his right thigh during the operation.  He had presented back to the office with a seroma and was advised to just observe this, but wished to have it aspirated.  After aspiration was performed, the patient returns for followup visit with reaccumulation of the seroma.  An attempt to aspirate this revealed very little fluid.  There was some erythema overlying this, so because of the risk of infection, it was felt prudent to do an incision and drainage in the operating room.  The indications, risks, benefits, and alternatives were discussed in detail with the patient.  He understood and accepted the risk and agreed to proceed.  OPERATIVE NOTE:  Mr. Mancinas was brought to the operating room on June 11, 2012.  There anesthesia, gave intravenous sedation and monitored the patient.  There was excellent sedation.  The right leg was prepped and draped in usual sterile fashion.  Intravenous antibiotics were administered.  The wound was anesthetized with 1% lidocaine with epinephrine, a total of 15 mL of local anesthetic was used during the procedure.  An incision was made through the previous incision.   The skin and subcutaneous tissue were indurated.  As incision was carried through the subcutaneous tissue, a clotted hematoma was encountered. Culture swabs were obtained, however, there was no purulence.  It was appeared to be pure clotted hematoma, which was very adherent to the surrounding fat. The hematoma was evacuated and the wound copiously irrigated, but removing all of the hematoma, a VAC sponge was trimmed to fit the cavity and placed in the cavity.  The covering was applied and suction was applied to the VAC.  The patient then was taken to the recovery room in good condition.  There were no complications.     [name] Dorris Fetch, M.D.     SCH/MEDQ  D:  06/11/2012  T:  06/11/2012  Job:  [id number]

## 2012-06-12 NOTE — Progress Notes (Addendum)
                   [address]            [place] [address]          [phone]      1 Day Post-Op Procedure(s) (LRB): IRRIGATION AND DEBRIDEMENT EXTREMITY (Right) APPLICATION OF WOUND VAC (Right)  Subjective: Patient without complaints  Objective: Vital signs in last 24 hours: Temp:  [97.7 F (36.5 C)-100.2 F (37.9 C)] 98.5 F (36.9 C) (02/06 0443) Pulse Rate:  [58-80] 80  (02/06 0416) Cardiac Rhythm:  [-] Normal sinus rhythm;Heart block (02/06 0810) Resp:  [9-19] 19  (02/06 0416) BP: (101-130)/(60-82) 114/60 mmHg (02/06 0416) SpO2:  [96 %-100 %] 96 % (02/06 0416)   Current Weight  06/11/12 78.8 kg (173 lb 11.6 oz)      Intake/Output from previous day: 02/05 0701 - 02/06 0700 In: 2020 [P.O.:1320; I.V.:600] Out: 2750 [Urine:2750]   Physical Exam:  Cardiovascular: RRR Pulmonary: Clear to auscultation bilaterally; no rales, wheezes, or rhonchi. Abdomen: Soft, non tender, bowel sounds present. Extremities:No lower extremity edema. Wound: VAC in place right thigh  Lab Results: CBC: Basename 06/11/12 0827  WBC 7.3  HGB 12.1*  HCT 37.0*  PLT 272   BMET:  Basename 06/11/12 0827  NA 137  K 4.3  CL 102  CO2 29  GLUCOSE 94  BUN 16  CREATININE 0.98  CALCIUM 9.3    PT/INR:  Lab Results  Component Value Date   INR 2.71* 06/12/2012   INR 3.02* 06/11/2012   INR 2.38* 05/15/2012   ABG:  INR: Will add last result for INR, ABG once components are confirmed Will add last 4 CBG results once components are confirmed  Assessment/Plan:  1. CV - SR. On Amiodarone 200 bid and Coumadin (has mechanical valve). INR decreased from 3.02 to 2.71. Coumadin 5 again tonight 2.S/p I and D of right thigh seroma-wound vac in place. 3.Will discuss discharge disposition with Dr. Gae Gallop MPA-C 06/12/2012,8:33 AM  Had nausea and vomiting this AM, also c/o burning with urination Will check UA and culture Home later today if nausea  resolves, otherwise will have to stay overnight again

## 2012-06-13 ENCOUNTER — Encounter (HOSPITAL_COMMUNITY): Payer: Self-pay | Admitting: Thoracic Surgery (Cardiothoracic Vascular Surgery)

## 2012-06-13 DIAGNOSIS — IMO0002 Reserved for concepts with insufficient information to code with codable children: Secondary | ICD-10-CM

## 2012-06-13 LAB — URINE CULTURE
Colony Count: NO GROWTH
Culture: NO GROWTH

## 2012-06-13 LAB — WOUND CULTURE: Culture: NO GROWTH

## 2012-06-13 LAB — PROTIME-INR
INR: 1.76 — ABNORMAL HIGH (ref 0.00–1.49)
Prothrombin Time: 19.9 seconds — ABNORMAL HIGH (ref 11.6–15.2)

## 2012-06-13 MED ORDER — WARFARIN SODIUM 10 MG PO TABS
12.5000 mg | ORAL_TABLET | Freq: Every day | ORAL | Status: DC
  Filled 2012-06-13: qty 1

## 2012-06-13 MED ORDER — WARFARIN SODIUM 2.5 MG PO TABS: 12.5000 mg | ORAL_TABLET | Freq: Every day | ORAL | Status: DC

## 2012-06-13 NOTE — Discharge Summary (Signed)
Physician Discharge Summary  Patient ID: Jon [name] MRN: [id number] DOB/AGE: 1965/12/24 47 y.o.  Admit date: 06/11/2012 Discharge date: 06/13/2012  Admission Diagnoses:  Patient Active Problem List  Diagnosis  . HCAP (healthcare-associated pneumonia)  . Syncope  . Anxiety  . Aortic stenosis  . Aortic aneurysm and dissection  . Thoracic aortic aneurysm  . Stroke, embolic  . CVA (cerebral infarction)   Discharge Diagnoses:   Patient Active Problem List  Diagnosis  . HCAP (healthcare-associated pneumonia)  . Syncope  . Anxiety  . Aortic stenosis  . Aortic aneurysm and dissection  . Thoracic aortic aneurysm  . Stroke, embolic  . CVA (cerebral infarction)  . Seroma, postoperative   Discharged Condition: good  History of Present Illness:   Jon [name] is well known to [facility].  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  The patient also underwent open harvest of his right saphenous vein.  Since the surgery he has had problems with recurrent seroma development in the RLE.  Aspiration removed a small amount of fluid.  However the patient presented to his PCP with a complaint of redness around the area and increased tenderness and he was prescribed an antibiotic.  He presented for follow up with Jon [name] on 06/10/2012 at which time it was felt the seroma was secondarily infected.  Needle Aspiration was attempted, however this did not remove all of the fluid present.  Due to this it was felt the patient's best treatment option would be surgical debridement with wound vac placement.  The risks and benefits of the procedure were explained to the patient and he was agreeable to proceed.    Hospital Course:   Jon [name] presented to [facility] on 06[facility]11/2012.  He was taken to the operating room and underwent Incision and Drainage of the right thigh seroma with placement of a wound vac.  He tolerated the procedure well and was  taken to the PACU in stable condition.  Wound cultures obtained in the operating room have been negative.  Patient has complained of dysuria during this hospitalization.  Urinalysis was obtained and was negative for UTI.  The patient has done well.  Home health care has been arranged and wound vac therapy has been approved.  His wound vac will be changed today prior to discharge home and he will continue changes every MWF.  He is to resume his home regimen of Coumadin 12.5 daily with goal INR of 2.0-3.0.  He may resume his home physical therapy regimen.        Treatments: surgery:   Incision and drainage of right thigh seroma and placement of  wound VAC.  Disposition: 01-Home or Self Care     Medication List     As of 06/13/2012  9:41 AM    STOP taking these medications         sulfamethoxazole-trimethoprim 800-160 MG per tablet   Commonly known as: BACTRIM DS,SEPTRA DS      TAKE these medications         amiodarone 400 MG tablet   Commonly known as: PACERONE   Take 0.5 tablets (200 mg total) by mouth 2 (two) times daily.      aspirin 81 MG chewable tablet   Chew 1 tablet (81 mg total) by mouth daily.      baclofen 20 MG tablet   Commonly known as: LIORESAL   Take 20 mg by mouth 2 (two) times daily.  busPIRone 15 MG tablet   Commonly known as: BUSPAR   Take 7.5 mg by mouth 2 (two) times daily.      calcium-vitamin D 500-200 MG-UNIT per tablet   Commonly known as: OSCAL WITH D   Take 1 tablet by mouth 2 (two) times daily.      ergocalciferol 50000 UNITS capsule   Commonly known as: VITAMIN D2   Take 50,000 Units by mouth once a week.      gabapentin 300 MG capsule   Commonly known as: NEURONTIN   Take 300 mg by mouth 3 (three) times daily.      levothyroxine 25 MCG tablet   Commonly known as: SYNTHROID, LEVOTHROID   Take 25 mcg by mouth daily.      omeprazole 20 MG capsule   Commonly known as: PRILOSEC   Take 20 mg by mouth daily.      oxyCODONE 5 MG  immediate release tablet   Commonly known as: Oxy IR/ROXICODONE   Take 1-2 tablets (5-10 mg total) by mouth every 3 (three) hours as needed for pain.      warfarin 5 MG tablet   Commonly known as: COUMADIN   Take 1 tablet (5 mg total) by mouth daily at 6 PM.         Signed: Ebunoluwa [name] 06/13/2012, 9:41 AM

## 2012-06-13 NOTE — Consult Note (Signed)
WOC consult Note Reason for Consult: Requested to apply vac to right groin wound. Wound type: Full thickness post-op wound Measurement:6X2X1cm Wound bed:100% beefy red Drainage (amount, consistency, odor) mod red drainage Periwound: Intact skin surrounding Dressing procedure/placement/frequency: One piece black sponge applied to cont suction.  Pt tolerated with mod discomfort.  Plans to D/C today when home vac machine delivered.   Cammie Mcgee, RN, MSN, [employer]  [phone]

## 2012-06-13 NOTE — Progress Notes (Addendum)
2 Days Post-Op Procedure(s) (LRB): IRRIGATION AND DEBRIDEMENT EXTREMITY (Right) APPLICATION OF WOUND VAC (Right) Subjective:  [name] has no new complaints this morning.   Objective: Vital signs in last 24 hours: Temp:  [98.4 F (36.9 C)-98.9 F (37.2 C)] 98.9 F (37.2 C) (02/07 0357) Pulse Rate:  [60-67] 60  (02/07 0357) Cardiac Rhythm:  [-] Normal sinus rhythm;Heart block (02/07 0755) Resp:  [18] 18  (02/07 0357) BP: (101-110)/(60-69) 109/69 mmHg (02/07 0357) SpO2:  [95 %-99 %] 95 % (02/07 0357)  Intake/Output from previous day: 02/06 0701 - 02/07 0700 In: 1080 [P.O.:1080] Out: 2175 [Urine:2000; Drains:175]  General appearance: alert, cooperative and no distress Heart: regular rate and rhythm Lungs: clear to auscultation bilaterally Abdomen: soft, non-tender; bowel sounds normal; no masses,  no organomegaly Wound: wound vac in place  Lab Results:  [facility] 06/11/12 0827  WBC 7.3  HGB 12.1*  HCT 37.0*  PLT 272   BMET:  Basename 06/11/12 0827  NA 137  K 4.3  CL 102  CO2 29  GLUCOSE 94  BUN 16  CREATININE 0.98  CALCIUM 9.3    PT/INR:  Basename 06/12/12 0451  LABPROT 27.4*  INR 2.71*   ABG    Component Value Date/Time   PHART 7.378 04/23/2012 1111   HCO3 26.5* 04/23/2012 1111   TCO2 27 04/23/2012 1728   ACIDBASEDEF 6.0* 04/22/2012 1218   O2SAT 93.0 04/23/2012 1111   CBG (last 3)  No results found for this basename: GLUCAP:3 in the last 72 hours  Assessment/Plan: S/P Procedure(s) (LRB): IRRIGATION AND DEBRIDEMENT EXTREMITY (Right) APPLICATION OF WOUND VAC (Right)  1. CV- NSR on Amiodarone and Coumadin 2. INR not drawn this morning- will order adjust dose if needed 3. S/P I/D right thigh- wound vac in place- change today 4. Dispo- d/c pending home health arrangements   LOS: 2 days    Raford Pitcher, ERIN 06/13/2012   patient seen and examined. Agree with above  INR down to 1.7- he says he was on 12.5 mg coumadin at home, will resume that  dose  Wound looks good- no erythema of the skin around the wound  Home when arrangements finalized  He may resume PT after discharge

## 2012-06-16 ENCOUNTER — Ambulatory Visit (INDEPENDENT_AMBULATORY_CARE_PROVIDER_SITE_OTHER): Payer: Self-pay | Admitting: *Deleted

## 2012-06-16 DIAGNOSIS — IMO0001 Reserved for inherently not codable concepts without codable children: Secondary | ICD-10-CM

## 2012-06-16 DIAGNOSIS — IMO0002 Reserved for concepts with insufficient information to code with codable children: Secondary | ICD-10-CM

## 2012-06-16 DIAGNOSIS — J860 Pyothorax with fistula: Secondary | ICD-10-CM

## 2012-06-16 LAB — ANAEROBIC CULTURE

## 2012-06-16 NOTE — Progress Notes (Signed)
Jon [name] returns for a wound VAC change to his right upper thigh I and D site of a previous seroma.  The area is clean and shows good granulation,red and beefy, tissue.  He tolerated the procedure well.  He will return Wednesday for same if [facility] has not been arranged.

## 2012-06-16 NOTE — Progress Notes (Signed)
[facility] pulled out of agreement to provided home care for patient on Friday, as they did not agree to terms of Letter of Guarantee for [facility] care provided by [facility].  Patient to [facility] office today 2/10  for wound Vac dressing change.   Arranged [facility] with [employer], [address], [place], [place] [address]; phone [phone].  Confirmed with Tamala Bari, liasion for [facility]; Letter of Guarantee provided.  Start of care 06/18/12. Notified pt's wife of home health provider, and gave contact information.

## 2012-06-17 ENCOUNTER — Ambulatory Visit: Payer: Self-pay | Admitting: Thoracic Surgery (Cardiothoracic Vascular Surgery)

## 2012-06-24 ENCOUNTER — Encounter: Payer: Self-pay | Admitting: Thoracic Surgery (Cardiothoracic Vascular Surgery)

## 2012-07-08 ENCOUNTER — Emergency Department (HOSPITAL_COMMUNITY): Payer: Medicaid Other

## 2012-07-08 ENCOUNTER — Emergency Department (HOSPITAL_COMMUNITY)
Admission: EM | Admit: 2012-07-08 | Discharge: 2012-07-08 | Disposition: A | Discharge status: Home / Self Care | Payer: Medicaid Other | Attending: Emergency Medicine | Admitting: Emergency Medicine

## 2012-07-08 ENCOUNTER — Encounter (HOSPITAL_COMMUNITY): Payer: Self-pay | Admitting: Emergency Medicine

## 2012-07-08 DIAGNOSIS — Z8673 Personal history of transient ischemic attack (TIA), and cerebral infarction without residual deficits: Secondary | ICD-10-CM | POA: Insufficient documentation

## 2012-07-08 DIAGNOSIS — Z87891 Personal history of nicotine dependence: Secondary | ICD-10-CM | POA: Insufficient documentation

## 2012-07-08 DIAGNOSIS — M542 Cervicalgia: Secondary | ICD-10-CM | POA: Insufficient documentation

## 2012-07-08 DIAGNOSIS — Z7982 Long term (current) use of aspirin: Secondary | ICD-10-CM | POA: Insufficient documentation

## 2012-07-08 DIAGNOSIS — Z7901 Long term (current) use of anticoagulants: Secondary | ICD-10-CM | POA: Insufficient documentation

## 2012-07-08 DIAGNOSIS — K219 Gastro-esophageal reflux disease without esophagitis: Secondary | ICD-10-CM | POA: Insufficient documentation

## 2012-07-08 DIAGNOSIS — E039 Hypothyroidism, unspecified: Secondary | ICD-10-CM | POA: Insufficient documentation

## 2012-07-08 DIAGNOSIS — F411 Generalized anxiety disorder: Secondary | ICD-10-CM | POA: Insufficient documentation

## 2012-07-08 DIAGNOSIS — Z8679 Personal history of other diseases of the circulatory system: Secondary | ICD-10-CM | POA: Insufficient documentation

## 2012-07-08 DIAGNOSIS — R079 Chest pain, unspecified: Secondary | ICD-10-CM | POA: Insufficient documentation

## 2012-07-08 DIAGNOSIS — Z79899 Other long term (current) drug therapy: Secondary | ICD-10-CM | POA: Insufficient documentation

## 2012-07-08 DIAGNOSIS — Z951 Presence of aortocoronary bypass graft: Secondary | ICD-10-CM | POA: Insufficient documentation

## 2012-07-08 DIAGNOSIS — Z9861 Coronary angioplasty status: Secondary | ICD-10-CM | POA: Insufficient documentation

## 2012-07-08 DIAGNOSIS — Z8774 Personal history of (corrected) congenital malformations of heart and circulatory system: Secondary | ICD-10-CM | POA: Insufficient documentation

## 2012-07-08 LAB — CBC WITH DIFFERENTIAL/PLATELET
Basophils Relative: 1 % (ref 0–1)
Eosinophils Absolute: 0.3 10*3/uL (ref 0.0–0.7)
Eosinophils Relative: 3 % (ref 0–5)
Hemoglobin: 13.2 g/dL (ref 13.0–17.0)
Lymphs Abs: 1.6 10*3/uL (ref 0.7–4.0)
MCH: 30.1 pg (ref 26.0–34.0)
MCHC: 34.1 g/dL (ref 30.0–36.0)
MCV: 88.2 fL (ref 78.0–100.0)
Monocytes Relative: 6 % (ref 3–12)
Neutrophils Relative %: 76 % (ref 43–77)
RBC: 4.39 MIL/uL (ref 4.22–5.81)

## 2012-07-08 LAB — COMPREHENSIVE METABOLIC PANEL
Albumin: 4.3 g/dL (ref 3.5–5.2)
BUN: 12 mg/dL (ref 6–23)
Calcium: 9.4 mg/dL (ref 8.4–10.5)
Creatinine, Ser: 0.85 mg/dL (ref 0.50–1.35)
GFR calc Af Amer: >90 mL/min (ref >90)
Glucose, Bld: 83 mg/dL (ref 70–99)
Total Protein: 7.6 g/dL (ref 6.0–8.3)

## 2012-07-08 LAB — PROTIME-INR
INR: 2.01 — ABNORMAL HIGH (ref 0.00–1.49)
Prothrombin Time: 22 seconds — ABNORMAL HIGH (ref 11.6–15.2)

## 2012-07-08 LAB — POCT I-STAT TROPONIN I: Troponin i, poc: 0.02 ng/mL (ref 0.00–0.08)

## 2012-07-08 MED ORDER — IOHEXOL 350 MG/ML SOLN
100.0000 mL | Freq: Once | INTRAVENOUS | Status: AC | PRN
  Administered 2012-07-08: 100 mL via INTRAVENOUS

## 2012-07-08 NOTE — ED Provider Notes (Signed)
History     CSN: [id number]  Arrival date & time 07/08/12  1535   First MD Initiated Contact with Patient 07/08/12 1547      Chief Complaint  Patient presents with  . Chest Pain    (Consider location/radiation/quality/duration/timing/severity/associated sxs/prior treatment) Patient is a 47 y.o. male presenting with chest pain. The history is provided by the patient and the spouse.  Chest Pain  patient complaining of left-sided neck pain x2 weeks characterized as sharp and intermittent. Patient also notes left-sided chest pain x5 days. Was seen at [facility] for same 4 days ago and according to him had a negative workup consisting of cardiac enzymes and chest x-ray. He was noted at that time to have an INR 5.4. He continues to note the chest pain was last for seconds and is not associated with dyspnea, diaphoresis, nausea or vomiting. Nothing makes the pain better or worse. Pain is characterized as sharp. Patient denies an exertional component to his chest pain. Patient denies any fever or cough.  Patient's left-sided neck pain is characterized as sharp and not radicular. Denies any rashes. Pain is not worse with movement. Patient denies any strokelike symptoms but had a persistent headache. He denies any slurred speech or facial droop. Patient states he has a history of carotid artery narrowing.  Past Medical History  Diagnosis Date  . Bicuspid aortic valve   . Aortic stenosis     mechanical AVR 04/22/12  . Shortness of breath   . Thoracic aortic aneurysm     replacement aortic root 04/22/12  . CVA (cerebral infarction)     occipital CVA 12/13  . Stroke     x3  . GERD (gastroesophageal reflux disease)   . Headache     "due to vision problem"  . Dysrhythmia     sees Dr. Geronimo Running, [facility] cardiology in [place]  . Anxiety     sees Dr. Renae Fickle  . Hypothyroidism     Past Surgical History  Procedure Laterality Date  . Cardiac catheterization    . Rotator cuff  repair      12 years ago, Left  . Thoracic aortic aneurysm repair  04/22/2012    Procedure: THORACIC ASCENDING ANEURYSM REPAIR (AAA);  Surgeon: [name], MD;  [facility] OR;  Service: Open Heart Surgery;  Laterality: N/A;  . Coronary artery bypass graft  04/22/2012    Procedure: CORONARY ARTERY BYPASS GRAFTING (CABG);  Surgeon: [name], MD;  Location: [facility] OR;  Service: Open Heart Surgery;  Laterality: N/A;  times one with right greater saphenous vein graft  . Intraoperative transesophageal echocardiogram  04/22/2012    Procedure: INTRAOPERATIVE TRANSESOPHAGEAL ECHOCARDIOGRAM;  Surgeon: [name], MD;  Location: [facility] OR;  Service: Open Heart Surgery;  Laterality: N/A;  . I&d extremity  06/11/2012    Procedure: IRRIGATION AND DEBRIDEMENT EXTREMITY;  Surgeon: [name], MD;  Location: [facility] OR;  Service: Vascular;  Laterality: Right;  . Application of wound vac  06/11/2012    Procedure: APPLICATION OF WOUND VAC;  Surgeon: [name], MD;  Location: [facility] OR;  Service: Vascular;  Laterality: Right;    Family History  Problem Relation Age of Onset  . Lung disease Mother   . Diabetes Father   . Peripheral Artery Disease Father   . Hypertension Father     History  Substance Use Topics  . Smoking status: Former Smoker -- 2.50 packs/day    Types: Cigarettes    Start  date: 04/21/2012    Quit date: 04/21/2012  . Smokeless tobacco: Not on file  . Alcohol Use: Yes     Comment: last drink 1.5 years ago, heavy drinker prior      Review of Systems  Cardiovascular: Positive for chest pain.  All other systems reviewed and are negative.    Allergies  Penicillins and Dilaudid  Home Medications   Current Outpatient Rx  Name  Route  Sig  Dispense  Refill  . aspirin 81 MG chewable tablet   Oral   Chew 1 tablet (81 mg total) by mouth daily.         . baclofen (LIORESAL) 20 MG tablet   Oral   Take 20 mg by mouth 2 (two) times daily.          . busPIRone (BUSPAR) 15 MG tablet   Oral   Take 7.5 mg by mouth 2 (two) times daily.          . calcium-vitamin D (OSCAL WITH D) 500-200 MG-UNIT per tablet   Oral   Take 1 tablet by mouth 2 (two) times daily.         . ergocalciferol (VITAMIN D2) 50000 UNITS capsule   Oral   Take 50,000 Units by mouth once a week.         . gabapentin (NEURONTIN) 300 MG capsule   Oral   Take 300 mg by mouth 3 (three) times daily.         Marland Kitchen levothyroxine (SYNTHROID, LEVOTHROID) 25 MCG tablet   Oral   Take 25 mcg by mouth daily.         Marland Kitchen omeprazole (PRILOSEC) 20 MG capsule   Oral   Take 20 mg by mouth daily.         Marland Kitchen oxyCODONE (OXY IR/ROXICODONE) 5 MG immediate release tablet   Oral   Take 1-2 tablets (5-10 mg total) by mouth every 3 (three) hours as needed for pain.   30 tablet   0   . warfarin (COUMADIN) 2.5 MG tablet   Oral   Take 5 tablets (12.5 mg total) by mouth daily at 6 PM.           BP 127/85  Pulse 60  Temp(Src) 97.8 F (36.6 C) (Oral)  Resp 19  SpO2 97%  Physical Exam  Nursing note and vitals reviewed. Constitutional: He is oriented to person, place, and time. He appears well-developed and well-nourished.  Non-toxic appearance. No distress.  HENT:  Head: Normocephalic and atraumatic.  Eyes: Conjunctivae, EOM and lids are normal. Pupils are equal, round, and reactive to light.  Neck: Normal range of motion. Neck supple. No tracheal deviation present. No mass present.  Cardiovascular: Normal rate, regular rhythm and normal heart sounds.  Exam reveals no gallop.   No murmur heard. Pulmonary/Chest: Effort normal and breath sounds normal. No stridor. No respiratory distress. He has no decreased breath sounds. He has no wheezes. He has no rhonchi. He has no rales.  Abdominal: Soft. Normal appearance and bowel sounds are normal. He exhibits no distension. There is no tenderness. There is no rebound and no CVA tenderness.  Musculoskeletal: Normal range of  motion. He exhibits no edema and no tenderness.  Neurological: He is alert and oriented to person, place, and time. He displays no tremor. No cranial nerve deficit or sensory deficit. He displays no seizure activity. GCS eye subscore is 4. GCS verbal subscore is 5. GCS motor subscore is 6.  Skin: Skin is  warm and dry. No abrasion and no rash noted.  Psychiatric: His affect is blunt. His speech is delayed. He is slowed.    ED Course  Procedures (including critical care time)  Labs Reviewed  CBC WITH DIFFERENTIAL - Abnormal; Notable for the following:    HCT 38.7 (*)    Neutro Abs 8.0 (*)    All other components within normal limits  COMPREHENSIVE METABOLIC PANEL  PROTIME-INR  POCT I-STAT TROPONIN I   No results found.   No diagnosis found.    MDM   Date: 07/08/2012  Rate: 60  Rhythm: normal sinus rhythm  QRS Axis: normal  Intervals: normal  ST/T Wave abnormalities: nonspecific ST changes  Conduction Disutrbances:first-degree A-V block   Narrative Interpretation:   Old EKG Reviewed: none available   7:46 PM Patient's x-rays reviewed and are without acute findings at this time. Suspect that patient's chest pain is from postoperative healing. He has ACS. No signs of new dissection or PE. He is stable for discharge       Toy Baker, MD 07/08/12 1946

## 2012-07-08 NOTE — ED Notes (Signed)
Patient transported to CT 

## 2012-07-08 NOTE — ED Notes (Signed)
Pt remains in CT Scan.

## 2012-07-08 NOTE — ED Notes (Signed)
Dr Freida Busman at bedside to speak with family.

## 2012-07-08 NOTE — ED Notes (Signed)
Pulse rate 52 radial.

## 2012-07-08 NOTE — ED Notes (Signed)
Pt c/o left chest pain onset 6 days ago.  Was seen at [facility] on Fri and had neg. Blood work and EKG.  Wife st's everything was normal prior to his aortic aneurysm on 04/21/12.  Pt also c/o left neck pain x's 2 weeks.

## 2012-08-01 ENCOUNTER — Other Ambulatory Visit: Payer: Self-pay | Admitting: Surgical

## 2012-10-15 DIAGNOSIS — H53489 Generalized contraction of visual field, unspecified eye: Secondary | ICD-10-CM

## 2012-10-15 HISTORY — DX: Generalized contraction of visual field, unspecified eye: H53.489

## 2013-07-14 DIAGNOSIS — G609 Hereditary and idiopathic neuropathy, unspecified: Secondary | ICD-10-CM

## 2013-07-14 DIAGNOSIS — I69920 Aphasia following unspecified cerebrovascular disease: Secondary | ICD-10-CM

## 2013-07-14 DIAGNOSIS — I6789 Other cerebrovascular disease: Secondary | ICD-10-CM

## 2013-07-14 DIAGNOSIS — I6330 Cerebral infarction due to thrombosis of unspecified cerebral artery: Secondary | ICD-10-CM

## 2013-07-14 HISTORY — DX: Other cerebrovascular disease: I67.89

## 2013-07-14 HISTORY — DX: Hereditary and idiopathic neuropathy, unspecified: G60.9

## 2013-07-14 HISTORY — DX: Cerebral infarction due to thrombosis of unspecified cerebral artery: I63.30

## 2013-07-14 HISTORY — DX: Aphasia following unspecified cerebrovascular disease: I69.920

## 2013-11-12 ENCOUNTER — Encounter: Payer: Self-pay | Admitting: Interventional Cardiology

## 2014-05-08 IMAGING — CR DG CHEST 1V PORT
1 series · 1 of 1 positions shown · non-contrast
Comparison: Single view of the chest 04/21/2012.

CLINICAL DATA: Status post CABG.

PORTABLE CHEST - 1 VIEW

[AP]
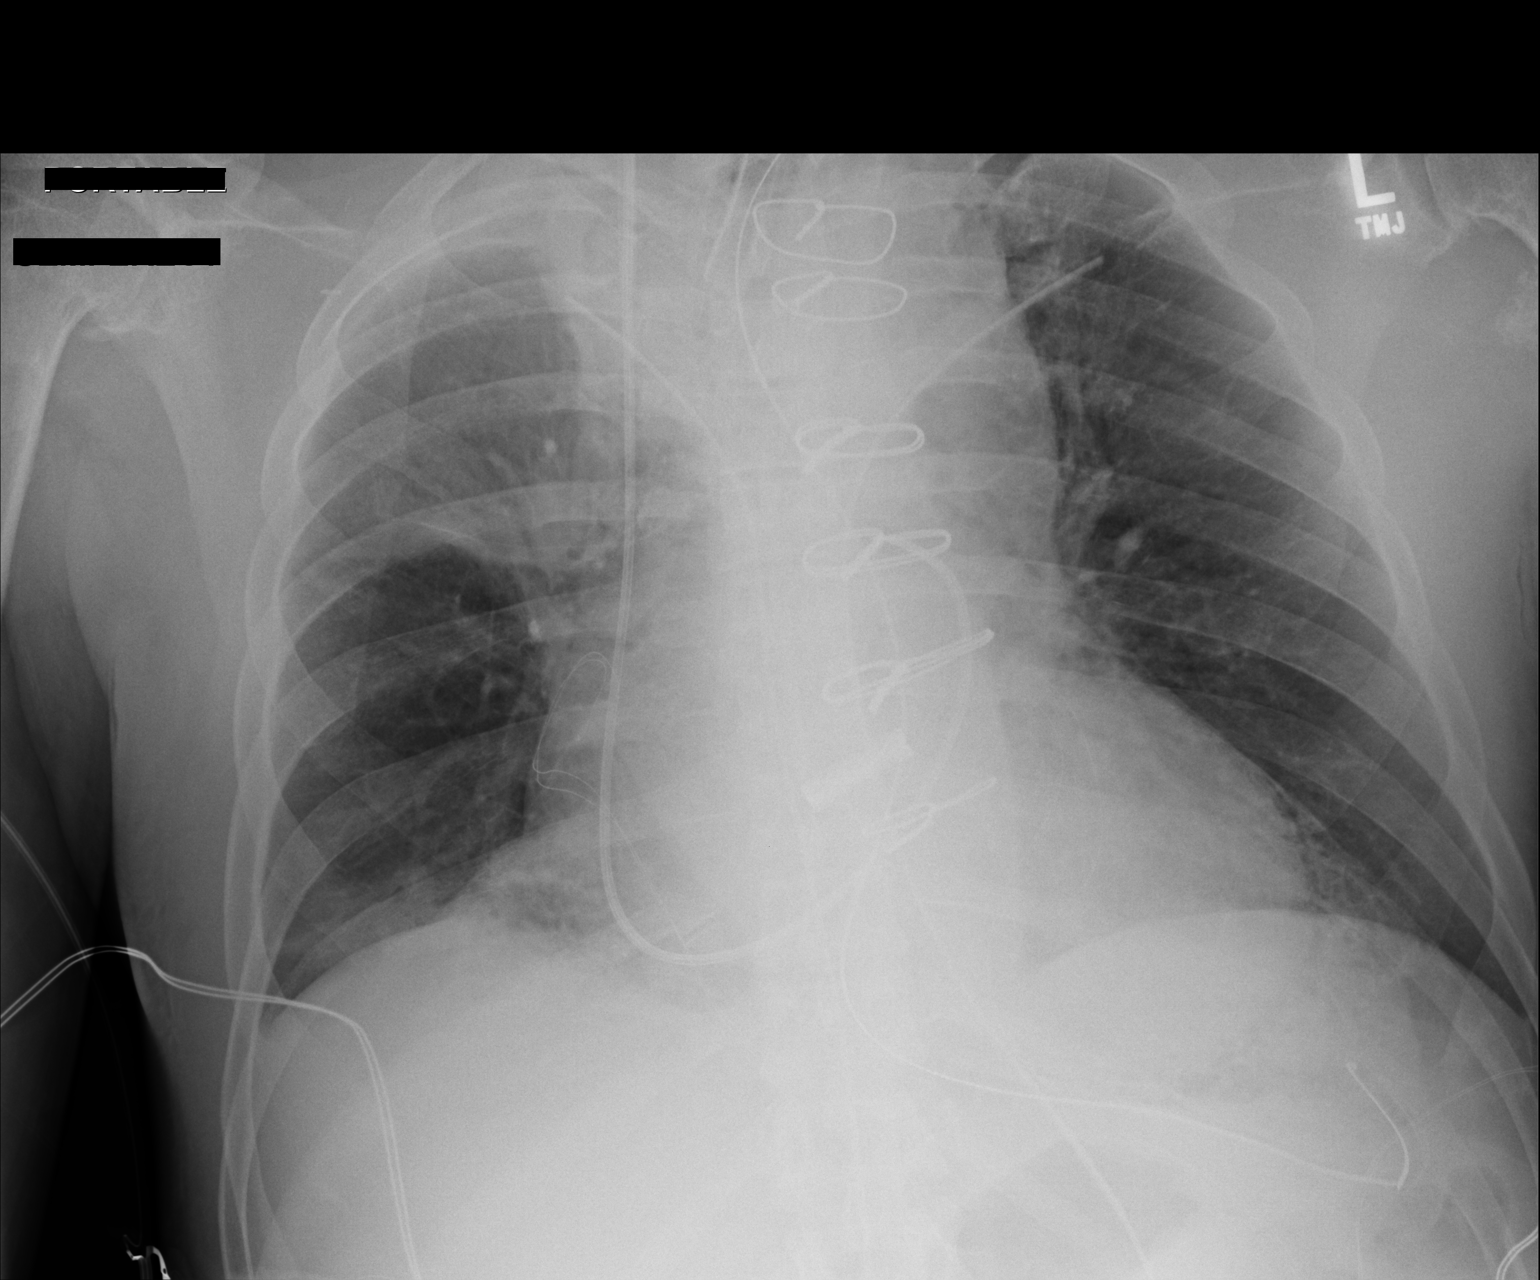

[1 of 1 positions shown; findings below may reference images not displayed]

FINDINGS: Endotracheal tube is in place with the tip in good
position, well above the carina.  Right IJ approach Swan-Ganz
catheter tip is in the proximal most right main pulmonary artery.
Chest tubes are noted. There is a small right pleural effusion.  No
pneumothorax is seen.  Atelectasis right midlung noted.
IMPRESSION: Support apparatus in good position.  No pneumothorax.  Small right
effusion and scattered atelectasis.

## 2014-05-11 IMAGING — CT CT HEAD W/O CM
1 of 2 series · 13 of 30 positions shown, 17 images · non-contrast
Comparison: CT head 07/07/2003

CLINICAL DATA: Acute frontal headache.  Aortic dissection.  Postop
dissection repair

CT HEAD WITHOUT CONTRAST
TECHNIQUE: Contiguous axial images were obtained from the base of
the skull through the vertex without contrast.

[Series 2: brain · axial · 0.47mm/px · z∈[+156,+278]mm · 13 of 28 slices shown, 17 images]
[im 2/28  brain]
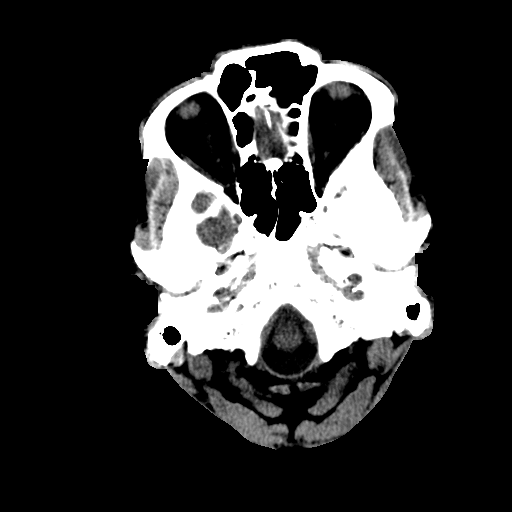
[im 2/28  bone]
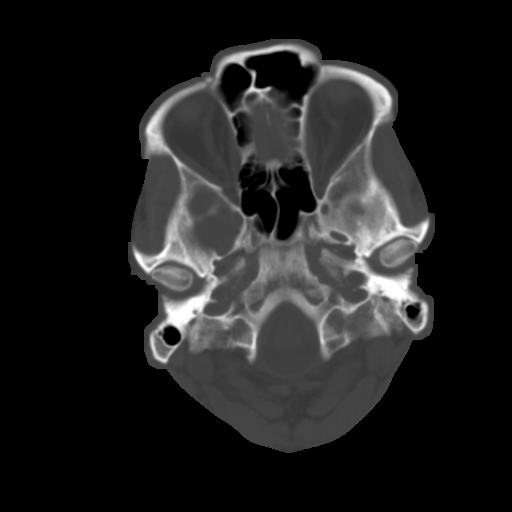
[im 4/28  brain]
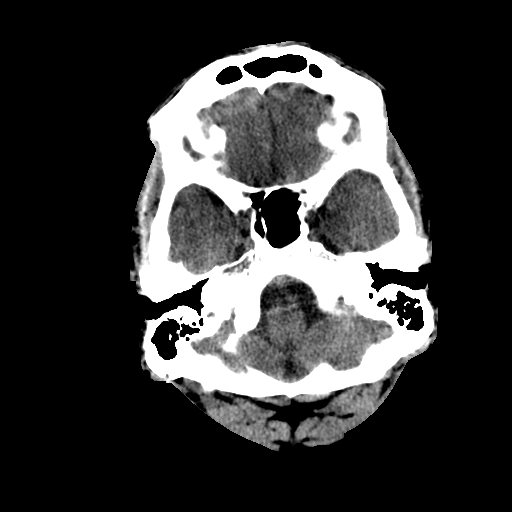
[im 6/28  brain]
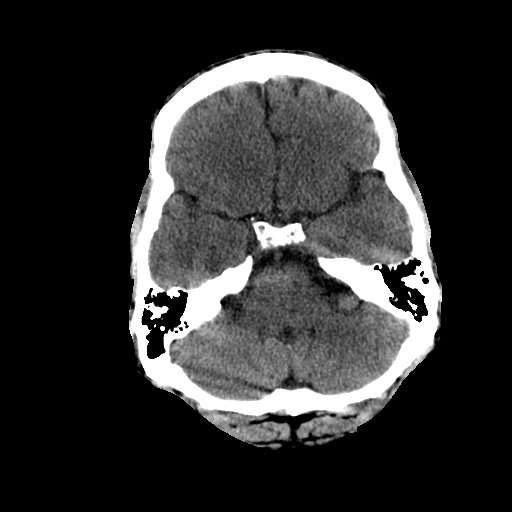
[im 8/28  brain]
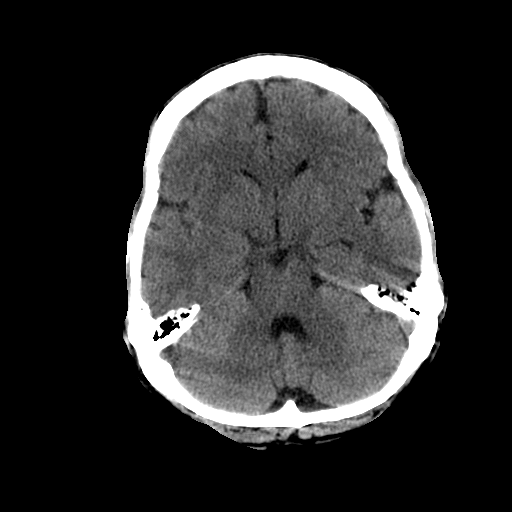
[im 10/28  brain]
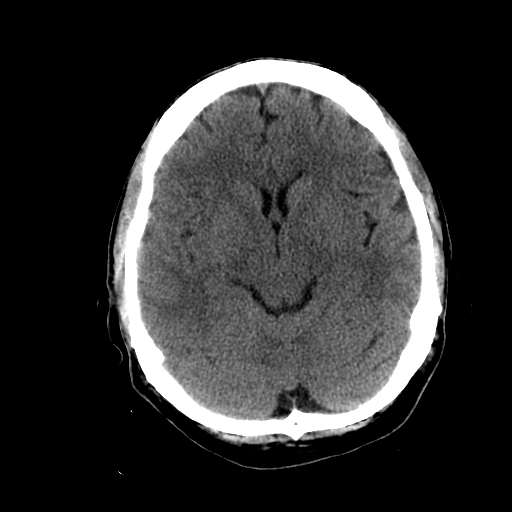
[im 10/28  bone]
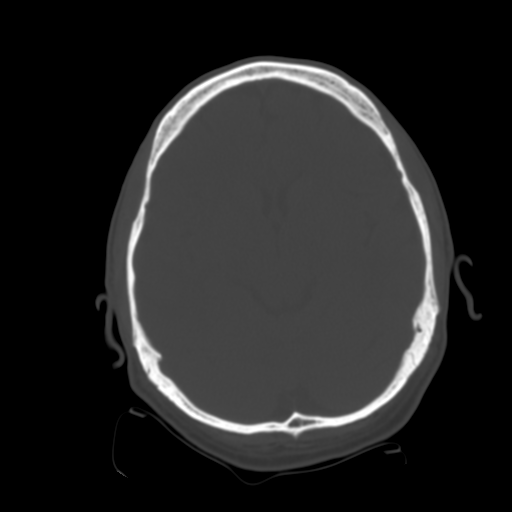
[im 12/28  brain]
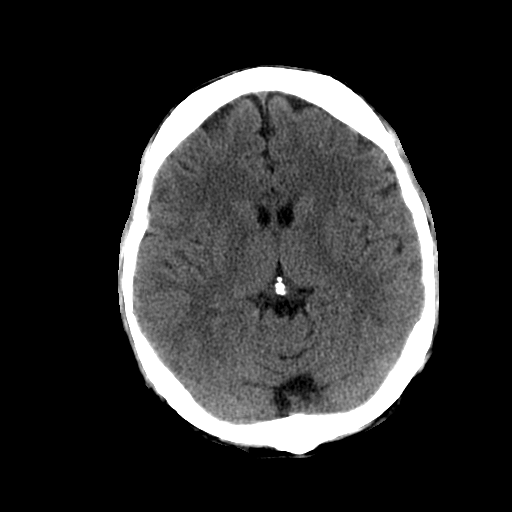
[im 14/28  brain]
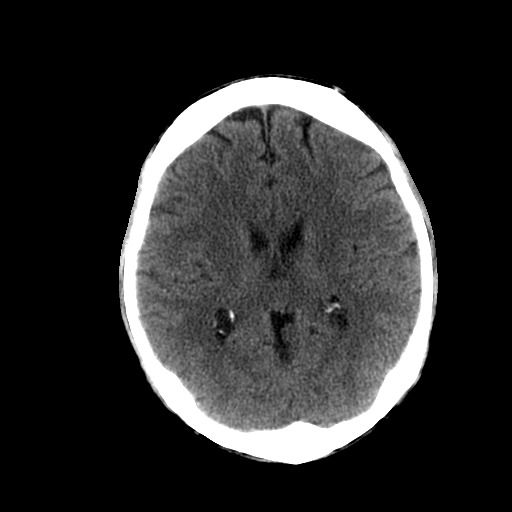
[im 16/28  brain]
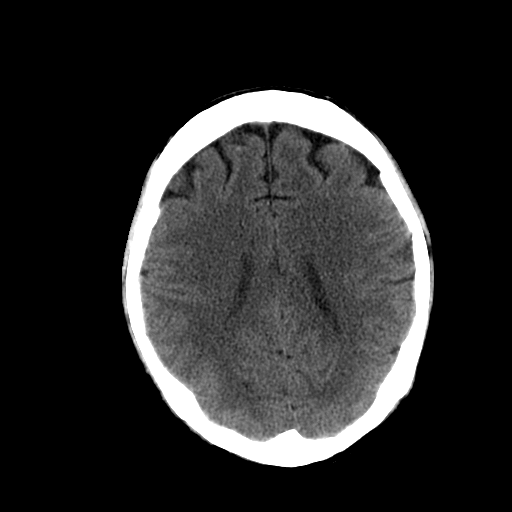
[im 18/28  brain]
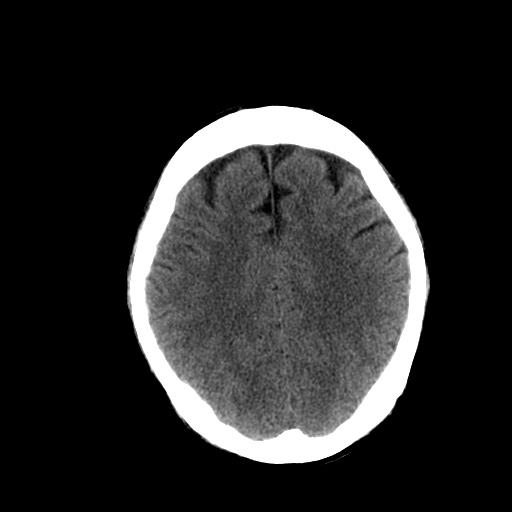
[im 18/28  bone]
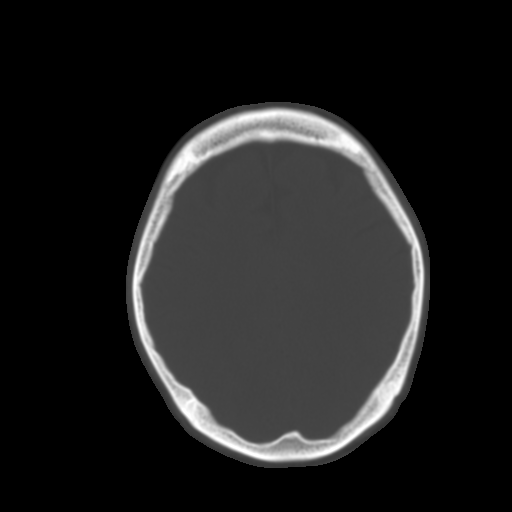
[im 20/28  brain]
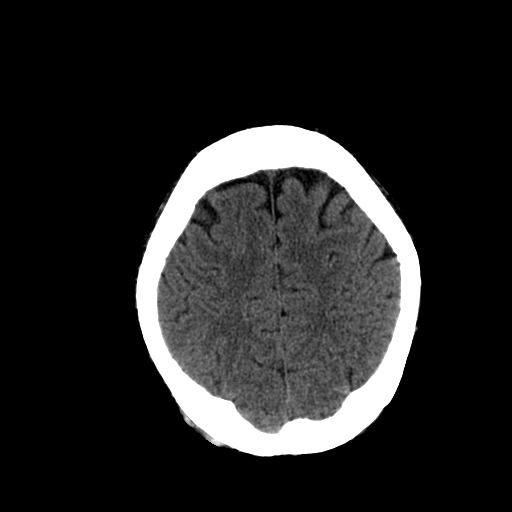
[im 22/28  brain]
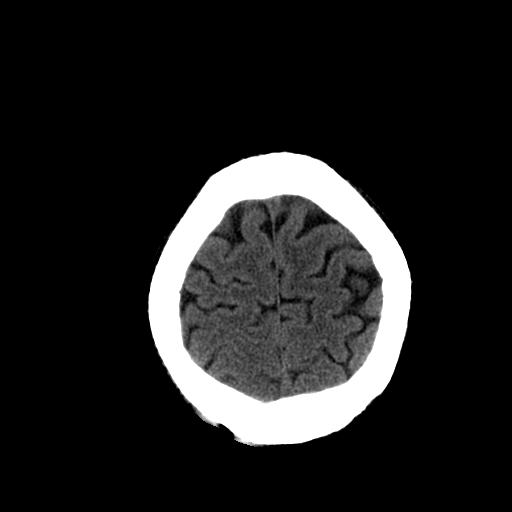
[im 24/28  brain]
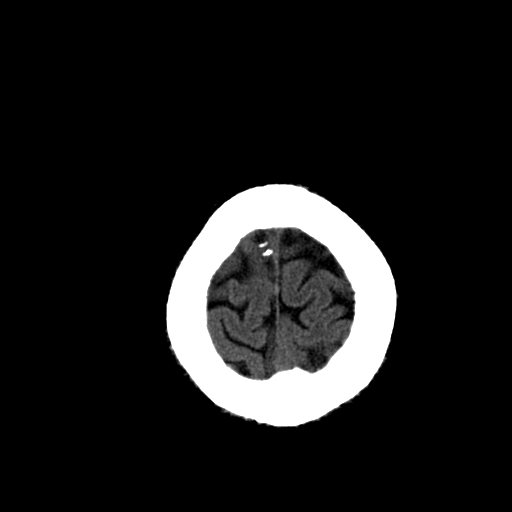
[im 26/28  brain]
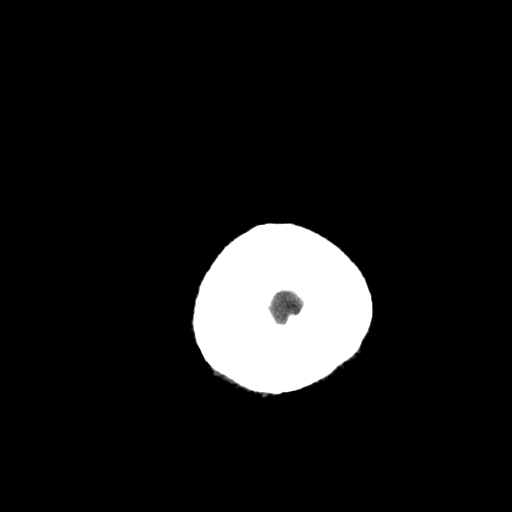
[im 26/28  bone]
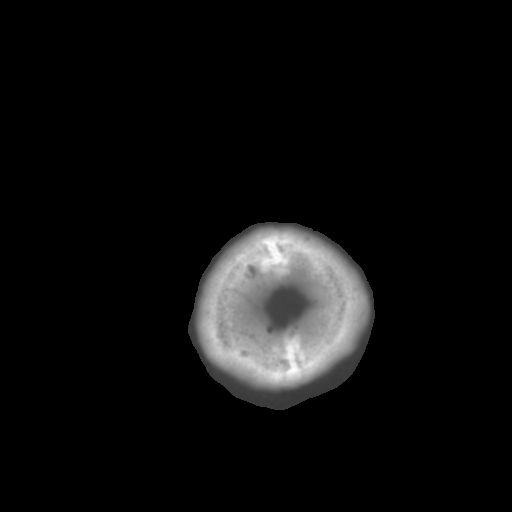

[13 of 30 positions shown; findings below may reference images not displayed]

FINDINGS: Ventricle size is normal.  No acute infarct.  Negative
for hemorrhage or mass.  No edema or midline shift.

Calvarium is intact.  Focal area of skin thickening right parietal
scalp is unchanged from 2663.
IMPRESSION: No acute intracranial abnormality.

## 2014-05-13 IMAGING — CR DG CHEST 1V PORT
1 series · 1 of 1 positions shown · non-contrast
Comparison: 04/25/2012

CLINICAL DATA: Preop

PORTABLE CHEST - 1 VIEW

[AP]
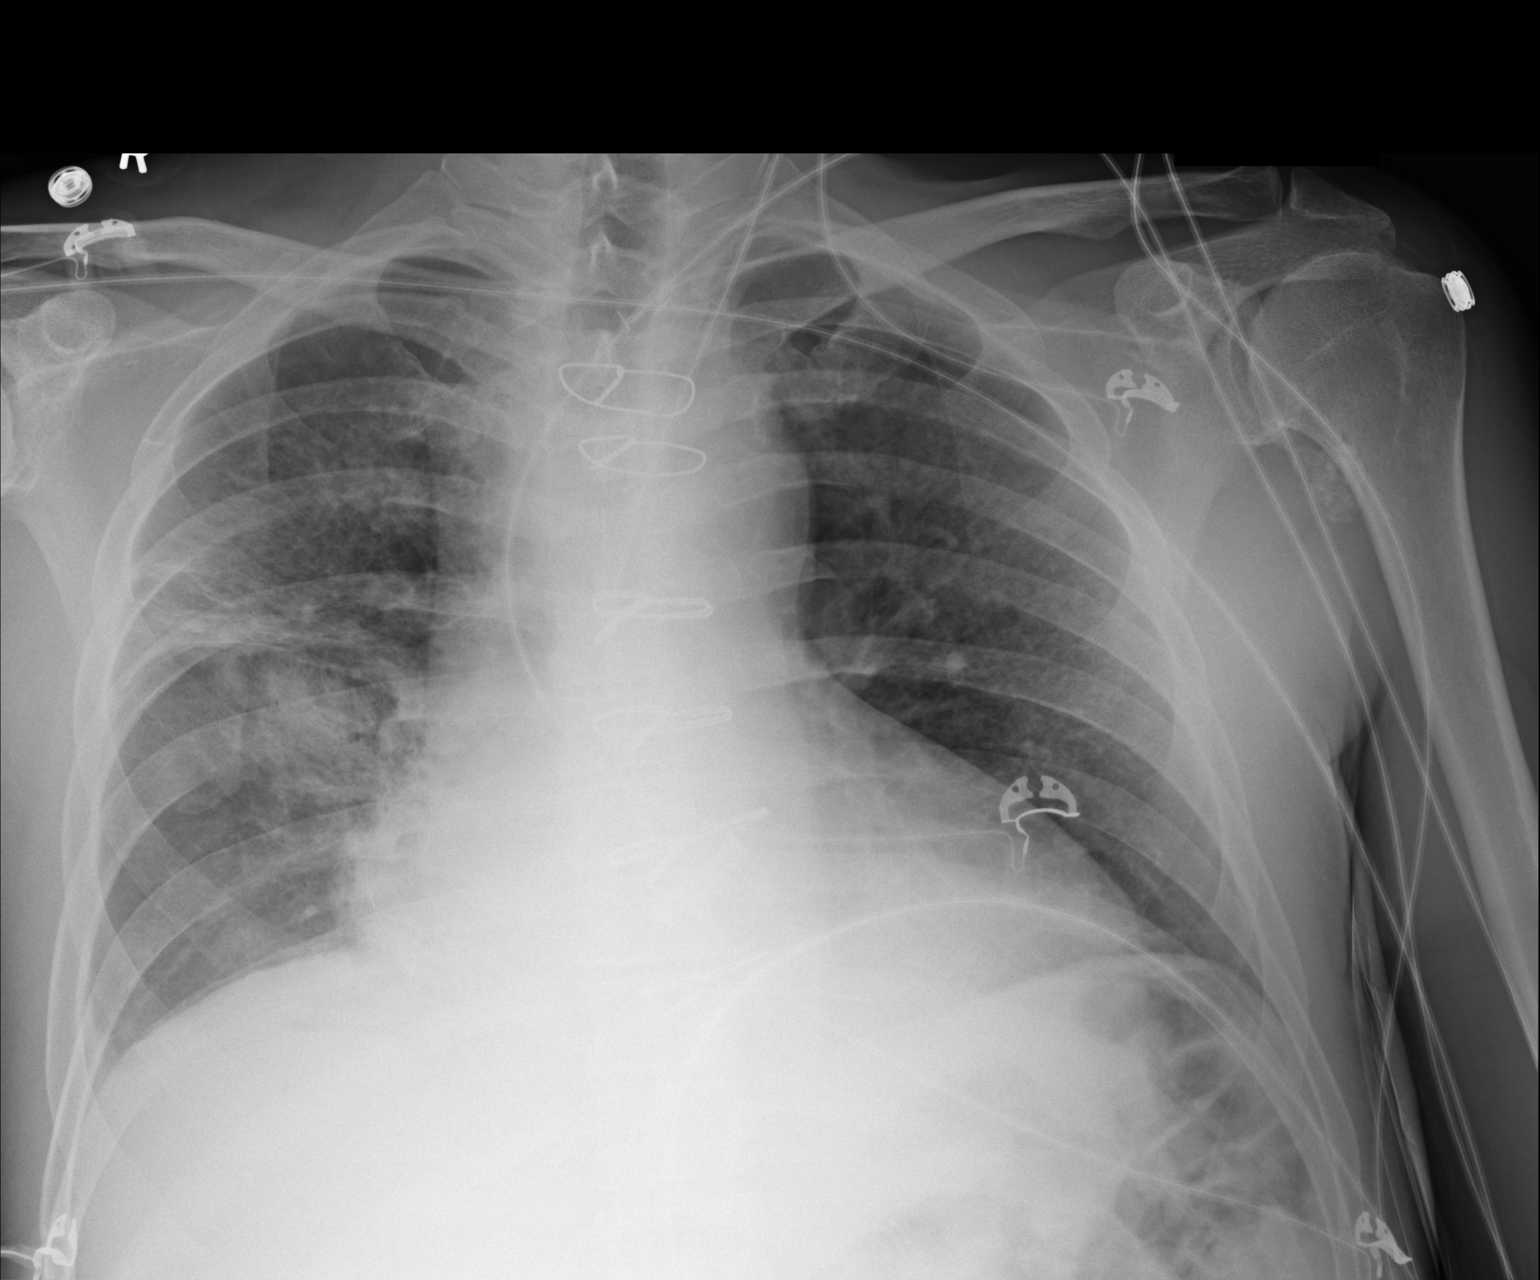

[1 of 1 positions shown; findings below may reference images not displayed]

FINDINGS: Cardiomegaly again noted.  Status post median sternotomy.
Persistent hazy atelectasis or infiltrate right midlung.  No
diagnostic pneumothorax.
IMPRESSION: Cardiomegaly.  Status post median sternotomy.  Persistent hazy
atelectasis or infiltrates right midlung.

## 2015-08-08 DIAGNOSIS — I480 Paroxysmal atrial fibrillation: Secondary | ICD-10-CM

## 2015-08-08 DIAGNOSIS — Z952 Presence of prosthetic heart valve: Secondary | ICD-10-CM

## 2015-08-08 DIAGNOSIS — I6990 Unspecified sequelae of unspecified cerebrovascular disease: Secondary | ICD-10-CM | POA: Insufficient documentation

## 2015-08-08 HISTORY — DX: Presence of prosthetic heart valve: Z95.2

## 2015-08-08 HISTORY — DX: Paroxysmal atrial fibrillation: I48.0

## 2015-08-08 HISTORY — DX: Unspecified sequelae of unspecified cerebrovascular disease: I69.90

## 2015-10-23 DIAGNOSIS — Z952 Presence of prosthetic heart valve: Secondary | ICD-10-CM | POA: Diagnosis not present

## 2015-10-23 DIAGNOSIS — R079 Chest pain, unspecified: Secondary | ICD-10-CM | POA: Diagnosis not present

## 2015-10-23 DIAGNOSIS — R55 Syncope and collapse: Secondary | ICD-10-CM | POA: Diagnosis not present

## 2015-10-23 DIAGNOSIS — J189 Pneumonia, unspecified organism: Secondary | ICD-10-CM | POA: Diagnosis not present

## 2015-10-24 DIAGNOSIS — R079 Chest pain, unspecified: Secondary | ICD-10-CM | POA: Diagnosis not present

## 2015-10-24 DIAGNOSIS — J189 Pneumonia, unspecified organism: Secondary | ICD-10-CM | POA: Diagnosis not present

## 2015-10-24 DIAGNOSIS — R55 Syncope and collapse: Secondary | ICD-10-CM | POA: Diagnosis not present

## 2015-10-24 DIAGNOSIS — Z952 Presence of prosthetic heart valve: Secondary | ICD-10-CM | POA: Diagnosis not present

## 2016-05-22 DIAGNOSIS — W19XXXA Unspecified fall, initial encounter: Secondary | ICD-10-CM | POA: Diagnosis not present

## 2016-05-22 DIAGNOSIS — R079 Chest pain, unspecified: Secondary | ICD-10-CM

## 2016-05-22 DIAGNOSIS — I4510 Unspecified right bundle-branch block: Secondary | ICD-10-CM

## 2016-05-22 DIAGNOSIS — Z982 Presence of cerebrospinal fluid drainage device: Secondary | ICD-10-CM | POA: Diagnosis not present

## 2016-05-23 DIAGNOSIS — R079 Chest pain, unspecified: Secondary | ICD-10-CM | POA: Diagnosis not present

## 2016-05-23 DIAGNOSIS — I4510 Unspecified right bundle-branch block: Secondary | ICD-10-CM | POA: Diagnosis not present

## 2016-05-23 DIAGNOSIS — R55 Syncope and collapse: Secondary | ICD-10-CM

## 2016-05-23 DIAGNOSIS — Z982 Presence of cerebrospinal fluid drainage device: Secondary | ICD-10-CM | POA: Diagnosis not present

## 2016-05-23 DIAGNOSIS — W19XXXA Unspecified fall, initial encounter: Secondary | ICD-10-CM | POA: Diagnosis not present

## 2016-12-20 DIAGNOSIS — K8020 Calculus of gallbladder without cholecystitis without obstruction: Secondary | ICD-10-CM

## 2016-12-20 DIAGNOSIS — Z7901 Long term (current) use of anticoagulants: Secondary | ICD-10-CM

## 2016-12-20 HISTORY — DX: Long term (current) use of anticoagulants: Z79.01

## 2016-12-20 HISTORY — DX: Calculus of gallbladder without cholecystitis without obstruction: K80.20

## 2017-09-10 DIAGNOSIS — R471 Dysarthria and anarthria: Secondary | ICD-10-CM

## 2017-09-10 DIAGNOSIS — G450 Vertebro-basilar artery syndrome: Secondary | ICD-10-CM

## 2017-09-10 HISTORY — DX: Dysarthria and anarthria: R47.1

## 2017-09-10 HISTORY — DX: Vertebro-basilar artery syndrome: G45.0

## 2017-10-16 DIAGNOSIS — I616 Nontraumatic intracerebral hemorrhage, multiple localized: Secondary | ICD-10-CM | POA: Insufficient documentation

## 2017-10-16 HISTORY — DX: Nontraumatic intracerebral hemorrhage, multiple localized: I61.6

## 2017-12-09 ENCOUNTER — Other Ambulatory Visit: Payer: Self-pay

## 2017-12-10 ENCOUNTER — Ambulatory Visit: Payer: Medicaid Other | Admitting: Cardiology

## 2017-12-10 ENCOUNTER — Encounter: Payer: Self-pay | Admitting: Cardiology

## 2017-12-10 VITALS — BP 110/80 | HR 60 | Ht 67.0 in | Wt 233.0 lb

## 2017-12-10 DIAGNOSIS — I69920 Aphasia following unspecified cerebrovascular disease: Secondary | ICD-10-CM

## 2017-12-10 DIAGNOSIS — Z7901 Long term (current) use of anticoagulants: Secondary | ICD-10-CM

## 2017-12-10 DIAGNOSIS — I480 Paroxysmal atrial fibrillation: Secondary | ICD-10-CM | POA: Diagnosis not present

## 2017-12-10 DIAGNOSIS — I7100 Dissection of unspecified site of aorta: Secondary | ICD-10-CM

## 2017-12-10 DIAGNOSIS — Z952 Presence of prosthetic heart valve: Secondary | ICD-10-CM | POA: Diagnosis not present

## 2017-12-10 DIAGNOSIS — I6990 Unspecified sequelae of unspecified cerebrovascular disease: Secondary | ICD-10-CM

## 2017-12-10 DIAGNOSIS — N529 Male erectile dysfunction, unspecified: Secondary | ICD-10-CM

## 2017-12-10 DIAGNOSIS — I719 Aortic aneurysm of unspecified site, without rupture: Secondary | ICD-10-CM

## 2017-12-10 LAB — BASIC METABOLIC PANEL
BUN/Creatinine Ratio: 10 (ref 9–20)
BUN: 11 mg/dL (ref 6–24)
CALCIUM: 9.6 mg/dL (ref 8.7–10.2)
CHLORIDE: 103 mmol/L (ref 96–106)
CO2: 22 mmol/L (ref 20–29)
CREATININE: 1.12 mg/dL (ref 0.76–1.27)
GFR calc Af Amer: 87 mL/min/{1.73_m2} (ref >59)
GFR calc non Af Amer: 76 mL/min/{1.73_m2} (ref >59)
GLUCOSE: 97 mg/dL (ref 65–99)
Potassium: 4.9 mmol/L (ref 3.5–5.2)
Sodium: 140 mmol/L (ref 134–144)

## 2017-12-10 LAB — PROTIME-INR
INR: 2.9 — AB (ref 0.8–1.2)
PROTHROMBIN TIME: 27.4 s — AB (ref 9.1–12.0)

## 2017-12-10 NOTE — Progress Notes (Signed)
Cardiology Office Note:    Date:  12/10/2017   ID:  Jon [name], DOB 1966/03/01, MRN [id number]  PCP:  Guadalupe Maple., MD  Cardiologist:  Gypsy Balsam, MD    Referring MD: Guadalupe Maple., MD   Chief Complaint  Patient presents with  . Follow-up  Doing well cardiac wise  History of Present Illness:    Jon [name] is a 52 y.o. male with complex past medical history which includes ascending aortic dissection in 2013 that required emergency surgery as well as aortic valve replacement.  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  Sadly his surgery was complicated by CVA.  He is coming to our office for follow-up.  Overall doing well denies having a chest pain tightness squeezing pressure burning chest.  Biggest complaint that he got today is actually a rectal dysfunction he try some medication with no success.  He would like to get the pump implant however apparently insurance company does not approve that.  Denies having any palpitations.  Past Medical History:  Diagnosis Date  . Aneurysm of thoracic aorta (HCC) 05/06/2012   IMO routine update IMO routine update  . Anxiety    sees Dr. Renae Fickle  . Aortic aneurysm and dissection (HCC) 05/06/2012  . Aortic stenosis    mechanical AVR 04/22/12  . Aortic valve disorder 05/06/2012  . 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 Bicuspid aortic valve   . Cerebral infarction (HCC) 05/11/2012  . Cerebral thrombosis with cerebral infarction (HCC) 07/14/2013  . Cholelithiasis 12/20/2016  . Current use of long term anticoagulation 12/20/2016  . CVA (cerebral infarction)    occipital CVA 12/13  . Dissecting aortic aneurysm (HCC) 05/06/2012  . Dysarthria 09/10/2017  . Dysrhythmia    sees Dr. Geronimo Running, [facility] cardiology in [place]  . Generalized constriction of visual field 10/15/2012   IMOUPDATE IMOUPDATE  . 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 GERD (gastroesophageal reflux disease)   .  HCAP (healthcare-associated pneumonia) 05/06/2012  . Headache(784.0)    "due to vision problem"  . History of CVA (cerebrovascular accident) 05/07/2012  . Hypothyroidism   . Idiopathic peripheral neuropathy 07/14/2013  . Late effects of CVA (cerebrovascular accident) 08/08/2015  . Nontraumatic multiple localized intracerebral hemorrhages (HCC) 10/16/2017  . Paroxysmal atrial fibrillation (HCC) 08/08/2015   On warfarin On warfarin  . Presence of prosthetic heart valve 08/08/2015  . Shortness of breath   . Stroke (HCC)    x3  . Stroke, embolic (HCC) 05/07/2012  . Syncope 05/06/2012  . Thoracic aortic aneurysm (HCC)    replacement aortic root 04/22/12  . VBI (vertebrobasilar insufficiency) 09/10/2017    Past Surgical History:  Procedure Laterality Date  . APPLICATION OF WOUND VAC  06/11/2012   Procedure: APPLICATION OF WOUND VAC;  Surgeon: [name], MD;  Location: [facility] OR;  Service: Vascular;  Laterality: Right;  . CARDIAC CATHETERIZATION    . CHOLECYSTECTOMY    . CORONARY ARTERY BYPASS GRAFT  04/22/2012   Procedure: CORONARY ARTERY BYPASS GRAFTING (CABG);  Surgeon: [name], MD;  Location: [facility] OR;  Service: Open Heart Surgery;  Laterality: N/A;  times one with right greater saphenous vein graft  . I&D EXTREMITY  06/11/2012   Procedure: IRRIGATION AND DEBRIDEMENT EXTREMITY;  Surgeon: [name], MD;  Location: [facility] OR;  Service: Vascular;  Laterality: Right;  . INTRAOPERATIVE TRANSESOPHAGEAL ECHOCARDIOGRAM  04/22/2012   Procedure: INTRAOPERATIVE TRANSESOPHAGEAL ECHOCARDIOGRAM;  Surgeon: [name], MD;  Location:  [facility] OR;  Service: Open Heart Surgery;  Laterality: N/A;  . ROTATOR CUFF REPAIR     12 years ago, Left  . THORACIC AORTIC ANEURYSM REPAIR  04/22/2012   Procedure: THORACIC ASCENDING ANEURYSM REPAIR (AAA);  Surgeon: [name], MD;  Location: [facility] OR;  Service: Open Heart Surgery;  Laterality: N/A;    Current Medications: Current Meds    Medication Sig  . amiodarone (PACERONE) 200 MG tablet Take 200 mg by mouth 2 (two) times daily.  Marland Kitchen aspirin 81 MG chewable tablet Chew 1 tablet (81 mg total) by mouth daily.     Allergies:   Hydrocodone-acetaminophen; Hydromorphone; Other; Oxycodone; Penicillins; and Dilaudid [hydromorphone hcl]   Social History   Socioeconomic History  . Marital status: Married    Spouse name: Not on file  . Number of children: 2  . Years of education: Not on file  . Highest education level: Not on file  Occupational History  . Occupation: disabled  . Occupation: Curator  Social Needs  . Financial resource strain: Not on file  . Food insecurity:    Worry: Not on file    Inability: Not on file  . Transportation needs:    Medical: Not on file    Non-medical: Not on file  Tobacco Use  . Smoking status: Current Every Day Smoker    Packs/day: 2.50    Types: Cigarettes    Start date: 04/21/2012    Last attempt to quit: 04/21/2012    Years since quitting: 5.6  . Smokeless tobacco: Never Used  Substance and Sexual Activity  . Alcohol use: Yes  . Drug use: No    Comment: previous crack cocaine "quit 4 years ago"  . Sexual activity: Yes  Lifestyle  . Physical activity:    Days per week: Not on file    Minutes per session: Not on file  . Stress: Not on file  Relationships  . Social connections:    Talks on phone: Not on file    Gets together: Not on file    Attends religious service: Not on file    Active member of club or organization: Not on file    Attends meetings of clubs or organizations: Not on file    Relationship status: Not on file  Other Topics Concern  . Not on file  Social History Narrative  . Not on file     Family History: The patient's family history includes Diabetes in his father; Hypertension in his father; Lung disease in his mother; Peripheral Artery Disease in his father. ROS:   Please see the history of present illness.    All 14 point review of systems  negative except as described per history of present illness  EKGs/Labs/Other Studies Reviewed:    Echocardiogram done on 10/24/2017  Summary Normal left ventricular size and systolic function with no appreciable segmental abnormality. Mild concentric left ventricular hypertrophy Ejection fraction is visually estimated at 55-60% Indeterminate diastolic function Normally functioning mechanical prosthetic valve in aortic position. Mean transaortic gradient (Doppler) 15   Recent Labs: No results found for requested labs within last 8760 hours.  Recent Lipid Panel    Component Value Date/Time   CHOL 114 05/08/2012 0420   TRIG 54 05/08/2012 0420   HDL 34 (L) 05/08/2012 0420   CHOLHDL 3.4 05/08/2012 0420   VLDL 11 05/08/2012 0420   LDLCALC 69 05/08/2012 0420    Physical Exam:    VS:  BP 110/80 (BP Location: Left Arm, Patient Position:  Sitting, Cuff Size: Normal)   Pulse 60   Ht 5\' 7"  (1.702 m)   Wt 233 lb (105.7 kg)   SpO2 98%   BMI 36.49 kg/m     Wt Readings from Last 3 Encounters:  12/10/17 233 lb (105.7 kg)  06/11/12 173 lb 11.6 oz (78.8 kg)  06/10/12 162 lb (73.5 kg)     GEN:  Well nourished, well developed in no acute distress HEENT: Normal NECK: No JVD; No carotid bruits LYMPHATICS: No lymphadenopathy CARDIAC: RRR, systolic ejection murmur grade 1/6 to 2/6 best heard at right upper portion of the sternum, mechanical valve sounds are crisp's., no rubs, no gallops RESPIRATORY:  Clear to auscultation without rales, wheezing or rhonchi  ABDOMEN: Soft, non-tender, non-distended MUSCULOSKELETAL:  No edema; No deformity  SKIN: Warm and dry LOWER EXTREMITIES: no swelling NEUROLOGIC:  Alert and oriented x 3 PSYCHIATRIC:  Normal affect   ASSESSMENT:    1. Paroxysmal atrial fibrillation (HCC)   2. Late effects of CVA (cerebrovascular accident)   3. Presence of prosthetic heart valve   4. Current use of long term anticoagulation   5. Aphasia due to late effects of  cerebrovascular disease   6. Aortic aneurysm and dissection (HCC)   7. Vasculogenic erectile dysfunction, unspecified vasculogenic erectile dysfunction type    PLAN:    In order of problems listed above:  1. Paroxysmal atrial fibrillation: Anticoagulated with Coumadin which I will continue.  Denies having any recent episodes of palpitations.  His EKG today showed sinus bradycardia rate of 55 first-degree AV block left axis deviation with right bundle branch block evidence of left ventricle hypertrophy.  I will continue anticoagulation as well as amiodarone that he takes 200 mg every other day. 2. Late effect of CVA: That being followed by neurology. 3. Presence of prosthetic aortic valve.  Recent echocardiogram done in June of this year echocardiogram reviewed normal function with minimal gradient across the valve. 4. Current use of anticoagulation: INR will be checked today. 5. Aortic aneurysm and dissection status post repair in 2013 will check Chem-7 if Chem-7 is fine we will do CT of his abdomen and chest to look at his aorta. 6. Rectal dysfunction: We will refer him to Dr. Saddie Bendershao.  I see him back in my office in 5 months or sooner if he get a problem   Medication Adjustments/Labs and Tests Ordered: Current medicines are reviewed at length with the patient today.  Concerns regarding medicines are outlined above.  No orders of the defined types were placed in this encounter.  Medication changes: No orders of the defined types were placed in this encounter.   Signed, Georgeanna Leaobert J. Peggyann Zwiefelhofer, MD, [facility] 12/10/2017 9:27 AM    [facility]

## 2017-12-10 NOTE — Patient Instructions (Signed)
Medication Instructions:  Your physician recommends that you continue on your current medications as directed. Please refer to the Current Medication list given to you today.   Labwork: Your physician recommends that you return for lab work today: BMP and INR.   Testing/Procedures: Non-Cardiac CT Angiography (CTA), is a special type of CT scan that uses a computer to produce multi-dimensional views of major blood vessels throughout the body. In CT angiography, a contrast material is injected through an IV to help visualize the blood vessels  Your physician has requested that you have a carotid duplex. This test is an ultrasound of the carotid arteries in your neck. It looks at blood flow through these arteries that supply the brain with blood. Allow one hour for this exam. There are no restrictions or special instructions.    Follow-Up: Your physician wants you to follow-up in: 5 months. You will receive a reminder letter in the mail two months in advance. If you don't receive a letter, please call our office to schedule the follow-up appointment.   Any Other Special Instructions Will Be Listed Below (If Applicable).  Dr.Krasowski has referred you to Dr. Saddie Bendershao. Their office should contact you within a week. If not please contact our office.      If you need a refill on your cardiac medications before your next appointment, please call your pharmacy.   CT Angiogram A CT angiogram is a procedure to look at the blood vessels in various areas of the body. For this procedure, a large X-ray machine, called a CT scanner, takes detailed pictures of blood vessels that have been injected with a dye (contrast material). A CT angiogram allows your health care provider to see how well blood is flowing to the area of your body that is being checked. Your health care provider will be able to see if there are any problems, such as a blockage. Tell a health care provider about:  Any allergies you  have.  All medicines you are taking, including vitamins, herbs, eye drops, creams, and over-the-counter medicines.  Any problems you or family members have had with anesthetic medicines.  Any blood disorders you have.  Any surgeries you have had.  Any medical conditions you have.  Whether you are pregnant or may be pregnant.  Whether you are breastfeeding.  Any anxiety disorders, chronic pain, or other conditions you have that may increase your stress or prevent you from lying still. What are the risks? Generally, this is a safe procedure. However, problems may occur, including:  Infection.  Bleeding.  Allergic reactions to medicines or dyes.  Damage to other structures or organs.  Kidney damage from the dye or contrast that is used.  Increased risk of cancer from radiation exposure. This risk is low. Talk with your health care provider about: ? The risks and benefits of testing. ? How you can receive the lowest dose of radiation.  What happens before the procedure?  Wear comfortable clothing and remove any jewelry.  Follow instructions from your health care provider about eating and drinking. For most people, instructions may include these actions: ? For 12 hours before the test, avoid caffeine. This includes tea, coffee, soda, and energy drinks or pills. ? For 3-4 hours before the test, stop eating or drinking anything but water. ? Stay well hydrated by continuing to drink water before the exam. This will help to clear the contrast dye from your body after the test.  Ask your health care provider about changing  or stopping your regular medicines. This is especially important if you are taking diabetes medicines or blood thinners. What happens during the procedure?  An IV tube will be inserted into one of your veins.  You will be asked to lie on an exam table. This table will slide in and out of the CT machine during the procedure.  Contrast dye will be injected  into the IV tube. You might feel warm, or you may get a metallic taste in your mouth.  The table that you are lying on will move into the CT machine tunnel for the scan.  The person running the machine will give you instructions while the scans are being done. You may be asked to: ? Keep your arms above your head. ? Hold your breath. ? Stay very still, even if the table is moving.  When the scanning is complete, you will be moved out of the machine.  The IV tube will be removed. The procedure may vary among health care providers and hospitals. What happens after the procedure?  You might feel warm, or you may get a metallic taste in your mouth.  You may be asked to drink water or other fluids to wash (flush) the contrast material out of your body.  It is up to you to get the results of your procedure. Ask your health care provider, or the department that is doing the procedure, when your results will be ready. Summary  A CT angiogram is a procedure to look at the blood vessels in various areas of the body.  You will need to stay very still during the exam.  You may be asked to drink water or other fluids to wash (flush) the contrast material out of your body after your scan. This information is not intended to replace advice given to you by your health care provider. Make sure you discuss any questions you have with your health care provider. Document Released: 12/22/2015 Document Revised: 12/22/2015 Document Reviewed: 12/22/2015 Elsevier Interactive Patient Education  [employer].

## 2017-12-17 ENCOUNTER — Ambulatory Visit (INDEPENDENT_AMBULATORY_CARE_PROVIDER_SITE_OTHER): Payer: Medicaid Other

## 2017-12-17 DIAGNOSIS — Z952 Presence of prosthetic heart valve: Secondary | ICD-10-CM

## 2017-12-17 DIAGNOSIS — I359 Nonrheumatic aortic valve disorder, unspecified: Secondary | ICD-10-CM

## 2017-12-17 DIAGNOSIS — I6330 Cerebral infarction due to thrombosis of unspecified cerebral artery: Secondary | ICD-10-CM

## 2017-12-17 DIAGNOSIS — I480 Paroxysmal atrial fibrillation: Secondary | ICD-10-CM | POA: Diagnosis not present

## 2017-12-17 NOTE — Progress Notes (Signed)
[facility]

## 2017-12-17 NOTE — Patient Instructions (Signed)
Description   Continue the current dose of 10 mg daily and then return in 2 weeks for INR check. Please call the office with any medication changes or concerns.

## 2017-12-24 ENCOUNTER — Ambulatory Visit (INDEPENDENT_AMBULATORY_CARE_PROVIDER_SITE_OTHER): Payer: Medicaid Other

## 2017-12-24 DIAGNOSIS — Z952 Presence of prosthetic heart valve: Secondary | ICD-10-CM | POA: Diagnosis not present

## 2017-12-24 DIAGNOSIS — I6330 Cerebral infarction due to thrombosis of unspecified cerebral artery: Secondary | ICD-10-CM | POA: Diagnosis not present

## 2017-12-24 DIAGNOSIS — I480 Paroxysmal atrial fibrillation: Secondary | ICD-10-CM

## 2017-12-24 DIAGNOSIS — I359 Nonrheumatic aortic valve disorder, unspecified: Secondary | ICD-10-CM

## 2017-12-24 LAB — POCT INR: INR: 3.2 — AB (ref 2.0–3.0)

## 2017-12-24 NOTE — Patient Instructions (Signed)
Description   Continue the current dose of 10 mg daily and then return in 3 weeks for INR check. Please call the office with any medication changes or concerns.

## 2018-01-14 ENCOUNTER — Ambulatory Visit (INDEPENDENT_AMBULATORY_CARE_PROVIDER_SITE_OTHER): Payer: Medicaid Other | Admitting: Pharmacist

## 2018-01-14 DIAGNOSIS — I6330 Cerebral infarction due to thrombosis of unspecified cerebral artery: Secondary | ICD-10-CM

## 2018-01-14 DIAGNOSIS — Z952 Presence of prosthetic heart valve: Secondary | ICD-10-CM

## 2018-01-14 DIAGNOSIS — I480 Paroxysmal atrial fibrillation: Secondary | ICD-10-CM | POA: Diagnosis not present

## 2018-01-14 DIAGNOSIS — I359 Nonrheumatic aortic valve disorder, unspecified: Secondary | ICD-10-CM

## 2018-01-14 LAB — POCT INR: INR: 3.8 — AB (ref 2.0–3.0)

## 2018-01-14 NOTE — Patient Instructions (Signed)
Description   Skip today then continue the current dose of 10 mg daily and then return in 3 weeks for INR check. Please call the office with any medication changes or concerns.

## 2018-01-16 ENCOUNTER — Telehealth: Payer: Self-pay | Admitting: Emergency Medicine

## 2018-01-16 NOTE — Telephone Encounter (Signed)
Informed patient mother per dpr of ct results.

## 2018-01-18 DIAGNOSIS — E039 Hypothyroidism, unspecified: Secondary | ICD-10-CM

## 2018-01-18 DIAGNOSIS — S0083XA Contusion of other part of head, initial encounter: Secondary | ICD-10-CM

## 2018-01-18 DIAGNOSIS — F1010 Alcohol abuse, uncomplicated: Secondary | ICD-10-CM

## 2018-01-18 DIAGNOSIS — Z720 Tobacco use: Secondary | ICD-10-CM

## 2018-01-18 DIAGNOSIS — R569 Unspecified convulsions: Secondary | ICD-10-CM

## 2018-01-18 DIAGNOSIS — R55 Syncope and collapse: Secondary | ICD-10-CM

## 2018-01-18 DIAGNOSIS — Z952 Presence of prosthetic heart valve: Secondary | ICD-10-CM

## 2018-01-18 DIAGNOSIS — K219 Gastro-esophageal reflux disease without esophagitis: Secondary | ICD-10-CM

## 2018-01-18 DIAGNOSIS — N179 Acute kidney failure, unspecified: Secondary | ICD-10-CM

## 2018-01-18 DIAGNOSIS — R9431 Abnormal electrocardiogram [ECG] [EKG]: Secondary | ICD-10-CM

## 2018-01-19 DIAGNOSIS — S0083XA Contusion of other part of head, initial encounter: Secondary | ICD-10-CM | POA: Diagnosis not present

## 2018-01-19 DIAGNOSIS — R55 Syncope and collapse: Secondary | ICD-10-CM

## 2018-01-20 ENCOUNTER — Telehealth: Payer: Self-pay | Admitting: Cardiology

## 2018-01-20 NOTE — Telephone Encounter (Signed)
Spoke to patient mother. Patient scheduled for f/u on 9/25. No further questions.

## 2018-01-20 NOTE — Telephone Encounter (Signed)
States he was in the hospital over the weekend and they want Dr Kirtland BouchardK to "do something"

## 2018-01-20 NOTE — Telephone Encounter (Signed)
Attempted to return patient call unable to reach them. Will continue efforts.

## 2018-01-29 ENCOUNTER — Ambulatory Visit (INDEPENDENT_AMBULATORY_CARE_PROVIDER_SITE_OTHER): Payer: Medicaid Other | Admitting: Pharmacist

## 2018-01-29 ENCOUNTER — Ambulatory Visit (INDEPENDENT_AMBULATORY_CARE_PROVIDER_SITE_OTHER): Payer: Medicaid Other | Admitting: Cardiology

## 2018-01-29 ENCOUNTER — Encounter: Payer: Self-pay | Admitting: Cardiology

## 2018-01-29 VITALS — BP 100/62 | HR 61 | Wt 236.1 lb

## 2018-01-29 DIAGNOSIS — R55 Syncope and collapse: Secondary | ICD-10-CM

## 2018-01-29 DIAGNOSIS — I480 Paroxysmal atrial fibrillation: Secondary | ICD-10-CM

## 2018-01-29 DIAGNOSIS — I6330 Cerebral infarction due to thrombosis of unspecified cerebral artery: Secondary | ICD-10-CM

## 2018-01-29 DIAGNOSIS — I6990 Unspecified sequelae of unspecified cerebrovascular disease: Secondary | ICD-10-CM | POA: Diagnosis not present

## 2018-01-29 DIAGNOSIS — I359 Nonrheumatic aortic valve disorder, unspecified: Secondary | ICD-10-CM

## 2018-01-29 DIAGNOSIS — Z952 Presence of prosthetic heart valve: Secondary | ICD-10-CM

## 2018-01-29 LAB — POCT INR: INR: 2 (ref 2.0–3.0)

## 2018-01-29 MED ORDER — WARFARIN SODIUM 5 MG PO TABS: ORAL_TABLET | ORAL | 1 refills | Status: DC

## 2018-01-29 NOTE — Patient Instructions (Signed)
Description   Take 2.5 tablets today then continue the current dose of 10 mg (2 tablets) daily. Recheck INR in 1 week.

## 2018-01-29 NOTE — Progress Notes (Signed)
Cardiology Office Note:    Date:  01/29/2018   ID:  Jon [name], DOB 1965/07/15, MRN [id number]  PCP:  Guadalupe Maple., MD  Cardiologist:  Gypsy Balsam, MD    Referring MD: Guadalupe Maple., MD   No chief complaint on file. I was in the emergency room and hospital  History of Present Illness:    Jon [name] is a 52 y.o. male with history of aortic aneurysm rupture required emergency surgery complicated by CVA been doing quite well since that time.  Also required aortic valve replacement with mechanical prosthesis.  About a week ago he was walking and then passed out it is always very difficult to get good story from him.  But it appears to be sudden and abrupt onset of syncope after that he woke up he was fine up going to the emergency room quite extensive evaluation has been done he was discharged home.  He apparently passed out before but that was usually provoked by something like cough.  This time it was unprovoked.  He does have baseline right bundle branch block is that as well as left anterior hemiblock therefore arrhythmia evaluation need to be done.  Past Medical History:  Diagnosis Date  . Aneurysm of thoracic aorta (HCC) 05/06/2012   IMO routine update IMO routine update  . Anxiety    sees Dr. Renae Fickle  . Aortic aneurysm and dissection (HCC) 05/06/2012  . Aortic stenosis    mechanical AVR 04/22/12  . Aortic valve disorder 05/06/2012  . 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 Bicuspid aortic valve   . Cerebral infarction (HCC) 05/11/2012  . Cerebral thrombosis with cerebral infarction (HCC) 07/14/2013  . Cholelithiasis 12/20/2016  . Current use of long term anticoagulation 12/20/2016  . CVA (cerebral infarction)    occipital CVA 12/13  . Dissecting aortic aneurysm (HCC) 05/06/2012  . Dysarthria 09/10/2017  . Dysrhythmia    sees Dr. Geronimo Running, [facility] cardiology in [place]  . Generalized constriction of visual field 10/15/2012   IMOUPDATE IMOUPDATE  . 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 GERD (gastroesophageal reflux disease)   . HCAP (healthcare-associated pneumonia) 05/06/2012  . Headache(784.0)    "due to vision problem"  . History of CVA (cerebrovascular accident) 05/07/2012  . Hypothyroidism   . Idiopathic peripheral neuropathy 07/14/2013  . Late effects of CVA (cerebrovascular accident) 08/08/2015  . Nontraumatic multiple localized intracerebral hemorrhages (HCC) 10/16/2017  . Paroxysmal atrial fibrillation (HCC) 08/08/2015   On warfarin On warfarin  . Presence of prosthetic heart valve 08/08/2015  . Shortness of breath   . Stroke (HCC)    x3  . Stroke, embolic (HCC) 05/07/2012  . Syncope 05/06/2012  . Thoracic aortic aneurysm (HCC)    replacement aortic root 04/22/12  . VBI (vertebrobasilar insufficiency) 09/10/2017    Past Surgical History:  Procedure Laterality Date  . APPLICATION OF WOUND VAC  06/11/2012   Procedure: APPLICATION OF WOUND VAC;  Surgeon: [name], MD;  Location: [facility] OR;  Service: Vascular;  Laterality: Right;  . CARDIAC CATHETERIZATION    . CHOLECYSTECTOMY    . CORONARY ARTERY BYPASS GRAFT  04/22/2012   Procedure: CORONARY ARTERY BYPASS GRAFTING (CABG);  Surgeon: [name], MD;  Location: [facility] OR;  Service: Open Heart Surgery;  Laterality: N/A;  times one with right greater saphenous vein graft  . I&D EXTREMITY  06/11/2012   Procedure: IRRIGATION AND DEBRIDEMENT EXTREMITY;  Surgeon: [name], MD;  Location:  [facility] OR;  Service: Vascular;  Laterality: Right;  . INTRAOPERATIVE TRANSESOPHAGEAL ECHOCARDIOGRAM  04/22/2012   Procedure: INTRAOPERATIVE TRANSESOPHAGEAL ECHOCARDIOGRAM;  Surgeon: [name], MD;  Location: [facility] OR;  Service: Open Heart Surgery;  Laterality: N/A;  . ROTATOR CUFF REPAIR     12 years ago, Left  . THORACIC AORTIC ANEURYSM REPAIR  04/22/2012   Procedure: THORACIC ASCENDING ANEURYSM REPAIR (AAA);  Surgeon: [name], MD;  Location: [facility] OR;  Service: Open Heart Surgery;  Laterality: N/A;    Current Medications: No outpatient medications have been marked as taking for the 01/29/18 encounter (Office Visit) with Georgeanna Lea, MD.     Allergies:   Hydrocodone-acetaminophen; Hydromorphone; Other; Oxycodone; Penicillins; and Dilaudid [hydromorphone hcl]   Social History   Socioeconomic History  . Marital status: Married    Spouse name: Not on file  . Number of children: 2  . Years of education: Not on file  . Highest education level: Not on file  Occupational History  . Occupation: disabled  . Occupation: Curator  Social Needs  . Financial resource strain: Not on file  . Food insecurity:    Worry: Not on file    Inability: Not on file  . Transportation needs:    Medical: Not on file    Non-medical: Not on file  Tobacco Use  . Smoking status: Current Every Day Smoker    Packs/day: 2.50    Types: Cigarettes    Start date: 04/21/2012    Last attempt to quit: 04/21/2012    Years since quitting: 5.7  . Smokeless tobacco: Never Used  Substance and Sexual Activity  . Alcohol use: Yes  . Drug use: No    Comment: previous crack cocaine "quit 4 years ago"  . Sexual activity: Yes  Lifestyle  . Physical activity:    Days per week: Not on file    Minutes per session: Not on file  . Stress: Not on file  Relationships  . Social connections:    Talks on phone: Not on file    Gets together: Not on file    Attends religious service: Not on file    Active member of club or organization: Not on file    Attends meetings of clubs or organizations: Not on file    Relationship status: Not on file  Other Topics Concern  . Not on file  Social History Narrative  . Not on file     Family History: The patient's family history includes Diabetes in his father; Hypertension in his father; Lung disease in his mother; Peripheral Artery Disease in his father. ROS:   Please see the history  of present illness.    All 14 point review of systems negative except as described per history of present illness  EKGs/Labs/Other Studies Reviewed:      Recent Labs: 12/10/2017: BUN 11; Creatinine, Ser 1.12; Potassium 4.9; Sodium 140  Recent Lipid Panel    Component Value Date/Time   CHOL 114 05/08/2012 0420   TRIG 54 05/08/2012 0420   HDL 34 (L) 05/08/2012 0420   CHOLHDL 3.4 05/08/2012 0420   VLDL 11 05/08/2012 0420   LDLCALC 69 05/08/2012 0420    Physical Exam:    VS:  BP 100/62   Pulse 61   Wt 236 lb 1.9 oz (107.1 kg)   SpO2 98%   BMI 36.98 kg/m     Wt Readings from Last 3 Encounters:  01/29/18 236 lb 1.9 oz (107.1 kg)  12/10/17  233 lb (105.7 kg)  06/11/12 173 lb 11.6 oz (78.8 kg)     GEN:  Well nourished, well developed in no acute distress HEENT: Normal NECK: No JVD; No carotid bruits LYMPHATICS: No lymphadenopathy CARDIAC: RRR, mechanical valve sound, no rubs, no gallops RESPIRATORY:  Clear to auscultation without rales, wheezing or rhonchi  ABDOMEN: Soft, non-tender, non-distended MUSCULOSKELETAL:  No edema; No deformity  SKIN: Warm and dry LOWER EXTREMITIES: no swelling NEUROLOGIC:  Alert and oriented x 3 PSYCHIATRIC:  Normal affect   ASSESSMENT:    1. Syncope, unspecified syncope type   2. Late effects of CVA (cerebrovascular accident)   3. Paroxysmal atrial fibrillation (HCC)    PLAN:    In order of problems listed above:  1. Syncope: I will ask him to wear a 30 days event recorder.  I did review his echocardiogram from hospital look like normal function of the valve with mildly diminished left ventricular ejection fraction.  Apparently he also had carotid ultrasound done which shows some significant narrowing I will try to get report of it. 2. Late effect of CVA.  Stable. 3. Paroxysmal atrial fibrillation seems to maintaining sinus rhythm.  Gentleman presented to our office after episode of syncope that required hospitalization still no  clear-cut explanation for his syncope.  We will do 30 days monitor.  We will schedule him to see neurology.   Medication Adjustments/Labs and Tests Ordered: Current medicines are reviewed at length with the patient today.  Concerns regarding medicines are outlined above.  No orders of the defined types were placed in this encounter.  Medication changes: No orders of the defined types were placed in this encounter.   Signed, Georgeanna Lea, MD, [facility] 01/29/2018 8:44 AM    [facility]

## 2018-01-29 NOTE — Patient Instructions (Addendum)
Medication Instructions:  Your physician recommends that you continue on your current medications as directed. Please refer to the Current Medication list given to you today.   Labwork: Your physician recommends that you return for lab work today: Pt/inr  Testing/Procedures: Your physician has recommended that you wear an event monitor. Event monitors are medical devices that record the heart's electrical activity. Doctors most often [place] these monitors to diagnose arrhythmias. Arrhythmias are problems with the speed or rhythm of the heartbeat. The monitor is a small, portable device. You can wear one while you do your normal daily activities. This is usually used to diagnose what is causing palpitations/syncope (passing out). Wear for 30 days.     Follow-Up: Your physician recommends that you schedule a follow-up appointment in: 6 weeks   Any Other Special Instructions Will Be Listed Below (If Applicable).  You have been referred to follow up with neurology Dr. Enrigue Catena office. You have an appointment today at 10:30am.      If you need a refill on your cardiac medications before your next appointment, please call your pharmacy.   Cardiac Event Monitoring A cardiac event monitor is a small recording device that is used to detect abnormal heart rhythms (arrhythmias). The monitor is used to record your heart rhythm when you have symptoms, such as:  Fast heartbeats (palpitations), such as heart racing or fluttering.  Dizziness.  Fainting or light-headedness.  Unexplained weakness.  Some monitors are wired to electrodes placed on your chest. Electrodes are flat, sticky disks that attach to your skin. Other monitors may be hand-held or worn on the wrist. The monitor can be worn for up to 30 days. If the monitor is attached to your chest, a technician will prepare your chest for the electrode placement and show you how to work the monitor. Take time to practice using the monitor  before you leave the office. Make sure you understand how to send the information from the monitor to your health care provider. In some cases, you may need to use a landline telephone instead of a cell phone. What are the risks? Generally, this device is safe to use, but it possible that the skin under the electrodes will become irritated. How to use your cardiac event monitor  Wear your monitor at all times, except when you are in water: ? Do not let the monitor get wet. ? Take the monitor off when you bathe. Do not swim or use a hot tub with it on.  Keep your skin clean. Do not put body lotion or moisturizer on your chest.  Change the electrodes as told by your health care provider or any time they stop sticking to your skin. You may need to use medical tape to keep them on.  Try to put the electrodes in slightly different places on your chest to help prevent skin irritation. They must remain in the area under your left breast and in the upper right section of your chest.  Make sure the monitor is safely clipped to your clothing or in a location close to your body that your health care provider recommends.  Press the button to record as soon as you feel heart-related symptoms, such as: ? Dizziness. ? Weakness. ? Light-headedness. ? Palpitations. ? Thumping or pounding in your chest. ? Shortness of breath. ? Unexplained weakness.  Keep a diary of your activities, such as walking, doing chores, and taking medicine. It is very important to note what you were doing when  you pushed the button to record your symptoms. This will help your health care provider determine what might be contributing to your symptoms.  Send the recorded information as recommended by your health care provider. It may take some time for your health care provider to process the results.  Change the batteries as told by your health care provider.  Keep electronic devices away from your monitor. This  includes: ? Tablets. ? MP3 players. ? Cell phones.  While wearing your monitor you should avoid: ? Electric blankets. ? Firefighterlectric razors. ? Electric toothbrushes. ? Microwave ovens. ? Magnets. ? Metal detectors. Get help right away if:  You have chest pain.  You have extreme difficulty breathing or shortness of breath.  You develop a very fast heartbeat that persists.  You develop dizziness that does not go away.  You faint or constantly feel like you are about to faint. Summary  A cardiac event monitor is a small recording device that is used to help detect abnormal heart rhythms (arrhythmias).  The monitor is used to record your heart rhythm when you have heart-related symptoms.  Make sure you understand how to send the information from the monitor to your health care provider.  It is important to press the button on the monitor when you have any heart-related symptoms.  Keep a diary of your activities, such as walking, doing chores, and taking medicine. It is very important to note what you were doing when you pushed the button to record your symptoms. This will help your health care provider learn what might be causing your symptoms. This information is not intended to replace advice given to you by your health care provider. Make sure you discuss any questions you have with your health care provider. Document Released: 01/31/2008 Document Revised: 04/07/2016 Document Reviewed: 04/07/2016 Elsevier Interactive Patient Education  2017 [employer]

## 2018-02-07 ENCOUNTER — Ambulatory Visit (INDEPENDENT_AMBULATORY_CARE_PROVIDER_SITE_OTHER): Payer: Medicaid Other

## 2018-02-07 DIAGNOSIS — R55 Syncope and collapse: Secondary | ICD-10-CM

## 2018-02-11 ENCOUNTER — Ambulatory Visit (INDEPENDENT_AMBULATORY_CARE_PROVIDER_SITE_OTHER): Payer: Medicaid Other | Admitting: Pharmacist

## 2018-02-11 DIAGNOSIS — I480 Paroxysmal atrial fibrillation: Secondary | ICD-10-CM

## 2018-02-11 DIAGNOSIS — Z952 Presence of prosthetic heart valve: Secondary | ICD-10-CM | POA: Diagnosis not present

## 2018-02-11 DIAGNOSIS — I6330 Cerebral infarction due to thrombosis of unspecified cerebral artery: Secondary | ICD-10-CM | POA: Diagnosis not present

## 2018-02-11 DIAGNOSIS — I359 Nonrheumatic aortic valve disorder, unspecified: Secondary | ICD-10-CM | POA: Diagnosis not present

## 2018-02-11 LAB — POCT INR: INR: 4.9 — AB (ref 2.0–3.0)

## 2018-02-11 NOTE — Patient Instructions (Signed)
Description   Skip dose today and then change dose to 10 mg (2 tablets) daily, except 1 tablet on Wednesdays. Recheck INR in 1 week.

## 2018-02-25 ENCOUNTER — Ambulatory Visit (INDEPENDENT_AMBULATORY_CARE_PROVIDER_SITE_OTHER): Payer: Medicaid Other | Admitting: Pharmacist

## 2018-02-25 DIAGNOSIS — I359 Nonrheumatic aortic valve disorder, unspecified: Secondary | ICD-10-CM | POA: Diagnosis not present

## 2018-02-25 DIAGNOSIS — I6330 Cerebral infarction due to thrombosis of unspecified cerebral artery: Secondary | ICD-10-CM | POA: Diagnosis not present

## 2018-02-25 DIAGNOSIS — Z952 Presence of prosthetic heart valve: Secondary | ICD-10-CM

## 2018-02-25 DIAGNOSIS — I480 Paroxysmal atrial fibrillation: Secondary | ICD-10-CM

## 2018-02-25 LAB — POCT INR: INR: 3.1 — AB (ref 2.0–3.0)

## 2018-02-25 NOTE — Patient Instructions (Signed)
Description   Continue 10mg everyday. Recheck INR in 3 weeks.      

## 2018-03-13 ENCOUNTER — Ambulatory Visit (INDEPENDENT_AMBULATORY_CARE_PROVIDER_SITE_OTHER): Payer: Medicaid Other | Admitting: Cardiology

## 2018-03-13 ENCOUNTER — Ambulatory Visit (INDEPENDENT_AMBULATORY_CARE_PROVIDER_SITE_OTHER): Payer: Medicaid Other

## 2018-03-13 ENCOUNTER — Encounter: Payer: Self-pay | Admitting: Cardiology

## 2018-03-13 VITALS — BP 122/78 | HR 66 | Ht 71.0 in | Wt 241.6 lb

## 2018-03-13 DIAGNOSIS — Z8673 Personal history of transient ischemic attack (TIA), and cerebral infarction without residual deficits: Secondary | ICD-10-CM | POA: Diagnosis not present

## 2018-03-13 DIAGNOSIS — I6330 Cerebral infarction due to thrombosis of unspecified cerebral artery: Secondary | ICD-10-CM

## 2018-03-13 DIAGNOSIS — I359 Nonrheumatic aortic valve disorder, unspecified: Secondary | ICD-10-CM

## 2018-03-13 DIAGNOSIS — I480 Paroxysmal atrial fibrillation: Secondary | ICD-10-CM | POA: Diagnosis not present

## 2018-03-13 DIAGNOSIS — Z952 Presence of prosthetic heart valve: Secondary | ICD-10-CM

## 2018-03-13 DIAGNOSIS — R55 Syncope and collapse: Secondary | ICD-10-CM

## 2018-03-13 DIAGNOSIS — I7100 Dissection of unspecified site of aorta: Secondary | ICD-10-CM | POA: Diagnosis not present

## 2018-03-13 DIAGNOSIS — I719 Aortic aneurysm of unspecified site, without rupture: Secondary | ICD-10-CM

## 2018-03-13 LAB — POCT INR: INR: 5 — AB (ref 2.0–3.0)

## 2018-03-13 NOTE — Progress Notes (Signed)
Cardiology Office Note:    Date:  03/13/2018   ID:  Jon [name], DOB 1966/03/15, MRN [id number]  PCP:  Guadalupe Maple., MD  Cardiologist:  Gypsy Balsam, MD    Referring MD: Guadalupe Maple., MD   Chief Complaint  Patient presents with  . 6 week follow up  Still have episode of dizziness or passing out  History of Present Illness:    Jon [name] is a 52 y.o. male with quite complex past medical history.  He did have aortic dissection that required surgical intervention also his aortic valve was replaced with mechanical prosthesis.  Complication of the surgery was CVA that he suffered from.  Since that time he seems to be doing well but his past medical history is also complicated by paroxysmal atrial fibrillation that being successfully managed with amiodarone however lately episode of atrial fibrillation became more frequent.  There are new additional problems that reappear.  He started having episode of syncope.  He does have quite extensive intraventricular conduction delay which include right bundle branch block and left anterior fascicular block.  On top of that he does have first-degree AV block.  He did wear event recorder which shows some episode of atrial fibrillation but no explanation for his syncope.  Therefore I will refer him to EP service for evaluation.  Otherwise he is doing well denies have any chest pain tightness squeezing pressure burning chest.  Past Medical History:  Diagnosis Date  . Aneurysm of thoracic aorta (HCC) 05/06/2012   IMO routine update IMO routine update  . Anxiety    sees Dr. Renae Fickle  . Aortic aneurysm and dissection (HCC) 05/06/2012  . Aortic stenosis    mechanical AVR 04/22/12  . Aortic valve disorder 05/06/2012  . 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 Bicuspid aortic valve   . Cerebral infarction (HCC) 05/11/2012  . Cerebral thrombosis with cerebral infarction (HCC) 07/14/2013  . Cholelithiasis  12/20/2016  . Current use of long term anticoagulation 12/20/2016  . CVA (cerebral infarction)    occipital CVA 12/13  . Dissecting aortic aneurysm (HCC) 05/06/2012  . Dysarthria 09/10/2017  . Dysrhythmia    sees Dr. Geronimo Running, [facility] cardiology in [place]  . Generalized constriction of visual field 10/15/2012   IMOUPDATE IMOUPDATE  . 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 GERD (gastroesophageal reflux disease)   . HCAP (healthcare-associated pneumonia) 05/06/2012  . Headache(784.0)    "due to vision problem"  . History of CVA (cerebrovascular accident) 05/07/2012  . Hypothyroidism   . Idiopathic peripheral neuropathy 07/14/2013  . Late effects of CVA (cerebrovascular accident) 08/08/2015  . Nontraumatic multiple localized intracerebral hemorrhages (HCC) 10/16/2017  . Paroxysmal atrial fibrillation (HCC) 08/08/2015   On warfarin On warfarin  . Presence of prosthetic heart valve 08/08/2015  . Shortness of breath   . Stroke (HCC)    x3  . Stroke, embolic (HCC) 05/07/2012  . Syncope 05/06/2012  . Thoracic aortic aneurysm (HCC)    replacement aortic root 04/22/12  . VBI (vertebrobasilar insufficiency) 09/10/2017    Past Surgical History:  Procedure Laterality Date  . APPLICATION OF WOUND VAC  06/11/2012   Procedure: APPLICATION OF WOUND VAC;  Surgeon: [name], MD;  Location: [facility] OR;  Service: Vascular;  Laterality: Right;  . CARDIAC CATHETERIZATION    . CHOLECYSTECTOMY    . CORONARY ARTERY BYPASS GRAFT  04/22/2012   Procedure: CORONARY ARTERY BYPASS GRAFTING (CABG);  Surgeon: [name], MD;  Location: [facility] OR;  Service: Open Heart Surgery;  Laterality: N/A;  times one with right greater saphenous vein graft  . I&D EXTREMITY  06/11/2012   Procedure: IRRIGATION AND DEBRIDEMENT EXTREMITY;  Surgeon: [name], MD;  Location: [facility] OR;  Service: Vascular;  Laterality: Right;  . INTRAOPERATIVE TRANSESOPHAGEAL ECHOCARDIOGRAM  04/22/2012   Procedure:  INTRAOPERATIVE TRANSESOPHAGEAL ECHOCARDIOGRAM;  Surgeon: [name], MD;  Location: [facility] OR;  Service: Open Heart Surgery;  Laterality: N/A;  . ROTATOR CUFF REPAIR     12 years ago, Left  . THORACIC AORTIC ANEURYSM REPAIR  04/22/2012   Procedure: THORACIC ASCENDING ANEURYSM REPAIR (AAA);  Surgeon: [name], MD;  Location: [facility] OR;  Service: Open Heart Surgery;  Laterality: N/A;    Current Medications: Current Meds  Medication Sig  . acetaminophen (TYLENOL) 500 MG tablet Take by mouth.  Marland Kitchen amiodarone (PACERONE) 200 MG tablet Take 100 mg by mouth daily.   Marland Kitchen aspirin 81 MG chewable tablet Chew 1 tablet (81 mg total) by mouth daily.  . busPIRone (BUSPAR) 15 MG tablet Take 15 mg by mouth 2 (two) times daily.   . cyclobenzaprine (FLEXERIL) 10 MG tablet TK 1 T PO TID  . DULoxetine (CYMBALTA) 60 MG capsule Take by mouth.  . furosemide (LASIX) 20 MG tablet Take 20 mg by mouth 2 (two) times daily. Takes 2 in am and 1 at lunch  . gabapentin (NEURONTIN) 300 MG capsule Take 300 mg by mouth 2 (two) times daily. Takes 1 in am and 2 in pm  . levothyroxine (SYNTHROID, LEVOTHROID) 50 MCG tablet Take 50 mcg by mouth daily before breakfast.  . mometasone-formoterol (DULERA) 200-5 MCG/ACT AERO   . montelukast (SINGULAIR) 10 MG tablet Take 10 mg by mouth at bedtime.  Marland Kitchen omeprazole (PRILOSEC) 20 MG capsule Take by mouth.  . potassium chloride (K-DUR) 10 MEQ tablet Take by mouth daily.  Marland Kitchen PROAIR HFA 108 (90 Base) MCG/ACT inhaler INHALE 2 PUFFS BY MOUTH EVERY 4 TO 6 HOURS AS NEEDED  . rosuvastatin (CRESTOR) 40 MG tablet Take 40 mg by mouth daily.  Marland Kitchen topiramate (TOPAMAX) 50 MG tablet TAKE 1 TABLET BY MOUTH IN THE MORNING FOR 7 DAYS THEN TAKE 1 TABLET BY MOUTH TWICE DAILY  . warfarin (COUMADIN) 5 MG tablet Take 1.5 - 2 tablets daily as directed by Coumadin clinic     Allergies:   Hydrocodone-acetaminophen; Hydromorphone; Other; Oxycodone; Penicillins; and Dilaudid [hydromorphone hcl]   Social  History   Socioeconomic History  . Marital status: Married    Spouse name: Not on file  . Number of children: 2  . Years of education: Not on file  . Highest education level: Not on file  Occupational History  . Occupation: disabled  . Occupation: Curator  Social Needs  . Financial resource strain: Not on file  . Food insecurity:    Worry: Not on file    Inability: Not on file  . Transportation needs:    Medical: Not on file    Non-medical: Not on file  Tobacco Use  . Smoking status: Current Every Day Smoker    Packs/day: 2.50    Types: Cigarettes    Start date: 04/21/2012    Last attempt to quit: 04/21/2012    Years since quitting: 5.8  . Smokeless tobacco: Never Used  Substance and Sexual Activity  . Alcohol use: Yes  . Drug use: No    Comment: previous crack cocaine "quit 4 years ago"  . Sexual activity: Yes  Lifestyle  . Physical activity:    Days per week: Not on file    Minutes per session: Not on file  . Stress: Not on file  Relationships  . Social connections:    Talks on phone: Not on file    Gets together: Not on file    Attends religious service: Not on file    Active member of club or organization: Not on file    Attends meetings of clubs or organizations: Not on file    Relationship status: Not on file  Other Topics Concern  . Not on file  Social History Narrative  . Not on file     Family History: The patient's family history includes Diabetes in his father; Hypertension in his father; Lung disease in his mother; Peripheral Artery Disease in his father. ROS:   Please see the history of present illness.    All 14 point review of systems negative except as described per history of present illness  EKGs/Labs/Other Studies Reviewed:      Recent Labs: 12/10/2017: BUN 11; Creatinine, Ser 1.12; Potassium 4.9; Sodium 140  Recent Lipid Panel    Component Value Date/Time   CHOL 114 05/08/2012 0420   TRIG 54 05/08/2012 0420   HDL 34 (L) 05/08/2012  0420   CHOLHDL 3.4 05/08/2012 0420   VLDL 11 05/08/2012 0420   LDLCALC 69 05/08/2012 0420    Physical Exam:    VS:  BP 122/78   Pulse 66   Ht 5\' 11"  (1.803 m)   Wt 241 lb 9.6 oz (109.6 kg)   SpO2 94%   BMI 33.70 kg/m     Wt Readings from Last 3 Encounters:  03/13/18 241 lb 9.6 oz (109.6 kg)  01/29/18 236 lb 1.9 oz (107.1 kg)  12/10/17 233 lb (105.7 kg)     GEN:  Well nourished, well developed in no acute distress HEENT: Normal NECK: No JVD; No carotid bruits LYMPHATICS: No lymphadenopathy CARDIAC: RRR, no murmurs, no rubs, no gallops RESPIRATORY:  Clear to auscultation without rales, wheezing or rhonchi  ABDOMEN: Soft, non-tender, non-distended MUSCULOSKELETAL:  No edema; No deformity  SKIN: Warm and dry LOWER EXTREMITIES: no swelling NEUROLOGIC:  Alert and oriented x 3 PSYCHIATRIC:  Normal affect   ASSESSMENT:    1. Aortic aneurysm and dissection (HCC)   2. History of CVA (cerebrovascular accident)   3. Presence of prosthetic heart valve   4. Paroxysmal atrial fibrillation (HCC)   5. Syncope, unspecified syncope type    PLAN:    In order of problems listed above:  1. Syncope very concerning he does have right bundle branch with left anterior hemiblock and first-degree AV block.  He did wear event recorder for a month however only 2 weeks of monitoring was available and he did not have any symptoms during the time.  I will refer him to EP service for consideration of future evaluation and possibly even pacemaker implantation. 2. Paroxysmal atrial fibrillation quite frequent episodes still on amiodarone which I will continue. 3. Status post aortic valve replacement and I will ask him to have an echocardiogram may need to look at left ventricular ejection fraction. 4. Status post CVA stable noted continue anticoagulation. 5. Chronic Coumadin therapy.  Today's INR is 5.0 we will hold Coumadin and adjust accordingly.   Medication Adjustments/Labs and Tests  Ordered: Current medicines are reviewed at length with the patient today.  Concerns regarding medicines are outlined above.  No orders of the defined types were placed  in this encounter.  Medication changes: No orders of the defined types were placed in this encounter.   Signed, Georgeanna Lea, MD, [facility] 03/13/2018 9:13 AM    [facility]

## 2018-03-13 NOTE — Patient Instructions (Signed)
Medication Instructions:  Your physician recommends that you continue on your current medications as directed. Please refer to the Current Medication list given to you today.  If you need a refill on your cardiac medications before your next appointment, please call your pharmacy.   Lab work: None.  If you have labs (blood work) drawn today and your tests are completely normal, you will receive your results only by: Marland Kitchen MyChart Message (if you have MyChart) OR . A paper copy in the mail If you have any lab test that is abnormal or we need to change your treatment, we will call you to review the results.  Testing/Procedures: Your physician has requested that you have an echocardiogram. Echocardiography is a painless test that uses sound waves to create images of your heart. It provides your doctor with information about the size and shape of your heart and how well your heart's chambers and valves are working. This procedure takes approximately one hour. There are no restrictions for this procedure.    Follow-Up: At [facility], you and your health needs are our priority.  As part of our continuing mission to provide you with exceptional heart care, we have created designated Provider Care Teams.  These Care Teams include your primary Cardiologist (physician) and Advanced Practice Providers (APPs -  Physician Assistants and Nurse Practitioners) who all work together to provide you with the care you need, when you need it. You will need a follow up appointment in 2 months.  Please call our office 2 months in advance to schedule this appointment.  You may see No primary care provider on file.= or another member of our [employer] Provider Team in [place]: Norman Herrlich, MD . Belva Crome, MD  Any Other Special Instructions Will Be Listed Below (If Applicable).  Dr. Bing Matter has referred you to see Dr. Elberta Fortis with electrophysiology for atrial fibrillation and syncope.    Echocardiogram An echocardiogram, or echocardiography, uses sound waves (ultrasound) to produce an image of your heart. The echocardiogram is simple, painless, obtained within a short period of time, and offers valuable information to your health care provider. The images from an echocardiogram can provide information such as:  Evidence of coronary artery disease (CAD).  Heart size.  Heart muscle function.  Heart valve function.  Aneurysm detection.  Evidence of a past heart attack.  Fluid buildup around the heart.  Heart muscle thickening.  Assess heart valve function.  Tell a health care provider about:  Any allergies you have.  All medicines you are taking, including vitamins, herbs, eye drops, creams, and over-the-counter medicines.  Any problems you or family members have had with anesthetic medicines.  Any blood disorders you have.  Any surgeries you have had.  Any medical conditions you have.  Whether you are pregnant or may be pregnant. What happens before the procedure? No special preparation is needed. Eat and drink normally. What happens during the procedure?  In order to produce an image of your heart, gel will be applied to your chest and a wand-like tool (transducer) will be moved over your chest. The gel will help transmit the sound waves from the transducer. The sound waves will harmlessly bounce off your heart to allow the heart images to be captured in real-time motion. These images will then be recorded.  You may need an IV to receive a medicine that improves the quality of the pictures. What happens after the procedure? You may return to your normal schedule including diet, activities,  and medicines, unless your health care provider tells you otherwise. This information is not intended to replace advice given to you by your health care provider. Make sure you discuss any questions you have with your health care provider. Document Released:  04/20/2000 Document Revised: 12/10/2015 Document Reviewed: 12/29/2012 Elsevier Interactive Patient Education  2017 [employer].

## 2018-03-13 NOTE — Patient Instructions (Signed)
Description   Hold today's dose and take 5 mg tomorrow then continue 10mg  everyday. Recheck INR in 1 week.

## 2018-03-25 ENCOUNTER — Ambulatory Visit (INDEPENDENT_AMBULATORY_CARE_PROVIDER_SITE_OTHER): Payer: Medicaid Other | Admitting: Pharmacist

## 2018-03-25 DIAGNOSIS — I6330 Cerebral infarction due to thrombosis of unspecified cerebral artery: Secondary | ICD-10-CM

## 2018-03-25 DIAGNOSIS — I480 Paroxysmal atrial fibrillation: Secondary | ICD-10-CM

## 2018-03-25 DIAGNOSIS — Z952 Presence of prosthetic heart valve: Secondary | ICD-10-CM | POA: Diagnosis not present

## 2018-03-25 DIAGNOSIS — I359 Nonrheumatic aortic valve disorder, unspecified: Secondary | ICD-10-CM | POA: Diagnosis not present

## 2018-03-25 LAB — POCT INR: INR: 2.6 (ref 2.0–3.0)

## 2018-03-25 NOTE — Patient Instructions (Signed)
Description   Continue 10mg  everyday. Recheck INR in 3 weeks.

## 2018-03-27 ENCOUNTER — Telehealth: Payer: Self-pay | Admitting: Cardiology

## 2018-03-27 MED ORDER — AMIODARONE HCL 200 MG PO TABS: 200.0000 mg | ORAL_TABLET | Freq: Every day | ORAL | 1 refills | Status: DC

## 2018-03-27 NOTE — Telephone Encounter (Signed)
FYI

## 2018-03-27 NOTE — Telephone Encounter (Signed)
Called pt per staff message to get him in sooner than [place] in [place]. Offered 12-4 in [place], pt declined, only wants [place]. Pt's became more hard to understand as conversation went on. He did say his BP after walking yesterday was 189/125 pulse 41. Pt requesting nurse call [phone]

## 2018-03-27 NOTE — Telephone Encounter (Signed)
Patient's mother also informed of monitor results and increased medication amiodarone to 200 mg daily.

## 2018-03-27 NOTE — Telephone Encounter (Signed)
Spoke with patient's mother per dpr. She reports patient walked 1/2 mile yesterday and afterwards his blood pressure was high and her heart rate low. She reports that his blood pressure and heart rate returned to normal quickly after this. I have asked her to have patient monitor this and let us know if these abnormal readings return. She verbally understands. They will contact us with concerns

## 2018-04-14 ENCOUNTER — Institutional Professional Consult (permissible substitution): Payer: Medicaid Other | Admitting: Cardiology

## 2018-05-01 ENCOUNTER — Ambulatory Visit (INDEPENDENT_AMBULATORY_CARE_PROVIDER_SITE_OTHER): Payer: Medicaid Other

## 2018-05-01 DIAGNOSIS — I7100 Dissection of unspecified site of aorta: Secondary | ICD-10-CM | POA: Diagnosis not present

## 2018-05-01 NOTE — Progress Notes (Signed)
Complete echocardiogram has been performed.  Jimmy Ura Hausen RDCS, RVT 

## 2018-05-06 ENCOUNTER — Other Ambulatory Visit: Payer: Self-pay | Admitting: Cardiology

## 2018-05-13 ENCOUNTER — Encounter: Payer: Self-pay | Admitting: Cardiology

## 2018-05-13 ENCOUNTER — Ambulatory Visit (INDEPENDENT_AMBULATORY_CARE_PROVIDER_SITE_OTHER): Payer: Medicaid Other | Admitting: Cardiology

## 2018-05-13 VITALS — BP 142/90 | HR 57 | Ht 71.0 in | Wt 243.6 lb

## 2018-05-13 DIAGNOSIS — I711 Thoracic aortic aneurysm, ruptured, unspecified: Secondary | ICD-10-CM

## 2018-05-13 DIAGNOSIS — I6990 Unspecified sequelae of unspecified cerebrovascular disease: Secondary | ICD-10-CM

## 2018-05-13 DIAGNOSIS — I480 Paroxysmal atrial fibrillation: Secondary | ICD-10-CM | POA: Diagnosis not present

## 2018-05-13 DIAGNOSIS — R55 Syncope and collapse: Secondary | ICD-10-CM | POA: Diagnosis not present

## 2018-05-13 DIAGNOSIS — N529 Male erectile dysfunction, unspecified: Secondary | ICD-10-CM | POA: Diagnosis not present

## 2018-05-13 NOTE — Patient Instructions (Signed)
Medication Instructions:  Your physician recommends that you continue on your current medications as directed. Please refer to the Current Medication list given to you today.  If you need a refill on your cardiac medications before your next appointment, please call your pharmacy.   Lab work: None ordered If you have labs (blood work) drawn today and your tests are completely normal, you will receive your results only by: Marland Kitchen. MyChart Message (if you have MyChart) OR . A paper copy in the mail If you have any lab test that is abnormal or we need to change your treatment, we will call you to review the results.  Testing/Procedures: None   Follow-Up: At [facility], you and your health needs are our priority.  As part of our continuing mission to provide you with exceptional heart care, we have created designated Provider Care Teams.  These Care Teams include your primary Cardiologist (physician) and Advanced Practice Providers (APPs -  Physician Assistants and Nurse Practitioners) who all work together to provide you with the care you need, when you need it. You will need a follow up appointment in 2 months.   You may see Gypsy Balsamobert Krasowski or another member of our [facility] Provider Team in [place]: Norman HerrlichBrian Munley, MD . Belva Cromeajan Revankar, MD  Any Other Special Instructions Will Be Listed Below (If Applicable).

## 2018-05-13 NOTE — Progress Notes (Signed)
Cardiology Office Note:    Date:  05/13/2018   ID:  Jon [name], DOB 1965/12/09, MRN [id number]  PCP:  Guadalupe Maple., MD  Cardiologist:  Gypsy Balsam, MD    Referring MD: Guadalupe Maple., MD   Chief Complaint  Patient presents with  . Follow-up  Am doing well  History of Present Illness:    Jon [name] is a 53 y.o. male with complex past medical history years ago he ended up having aortic dissection surgery was done.  He also required aortic valve replacement however surgery was complicated by CVA.  I have been following him for years however lately he started experiencing episode of syncope.  Echocardiogram was done which showed no potential explanation for his syncope.  Monitor will show recurrent episode of atrial fibrillation he does have high history of proximal atrial fibrillation in spite of being on amiodarone.  He was referred to EP team and we waiting for the evaluation he is scheduled to have appointment on 20th of this month.  He comes today to my office described to have some dizziness but no passing out he want me to give him some medication to help with erectile dysfunction I prefer not to do it until the issue of his passing out will be clarified.  Denies have any chest pain tightness squeezing pressure burning chest  Past Medical History:  Diagnosis Date  . Aneurysm of thoracic aorta (HCC) 05/06/2012   IMO routine update IMO routine update  . Anxiety    sees Dr. Renae Fickle  . Aortic aneurysm and dissection (HCC) 05/06/2012  . Aortic stenosis    mechanical AVR 04/22/12  . Aortic valve disorder 05/06/2012  . 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 Bicuspid aortic valve   . Cerebral infarction (HCC) 05/11/2012  . Cerebral thrombosis with cerebral infarction (HCC) 07/14/2013  . Cholelithiasis 12/20/2016  . Current use of long term anticoagulation 12/20/2016  . CVA (cerebral infarction)    occipital CVA 12/13  . Dissecting aortic  aneurysm (HCC) 05/06/2012  . Dysarthria 09/10/2017  . Dysrhythmia    sees Dr. Geronimo Running, [facility] cardiology in [place]  . Generalized constriction of visual field 10/15/2012   IMOUPDATE IMOUPDATE  . 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 GERD (gastroesophageal reflux disease)   . HCAP (healthcare-associated pneumonia) 05/06/2012  . Headache(784.0)    "due to vision problem"  . History of CVA (cerebrovascular accident) 05/07/2012  . Hypothyroidism   . Idiopathic peripheral neuropathy 07/14/2013  . Late effects of CVA (cerebrovascular accident) 08/08/2015  . Nontraumatic multiple localized intracerebral hemorrhages (HCC) 10/16/2017  . Paroxysmal atrial fibrillation (HCC) 08/08/2015   On warfarin On warfarin  . Presence of prosthetic heart valve 08/08/2015  . Shortness of breath   . Stroke (HCC)    x3  . Stroke, embolic (HCC) 05/07/2012  . Syncope 05/06/2012  . Thoracic aortic aneurysm (HCC)    replacement aortic root 04/22/12  . VBI (vertebrobasilar insufficiency) 09/10/2017    Past Surgical History:  Procedure Laterality Date  . APPLICATION OF WOUND VAC  06/11/2012   Procedure: APPLICATION OF WOUND VAC;  Surgeon: [name], MD;  Location: [facility] OR;  Service: Vascular;  Laterality: Right;  . CARDIAC CATHETERIZATION    . CHOLECYSTECTOMY    . CORONARY ARTERY BYPASS GRAFT  04/22/2012   Procedure: CORONARY ARTERY BYPASS GRAFTING (CABG);  Surgeon: [name], MD;  Location: [facility] OR;  Service: Open Heart Surgery;  Laterality: N/A;  times one with right greater saphenous vein graft  . I&D EXTREMITY  06/11/2012   Procedure: IRRIGATION AND DEBRIDEMENT EXTREMITY;  Surgeon: Loreli SlotSteven C Hendrickson, MD;  [facility];  Service: Vascular;  Laterality: Right;  . INTRAOPERATIVE TRANSESOPHAGEAL ECHOCARDIOGRAM  04/22/2012   Procedure: INTRAOPERATIVE TRANSESOPHAGEAL ECHOCARDIOGRAM;  Surgeon: Loreli SlotSteven C Hendrickson, MD;  [facility];  Service: Open Heart Surgery;  Laterality: N/A;    . ROTATOR CUFF REPAIR     12 years ago, Left  . THORACIC AORTIC ANEURYSM REPAIR  04/22/2012   Procedure: THORACIC ASCENDING ANEURYSM REPAIR (AAA);  Surgeon: Loreli SlotSteven C Hendrickson, MD;  [facility];  Service: Open Heart Surgery;  Laterality: N/A;    Current Medications: Current Meds  Medication Sig  . acetaminophen (TYLENOL) 500 MG tablet Take by mouth.  Marland Kitchen. amiodarone (PACERONE) 200 MG tablet Take 1 tablet (200 mg total) by mouth daily.  Marland Kitchen. aspirin 81 MG chewable tablet Chew 1 tablet (81 mg total) by mouth daily.  . busPIRone (BUSPAR) 15 MG tablet Take 15 mg by mouth 2 (two) times daily.   . cyclobenzaprine (FLEXERIL) 10 MG tablet TK 1 T PO TID  . DULoxetine (CYMBALTA) 60 MG capsule Take by mouth.  . furosemide (LASIX) 20 MG tablet Take 20 mg by mouth 2 (two) times daily. Takes 2 in am and 1 at lunch  . gabapentin (NEURONTIN) 300 MG capsule Take 300 mg by mouth 2 (two) times daily. Takes 1 in am and 2 in pm  . levothyroxine (SYNTHROID, LEVOTHROID) 50 MCG tablet Take 50 mcg by mouth daily before breakfast.  . mometasone-formoterol (DULERA) 200-5 MCG/ACT AERO   . montelukast (SINGULAIR) 10 MG tablet Take 10 mg by mouth at bedtime.  Marland Kitchen. omeprazole (PRILOSEC) 20 MG capsule Take by mouth.  . potassium chloride (K-DUR) 10 MEQ tablet Take by mouth daily.  Marland Kitchen. PROAIR HFA 108 (90 Base) MCG/ACT inhaler INHALE 2 PUFFS BY MOUTH EVERY 4 TO 6 HOURS AS NEEDED  . rosuvastatin (CRESTOR) 40 MG tablet Take 40 mg by mouth daily.  Marland Kitchen. topiramate (TOPAMAX) 50 MG tablet TAKE 1 TABLET BY MOUTH IN THE MORNING FOR 7 DAYS THEN TAKE 1 TABLET BY MOUTH TWICE DAILY  . warfarin (COUMADIN) 5 MG tablet TAKE 1 & 1/2 TO 2 TABS BY MOUTH DAILY AS DIRECTED BY COUMADIN CLINIC     Allergies:   Hydrocodone-acetaminophen; Hydromorphone; Other; Oxycodone; Penicillins; and Dilaudid [hydromorphone hcl]   Social History   Socioeconomic History  . Marital status: Married    Spouse name: Not on file  . Number of children: 2  . Years  of education: Not on file  . Highest education level: Not on file  Occupational History  . Occupation: disabled  . Occupation: [employer]  Social Needs  . Financial resource strain: Not on file  . Food insecurity:    Worry: Not on file    Inability: Not on file  . Transportation needs:    Medical: Not on file    Non-medical: Not on file  Tobacco Use  . Smoking status: Current Every Day Smoker    Packs/day: 2.50    Types: Cigarettes    Start date: 04/21/2012    Last attempt to quit: 04/21/2012    Years since quitting: 6.0  . Smokeless tobacco: Never Used  Substance and Sexual Activity  . Alcohol use: Yes  . Drug use: No    Comment: previous crack cocaine "quit 4 years ago"  . Sexual activity: Yes  Lifestyle  . Physical  activity:    Days per week: Not on file    Minutes per session: Not on file  . Stress: Not on file  Relationships  . Social connections:    Talks on phone: Not on file    Gets together: Not on file    Attends religious service: Not on file    Active member of club or organization: Not on file    Attends meetings of clubs or organizations: Not on file    Relationship status: Not on file  Other Topics Concern  . Not on file  Social History Narrative  . Not on file     Family History: The patient's family history includes Diabetes in his father; Hypertension in his father; Lung disease in his mother; Peripheral Artery Disease in his father. ROS:   Please see the history of present illness.    All 14 point review of systems negative except as described per history of present illness  EKGs/Labs/Other Studies Reviewed:      Recent Labs: 12/10/2017: BUN 11; Creatinine, Ser 1.12; Potassium 4.9; Sodium 140  Recent Lipid Panel    Component Value Date/Time   CHOL 114 05/08/2012 0420   TRIG 54 05/08/2012 0420   HDL 34 (L) 05/08/2012 0420   CHOLHDL 3.4 05/08/2012 0420   VLDL 11 05/08/2012 0420   LDLCALC 69 05/08/2012 0420    Physical Exam:    VS:  BP  (!) 142/90   Pulse (!) 57   Ht 5\' 11"  (1.803 m)   Wt 243 lb 9.6 oz (110.5 kg)   SpO2 96%   BMI 33.98 kg/m     Wt Readings from Last 3 Encounters:  05/13/18 243 lb 9.6 oz (110.5 kg)  03/13/18 241 lb 9.6 oz (109.6 kg)  01/29/18 236 lb 1.9 oz (107.1 kg)     GEN:  Well nourished, well developed in no acute distress HEENT: Normal NECK: No JVD; No carotid bruits LYMPHATICS: No lymphadenopathy CARDIAC: RRR, crisp mechanical valve sounds, systolic murmur grade 1/6 best heard right upper portion of the sternum, no rubs, no gallops RESPIRATORY:  Clear to auscultation without rales, wheezing or rhonchi  ABDOMEN: Soft, non-tender, non-distended MUSCULOSKELETAL:  No edema; No deformity  SKIN: Warm and dry LOWER EXTREMITIES: no swelling NEUROLOGIC:  Alert and oriented x 3 PSYCHIATRIC:  Normal affect   ASSESSMENT:    1. Ruptured aneurysm of thoracic aorta (HCC)   2. Paroxysmal atrial fibrillation (HCC)   3. Syncope, unspecified syncope type   4. Vasculogenic erectile dysfunction, unspecified vasculogenic erectile dysfunction type   5. Late effects of CVA (cerebrovascular accident)    PLAN:    In order of problems listed above:  1. Status post aortic valve replacement: Last echocardiogram done on 26 December showed normal function of the valve.  Ejection fraction was also normal.  Continue anticoagulation 2. Paroxysmal atrial fibrillation in spite of amiodarone.  Referral to EP team. 3. Syncope with intraventricular conduction delay on the EKG again referral to EP he is waiting for consultation. 4. Erectile dysfunction will not initiate any therapy until issue of his and passing out will be clarified. 5. Late effect of CVA noted no new changes   Medication Adjustments/Labs and Tests Ordered: Current medicines are reviewed at length with the patient today.  Concerns regarding medicines are outlined above.  No orders of the defined types were placed in this encounter.  Medication  changes: No orders of the defined types were placed in this encounter.   Signed, Molly Maduro  Abelino Derrick, MD, [facility] 05/13/2018 9:29 AM    [facility]

## 2018-05-14 ENCOUNTER — Encounter: Payer: Self-pay | Admitting: *Deleted

## 2018-05-20 ENCOUNTER — Ambulatory Visit: Payer: Medicaid Other | Admitting: *Deleted

## 2018-05-20 DIAGNOSIS — I359 Nonrheumatic aortic valve disorder, unspecified: Secondary | ICD-10-CM

## 2018-05-20 DIAGNOSIS — I6330 Cerebral infarction due to thrombosis of unspecified cerebral artery: Secondary | ICD-10-CM

## 2018-05-20 DIAGNOSIS — Z952 Presence of prosthetic heart valve: Secondary | ICD-10-CM

## 2018-05-20 DIAGNOSIS — I480 Paroxysmal atrial fibrillation: Secondary | ICD-10-CM

## 2018-05-20 LAB — POCT INR: INR: 2.4 (ref 2.0–3.0)

## 2018-05-20 NOTE — Patient Instructions (Signed)
Description   Today take 12.5mg , then Continue 10mg  everyday. Recheck INR in 2 weeks. Call us with any new  medications or concerns # [phone].

## 2018-05-22 ENCOUNTER — Telehealth: Payer: Self-pay | Admitting: Emergency Medicine

## 2018-05-22 NOTE — Telephone Encounter (Signed)
Clearance faxed to [facility] with successful confirmation for dental procedure.

## 2018-05-26 ENCOUNTER — Encounter: Payer: Self-pay | Admitting: Cardiology

## 2018-05-26 ENCOUNTER — Ambulatory Visit: Payer: Medicaid Other | Admitting: Cardiology

## 2018-05-26 ENCOUNTER — Encounter: Payer: Self-pay | Admitting: *Deleted

## 2018-05-26 VITALS — BP 128/70 | HR 62 | Ht 71.0 in | Wt 248.0 lb

## 2018-05-26 DIAGNOSIS — I480 Paroxysmal atrial fibrillation: Secondary | ICD-10-CM | POA: Diagnosis not present

## 2018-05-26 DIAGNOSIS — R55 Syncope and collapse: Secondary | ICD-10-CM

## 2018-05-26 NOTE — Patient Instructions (Signed)
Medication Instructions:  Your physician recommends that you continue on your current medications as directed. Please refer to the Current Medication list given to you today.  * If you need a refill on your cardiac medications before your next appointment, please call your pharmacy.   Labwork: None ordered  Testing/Procedures: None ordered  Follow-Up: To be determined  Thank you for choosing [facility]!!   Dory Horn, RN [phone]  Any Other Special Instructions Will Be Listed Below (If Applicable).  Please call the office if you decide to have the loop recorder implanted. Dory Horn, RN   [phone]    Implantable Loop Recorder Placement  An implantable loop recorder is a small electronic device that is placed under the skin of your chest. It is about the size of an AA ("double A") battery. The device records the electrical activity of your heart over a long period of time. Your health care provider can download these recordings to monitor your heart. You may need an implantable loop recorder if you have periods of abnormal heart activity (arrhythmias) or unexplained fainting (syncope). The recorder can be left in place for 1 year or longer. Tell a health care provider about:  Any allergies you have.  All medicines you are taking, including vitamins, herbs, eye drops, creams, and over-the-counter medicines.  Any problems you or family members have had with anesthetic medicines.  Any blood disorders you have.  Any surgeries you have had.  Any medical conditions you have.  Whether you are pregnant or may be pregnant. What are the risks? Generally, this is a safe procedure. However, problems may occur, including:  Infection.  Bleeding.  Allergic reactions to anesthetic medicines.  Damage to nerves or blood vessels.  Failure of the device to work. This could require another surgery to replace it. What happens before the procedure?   You may  have a physical exam, blood tests, and imaging tests of your heart, such as a chest X-ray.  Follow instructions from your health care provider about eating or drinking restrictions.  Ask your health care provider about: ? Changing or stopping your regular medicines. This is especially important if you are taking diabetes medicines or blood thinners. ? Taking medicines such as aspirin and ibuprofen. These medicines can thin your blood. Do not take these medicines unless your health care provider tells you to take them. ? Taking over-the-counter medicines, vitamins, herbs, and supplements.  Ask your health care provider how your surgical site will be marked or identified.  Ask your health care provider what steps will be taken to help prevent infection. These may include: ? Removing hair at the surgery site. ? Washing skin with a germ-killing soap.  Plan to have someone take you home from the hospital or clinic.  Plan to have a responsible adult care for you for at least 24 hours after you leave the hospital or clinic. This is important.  Do not use any products that contain nicotine or tobacco, such as cigarettes and e-cigarettes. If you need help quitting, ask your health care provider. What happens during the procedure?  An IV will be inserted into one of your veins.  You may be given one or more of the following: ? A medicine to help you relax (sedative). ? A medicine to numb the area (local anesthetic).  A small incision will be made on the left side of your upper chest.  A pocket will be created under your skin.  The device will be  placed in the pocket.  The incision will be closed with stitches (sutures) or adhesive strips.  A bandage (dressing) will be placed over the incision. The procedure may vary among health care providers and hospitals. What happens after the procedure?  Your blood pressure, heart rate, breathing rate, and blood oxygen level will be monitored until  you leave the hospital or clinic.  You may be able to go home on the day of your surgery. Before you go home: ? Your health care provider will program your recorder. ? You will learn how to trigger your device with a handheld activator. ? You will learn how to send recordings to your health care provider. ? You will get an ID card for your device, and you will be told when to use it.  Do not drive for 24 hours if you were given a sedative during your procedure. Summary  An implantable loop recorder is a small electronic device that is placed under the skin of your chest to monitor your heart over a long period of time.  The recorder can be left in place for 1 year or longer.  Plan to have someone take you home from the hospital or clinic. This information is not intended to replace advice given to you by your health care provider. Make sure you discuss any questions you have with your health care provider. Document Released: 04/04/2015 Document Revised: 06/08/2017 Document Reviewed: 06/08/2017 Elsevier Interactive Patient Education  2019 [employer]

## 2018-05-26 NOTE — Progress Notes (Signed)
Electrophysiology Office Note   Date:  05/26/2018   ID:  Abraheem, Marck 1965/06/18, MRN [id number]  PCP:  Guadalupe Maple., MD  Cardiologist:  Bing Matter Primary Electrophysiologist:  Nikhita Mentzel Jorja Loa, MD    No chief complaint on file.    History of Present Illness: Jon [name] is a 53 y.o. male who is being seen today for the evaluation of atrial fibrillation at the request of Gypsy Balsam. Presenting today for electrophysiology evaluation.  He has a history of aortic dissection.  His operation was complicated by a CVA.  He has been having episodes of syncope.  He wore a cardiac monitor that showed episodes of atrial fibrillation despite being on amiodarone.  He has had multiple episodes of syncope.  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  His episodes occur, what sounds to be, quite suddenly, though his history is difficult to distinguish due to his strokes.  Today, he denies symptoms of palpitations, chest pain, shortness of breath, orthopnea, PND, lower extremity edema, claudication, dizziness, presyncope, bleeding, or neurologic sequela. The patient is tolerating medications without difficulties.    Past Medical History:  Diagnosis Date  . Aneurysm of thoracic aorta (HCC) 05/06/2012   IMO routine update IMO routine update  . Anxiety    sees Dr. Renae Fickle  . Aortic aneurysm and dissection (HCC) 05/06/2012  . Aortic stenosis    mechanical AVR 04/22/12  . Aortic valve disorder 05/06/2012  . 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 Bicuspid aortic valve   . Cerebral infarction (HCC) 05/11/2012  . Cerebral thrombosis with cerebral infarction (HCC) 07/14/2013  . Cholelithiasis 12/20/2016  . Current use of long term anticoagulation 12/20/2016  . CVA (cerebral infarction)    occipital CVA 12/13  . Dissecting aortic aneurysm (HCC) 05/06/2012  . Dysarthria 09/10/2017  .  Dysrhythmia    sees Dr. Geronimo Running, [facility] cardiology in [place]  . Generalized constriction of visual field 10/15/2012   IMOUPDATE IMOUPDATE  . 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 GERD (gastroesophageal reflux disease)   . HCAP (healthcare-associated pneumonia) 05/06/2012  . Headache(784.0)    "due to vision problem"  . History of CVA (cerebrovascular accident) 05/07/2012  . Hypothyroidism   . Idiopathic peripheral neuropathy 07/14/2013  . Late effects of CVA (cerebrovascular accident) 08/08/2015  . Nontraumatic multiple localized intracerebral hemorrhages (HCC) 10/16/2017  . Paroxysmal atrial fibrillation (HCC) 08/08/2015   On warfarin On warfarin  . Presence of prosthetic heart valve 08/08/2015  . Shortness of breath   . Stroke (HCC)    x3  . Stroke, embolic (HCC) 05/07/2012  . Syncope 05/06/2012  . Thoracic aortic aneurysm (HCC)    replacement aortic root 04/22/12  . VBI (vertebrobasilar insufficiency) 09/10/2017   Past Surgical History:  Procedure Laterality Date  . APPLICATION OF WOUND VAC  06/11/2012   Procedure: APPLICATION OF WOUND VAC;  Surgeon: [name], MD;  Location: [facility] OR;  Service: Vascular;  Laterality: Right;  . CARDIAC CATHETERIZATION    . CHOLECYSTECTOMY    . CORONARY ARTERY BYPASS GRAFT  04/22/2012   Procedure: CORONARY ARTERY BYPASS GRAFTING (CABG);  Surgeon: [name], MD;  Location: [facility] OR;  Service: Open Heart Surgery;  Laterality: N/A;  times one with right greater saphenous vein graft  . I&D EXTREMITY  06/11/2012   Procedure: IRRIGATION AND DEBRIDEMENT EXTREMITY;  Surgeon: [name], MD;  Location: [facility] OR;  Service: Vascular;  Laterality: Right;  . INTRAOPERATIVE TRANSESOPHAGEAL ECHOCARDIOGRAM  04/22/2012   Procedure: INTRAOPERATIVE TRANSESOPHAGEAL ECHOCARDIOGRAM;  Surgeon: Loreli SlotSteven C Hendrickson, MD;  [facility];  Service: Open Heart Surgery;  Laterality: N/A;  . ROTATOR CUFF REPAIR     12 years ago, Left  .  THORACIC AORTIC ANEURYSM REPAIR  04/22/2012   Procedure: THORACIC ASCENDING ANEURYSM REPAIR (AAA);  Surgeon: Loreli SlotSteven C Hendrickson, MD;  [facility];  Service: Open Heart Surgery;  Laterality: N/A;     Current Outpatient Medications  Medication Sig Dispense Refill  . acetaminophen (TYLENOL) 500 MG tablet Take by mouth.    Marland Kitchen. amiodarone (PACERONE) 200 MG tablet Take 1 tablet (200 mg total) by mouth daily. 90 tablet 1  . aspirin 81 MG chewable tablet Chew 1 tablet (81 mg total) by mouth daily.    . busPIRone (BUSPAR) 15 MG tablet Take 15 mg by mouth 2 (two) times daily.     . clindamycin (CLEOCIN) 300 MG capsule Take 300 mg by mouth 3 (three) times daily.    . cyclobenzaprine (FLEXERIL) 10 MG tablet TK 1 T PO TID  0  . DULoxetine (CYMBALTA) 60 MG capsule Take by mouth.    . furosemide (LASIX) 20 MG tablet Take 20 mg by mouth 2 (two) times daily. Takes 2 in am and 1 at lunch    . gabapentin (NEURONTIN) 300 MG capsule Take 300 mg by mouth 2 (two) times daily. Takes 1 in am and 2 in pm    . ibuprofen (ADVIL,MOTRIN) 600 MG tablet Take 600 mg by mouth every 8 (eight) hours.    Marland Kitchen. levothyroxine (SYNTHROID, LEVOTHROID) 50 MCG tablet Take 50 mcg by mouth daily before breakfast.    . mometasone-formoterol (DULERA) 200-5 MCG/ACT AERO     . montelukast (SINGULAIR) 10 MG tablet Take 10 mg by mouth at bedtime.    Marland Kitchen. omeprazole (PRILOSEC) 20 MG capsule Take by mouth.    . potassium chloride (K-DUR) 10 MEQ tablet Take by mouth daily.    Marland Kitchen. PROAIR HFA 108 (90 Base) MCG/ACT inhaler INHALE 2 PUFFS BY MOUTH EVERY 4 TO 6 HOURS AS NEEDED  12  . rosuvastatin (CRESTOR) 40 MG tablet Take 40 mg by mouth daily.  12  . topiramate (TOPAMAX) 50 MG tablet TAKE 1 TABLET BY MOUTH IN THE MORNING FOR 7 DAYS THEN TAKE 1 TABLET BY MOUTH TWICE DAILY  5  . warfarin (COUMADIN) 5 MG tablet TAKE 1 & 1/2 TO 2 TABS BY MOUTH DAILY AS DIRECTED BY COUMADIN CLINIC 65 tablet 2   No current facility-administered medications for this visit.      Allergies:   Hydrocodone-acetaminophen; Hydromorphone; Other; Oxycodone; Penicillins; and Dilaudid [hydromorphone hcl]   Social History:  The patient  reports that he has been smoking cigarettes. He started smoking about 6 years ago. He has been smoking about 2.50 packs per day. He has never used smokeless tobacco. He reports current alcohol use. He reports that he does not use drugs.   Family History:  The patient's family history includes CAD in his father; Cancer in his maternal grandfather; Diabetes in his father; Heart disease in his father and paternal grandfather; Hypertension in his father; Lung disease in his mother; Peripheral Artery Disease in his father; Stroke in his father.    ROS:  Please see the history of present illness.   Otherwise, review of systems is positive for none.   All other systems are reviewed and negative.    PHYSICAL  EXAM: VS:  BP 128/70   Pulse 62   Ht 5\' 11"  (1.803 m)   Wt 248 lb (112.5 kg)   BMI 34.59 kg/m  , BMI Body mass index is 34.59 kg/m. GEN: Well nourished, well developed, in no acute distress, slurred speech HEENT: normal  Neck: no JVD, carotid bruits, or masses Cardiac: RRR; no murmurs, rubs, or gallops,no edema  Respiratory:  clear to auscultation bilaterally, normal work of breathing GI: soft, nontender, nondistended, + BS MS: no deformity or atrophy  Skin: warm and dry Neuro:  Strength and sensation are intact Psych: euthymic mood, full affect  EKG:  EKG is ordered today. Personal review of the ekg ordered shows sinus rhythm, first-degree AV block, right bundle branch block, left anterior fascicular block, rate 62  Recent Labs: 12/10/2017: BUN 11; Creatinine, Ser 1.12; Potassium 4.9; Sodium 140    Lipid Panel     Component Value Date/Time   CHOL 114 05/08/2012 0420   TRIG 54 05/08/2012 0420   HDL 34 (L) 05/08/2012 0420   CHOLHDL 3.4 05/08/2012 0420   VLDL 11 05/08/2012 0420   LDLCALC 69 05/08/2012 0420     Wt Readings  from Last 3 Encounters:  05/26/18 248 lb (112.5 kg)  05/13/18 243 lb 9.6 oz (110.5 kg)  03/13/18 241 lb 9.6 oz (109.6 kg)      Other studies Reviewed: Additional studies/ records that were reviewed today include: TTE 04/21/18  Review of the above records today demonstrates:  - Left ventricle: The cavity size was normal. Wall thickness was   increased in a pattern of mild LVH. Systolic function was normal.   The estimated ejection fraction was in the range of 55% to 60%.   Wall motion was normal; there were no regional wall motion   abnormalities. - Left atrium: The atrium was moderately dilated.  Telemetry 03/24/18 - personally reviewed Paroxysmal atrial fibrillation with fast ventricular rate. Baseline intraventricular conduction delay. Symptoms of chest pain did not correlate with any arrhythmia. Periods of first-degree AV block noted  ASSESSMENT AND PLAN:  1.  Paroxysmal atrial fibrillation: Currently on amiodarone and Coumadin.  In sinus rhythm today.  This patients CHA2DS2-VASc Score and unadjusted Ischemic Stroke Rate (% per year) is equal to 0.6 % stroke rate/year from a score of 1  Above score calculated as 1 point each if present [CHF, HTN, DM, Vascular=MI/PAD/Aortic Plaque, Age if 65-74, or Male] Above score calculated as 2 points each if present [Age > 75, or Stroke/TIA/TE]  2.  Syncope: At this point, it is unclear to me as to why he has episode of syncope.  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  I have told him that Linq monitoring would likely be beneficial, though he does have a court appointment and he says that he may be incarcerated for some time.  He could also potentially benefit from a 30-day monitor prior to that.  I have asked him to call us after his trial, for further monitoring.   3.  Ruptured thoracic aortic aneurysm: s/p AVR, currently on Coumadin  4.  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   Potential loop monitor implant.  5.  Hypertension: Patient says his blood pressures are spiking up towards 200 at home.  I Tyse Auriemma refer this to his primary cardiologist for further therapy.  Current medicines are reviewed at length with the patient today.   The patient does not have concerns regarding his medicines.  The following changes were made today:  none  Labs/ tests ordered today include:  Orders Placed This Encounter  Procedures  . EKG 12-Lead   Case discussed with primary cardiology  Disposition:   FU with Marianna Cid PRN pending possible LINQ  Signed, Orman Matsumura Jorja Loa, MD  05/26/2018 3:41 PM     [facility] [address] [place] [address] [phone] (office) [phone] (fax)

## 2018-06-10 ENCOUNTER — Ambulatory Visit (INDEPENDENT_AMBULATORY_CARE_PROVIDER_SITE_OTHER): Payer: Medicaid Other | Admitting: *Deleted

## 2018-06-10 DIAGNOSIS — I359 Nonrheumatic aortic valve disorder, unspecified: Secondary | ICD-10-CM | POA: Diagnosis not present

## 2018-06-10 DIAGNOSIS — I6330 Cerebral infarction due to thrombosis of unspecified cerebral artery: Secondary | ICD-10-CM | POA: Diagnosis not present

## 2018-06-10 DIAGNOSIS — Z952 Presence of prosthetic heart valve: Secondary | ICD-10-CM

## 2018-06-10 DIAGNOSIS — I480 Paroxysmal atrial fibrillation: Secondary | ICD-10-CM

## 2018-06-10 LAB — POCT INR: INR: 3.3 — AB (ref 2.0–3.0)

## 2018-06-10 NOTE — Patient Instructions (Addendum)
  Description    Continue 10mg  everyday. Recheck INR in 2 weeks. Call us with any new  medications or concerns # [phone].

## 2018-06-19 ENCOUNTER — Telehealth: Payer: Self-pay | Admitting: *Deleted

## 2018-06-19 DIAGNOSIS — I480 Paroxysmal atrial fibrillation: Secondary | ICD-10-CM

## 2018-06-19 NOTE — Telephone Encounter (Signed)
Informed pt that Dr. Elberta Fortisamnitz recommends another 30 day monitor before determining if ILR implant is needed.  Pt aware [place] office will contact him to arrange the monitor.

## 2018-06-24 ENCOUNTER — Ambulatory Visit (INDEPENDENT_AMBULATORY_CARE_PROVIDER_SITE_OTHER): Payer: Medicaid Other | Admitting: *Deleted

## 2018-06-24 DIAGNOSIS — I480 Paroxysmal atrial fibrillation: Secondary | ICD-10-CM

## 2018-06-24 DIAGNOSIS — I6330 Cerebral infarction due to thrombosis of unspecified cerebral artery: Secondary | ICD-10-CM | POA: Diagnosis not present

## 2018-06-24 DIAGNOSIS — Z952 Presence of prosthetic heart valve: Secondary | ICD-10-CM | POA: Diagnosis not present

## 2018-06-24 DIAGNOSIS — I359 Nonrheumatic aortic valve disorder, unspecified: Secondary | ICD-10-CM

## 2018-06-24 LAB — POCT INR: INR: 4.4 — AB (ref 2.0–3.0)

## 2018-06-24 NOTE — Patient Instructions (Signed)
Description   Skip today's dose, then ontinue 10mg  everyday. Recheck INR in 2 weeks. Call us with any new  medications or concerns # [phone].

## 2018-07-08 ENCOUNTER — Other Ambulatory Visit: Payer: Self-pay | Admitting: Cardiology

## 2018-07-08 ENCOUNTER — Ambulatory Visit (INDEPENDENT_AMBULATORY_CARE_PROVIDER_SITE_OTHER): Payer: Medicaid Other | Admitting: Pharmacist

## 2018-07-08 DIAGNOSIS — I359 Nonrheumatic aortic valve disorder, unspecified: Secondary | ICD-10-CM

## 2018-07-08 DIAGNOSIS — Z952 Presence of prosthetic heart valve: Secondary | ICD-10-CM | POA: Diagnosis not present

## 2018-07-08 DIAGNOSIS — I6330 Cerebral infarction due to thrombosis of unspecified cerebral artery: Secondary | ICD-10-CM

## 2018-07-08 DIAGNOSIS — I480 Paroxysmal atrial fibrillation: Secondary | ICD-10-CM | POA: Diagnosis not present

## 2018-07-08 LAB — POCT INR: INR: 3 (ref 2.0–3.0)

## 2018-07-08 NOTE — Patient Instructions (Signed)
Description   Continue 10mg  everyday. Recheck INR in 3 weeks. Call us with any new  medications or concerns # [phone].

## 2018-08-18 ENCOUNTER — Telehealth: Payer: Self-pay

## 2018-08-18 NOTE — Telephone Encounter (Signed)
lmom for prescreen and drive thru 

## 2018-08-19 ENCOUNTER — Other Ambulatory Visit: Payer: Self-pay

## 2018-08-19 ENCOUNTER — Ambulatory Visit (INDEPENDENT_AMBULATORY_CARE_PROVIDER_SITE_OTHER): Payer: Medicaid Other | Admitting: *Deleted

## 2018-08-19 DIAGNOSIS — Z952 Presence of prosthetic heart valve: Secondary | ICD-10-CM | POA: Diagnosis not present

## 2018-08-19 DIAGNOSIS — I480 Paroxysmal atrial fibrillation: Secondary | ICD-10-CM

## 2018-08-19 DIAGNOSIS — I359 Nonrheumatic aortic valve disorder, unspecified: Secondary | ICD-10-CM | POA: Diagnosis not present

## 2018-08-19 DIAGNOSIS — I6330 Cerebral infarction due to thrombosis of unspecified cerebral artery: Secondary | ICD-10-CM

## 2018-08-19 LAB — POCT INR: INR: 5.1 — AB (ref 2.0–3.0)

## 2018-09-01 ENCOUNTER — Telehealth: Payer: Self-pay

## 2018-09-01 NOTE — Telephone Encounter (Signed)
lmom 

## 2018-09-02 ENCOUNTER — Other Ambulatory Visit: Payer: Self-pay

## 2018-09-02 ENCOUNTER — Ambulatory Visit (INDEPENDENT_AMBULATORY_CARE_PROVIDER_SITE_OTHER): Payer: Medicaid Other | Admitting: *Deleted

## 2018-09-02 DIAGNOSIS — I359 Nonrheumatic aortic valve disorder, unspecified: Secondary | ICD-10-CM

## 2018-09-02 DIAGNOSIS — I480 Paroxysmal atrial fibrillation: Secondary | ICD-10-CM | POA: Diagnosis not present

## 2018-09-02 DIAGNOSIS — Z5181 Encounter for therapeutic drug level monitoring: Secondary | ICD-10-CM | POA: Diagnosis not present

## 2018-09-02 DIAGNOSIS — Z952 Presence of prosthetic heart valve: Secondary | ICD-10-CM | POA: Diagnosis not present

## 2018-09-02 DIAGNOSIS — I6330 Cerebral infarction due to thrombosis of unspecified cerebral artery: Secondary | ICD-10-CM

## 2018-09-02 LAB — POCT INR: INR: 4.6 — AB (ref 2.0–3.0)

## 2018-09-02 NOTE — Patient Instructions (Signed)
Description   Spoke with mom-Mrs. McClintock-on DPR and instructed to have pt to skip tonight's dose, take 5mg  tomorrow then start taking 10mg  everyday except 5mg  on Sundays. Recheck INR in 2 weeks. Call us with any new  medications or concerns # [phone].

## 2018-09-15 ENCOUNTER — Telehealth: Payer: Self-pay

## 2018-09-15 NOTE — Telephone Encounter (Signed)

## 2018-09-16 ENCOUNTER — Other Ambulatory Visit: Payer: Self-pay

## 2018-09-16 ENCOUNTER — Ambulatory Visit (INDEPENDENT_AMBULATORY_CARE_PROVIDER_SITE_OTHER): Payer: Medicaid Other

## 2018-09-16 DIAGNOSIS — Z952 Presence of prosthetic heart valve: Secondary | ICD-10-CM

## 2018-09-16 DIAGNOSIS — I359 Nonrheumatic aortic valve disorder, unspecified: Secondary | ICD-10-CM

## 2018-09-16 DIAGNOSIS — I480 Paroxysmal atrial fibrillation: Secondary | ICD-10-CM

## 2018-09-16 DIAGNOSIS — I6330 Cerebral infarction due to thrombosis of unspecified cerebral artery: Secondary | ICD-10-CM

## 2018-09-16 LAB — POCT INR: INR: 1.5 — AB (ref 2.0–3.0)

## 2018-09-16 NOTE — Patient Instructions (Signed)
Description   Spoke with mom-Mrs. McClintock-on DPR and instructed to have pt take 15mg  today and tomorrow, then resume same dosage 10mg  everyday except 5mg  on Sundays. Recheck INR in 2 weeks. Call us with any new  medications or concerns # [phone].

## 2018-09-26 ENCOUNTER — Telehealth: Payer: Self-pay

## 2018-09-26 NOTE — Telephone Encounter (Signed)

## 2018-09-30 ENCOUNTER — Other Ambulatory Visit: Payer: Self-pay

## 2018-09-30 ENCOUNTER — Ambulatory Visit (INDEPENDENT_AMBULATORY_CARE_PROVIDER_SITE_OTHER): Payer: Medicaid Other | Admitting: *Deleted

## 2018-09-30 DIAGNOSIS — I6330 Cerebral infarction due to thrombosis of unspecified cerebral artery: Secondary | ICD-10-CM

## 2018-09-30 DIAGNOSIS — I480 Paroxysmal atrial fibrillation: Secondary | ICD-10-CM

## 2018-09-30 DIAGNOSIS — Z952 Presence of prosthetic heart valve: Secondary | ICD-10-CM

## 2018-09-30 DIAGNOSIS — Z5181 Encounter for therapeutic drug level monitoring: Secondary | ICD-10-CM

## 2018-09-30 DIAGNOSIS — I359 Nonrheumatic aortic valve disorder, unspecified: Secondary | ICD-10-CM

## 2018-09-30 LAB — POCT INR: INR: 4.6 — AB (ref 2.0–3.0)

## 2018-09-30 NOTE — Patient Instructions (Signed)
Description   Skip today's dose, then change your dosage to 10mg  everyday except 5mg  on Sundays and Thursdays. Recheck INR in 2 weeks. Call us with any new  medications or concerns # [phone].

## 2018-10-06 HISTORY — PX: PERMANENT PACEMAKER INSERTION: SHX6023

## 2018-10-07 ENCOUNTER — Other Ambulatory Visit: Payer: Self-pay | Admitting: Cardiology

## 2018-10-07 ENCOUNTER — Telehealth: Payer: Self-pay | Admitting: Cardiology

## 2018-10-07 NOTE — Telephone Encounter (Signed)
His pulse is 110 and he's been in the bed for 2 hours

## 2018-10-07 NOTE — Telephone Encounter (Signed)
Left message for patient to return call.

## 2018-10-08 MED ORDER — AMIODARONE HCL 200 MG PO TABS: 200.0000 mg | ORAL_TABLET | Freq: Two times a day (BID) | ORAL | 1 refills | Status: DC

## 2018-10-08 NOTE — Telephone Encounter (Signed)
Patient called back and reports that he is in atrial fibrillation. He states he went to [facility] yesterday and they advised him to see Dr. Bing Matter within 2 days. He reports shortness of breath and states he heart rate fluctuates between 126-170. His heart rate while on the phone with me was 111 and blood pressure 127/93. He states he hasn't been feeling good for several days. Will consult with Dr. Bing Matter.

## 2018-10-08 NOTE — Addendum Note (Signed)
Addended by: Lita Mains on: 10/08/2018 12:04 PM   Modules accepted: Orders

## 2018-10-08 NOTE — Telephone Encounter (Signed)
Consulted with Dr. Janyce Llanos. Patient called back and was informed to increase amiodarone to 200 mg twice daily per Dr. Janeece Agee. Patient verbally understands. I advised him to call back if things don't get better.

## 2018-10-09 ENCOUNTER — Telehealth: Payer: Self-pay

## 2018-10-09 NOTE — Telephone Encounter (Signed)

## 2018-10-14 ENCOUNTER — Other Ambulatory Visit: Payer: Self-pay

## 2018-10-14 ENCOUNTER — Ambulatory Visit (INDEPENDENT_AMBULATORY_CARE_PROVIDER_SITE_OTHER): Payer: Medicaid Other | Admitting: *Deleted

## 2018-10-14 DIAGNOSIS — Z5181 Encounter for therapeutic drug level monitoring: Secondary | ICD-10-CM

## 2018-10-14 DIAGNOSIS — Z952 Presence of prosthetic heart valve: Secondary | ICD-10-CM | POA: Diagnosis not present

## 2018-10-14 DIAGNOSIS — I480 Paroxysmal atrial fibrillation: Secondary | ICD-10-CM | POA: Diagnosis not present

## 2018-10-14 DIAGNOSIS — I359 Nonrheumatic aortic valve disorder, unspecified: Secondary | ICD-10-CM

## 2018-10-14 DIAGNOSIS — I6330 Cerebral infarction due to thrombosis of unspecified cerebral artery: Secondary | ICD-10-CM | POA: Diagnosis not present

## 2018-10-14 LAB — POCT INR: INR: 3 (ref 2.0–3.0)

## 2018-10-14 NOTE — Patient Instructions (Signed)
Description   Start taking 10mg  everyday except 5mg  on Sundays, Tuesdays and Thursdays. Recheck INR in 2 weeks. Call us with any new  medications or concerns # [phone].

## 2018-10-21 ENCOUNTER — Telehealth: Payer: Self-pay

## 2018-10-21 NOTE — Telephone Encounter (Signed)

## 2018-12-02 ENCOUNTER — Ambulatory Visit: Payer: Self-pay | Admitting: Cardiology

## 2018-12-02 DIAGNOSIS — I6330 Cerebral infarction due to thrombosis of unspecified cerebral artery: Secondary | ICD-10-CM

## 2018-12-02 DIAGNOSIS — I480 Paroxysmal atrial fibrillation: Secondary | ICD-10-CM

## 2018-12-02 DIAGNOSIS — I359 Nonrheumatic aortic valve disorder, unspecified: Secondary | ICD-10-CM

## 2018-12-02 DIAGNOSIS — Z952 Presence of prosthetic heart valve: Secondary | ICD-10-CM

## 2018-12-20 ENCOUNTER — Other Ambulatory Visit: Payer: Self-pay | Admitting: Cardiology

## 2018-12-24 NOTE — Telephone Encounter (Signed)
Per anticoag note on 12/02/18 in Epic pt is now having his Coumadin managed and monitored by [facility] cardiology.  Will advised pharmacy to send refill request to their office.

## 2018-12-24 NOTE — Telephone Encounter (Signed)
pls refill, tx 

## 2018-12-29 ENCOUNTER — Other Ambulatory Visit: Payer: Self-pay | Admitting: Cardiology

## 2019-01-13 LAB — PROTIME-INR: INR: 2.5 — AB (ref 0.9–1.1)

## 2019-01-26 ENCOUNTER — Other Ambulatory Visit: Payer: Self-pay | Admitting: Cardiology

## 2019-02-11 LAB — PROTIME-INR: INR: 2.2 — AB (ref 0.9–1.1)

## 2019-02-20 ENCOUNTER — Telehealth: Payer: Self-pay | Admitting: Cardiology

## 2019-02-20 NOTE — Telephone Encounter (Signed)
Patient called back reports heart rate has been increased today between 100-124. He denies chest pain, or palpitations. He denies shortness of breath worse then his normal. Dr. Fraser Din. Aware. Patient has appointment with Dr. Agustin Cree on Monday. Patient advised to follow up then with him or go to the emergency department if things get worse over the weekend. He verbally understood, no further questions.

## 2019-02-20 NOTE — Telephone Encounter (Signed)
Left message for patient to return call.

## 2019-02-20 NOTE — Telephone Encounter (Signed)
Pt. Says his heart rate has gone up and he requests a call back

## 2019-02-23 ENCOUNTER — Other Ambulatory Visit: Payer: Self-pay

## 2019-02-23 ENCOUNTER — Ambulatory Visit (INDEPENDENT_AMBULATORY_CARE_PROVIDER_SITE_OTHER): Payer: Medicaid Other | Admitting: Cardiology

## 2019-02-23 ENCOUNTER — Encounter: Payer: Self-pay | Admitting: Cardiology

## 2019-02-23 VITALS — BP 110/70 | HR 110 | Ht 71.0 in | Wt 249.8 lb

## 2019-02-23 DIAGNOSIS — I711 Thoracic aortic aneurysm, ruptured, unspecified: Secondary | ICD-10-CM

## 2019-02-23 DIAGNOSIS — I6990 Unspecified sequelae of unspecified cerebrovascular disease: Secondary | ICD-10-CM | POA: Diagnosis not present

## 2019-02-23 DIAGNOSIS — Z952 Presence of prosthetic heart valve: Secondary | ICD-10-CM | POA: Diagnosis not present

## 2019-02-23 DIAGNOSIS — I480 Paroxysmal atrial fibrillation: Secondary | ICD-10-CM

## 2019-02-23 DIAGNOSIS — Z950 Presence of cardiac pacemaker: Secondary | ICD-10-CM

## 2019-02-23 NOTE — Progress Notes (Addendum)
Cardiology Office Note:    Date:  02/23/2019   ID:  Jon [name] Hathaway, DOB 24-Apr-1966, MRN [id number]  PCP:  Guadalupe MapleGage, John F., MD  Cardiologist:  Gypsy Balsamobert , MD    Referring MD: Guadalupe MapleGage, John F., MD   Chief Complaint  Patient presents with  . Follow-up  . Hospitalization Follow-up    History of Present Illness:    Jon [name] is a 53 y.o. male complex past medical history which include rupture ascending aortic aneurysm required repair with aortic valve replacement that happened in 2014.  Sadly after surgery he had a stroke left him with some expressive aphasia recently he ended up having complete heart block treated with pacemaker and that happened in December of this year.  He requested to be seen because of palpitations he feels his heart speeding up and then slowing down again.  Denies have any chest pain, tightness, pressure, burning in the chest.   Complain of being weak and tired right now.  No swelling of lower extremities.  Past Medical History:  Diagnosis Date  . Aneurysm of thoracic aorta (HCC) 05/06/2012   IMO routine update IMO routine update  . Anxiety    sees Dr. Renae FickleJohn Gage  . Aortic aneurysm and dissection (HCC) 05/06/2012  . Aortic stenosis    mechanical AVR 04/22/12  . Aortic valve disorder 05/06/2012  . 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 Bicuspid aortic valve   . Cerebral infarction (HCC) 05/11/2012  . Cerebral thrombosis with cerebral infarction (HCC) 07/14/2013  . Cholelithiasis 12/20/2016  . Current use of long term anticoagulation 12/20/2016  . CVA (cerebral infarction)    occipital CVA 12/13  . Dissecting aortic aneurysm (HCC) 05/06/2012  . Dysarthria 09/10/2017  . Dysrhythmia    se[facility] Dr[facility] Geronimo [facility]myer, [facility] in [place]  . Generalized constriction of visual field 10/15/2012   IMOUPDATE IMOUPDATE  . 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 GERD (gastroesophageal reflux disease)   . HCAP  (healthcare-associated pneumonia) 05/06/2012  . Headache(784.0)    "due to vision problem"  . History of CVA (cerebrovascular accident) 05/07/2012  . Hypothyroidism   . Idiopathic peripheral neuropathy 07/14/2013  . Late effects of CVA (cerebrovascular accident) 08/08/2015  . Nontraumatic multiple localized intracerebral hemorrhages (HCC) 10/16/2017  . Paroxysmal atrial fibrillation (HCC) 08/08/2015   On warfarin On warfarin  . Presence of prosthetic heart valve 08/08/2015  . Shortness of breath   . Stroke (HCC)    x3  . Stroke, embolic (HCC) 05/07/2012  . Syncope 05/06/2012  . Thoracic aortic aneurysm (HCC)    replacement aortic root 04/22/12  . VBI (vertebrobasilar insufficiency) 09/10/2017    Past Surgical History:  Procedure Laterality Date  . APPLICATION OF WOUND VAC  06/11/2012   Procedure: APPLICATION OF WOUND VAC;  Surgeon: Loreli SlotSteven C Hendrickson, MD;  [facility];  Service: Vascular;  Laterality: Right;  . CARDIAC CATHETERIZATION    . CHOLECYSTECTOMY    . CORONARY ARTERY BYPASS GRAFT  04/22/2012   Procedure: CORONARY ARTERY BYPASS GRAFTING (CABG);  Surgeon: Loreli SlotSteven C Hendrickson, MD;  [facility];  Service: Open Heart Surgery;  Laterality: N/A;  times one with right greater saphenous vein graft  . I&D EXTREMITY  06/11/2012   Procedure: IRRIGATION AND DEBRIDEMENT EXTREMITY;  Surgeon: Loreli SlotSteven C Hendrickson, MD;  [facility];  Service: Vascular;  Laterality: Right;  . INTRAOPERATIVE TRANSESOPHAGEAL ECHOCARDIOGRAM  04/22/2012   Procedure: INTRAOPERATIVE TRANSESOPHAGEAL ECHOCARDIOGRAM;  Surgeon: Loreli SlotSteven C Hendrickson, MD;  Location: [facility] OR;  Service: Open Heart Surgery;  Laterality: N/A;  . ROTATOR CUFF REPAIR     12 years ago, Left  . THORACIC AORTIC ANEURYSM REPAIR  04/22/2012   Procedure: THORACIC ASCENDING ANEURYSM REPAIR (AAA);  Surgeon: Melrose Nakayama, MD;  Location: [place];  Service: Open Heart Surgery;  Laterality: N/A;    Current Medications: No outpatient medications  have been marked as taking for the 02/23/19 encounter (Office Visit) with Park Liter, MD.     Allergies:   Hydrocodone-acetaminophen, Hydromorphone, Other, Oxycodone, Penicillins, and Dilaudid [hydromorphone hcl]   Social History   Socioeconomic History  . Marital status: Married    Spouse name: Not on file  . Number of children: 2  . Years of education: Not on file  . Highest education level: Not on file  Occupational History  . Occupation: disabled  . Occupation: Dealer  Social Needs  . Financial resource strain: Not on file  . Food insecurity    Worry: Not on file    Inability: Not on file  . Transportation needs    Medical: Not on file    Non-medical: Not on file  Tobacco Use  . Smoking status: Current Every Day Smoker    Packs/day: 2.50    Types: Cigarettes    Start date: 04/21/2012    Last attempt to quit: 04/21/2012    Years since quitting: 6.8  . Smokeless tobacco: Never Used  Substance and Sexual Activity  . Alcohol use: Yes  . Drug use: No    Comment: previous crack cocaine "quit 4 years ago"  . Sexual activity: Yes  Lifestyle  . Physical activity    Days per week: Not on file    Minutes per session: Not on file  . Stress: Not on file  Relationships  . Social Herbalist on phone: Not on file    Gets together: Not on file    Attends religious service: Not on file    Active member of club or organization: Not on file    Attends meetings of clubs or organizations: Not on file    Relationship status: Not on file  Other Topics Concern  . Not on file  Social History Narrative  . Not on file     Family History: The patient's family history includes CAD in his father; Cancer in his maternal grandfather; Diabetes in his father; Heart disease in his father and paternal grandfather; Hypertension in his father; Lung disease in his mother; Peripheral Artery Disease in his father; Stroke in his father. ROS:   Please see the history of  present illness.    All 14 point review of systems negative except as described per history of present illness  EKGs/Labs/Other Studies Reviewed:    25 February 2019 1:16 PM.  Patient came for his pacemaker interrogation.  Functioning properly.  It looks like episodes of atrial fibrillation started sometimes about a week ago or so.  He does have under sensing of the P wave it fell into the blanking.Marland Kitchen  However he seems to be in atrial fibrillation since then.  Therefore I decided to increase dose of amiodarone he will be on amiodarone to 1 mg 3 times daily and I bring him back next week to see if he still in atrial fibrillation.  Obviously we will continue anticoagulation  Recent Labs: No results found for requested labs within last 8760 hours.  Recent Lipid Panel    Component Value Date/Time  CHOL 114 05/08/2012 0420   TRIG 54 05/08/2012 0420   HDL 34 (L) 05/08/2012 0420   CHOLHDL 3.4 05/08/2012 0420   VLDL 11 05/08/2012 0420   LDLCALC 69 05/08/2012 0420    Physical Exam:    VS:  BP 110/70   Pulse (!) 110   Ht 5\' 11"  (1.803 m)   Wt 249 lb 12.8 oz (113.3 kg)   SpO2 97%   BMI 34.84 kg/m     Wt Readings from Last 3 Encounters:  02/23/19 249 lb 12.8 oz (113.3 kg)  05/26/18 248 lb (112.5 kg)  05/13/18 243 lb 9.6 oz (110.5 kg)     GEN:  Well nourished, well developed in no acute distress HEENT: Normal NECK: No JVD; No carotid bruits LYMPHATICS: No lymphadenopathy CARDIAC: Irregular, mildly tachycardic, no murmurs, no rubs, no gallops RESPIRATORY:  Clear to auscultation without rales, wheezing or rhonchi  ABDOMEN: Soft, non-tender, non-distended MUSCULOSKELETAL:  No edema; No deformity  SKIN: Warm and dry LOWER EXTREMITIES: no swelling NEUROLOGIC:  Alert and oriented x 3 PSYCHIATRIC:  Normal affect   ASSESSMENT:    1. Paroxysmal atrial fibrillation (HCC)   2. Ruptured aneurysm of thoracic aorta (HCC)   3. Late effects of CVA (cerebrovascular accident)   4. Status post  aortic valve replacement   5. Pacemaker [place] [employer] device implanted in summer 2020    PLAN:    In order of problems listed above:  1. Paroxysmal atrial fibrillation.  Anticoagulated.  I will ask the pacemaker rep to come and interrogate his device to see how much of atrial fibrillation he has I suspect that the reason for him feeling not well could be frequent episode of atrial fibrillation.  EKG done today is difficult to interpret show right bundle branch block with left anterior fascicular block which is exactly the same morphology that he did have before I think I see pacemaker spikes but for better understanding his rhythm pacemaker interrogations warranted.  He is anticoagulated which I will continue if he does have frequent episode of atrial fibrillation amiodarone will be increased. 2. History of rupture ultrasound was with her with her.  Apparently CT done at [facility] showed stable aneurysm.  Status post repair 3. Status post aortic valve replacement doing well from that point review 4. History of CVA.  Still some difficulty speaking but doing overall fair   Medication Adjustments/Labs and Tests Ordered: Current medicines are reviewed at length with the patient today.  Concerns regarding medicines are outlined above.  No orders of the defined types were placed in this encounter.  Medication changes: No orders of the defined types were placed in this encounter.   Signed, [facility], MD, [facility] 02/23/2019 11:06 AM    [facility]

## 2019-02-23 NOTE — Addendum Note (Signed)
Addended by: Ashok Norris on: 02/23/2019 11:14 AM   Modules accepted: Orders

## 2019-02-23 NOTE — Patient Instructions (Signed)
Medication Instructions:  Your physician recommends that you continue on your current medications as directed. Please refer to the Current Medication list given to you today.  *If you need a refill on your cardiac medications before your next appointment, please call your pharmacy*  Lab Work: None If you have labs (blood work) drawn today and your tests are completely normal, you will receive your results only by: . MyChart Message (if you have MyChart) OR . A paper copy in the mail If you have any lab test that is abnormal or we need to change your treatment, we will call you to review the results.  Testing/Procedures: None  Follow-Up: At [facility], you and your health needs are our priority.  As part of our continuing mission to provide you with exceptional heart care, we have created designated Provider Care Teams.  These Care Teams include your primary Cardiologist (physician) and Advanced Practice Providers (APPs -  Physician Assistants and Nurse Practitioners) who all work together to provide you with the care you need, when you need it.  Your next appointment:   1 month  The format for your next appointment:   In Person  Provider:   Robert Krasowski, MD  Other Instructions   

## 2019-02-24 ENCOUNTER — Telehealth: Payer: Self-pay | Admitting: Emergency Medicine

## 2019-02-24 NOTE — Telephone Encounter (Signed)
Called patient informed him of his appt to have his pacemaker interrogation on 02/25/2019 in [place] office at 2 pm. Patient verbally understood. No further questions.

## 2019-02-25 ENCOUNTER — Telehealth: Payer: Self-pay | Admitting: Emergency Medicine

## 2019-02-25 MED ORDER — AMIODARONE HCL 200 MG PO TABS: 200.0000 mg | ORAL_TABLET | Freq: Three times a day (TID) | ORAL | 1 refills | Status: DC

## 2019-02-25 NOTE — Telephone Encounter (Signed)
Patient was in office for pacemaker interrogation. Patient advised to increase amiodarone to 200 mg three times daily per Dr. Agustin Cree and come back next week for a ekg. Patient informed of appt. No further questions.

## 2019-02-26 LAB — PROTIME-INR

## 2019-02-28 ENCOUNTER — Other Ambulatory Visit: Payer: Self-pay | Admitting: Cardiology

## 2019-03-02 ENCOUNTER — Telehealth: Payer: Self-pay | Admitting: Cardiology

## 2019-03-02 NOTE — Telephone Encounter (Signed)
Called patient spoke with his mother. She reports he is still having his heart rate go from 60 up to 150. She reported it "jumps around", during these times it causes the patient chest pain and shortness of breath. I advised patient to go to the emergency department if having active chest pain. Patient's mother verbally understood and will inform patient. Otherwise I will consult with Dr. Agustin Cree and see if there are any other recommendations at this time.

## 2019-03-02 NOTE — Telephone Encounter (Signed)
He still ain't doing good

## 2019-03-02 NOTE — Telephone Encounter (Signed)
We have just increased amiodarone, takes some time to work, please wait

## 2019-03-02 NOTE — Telephone Encounter (Signed)
Called and spoke to patient's mother. Informed her of Dr. Wendy Poet recommendations. She verbally understood. No further questions.

## 2019-03-04 ENCOUNTER — Encounter: Payer: Self-pay | Admitting: Cardiology

## 2019-03-04 ENCOUNTER — Ambulatory Visit (INDEPENDENT_AMBULATORY_CARE_PROVIDER_SITE_OTHER): Payer: Medicaid Other | Admitting: Cardiology

## 2019-03-04 ENCOUNTER — Other Ambulatory Visit: Payer: Self-pay

## 2019-03-04 ENCOUNTER — Other Ambulatory Visit: Payer: Self-pay | Admitting: Cardiology

## 2019-03-04 ENCOUNTER — Telehealth: Payer: Self-pay | Admitting: *Deleted

## 2019-03-04 VITALS — BP 104/90 | HR 101 | Ht 71.0 in | Wt 251.0 lb

## 2019-03-04 DIAGNOSIS — Z952 Presence of prosthetic heart valve: Secondary | ICD-10-CM

## 2019-03-04 DIAGNOSIS — Z01812 Encounter for preprocedural laboratory examination: Secondary | ICD-10-CM | POA: Diagnosis not present

## 2019-03-04 DIAGNOSIS — Z950 Presence of cardiac pacemaker: Secondary | ICD-10-CM

## 2019-03-04 DIAGNOSIS — I480 Paroxysmal atrial fibrillation: Secondary | ICD-10-CM | POA: Diagnosis not present

## 2019-03-04 NOTE — Telephone Encounter (Signed)
Telephone call to both home and mobile numbers. Left message that cardioversion is scheduled for 9 am at [place] [facility] at 7 am for preprocedure workup.

## 2019-03-04 NOTE — Telephone Encounter (Signed)
Patient called back and confirmed apppointment at [facility] for cardioversion at 9 AM and arrive at 7 am.

## 2019-03-04 NOTE — Patient Instructions (Addendum)
Medication Instructions:  Your physician recommends that you continue on your current medications as directed. Please refer to the Current Medication list given to you today.  *If you need a refill on your cardiac medications before your next appointment, please call your pharmacy*  Lab Work: Your physician recommends that you return for lab work in: TODAY BMP,CBC.INR  If you have labs (blood work) drawn today and your tests are completely normal, you will receive your results only by: Marland Kitchen MyChart Message (if you have MyChart) OR . A paper copy in the mail If you have any lab test that is abnormal or we need to change your treatment, we will call you to review the results.  Testing/Procedures: Your physician has recommended that you have a Cardioversion (DCCV). Electrical Cardioversion uses a jolt of electricity to your heart either through paddles or wired patches attached to your chest. This is a controlled, usually prescheduled, procedure. Defibrillation is done under light anesthesia in the hospital, and you usually go home the day of the procedure. This is done to get your heart back into a normal rhythm. You are not awake for the procedure. Please see the instruction sheet given to you today.   You will be scheduledfor a /Cardioversion    DIET: Nothing to eat or drink after midnight except a sip of water with medications (see medication instructions below)  Medication Instructions:   Continue your anticoagulant: warfarin You will need to continue your anticoagulant after your procedure until you  are told by your  Provider that it is safe to stop   Labs: Labs drawn today  You must have a responsible person to drive you home and stay in the waiting area during your procedure. Failure to do so could result in cancellation.  Bring your insurance cards.  *Special Note: Every effort is made to have your procedure done on time. Occasionally there are emergencies that occur at the  hospital that may cause delays. Please be patient if a delay does occur.   Follow-Up: At [facility], you and your health needs are our priority.  As part of our continuing mission to provide you with exceptional heart care, we have created designated Provider Care Teams.  These Care Teams include your primary Cardiologist (physician) and Advanced Practice Providers (APPs -  Physician Assistants and Nurse Practitioners) who all work together to provide you with the care you need, when you need it.  Your next appointment:   65month  The format for your next appointment:   In Person  Provider:   Jenne Campus, MD  Other Instructions

## 2019-03-04 NOTE — Progress Notes (Signed)
Cardiology Office Note:    Date:  03/04/2019   ID:  Jon [name], DOB Jul 09, 1965, MRN [id number]  PCP:  Jon Doyne., MD  Cardiologist:  Jon Campus, MD    Referring MD: Jon Doyne., MD   Chief Complaint  Patient presents with  . Follow-up    EKG per Dr. Raliegh Ip    History of Present Illness:    Jon [name] is a 53 y.o. male with complex past medical history.  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  Also wanting valve replacement.  Sadly surgery has been complicated by stroke.  Recently he ended up going to [facility] because of complete heart block and required permanent pacing essentially pacemaker has been inserted.  He presented to my office last week complaining of feeling weak tired exhausted we noticed that he was in atrial fibrillation.  Interrogation of the device revealed appropriate function of the device however he was noted to have atrial fibrillation for about a week before presented to our office.  He complained of being tired exhausted having shortness of breath.  No swelling of lower extremities no proximal nocturnal dyspnea.  He comes today to the office to have EKG repeated which showed persistent presence of atrial fibrillation.  Past Medical History:  Diagnosis Date  . Aneurysm of thoracic aorta ([place]) 05/06/2012   IMO routine update IMO routine update  . Anxiety    sees Dr. Wende Neighbors  . Aortic aneurysm and dissection ([place]) 05/06/2012  . Aortic stenosis    mechanical AVR 04/22/12  . Aortic valve disorder 05/06/2012  . 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 Bicuspid aortic valve   . Cerebral infarction ([place]) 05/11/2012  . Cerebral thrombosis with cerebral infarction ([place]) 07/14/2013  . Cholelithiasis 12/20/2016  . Current use of long term anticoagulation 12/20/2016  . CVA (cerebral infarction)    occipital CVA 12/13  . Dissecting aortic  aneurysm ([place]) 05/06/2012  . Dysarthria 09/10/2017  . Dysrhythmia    sees Dr. Leslie Dales, [facility] cardiology in [place]  . Generalized constriction of visual field 10/15/2012   IMOUPDATE IMOUPDATE  . 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 GERD (gastroesophageal reflux disease)   . HCAP (healthcare-associated pneumonia) 05/06/2012  . Headache(784.0)    "due to vision problem"  . History of CVA (cerebrovascular accident) 05/07/2012  . Hypothyroidism   . Idiopathic peripheral neuropathy 07/14/2013  . Late effects of CVA (cerebrovascular accident) 08/08/2015  . Nontraumatic multiple localized intracerebral hemorrhages ([place]) 10/16/2017  . Paroxysmal atrial fibrillation ([place]) 08/08/2015   On warfarin On warfarin  . Presence of prosthetic heart valve 08/08/2015  . Shortness of breath   . Stroke ([place])    x3  . Stroke, embolic ([place]) 11/09/6431  . Syncope 05/06/2012  . Thoracic aortic aneurysm ([place])    replacement aortic root 04/22/12  . VBI (vertebrobasilar insufficiency) 09/10/2017    Past Surgical History:  Procedure Laterality Date  . APPLICATION OF WOUND VAC  06/11/2012   Procedure: APPLICATION OF WOUND VAC;  Surgeon: Melrose Nakayama, MD;  Location: [place];  Service: Vascular;  Laterality: Right;  . CARDIAC CATHETERIZATION    . CHOLECYSTECTOMY    . CORONARY ARTERY BYPASS GRAFT  04/22/2012   Procedure: CORONARY ARTERY BYPASS GRAFTING (CABG);  Surgeon: Melrose Nakayama, MD;  Location: [place];  Service: Open Heart Surgery;  Laterality: N/A;  times one with right greater saphenous vein graft  .  I&D EXTREMITY  06/11/2012   Procedure: IRRIGATION AND DEBRIDEMENT EXTREMITY;  Surgeon: [name], MD;  Location: [facility] OR;  Service: Vascular;  Laterality: Right;  . INTRAOPERATIVE TRANSESOPHAGEAL ECHOCARDIOGRAM  04/22/2012   Procedure: INTRAOPERATIVE TRANSESOPHAGEAL ECHOCARDIOGRAM;  Surgeon: [name], MD;  Location: [facility] OR;  Service: Open Heart Surgery;  Laterality: N/A;   . ROTATOR CUFF REPAIR     12 years ago, Left  . THORACIC AORTIC ANEURYSM REPAIR  04/22/2012   Procedure: THORACIC ASCENDING ANEURYSM REPAIR (AAA);  Surgeon: [name], MD;  Location: [facility] OR;  Service: Open Heart Surgery;  Laterality: N/A;    Current Medications: Current Meds  Medication Sig  . acetaminophen (TYLENOL) 500 MG tablet Take by mouth.  Marland Kitchen amiodarone (PACERONE) 200 MG tablet Take 1 tablet (200 mg total) by mouth 3 (three) times daily.  Marland Kitchen aspirin 81 MG chewable tablet Chew 1 tablet (81 mg total) by mouth daily.  . busPIRone (BUSPAR) 15 MG tablet Take 15 mg by mouth 2 (two) times daily.   . cyclobenzaprine (FLEXERIL) 10 MG tablet TK 1 T PO TID  . DULoxetine (CYMBALTA) 60 MG capsule Take by mouth.  . furosemide (LASIX) 20 MG tablet Take 20 mg by mouth 2 (two) times daily. Takes 2 in am and 1 at lunch  . gabapentin (NEURONTIN) 300 MG capsule Take 300 mg by mouth 2 (two) times daily. Takes 1 in am and 2 in pm  . ibuprofen (ADVIL,MOTRIN) 600 MG tablet Take 600 mg by mouth every 8 (eight) hours.  Marland Kitchen levothyroxine (SYNTHROID, LEVOTHROID) 50 MCG tablet Take 50 mcg by mouth daily before breakfast.  . montelukast (SINGULAIR) 10 MG tablet Take 10 mg by mouth at bedtime.  Marland Kitchen omeprazole (PRILOSEC) 20 MG capsule Take by mouth.  . potassium chloride (K-DUR) 10 MEQ tablet Take by mouth daily.  Marland Kitchen PROAIR HFA 108 (90 Base) MCG/ACT inhaler INHALE 2 PUFFS BY MOUTH EVERY 4 TO 6 HOURS AS NEEDED  . rosuvastatin (CRESTOR) 40 MG tablet Take 40 mg by mouth daily.  Marland Kitchen topiramate (TOPAMAX) 50 MG tablet TAKE 1 TABLET BY MOUTH IN THE MORNING FOR 7 DAYS THEN TAKE 1 TABLET BY MOUTH TWICE DAILY  . warfarin (COUMADIN) 5 MG tablet TAKE 1 & 1/2 TO 2 TABS BY MOUTH DAILY AS DIRECTED BY COUMADIN CLINIC     Allergies:   Hydrocodone-acetaminophen, Hydromorphone, Other, Oxycodone, Penicillins, and Dilaudid [hydromorphone hcl]   Social History   Socioeconomic History  . Marital status: Married    Spouse name:  Not on file  . Number of children: 2  . Years of education: Not on file  . Highest education level: Not on file  Occupational History  . Occupation: disabled  . Occupation: Curator  Social Needs  . Financial resource strain: Not on file  . Food insecurity    Worry: Not on file    Inability: Not on file  . Transportation needs    Medical: Not on file    Non-medical: Not on file  Tobacco Use  . Smoking status: Current Every Day Smoker    Packs/day: 2.50    Types: Cigarettes    Start date: 04/21/2012    Last attempt to quit: 04/21/2012    Years since quitting: 6.8  . Smokeless tobacco: Never Used  Substance and Sexual Activity  . Alcohol use: Yes  . Drug use: No    Comment: previous crack cocaine "quit 4 years ago"  . Sexual activity: Yes  Lifestyle  . Physical  activity    Days per week: Not on file    Minutes per session: Not on file  . Stress: Not on file  Relationships  . Social Musicianconnections    Talks on phone: Not on file    Gets together: Not on file    Attends religious service: Not on file    Active member of club or organization: Not on file    Attends meetings of clubs or organizations: Not on file    Relationship status: Not on file  Other Topics Concern  . Not on file  Social History Narrative  . Not on file     Family History: The patient's family history includes CAD in his father; Cancer in his maternal grandfather; Diabetes in his father; Heart disease in his father and paternal grandfather; Hypertension in his father; Lung disease in his mother; Peripheral Artery Disease in his father; Stroke in his father. ROS:   Please see the history of present illness.    All 14 point review of systems negative except as described per history of present illness  EKGs/Labs/Other Studies Reviewed:    Atrial fibrillation with fast ventricular rate   Echocardiogram done on 10/30/2018 showed: Summary Technically limited study. Ejection fraction is estimated at  40% Global LV Hypokinesis, Abnormal septal motion consistent with RV pacemaker. Mild concentric left ventricular hypertrophy Little change vs previous echo  Recent Labs: No results found for requested labs within last 8760 hours.  Recent Lipid Panel    Component Value Date/Time   CHOL 114 05/08/2012 0420   TRIG 54 05/08/2012 0420   HDL 34 (L) 05/08/2012 0420   CHOLHDL 3.4 05/08/2012 0420   VLDL 11 05/08/2012 0420   LDLCALC 69 05/08/2012 0420    Physical Exam:    VS:  BP 104/90 (BP Location: Left Arm, Patient Position: Sitting, Cuff Size: Normal)   Pulse (!) 101   Ht 5\' 11"  (1.803 m)   Wt 251 lb (113.9 kg)   SpO2 92%   BMI 35.01 kg/m     Wt Readings from Last 3 Encounters:  03/04/19 251 lb (113.9 kg)  02/23/19 249 lb 12.8 oz (113.3 kg)  05/26/18 248 lb (112.5 kg)     GEN:  Well nourished, well developed in no acute distress HEENT: Normal NECK: No JVD; No carotid bruits LYMPHATICS: No lymphadenopathy CARDIAC: Irregularly irregular, no murmurs, no rubs, no gallops RESPIRATORY:  Clear to auscultation without rales, wheezing or rhonchi  ABDOMEN: Soft, non-tender, non-distended MUSCULOSKELETAL:  No edema; No deformity  SKIN: Warm and dry LOWER EXTREMITIES: no swelling NEUROLOGIC:  Alert and oriented x 3 PSYCHIATRIC:  Normal affect   ASSESSMENT:    1. Paroxysmal atrial fibrillation (HCC)   2. Pre-procedure lab exam   3. Pacemaker [place] device implanted in summer 2020   4. Presence of prosthetic heart valve   5. Status post aortic valve replacement    PLAN:    In order of problems listed above:  1. Paroxysmal atrial fibrillation last time I try to increase dose of amiodarone to 200 g 3 times daily hoping that he will convert with higher dose of amiodarone however it did not happen and he feels poorly.  We talked a long time today about what to do with the situation we elected to proceed with cardioversion procedure has been explained to him including all risk  benefits as well as alternatives.  We did got his INR from primary care physician for last month all were within therapeutic  range.  We will make arrangements for [facility] rep to come and interrogate device after cardioversion.  Will maintain anticoagulation on present dose of amiodarone. 2. Cardiomyopathy last estimation of ejection fraction showed 40% that was after he was placed and thinking was that partial reason for this is asynchronous pacing by device. 3. Presence of prosthetic valve.  Normal function.   Medication Adjustments/Labs and Tests Ordered: Current medicines are reviewed at length with the patient today.  Concerns regarding medicines are outlined above.  Orders Placed This Encounter  Procedures  . Basic Metabolic Panel (BMET)  . CBC  . Protime-INR ( [facility] ONLY)  . EKG 12-Lead   Medication changes: No orders of the defined types were placed in this encounter.   Signed, Georgeanna Lea, MD, [facility] 03/04/2019 3:13 PM    [facility]

## 2019-03-05 ENCOUNTER — Telehealth: Payer: Self-pay | Admitting: Cardiology

## 2019-03-05 DIAGNOSIS — I480 Paroxysmal atrial fibrillation: Secondary | ICD-10-CM

## 2019-03-05 LAB — PROTIME-INR
INR: 2.2 — ABNORMAL HIGH (ref 0.9–1.2)
Prothrombin Time: 23 s — ABNORMAL HIGH (ref 9.1–12.0)

## 2019-03-05 LAB — BASIC METABOLIC PANEL
BUN/Creatinine Ratio: 12 (ref 9–20)
BUN: 18 mg/dL (ref 6–24)
CO2: 27 mmol/L (ref 20–29)
Calcium: 9.5 mg/dL (ref 8.7–10.2)
Chloride: 104 mmol/L (ref 96–106)
Creatinine, Ser: 1.47 mg/dL — ABNORMAL HIGH (ref 0.76–1.27)
GFR calc Af Amer: 62 mL/min/{1.73_m2} (ref >59)
GFR calc non Af Amer: 54 mL/min/{1.73_m2} — ABNORMAL LOW (ref >59)
Glucose: 87 mg/dL (ref 65–99)
Potassium: 4.9 mmol/L (ref 3.5–5.2)
Sodium: 143 mmol/L (ref 134–144)

## 2019-03-05 LAB — CBC
Hematocrit: 42.7 % (ref 37.5–51.0)
Hemoglobin: 13.8 g/dL (ref 13.0–17.7)
MCH: 30.3 pg (ref 26.6–33.0)
MCHC: 32.3 g/dL (ref 31.5–35.7)
MCV: 94 fL (ref 79–97)
Platelets: 252 10*3/uL (ref 150–450)
RBC: 4.55 x10E6/uL (ref 4.14–5.80)
RDW: 13.9 % (ref 11.6–15.4)
WBC: 7.8 10*3/uL (ref 3.4–10.8)

## 2019-03-05 NOTE — Telephone Encounter (Signed)
Called patient, he was supposed to be cardioverted this morning however upon arrival he was in normal sinus rhythm. I explained that they cannot cardiovert him if he is in a normal rhythm. Explained situation to Dr. Agustin Cree he advises the patient continue the medication amiodarone 200 mg three times daily for now to give it time to work. Patient verbally understands. Will call and check on him tomorrow.

## 2019-03-05 NOTE — Telephone Encounter (Signed)
Went to the hospital to have his heart shocked but his heartrate was 171 and he is panicking

## 2019-03-06 NOTE — Telephone Encounter (Signed)
Called patient's mother informed her that we have heard from the most recent transmission that was sent through today. Dr. Agustin Cree aware of the readings and advises we have him continue medication for as as discussed and follow up on Monday. She verbally understood. No further questions.

## 2019-03-06 NOTE — Telephone Encounter (Signed)
Called patient back to see how is doing. He is still feeling weak and having palpitations that come and go, he is miserable. His heart rate today has been from  90-101. Patient is still taking amiodarone 200 mg three times daily. He wants to know if he can try a different medicine since he is not getting any relief. He has also looked up his symptoms online and is worried this is SVT.

## 2019-03-09 NOTE — Telephone Encounter (Signed)
Left message for patient to return call.

## 2019-03-10 NOTE — Addendum Note (Signed)
Addended by: Linna Hoff R on: 03/10/2019 10:20 AM   Modules accepted: Orders

## 2019-03-10 NOTE — Telephone Encounter (Signed)
Patients mother aware of ep appointment she will inform him when he gets out of the hospital. No further questions.

## 2019-03-10 NOTE — Telephone Encounter (Signed)
Called patients mother informed her that Dr. Agustin Cree advises the patient be seen with EP due to being in atrial flutter 74%of the time based on last pacemaker transmission. Referral has been placed and she is expecting a call with the appointment. During the call she informed me to patient is currently in the hospital with a COPD issue. Advised her to call us if they need anything further.

## 2019-03-13 MED ORDER — FUROSEMIDE 40 MG PO TABS
40.00 | ORAL_TABLET | ORAL | Status: DC
Start: 2019-03-13 — End: 2019-03-13

## 2019-03-13 MED ORDER — PANTOPRAZOLE SODIUM 40 MG PO TBEC
40.00 | DELAYED_RELEASE_TABLET | ORAL | Status: DC
Start: 2019-03-13 — End: 2019-03-13

## 2019-03-13 MED ORDER — ACETAMINOPHEN 325 MG PO TABS
650.00 | ORAL_TABLET | ORAL | Status: DC
Start: ? — End: 2019-03-13

## 2019-03-13 MED ORDER — BUPROPION HCL ER (SR) 150 MG PO TB12
150.00 | ORAL_TABLET | ORAL | Status: DC
Start: 2019-03-12 — End: 2019-03-13

## 2019-03-13 MED ORDER — AMIODARONE HCL 200 MG PO TABS
200.00 | ORAL_TABLET | ORAL | Status: DC
Start: 2019-03-12 — End: 2019-03-13

## 2019-03-13 MED ORDER — GABAPENTIN 300 MG PO CAPS
300.00 | ORAL_CAPSULE | ORAL | Status: DC
Start: 2019-03-12 — End: 2019-03-13

## 2019-03-13 MED ORDER — GENERIC EXTERNAL MEDICATION
60.00 | Status: DC
Start: 2019-03-13 — End: 2019-03-13

## 2019-03-13 MED ORDER — WARFARIN SODIUM 2.5 MG PO TABS
2.50 | ORAL_TABLET | ORAL | Status: DC
Start: ? — End: 2019-03-13

## 2019-03-13 MED ORDER — LEVOTHYROXINE SODIUM 50 MCG PO TABS
50.00 | ORAL_TABLET | ORAL | Status: DC
Start: 2019-03-13 — End: 2019-03-13

## 2019-03-13 MED ORDER — CLONIDINE HCL 0.1 MG PO TABS
0.10 | ORAL_TABLET | ORAL | Status: DC
Start: ? — End: 2019-03-13

## 2019-03-13 MED ORDER — BUDESONIDE 0.5 MG/2ML IN SUSP
0.50 | RESPIRATORY_TRACT | Status: DC
Start: 2019-03-12 — End: 2019-03-13

## 2019-03-13 MED ORDER — BUSPIRONE HCL 15 MG PO TABS
15.00 | ORAL_TABLET | ORAL | Status: DC
Start: 2019-03-12 — End: 2019-03-13

## 2019-03-13 MED ORDER — TOPIRAMATE 25 MG PO TABS
50.00 | ORAL_TABLET | ORAL | Status: DC
Start: 2019-03-13 — End: 2019-03-13

## 2019-03-13 MED ORDER — DICYCLOMINE HCL 20 MG PO TABS
20.00 | ORAL_TABLET | ORAL | Status: DC
Start: 2019-03-12 — End: 2019-03-13

## 2019-03-13 MED ORDER — ALBUTEROL SULFATE HFA 108 (90 BASE) MCG/ACT IN AERS
2.00 | INHALATION_SPRAY | RESPIRATORY_TRACT | Status: DC
Start: ? — End: 2019-03-13

## 2019-03-13 MED ORDER — GENERIC EXTERNAL MEDICATION
Status: DC
Start: 2019-03-12 — End: 2019-03-13

## 2019-03-13 MED ORDER — DOXYCYCLINE HYCLATE 100 MG PO TABS
100.00 | ORAL_TABLET | ORAL | Status: DC
Start: 2019-03-12 — End: 2019-03-13

## 2019-03-13 MED ORDER — BENZOCAINE-MENTHOL 6-10 MG MT LOZG
1.00 | LOZENGE | OROMUCOSAL | Status: DC
Start: ? — End: 2019-03-13

## 2019-03-13 MED ORDER — NITROGLYCERIN 0.4 MG SL SUBL
0.40 | SUBLINGUAL_TABLET | SUBLINGUAL | Status: DC
Start: ? — End: 2019-03-13

## 2019-03-13 MED ORDER — ATORVASTATIN CALCIUM 40 MG PO TABS
80.00 | ORAL_TABLET | ORAL | Status: DC
Start: 2019-03-12 — End: 2019-03-13

## 2019-03-26 ENCOUNTER — Telehealth: Payer: Self-pay

## 2019-03-26 NOTE — Telephone Encounter (Signed)
Pt has hx of 3 strokes that have left him with memory/understanding issues.  Mother aware that is ok for her to accompany pt to appt tomorrow. She appreciates our consideration on this matter.

## 2019-03-26 NOTE — Telephone Encounter (Signed)
New message    Patient mother states that he needs assistance coming to his appt, he can not walk without it. She will need to come up with him? Please advise

## 2019-03-27 ENCOUNTER — Encounter: Payer: Self-pay | Admitting: Cardiology

## 2019-03-27 ENCOUNTER — Ambulatory Visit: Payer: Medicaid Other | Admitting: Cardiology

## 2019-03-27 ENCOUNTER — Telehealth: Payer: Self-pay | Admitting: Emergency Medicine

## 2019-03-27 ENCOUNTER — Ambulatory Visit (INDEPENDENT_AMBULATORY_CARE_PROVIDER_SITE_OTHER): Payer: Medicaid Other | Admitting: Cardiology

## 2019-03-27 ENCOUNTER — Other Ambulatory Visit: Payer: Self-pay

## 2019-03-27 ENCOUNTER — Encounter (INDEPENDENT_AMBULATORY_CARE_PROVIDER_SITE_OTHER): Payer: Self-pay

## 2019-03-27 VITALS — BP 126/82 | HR 104 | Ht 71.0 in | Wt 248.4 lb

## 2019-03-27 DIAGNOSIS — I480 Paroxysmal atrial fibrillation: Secondary | ICD-10-CM

## 2019-03-27 DIAGNOSIS — Z9229 Personal history of other drug therapy: Secondary | ICD-10-CM

## 2019-03-27 DIAGNOSIS — I4819 Other persistent atrial fibrillation: Secondary | ICD-10-CM

## 2019-03-27 NOTE — Progress Notes (Signed)
Electrophysiology Office Note   Date:  03/27/2019   ID:  Zabdiel, [place] November 22, 1965, MRN [id number]  PCP:  Guadalupe Maple., MD  Cardiologist:  Bing Matter Primary Electrophysiologist:  Will Jorja Loa, MD    Chief Complaint: AF   History of Present Illness: Jon [name] is a 53 y.o. male who is being seen today for the evaluation of AF at the request of Jon Lea, MD. Presenting today for electrophysiology evaluation.  He has a complex past medical history.  He had an aortic rupture with emergent surgery in 2013.  He had an aortic valve replacement at the time.  His surgery was complicated by stroke.  He went to [facility] due to complete heart block and had a pacemaker implanted at that time.  He then presented to cardiology clinic in atrial fibrillation complaining of weakness and fatigue.  He was found to be in atrial fibrillation.  He has been in atrial fibrillation for the last 3 months.  Today, he denies symptoms of palpitations, chest pain,  lower extremity edema, claudication, dizziness, presyncope, syncope, bleeding, or neurologic sequela. The patient is tolerating medications without difficulties.    Past Medical History:  Diagnosis Date  . Aneurysm of thoracic aorta (HCC) 05/06/2012   IMO routine update IMO routine update  . Anxiety    sees Dr. Renae Fickle  . Aortic aneurysm and dissection (HCC) 05/06/2012  . Aortic stenosis    mechanical AVR 04/22/12  . Aortic valve disorder 05/06/2012  . 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 Bicuspid aortic valve   . Cerebral infarction (HCC) 05/11/2012  . Cerebral thrombosis with cerebral infarction (HCC) 07/14/2013  . Cholelithiasis 12/20/2016  . Current use of long term anticoagulation 12/20/2016  . CVA (cerebral infarction)    occipital CVA 12/13  . Dissecting aortic aneurysm (HCC) 05/06/2012  . Dysarthria 09/10/2017  . Dysrhythmia    sees Dr. Geronimo Running, [facility]  cardiology in [place]  . Generalized constriction of visual field 10/15/2012   IMOUPDATE IMOUPDATE  . 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 GERD (gastroesophageal reflux disease)   . HCAP (healthcare-associated pneumonia) 05/06/2012  . Headache(784.0)    "due to vision problem"  . History of CVA (cerebrovascular accident) 05/07/2012  . Hypothyroidism   . Idiopathic peripheral neuropathy 07/14/2013  . Late effects of CVA (cerebrovascular accident) 08/08/2015  . Nontraumatic multiple localized intracerebral hemorrhages (HCC) 10/16/2017  . Paroxysmal atrial fibrillation (HCC) 08/08/2015   On warfarin On warfarin  . Presence of prosthetic heart valve 08/08/2015  . Shortness of breath   . Stroke (HCC)    x3  . Stroke, embolic (HCC) 05/07/2012  . Syncope 05/06/2012  . Thoracic aortic aneurysm (HCC)    replacement aortic root 04/22/12  . VBI (vertebrobasilar insufficiency) 09/10/2017   Past Surgical History:  Procedure Laterality Date  . APPLICATION OF WOUND VAC  06/11/2012   Procedure: APPLICATION OF WOUND VAC;  Surgeon: [name], MD;  Location: [facility] OR;  Service: Vascular;  Laterality: Right;  . CARDIAC CATHETERIZATION    . CHOLECYSTECTOMY    . CORONARY ARTERY BYPASS GRAFT  04/22/2012   Procedure: CORONARY ARTERY BYPASS GRAFTING (CABG);  Surgeon: [name], MD;  Location: [facility] OR;  Service: Open Heart Surgery;  Laterality: N/A;  times one with right greater saphenous vein graft  . I&D EXTREMITY  06/11/2012   Procedure: IRRIGATION AND DEBRIDEMENT EXTREMITY;  Surgeon: [name], MD;  Location: [facility] OR;  Service: Vascular;  Laterality: Right;  . INTRAOPERATIVE TRANSESOPHAGEAL ECHOCARDIOGRAM  04/22/2012   Procedure: INTRAOPERATIVE TRANSESOPHAGEAL ECHOCARDIOGRAM;  Surgeon: Loreli SlotSteven C Hendrickson, MD;  [facility];  Service: Open Heart Surgery;  Laterality: N/A;  . ROTATOR CUFF REPAIR     12 years ago, Left  . THORACIC AORTIC ANEURYSM REPAIR  04/22/2012    Procedure: THORACIC ASCENDING ANEURYSM REPAIR (AAA);  Surgeon: Loreli SlotSteven C Hendrickson, MD;  [facility];  Service: Open Heart Surgery;  Laterality: N/A;     Current Outpatient Medications  Medication Sig Dispense Refill  . acetaminophen (TYLENOL) 500 MG tablet Take by mouth.    Marland Kitchen. amiodarone (PACERONE) 200 MG tablet Take 1 tablet (200 mg total) by mouth 3 (three) times daily. 90 tablet 1  . aspirin 81 MG chewable tablet Chew 1 tablet (81 mg total) by mouth daily.    . busPIRone (BUSPAR) 15 MG tablet Take 15 mg by mouth 2 (two) times daily.     . cyclobenzaprine (FLEXERIL) 10 MG tablet TK 1 T PO TID  0  . DULoxetine (CYMBALTA) 60 MG capsule Take by mouth.    . furosemide (LASIX) 20 MG tablet Take 20 mg by mouth 2 (two) times daily. Takes 2 in am and 1 at lunch    . gabapentin (NEURONTIN) 300 MG capsule Take 300 mg by mouth 2 (two) times daily. Takes 1 in am and 2 in pm    . ibuprofen (ADVIL,MOTRIN) 600 MG tablet Take 600 mg by mouth every 8 (eight) hours.    Marland Kitchen. levothyroxine (SYNTHROID, LEVOTHROID) 50 MCG tablet Take 50 mcg by mouth daily before breakfast.    . montelukast (SINGULAIR) 10 MG tablet Take 10 mg by mouth at bedtime.    Marland Kitchen. omeprazole (PRILOSEC) 20 MG capsule Take by mouth.    . potassium chloride (K-DUR) 10 MEQ tablet Take by mouth daily.    Marland Kitchen. PROAIR HFA 108 (90 Base) MCG/ACT inhaler INHALE 2 PUFFS BY MOUTH EVERY 4 TO 6 HOURS AS NEEDED  12  . rosuvastatin (CRESTOR) 40 MG tablet Take 40 mg by mouth daily.  12  . topiramate (TOPAMAX) 50 MG tablet TAKE 1 TABLET BY MOUTH IN THE MORNING FOR 7 DAYS THEN TAKE 1 TABLET BY MOUTH TWICE DAILY  5  . warfarin (COUMADIN) 5 MG tablet TAKE 1 & 1/2 TO 2 TABS BY MOUTH DAILY AS DIRECTED BY COUMADIN CLINIC 65 tablet 0  . buPROPion (WELLBUTRIN SR) 150 MG 12 hr tablet Take 1 tablet by mouth 2 (two) times daily.     No current facility-administered medications for this visit.     Allergies:   Hydrocodone-acetaminophen, Hydromorphone, Other, Oxycodone,  Penicillins, and Dilaudid [hydromorphone hcl]   Social History:  The patient  reports that he has been smoking cigarettes. He started smoking about 6 years ago. He has been smoking about 2.50 packs per day. He has never used smokeless tobacco. He reports current alcohol use. He reports that he does not use drugs.   Family History:  The patient's family history includes CAD in his father; Cancer in his maternal grandfather; Diabetes in his father; Heart disease in his father and paternal grandfather; Hypertension in his father; Lung disease in his mother; Peripheral Artery Disease in his father; Stroke in his father.    ROS:  Please see the history of present illness.   Otherwise, review of systems is positive for none.   All other systems are reviewed and negative.    PHYSICAL EXAM: VS:  BP  126/82   Pulse (!) 104   Ht 5\' 11"  (1.803 m)   Wt 248 lb 6.4 oz (112.7 kg)   SpO2 99%   BMI 34.64 kg/m  , BMI Body mass index is 34.64 kg/m. GEN: Well nourished, well developed, in no acute distress  HEENT: normal  Neck: no JVD, carotid bruits, or masses Cardiac: RRR; no murmurs, rubs, or gallops,no edema  Respiratory:  clear to auscultation bilaterally, normal work of breathing GI: soft, nontender, nondistended, + BS MS: no deformity or atrophy  Skin: warm and dry, device pocket is well healed Neuro:  Strength and sensation are intact Psych: euthymic mood, full affect  EKG:  EKG is not ordered today. Personal review of the ekg ordered 03/04/2019 shows atrial fibrillation, right bundle branch block, left anterior fascicular block  Device interrogation is reviewed today in detail.  See PaceArt for details.   Recent Labs: 03/04/2019: BUN 18; Creatinine, Ser 1.47; Hemoglobin 13.8; Platelets 252; Potassium 4.9; Sodium 143    Lipid Panel     Component Value Date/Time   CHOL 114 05/08/2012 0420   TRIG 54 05/08/2012 0420   HDL 34 (L) 05/08/2012 0420   CHOLHDL 3.4 05/08/2012 0420   VLDL 11  05/08/2012 0420   LDLCALC 69 05/08/2012 0420     Wt Readings from Last 3 Encounters:  03/27/19 248 lb 6.4 oz (112.7 kg)  03/04/19 251 lb (113.9 kg)  02/23/19 249 lb 12.8 oz (113.3 kg)      Other studies Reviewed: Additional studies/ records that were reviewed today include: TTE 05/01/18  Review of the above records today demonstrates:  - Left ventricle: The cavity size was normal. Wall thickness was   increased in a pattern of mild LVH. Systolic function was normal.   The estimated ejection fraction was in the range of 55% to 60%.   Wall motion was normal; there were no regional wall motion   abnormalities. - Left atrium: The atrium was moderately dilated.   ASSESSMENT AND PLAN:  1.  Persistent atrial fibrillation: Currently on amiodarone and warfarin.  Unfortunately he is in atrial fibrillation currently.  He is feeling weak and fatigued.  Due to that, we will plan for cardioversion.  I discussed this with his referring cardiologist who will arrange the procedure in [place].  This patients CHA2DS2-VASc Score and unadjusted Ischemic Stroke Rate (% per year) is equal to 2.2 % stroke rate/year from a score of 2  Above score calculated as 1 point each if present [CHF, HTN, DM, Vascular=MI/PAD/Aortic Plaque, Age if 65-74, or Male] Above score calculated as 2 points each if present [Age > 75, or Stroke/TIA/TE]  2.  Complete heart block: Status post Saint Jude dual-chamber pacemaker implanted in the summer 2020.  Device functioning appropriately.  No changes.  Current medicines are reviewed at length with the patient today.   The patient does not have concerns regarding his medicines.  The following changes were made today:  none  Labs/ tests ordered today include:  No orders of the defined types were placed in this encounter.    Disposition:   FU with Will Camnitz 6 months  Signed, Will Meredith Leeds, MD  03/27/2019 1:46 PM     [address] [place] [id number] [phone] (office) [phone] (fax)

## 2019-03-27 NOTE — Telephone Encounter (Signed)
Called patient informed him per Dr. Agustin Cree to come have labs drawn and we will decide on cardioversion.

## 2019-03-29 ENCOUNTER — Other Ambulatory Visit: Payer: Self-pay | Admitting: Cardiology

## 2019-03-30 ENCOUNTER — Telehealth: Payer: Self-pay | Admitting: Emergency Medicine

## 2019-03-30 ENCOUNTER — Encounter: Payer: Self-pay | Admitting: Emergency Medicine

## 2019-03-30 LAB — BASIC METABOLIC PANEL
BUN/Creatinine Ratio: 9 (ref 9–20)
BUN: 13 mg/dL (ref 6–24)
CO2: 22 mmol/L (ref 20–29)
Calcium: 9.4 mg/dL (ref 8.7–10.2)
Chloride: 101 mmol/L (ref 96–106)
Creatinine, Ser: 1.47 mg/dL — ABNORMAL HIGH (ref 0.76–1.27)
GFR calc Af Amer: 62 mL/min/{1.73_m2} (ref >59)
GFR calc non Af Amer: 54 mL/min/{1.73_m2} — ABNORMAL LOW (ref >59)
Glucose: 113 mg/dL — ABNORMAL HIGH (ref 65–99)
Potassium: 5 mmol/L (ref 3.5–5.2)
Sodium: 140 mmol/L (ref 134–144)

## 2019-03-30 LAB — PROTIME-INR
INR: 2.2 — ABNORMAL HIGH (ref 0.9–1.2)
Prothrombin Time: 23.2 s — ABNORMAL HIGH (ref 9.1–12.0)

## 2019-03-30 NOTE — Telephone Encounter (Signed)
Patient in office for labs today and asking for a note so he doesn't have to go to court Wednesday. Patient informed that letter is ready and signed by Dr. Agustin Cree. Patient verbally understands and will pick up letter.

## 2019-03-30 NOTE — Telephone Encounter (Signed)
Pls review/refill coumadin as requested, tx

## 2019-04-13 ENCOUNTER — Institutional Professional Consult (permissible substitution): Payer: Medicaid Other | Admitting: Cardiology

## 2019-04-14 ENCOUNTER — Ambulatory Visit: Payer: Medicaid Other | Admitting: Cardiology

## 2019-04-29 ENCOUNTER — Inpatient Hospital Stay (HOSPITAL_COMMUNITY): Admission: AD | Admit: 2019-04-29 | Payer: Medicaid Other | Admitting: Family Medicine

## 2019-04-29 DIAGNOSIS — J449 Chronic obstructive pulmonary disease, unspecified: Secondary | ICD-10-CM | POA: Diagnosis not present

## 2019-04-29 DIAGNOSIS — E039 Hypothyroidism, unspecified: Secondary | ICD-10-CM | POA: Diagnosis not present

## 2019-04-29 DIAGNOSIS — J9601 Acute respiratory failure with hypoxia: Secondary | ICD-10-CM | POA: Diagnosis not present

## 2019-04-29 DIAGNOSIS — I10 Essential (primary) hypertension: Secondary | ICD-10-CM

## 2019-04-30 DIAGNOSIS — E039 Hypothyroidism, unspecified: Secondary | ICD-10-CM | POA: Diagnosis not present

## 2019-04-30 DIAGNOSIS — I361 Nonrheumatic tricuspid (valve) insufficiency: Secondary | ICD-10-CM

## 2019-04-30 DIAGNOSIS — J449 Chronic obstructive pulmonary disease, unspecified: Secondary | ICD-10-CM | POA: Diagnosis not present

## 2019-04-30 DIAGNOSIS — I340 Nonrheumatic mitral (valve) insufficiency: Secondary | ICD-10-CM

## 2019-04-30 DIAGNOSIS — J9601 Acute respiratory failure with hypoxia: Secondary | ICD-10-CM | POA: Diagnosis not present

## 2019-04-30 DIAGNOSIS — I10 Essential (primary) hypertension: Secondary | ICD-10-CM | POA: Diagnosis not present

## 2019-05-01 DIAGNOSIS — E039 Hypothyroidism, unspecified: Secondary | ICD-10-CM | POA: Diagnosis not present

## 2019-05-01 DIAGNOSIS — J9601 Acute respiratory failure with hypoxia: Secondary | ICD-10-CM | POA: Diagnosis not present

## 2019-05-01 DIAGNOSIS — I10 Essential (primary) hypertension: Secondary | ICD-10-CM | POA: Diagnosis not present

## 2019-05-01 DIAGNOSIS — J449 Chronic obstructive pulmonary disease, unspecified: Secondary | ICD-10-CM | POA: Diagnosis not present

## 2019-05-02 DIAGNOSIS — I10 Essential (primary) hypertension: Secondary | ICD-10-CM | POA: Diagnosis not present

## 2019-05-02 DIAGNOSIS — J9601 Acute respiratory failure with hypoxia: Secondary | ICD-10-CM | POA: Diagnosis not present

## 2019-05-02 DIAGNOSIS — J449 Chronic obstructive pulmonary disease, unspecified: Secondary | ICD-10-CM | POA: Diagnosis not present

## 2019-05-02 DIAGNOSIS — E039 Hypothyroidism, unspecified: Secondary | ICD-10-CM | POA: Diagnosis not present

## 2019-05-03 DIAGNOSIS — I10 Essential (primary) hypertension: Secondary | ICD-10-CM | POA: Diagnosis not present

## 2019-05-03 DIAGNOSIS — J9601 Acute respiratory failure with hypoxia: Secondary | ICD-10-CM | POA: Diagnosis not present

## 2019-05-03 DIAGNOSIS — E039 Hypothyroidism, unspecified: Secondary | ICD-10-CM | POA: Diagnosis not present

## 2019-05-03 DIAGNOSIS — J449 Chronic obstructive pulmonary disease, unspecified: Secondary | ICD-10-CM | POA: Diagnosis not present

## 2019-05-04 DIAGNOSIS — I429 Cardiomyopathy, unspecified: Secondary | ICD-10-CM

## 2019-05-04 DIAGNOSIS — I10 Essential (primary) hypertension: Secondary | ICD-10-CM | POA: Diagnosis not present

## 2019-05-04 DIAGNOSIS — J449 Chronic obstructive pulmonary disease, unspecified: Secondary | ICD-10-CM | POA: Diagnosis not present

## 2019-05-04 DIAGNOSIS — I480 Paroxysmal atrial fibrillation: Secondary | ICD-10-CM

## 2019-05-04 DIAGNOSIS — Z952 Presence of prosthetic heart valve: Secondary | ICD-10-CM

## 2019-05-04 DIAGNOSIS — J189 Pneumonia, unspecified organism: Secondary | ICD-10-CM

## 2019-05-04 DIAGNOSIS — E039 Hypothyroidism, unspecified: Secondary | ICD-10-CM | POA: Diagnosis not present

## 2019-05-04 DIAGNOSIS — Z950 Presence of cardiac pacemaker: Secondary | ICD-10-CM

## 2019-05-04 DIAGNOSIS — Z7901 Long term (current) use of anticoagulants: Secondary | ICD-10-CM

## 2019-05-04 DIAGNOSIS — J9601 Acute respiratory failure with hypoxia: Secondary | ICD-10-CM | POA: Diagnosis not present

## 2019-05-05 DIAGNOSIS — Z7901 Long term (current) use of anticoagulants: Secondary | ICD-10-CM | POA: Diagnosis not present

## 2019-05-05 DIAGNOSIS — J9601 Acute respiratory failure with hypoxia: Secondary | ICD-10-CM | POA: Diagnosis not present

## 2019-05-05 DIAGNOSIS — I10 Essential (primary) hypertension: Secondary | ICD-10-CM | POA: Diagnosis not present

## 2019-05-05 DIAGNOSIS — Z950 Presence of cardiac pacemaker: Secondary | ICD-10-CM | POA: Diagnosis not present

## 2019-05-05 DIAGNOSIS — I480 Paroxysmal atrial fibrillation: Secondary | ICD-10-CM | POA: Diagnosis not present

## 2019-05-05 DIAGNOSIS — I429 Cardiomyopathy, unspecified: Secondary | ICD-10-CM | POA: Diagnosis not present

## 2019-05-05 DIAGNOSIS — J449 Chronic obstructive pulmonary disease, unspecified: Secondary | ICD-10-CM | POA: Diagnosis not present

## 2019-05-05 DIAGNOSIS — E039 Hypothyroidism, unspecified: Secondary | ICD-10-CM | POA: Diagnosis not present

## 2019-05-06 DIAGNOSIS — J449 Chronic obstructive pulmonary disease, unspecified: Secondary | ICD-10-CM | POA: Diagnosis not present

## 2019-05-06 DIAGNOSIS — I10 Essential (primary) hypertension: Secondary | ICD-10-CM | POA: Diagnosis not present

## 2019-05-06 DIAGNOSIS — E039 Hypothyroidism, unspecified: Secondary | ICD-10-CM | POA: Diagnosis not present

## 2019-05-06 DIAGNOSIS — J9601 Acute respiratory failure with hypoxia: Secondary | ICD-10-CM | POA: Diagnosis not present

## 2019-05-07 DIAGNOSIS — I429 Cardiomyopathy, unspecified: Secondary | ICD-10-CM | POA: Diagnosis not present

## 2019-05-07 DIAGNOSIS — J9601 Acute respiratory failure with hypoxia: Secondary | ICD-10-CM | POA: Diagnosis not present

## 2019-05-07 DIAGNOSIS — Z7901 Long term (current) use of anticoagulants: Secondary | ICD-10-CM | POA: Diagnosis not present

## 2019-05-07 DIAGNOSIS — J449 Chronic obstructive pulmonary disease, unspecified: Secondary | ICD-10-CM | POA: Diagnosis not present

## 2019-05-07 DIAGNOSIS — I480 Paroxysmal atrial fibrillation: Secondary | ICD-10-CM | POA: Diagnosis not present

## 2019-05-07 DIAGNOSIS — Z950 Presence of cardiac pacemaker: Secondary | ICD-10-CM | POA: Diagnosis not present

## 2019-05-07 DIAGNOSIS — E039 Hypothyroidism, unspecified: Secondary | ICD-10-CM | POA: Diagnosis not present

## 2019-05-07 DIAGNOSIS — I10 Essential (primary) hypertension: Secondary | ICD-10-CM | POA: Diagnosis not present

## 2019-05-08 ENCOUNTER — Inpatient Hospital Stay (HOSPITAL_COMMUNITY)
Admission: EM | Admit: 2019-05-08 | Discharge: 2019-05-13 | DRG: 023 | Disposition: A | Discharge status: Rehabilitation Facility | Payer: Medicaid Other | Attending: Neurology | Admitting: Neurology

## 2019-05-08 ENCOUNTER — Other Ambulatory Visit: Payer: Self-pay

## 2019-05-08 ENCOUNTER — Inpatient Hospital Stay (HOSPITAL_COMMUNITY): Payer: Medicaid Other | Admitting: Anesthesiology

## 2019-05-08 ENCOUNTER — Emergency Department (HOSPITAL_COMMUNITY): Payer: Medicaid Other

## 2019-05-08 ENCOUNTER — Encounter (HOSPITAL_COMMUNITY)
Admission: EM | Disposition: A | Discharge status: Rehabilitation Facility | Payer: Self-pay | Source: Home / Self Care | Attending: Neurology

## 2019-05-08 ENCOUNTER — Inpatient Hospital Stay (HOSPITAL_COMMUNITY): Payer: Medicaid Other

## 2019-05-08 DIAGNOSIS — F329 Major depressive disorder, single episode, unspecified: Secondary | ICD-10-CM

## 2019-05-08 DIAGNOSIS — I350 Nonrheumatic aortic (valve) stenosis: Secondary | ICD-10-CM | POA: Diagnosis present

## 2019-05-08 DIAGNOSIS — I6990 Unspecified sequelae of unspecified cerebrovascular disease: Secondary | ICD-10-CM

## 2019-05-08 DIAGNOSIS — Z978 Presence of other specified devices: Secondary | ICD-10-CM

## 2019-05-08 DIAGNOSIS — E785 Hyperlipidemia, unspecified: Secondary | ICD-10-CM | POA: Diagnosis present

## 2019-05-08 DIAGNOSIS — Z8679 Personal history of other diseases of the circulatory system: Secondary | ICD-10-CM

## 2019-05-08 DIAGNOSIS — Z8249 Family history of ischemic heart disease and other diseases of the circulatory system: Secondary | ICD-10-CM

## 2019-05-08 DIAGNOSIS — R471 Dysarthria and anarthria: Secondary | ICD-10-CM | POA: Diagnosis not present

## 2019-05-08 DIAGNOSIS — Z6832 Body mass index (BMI) 32.0-32.9, adult: Secondary | ICD-10-CM

## 2019-05-08 DIAGNOSIS — I7100 Dissection of unspecified site of aorta: Secondary | ICD-10-CM | POA: Diagnosis present

## 2019-05-08 DIAGNOSIS — R0602 Shortness of breath: Secondary | ICD-10-CM

## 2019-05-08 DIAGNOSIS — J690 Pneumonitis due to inhalation of food and vomit: Secondary | ICD-10-CM | POA: Diagnosis not present

## 2019-05-08 DIAGNOSIS — I2510 Atherosclerotic heart disease of native coronary artery without angina pectoris: Secondary | ICD-10-CM | POA: Diagnosis present

## 2019-05-08 DIAGNOSIS — E46 Unspecified protein-calorie malnutrition: Secondary | ICD-10-CM | POA: Diagnosis not present

## 2019-05-08 DIAGNOSIS — Z79890 Hormone replacement therapy (postmenopausal): Secondary | ICD-10-CM

## 2019-05-08 DIAGNOSIS — Z8673 Personal history of transient ischemic attack (TIA), and cerebral infarction without residual deficits: Secondary | ICD-10-CM

## 2019-05-08 DIAGNOSIS — I639 Cerebral infarction, unspecified: Secondary | ICD-10-CM | POA: Diagnosis present

## 2019-05-08 DIAGNOSIS — Z9981 Dependence on supplemental oxygen: Secondary | ICD-10-CM | POA: Diagnosis not present

## 2019-05-08 DIAGNOSIS — E441 Mild protein-calorie malnutrition: Secondary | ICD-10-CM | POA: Diagnosis present

## 2019-05-08 DIAGNOSIS — Z952 Presence of prosthetic heart valve: Secondary | ICD-10-CM

## 2019-05-08 DIAGNOSIS — G43909 Migraine, unspecified, not intractable, without status migrainosus: Secondary | ICD-10-CM

## 2019-05-08 DIAGNOSIS — F32A Depression, unspecified: Secondary | ICD-10-CM

## 2019-05-08 DIAGNOSIS — G92 Toxic encephalopathy: Secondary | ICD-10-CM | POA: Diagnosis present

## 2019-05-08 DIAGNOSIS — I110 Hypertensive heart disease with heart failure: Secondary | ICD-10-CM | POA: Diagnosis present

## 2019-05-08 DIAGNOSIS — J9600 Acute respiratory failure, unspecified whether with hypoxia or hypercapnia: Secondary | ICD-10-CM | POA: Diagnosis not present

## 2019-05-08 DIAGNOSIS — D72829 Elevated white blood cell count, unspecified: Secondary | ICD-10-CM | POA: Diagnosis not present

## 2019-05-08 DIAGNOSIS — I7771 Dissection of carotid artery: Secondary | ICD-10-CM | POA: Diagnosis present

## 2019-05-08 DIAGNOSIS — I712 Thoracic aortic aneurysm, without rupture: Secondary | ICD-10-CM | POA: Diagnosis present

## 2019-05-08 DIAGNOSIS — Z950 Presence of cardiac pacemaker: Secondary | ICD-10-CM

## 2019-05-08 DIAGNOSIS — J95821 Acute postprocedural respiratory failure: Secondary | ICD-10-CM | POA: Diagnosis not present

## 2019-05-08 DIAGNOSIS — Z823 Family history of stroke: Secondary | ICD-10-CM

## 2019-05-08 DIAGNOSIS — G464 Cerebellar stroke syndrome: Secondary | ICD-10-CM

## 2019-05-08 DIAGNOSIS — E8809 Other disorders of plasma-protein metabolism, not elsewhere classified: Secondary | ICD-10-CM | POA: Diagnosis not present

## 2019-05-08 DIAGNOSIS — I63412 Cerebral infarction due to embolism of left middle cerebral artery: Secondary | ICD-10-CM | POA: Diagnosis present

## 2019-05-08 DIAGNOSIS — Z66 Do not resuscitate: Secondary | ICD-10-CM | POA: Diagnosis present

## 2019-05-08 DIAGNOSIS — F17210 Nicotine dependence, cigarettes, uncomplicated: Secondary | ICD-10-CM | POA: Diagnosis present

## 2019-05-08 DIAGNOSIS — Z880 Allergy status to penicillin: Secondary | ICD-10-CM

## 2019-05-08 DIAGNOSIS — I69391 Dysphagia following cerebral infarction: Secondary | ICD-10-CM

## 2019-05-08 DIAGNOSIS — I69322 Dysarthria following cerebral infarction: Secondary | ICD-10-CM

## 2019-05-08 DIAGNOSIS — I6523 Occlusion and stenosis of bilateral carotid arteries: Secondary | ICD-10-CM | POA: Diagnosis present

## 2019-05-08 DIAGNOSIS — R7401 Elevation of levels of liver transaminase levels: Secondary | ICD-10-CM | POA: Diagnosis not present

## 2019-05-08 DIAGNOSIS — I10 Essential (primary) hypertension: Secondary | ICD-10-CM | POA: Diagnosis present

## 2019-05-08 DIAGNOSIS — Z5309 Procedure and treatment not carried out because of other contraindication: Secondary | ICD-10-CM | POA: Diagnosis not present

## 2019-05-08 DIAGNOSIS — I63322 Cerebral infarction due to thrombosis of left anterior cerebral artery: Secondary | ICD-10-CM

## 2019-05-08 DIAGNOSIS — E876 Hypokalemia: Secondary | ICD-10-CM | POA: Diagnosis not present

## 2019-05-08 DIAGNOSIS — E1142 Type 2 diabetes mellitus with diabetic polyneuropathy: Secondary | ICD-10-CM | POA: Diagnosis present

## 2019-05-08 DIAGNOSIS — T17908A Unspecified foreign body in respiratory tract, part unspecified causing other injury, initial encounter: Secondary | ICD-10-CM

## 2019-05-08 DIAGNOSIS — Z20822 Contact with and (suspected) exposure to covid-19: Secondary | ICD-10-CM | POA: Diagnosis present

## 2019-05-08 DIAGNOSIS — D62 Acute posthemorrhagic anemia: Secondary | ICD-10-CM | POA: Diagnosis not present

## 2019-05-08 DIAGNOSIS — Z7289 Other problems related to lifestyle: Secondary | ICD-10-CM

## 2019-05-08 DIAGNOSIS — R569 Unspecified convulsions: Secondary | ICD-10-CM | POA: Diagnosis present

## 2019-05-08 DIAGNOSIS — I361 Nonrheumatic tricuspid (valve) insufficiency: Secondary | ICD-10-CM | POA: Diagnosis not present

## 2019-05-08 DIAGNOSIS — I5042 Chronic combined systolic (congestive) and diastolic (congestive) heart failure: Secondary | ICD-10-CM | POA: Diagnosis not present

## 2019-05-08 DIAGNOSIS — R195 Other fecal abnormalities: Secondary | ICD-10-CM | POA: Diagnosis not present

## 2019-05-08 DIAGNOSIS — I6522 Occlusion and stenosis of left carotid artery: Secondary | ICD-10-CM

## 2019-05-08 DIAGNOSIS — R739 Hyperglycemia, unspecified: Secondary | ICD-10-CM | POA: Diagnosis not present

## 2019-05-08 DIAGNOSIS — G8191 Hemiplegia, unspecified affecting right dominant side: Secondary | ICD-10-CM | POA: Diagnosis present

## 2019-05-08 DIAGNOSIS — E119 Type 2 diabetes mellitus without complications: Secondary | ICD-10-CM

## 2019-05-08 DIAGNOSIS — F419 Anxiety disorder, unspecified: Secondary | ICD-10-CM | POA: Diagnosis not present

## 2019-05-08 DIAGNOSIS — J449 Chronic obstructive pulmonary disease, unspecified: Secondary | ICD-10-CM | POA: Diagnosis present

## 2019-05-08 DIAGNOSIS — G931 Anoxic brain damage, not elsewhere classified: Secondary | ICD-10-CM | POA: Diagnosis present

## 2019-05-08 DIAGNOSIS — G4733 Obstructive sleep apnea (adult) (pediatric): Secondary | ICD-10-CM | POA: Diagnosis not present

## 2019-05-08 DIAGNOSIS — R1310 Dysphagia, unspecified: Secondary | ICD-10-CM | POA: Diagnosis present

## 2019-05-08 DIAGNOSIS — Z885 Allergy status to narcotic agent status: Secondary | ICD-10-CM

## 2019-05-08 DIAGNOSIS — I340 Nonrheumatic mitral (valve) insufficiency: Secondary | ICD-10-CM | POA: Diagnosis not present

## 2019-05-08 DIAGNOSIS — E1165 Type 2 diabetes mellitus with hyperglycemia: Secondary | ICD-10-CM | POA: Diagnosis present

## 2019-05-08 DIAGNOSIS — I69320 Aphasia following cerebral infarction: Secondary | ICD-10-CM | POA: Diagnosis not present

## 2019-05-08 DIAGNOSIS — I480 Paroxysmal atrial fibrillation: Secondary | ICD-10-CM | POA: Diagnosis present

## 2019-05-08 DIAGNOSIS — Z8701 Personal history of pneumonia (recurrent): Secondary | ICD-10-CM

## 2019-05-08 DIAGNOSIS — Z7901 Long term (current) use of anticoagulants: Secondary | ICD-10-CM

## 2019-05-08 DIAGNOSIS — Z888 Allergy status to other drugs, medicaments and biological substances status: Secondary | ICD-10-CM

## 2019-05-08 DIAGNOSIS — I670 Dissection of cerebral arteries, nonruptured: Secondary | ICD-10-CM | POA: Diagnosis present

## 2019-05-08 DIAGNOSIS — T82838A Hemorrhage of vascular prosthetic devices, implants and grafts, initial encounter: Secondary | ICD-10-CM

## 2019-05-08 DIAGNOSIS — R7309 Other abnormal glucose: Secondary | ICD-10-CM | POA: Diagnosis not present

## 2019-05-08 DIAGNOSIS — I6782 Cerebral ischemia: Secondary | ICD-10-CM | POA: Diagnosis present

## 2019-05-08 DIAGNOSIS — I69351 Hemiplegia and hemiparesis following cerebral infarction affecting right dominant side: Secondary | ICD-10-CM | POA: Diagnosis not present

## 2019-05-08 DIAGNOSIS — Z951 Presence of aortocoronary bypass graft: Secondary | ICD-10-CM

## 2019-05-08 DIAGNOSIS — Z7982 Long term (current) use of aspirin: Secondary | ICD-10-CM

## 2019-05-08 DIAGNOSIS — E669 Obesity, unspecified: Secondary | ICD-10-CM | POA: Diagnosis present

## 2019-05-08 DIAGNOSIS — I63422 Cerebral infarction due to embolism of left anterior cerebral artery: Secondary | ICD-10-CM | POA: Diagnosis present

## 2019-05-08 DIAGNOSIS — R791 Abnormal coagulation profile: Secondary | ICD-10-CM | POA: Diagnosis present

## 2019-05-08 DIAGNOSIS — R29715 NIHSS score 15: Secondary | ICD-10-CM | POA: Diagnosis present

## 2019-05-08 DIAGNOSIS — R4701 Aphasia: Secondary | ICD-10-CM | POA: Diagnosis present

## 2019-05-08 DIAGNOSIS — T82858A Stenosis of vascular prosthetic devices, implants and grafts, initial encounter: Secondary | ICD-10-CM | POA: Diagnosis present

## 2019-05-08 DIAGNOSIS — I959 Hypotension, unspecified: Secondary | ICD-10-CM | POA: Diagnosis not present

## 2019-05-08 DIAGNOSIS — I482 Chronic atrial fibrillation, unspecified: Secondary | ICD-10-CM

## 2019-05-08 DIAGNOSIS — E039 Hypothyroidism, unspecified: Secondary | ICD-10-CM | POA: Diagnosis present

## 2019-05-08 DIAGNOSIS — Z833 Family history of diabetes mellitus: Secondary | ICD-10-CM

## 2019-05-08 DIAGNOSIS — I5043 Acute on chronic combined systolic (congestive) and diastolic (congestive) heart failure: Secondary | ICD-10-CM | POA: Diagnosis not present

## 2019-05-08 DIAGNOSIS — K219 Gastro-esophageal reflux disease without esophagitis: Secondary | ICD-10-CM | POA: Diagnosis present

## 2019-05-08 DIAGNOSIS — E869 Volume depletion, unspecified: Secondary | ICD-10-CM | POA: Diagnosis not present

## 2019-05-08 HISTORY — DX: Migraine, unspecified, not intractable, without status migrainosus: G43.909

## 2019-05-08 HISTORY — PX: IR US GUIDE VASC ACCESS RIGHT: IMG2390

## 2019-05-08 HISTORY — PX: RADIOLOGY WITH ANESTHESIA: SHX6223

## 2019-05-08 HISTORY — PX: IR CT HEAD LTD: IMG2386

## 2019-05-08 HISTORY — PX: IR PERCUTANEOUS ART THROMBECTOMY/INFUSION INTRACRANIAL INC DIAG ANGIO: IMG6087

## 2019-05-08 LAB — DIFFERENTIAL
Abs Immature Granulocytes: 0.27 10*3/uL — ABNORMAL HIGH (ref 0.00–0.07)
Basophils Absolute: 0 10*3/uL (ref 0.0–0.1)
Basophils Relative: 0 %
Eosinophils Absolute: 0.4 10*3/uL (ref 0.0–0.5)
Eosinophils Relative: 2 %
Immature Granulocytes: 2 %
Lymphocytes Relative: 6 %
Lymphs Abs: 1 10*3/uL (ref 0.7–4.0)
Monocytes Absolute: 0.8 10*3/uL (ref 0.1–1.0)
Monocytes Relative: 5 %
Neutro Abs: 14.2 10*3/uL — ABNORMAL HIGH (ref 1.7–7.7)
Neutrophils Relative %: 85 %

## 2019-05-08 LAB — CBC
HCT: 45.1 % (ref 39.0–52.0)
Hemoglobin: 14.7 g/dL (ref 13.0–17.0)
MCH: 29.1 pg (ref 26.0–34.0)
MCHC: 32.6 g/dL (ref 30.0–36.0)
MCV: 89.1 fL (ref 80.0–100.0)
Platelets: 357 10*3/uL (ref 150–400)
RBC: 5.06 MIL/uL (ref 4.22–5.81)
RDW: 15.3 % (ref 11.5–15.5)
WBC: 16.6 10*3/uL — ABNORMAL HIGH (ref 4.0–10.5)
nRBC: 0 % (ref 0.0–0.2)

## 2019-05-08 LAB — PROTIME-INR
INR: 1.4 — ABNORMAL HIGH (ref 0.8–1.2)
Prothrombin Time: 17.4 seconds — ABNORMAL HIGH (ref 11.4–15.2)

## 2019-05-08 LAB — I-STAT CHEM 8, ED
BUN: 18 mg/dL (ref 6–20)
Calcium, Ion: 1.11 mmol/L — ABNORMAL LOW (ref 1.15–1.40)
Chloride: 102 mmol/L (ref 98–111)
Creatinine, Ser: 0.9 mg/dL (ref 0.61–1.24)
Glucose, Bld: 192 mg/dL — ABNORMAL HIGH (ref 70–99)
HCT: 46 % (ref 39.0–52.0)
Hemoglobin: 15.6 g/dL (ref 13.0–17.0)
Potassium: 4.1 mmol/L (ref 3.5–5.1)
Sodium: 136 mmol/L (ref 135–145)
TCO2: 23 mmol/L (ref 22–32)

## 2019-05-08 LAB — COMPREHENSIVE METABOLIC PANEL
ALT: 81 U/L — ABNORMAL HIGH (ref 0–44)
AST: 47 U/L — ABNORMAL HIGH (ref 15–41)
Albumin: 3.1 g/dL — ABNORMAL LOW (ref 3.5–5.0)
Alkaline Phosphatase: 123 U/L (ref 38–126)
Anion gap: 11 (ref 5–15)
BUN: 19 mg/dL (ref 6–20)
CO2: 23 mmol/L (ref 22–32)
Calcium: 8.5 mg/dL — ABNORMAL LOW (ref 8.9–10.3)
Chloride: 102 mmol/L (ref 98–111)
Creatinine, Ser: 1.02 mg/dL (ref 0.61–1.24)
GFR calc Af Amer: >60 mL/min (ref >60)
GFR calc non Af Amer: >60 mL/min (ref >60)
Glucose, Bld: 202 mg/dL — ABNORMAL HIGH (ref 70–99)
Potassium: 4.3 mmol/L (ref 3.5–5.1)
Sodium: 136 mmol/L (ref 135–145)
Total Bilirubin: 1.1 mg/dL (ref 0.3–1.2)
Total Protein: 6.1 g/dL — ABNORMAL LOW (ref 6.5–8.1)

## 2019-05-08 LAB — TRIGLYCERIDES: Triglycerides: 156 mg/dL — ABNORMAL HIGH (ref <150)

## 2019-05-08 LAB — CBG MONITORING, ED: Glucose-Capillary: 176 mg/dL — ABNORMAL HIGH (ref 70–99)

## 2019-05-08 LAB — POCT I-STAT 7, (LYTES, BLD GAS, ICA,H+H)
Acid-base deficit: 1 mmol/L (ref 0.0–2.0)
Bicarbonate: 25.7 mmol/L (ref 20.0–28.0)
Calcium, Ion: 1.13 mmol/L — ABNORMAL LOW (ref 1.15–1.40)
HCT: 37 % — ABNORMAL LOW (ref 39.0–52.0)
Hemoglobin: 12.6 g/dL — ABNORMAL LOW (ref 13.0–17.0)
O2 Saturation: 98 %
Patient temperature: 97.2
Potassium: 4.5 mmol/L (ref 3.5–5.1)
Sodium: 136 mmol/L (ref 135–145)
TCO2: 27 mmol/L (ref 22–32)
pCO2 arterial: 46.7 mmHg (ref 32.0–48.0)
pH, Arterial: 7.345 — ABNORMAL LOW (ref 7.350–7.450)
pO2, Arterial: 103 mmHg (ref 83.0–108.0)

## 2019-05-08 LAB — GLUCOSE, CAPILLARY
Glucose-Capillary: 156 mg/dL — ABNORMAL HIGH (ref 70–99)
Glucose-Capillary: 183 mg/dL — ABNORMAL HIGH (ref 70–99)

## 2019-05-08 LAB — APTT: aPTT: 25 seconds (ref 24–36)

## 2019-05-08 LAB — RESPIRATORY PANEL BY RT PCR (FLU A&B, COVID)
Influenza A by PCR: NEGATIVE
Influenza B by PCR: NEGATIVE
SARS Coronavirus 2 by RT PCR: NEGATIVE

## 2019-05-08 LAB — MRSA PCR SCREENING: MRSA by PCR: NEGATIVE

## 2019-05-08 LAB — STREP PNEUMONIAE URINARY ANTIGEN: Strep Pneumo Urinary Antigen: NEGATIVE

## 2019-05-08 LAB — PROCALCITONIN: Procalcitonin: 0.11 ng/mL

## 2019-05-08 LAB — TROPONIN I (HIGH SENSITIVITY): Troponin I (High Sensitivity): 240 ng/L (ref <18)

## 2019-05-08 LAB — HIV ANTIBODY (ROUTINE TESTING W REFLEX): HIV Screen 4th Generation wRfx: NONREACTIVE

## 2019-05-08 SURGERY — IR WITH ANESTHESIA
Anesthesia: General

## 2019-05-08 MED ORDER — CHLORHEXIDINE GLUCONATE CLOTH 2 % EX PADS
6.0000 | MEDICATED_PAD | Freq: Every day | CUTANEOUS | Status: DC
  Administered 2019-05-09 – 2019-05-13 (×5): 6 via TOPICAL

## 2019-05-08 MED ORDER — ROCURONIUM BROMIDE 50 MG/5ML IV SOSY
PREFILLED_SYRINGE | INTRAVENOUS | Status: DC | PRN
  Administered 2019-05-08: 60 mg via INTRAVENOUS
  Administered 2019-05-08: 100 mg via INTRAVENOUS

## 2019-05-08 MED ORDER — VANCOMYCIN HCL IN DEXTROSE 1-5 GM/200ML-% IV SOLN
1000.0000 mg | INTRAVENOUS | Status: AC
  Administered 2019-05-08: 1000 mg via INTRAVENOUS
  Filled 2019-05-08: qty 200

## 2019-05-08 MED ORDER — PHENYLEPHRINE 40 MCG/ML (10ML) SYRINGE FOR IV PUSH (FOR BLOOD PRESSURE SUPPORT)
PREFILLED_SYRINGE | INTRAVENOUS | Status: DC | PRN
  Administered 2019-05-08: 120 ug via INTRAVENOUS
  Administered 2019-05-08 (×2): 80 ug via INTRAVENOUS
  Administered 2019-05-08: 120 ug via INTRAVENOUS

## 2019-05-08 MED ORDER — CHLORHEXIDINE GLUCONATE 0.12% ORAL RINSE (MEDLINE KIT)
15.0000 mL | Freq: Two times a day (BID) | OROMUCOSAL | Status: DC
  Administered 2019-05-08: 15 mL via OROMUCOSAL

## 2019-05-08 MED ORDER — ASPIRIN 81 MG PO CHEW: 324.0000 mg | CHEWABLE_TABLET | Freq: Every day | ORAL | Status: DC

## 2019-05-08 MED ORDER — NITROGLYCERIN 1 MG/10 ML FOR IR/CATH LAB
10.0000 mL | INTRA_ARTERIAL | Status: AC
  Administered 2019-05-08: 75 ug via INTRA_ARTERIAL
  Filled 2019-05-08 (×2): qty 10

## 2019-05-08 MED ORDER — ASPIRIN 81 MG PO CHEW
81.0000 mg | CHEWABLE_TABLET | Freq: Every day | ORAL | Status: DC
  Filled 2019-05-08: qty 1

## 2019-05-08 MED ORDER — FENTANYL CITRATE (PF) 100 MCG/2ML IJ SOLN
25.0000 ug | INTRAMUSCULAR | Status: DC | PRN
  Administered 2019-05-08: 100 ug via INTRAVENOUS
  Administered 2019-05-08 (×2): 50 ug via INTRAVENOUS
  Filled 2019-05-08 (×2): qty 2

## 2019-05-08 MED ORDER — PROPOFOL 10 MG/ML IV BOLUS
INTRAVENOUS | Status: DC | PRN
  Administered 2019-05-08: 150 mg via INTRAVENOUS

## 2019-05-08 MED ORDER — ASPIRIN 325 MG PO TABS
ORAL_TABLET | ORAL | Status: AC
  Filled 2019-05-08: qty 2

## 2019-05-08 MED ORDER — TICAGRELOR 90 MG PO TABS
ORAL_TABLET | ORAL | Status: AC
  Filled 2019-05-08: qty 2

## 2019-05-08 MED ORDER — SODIUM CHLORIDE 0.9% FLUSH
3.0000 mL | Freq: Once | INTRAVENOUS | Status: AC
  Administered 2019-05-08: 3 mL via INTRAVENOUS

## 2019-05-08 MED ORDER — IOHEXOL 350 MG/ML SOLN
100.0000 mL | Freq: Once | INTRAVENOUS | Status: AC | PRN
  Administered 2019-05-08: 100 mL via INTRAVENOUS

## 2019-05-08 MED ORDER — CLEVIDIPINE BUTYRATE 0.5 MG/ML IV EMUL: 0.0000 mg/h | INTRAVENOUS | Status: DC

## 2019-05-08 MED ORDER — OXYMETAZOLINE HCL 0.05 % NA SOLN
1.0000 | Freq: Two times a day (BID) | NASAL | Status: DC
  Administered 2019-05-09 – 2019-05-13 (×7): 1 via NASAL
  Filled 2019-05-08 (×2): qty 30

## 2019-05-08 MED ORDER — PHENYLEPHRINE HCL-NACL 10-0.9 MG/250ML-% IV SOLN
INTRAVENOUS | Status: DC | PRN
  Administered 2019-05-08: 20 ug/min via INTRAVENOUS

## 2019-05-08 MED ORDER — IOHEXOL 300 MG/ML  SOLN
150.0000 mL | Freq: Once | INTRAMUSCULAR | Status: AC | PRN
  Administered 2019-05-08: 10 mL via INTRA_ARTERIAL

## 2019-05-08 MED ORDER — ACETAMINOPHEN 650 MG RE SUPP: 650.0000 mg | RECTAL | Status: DC | PRN

## 2019-05-08 MED ORDER — NOREPINEPHRINE 4 MG/250ML-% IV SOLN
0.0000 ug/min | INTRAVENOUS | Status: DC
  Administered 2019-05-09: 10 ug/min via INTRAVENOUS
  Administered 2019-05-09: 12 ug/min via INTRAVENOUS
  Administered 2019-05-10: 18:00:00 4 ug/min via INTRAVENOUS
  Administered 2019-05-10: 5 ug/min via INTRAVENOUS
  Filled 2019-05-08 (×7): qty 250

## 2019-05-08 MED ORDER — SODIUM CHLORIDE 0.9 % IV SOLN: INTRAVENOUS | Status: DC

## 2019-05-08 MED ORDER — EPTIFIBATIDE 20 MG/10ML IV SOLN
INTRAVENOUS | Status: AC
  Filled 2019-05-08: qty 10

## 2019-05-08 MED ORDER — EPHEDRINE SULFATE-NACL 50-0.9 MG/10ML-% IV SOSY
PREFILLED_SYRINGE | INTRAVENOUS | Status: DC | PRN
  Administered 2019-05-08 (×2): 15 mg via INTRAVENOUS
  Administered 2019-05-08 (×4): 10 mg via INTRAVENOUS

## 2019-05-08 MED ORDER — IOHEXOL 300 MG/ML  SOLN
150.0000 mL | Freq: Once | INTRAMUSCULAR | Status: AC | PRN
  Administered 2019-05-08: 75 mL via INTRA_ARTERIAL

## 2019-05-08 MED ORDER — ASPIRIN 325 MG PO TABS
325.0000 mg | ORAL_TABLET | Freq: Every day | ORAL | Status: DC
  Administered 2019-05-10: 325 mg via ORAL
  Filled 2019-05-08: qty 1

## 2019-05-08 MED ORDER — NOREPINEPHRINE 4 MG/250ML-% IV SOLN
INTRAVENOUS | Status: DC | PRN
  Administered 2019-05-08: 4 ug/min via INTRAVENOUS

## 2019-05-08 MED ORDER — DEXMEDETOMIDINE HCL IN NACL 200 MCG/50ML IV SOLN
INTRAVENOUS | Status: AC
  Filled 2019-05-08: qty 50

## 2019-05-08 MED ORDER — FENTANYL CITRATE (PF) 100 MCG/2ML IJ SOLN
INTRAMUSCULAR | Status: AC
  Filled 2019-05-08: qty 2

## 2019-05-08 MED ORDER — TIROFIBAN HCL IN NACL 5-0.9 MG/100ML-% IV SOLN
INTRAVENOUS | Status: AC
  Filled 2019-05-08: qty 100

## 2019-05-08 MED ORDER — HEPARIN (PORCINE) 25000 UT/250ML-% IV SOLN
1500.0000 [IU]/h | INTRAVENOUS | Status: DC
  Administered 2019-05-08: 1000 [IU]/h via INTRAVENOUS
  Administered 2019-05-09: 1350 [IU]/h via INTRAVENOUS
  Administered 2019-05-10: 1450 [IU]/h via INTRAVENOUS
  Administered 2019-05-11 – 2019-05-12 (×2): 1400 [IU]/h via INTRAVENOUS
  Filled 2019-05-08 (×7): qty 250

## 2019-05-08 MED ORDER — IOHEXOL 350 MG/ML SOLN
50.0000 mL | Freq: Once | INTRAVENOUS | Status: AC | PRN
  Administered 2019-05-08: 50 mL via INTRAVENOUS

## 2019-05-08 MED ORDER — CLOPIDOGREL BISULFATE 300 MG PO TABS
ORAL_TABLET | ORAL | Status: AC
  Filled 2019-05-08: qty 1

## 2019-05-08 MED ORDER — DEXMEDETOMIDINE HCL IN NACL 400 MCG/100ML IV SOLN: 0.4000 ug/kg/h | INTRAVENOUS | Status: DC

## 2019-05-08 MED ORDER — DEXMEDETOMIDINE HCL IN NACL 200 MCG/50ML IV SOLN: INTRAVENOUS | Status: DC | PRN

## 2019-05-08 MED ORDER — ACETAMINOPHEN 160 MG/5ML PO SOLN: 650.0000 mg | ORAL | Status: DC | PRN

## 2019-05-08 MED ORDER — STROKE: EARLY STAGES OF RECOVERY BOOK
Freq: Once | Status: AC
  Filled 2019-05-08: qty 1

## 2019-05-08 MED ORDER — ONDANSETRON HCL 4 MG/2ML IJ SOLN
INTRAMUSCULAR | Status: DC | PRN
  Administered 2019-05-08: 4 mg via INTRAVENOUS

## 2019-05-08 MED ORDER — LACTATED RINGERS IV BOLUS
500.0000 mL | Freq: Once | INTRAVENOUS | Status: AC
  Administered 2019-05-08: 500 mL via INTRAVENOUS

## 2019-05-08 MED ORDER — DEXMEDETOMIDINE HCL IN NACL 200 MCG/50ML IV SOLN
INTRAVENOUS | Status: DC | PRN
  Administered 2019-05-08: .7 ug/kg/h via INTRAVENOUS

## 2019-05-08 MED ORDER — ASPIRIN 325 MG PO TABS
650.0000 mg | ORAL_TABLET | Freq: Once | ORAL | Status: AC
  Administered 2019-05-08: 650 mg via ORAL

## 2019-05-08 MED ORDER — DEXTROSE IN LACTATED RINGERS 5 % IV SOLN: INTRAVENOUS | Status: DC

## 2019-05-08 MED ORDER — PROPOFOL 1000 MG/100ML IV EMUL
5.0000 ug/kg/min | INTRAVENOUS | Status: DC
  Administered 2019-05-08: 5 ug/kg/min via INTRAVENOUS
  Filled 2019-05-08: qty 100

## 2019-05-08 MED ORDER — SENNOSIDES-DOCUSATE SODIUM 8.6-50 MG PO TABS: 1.0000 | ORAL_TABLET | Freq: Every evening | ORAL | Status: DC | PRN

## 2019-05-08 MED ORDER — ACETAMINOPHEN 325 MG PO TABS: 650.0000 mg | ORAL_TABLET | ORAL | Status: DC | PRN

## 2019-05-08 MED ORDER — SUGAMMADEX SODIUM 200 MG/2ML IV SOLN
INTRAVENOUS | Status: DC | PRN
  Administered 2019-05-08 (×2): 100 mg via INTRAVENOUS

## 2019-05-08 MED ORDER — SODIUM CHLORIDE 0.9 % IV SOLN: INTRAVENOUS | Status: DC | PRN

## 2019-05-08 MED ORDER — INSULIN ASPART 100 UNIT/ML ~~LOC~~ SOLN
2.0000 [IU] | SUBCUTANEOUS | Status: DC
  Administered 2019-05-08 (×2): 4 [IU] via SUBCUTANEOUS
  Administered 2019-05-09 – 2019-05-11 (×5): 2 [IU] via SUBCUTANEOUS
  Administered 2019-05-11: 4 [IU] via SUBCUTANEOUS
  Administered 2019-05-13 (×4): 2 [IU] via SUBCUTANEOUS

## 2019-05-08 MED ORDER — ASPIRIN 81 MG PO CHEW
CHEWABLE_TABLET | ORAL | Status: AC
  Filled 2019-05-08: qty 1

## 2019-05-08 MED ORDER — LIDOCAINE 2% (20 MG/ML) 5 ML SYRINGE
INTRAMUSCULAR | Status: DC | PRN
  Administered 2019-05-08: 60 mg via INTRAVENOUS

## 2019-05-08 MED ORDER — MIDAZOLAM HCL 2 MG/2ML IJ SOLN
1.0000 mg | INTRAMUSCULAR | Status: DC | PRN
  Administered 2019-05-09: 2 mg via INTRAVENOUS
  Filled 2019-05-08: qty 2

## 2019-05-08 MED ORDER — FENTANYL CITRATE (PF) 100 MCG/2ML IJ SOLN
INTRAMUSCULAR | Status: DC | PRN
  Administered 2019-05-08: 100 ug via INTRAVENOUS

## 2019-05-08 MED ORDER — ORAL CARE MOUTH RINSE
15.0000 mL | OROMUCOSAL | Status: DC
  Administered 2019-05-08 – 2019-05-09 (×6): 15 mL via OROMUCOSAL

## 2019-05-08 NOTE — ED Provider Notes (Signed)
[facility] EMERGENCY DEPARTMENT Provider Note   CSN: [id number] Arrival date & time: 05/08/19  1037     History Code stroke  Jon [name] is a 54 y.o. male.  HPI   Patient has a complex medical history as indicated below.  Patient is currently on Coumadin.  Patient was transported to the ED for evaluation of possible acute stroke.  Code stroke was activated in the field.  Per the EMS report, patient woke up normally this morning.  Family witnessed sudden onset of of weakness and inability to speak.  Patient was noted to have right-sided hemiparesis, gaze deviation, and aphasia.  Patient was seen on arrival by Dr Wilford Corner and the stroke team.  Past Medical History:  Diagnosis Date  . Aneurysm of thoracic aorta (HCC) 05/06/2012   IMO routine update IMO routine update  . Anxiety    sees Dr. Renae Fickle  . Aortic aneurysm and dissection (HCC) 05/06/2012  . Aortic stenosis    mechanical AVR 04/22/12  . Aortic valve disorder 05/06/2012  . 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 Bicuspid aortic valve   . Cerebral infarction (HCC) 05/11/2012  . Cerebral thrombosis with cerebral infarction (HCC) 07/14/2013  . Cholelithiasis 12/20/2016  . Current use of long term anticoagulation 12/20/2016  . CVA (cerebral infarction)    occipital CVA 12/13  . Dissecting aortic aneurysm (HCC) 05/06/2012  . Dysarthria 09/10/2017  . Dysrhythmia    sees Dr. Geronimo Running, [facility] cardiology in [place]  . Generalized constriction of visual field 10/15/2012   IMOUPDATE IMOUPDATE  . 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 GERD (gastroesophageal reflux disease)   . HCAP (healthcare-associated pneumonia) 05/06/2012  . Headache(784.0)    "due to vision problem"  . History of CVA (cerebrovascular accident) 05/07/2012  . Hypothyroidism   . Idiopathic peripheral neuropathy 07/14/2013  . Late effects of CVA (cerebrovascular accident) 08/08/2015  .  Nontraumatic multiple localized intracerebral hemorrhages (HCC) 10/16/2017  . Paroxysmal atrial fibrillation (HCC) 08/08/2015   On warfarin On warfarin  . Presence of prosthetic heart valve 08/08/2015  . Shortness of breath   . Stroke (HCC)    x3  . Stroke, embolic (HCC) 05/07/2012  . Syncope 05/06/2012  . Thoracic aortic aneurysm (HCC)    replacement aortic root 04/22/12  . VBI (vertebrobasilar insufficiency) 09/10/2017    Patient Active Problem List   Diagnosis Date Noted  . Status post aortic valve replacement 02/23/2019  . Pacemaker Muse Jude device implanted in summer 2020 02/23/2019  . Erectile dysfunction 12/10/2017  . Nontraumatic multiple localized intracerebral hemorrhages (HCC) 10/16/2017  . Dysarthria 09/10/2017  . VBI (vertebrobasilar insufficiency) 09/10/2017  . Cholelithiasis 12/20/2016  . Current use of long term anticoagulation 12/20/2016  . Late effects of CVA (cerebrovascular accident) 08/08/2015  . Paroxysmal atrial fibrillation (HCC) 08/08/2015  . Presence of prosthetic heart valve 08/08/2015  . 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 Idiopathic peripheral neuropathy 07/14/2013  . Cerebral thrombosis with cerebral infarction (HCC) 07/14/2013  . Generalized constriction of visual field 10/15/2012  . Seroma, postoperative 06/13/2012  . CVA (cerebral infarction) 05/11/2012  . Cerebral infarction (HCC) 05/11/2012  . Stroke, embolic (HCC) 05/07/2012    Class: Acute  . History of CVA (cerebrovascular accident) 05/07/2012  . HCAP (healthcare-associated pneumonia) 05/06/2012  . Syncope 05/06/2012  . Anxiety 05/06/2012  . Aortic stenosis 05/06/2012  . Aortic aneurysm and dissection (HCC) 05/06/2012  .  Thoracic aortic aneurysm ([place]) 05/06/2012  . Aortic valve disorder 05/06/2012  . Dissecting aortic aneurysm ([place]) 05/06/2012  . Aneurysm of thoracic aorta ([place]) 05/06/2012    Past  Surgical History:  Procedure Laterality Date  . APPLICATION OF WOUND VAC  06/11/2012   Procedure: APPLICATION OF WOUND VAC;  Surgeon: Melrose Nakayama, MD;  Location: [place];  Service: Vascular;  Laterality: Right;  . CARDIAC CATHETERIZATION    . CHOLECYSTECTOMY    . CORONARY ARTERY BYPASS GRAFT  04/22/2012   Procedure: CORONARY ARTERY BYPASS GRAFTING (CABG);  Surgeon: Melrose Nakayama, MD;  Location: [place];  Service: Open Heart Surgery;  Laterality: N/A;  times one with right greater saphenous vein graft  . I & D EXTREMITY  06/11/2012   Procedure: IRRIGATION AND DEBRIDEMENT EXTREMITY;  Surgeon: Melrose Nakayama, MD;  Location: [place];  Service: Vascular;  Laterality: Right;  . INTRAOPERATIVE TRANSESOPHAGEAL ECHOCARDIOGRAM  04/22/2012   Procedure: INTRAOPERATIVE TRANSESOPHAGEAL ECHOCARDIOGRAM;  Surgeon: Melrose Nakayama, MD;  Location: [place];  Service: Open Heart Surgery;  Laterality: N/A;  . ROTATOR CUFF REPAIR     12 years ago, Left  . THORACIC AORTIC ANEURYSM REPAIR  04/22/2012   Procedure: THORACIC ASCENDING ANEURYSM REPAIR (AAA);  Surgeon: Melrose Nakayama, MD;  Location: [place];  Service: Open Heart Surgery;  Laterality: N/A;       Family History  Problem Relation Age of Onset  . Lung disease Mother   . Diabetes Father   . Peripheral Artery Disease Father   . Hypertension Father   . CAD Father   . Heart disease Father   . Stroke Father   . Cancer Maternal Grandfather   . Heart disease Paternal Grandfather     Social History   Tobacco Use  . Smoking status: Current Every Day Smoker    Packs/day: 2.50    Types: Cigarettes    Start date: 04/21/2012    Last attempt to quit: 04/21/2012    Years since quitting: 7.0  . Smokeless tobacco: Never Used  Substance Use Topics  . Alcohol use: Yes  . Drug use: No    Comment: previous crack cocaine "quit 4 years ago"    Home Medications Prior to Admission medications   Medication Sig Start Date End Date Taking?  Authorizing Provider  acetaminophen (TYLENOL) 500 MG tablet Take by mouth.    [provider]  amiodarone (PACERONE) 200 MG tablet Take 1 tablet (200 mg total) by mouth 3 (three) times daily. 02/25/19   Park Liter, MD  aspirin 81 MG chewable tablet Chew 1 tablet (81 mg total) by mouth daily. 05/15/12   Angiulli, Lavon Paganini, PA-C  buPROPion (WELLBUTRIN SR) 150 MG 12 hr tablet Take 1 tablet by mouth 2 (two) times daily.    [provider]  busPIRone (BUSPAR) 15 MG tablet Take 15 mg by mouth 2 (two) times daily.     [provider]  cyclobenzaprine (FLEXERIL) 10 MG tablet TK 1 T PO TID 10/25/17   [provider]  DULoxetine (CYMBALTA) 60 MG capsule Take by mouth. 01/20/16   [provider]  furosemide (LASIX) 20 MG tablet Take 20 mg by mouth 2 (two) times daily. Takes 2 in am and 1 at lunch    [provider]  gabapentin (NEURONTIN) 300 MG capsule Take 300 mg by mouth 2 (two) times daily. Takes 1 in am and 2 in pm    [provider]  ibuprofen (ADVIL,MOTRIN) 600 MG  tablet Take 600 mg by mouth every 8 (eight) hours. 05/19/18   [provider]  levothyroxine (SYNTHROID, LEVOTHROID) 50 MCG tablet Take 50 mcg by mouth daily before breakfast.    [provider]  montelukast (SINGULAIR) 10 MG tablet Take 10 mg by mouth at bedtime.    [provider]  omeprazole (PRILOSEC) 20 MG capsule Take by mouth. 08/17/16   [provider]  potassium chloride (K-DUR) 10 MEQ tablet Take by mouth daily. 09/08/15   [provider]  PROAIR HFA 108 (90 Base) MCG/ACT inhaler INHALE 2 PUFFS BY MOUTH EVERY 4 TO 6 HOURS AS NEEDED 11/05/17   [provider]  rosuvastatin (CRESTOR) 40 MG tablet Take 40 mg by mouth daily. 11/16/17   [provider]  topiramate (TOPAMAX) 50 MG tablet TAKE 1 TABLET BY MOUTH IN THE MORNING FOR 7 DAYS THEN TAKE 1 TABLET BY MOUTH TWICE DAILY 11/08/17   [provider]    warfarin (COUMADIN) 5 MG tablet TAKE 1 & 1/2 TO 2 TABLETS BY MOUTH DAILY AS DIRECTED BY COUMADIN CLINIC 03/30/19   Camnitz, Andree Coss, MD    Allergies    Hydrocodone-acetaminophen, Hydromorphone, Other, Oxycodone, Penicillins, and Dilaudid [hydromorphone hcl]  Review of Systems   Review of Systems  Unable to perform ROS: Patient nonverbal    Physical Exam Updated Vital Signs Wt 106 kg   BMI 32.59 kg/m   Physical Exam Vitals and nursing note reviewed.  Constitutional:      Appearance: He is well-developed. He is ill-appearing.  HENT:     Head: Normocephalic and atraumatic.     Right Ear: External ear normal.     Left Ear: External ear normal.  Eyes:     General: No scleral icterus.       Right eye: No discharge.        Left eye: No discharge.     Conjunctiva/sclera: Conjunctivae normal.  Neck:     Trachea: No tracheal deviation.  Cardiovascular:     Rate and Rhythm: Normal rate.  Pulmonary:     Effort: Pulmonary effort is normal. No respiratory distress.     Breath sounds: No stridor.  Abdominal:     General: There is no distension.  Musculoskeletal:        General: No swelling or deformity.     Cervical back: Neck supple.  Skin:    General: Skin is warm and dry.     Findings: No rash.  Neurological:     Mental Status: He is alert.     Cranial Nerves: Cranial nerve deficit: aphasic, gaze deviation.     Comments: Right sided hemiparesis, movement noted left hand and foot     ED Results / Procedures / Treatments   Labs (all labs ordered are listed, but only abnormal results are displayed) Labs Reviewed  PROTIME-INR - Abnormal; Notable for the following components:      Result Value   Prothrombin Time 17.4 (*)    INR 1.4 (*)    All other components within normal limits  CBC - Abnormal; Notable for the following components:   WBC 16.6 (*)    All other components within normal limits  DIFFERENTIAL - Abnormal; Notable for the following components:    Neutro Abs 14.2 (*)    Abs Immature Granulocytes 0.27 (*)    All other components within normal limits  COMPREHENSIVE METABOLIC PANEL - Abnormal; Notable for the following components:   Glucose, Bld 202 (*)  Calcium 8.5 (*)    Total Protein 6.1 (*)    Albumin 3.1 (*)    AST 47 (*)    ALT 81 (*)    All other components within normal limits  I-STAT CHEM 8, ED - Abnormal; Notable for the following components:   Glucose, Bld 192 (*)    Calcium, Ion 1.11 (*)    All other components within normal limits  CBG MONITORING, ED - Abnormal; Notable for the following components:   Glucose-Capillary 176 (*)    All other components within normal limits  APTT    EKG EKG Interpretation  Date/Time:  Friday May 08 2019 11:31:26 EST Ventricular Rate:  62 PR Interval:    QRS Duration: 211 QT Interval:  556 QTC Calculation: 565 R Axis:   -85 Text Interpretation: Sinus rhythm Prolonged PR interval Nonspecific IVCD with LAD LVH with secondary repolarization abnormality No significant change since last tracing Confirmed by Linwood Dibbles [id number]) on 05/08/2019 11:36:33 AM   Radiology CT HEAD CODE STROKE WO CONTRAST  Result Date: 05/08/2019 CLINICAL DATA:  Code stroke. Neuro deficit, acute, stroke suspected. EXAM: CT HEAD WITHOUT CONTRAST TECHNIQUE: Contiguous axial images were obtained from the base of the skull through the vertex without intravenous contrast. COMPARISON:  Head CT 10/26/2018 FINDINGS: Brain: Abnormal hypodensity within the body of the corpus callosum eccentric to the left also possibly involving portions left pericallosal gyrus, measuring 2.1 x 1.1 cm in transaxial dimensions (series 3, image 19) (series 6, image 35). No evidence of acute intracranial hemorrhage. No demarcated cortical infarction. No evidence of intracranial mass. No midline shift or extra-axial fluid collection. Cerebral volume is normal for age. Vascular: No hyperdense vessel. Skull: Normal. Negative for fracture or  focal lesion. Sinuses/Orbits: Visualized orbits demonstrate no acute abnormality. These results were called by telephone at the time of interpretation on 05/08/2019 at 11:01 am to provider Dr. Wilford Corner, who verbally acknowledged these results. IMPRESSION: 2.1 x 1.1 cm focus of abnormal hypodensity within the left aspect of the callosal body and possibly also involving the pericallosal gyrus on the left. Findings are new from prior examination 10/26/2018 and may reflect an acute infarct or other lesion. Consider brain MRI for further evaluation. Electronically Signed   By: Jackey Loge DO   On: 05/08/2019 11:01    Procedures Procedures (including critical care time)  Medications Ordered in ED Medications  sodium chloride flush (NS) 0.9 % injection 3 mL (has no administration in time range)    ED Course  I have reviewed the triage vital signs and the nursing notes.  Pertinent labs & imaging results that were available during my care of the patient were reviewed by me and considered in my medical decision making (see chart for details).    MDM Rules/Calculators/A&P                      Patient presented as possible code stroke.  Patient has significant right-sided deficits concerning for large vessel occlusion.  Preliminary CT does not show any evidence of hemorrhage.  Pt will be admitted by stroke team, decision to be made regarding TPA vs IR intervention. Final Clinical Impression(s) / ED Diagnoses Final diagnoses:  Cerebral infarction, unspecified mechanism (HCC)     Linwood Dibbles, MD 05/08/19 [phone]

## 2019-05-08 NOTE — Progress Notes (Signed)
eLink Physician-Brief Progress Note Patient Name: Jon [name] DOB: [birth year] MRN: [id number]   Date of Service  05/08/2019  HPI/Events of Note  Troponin = 240 - EKG reveals Sinus rhythm. Prolonged PR interval. Nonspecific IVCD with LAD. LVH with secondary repolarization abnormality.  eICU Interventions  Will order: 1. ASA 81 mg per tube now and Q day. 2. Continue to trend Troponin.      Intervention Category Intermediate Interventions: Diagnostic test evaluation  Lenell Antu 05/08/2019, 9:55 PM

## 2019-05-08 NOTE — Anesthesia Postprocedure Evaluation (Signed)
Anesthesia Post Note  Patient: Jon [name]  Procedure(s) Performed: IR WITH ANESTHESIA (N/A )     Patient location during evaluation: ICU Anesthesia Type: General Level of consciousness: sedated and patient remains intubated per anesthesia plan Pain management: pain level controlled Vital Signs Assessment: post-procedure vital signs reviewed and stable Respiratory status: patient remains intubated per anesthesia plan Cardiovascular status: stable Postop Assessment: no apparent nausea or vomiting Anesthetic complications: no    Last Vitals:  Vitals:   05/08/19 1315 05/08/19 1630  BP: (!) 84/68   Pulse: 61   Resp: 17   Temp:    SpO2: 97% 99%    Last Pain:  Vitals:   05/08/19 1211  TempSrc:   PainSc: 0-No pain                 Beryle Lathe

## 2019-05-08 NOTE — Progress Notes (Signed)
Post procedure evaluation note Discussed with Dr. Loreta Ave After it was initially revascularized but then occluded again on subsequent run.   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 No stents placed Loaded with aspirin  He is sedated and intubated at this time. Due to his recent pneumonia and difficulty getting him off the ventilator, he will remain intubated for now. 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 Stroke team will continue to follow  Additional 20 min of CC time.  -- Milon Dikes, MD [facility] Pager: [phone] If 7pm to 7am, please call on call as listed on AMION.

## 2019-05-08 NOTE — Sedation Documentation (Signed)
Anethesia caring for patient

## 2019-05-08 NOTE — Anesthesia Procedure Notes (Signed)
Arterial Line Insertion Start/End1/05/2019 1:59 PM Performed by: Colon Flattery, CRNA, CRNA  Preanesthetic checklist: patient identified, IV checked, site marked, risks and benefits discussed, surgical consent, monitors and equipment checked, pre-op evaluation, timeout performed and anesthesia consent Emergency situation Patient sedated Left, radial was placed Catheter size: 20 G Hand hygiene performed  and maximum sterile barriers used   Attempts: 1 Procedure performed without using ultrasound guided technique. Following insertion, dressing applied and Biopatch. Post procedure assessment: normal  Patient tolerated the procedure well with no immediate complications.

## 2019-05-08 NOTE — Consult Note (Signed)
NAME:  Jon Cedaroney Ray Vessels, MRN:  [id number], DOB:  07-26-1965, LOS: 0 ADMISSION DATE:  05/08/2019, CONSULTATION DATE:  05/08/2019  REFERRING MD:  Dr Wilford CornerArora of neuro, CHIEF COMPLAINT:  Stroke on vent. Recent admit at [facility] for pneumonia   Brief History   See below.  History gained from review of the chart, talking to the neurologist  History of present illness   Jon [name] -54 year old male with complex medical history past medical history of prior stroke with residual dysarthria, type a aortic dissection by Dr. Edwina BarthStephen Hendrickson in 2014 with repair that was complicated and a status post brachiocephalic graft to the left carotid, aortic stenosis status post mechanical heart valve on anticoagulation. Also has PAF  Patient has been using a walker to ambulate since her hospitalization a month ago.  Also on 2 L nasal cannula at baseline for unclear reasons.  Was admitted to [facility] sometime few to several days ago for right-sided weakness was was treated for pneumonia.  And since that admission has been having trouble using the right leg and walking.  Is around May 01, 2019.  He was discharged yesterday on therapeutic Lovenox with last dose May 07, 2019.  His most recent discharge date from [facility] was May 07, 2019.  Then on May 07, 2018 1 in the morning at 9 AM he had sudden onset of aphasia and right-sided weakness with slumping.  It was noted that his Coumadin was subtherapeutic but he was on Lovenox.  [facility] emergency room with NIHSS stroke score of 13.  CT work-up showed innominate graft occlusion which is new from CT chest April 30, 2019 and also CTA with left anterior cerebral artery occlusion A1 region with a small infarct.  Patient was intubated for re- vascularization and status post thrombectomy that was successful versus nonsuccessful [unclear from notes].  Current blood pressure goal is systolic blood pressure 140-180.  Neurology  feels this anticoagulation because of his heart valve needs to be restarted.  He was intubated for the procedure.  He is being brought back post procedure intubated.  Pulmonary has been called for management of medical issues and postop respiratory failure.  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 (07/14/2013), Bicuspid aortic valve, Cerebral infarction (HCC) (05/11/2012), Cerebral thrombosis with cerebral infarction (HCC) (07/14/2013), Cholelithiasis (12/20/2016), Current use of long term anticoagulation (12/20/2016), CVA (cerebral infarction), Dissecting aortic aneurysm (HCC) (05/06/2012), Dysarthria (09/10/2017), Dysrhythmia, Generalized constriction of visual field (10/15/2012), 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), GERD (gastroesophageal reflux disease), HCAP (healthcare-associated pneumonia) (05/06/2012), Headache(784.0), History of CVA (cerebrovascular accident) (05/07/2012), Hypothyroidism, Idiopathic peripheral neuropathy (07/14/2013), Late effects of CVA (cerebrovascular accident) (08/08/2015), Nontraumatic multiple localized intracerebral hemorrhages (HCC) (10/16/2017), Paroxysmal atrial fibrillation (HCC) (08/08/2015), Presence of prosthetic heart valve (08/08/2015), Shortness of breath, Stroke [facility](HCC), Stroke, embolic (HCC) (05/07/2012), Syncope (05/06/2012), Thoracic aortic aneurysm (HCC), and VBI (vertebrobasilar insufficiency) (09/10/2017).   reports that he has been smoking cigarettes. He started smoking about 7 years ago. He has been smoking about 2.50 packs per day. He has never used smokeless tobacco.  Past Surgical History:  Procedure Laterality Date  . APPLICATION OF WOUND VAC  06/11/2012   Procedure: APPLICATION OF WOUND VAC;  Surgeon: Loreli SlotSteven C Hendrickson, MD;   [facility];  Service: Vascular;  Laterality: Right;  .  CARDIAC CATHETERIZATION    . CHOLECYSTECTOMY    . CORONARY ARTERY BYPASS GRAFT  04/22/2012   Procedure: CORONARY ARTERY BYPASS GRAFTING (CABG);  Surgeon: Loreli SlotSteven C Hendrickson, MD;  [facility];  Service: Open Heart Surgery;  Laterality: N/A;  times one with right greater saphenous vein graft  . I & D EXTREMITY  06/11/2012   Procedure: IRRIGATION AND DEBRIDEMENT EXTREMITY;  Surgeon: Loreli SlotSteven C Hendrickson, MD;  [facility];  Service: Vascular;  Laterality: Right;  . INTRAOPERATIVE TRANSESOPHAGEAL ECHOCARDIOGRAM  04/22/2012   Procedure: INTRAOPERATIVE TRANSESOPHAGEAL ECHOCARDIOGRAM;  Surgeon: Loreli SlotSteven C Hendrickson, MD;  [facility];  Service: Open Heart Surgery;  Laterality: N/A;  . ROTATOR CUFF REPAIR     12 years ago, Left  . THORACIC AORTIC ANEURYSM REPAIR  04/22/2012   Procedure: THORACIC ASCENDING ANEURYSM REPAIR (AAA);  Surgeon: Loreli SlotSteven C Hendrickson, MD;  [facility];  Service: Open Heart Surgery;  Laterality: N/A;    Allergies  Allergen Reactions  . Hydrocodone-Acetaminophen Other (See Comments)    PT MOTHER DOES NOT REMEMBER REACTION  Only use in Emergency   . Hydromorphone Itching and Rash       . Other Other (See Comments)    ALL NARCOTICS-ITCHING, RASH   . Oxycodone Other (See Comments)    Unknown reaction  . Penicillins Rash    Did it involve swelling of the face/tongue/throat, SOB, or low BP? Unknown Did it involve sudden or severe rash/hives, skin peeling, or any reaction on the inside of your mouth or nose? Unknown Did you need to seek medical attention at a hospital or doctor's office? Unknown When did it last happen?unknown If all above answers are "NO", may proceed with cephalosporin use.  . Dilaudid [Hydromorphone Hcl] Itching and Rash     There is no immunization history on file for this patient.  Family History  Problem Relation Age of Onset  . Lung disease Mother   . Diabetes  Father   . Peripheral Artery Disease Father   . Hypertension Father   . CAD Father   . Heart disease Father   . Stroke Father   . Cancer Maternal Grandfather   . Heart disease Paternal Grandfather      Current Facility-Administered Medications:  .   stroke: mapping our early stages of recovery book, , Does not apply, Once, Milon DikesArora, Ashish, MD .  0.9 %  sodium chloride infusion, , Intravenous, Continuous, Milon DikesArora, Ashish, MD .  acetaminophen (TYLENOL) tablet 650 mg, 650 mg, Oral, Q4H PRN **OR** acetaminophen (TYLENOL) 160 MG/5ML solution 650 mg, 650 mg, Per Tube, Q4H PRN **OR** acetaminophen (TYLENOL) suppository 650 mg, 650 mg, Rectal, Q4H PRN, Milon DikesArora, Ashish, MD .  fentaNYL (SUBLIMAZE) 100 MCG/2ML injection, , , ,  .  oxymetazoline (AFRIN) 0.05 % nasal spray 1 spray, 1 spray, Each Nare, BID, Gilmer MorWagner, Jaime, DO .  senna-docusate (Senokot-S) tablet 1 tablet, 1 tablet, Oral, QHS PRN, Milon DikesArora, Ashish, MD   Significant Hospital Events   05/08/2019 - admit  Consults:  05/08/2019 - ccm January is 2021-interventional radiology May 08, 2019-vascular service  Procedures:  05/08/2019 - ETT  Significant Diagnostic Tests:  *x  Micro Data:  Tracheal aspirate May 08, 2019 Covid  Antimicrobials:  x  Interim history/subjective:  Seen in bed for not 23  Objective   Blood pressure (!) 84/68, pulse 61, temperature (!) 97.3 F (36.3 C), temperature source Tympanic, resp. rate 17, height 5\' 11"  (1.803 m), weight 106 kg, SpO2 99 %.    Vent  Mode: PRVC FiO2 (%):  [50 %] 50 % Set Rate:  [14 bmp] 14 bmp Vt Set:  [600 mL] 600 mL PEEP:  [5 cmH20] 5 cmH20 Plateau Pressure:  [16 cmH20] 16 cmH20   Intake/Output Summary (Last 24 hours) at 05/08/2019 1659 Last data filed at 05/08/2019 1545 Gross per 24 hour  Intake 800 ml  Output 350 ml  Net 450 ml   Filed Weights   05/08/19 1000  Weight: 106 kg    Examination: General: Well-built male on the bed with ventilator connected to him HENT: ET tube  present Lungs: Clear to auscultation bilaterally Cardiovascular: Currently hypotensive.  Normal heart sounds.  Nurses starting Levophed Abdomen: Soft nontender no organomegaly Extremities: No cyanosis no clubbing no edema Neuro: Sedated GU: External genitalia looks normal  Resolved Hospital Problem list   x  Assessment & Plan:   ASSESSMENT / PLAN:  PULMONARY A:  Acute respiratory failure of following stroke    P:   Full mechanical ventilator support Vent bundle    A:   Left-sided anterior cerebral artery circulation stroke status post thrombectomy and revascularization May 08, 2019 P:   Per neurology     A:   History of aortic dissection type a in 2014 with complex repair Dr. Dorris Fetch   Goal blood pressure between 140-180 systolic blood pressure.  Currently hypotensive -probably due to Precedex/volume depletion  P:  Fluid bolu Levophed for systolic blood pressure 140-180 but his blood pressure is high then to Cleviprex s     A: ?  History of coronary artery disease Status post aortic prostatic valve on anticoagulation  P: Echo going to be obtained by neurology Anticoagulation going to be started by neurology Cycle cardiac enzymes   A: History of proximal atrial fibrillation   P: EKG Anticoagulation Neurology will consider cardiology consult   A:   History of pneumonia not otherwise specified discharge [facility] 07, 2019 [facility] P:   Check urine strep, urine Legionella Check respiratory virus panel Check respiratory tracheal aspirate for bacterial cultures   A:  At risk for AKI but currently creatinine normal P:  Hydrate   A:  At risk for electrolyte imbalance P: Keep magnesium greater than 2 and potassium greater than 4    A:   Mild transaminitis  P:   N.p.o. PPI   A:  Hemoglobin normal but at risk for anemia of critical illness   P:  - PRBC for hgb </= 6.9gm%    - exceptions are   -  if ACS  susepcted/confirmed then transfuse for hgb </= 8.0gm%,  or    -  active bleeding with hemodynamic instability, then transfuse regardless of hemoglobin value   At at all times try to transfuse 1 unit prbc as possible with exception of active hemorrhage      A:   At risk for hypoglycemia P:   SSI  MSK/DERM At risk for sacral decub  Plan monitor    Best practice:  Diet: N.p.o. Pain/Anxiety/Delirium protocol (if indicated): Dipper Zenaida Niece if needed with fentanyl and Versed as needed VAP protocol (if indicated): Yes DVT prophylaxis: IV systemic anticoagulation because of PAF and mechanical heart valve GI prophylaxis: PPI Glucose control: SSI Mobility: Bedrest Code Status: Full code Family Communication: By primary service neurology Disposition [phone] ICU bed      ATTESTATION & SIGNATURE   The patient Jon [name] is critically ill with multiple organ systems failure and requires high complexity decision making for assessment and  support, frequent evaluation and titration of therapies, application of advanced monitoring technologies and extensive interpretation of multiple databases.   Critical Care Time devoted to patient care services described in this note is  60  Minutes. This time reflects time of care of this signee Dr Kalman Shan. This critical care time does not reflect procedure time, or teaching time or supervisory time of PA/NP/Med student/Med Resident etc but could involve care discussion time     Dr. Kalman Shan, M.D., [facility].C.P Pulmonary and Critical Care Medicine Staff Physician [facility] Pager: [phone], If no answer or between  15:00h - 7:00h: call [phone]  05/08/2019 4:59 PM     LABS    PULMONARY Recent Labs  Lab 05/08/19 1047  TCO2 23    CBC Recent Labs  Lab 05/08/19 1041 05/08/19 1047  HGB 14.7 15.6  HCT 45.1 46.0  WBC 16.6*  --   PLT 357  --      COAGULATION Recent Labs  Lab 05/08/19 1041  INR 1.4*    CARDIAC  No results for input(s): TROPONINI in the last 168 hours. No results for input(s): PROBNP in the last 168 hours.   CHEMISTRY Recent Labs  Lab 05/08/19 1041 05/08/19 1047  NA 136 136  K 4.3 4.1  CL 102 102  CO2 23  --   GLUCOSE 202* 192*  BUN 19 18  CREATININE 1.02 0.90  CALCIUM 8.5*  --    Estimated Creatinine Clearance: 117.6 mL/min (by C-G formula based on SCr of 0.9 mg/dL).   LIVER Recent Labs  Lab 05/08/19 1041  AST 47*  ALT 81*  ALKPHOS 123  BILITOT 1.1  PROT 6.1*  ALBUMIN 3.1*  INR 1.4*     INFECTIOUS No results for input(s): LATICACIDVEN, PROCALCITON in the last 168 hours.   ENDOCRINE CBG (last 3)  Recent Labs    05/08/19 1041  GLUCAP 176*         IMAGING x48h  - image(s) personally visualized  -   highlighted in bold CT Code Stroke CTA Head W/WO contrast  Addendum Date: 05/08/2019   ADDENDUM REPORT: 05/08/2019 11:48 ADDENDUM: Upon further review, there is likely greater stenosis within the proximal left common carotid artery than originally stated. This is on the basis of circumferential plaque or thrombus. Exact quantification of stenosis is difficult due to vessel tortuosity, but likely moderate/severe. Addended findings called by telephone at the time of interpretation on 05/08/2019 at 11:47 am to provider [facility] , who verbally acknowledged these results. Electronically Signed   By: Jackey Loge DO   On: 05/08/2019 11:48   Result Date: 05/08/2019 CLINICAL DATA:  Stroke, follow-up. EXAM: CT ANGIOGRAPHY HEAD AND NECK CT PERFUSION BRAIN TECHNIQUE: Multidetector CT imaging of the head and neck was performed using the standard protocol during bolus administration of intravenous contrast. Multiplanar CT image reconstructions and MIPs were obtained to evaluate the vascular anatomy. Carotid stenosis measurements (when applicable) are obtained utilizing NASCET criteria, using  the distal internal carotid diameter as the denominator. CONTRAST:  Administered contrast not known at this time. 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 The proximal right common carotid artery also appears partially thrombosed with  high-grade stenosis. Right carotid system: Clot within the proximal right common carotid artery with high-grade stenosis. The stenosis gradually diminishes within the distal common carotid artery. There is circumferential hypoattenuation within the distal common which may reflect plaque or eccentric thrombus. Eccentric plaque is also present within the carotid bulb without significant narrowing. The there is abrupt caliber change towards the cephalad aspect of the carotid bulb with the cervical ICA significantly asymmetrically diminutive as compared to the right throughout the remainder of the neck and through the siphon region with sites of high-grade stenosis within the siphon region. Left carotid system: The common carotid artery is patent without significant stenosis (50% or greater). Eccentric plaque or thrombus within the proximal common carotid artery. The ICA is patent without significant stenosis. Mild mixed plaque within the left carotid bulb. Vertebral arteries: Left vertebral artery slightly dominant. The bilateral vertebral arteries are patent throughout the neck without significant stenosis. Skeleton: No acute bony abnormality. Other neck: No soft tissue neck mass or cervical lymphadenopathy. Upper chest: Partially imaged extensive airspace opacities within the left lung apex suspicious for pneumonia. Review of the MIP images confirms the above findings CTA HEAD  FINDINGS Anterior circulation: Markedly diminutive intracranial right ICA through the siphon region with sites of high-grade stenosis. Mixed plaque also results in mild-to-moderate narrowing of the supraclinoid right ICA. The M1 right middle cerebral artery is patent without significant proximal stenosis. Atherosclerotic irregularity of M2 right MCA branches with no proximal branch occlusion identified. The right anterior cerebral artery is patent without proximal branch occlusion or high-grade proximal stenosis. The M1 left middle cerebral artery is patent without significant stenosis. Atherosclerotic irregularity of M2 left MCA branch vessels without proximal branch occlusion identified. Abrupt occlusion of the proximal A2 left anterior cerebral artery. There appears to be some reconstitution of the distal left ACA. Posterior circulation: The intracranial vertebral arteries are patent without significant stenosis, as is the basilar artery. The bilateral posterior cerebral arteries are patent without proximal branch occlusion or high-grade proximal arterial stenosis. Posterior communicating arteries are hypoplastic or absent bilaterally. Venous sinuses: Within limitations of contrast timing, no convincing thrombus. Anatomic variants: As described Review of the MIP images confirms the above findings These results were called by telephone at the time of interpretation on 05/08/2019 at 11:15 am to provider [facility] , who verbally acknowledged these results. IMPRESSION: IMPRESSION CTA neck: 1. 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 2. 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 Thrombus extends into the proximal right common carotid artery with high-grade stenosis. There is a progressive decrease in this stenosis towards the distal CCA. There is abrupt caliber change of the cervical right ICA just distal to the  bulb. New from prior examinations, the cervical ICA is markedly asymmetrically diminutive throughout the neck and through the siphon region. Sites of high-grade stenosis within the siphon region. 3. The left common and cervical internal carotid arteries are patent without significant stenosis. Circumferential plaque or thrombus within the proximal left common carotid artery. 4. Bilateral vertebral arteries patent within the neck without significant stenosis. 5. Extensive airspace disease within the partially imaged left lung apex suspicious for pneumonia. CTA head: 1. The right ICA is markedly diminutive through the siphon region with sites of high-grade stenosis. Mixed plaque also results in mild/moderate stenosis of the supraclinoid right ICA. 2. Abrupt occlusion of the proximal A2 left anterior cerebral artery. There appears to be some  reconstitution of the left ACA distally. 3. No other intracranial large vessel occlusion or proximal high-grade arterial stenosis is identified. Electronically Signed: By: Jackey Loge DO On: 05/08/2019 11:39   CT Code Stroke CTA Neck W/WO contrast  Addendum Date: 05/08/2019   ADDENDUM REPORT: 05/08/2019 11:48 ADDENDUM: Upon further review, there is likely greater stenosis within the proximal left common carotid artery than originally stated. This is on the basis of circumferential plaque or thrombus. Exact quantification of stenosis is difficult due to vessel tortuosity, but likely moderate/severe. Addended findings called by telephone at the time of interpretation on 05/08/2019 at 11:47 am to provider [facility] , who verbally acknowledged these results. Electronically Signed   By: Jackey Loge DO   On: 05/08/2019 11:48   Result Date: 05/08/2019 CLINICAL DATA:  Stroke, follow-up. EXAM: CT ANGIOGRAPHY HEAD AND NECK CT PERFUSION BRAIN TECHNIQUE: Multidetector CT imaging of the head and neck was performed using the standard protocol during bolus administration of intravenous  contrast. Multiplanar CT image reconstructions and MIPs were obtained to evaluate the vascular anatomy. Carotid stenosis measurements (when applicable) are obtained utilizing NASCET criteria, using the distal internal carotid diameter as the denominator. CONTRAST:  Administered contrast not known at this time. 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 The proximal right common carotid artery also appears partially thrombosed with high-grade stenosis. Right carotid system: Clot within the proximal right common carotid artery with high-grade stenosis. The stenosis gradually diminishes within the distal common carotid artery. There is circumferential hypoattenuation within the distal common which may reflect plaque or eccentric thrombus. Eccentric plaque is also present within the carotid bulb without significant narrowing. The there is abrupt caliber change towards the cephalad aspect of the carotid bulb with the cervical ICA significantly asymmetrically diminutive as compared to the right throughout the remainder of the neck and through the siphon region with sites of high-grade stenosis within the siphon region. Left carotid system: The common carotid artery is patent without significant stenosis (50% or greater). Eccentric plaque or thrombus within the proximal common carotid artery. The ICA is patent without significant stenosis. Mild mixed plaque within the left carotid bulb. Vertebral arteries: Left vertebral artery slightly dominant. The bilateral vertebral arteries are patent throughout the neck without significant stenosis. Skeleton: No acute bony abnormality. Other neck: No soft tissue neck mass or cervical  lymphadenopathy. Upper chest: Partially imaged extensive airspace opacities within the left lung apex suspicious for pneumonia. Review of the MIP images confirms the above findings CTA HEAD FINDINGS Anterior circulation: Markedly diminutive intracranial right ICA through the siphon region with sites of high-grade stenosis. Mixed plaque also results in mild-to-moderate narrowing of the supraclinoid right ICA. The M1 right middle cerebral artery is patent without significant proximal stenosis. Atherosclerotic irregularity of M2 right MCA branches with no proximal branch occlusion identified. The right anterior cerebral artery is patent without proximal branch occlusion or high-grade proximal stenosis. The M1 left middle cerebral artery is patent without significant stenosis. Atherosclerotic irregularity of M2 left MCA branch vessels without proximal branch occlusion identified. Abrupt occlusion of the proximal A2 left anterior cerebral artery. There appears to be some reconstitution of the distal left ACA. Posterior circulation: The intracranial vertebral arteries are patent without significant stenosis, as is  the basilar artery. The bilateral posterior cerebral arteries are patent without proximal branch occlusion or high-grade proximal arterial stenosis. Posterior communicating arteries are hypoplastic or absent bilaterally. Venous sinuses: Within limitations of contrast timing, no convincing thrombus. Anatomic variants: As described Review of the MIP images confirms the above findings These results were called by telephone at the time of interpretation on 05/08/2019 at 11:15 am to provider [facility] , who verbally acknowledged these results. IMPRESSION: IMPRESSION CTA neck: 1. 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 2. 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 Thrombus extends into the proximal right common  carotid artery with high-grade stenosis. There is a progressive decrease in this stenosis towards the distal CCA. There is abrupt caliber change of the cervical right ICA just distal to the bulb. New from prior examinations, the cervical ICA is markedly asymmetrically diminutive throughout the neck and through the siphon region. Sites of high-grade stenosis within the siphon region. 3. The left common and cervical internal carotid arteries are patent without significant stenosis. Circumferential plaque or thrombus within the proximal left common carotid artery. 4. Bilateral vertebral arteries patent within the neck without significant stenosis. 5. Extensive airspace disease within the partially imaged left lung apex suspicious for pneumonia. CTA head: 1. The right ICA is markedly diminutive through the siphon region with sites of high-grade stenosis. Mixed plaque also results in mild/moderate stenosis of the supraclinoid right ICA. 2. Abrupt occlusion of the proximal A2 left anterior cerebral artery. There appears to be some reconstitution of the left ACA distally. 3. No other intracranial large vessel occlusion or proximal high-grade arterial stenosis is identified. Electronically Signed: By: Jackey Loge DO On: 05/08/2019 11:39   CT Code Stroke Cerebral Perfusion with contrast  Result Date: 05/08/2019 CLINICAL DATA:  Stroke, follow-up. EXAM: CT PERFUSION BRAIN TECHNIQUE: Multiphase CT imaging of the brain was performed following IV bolus contrast injection. Subsequent parametric perfusion maps were calculated using RAPID software. CONTRAST:  OMNIPAQUE IOHEXOL 350 MG/ML SOLN, 60mL OMNIPAQUE IOHEXOL 350 MG/ML SOLN COMPARISON:  Same day noncontrast head CT and CT angiogram head/neck FINDINGS: CT Brain Perfusion Findings: CBF (<30%) Volume: 71mL Perfusion (Tmax>6.0s) volume: 125 mL (predominantly involving the left ACA and ACA/MCA watershed territory, as well as the right ACA/MCA watershed territory). Mismatch  Volume: 125 mL Infarct Core: None identified by the perfusion software. Infarction Location: None identified by the perfusion software. These results were called by telephone at the time of interpretation on 05/08/2019 at 11:15 a.m. pm to provider [facility] , who verbally acknowledged these results. IMPRESSION: 125 mL region of hypoperfused parenchyma predominantly within the left ACA and ACA/MCA watershed territory, as well as right ACA/MCA watershed territory. The perfusion software does not detect any core infarct. However, there were clear changes of infarction within the corpus callosum and left pericallosal gyrus on noncontrast head CT performed earlier the same day. Electronically Signed   By: Jackey Loge DO   On: 05/08/2019 12:43   CT Angio Chest/Abd/Pel for Dissection W and/or W/WO  Result Date: 05/08/2019 CLINICAL DATA:  Possible dissection. EXAM: CT ANGIOGRAPHY CHEST, ABDOMEN AND PELVIS TECHNIQUE: Multidetector CT imaging through the chest, abdomen and pelvis was performed using the standard protocol during bolus administration of intravenous contrast. Multiplanar reconstructed images and MIPs were obtained and reviewed to evaluate the vascular anatomy. CONTRAST:  56mL OMNIPAQUE IOHEXOL 350 MG/ML SOLN COMPARISON:  July 08, 2012.  April 30, 2019. FINDINGS: CTA CHEST FINDINGS Cardiovascular: Status post surgical repair of ascending thoracic aortic aneurysm as well as aortic valve replacement. No aneurysm or dissection is noted currently. Left common carotid and subclavian arteries appear to be patent. 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 Mild cardiomegaly is noted. No pericardial effusion is noted. Left-sided pacemaker is unchanged in position. Mediastinum/Nodes: Thyroid gland and esophagus are unremarkable. Stable aortopulmonary window and right paratracheal adenopathy is noted  which most likely is reactive and benign. Lungs/Pleura: No pneumothorax or pleural effusion is noted. Diffuse left lung opacity is noted concerning for pneumonia or scarring. Mild emphysematous disease is noted. Mild right posterior basilar subsegmental atelectasis is noted. Musculoskeletal: No chest wall abnormality. No acute or significant osseous findings. Review of the MIP images confirms the above findings. CTA ABDOMEN AND PELVIS FINDINGS VASCULAR Aorta: There is no evidence of aortic dissection or aneurysm. Mild atheromatous disease is noted with probable focal soft plaque seen involving the infrarenal abdominal aorta. Celiac: Patent without evidence of aneurysm, dissection, vasculitis or significant stenosis. SMA: Patent without evidence of aneurysm, dissection, vasculitis or significant stenosis. Renals: Both renal arteries are patent without evidence of aneurysm, dissection, vasculitis, fibromuscular dysplasia or significant stenosis. IMA: Patent without evidence of aneurysm, dissection, vasculitis or significant stenosis. Inflow: Patent without evidence of aneurysm, dissection, vasculitis or significant stenosis. Veins: No obvious venous abnormality within the limitations of this arterial phase study. Review of the MIP images confirms the above findings. NON-VASCULAR Hepatobiliary: No focal liver abnormality is seen. Status post cholecystectomy. No biliary dilatation. Pancreas: Unremarkable. No pancreatic ductal dilatation or surrounding inflammatory changes. Spleen: Normal in size without focal abnormality. Adrenals/Urinary Tract: Adrenal glands are unremarkable. Kidneys are normal, without renal calculi, focal lesion, or hydronephrosis. Bladder is unremarkable. Stomach/Bowel: Stomach is within normal limits. Appendix appears normal. No evidence of bowel wall thickening, distention, or inflammatory changes. Lymphatic: No significant adenopathy is noted. Reproductive: Prostate is unremarkable. Other:  Bilateral fat containing inguinal hernias are noted, right greater than left. No ascites is noted. Musculoskeletal: No acute or significant osseous findings. Review of the MIP images confirms the above findings. IMPRESSION: Status post surgical repair of ascending thoracic aortic aneurysm as well as aortic valve repair. No definite evidence of acute dissection or aneurysm is seen involving the thoracic or abdominal aorta. 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 Stable diffuse lung opacity is noted concerning for pneumonia or scarring. Bilateral fat containing inguinal hernias are noted, right greater than left. Aortic Atherosclerosis (ICD10-I70.0) and Emphysema (ICD10-J43.9).1 Electronically Signed   By: Lupita Raider M.D.   On: 05/08/2019 12:50   CT HEAD CODE STROKE WO CONTRAST  Result Date: 05/08/2019 CLINICAL DATA:  Code stroke. Neuro deficit, acute, stroke suspected. EXAM: CT HEAD WITHOUT CONTRAST TECHNIQUE: Contiguous axial images were obtained from the base of the skull through the vertex without intravenous contrast. COMPARISON:  Head CT 10/26/2018 FINDINGS: Brain: Abnormal hypodensity within the body of the corpus callosum eccentric to the left also possibly involving portions left pericallosal gyrus, measuring 2.1 x 1.1 cm in transaxial dimensions (series 3, image 19) (series 6, image 35). No evidence of acute intracranial hemorrhage. No demarcated cortical infarction. No evidence of intracranial mass. No midline shift or extra-axial fluid collection. Cerebral volume is normal for age. Vascular: No hyperdense vessel. Skull:  Normal. Negative for fracture or focal lesion. Sinuses/Orbits: Visualized orbits demonstrate no acute abnormality. These results were called by telephone at the time of interpretation on 05/08/2019 at 11:01 am to provider Dr. Rory Percy, who verbally acknowledged these results. IMPRESSION: 2.1 x 1.1 cm focus of abnormal  hypodensity within the left aspect of the callosal body and possibly also involving the pericallosal gyrus on the left. Findings are new from prior examination 10/26/2018 and may reflect an acute infarct or other lesion. Consider brain MRI for further evaluation. Electronically Signed   By: Kellie Simmering DO   On: 05/08/2019 11:01

## 2019-05-08 NOTE — ED Notes (Signed)
hyden soley, daughter, [phone]- would like pt update.

## 2019-05-08 NOTE — ED Triage Notes (Signed)
Pt here via [facility] as code stroke. LSW 0900 this morning, went to the bathroom and his wife noticed his slump/fall to the side, aphasia, R sided weakness, and L sided gaze. Pt on coumadin and lovenox, did not take coumadin this morning.

## 2019-05-08 NOTE — Anesthesia Procedure Notes (Signed)
Procedure Name: Intubation Date/Time: 05/08/2019 1:43 PM Performed by: Elliot Dally, CRNA Pre-anesthesia Checklist: Patient identified, Emergency Drugs available, Suction available and Patient being monitored Patient Re-evaluated:Patient Re-evaluated prior to induction Oxygen Delivery Method: Circle System Utilized Preoxygenation: Pre-oxygenation with 100% oxygen Induction Type: IV induction and Rapid sequence Ventilation: Mask ventilation without difficulty Laryngoscope Size: Miller and 2 Grade View: Grade I Tube type: Oral Tube size: 7.5 mm Number of attempts: 1 Airway Equipment and Method: Stylet and Oral airway Placement Confirmation: ETT inserted through vocal cords under direct vision,  positive ETCO2 and breath sounds checked- equal and bilateral Secured at: 23 cm Tube secured with: Tape Dental Injury: Teeth and Oropharynx as per pre-operative assessment

## 2019-05-08 NOTE — Procedures (Addendum)
Neuro-Interventional Radiology  Post Cerebral Angiogram Procedure Note  History:   History: 54 yo male with acute onset of right sided symptoms this morning, presents to the ED from home.  DC yesterday from [facility].   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    CT work up today shows innominate graft occlusion (new from [facility] chest CT from 04/30/2019), and post-treatment changes of the left CCA origin.   Neck/head CTA shows left ACA occlusion.  Small infarction of the left ACA anterior territory.    Site of occlusion:   Left A2 Innominate artery   IV tPA administered?: no IA Medication:  no  Devices/treatment: 3 Passes with aspiration thrombectomy, penumbra catheter of left A2 1 pass of 4x40 solitaire of left A2, with local aspiration with JetD  Final mTICI Score & time: Left A2 was TICI 0 to start, TICI 0 at completion  Anesthesia    GETA  Procedure:  - US guided R CFA access - Cerebral Angiogram - Mechanical thrombectomy of ELVO involving left A2 artery - Deployment of Angioseal for hemostasis - Flat panel CT in NIR suite  Findings:  Left A2 occlusion, remained occluded at the conclusion  CT shows no SAH at conclusion.  Staining of the infarction of left ACA territory.  Non-flow limiting dissection at the distal left cervical ICA  Left ICA supplies the right hemisphere by patent Acomm  Complications: Non-flow limiting distal left cervical ICA  EBL: 100cc  Recommendations: - right hip straight overnight - Goal SBP 120-160  - Frequent NV checks - Repeat CT or MRI imaging   discretion of Neurology - NIR to follow - continue anti-platelets  Signed,  Yvone Neu. Loreta Ave, DO

## 2019-05-08 NOTE — Progress Notes (Signed)
ANTICOAGULATION CONSULT NOTE - Initial Consult  Pharmacy Consult for heparin Indication: stroke and mechanical valve  Allergies  Allergen Reactions  . Hydrocodone-Acetaminophen Other (See Comments)    PT MOTHER DOES NOT REMEMBER REACTION  Only use in Emergency   . Hydromorphone Itching and Rash       . Other Other (See Comments)    ALL NARCOTICS-ITCHING, RASH   . Oxycodone Other (See Comments)    Unknown reaction  . Penicillins Rash    Did it involve swelling of the face/tongue/throat, SOB, or low BP? Unknown Did it involve sudden or severe rash/hives, skin peeling, or any reaction on the inside of your mouth or nose? Unknown Did you need to seek medical attention at a hospital or doctor's office? Unknown When did it last happen?unknown If all above answers are "NO", may proceed with cephalosporin use.  . Dilaudid [Hydromorphone Hcl] Itching and Rash    Patient Measurements: Height: 5\' 11"  (180.3 cm) Weight: 233 lb 11 oz (106 kg) IBW/kg (Calculated) : 75.3 Heparin Dosing Weight: 97.7kg  Vital Signs: Temp: 97.3 F (36.3 C) (01/01 1112) Temp Source: Tympanic (01/01 1112) BP: 84/68 (01/01 1315) Pulse Rate: 61 (01/01 1315)  Labs: Recent Labs    05/08/19 1041 05/08/19 1047  HGB 14.7 15.6  HCT 45.1 46.0  PLT 357  --   APTT 25  --   LABPROT 17.4*  --   INR 1.4*  --   CREATININE 1.02 0.90    Estimated Creatinine Clearance: 117.6 mL/min (by C-G formula based on SCr of 0.9 mg/dL).   Medical History: Past Medical History:  Diagnosis Date  . Aneurysm of thoracic aorta ([place]) 05/06/2012   IMO routine update IMO routine update  . Anxiety    sees Dr. Wende Neighbors  . Aortic aneurysm and dissection ([place]) 05/06/2012  . Aortic stenosis    mechanical AVR 04/22/12  . Aortic valve disorder 05/06/2012  . 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 Bicuspid aortic valve   . Cerebral infarction ([place]) 05/11/2012  . Cerebral thrombosis with cerebral  infarction ([place]) 07/14/2013  . Cholelithiasis 12/20/2016  . Current use of long term anticoagulation 12/20/2016  . CVA (cerebral infarction)    occipital CVA 12/13  . Dissecting aortic aneurysm ([place] 05/06/2012  . Dysarthria 09/10/2017  . Dysrhythmia    sees Dr. Leslie Dales, [facility] cardiology in [place]  . Generalized constriction of visual field 10/15/2012   IMOUPDATE IMOUPDATE  . 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 GERD (gastroesophageal reflux disease)   . HCAP (healthcare-associated pneumonia) 05/06/2012  . Headache(784.0)    "due to vision problem"  . History of CVA (cerebrovascular accident) 05/07/2012  . Hypothyroidism   . Idiopathic peripheral neuropathy 07/14/2013  . Late effects of CVA (cerebrovascular accident) 08/08/2015  . Nontraumatic multiple localized intracerebral hemorrhages ([place]) 10/16/2017  . Paroxysmal atrial fibrillation ([place]) 08/08/2015   On warfarin On warfarin  . Presence of prosthetic heart valve 08/08/2015  . Shortness of breath   . Stroke ([place])    x3  . Stroke, embolic ([place]) 09/07/2704  . Syncope 05/06/2012  . Thoracic aortic aneurysm ([place])    replacement aortic root 04/22/12  . VBI (vertebrobasilar insufficiency) 09/10/2017    Medications:  Infusions:  . sodium chloride    . heparin      Assessment: 60 yom presented to the ED with stroke symptoms. S/p recent discharge from [facility]. He is supposed to be on warfarin and lovenox for history of a  mechanical valve. INR is subtherapeutic at 1.4 and last dose of lovenox was last night. Pt is now s/p IR to start IV heparin. CBC is WNL.   Goal of Therapy:  Heparin level 0.3-0.5 units/ml Monitor platelets by anticoagulation protocol: Yes   Plan:  Heparin gtt 1000 units/hr - NO BOLUS Check an 8 hr heparin level Daily heparin level and CBC  Skylinn Vialpando, Drake Leach 05/08/2019,5:13 PM

## 2019-05-08 NOTE — Transfer of Care (Signed)
Immediate Anesthesia Transfer of Care Note  Patient: Jon [name]  Procedure(s) Performed: IR WITH ANESTHESIA (N/A )  Patient Location: PACU and ICU  Anesthesia Type:General  Level of Consciousness: sedated and Patient remains intubated per anesthesia plan  Airway & Oxygen Therapy: Patient remains intubated per anesthesia plan and Patient placed on Ventilator (see vital sign flow sheet for setting)  Post-op Assessment: Report given to RN and Post -op Vital signs reviewed and stable  Post vital signs: Reviewed and stable  Last Vitals:  Vitals Value Taken Time  BP 98/83 05/08/19 1630  Temp    Pulse 72 05/08/19 1637  Resp 15 05/08/19 1637  SpO2 99 % 05/08/19 1637  Vitals shown include unvalidated device data.  Last Pain:  Vitals:   05/08/19 1211  TempSrc:   PainSc: 0-No pain         Complications: No apparent anesthesia complications

## 2019-05-08 NOTE — Progress Notes (Signed)
Pt has MRI scheduled for 05/09/2019 at midnight, however pt has a pacemaker.   Contacted MRI and postponed it; they will call the rep during day shift and will get the MRI then   Neuro notified

## 2019-05-08 NOTE — ED Notes (Signed)
ACTIVATED INTERVENTIONAL RADIOLOGY PER DR.ARORA

## 2019-05-08 NOTE — Progress Notes (Addendum)
CRITICAL VALUE ALERT  Critical Value:  Troponin 240  Date & Time Notied:  05/08/2019 2127  Provider Notified: Pola Corn RN Mark @ 2130  Orders Received/Actions taken: See new orders

## 2019-05-08 NOTE — Progress Notes (Signed)
NeuroInterventional Radiology  Pre-Procedure Note  History: 54 yo male with acute onset of right sided symptoms this morning, presents to the ED from home.  DC yesterday from [facility].   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    CT work up today shows innominate graft occlusion (new from [facility] chest CT from 04/30/2019), and post-treatment changes of the left CCA origin.   Neck/head CTA shows left ACA occlusion.  Small infarction of the left ACA anterior territory.   ASPECTS of the left MCA territory is 10/10.  ASPECTS of the right MCA territory is 10/10  Preliminary discussion with Dr. Wilford Corner confirms that the NIHSS is at least a 13.    Discussion with his granddaughter Marchelle Folks, confirms that the patient is basically independent at home, despite prior clinical stroke, mRS of 1.   CTP:   Perfusion shows no "core".  I think the perfusion must be interpreted carefully, given the known innominate occlusion, and slow filling of the right hemisphere.    Given the patient's symptoms, imaging findings, baseline function, I believe they are an appropriate candidate for mechanical thrombectomy of the left ACA occlusion.    At this time, we do not have any good info to support an attempt at mechanical /aspiration/surgical thrombectomy of the innominate artery occlusion.  We have spoken to CT surgery and vascular surgery, who also confirm that the only treatment of the occlusion would be endovascular, unless a median sternotomy was considered. At this time, given the right ASPECTS, exam, and CTA showing slow flow into the right ICA, this will be deferred.  Likely heparin treatment.   The risks and benefits of the procedure were discussed with the patient/patient's family, Amanda/granddaughter, with specific risks including: bleeding, infection, arterial  injury/dissection, contrast reaction, kidney injury, need for further procedure/surgery, neurologic deficit, 10-15% risk of intracranial hemorrhage, cardiopulmonary collapse, death. All questions were answered.  The patient/family would like to proceed with attempt at thrombectomy.   Plan for cerebral angiogram and attempt at mechanical thrombectomy.   Signed,   Yvone Neu. Loreta Ave, DO

## 2019-05-08 NOTE — Consult Note (Signed)
ASSESSMENT & PLAN   LEFT BRAIN STROKE: The patient appears to have an embolic stroke on the left.  His Coumadin was subtherapeutic.  He is being taken urgently to interventional radiology for thrombolysis.  Given his young age I would certainly agree with this aggressive approach.  OCCLUDED AORTO INNOMINATE BYPASS: This was previously open 2 weeks ago on a CT scan done elsewhere.  This could also be an embolic occlusion.  Regardless I do not see any surgical options or endovascular options for this.  PROXIMAL LEFT COMMON CAROTID ARTERY STENOSIS: This will be evaluated at the same time as his arteriogram for thrombolysis.  Not much to add from a vascular standpoint but we will certainly be available as needed.  REASON FOR CONSULT:    Occluded aorto innominate graft.  Left brain stroke, likely embolic.  Stenosis proximal left common carotid artery the consult is requested by Dr. Amie Portland.  HPI:   Jon [name] is a 54 y.o. male who was transferred from [place] for a code stroke.  The patient was reportedly normal this morning but subsequently his family witnessed an acute change with sudden right-sided weakness and expressive aphasia.  He was brought in as a code stroke.   He has a complicated cardiac history.  On 04/22/2012, he underwent repair of a type a aortic dissection by Dr. Modesto Charon.  At that time he had replacement of his aortic valve and coronary artery bypass grafting x1.  In reviewing the op note it was noted that the aortic dissection did extend into the proximal left common carotid artery.  He had a 7 mm Dacron graft that was sewn from the aortic graft to the left common carotid artery.  With a second jump graft to the innominate artery I believe.  Of note the patient is on long-term Coumadin therapy but came in subtherapeutic with an INR of 1.4.  I am unable to obtain any history from the patient.   PAST MEDICAL HISTORY: The patient has a complicated  past medical history including a history of previous stroke and  paroxysmal atrial fibrillation.  FAMILY HISTORY: Unable to obtain  SOCIAL HISTORY: Unable to obtain  REVIEW OF SYSTEMS: Unable to obtain  PHYSICAL EXAM:   Vitals:   05/08/19 1112 05/08/19 1130 05/08/19 1145 05/08/19 1200  BP: 98/63 94/66 (!) 76/57 96/61  Pulse: 66  62 63  Resp: 14  (!) 23 20  Temp: (!) 97.3 F (36.3 C)     TempSrc: Tympanic     SpO2: 97%  98% 97%  Weight:       Body mass index is 32.59 kg/m. GENERAL: The patient is a well-nourished male, in no acute distress. The vital signs are documented above. CARDIAC: There is a regular rate and rhythm.  VASCULAR: I do not detect carotid bruits I cannot palpate a right radial pulse.  He does have a palpable left radial pulse. On the right side he has a palpable femoral, popliteal, and posterior tibial pulse. On the left side he has a palpable femoral, popliteal, posterior tibial, and dorsalis pedis pulse. PULMONARY: There is good air exchange bilaterally without wheezing or rales. ABDOMEN: Soft and non-tender with normal pitched bowel sounds.  MUSCULOSKELETAL: There are no major deformities. NEUROLOGIC: Difficult to assess as he does not fully follow commands.  He does have expressive aphasia and a fairly profound right hemiparesis   DATA:    CT ANGIOGRAM HEAD AND NECK: I have reviewed the images of his  CT angiogram of the head and neck.  There is stenosis and irregularity in the proximal left common carotid artery.  I believe this is where the anastomosis was when he had his dissection repaired in 2013.  I believe there is a jump graft to the innominate artery and this is now thrombosed with reconstitution distally via the vertebral artery which fills the subclavian.    The right common carotid artery and internal carotid artery are markedly narrowed through the siphon region which has a high-grade stenosis.  Beyond the stenosis in the proximal left common  carotid artery the carotid artery on the left is patent without significant stenosis.  Both vertebral arteries are patent.  CT ANGIOGRAM HEAD: This shows that the right ICA is markedly diminutive in size through the siphon region with several areas of high-grade stenosis.  On the left there is an abrupt occlusion of the proximal A2 left anterior cerebral artery with some reconstitution distally.  CEREBRAL PERFUSION STUDY: His cerebral perfusion study shows hypoperfused parenchyma predominantly within the left ACA and ACA/MCA watershed territory as well as the right ACA and MCA watershed territory.  Lab Results  Component Value Date   WBC 16.6 (H) 05/08/2019   HGB 15.6 05/08/2019   HCT 46.0 05/08/2019   MCV 89.1 05/08/2019   PLT 357 05/08/2019   Lab Results  Component Value Date   NA 136 05/08/2019   K 4.1 05/08/2019   CL 102 05/08/2019   CO2 23 05/08/2019   Lab Results  Component Value Date   CREATININE 0.90 05/08/2019   Lab Results  Component Value Date   INR 1.4 (H) 05/08/2019   INR 2.2 (H) 03/30/2019   INR 2.2 (H) 03/04/2019   Lab Results  Component Value Date   HGBA1C 5.0 05/08/2012   CBG (last 3)  Recent Labs    05/08/19 1041  GLUCAP 176*    [facility] [place]: [phone] Office: [phone]

## 2019-05-08 NOTE — H&P (Addendum)
NEURO HOSPITALIST  H&P      Chief Complaint: right side flaccid/ left gaze   History obtained from:  Chart review/EMS/ Mother   HPI:                                                                                                                                         Jon [name] is an 54 y.o. male  with past medical history of a prior stroke with residual dysarthria, type aortic dissection in 2014 with repair that was complicated and involved a brachiocephalic graft to the left carotid as the dissection extended into the brachiocephalic and left carotid, carotid, aortic stenosis status post mechanical valve, presented to the ED as a code stroke for sudden onset of leftward gaze and right-sided flaccid paralysis. It was very difficult to reach his family-number listed for the mother rang busy multiple times.  Finally was able to reach the mother to get history.  Per EMS and mother Jon [name] patient woke up this morning at 0900. He was in his usual state of health, he ate breakfast and they held a conversation. Patient went to the bathroom and had a sudden onset of aphasia, right side weakness. He slumped over but did not fall or hit his head. They assisted patient to the floor. Patient was recently discharged 05/07/19 from [facility]. He was discharged on lovenox shots twice per day d/t INR non therapeutic. According to his mother she had not been giving him the coumadin just the lovenox, but he did not receive lovenox this morning. Last dose lovenox  was 05/07/19 PM.  Per mother patient had been using a walker to ambulate since his hospitalization 1 month ago. Patient on 2L [place] 02 @ baseline.    Attending addendum: Called by Dr. Loreta Ave who spoke with the patient's mother who had arrived to the hospital.  She reports that he had been admitted to the [facility] for weakness of the right lower extremity but was treated for mostly  pneumonia and reported that he been having trouble using the right leg and walking since Christmas.  The acute changes morning was confirmed but history was very inconsistent from what Dr. Loreta Ave received and what was provided to [place] over the phone.   ED course:  CTH: no hemorrhage,  BP:129/76 BG: 192 CTA: occlussion of the proximal A2 left anteriror cerebral arterythrombus in right innominate artery that extends into the right common carotid artery; CTP: core: 125  Date last known well: 05/07/2018 Time last known well: 0900 tPA Given:no, d/t on  lovenox ( anti-coagulation dose) Modified Rankin: Rankin Score=1 NIHSS:15  Past Medical History:  Diagnosis Date  . Aneurysm of thoracic aorta (HCC) 05/06/2012   IMO routine update IMO routine update  . Anxiety    sees Dr. Renae Fickle  . Aortic aneurysm and dissection (HCC) 05/06/2012  . Aortic stenosis    mechanical AVR 04/22/12  . Aortic valve disorder 05/06/2012  . 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 Bicuspid aortic valve   . Cerebral infarction (HCC) 05/11/2012  . Cerebral thrombosis with cerebral infarction (HCC) 07/14/2013  . Cholelithiasis 12/20/2016  . Current use of long term anticoagulation 12/20/2016  . CVA (cerebral infarction)    occipital CVA 12/13  . Dissecting aortic aneurysm (HCC) 05/06/2012  . Dysarthria 09/10/2017  . Dysrhythmia    sees Dr. Geronimo Running, [facility] cardiology in [place]  . Generalized constriction of visual field 10/15/2012   IMOUPDATE IMOUPDATE  . 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 GERD (gastroesophageal reflux disease)   . HCAP (healthcare-associated pneumonia) 05/06/2012  . Headache(784.0)    "due to vision problem"  . History of CVA (cerebrovascular accident) 05/07/2012  . Hypothyroidism   . Idiopathic peripheral neuropathy 07/14/2013  . Late effects of CVA (cerebrovascular accident) 08/08/2015  . Nontraumatic multiple localized intracerebral  hemorrhages (HCC) 10/16/2017  . Paroxysmal atrial fibrillation (HCC) 08/08/2015   On warfarin On warfarin  . Presence of prosthetic heart valve 08/08/2015  . Shortness of breath   . Stroke (HCC)    x3  . Stroke, embolic (HCC) 05/07/2012  . Syncope 05/06/2012  . Thoracic aortic aneurysm (HCC)    replacement aortic root 04/22/12  . VBI (vertebrobasilar insufficiency) 09/10/2017    Past Surgical History:  Procedure Laterality Date  . APPLICATION OF WOUND VAC  06/11/2012   Procedure: APPLICATION OF WOUND VAC;  Surgeon: [name], MD;  Location: [facility] OR;  Service: Vascular;  Laterality: Right;  . CARDIAC CATHETERIZATION    . CHOLECYSTECTOMY    . CORONARY ARTERY BYPASS GRAFT  04/22/2012   Procedure: CORONARY ARTERY BYPASS GRAFTING (CABG);  Surgeon: [name], MD;  Location: [facility] OR;  Service: Open Heart Surgery;  Laterality: N/A;  times one with right greater saphenous vein graft  . I & D EXTREMITY  06/11/2012   Procedure: IRRIGATION AND DEBRIDEMENT EXTREMITY;  Surgeon: [name], MD;  Location: [facility] OR;  Service: Vascular;  Laterality: Right;  . INTRAOPERATIVE TRANSESOPHAGEAL ECHOCARDIOGRAM  04/22/2012   Procedure: INTRAOPERATIVE TRANSESOPHAGEAL ECHOCARDIOGRAM;  Surgeon: [name], MD;  Location: [facility] OR;  Service: Open Heart Surgery;  Laterality: N/A;  . ROTATOR CUFF REPAIR     12 years ago, Left  . THORACIC AORTIC ANEURYSM REPAIR  04/22/2012   Procedure: THORACIC ASCENDING ANEURYSM REPAIR (AAA);  Surgeon: [name], MD;  Location: [facility] OR;  Service: Open Heart Surgery;  Laterality: N/A;    Family History  Problem Relation Age of Onset  . Lung disease Mother   . Diabetes Father   . Peripheral Artery Disease Father   . Hypertension Father   . CAD Father   . Heart disease Father   . Stroke Father   . Cancer Maternal Grandfather   . Heart disease Paternal Grandfather          Social History:  reports that he has been smoking cigarettes. He started  smoking about 7 years ago. He has been smoking about 2.50 packs per day. He has never used smokeless tobacco. He reports current alcohol use. He reports that he does not use drugs.  Allergies:  Allergies  Allergen Reactions  . Hydrocodone-Acetaminophen Other (See Comments)    PT MOTHER DOES NOT REMEMBER REACTION  Only use in Emergency   . Hydromorphone Itching and Rash       . Other Other (See Comments)    ALL NARCOTICS-ITCHING, RASH   . Oxycodone Other (See Comments)    Unknown   . Penicillins Rash  . Dilaudid [Hydromorphone Hcl] Itching and Rash    Medications:                                                                                                                           Current Facility-Administered Medications  Medication Dose Route Frequency Provider Last Rate Last Admin  . sodium chloride flush (NS) 0.9 % injection 3 mL  3 mL Intravenous Once Dorie Rank, MD       Current Outpatient Medications  Medication Sig Dispense Refill  . acetaminophen (TYLENOL) 500 MG tablet Take by mouth.    Marland Kitchen amiodarone (PACERONE) 200 MG tablet Take 1 tablet (200 mg total) by mouth 3 (three) times daily. 90 tablet 1  . aspirin 81 MG chewable tablet Chew 1 tablet (81 mg total) by mouth daily.    Marland Kitchen buPROPion (WELLBUTRIN SR) 150 MG 12 hr tablet Take 1 tablet by mouth 2 (two) times daily.    . busPIRone (BUSPAR) 15 MG tablet Take 15 mg by mouth 2 (two) times daily.     . cyclobenzaprine (FLEXERIL) 10 MG tablet TK 1 T PO TID  0  . DULoxetine (CYMBALTA) 60 MG capsule Take by mouth.    . furosemide (LASIX) 20 MG tablet Take 20 mg by mouth 2 (two) times daily. Takes 2 in am and 1 at lunch    . gabapentin (NEURONTIN) 300 MG capsule Take 300 mg by mouth 2 (two) times daily. Takes 1 in am and 2 in pm    . ibuprofen (ADVIL,MOTRIN) 600 MG tablet Take 600 mg by mouth every 8 (eight) hours.    Marland Kitchen levothyroxine (SYNTHROID, LEVOTHROID) 50 MCG tablet Take 50 mcg by mouth daily before breakfast.    .  montelukast (SINGULAIR) 10 MG tablet Take 10 mg by mouth at bedtime.    Marland Kitchen omeprazole (PRILOSEC) 20 MG capsule Take by mouth.    . potassium chloride (K-DUR) 10 MEQ tablet Take by mouth daily.    Marland Kitchen PROAIR HFA 108 (90 Base) MCG/ACT inhaler INHALE 2 PUFFS BY MOUTH EVERY 4 TO 6 HOURS AS NEEDED  12  . rosuvastatin (CRESTOR) 40 MG tablet Take 40 mg by mouth daily.  12  . topiramate (TOPAMAX) 50 MG tablet TAKE 1 TABLET BY MOUTH IN THE MORNING FOR 7 DAYS THEN TAKE 1 TABLET BY MOUTH TWICE DAILY  5  . warfarin (COUMADIN) 5 MG tablet TAKE 1 & 1/2 TO 2 TABLETS BY MOUTH DAILY AS DIRECTED BY COUMADIN CLINIC 65 tablet 0   ROS:  unobtainable from patient due to mental status  General Examination:                                                                                                      Blood pressure (!) 84/68, pulse 61, temperature (!) 97.3 F (36.3 C), temperature source Tympanic, resp. rate 17, weight 106 kg, SpO2 97 %.  Physical Exam  Constitutional: Appears well-developed and well-nourished.  Psych: Affect appropriate to situation Eyes: Normal external eye and conjunctiva. HENT: Normocephalic, no lesions, without obvious abnormality.   Musculoskeletal-no joint tenderness, deformity or swelling Cardiovascular: Normal rate and regular rhythm.  Respiratory: Effort normal, non-labored breathing saturations WNL on 2.5 L [place] GI: Soft.  No distension. There is no tenderness.  Skin: WDI  Neurological Examination Mental Status: Alert, aphasic, dysarthric. Able to follow some simple commands Cranial Nerves: II: initially patient had a forced gaze to left, but it has improved. No forced gaze, questionable right hemaniopsia (not blinking to threat)  III,IV, VI: ptosis not present, extra-ocular motions intact bilaterally, pupils equal, round, reactive to light and  accommodation V,VII:face appears symmetric,  Decreased sensation right side of face. VIII: hearing normal bilaterally IX,X: uvula rises midline XI: bilateral shoulder shrug XII: midline tongue extension Motor: Right : Upper extremity   0/5   RLE: able to raise anti gravity, but still falls to bed LUE and LLE: 5/5 Tone and bulk:normal tone throughout; no atrophy noted Sensory: able to localize and withdraw to noxious BLE.  Deep Tendon Reflexes: 2+ and symmetric biceps and patella Cerebellar: Ataxia noted with left FNF Gait: deferred   Lab Results: Basic Metabolic Panel: Recent Labs  Lab 05/08/19 1047  NA 136  K 4.1  CL 102  GLUCOSE 192*  BUN 18  CREATININE 0.90    CBC: Recent Labs  Lab 05/08/19 1041 05/08/19 1047  WBC 16.6*  --   NEUTROABS 14.2*  --   HGB 14.7 15.6  HCT 45.1 46.0  MCV 89.1  --   PLT 357  --     CBG: Recent Labs  Lab 05/08/19 1041  GLUCAP 176*    Imaging: CT Code Stroke CTA Head W/WO contrast  Addendum Date: 05/08/2019   ADDENDUM REPORT: 05/08/2019 11:48 ADDENDUM: Upon further review, there is likely greater stenosis within the proximal left common carotid artery than originally stated. This is on the basis of circumferential plaque or thrombus. Exact quantification of stenosis is difficult due to vessel tortuosity, but likely moderate/severe. Addended findings called by telephone at the time of interpretation on 05/08/2019 at 11:47 am to provider [facility] , who verbally acknowledged these results. Electronically Signed   By: Jackey Loge DO   On: 05/08/2019 11:48   Result Date: 05/08/2019 CLINICAL DATA:  Stroke, follow-up. EXAM: CT ANGIOGRAPHY HEAD AND NECK CT PERFUSION BRAIN TECHNIQUE: Multidetector CT imaging of the head and neck was performed using the standard protocol during bolus administration of intravenous contrast. Multiplanar CT image reconstructions and MIPs were obtained to evaluate the vascular anatomy. Carotid stenosis measurements  (when applicable) are obtained utilizing NASCET criteria, using  the distal internal carotid diameter as the denominator. CONTRAST:  Administered contrast not known at this time. 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 The proximal right common carotid artery also appears partially thrombosed with high-grade stenosis. Right carotid system: Clot within the proximal right common carotid artery with high-grade stenosis. The stenosis gradually diminishes within the distal common carotid artery. There is circumferential hypoattenuation within the distal common which may reflect plaque or eccentric thrombus. Eccentric plaque is also present within the carotid bulb without significant narrowing. The there is abrupt caliber change towards the cephalad aspect of the carotid bulb with the cervical ICA significantly asymmetrically diminutive as compared to the right throughout the remainder of the neck and through the siphon region with sites of high-grade stenosis within the siphon region. Left carotid system: The common carotid artery is patent without significant stenosis (50% or greater). Eccentric plaque or thrombus within the proximal common carotid artery. The ICA is patent without significant stenosis. Mild mixed plaque within the left carotid bulb. Vertebral arteries: Left vertebral artery slightly dominant. The bilateral vertebral arteries are patent throughout the neck without significant stenosis. Skeleton: No acute bony abnormality. Other neck: No soft tissue neck mass or cervical lymphadenopathy. Upper chest: Partially imaged extensive airspace opacities within the left lung apex suspicious for pneumonia.  Review of the MIP images confirms the above findings CTA HEAD FINDINGS Anterior circulation: Markedly diminutive intracranial right ICA through the siphon region with sites of high-grade stenosis. Mixed plaque also results in mild-to-moderate narrowing of the supraclinoid right ICA. The M1 right middle cerebral artery is patent without significant proximal stenosis. Atherosclerotic irregularity of M2 right MCA branches with no proximal branch occlusion identified. The right anterior cerebral artery is patent without proximal branch occlusion or high-grade proximal stenosis. The M1 left middle cerebral artery is patent without significant stenosis. Atherosclerotic irregularity of M2 left MCA branch vessels without proximal branch occlusion identified. Abrupt occlusion of the proximal A2 left anterior cerebral artery. There appears to be some reconstitution of the distal left ACA. Posterior circulation: The intracranial vertebral arteries are patent without significant stenosis, as is the basilar artery. The bilateral posterior cerebral arteries are patent without proximal branch occlusion or high-grade proximal arterial stenosis. Posterior communicating arteries are hypoplastic or absent bilaterally. Venous sinuses: Within limitations of contrast timing, no convincing thrombus. Anatomic variants: As described Review of the MIP images confirms the above findings These results were called by telephone at the time of interpretation on 05/08/2019 at 11:15 am to [facility] , who verbally acknowledged these results. IMPRESSION: IMPRESSION CTA neck: 1. 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 2. 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 Thrombus extends into the proximal right common carotid artery with high-grade stenosis. There is a progressive decrease in this stenosis towards the distal CCA. There is abrupt  caliber change of the cervical right ICA just distal to the bulb. New from prior examinations, the cervical ICA is markedly asymmetrically diminutive throughout the neck and through the siphon region. Sites of high-grade stenosis within the siphon region. 3. The left common and cervical internal carotid arteries are patent without significant stenosis. Circumferential plaque or thrombus  within the proximal left common carotid artery. 4. Bilateral vertebral arteries patent within the neck without significant stenosis. 5. Extensive airspace disease within the partially imaged left lung apex suspicious for pneumonia. CTA head: 1. The right ICA is markedly diminutive through the siphon region with sites of high-grade stenosis. Mixed plaque also results in mild/moderate stenosis of the supraclinoid right ICA. 2. Abrupt occlusion of the proximal A2 left anterior cerebral artery. There appears to be some reconstitution of the left ACA distally. 3. No other intracranial large vessel occlusion or proximal high-grade arterial stenosis is identified. Electronically Signed: By: Jackey Loge DO On: 05/08/2019 11:39   CT Code Stroke CTA Neck W/WO contrast  Addendum Date: 05/08/2019   ADDENDUM REPORT: 05/08/2019 11:48 ADDENDUM: Upon further review, there is likely greater stenosis within the proximal left common carotid artery than originally stated. This is on the basis of circumferential plaque or thrombus. Exact quantification of stenosis is difficult due to vessel tortuosity, but likely moderate/severe. Addended findings called by telephone at the time of interpretation on 05/08/2019 at 11:47 am to provider [facility] , who verbally acknowledged these results. Electronically Signed   By: Jackey Loge DO   On: 05/08/2019 11:48   Result Date: 05/08/2019 CLINICAL DATA:  Stroke, follow-up. EXAM: CT ANGIOGRAPHY HEAD AND NECK CT PERFUSION BRAIN TECHNIQUE: Multidetector CT imaging of the head and neck was performed using the  standard protocol during bolus administration of intravenous contrast. Multiplanar CT image reconstructions and MIPs were obtained to evaluate the vascular anatomy. Carotid stenosis measurements (when applicable) are obtained utilizing NASCET criteria, using the distal internal carotid diameter as the denominator. CONTRAST:  Administered contrast not known at this time. 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 The proximal right common carotid artery also appears partially thrombosed with high-grade stenosis. Right carotid system: Clot within the proximal right common carotid artery with high-grade stenosis. The stenosis gradually diminishes within the distal common carotid artery. There is circumferential hypoattenuation within the distal common which may reflect plaque or eccentric thrombus. Eccentric plaque is also present within the carotid bulb without significant narrowing. The there is abrupt caliber change towards the cephalad aspect of the carotid bulb with the cervical ICA significantly asymmetrically diminutive as compared to the right throughout the remainder of the neck and through the siphon region with sites of high-grade stenosis within the siphon region. Left carotid system: The common carotid artery is patent without significant stenosis (50% or greater). Eccentric plaque or thrombus within the proximal common carotid artery. The ICA is patent without significant stenosis. Mild mixed plaque within the left carotid bulb. Vertebral arteries: Left vertebral artery slightly dominant. The bilateral vertebral arteries are patent throughout the neck without significant stenosis. Skeleton: No acute bony  abnormality. Other neck: No soft tissue neck mass or cervical lymphadenopathy. Upper chest: Partially imaged extensive airspace opacities within the left lung apex suspicious for pneumonia. Review of the MIP images confirms the above findings CTA HEAD FINDINGS Anterior circulation: Markedly diminutive intracranial right ICA through the siphon region with sites of high-grade stenosis. Mixed plaque also results in mild-to-moderate narrowing of the supraclinoid right ICA. The M1 right middle cerebral artery is patent without significant proximal stenosis. Atherosclerotic irregularity of M2 right MCA branches with  no proximal branch occlusion identified. The right anterior cerebral artery is patent without proximal branch occlusion or high-grade proximal stenosis. The M1 left middle cerebral artery is patent without significant stenosis. Atherosclerotic irregularity of M2 left MCA branch vessels without proximal branch occlusion identified. Abrupt occlusion of the proximal A2 left anterior cerebral artery. There appears to be some reconstitution of the distal left ACA. Posterior circulation: The intracranial vertebral arteries are patent without significant stenosis, as is the basilar artery. The bilateral posterior cerebral arteries are patent without proximal branch occlusion or high-grade proximal arterial stenosis. Posterior communicating arteries are hypoplastic or absent bilaterally. Venous sinuses: Within limitations of contrast timing, no convincing thrombus. Anatomic variants: As described Review of the MIP images confirms the above findings These results were called by telephone at the time of interpretation on 05/08/2019 at 11:15 am to provider [facility]zzle , who verbally acknowledged these results. IMPRESSION: IMPRESSION CTA neck: 1. 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 2. New from prior examinations, there is thrombosis of the right  innominate artery. Thrombus extends into the proximal right common carotid artery with high-grade stenosis. There is a progressive decrease in this stenosis towards the distal CCA. There is abrupt caliber change of the cervical right ICA just distal to the bulb. New from prior examinations, the cervical ICA is markedly asymmetrically diminutive throughout the neck and through the siphon region. Sites of high-grade stenosis within the siphon region. 3. The left common and cervical internal carotid arteries are patent without significant stenosis. Circumferential plaque or thrombus within the proximal left common carotid artery. 4. Bilateral vertebral arteries patent within the neck without significant stenosis. 5. Extensive airspace disease within the partially imaged left lung apex suspicious for pneumonia. CTA head: 1. The right ICA is markedly diminutive through the siphon region with sites of high-grade stenosis. Mixed plaque also results in mild/moderate stenosis of the supraclinoid right ICA. 2. Abrupt occlusion of the proximal A2 left anterior cerebral artery. There appears to be some reconstitution of the left ACA distally. 3. No other intracranial large vessel occlusion or proximal high-grade arterial stenosis is identified. Electronically Signed: By: Jackey LogeKyle  Golden DO On: 05/08/2019 11:39   CT HEAD CODE STROKE WO CONTRAST  Result Date: 05/08/2019 CLINICAL DATA:  Code stroke. Neuro deficit, acute, stroke suspected. EXAM: CT HEAD WITHOUT CONTRAST TECHNIQUE: Contiguous axial images were obtained from the base of the skull through the vertex without intravenous contrast. COMPARISON:  Head CT 10/26/2018 FINDINGS: Brain: Abnormal hypodensity within the body of the corpus callosum eccentric to the left also possibly involving portions left pericallosal gyrus, measuring 2.1 x 1.1 cm in transaxial dimensions (series 3, image 19) (series 6, image 35). No evidence of acute intracranial hemorrhage. No demarcated  cortical infarction. No evidence of intracranial mass. No midline shift or extra-axial fluid collection. Cerebral volume is normal for age. Vascular: No hyperdense vessel. Skull: Normal. Negative for fracture or focal lesion. Sinuses/Orbits: Visualized orbits demonstrate no acute abnormality. These results were called by telephone at the time of interpretation on 05/08/2019 at 11:01 am to provider Dr. Wilford CornerArora, who verbally acknowledged these results. IMPRESSION: 2.1 x 1.1 cm focus of abnormal hypodensity within the left aspect of the callosal body and possibly also involving the pericallosal gyrus on the left. Findings are new from prior examination 10/26/2018 and may reflect an acute infarct or other lesion. Consider brain MRI for further evaluation. Electronically Signed   By: Jackey LogeKyle  Golden DO  On: 05/08/2019 11:01   Valentina Lucks, MSN, NP-C [facility] [phone]  05/08/2019, 10:58 AM   Attending physician note to follow with Assessment and plan .   Assessment: 54 y.o. 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 Initial NIH stroke scale 15. tPA not given due to being on therapeutic Lovenox-last dose last night. 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 Case discussed in detail with the daughter after having trouble reaching the mother upon multiple phone calls. Case discussed extensively with Dr. Loreta Ave from interventional neuroradiology as well as CV surgery and vascular  surgery-Drs. Dixon and Cornelius Moras, regarding the innominate graft and the prior aortic dissection and how that would play out with any endovascular intervention for a potential mechanical thrombectomy of the left A1. After extensive discussions with both vascular surgery and CV surgery, decision made to proceed for mechanical thrombectomy. Case discussed in detail with the daughter Marchelle Folks over the phone, who consented for the procedure.  Recommendations:  Acute Ischemic Stroke Cerebral infarction due to embolism of left anterior cerebral artery  Stenosis of bilateral carotid arteries  Acuity: Acute Current Suspected Etiology: Cardioembolic versus atheroembolic Continue Evaluation:  -Admit to: Neuro ICU -Continue Aspirin 325 -Continue Statin -MRI/ECHO/A1C/Lipid panel. -Hyperglycemia management per SSI to maintain glucose 140-180mg /dL. -PT/OT/ST therapies and recommendations when able  CNS -Close neuro monitoring  Dysarthria Dysphagia following cerebral infarction  -NPO until cleared by speech -ST  Hemiplegia and hemiparesis following cerebral infarction affecting right dominant side -PT/OT -PM&R consult  Toxic encephalopathy Anoxic encephalopathy -Correct metabolic causes -Monitor  RESP Acute Respiratory Failure  -vent management per ICU-appreciate PCCM assistance. -wean when able  CV Essential (primary) hypertension -BP goal after IR results -Titrate oral agents -TTE Hyperlipidemia, unspecified  - Statin for goal LDL < 70  Status post aortic dissection and aortic valve repair with mechanical heart valve -Subtherapeutic on Coumadin -Heparin gtt (stroke protocol)  HEME -Monitor -transfuse for hgb < 7  ENDO Type 2 diabetes mellitus with hyperglycemia  -SSI -Start oral meds -goal HgbA1c < 7  GI/GU -Gentle hydration -avoid nephrotoxic agents  ID Possible Aspiration PNA-has leukocytosis. -CXR -NPO -Monitor Check for evidence of UTI given  leukocytosis. Would appreciate PCCM assistance in management.  Nutrition E66.9 Obesity  E46 Protein-Calorie Malnutrition Mild Moderate Severe -diet consult  Prophylaxis DVT:  scd GI: ppi Bowel: doc/senna   Diet: NPO   Code Status: Was full code for the duration of the procedure.  After the procedure, daughter says that he has expressed very clearly that he would like to be DNR and we would respect his wishes and change CODE STATUS to DNR    THE FOLLOWING WERE PRESENT ON ADMISSION: Acute ischemic stroke Mechanical heart valve Possible aspiration pneumonia CHF DNR  Discussed in detail with the daughter Ms. Jeannett Senior.  Confirmed CODE STATUS.  -- Milon Dikes, MD [facility] Pager: [phone] If 7pm to 7am, please call on call as listed on AMION.   CRITICAL CARE ATTESTATION Performed by: Milon Dikes, MD Total critical care time: 75 minutes Critical care time was exclusive of separately billable procedures  and treating other patients and/or supervising APPs/Residents/Students Critical care was necessary to treat or prevent imminent or life-threatening deterioration due to acute ischemic stroke This patient is critically ill and at significant risk for neurological worsening and/or death and care requires constant monitoring. Critical care was time spent personally by me on the following activities: development of treatment plan with patient and/or surrogate as well as nursing, discussions with consultants, evaluation of patient's response to treatment, examination of patient, obtaining history from patient or surrogate, ordering and performing treatments and interventions, ordering and review of laboratory studies, ordering and review of radiographic studies, pulse oximetry, re-evaluation of patient's condition, participation in multidisciplinary rounds and medical decision making of high complexity in the care of this patient.

## 2019-05-08 NOTE — Anesthesia Preprocedure Evaluation (Signed)
Anesthesia Evaluation  Patient identified by MRN, date of birth, ID band Patient confused    Reviewed: Allergy & Precautions, Patient's Chart, lab work & pertinent test results, Unable to perform ROS - Chart review onlyPreop documentation limited or incomplete due to emergent nature of procedure.  History of Anesthesia Complications Negative for: history of anesthetic complications  Airway Mallampati: III  TM Distance: >3 FB     Dental  (+) Dental Advisory Given   Pulmonary Current Smoker,    breath sounds clear to auscultation       Cardiovascular + CAD, + CABG, + Peripheral Vascular Disease and +CHF  + dysrhythmias Atrial Fibrillation + pacemaker + Valvular Problems/Murmurs (mechanical AV)  Rhythm:Regular Rate:Bradycardia   S/p AVR, CABG, and thoracic aortic aneurysm repair  '20 TTE (Care Everywhere) - Severe global hypokinesis and ejection fraction is visually estimated at <20%. Diastolic function is indeterminate. Mechanical prosthetic valve in aortic position (reported as bileaflet) was not well visualized, with stable transvalvular gradients 25 and 15 mm Hg peak and mean (VTI 43) . Prior study had 29/15 respectively (VTI 46). Trace MR, mild TR.    Neuro/Psych  Headaches, PSYCHIATRIC DISORDERS Anxiety  Neuromuscular disease (neuropathy) CVA, Residual Symptoms    GI/Hepatic GERD  ,  Endo/Other  Hypothyroidism   Renal/GU      Musculoskeletal   Abdominal   Peds  Hematology  On coumadin, subtherapeutic INR (1.4)    Anesthesia Other Findings   Reproductive/Obstetrics                             Anesthesia Physical Anesthesia Plan  ASA: IV and emergent  Anesthesia Plan: General   Post-op Pain Management:    Induction: Intravenous  PONV Risk Score and Plan: 2 and Treatment may vary due to age or medical condition and Ondansetron  Airway Management Planned: Oral ETT  Additional  Equipment: Arterial line  Intra-op Plan:   Post-operative Plan: Possible Post-op intubation/ventilation  Informed Consent:     Dental advisory given, Only emergency history available and History available from chart only  Plan Discussed with: CRNA, Anesthesiologist and Surgeon  Anesthesia Plan Comments: (Plan to extubate if able at end of procedure.)        Anesthesia Quick Evaluation

## 2019-05-08 NOTE — Progress Notes (Signed)
Following a-line for blood pressures and titration of levophed.

## 2019-05-09 ENCOUNTER — Inpatient Hospital Stay (HOSPITAL_COMMUNITY): Payer: Medicaid Other

## 2019-05-09 DIAGNOSIS — I340 Nonrheumatic mitral (valve) insufficiency: Secondary | ICD-10-CM

## 2019-05-09 DIAGNOSIS — I639 Cerebral infarction, unspecified: Secondary | ICD-10-CM

## 2019-05-09 DIAGNOSIS — I361 Nonrheumatic tricuspid (valve) insufficiency: Secondary | ICD-10-CM

## 2019-05-09 LAB — BASIC METABOLIC PANEL
Anion gap: 8 (ref 5–15)
BUN: 11 mg/dL (ref 6–20)
CO2: 22 mmol/L (ref 22–32)
Calcium: 8 mg/dL — ABNORMAL LOW (ref 8.9–10.3)
Chloride: 107 mmol/L (ref 98–111)
Creatinine, Ser: 0.82 mg/dL (ref 0.61–1.24)
GFR calc Af Amer: >60 mL/min (ref >60)
GFR calc non Af Amer: >60 mL/min (ref >60)
Glucose, Bld: 145 mg/dL — ABNORMAL HIGH (ref 70–99)
Potassium: 3.9 mmol/L (ref 3.5–5.1)
Sodium: 137 mmol/L (ref 135–145)

## 2019-05-09 LAB — COMPREHENSIVE METABOLIC PANEL
ALT: 63 U/L — ABNORMAL HIGH (ref 0–44)
AST: 34 U/L (ref 15–41)
Albumin: 2.6 g/dL — ABNORMAL LOW (ref 3.5–5.0)
Alkaline Phosphatase: 116 U/L (ref 38–126)
Anion gap: 15 (ref 5–15)
BUN: 10 mg/dL (ref 6–20)
CO2: 17 mmol/L — ABNORMAL LOW (ref 22–32)
Calcium: 7.8 mg/dL — ABNORMAL LOW (ref 8.9–10.3)
Chloride: 106 mmol/L (ref 98–111)
Creatinine, Ser: 0.78 mg/dL (ref 0.61–1.24)
GFR calc Af Amer: >60 mL/min (ref >60)
GFR calc non Af Amer: >60 mL/min (ref >60)
Glucose, Bld: 138 mg/dL — ABNORMAL HIGH (ref 70–99)
Potassium: 3.7 mmol/L (ref 3.5–5.1)
Sodium: 138 mmol/L (ref 135–145)
Total Bilirubin: 0.6 mg/dL (ref 0.3–1.2)
Total Protein: 5.3 g/dL — ABNORMAL LOW (ref 6.5–8.1)

## 2019-05-09 LAB — TROPONIN I (HIGH SENSITIVITY)
Troponin I (High Sensitivity): 228 ng/L (ref <18)
Troponin I (High Sensitivity): 236 ng/L (ref <18)
Troponin I (High Sensitivity): 226 ng/L (ref <18)
Troponin I (High Sensitivity): 230 ng/L (ref <18)

## 2019-05-09 LAB — RESPIRATORY PANEL BY PCR

## 2019-05-09 LAB — MAGNESIUM: Magnesium: 2.1 mg/dL (ref 1.7–2.4)

## 2019-05-09 LAB — GLUCOSE, CAPILLARY
Glucose-Capillary: 132 mg/dL — ABNORMAL HIGH (ref 70–99)
Glucose-Capillary: 124 mg/dL — ABNORMAL HIGH (ref 70–99)
Glucose-Capillary: 140 mg/dL — ABNORMAL HIGH (ref 70–99)
Glucose-Capillary: 108 mg/dL — ABNORMAL HIGH (ref 70–99)
Glucose-Capillary: 101 mg/dL — ABNORMAL HIGH (ref 70–99)
Glucose-Capillary: 101 mg/dL — ABNORMAL HIGH (ref 70–99)

## 2019-05-09 LAB — CBC WITH DIFFERENTIAL/PLATELET
Abs Immature Granulocytes: 0.29 10*3/uL — ABNORMAL HIGH (ref 0.00–0.07)
Basophils Absolute: 0 10*3/uL (ref 0.0–0.1)
Basophils Relative: 0 %
Eosinophils Absolute: 0.3 10*3/uL (ref 0.0–0.5)
Eosinophils Relative: 1 %
HCT: 39.1 % (ref 39.0–52.0)
Hemoglobin: 12.7 g/dL — ABNORMAL LOW (ref 13.0–17.0)
Immature Granulocytes: 2 %
Lymphocytes Relative: 8 %
Lymphs Abs: 1.5 10*3/uL (ref 0.7–4.0)
MCH: 29.2 pg (ref 26.0–34.0)
MCHC: 32.5 g/dL (ref 30.0–36.0)
MCV: 89.9 fL (ref 80.0–100.0)
Monocytes Absolute: 1.2 10*3/uL — ABNORMAL HIGH (ref 0.1–1.0)
Monocytes Relative: 7 %
Neutro Abs: 14.8 10*3/uL — ABNORMAL HIGH (ref 1.7–7.7)
Neutrophils Relative %: 82 %
Platelets: 370 10*3/uL (ref 150–400)
RBC: 4.35 MIL/uL (ref 4.22–5.81)
RDW: 15.5 % (ref 11.5–15.5)
WBC: 18.1 10*3/uL — ABNORMAL HIGH (ref 4.0–10.5)
nRBC: 0 % (ref 0.0–0.2)

## 2019-05-09 LAB — HEPARIN LEVEL (UNFRACTIONATED)
Heparin Unfractionated: 0.15 IU/mL — ABNORMAL LOW (ref 0.30–0.70)
Heparin Unfractionated: 0.25 IU/mL — ABNORMAL LOW (ref 0.30–0.70)
Heparin Unfractionated: 0.29 IU/mL — ABNORMAL LOW (ref 0.30–0.70)

## 2019-05-09 LAB — LIPID PANEL
Cholesterol: 132 mg/dL (ref 0–200)
HDL: 34 mg/dL — ABNORMAL LOW (ref >40)
LDL Cholesterol: 67 mg/dL (ref 0–99)
Total CHOL/HDL Ratio: 3.9 RATIO
Triglycerides: 157 mg/dL — ABNORMAL HIGH (ref <150)
VLDL: 31 mg/dL (ref 0–40)

## 2019-05-09 LAB — PROTIME-INR
INR: 1.5 — ABNORMAL HIGH (ref 0.8–1.2)
Prothrombin Time: 17.5 seconds — ABNORMAL HIGH (ref 11.4–15.2)

## 2019-05-09 LAB — HEMOGLOBIN A1C
Hgb A1c MFr Bld: 6.9 % — ABNORMAL HIGH (ref 4.8–5.6)
Mean Plasma Glucose: 151.33 mg/dL

## 2019-05-09 LAB — LACTIC ACID, PLASMA: Lactic Acid, Venous: 1 mmol/L (ref 0.5–1.9)

## 2019-05-09 LAB — PHOSPHORUS: Phosphorus: 3.5 mg/dL (ref 2.5–4.6)

## 2019-05-09 LAB — ECHOCARDIOGRAM COMPLETE
Height: 71 in
Weight: 3739 oz

## 2019-05-09 LAB — PROCALCITONIN: Procalcitonin: <0.1 ng/mL

## 2019-05-09 MED ORDER — ORAL CARE MOUTH RINSE
15.0000 mL | Freq: Two times a day (BID) | OROMUCOSAL | Status: DC
  Administered 2019-05-09 – 2019-05-13 (×10): 15 mL via OROMUCOSAL

## 2019-05-09 MED ORDER — CHLORHEXIDINE GLUCONATE 0.12 % MT SOLN
15.0000 mL | Freq: Two times a day (BID) | OROMUCOSAL | Status: DC
  Administered 2019-05-09 – 2019-05-13 (×9): 15 mL via OROMUCOSAL
  Filled 2019-05-09 (×4): qty 15

## 2019-05-09 MED ORDER — ASPIRIN 300 MG RE SUPP
300.0000 mg | Freq: Every day | RECTAL | Status: DC
  Administered 2019-05-09: 300 mg via RECTAL
  Filled 2019-05-09: qty 1

## 2019-05-09 NOTE — Progress Notes (Signed)
[place] for Heparin Indication: stroke and mechanical valve  Allergies  Allergen Reactions  . Hydrocodone-Acetaminophen Other (See Comments)    PT MOTHER DOES NOT REMEMBER REACTION  Only use in Emergency   . Hydromorphone Itching and Rash       . Other Other (See Comments)    ALL NARCOTICS-ITCHING, RASH   . Oxycodone Other (See Comments)    Unknown reaction  . Penicillins Rash    Did it involve swelling of the face/tongue/throat, SOB, or low BP? Unknown Did it involve sudden or severe rash/hives, skin peeling, or any reaction on the inside of your mouth or nose? Unknown Did you need to seek medical attention at a hospital or doctor's office? Unknown When did it last happen?unknown If all above answers are "NO", may proceed with cephalosporin use.  . Dilaudid [Hydromorphone Hcl] Itching and Rash    Patient Measurements: Height: 5\' 11"  (180.3 cm) Weight: 233 lb 11 oz (106 kg) IBW/kg (Calculated) : 75.3 Heparin Dosing Weight: 97.7kg  Vital Signs: Temp: 98.1 F (36.7 C) (01/02 2000) Temp Source: Axillary (01/02 2000) BP: 134/79 (01/02 2000) Pulse Rate: 61 (01/02 2000)  Labs: Recent Labs    05/08/19 1041 05/08/19 1047 05/08/19 1047 05/08/19 1810 05/08/19 2357 05/09/19 0107 05/09/19 0112 05/09/19 0504 05/09/19 1028 05/09/19 2009  HGB 14.7 15.6  --  12.6*  --   --   --  12.7*  --   --   HCT 45.1 46.0  --  37.0*  --   --   --  39.1  --   --   PLT 357  --   --   --   --   --   --  370  --   --   APTT 25  --   --   --   --   --   --   --   --   --   LABPROT 17.4*  --   --   --   --   --   --  17.5*  --   --   INR 1.4*  --   --   --   --   --   --  1.5*  --   --   HEPARINUNFRC  --   --   --   --   --  0.15*  --   --  0.25* 0.29*  CREATININE 1.02 0.90  --   --  0.82  --  0.78  --   --   --   TROPONINIHS  --   --    < >  --  228*  --  236* 226* 230*  --    < > = values in this interval not displayed.    Estimated  Creatinine Clearance: 132.3 mL/min (by C-G formula based on SCr of 0.78 mg/dL).   Medical History: Past Medical History:  Diagnosis Date  . Aneurysm of thoracic aorta ([place]) 05/06/2012   IMO routine update IMO routine update  . Anxiety    sees Dr. Wende Neighbors  . Aortic aneurysm and dissection ([place]) 05/06/2012  . Aortic stenosis    mechanical AVR 04/22/12  . Aortic valve disorder 05/06/2012  . 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 Bicuspid aortic valve   . Cerebral infarction ([place]) 05/11/2012  . Cerebral thrombosis with cerebral infarction ([place]) 07/14/2013  . Cholelithiasis 12/20/2016  . Current use of long term anticoagulation 12/20/2016  .  CVA (cerebral infarction)    occipital CVA 12/13  . Dissecting aortic aneurysm (H[facility]) 05/06/2012  . Dysarthria 09/10/2017  . Dysrhythmia    sees Dr. Geronimo Running, [facility] cardiology in [place]  . Generalized constriction of visual field 10/15/2012   IMOUPDATE IMOUPDATE  . 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 GERD (gastroesophageal reflux disease)   . HCAP (healthcare-associated pneumonia) 05/06/2012  . Headache(784.0)    "due to vision problem"  . History of CVA (cerebrovascular accident) 05/07/2012  . Hypothyroidism   . Idiopathic peripheral neuropathy 07/14/2013  . Late effects of CVA (cerebrovascular accident) 08/08/2015  . Nontraumatic multiple localized intracerebral hemorrhages (HCC) 10/16/2017  . Paroxysmal atrial fibrillation (HCC) 08/08/2015   On warfarin On warfarin  . Presence of prosthetic heart valve 08/08/2015  . Shortness of breath   . Stroke (HCC)    x3  . Stroke, embolic (HCC) 05/07/2012  . Syncope 05/06/2012  . Thoracic aortic aneurysm (HCC)    replacement aortic root 04/22/12  . VBI (vertebrobasilar insufficiency) 09/10/2017    Medications:  Infusions:  . clevidipine    . heparin 1,350 Units/hr (05/09/19 2000)  . norepinephrine (LEVOPHED) Adult infusion 4 mcg/min (05/09/19 2000)  .  propofol (DIPRIVAN) infusion Stopped (05/09/19 0850)    Assessment: 53 yom presented to the ED with stroke symptoms. S/p recent discharge from [facility]. He is supposed to be on warfarin and lovenox for history of a mechanical valve. INR is subtherapeutic at 1.4 and last dose of lovenox was last night. Pt is now s/p IR to on IV heparin -heparin level= 0.29  Goal of Therapy:  Heparin level 0.3-0.5 units/ml Monitor platelets by anticoagulation protocol: Yes   Plan:  Increase heparin gtt to 1450 units/hr Daily heparin level and CBC  Harland German, PharmD Clinical Pharmacist **Pharmacist phone directory can now be found on [email or web address] ([email or web address]).  Listed under [facility].

## 2019-05-09 NOTE — Progress Notes (Signed)
[name] for Heparin Indication: stroke and mechanical valve  Allergies  Allergen Reactions  . Hydrocodone-Acetaminophen Other (See Comments)    PT MOTHER DOES NOT REMEMBER REACTION  Only use in Emergency   . Hydromorphone Itching and Rash       . Other Other (See Comments)    ALL NARCOTICS-ITCHING, RASH   . Oxycodone Other (See Comments)    Unknown reaction  . Penicillins Rash    Did it involve swelling of the face/tongue/throat, SOB, or low BP? Unknown Did it involve sudden or severe rash/hives, skin peeling, or any reaction on the inside of your mouth or nose? Unknown Did you need to seek medical attention at a hospital or doctor's office? Unknown When did it last happen?unknown If all above answers are "NO", may proceed with cephalosporin use.  . Dilaudid [Hydromorphone Hcl] Itching and Rash    Patient Measurements: Height: 5\' 11"  (180.3 cm) Weight: 233 lb 11 oz (106 kg) IBW/kg (Calculated) : 75.3 Heparin Dosing Weight: 97.7kg  Vital Signs: Temp: 98.4 F (36.9 C) (01/02 0000) Temp Source: Oral (01/02 0000) BP: 88/76 (01/02 0130) Pulse Rate: 52 (01/02 0130)  Labs: Recent Labs    05/08/19 1041 05/08/19 1047 05/08/19 1810 05/08/19 2030 05/08/19 2357 05/09/19 0107  HGB 14.7 15.6 12.6*  --   --   --   HCT 45.1 46.0 37.0*  --   --   --   PLT 357  --   --   --   --   --   APTT 25  --   --   --   --   --   LABPROT 17.4*  --   --   --   --   --   INR 1.4*  --   --   --   --   --   HEPARINUNFRC  --   --   --   --   --  0.15*  CREATININE 1.02 0.90  --   --  0.82  --   TROPONINIHS  --   --   --  240* 228*  --     Estimated Creatinine Clearance: 129.1 mL/min (by C-G formula based on SCr of 0.82 mg/dL).   Medical History: Past Medical History:  Diagnosis Date  . Aneurysm of thoracic aorta ([place]) 05/06/2012   IMO routine update IMO routine update  . Anxiety    sees Dr. Wende Neighbors  . Aortic aneurysm and dissection  ([place]) 05/06/2012  . Aortic stenosis    mechanical AVR 04/22/12  . Aortic valve disorder 05/06/2012  . 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 Bicuspid aortic valve   . Cerebral infarction ([place]) 05/11/2012  . Cerebral thrombosis with cerebral infarction ([place]) 07/14/2013  . Cholelithiasis 12/20/2016  . Current use of long term anticoagulation 12/20/2016  . CVA (cerebral infarction)    occipital CVA 12/13  . Dissecting aortic aneurysm ([place]) 05/06/2012  . Dysarthria 09/10/2017  . Dysrhythmia    sees Dr. Leslie Dales, [facility] cardiology in [place]  . Generalized constriction of visual field 10/15/2012   IMOUPDATE IMOUPDATE  . 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 GERD (gastroesophageal reflux disease)   . HCAP (healthcare-associated pneumonia) 05/06/2012  . Headache(784.0)    "due to vision problem"  . History of CVA (cerebrovascular accident) 05/07/2012  . Hypothyroidism   . Idiopathic peripheral neuropathy 07/14/2013  . Late effects of CVA (cerebrovascular accident) 08/08/2015  . Nontraumatic multiple  localized intracerebral hemorrhages (HCC) 10/16/2017  . Paroxysmal atrial fibrillation (HCC) 08/08/2015   On warfarin On warfarin  . Presence of prosthetic heart valve 08/08/2015  . Shortness of breath   . Stroke (HCC)    x3  . Stroke, embolic (HCC) 05/07/2012  . Syncope 05/06/2012  . Thoracic aortic aneurysm (HCC)    replacement aortic root 04/22/12  . VBI (vertebrobasilar insufficiency) 09/10/2017    Medications:  Infusions:  . sodium chloride 75 mL/hr at 05/09/19 0100  . clevidipine    . dextrose 5% lactated ringers 50 mL/hr at 05/09/19 0100  . heparin 1,000 Units/hr (05/09/19 0100)  . norepinephrine (LEVOPHED) Adult infusion 12 mcg/min (05/09/19 0100)  . propofol (DIPRIVAN) infusion 5 mcg/kg/min (05/09/19 0000)    Assessment: 53 yom presented to the ED with stroke symptoms. S/p recent discharge from [facility]. He is supposed to be on  warfarin and lovenox for history of a mechanical valve. INR is subtherapeutic at 1.4 and last dose of lovenox was last night. Pt is now s/p IR to start IV heparin. CBC is WNL.   1/2 AM update:  Heparin level below goal No issues per RN  Goal of Therapy:  Heparin level 0.3-0.5 units/ml Monitor platelets by anticoagulation protocol: Yes   Plan:  No boluses  Inc heparin drip to 1150 units/hr Re-check heparin level at 1000  Abran Duke, PharmD, BCPS Clinical Pharmacist Phone: [phone]

## 2019-05-09 NOTE — Progress Notes (Signed)
RN notified that pacemaker rep unable to come until Monday 05/11/2019 to do MRI with pt. MD made aware.

## 2019-05-09 NOTE — Progress Notes (Signed)
SLP Cancellation Note  Patient Details Name: Jon [name] MRN: [id number] DOB: 01/19/1966   Cancelled treatment:       Reason Eval/Treat Not Completed: Medical issues which prohibited therapy. Pt ventilated. Will f/u for readiness.    Khadejah Son, [name] Nearing 05/09/2019, 7:39 AM

## 2019-05-09 NOTE — Progress Notes (Signed)
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 Loreta Ave. Patient awake and alert sitting in bed. Follows commands. Moves all 4s. Difficulty with word finding.   Allergies: Hydrocodone-acetaminophen, Hydromorphone, Other, Oxycodone, Penicillins, and Dilaudid [hydromorphone hcl]  Medications: Prior to Admission medications   Medication Sig Start Date End Date Taking? Authorizing Provider  acetaminophen (TYLENOL) 500 MG tablet Take 1,000 mg by mouth every 4 (four) hours as needed for headache (pain).    Yes [provider]  albuterol (VENTOLIN HFA) 108 (90 Base) MCG/ACT inhaler Inhale into the lungs every 4 (four) hours as needed for wheezing or shortness of breath.   Yes [provider]  amiodarone (PACERONE) 200 MG tablet Take 1 tablet (200 mg total) by mouth 3 (three) times daily. Patient taking differently: Take 200 mg by mouth daily.  02/25/19  Yes Georgeanna Lea, MD  dicyclomine (BENTYL) 20 MG tablet Take 20 mg by mouth every 6 (six) hours as needed (abdominal pain).   Yes [provider]  DULoxetine (CYMBALTA) 30 MG capsule Take 30 mg by mouth at bedtime. 05/07/19  Yes [provider]  enoxaparin (LOVENOX) 100 MG/ML injection Inject 100 mg into the skin 2 (two) times daily. 05/07/19  Yes [provider]  furosemide (LASIX) 20 MG tablet Take 20 mg by mouth daily.    Yes [provider]  gabapentin (NEURONTIN) 300 MG capsule Take 300 mg by mouth 3 (three) times daily.    Yes [provider]  ipratropium-albuterol (DUONEB) 0.5-2.5 (3) MG/3ML SOLN Take 3 mLs by nebulization 4 (four) times daily. 04/28/19  Yes [provider]  levothyroxine (SYNTHROID, LEVOTHROID) 50 MCG tablet  Take 50 mcg by mouth at bedtime.    Yes [provider]  omeprazole (PRILOSEC) 20 MG capsule Take 20 mg by mouth daily.  08/17/16  Yes [provider]  rosuvastatin (CRESTOR) 40 MG tablet Take 40 mg by mouth at bedtime.  11/16/17  Yes [provider]  spironolactone (ALDACTONE) 25 MG tablet Take 12.5 mg by mouth daily. 05/07/19  Yes [provider]  tiotropium (SPIRIVA HANDIHALER) 18 MCG inhalation capsule Place 18 mcg into inhaler and inhale daily at 2 PM. 03/12/19  Yes [provider]  topiramate (TOPAMAX) 50 MG tablet Take 50 mg by mouth at bedtime.  11/08/17  Yes [provider]  aspirin 81 MG chewable tablet Chew 1 tablet (81 mg total) by mouth daily. Patient not taking: Reported on 05/08/2019 05/15/12   Angiulli, Mcarthur Rossetti, PA-C  empagliflozin (JARDIANCE) 10 MG TABS tablet Take 10 mg by mouth daily.    [provider]  sacubitril-valsartan (ENTRESTO) 24-26 MG Take 1 tablet by mouth 2 (two) times daily.    [provider]  warfarin (COUMADIN) 5 MG tablet TAKE 1 & 1/2 TO 2 TABLETS BY MOUTH DAILY AS DIRECTED BY COUMADIN CLINIC Patient taking differently: Take 2.5-5 mg by mouth See admin instructions. Take one tablet (5 mg) by mouth 1st night, then take 1/2 tablet (2.5 mg) 2nd night, then repeat 03/30/19   Regan Lemming, MD     Vital Signs: BP 99/75   Pulse 64   Temp 98.7 F (37.1 C) (Axillary)   Resp (!) 21   Ht 5\' 11"  (1.803 m)   Wt 233 lb 11  oz (106 kg)   SpO2 94%   BMI 32.59 kg/m   Physical Exam Vitals and nursing note reviewed.  Constitutional:      General: He is not in acute distress.    Appearance: Normal appearance.  Pulmonary:     Effort: Pulmonary effort is normal. No respiratory distress.  Skin:    General: Skin is warm and dry.     Comments: Right groin incision soft without active bleeding or hematoma.  Neurological:     Mental Status: He is alert.     Comments: Follows commands. Difficulty  with word finding. Moves all 4s. No pronator drift. Distal pulses palpable bilaterally with Doppler.     Imaging: CT Code Stroke CTA Head W/WO contrast  Addendum Date: 05/08/2019   ADDENDUM REPORT: 05/08/2019 11:48 ADDENDUM: Upon further review, there is likely greater stenosis within the proximal left common carotid artery than originally stated. This is on the basis of circumferential plaque or thrombus. Exact quantification of stenosis is difficult due to vessel tortuosity, but likely moderate/severe. Addended findings called by telephone at the time of interpretation on 05/08/2019 at 11:47 am to provider [facility] , who verbally acknowledged these results. Electronically Signed   By: Kellie Simmering DO   On: 05/08/2019 11:48   Result Date: 05/08/2019 CLINICAL DATA:  Stroke, follow-up. EXAM: CT ANGIOGRAPHY HEAD AND NECK CT PERFUSION BRAIN TECHNIQUE: Multidetector CT imaging of the head and neck was performed using the standard protocol during bolus administration of intravenous contrast. Multiplanar CT image reconstructions and MIPs were obtained to evaluate the vascular anatomy. Carotid stenosis measurements (when applicable) are obtained utilizing NASCET criteria, using the distal internal carotid diameter as the denominator. CONTRAST:  Administered contrast not known at this time. 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 The proximal right common carotid artery also appears partially thrombosed with high-grade stenosis. Right carotid system: Clot within the proximal right common carotid artery with high-grade stenosis. The stenosis gradually diminishes within the distal common carotid artery. There  is circumferential hypoattenuation within the distal common which may reflect plaque or eccentric thrombus. Eccentric plaque is also present within the carotid bulb without significant narrowing. The there is abrupt caliber change towards the cephalad aspect of the carotid bulb with the cervical ICA significantly asymmetrically diminutive as compared to the right throughout the remainder of the neck and through the siphon region with sites of high-grade stenosis within the siphon region. Left carotid system: The common carotid artery is patent without significant stenosis (50% or greater). Eccentric plaque or thrombus within the proximal common carotid artery. The ICA is patent without significant stenosis. Mild mixed plaque within the left carotid bulb. Vertebral arteries: Left vertebral artery slightly dominant. The bilateral vertebral arteries are patent throughout the neck without significant stenosis. Skeleton: No acute bony abnormality. Other neck: No soft tissue neck mass or cervical lymphadenopathy. Upper chest: Partially imaged extensive airspace opacities within the left lung apex suspicious for pneumonia. Review of the MIP images confirms the above findings CTA HEAD FINDINGS Anterior circulation: Markedly diminutive intracranial right ICA through the siphon region with sites of high-grade stenosis. Mixed plaque also results in mild-to-moderate narrowing of the supraclinoid right ICA. The M1 right middle cerebral artery is patent without significant proximal  stenosis. Atherosclerotic irregularity of M2 right MCA branches with no proximal branch occlusion identified. The right anterior cerebral artery is patent without proximal branch occlusion or high-grade proximal stenosis. The M1 left middle cerebral artery is patent without significant stenosis. Atherosclerotic irregularity of M2 left MCA branch vessels without proximal branch occlusion identified. Abrupt occlusion of the proximal A2 left anterior  cerebral artery. There appears to be some reconstitution of the distal left ACA. Posterior circulation: The intracranial vertebral arteries are patent without significant stenosis, as is the basilar artery. The bilateral posterior cerebral arteries are patent without proximal branch occlusion or high-grade proximal arterial stenosis. Posterior communicating arteries are hypoplastic or absent bilaterally. Venous sinuses: Within limitations of contrast timing, no convincing thrombus. Anatomic variants: As described Review of the MIP images confirms the above findings These results were called by telephone at the time of interpretation on 05/08/2019 at 11:15 am to provider [facility] , who verbally acknowledged these results. IMPRESSION: IMPRESSION CTA neck: 1. 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 2. 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 Thrombus extends into the proximal right common carotid artery with high-grade stenosis. There is a progressive decrease in this stenosis towards the distal CCA. There is abrupt caliber change of the cervical right ICA just distal to the bulb. New from prior examinations, the cervical ICA is markedly asymmetrically diminutive throughout the neck and through the siphon region. Sites of high-grade stenosis within the siphon region. 3. The left common and cervical internal carotid arteries are patent without significant stenosis. Circumferential plaque or thrombus within the proximal left common carotid artery. 4. Bilateral vertebral arteries patent within the neck without significant stenosis. 5. Extensive airspace disease within the partially imaged left lung apex suspicious for pneumonia. CTA head: 1. The right ICA is markedly diminutive through the siphon region with sites of high-grade stenosis. Mixed plaque also results in mild/moderate stenosis of the supraclinoid right  ICA. 2. Abrupt occlusion of the proximal A2 left anterior cerebral artery. There appears to be some reconstitution of the left ACA distally. 3. No other intracranial large vessel occlusion or proximal high-grade arterial stenosis is identified. Electronically Signed: By: Jackey LogeKyle  Golden DO On: 05/08/2019 11:39   CT Code Stroke CTA Neck W/WO contrast  Addendum Date: 05/08/2019   ADDENDUM REPORT: 05/08/2019 11:48 ADDENDUM: Upon further review, there is likely greater stenosis within the proximal left common carotid artery than originally stated. This is on the basis of circumferential plaque or thrombus. Exact quantification of stenosis is difficult due to vessel tortuosity, but likely moderate/severe. Addended findings called by telephone at the time of interpretation on 05/08/2019 at 11:47 am to [facility] , who verbally acknowledged these results. Electronically Signed   By: Jackey LogeKyle  Golden DO   On: 05/08/2019 11:48   Result Date: 05/08/2019 CLINICAL DATA:  Stroke, follow-up. EXAM: CT ANGIOGRAPHY HEAD AND NECK CT PERFUSION BRAIN TECHNIQUE: Multidetector CT imaging of the head and neck was performed using the standard protocol during bolus administration of intravenous contrast. Multiplanar CT image reconstructions and MIPs were obtained to evaluate the vascular anatomy. Carotid stenosis measurements (when applicable) are obtained utilizing NASCET criteria, using the distal internal carotid diameter as the denominator. CONTRAST:  Administered contrast not known at this time. COMPARISON:  Same day noncontrast CT head, CT angiogram neck 07/08/2012, CT angiogram chest 04/29/2019, CT angiogram chest 10/26/2010 FINDINGS: CTA NECK FINDINGS Aortic arch: Redemonstrated sequela of  previous graft repair of ascending thoracic aorta and aortic arch. 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 The proximal right common carotid artery also appears partially thrombosed with high-grade  stenosis. Right carotid system: Clot within the proximal right common carotid artery with high-grade stenosis. The stenosis gradually diminishes within the distal common carotid artery. There is circumferential hypoattenuation within the distal common which may reflect plaque or eccentric thrombus. Eccentric plaque is also present within the carotid bulb without significant narrowing. The there is abrupt caliber change towards the cephalad aspect of the carotid bulb with the cervical ICA significantly asymmetrically diminutive as compared to the right throughout the remainder of the neck and through the siphon region with sites of high-grade stenosis within the siphon region. Left carotid system: The common carotid artery is patent without significant stenosis (50% or greater). Eccentric plaque or thrombus within the proximal common carotid artery. The ICA is patent without significant stenosis. Mild mixed plaque within the left carotid bulb. Vertebral arteries: Left vertebral artery slightly dominant. The bilateral vertebral arteries are patent throughout the neck without significant stenosis. Skeleton: No acute bony abnormality. Other neck: No soft tissue neck mass or cervical lymphadenopathy. Upper chest: Partially imaged extensive airspace opacities within the left lung apex suspicious for pneumonia. Review of the MIP images confirms the above findings CTA HEAD FINDINGS Anterior circulation: Markedly diminutive intracranial right ICA through the siphon region with sites of high-grade stenosis. Mixed plaque also results in mild-to-moderate narrowing of the supraclinoid right ICA. The M1 right middle cerebral artery is patent without significant proximal stenosis. Atherosclerotic irregularity of M2 right MCA branches with no proximal branch occlusion identified. The right anterior cerebral artery is patent without proximal branch occlusion or high-grade proximal stenosis. The M1 left middle cerebral artery is  patent without significant stenosis. Atherosclerotic irregularity of M2 left MCA branch vessels without proximal branch occlusion identified. Abrupt occlusion of the proximal A2 left anterior cerebral artery. There appears to be some reconstitution of the distal left ACA. Posterior circulation: The intracranial vertebral arteries are patent without significant stenosis, as is the basilar artery. The bilateral posterior cerebral arteries are patent without proximal branch occlusion or high-grade proximal arterial stenosis. Posterior communicating arteries are hypoplastic or absent bilaterally. Venous sinuses: Within limitations of contrast timing, no convincing thrombus. Anatomic variants: As described Review of the MIP images confirms the above findings These results were called by telephone at the time of interpretation on 05/08/2019 at 11:15 am to provider [facility] , who verbally acknowledged these results. IMPRESSION: IMPRESSION CTA neck: 1. 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 2. 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 Thrombus extends into the proximal right common carotid artery with high-grade stenosis. There is a progressive decrease in this stenosis towards the distal CCA. There is abrupt caliber change of the cervical right ICA just distal to the bulb. New from prior examinations, the cervical ICA is markedly asymmetrically diminutive throughout the neck and through the siphon region. Sites of high-grade stenosis within the siphon region. 3. The left common and cervical internal carotid arteries are patent without significant stenosis. Circumferential plaque or thrombus within the proximal left common carotid artery. 4. Bilateral vertebral arteries patent within the neck without significant stenosis. 5. Extensive airspace disease within the partially imaged left lung apex suspicious for  pneumonia. CTA head: 1. The right ICA is markedly diminutive through the siphon  region with sites of high-grade stenosis. Mixed plaque also results in mild/moderate stenosis of the supraclinoid right ICA. 2. Abrupt occlusion of the proximal A2 left anterior cerebral artery. There appears to be some reconstitution of the left ACA distally. 3. No other intracranial large vessel occlusion or proximal high-grade arterial stenosis is identified. Electronically Signed: By: Jackey Loge DO On: 05/08/2019 11:39   CT Code Stroke Cerebral Perfusion with contrast  Result Date: 05/08/2019 CLINICAL DATA:  Stroke, follow-up. EXAM: CT PERFUSION BRAIN TECHNIQUE: Multiphase CT imaging of the brain was performed following IV bolus contrast injection. Subsequent parametric perfusion maps were calculated using RAPID software. CONTRAST:  OMNIPAQUE IOHEXOL 350 MG/ML SOLN, 95mL OMNIPAQUE IOHEXOL 350 MG/ML SOLN COMPARISON:  Same day noncontrast head CT and CT angiogram head/neck FINDINGS: CT Brain Perfusion Findings: CBF (<30%) Volume: 3mL Perfusion (Tmax>6.0s) volume: 125 mL (predominantly involving the left ACA and ACA/MCA watershed territory, as well as the right ACA/MCA watershed territory). Mismatch Volume: 125 mL Infarct Core: None identified by the perfusion software. Infarction Location: None identified by the perfusion software. These results were called by telephone at the time of interpretation on 05/08/2019 at 11:15 a.m. pm to provider [facility] , who verbally acknowledged these results. IMPRESSION: 125 mL region of hypoperfused parenchyma predominantly within the left ACA and ACA/MCA watershed territory, as well as right ACA/MCA watershed territory. The perfusion software does not detect any core infarct. However, there were clear changes of infarction within the corpus callosum and left pericallosal gyrus on noncontrast head CT performed earlier the same day. Electronically Signed   By: Jackey Loge DO   On:  05/08/2019 12:43   DG CHEST PORT 1 VIEW  Result Date: 05/09/2019 CLINICAL DATA:  Endotracheal tube present. EXAM: PORTABLE CHEST 1 VIEW COMPARISON:  May 08, 2019. FINDINGS: Stable cardiomegaly. Endotracheal and nasogastric tubes are unchanged in position. Left-sided pacemaker is unchanged. Status post cardiac valve repair. No pneumothorax or significant pleural effusion is noted. Right lung is clear. Stable diffuse reticular densities are noted in the left lung concerning for inflammation, atelectasis or possibly edema. Bony thorax is unremarkable. IMPRESSION: Stable support apparatus. Stable diffuse reticular densities are noted in left lung concerning for inflammation, atelectasis or possibly edema. Electronically Signed   By: Lupita Raider M.D.   On: 05/09/2019 08:26   DG Chest Port 1 View  Result Date: 05/08/2019 CLINICAL DATA:  54 yo male with acute onset of right sided symptoms this morning, presents to the ED from home. DC yesterday from [facility]. EXAM: PORTABLE CHEST 1 VIEW COMPARISON:  Chest radiograph 05/02/2019 FINDINGS: Stable cardiomediastinal contours with enlarged heart size. Interval intubation with endotracheal tube tip terminating between the thoracic inlet and carina. Nasogastric tube courses below the diaphragm with tip out of field of view. Left chest ICD in place. There are diffuse interstitial opacities throughout the left lung which are improved compared to prior radiograph. The right remains well aerated. No pneumothorax or large pleural effusion. IMPRESSION: 1. Interval intubation with endotracheal tube tip terminating between the thoracic inlet and carina. Nasogastric tube courses below the diaphragm with tip out of field of view. 2. Diffuse interstitial opacities throughout the left lung, improved compared to prior radiograph. Electronically Signed   By: Emmaline Kluver M.D.   On: 05/08/2019 17:33   ECHOCARDIOGRAM COMPLETE  Result Date: 05/09/2019   ECHOCARDIOGRAM  REPORT   Patient Name:   Jon [name] Date of Exam: 05/09/2019 Medical Rec #:  [id number]  Height:       71.0 in Accession #:    [id number]          Weight:       233.7 lb Date of Birth:  1965/10/11           BSA:          2.25 m Patient Age:    53 years            BP:           95/82 mmHg Patient Gender: M                   HR:           63 bpm. Exam Location:  Inpatient Procedure: 2D Echo Indications:    stroke 434.91  History:        Patient has prior history of Echocardiogram examinations, most                 recent 05/01/2018. Aortic dissection; Risk Factors:Current                 Smoker. Aortic Valve: A mechanical aortic valve prosthesis valve                 is present in the aortic position.  Sonographer:    Delcie Roch Referring Phys: [id number] ASHISH ARORA IMPRESSIONS  1. Left ventricular ejection fraction, by visual estimation, is 25 to 30%. The left ventricle has severely decreased function. There is no left ventricular hypertrophy.  2. Elevated left ventricular end-diastolic pressure.  3. Left ventricular diastolic parameters are consistent with Grade II diastolic dysfunction (pseudonormalization).  4. The left ventricle demonstrates global hypokinesis.  5. Global right ventricle has normal systolic function.The right ventricular size is normal. No increase in right ventricular wall thickness.  6. Left atrial size was normal.  7. Right atrial size was normal.  8. The mitral valve is normal in structure. Mild mitral valve regurgitation. No evidence of mitral stenosis.  9. The tricuspid valve is normal in structure. 10. Aortic valve regurgitation is not visualized. Mild aortic valve stenosis. 11. Mechanical prosthesis in the aortic valve position. 12. The pulmonic valve was normal in structure. Pulmonic valve regurgitation is not visualized. 13. Normal pulmonary artery systolic pressure. 14. The inferior vena cava is normal in size with greater than 50% respiratory variability,  suggesting right atrial pressure of 3 mmHg. 15. When compared to the prior study from 05/01/2018 LVEF has decreased from 55-60% to 25-30% with diffuse hypokinesis. Transaortic gradients across the mechanical aortic valve are unchanged. FINDINGS  Left Ventricle: Left ventricular ejection fraction, by visual estimation, is 25 to 30%. The left ventricle has severely decreased function. The left ventricle demonstrates global hypokinesis. There is no left ventricular hypertrophy. Left ventricular diastolic parameters are consistent with Grade II diastolic dysfunction (pseudonormalization). Elevated left ventricular end-diastolic pressure. Right Ventricle: The right ventricular size is normal. No increase in right ventricular wall thickness. Global RV systolic function is has normal systolic function. The tricuspid regurgitant velocity is 2.36 m/s, and with an assumed right atrial pressure  of 3 mmHg, the estimated right ventricular systolic pressure is normal at 25.3 mmHg. Left Atrium: Left atrial size was normal in size. Right Atrium: Right atrial size was normal in size Pericardium: There is no evidence of pericardial effusion. Mitral Valve: The mitral valve is normal in structure. Mild mitral valve regurgitation. No evidence of mitral valve stenosis by observation. Tricuspid Valve: The tricuspid valve  is normal in structure. Tricuspid valve regurgitation is mild. Aortic Valve: The aortic valve has been repaired/replaced. Aortic valve regurgitation is not visualized. Mild aortic stenosis is present. Aortic valve mean gradient measures 18.5 mmHg. Aortic valve peak gradient measures 34.5 mmHg. Aortic valve area, by VTI measures 1.81 cm. Mechanical aortic valve prosthesis valve is present in the aortic position. Pulmonic Valve: The pulmonic valve was normal in structure. Pulmonic valve regurgitation is not visualized. Pulmonic regurgitation is not visualized. Aorta: The aortic root, ascending aorta and aortic arch are  all structurally normal, with no evidence of dilitation or obstruction. Venous: The inferior vena cava is normal in size with greater than 50% respiratory variability, suggesting right atrial pressure of 3 mmHg. IAS/Shunts: No atrial level shunt detected by color flow Doppler. There is no evidence of a patent foramen ovale. No ventricular septal defect is seen or detected. There is no evidence of an atrial septal defect.  LEFT VENTRICLE PLAX 2D LVIDd:         6.80 cm  Diastology LVIDs:         4.90 cm  LV e' lateral:   6.74 cm/s LV PW:         1.50 cm  LV E/e' lateral: 9.8 LV IVS:        1.10 cm  LV e' medial:    6.20 cm/s LVOT diam:     2.30 cm  LV E/e' medial:  10.7 LV SV:         126 ml LV SV Index:   54.12 LVOT Area:     4.15 cm  RIGHT VENTRICLE RV S prime:     10.10 cm/s TAPSE (M-mode): 1.3 cm LEFT ATRIUM              Index       RIGHT ATRIUM           Index LA diam:        4.90 cm  2.18 cm/m  RA Area:     19.10 cm LA Vol (A2C):   104.0 ml 46.17 ml/m RA Volume:   50.30 ml  22.33 ml/m LA Vol (A4C):   88.6 ml  39.33 ml/m LA Biplane Vol: 97.9 ml  43.46 ml/m  AORTIC VALVE AV Area (Vmax):    1.86 cm AV Area (Vmean):   1.69 cm AV Area (VTI):     1.81 cm AV Vmax:           293.50 cm/s AV Vmean:          196.000 cm/s AV VTI:            0.535 m AV Peak Grad:      34.5 mmHg AV Mean Grad:      18.5 mmHg LVOT Vmax:         131.50 cm/s LVOT Vmean:        79.750 cm/s LVOT VTI:          0.233 m LVOT/AV VTI ratio: 0.44  AORTA Ao Root diam: 3.80 cm MITRAL VALVE                        TRICUSPID VALVE MV Area (PHT): 3.48 cm             TR Peak grad:   22.3 mmHg MV PHT:        63.22 msec           TR Vmax:  236.00 cm/s MV Decel Time: 218 msec MV E velocity: 66.10 cm/s 103 cm/s  SHUNTS MV A velocity: 47.10 cm/s 70.3 cm/s Systemic VTI:  0.23 m MV E/A ratio:  1.40       1.5       Systemic Diam: 2.30 cm  Tobias AlexanderKatarina Nelson MD Electronically signed by Tobias AlexanderKatarina Nelson MD Signature Date/Time: 05/09/2019/1:28:37 PM    Final      CT Angio Chest/Abd/Pel for Dissection W and/or W/WO  Result Date: 05/08/2019 CLINICAL DATA:  Possible dissection. EXAM: CT ANGIOGRAPHY CHEST, ABDOMEN AND PELVIS TECHNIQUE: Multidetector CT imaging through the chest, abdomen and pelvis was performed using the standard protocol during bolus administration of intravenous contrast. Multiplanar reconstructed images and MIPs were obtained and reviewed to evaluate the vascular anatomy. CONTRAST:  50mL OMNIPAQUE IOHEXOL 350 MG/ML SOLN COMPARISON:  July 08, 2012.  April 30, 2019. FINDINGS: CTA CHEST FINDINGS Cardiovascular: Status post surgical repair of ascending thoracic aortic aneurysm as well as aortic valve replacement. No aneurysm or dissection is noted currently. Left common carotid and subclavian arteries appear to be patent. 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 Mild cardiomegaly is noted. No pericardial effusion is noted. Left-sided pacemaker is unchanged in position. Mediastinum/Nodes: Thyroid gland and esophagus are unremarkable. Stable aortopulmonary window and right paratracheal adenopathy is noted which most likely is reactive and benign. Lungs/Pleura: No pneumothorax or pleural effusion is noted. Diffuse left lung opacity is noted concerning for pneumonia or scarring. Mild emphysematous disease is noted. Mild right posterior basilar subsegmental atelectasis is noted. Musculoskeletal: No chest wall abnormality. No acute or significant osseous findings. Review of the MIP images confirms the above findings. CTA ABDOMEN AND PELVIS FINDINGS VASCULAR Aorta: There is no evidence of aortic dissection or aneurysm. Mild atheromatous disease is noted with probable focal soft plaque seen involving the infrarenal abdominal aorta. Celiac: Patent without evidence of aneurysm, dissection, vasculitis or significant stenosis. SMA: Patent without evidence  of aneurysm, dissection, vasculitis or significant stenosis. Renals: Both renal arteries are patent without evidence of aneurysm, dissection, vasculitis, fibromuscular dysplasia or significant stenosis. IMA: Patent without evidence of aneurysm, dissection, vasculitis or significant stenosis. Inflow: Patent without evidence of aneurysm, dissection, vasculitis or significant stenosis. Veins: No obvious venous abnormality within the limitations of this arterial phase study. Review of the MIP images confirms the above findings. NON-VASCULAR Hepatobiliary: No focal liver abnormality is seen. Status post cholecystectomy. No biliary dilatation. Pancreas: Unremarkable. No pancreatic ductal dilatation or surrounding inflammatory changes. Spleen: Normal in size without focal abnormality. Adrenals/Urinary Tract: Adrenal glands are unremarkable. Kidneys are normal, without renal calculi, focal lesion, or hydronephrosis. Bladder is unremarkable. Stomach/Bowel: Stomach is within normal limits. Appendix appears normal. No evidence of bowel wall thickening, distention, or inflammatory changes. Lymphatic: No significant adenopathy is noted. Reproductive: Prostate is unremarkable. Other: Bilateral fat containing inguinal hernias are noted, right greater than left. No ascites is noted. Musculoskeletal: No acute or significant osseous findings. Review of the MIP images confirms the above findings. IMPRESSION: Status post surgical repair of ascending thoracic aortic aneurysm as well as aortic valve repair. No definite evidence of acute dissection or aneurysm is seen involving the thoracic or abdominal aorta. 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 Stable diffuse lung opacity is noted concerning for pneumonia or scarring. Bilateral fat containing inguinal hernias  are noted, right greater than left. Aortic Atherosclerosis (ICD10-I70.0) and Emphysema (ICD10-J43.9).1  Electronically Signed   By: Lupita Raider M.D.   On: 05/08/2019 12:50   CT HEAD CODE STROKE WO CONTRAST  Result Date: 05/08/2019 CLINICAL DATA:  Code stroke. Neuro deficit, acute, stroke suspected. EXAM: CT HEAD WITHOUT CONTRAST TECHNIQUE: Contiguous axial images were obtained from the base of the skull through the vertex without intravenous contrast. COMPARISON:  Head CT 10/26/2018 FINDINGS: Brain: Abnormal hypodensity within the body of the corpus callosum eccentric to the left also possibly involving portions left pericallosal gyrus, measuring 2.1 x 1.1 cm in transaxial dimensions (series 3, image 19) (series 6, image 35). No evidence of acute intracranial hemorrhage. No demarcated cortical infarction. No evidence of intracranial mass. No midline shift or extra-axial fluid collection. Cerebral volume is normal for age. Vascular: No hyperdense vessel. Skull: Normal. Negative for fracture or focal lesion. Sinuses/Orbits: Visualized orbits demonstrate no acute abnormality. These results were called by telephone at the time of interpretation on 05/08/2019 at 11:01 am to provider Dr. Wilford Corner, who verbally acknowledged these results. IMPRESSION: 2.1 x 1.1 cm focus of abnormal hypodensity within the left aspect of the callosal body and possibly also involving the pericallosal gyrus on the left. Findings are new from prior examination 10/26/2018 and may reflect an acute infarct or other lesion. Consider brain MRI for further evaluation. Electronically Signed   By: Jackey Loge DO   On: 05/08/2019 11:01    Labs:  CBC: Recent Labs    03/04/19 1518 05/08/19 1041 05/08/19 1047 05/08/19 1810 05/09/19 0504  WBC 7.8 16.6*  --   --  18.1*  HGB 13.8 14.7 15.6 12.6* 12.7*  HCT 42.7 45.1 46.0 37.0* 39.1  PLT 252 357  --   --  370    COAGS: Recent Labs    03/04/19 1518 03/30/19 0816 05/08/19 1041 05/09/19 0504  INR 2.2* 2.2* 1.4* 1.5*  APTT  --   --  25  --     BMP: Recent Labs    03/30/19 0819  03/30/19 0819 05/08/19 1041 05/08/19 1047 05/08/19 1810 05/08/19 2357 05/09/19 0112  NA 140   < > 136 136 136 137 138  K 5.0  --  4.3 4.1 4.5 3.9 3.7  CL 101  --  102 102  --  107 106  CO2 22  --  23  --   --  22 17*  GLUCOSE 113*  --  202* 192*  --  145* 138*  BUN 13  --  19 18  --  11 10  CALCIUM 9.4  --  8.5*  --   --  8.0* 7.8*  CREATININE 1.47*  --  1.02 0.90  --  0.82 0.78  GFRNONAA 54*  --  >60  --   --  >60 >60  GFRAA 62  --  >60  --   --  >60 >60   < > = values in this interval not displayed.    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 Loreta Ave. Patient's condition stable- with difficulty with word finding, can spontaneously move all extremities and follows commands. Right groin incision c/d/i. Further plans  per neurology/CCM/TCTS- appreciate and agree with management. Please call NIR with questions/concerns.   Electronically Signed: Elwin Mocha, PA-C 05/09/2019, 1:37 PM   I spent a total of 25 Minutes at the the patient's bedside AND on the patient's hospital floor or unit, greater than 50% of which was counseling/coordinating care for CVA s/p attempted thrombectomy.

## 2019-05-09 NOTE — Procedures (Signed)
Extubation Procedure Note  Patient Details:   Name: Jon [name] DOB: 06-16-1965 MRN: [id number]   Airway Documentation:    Vent end date: 05/09/19 Vent end time: 0913   Evaluation  O2 sats: stable throughout Complications: No apparent complications Patient did tolerate procedure well. Bilateral Breath Sounds: Clear  Patient able to speak Yes  Peggye Form 05/09/2019, 9:14 AM

## 2019-05-09 NOTE — Progress Notes (Signed)
  Echocardiogram 2D Echocardiogram has been performed.  Jon [name] 05/09/2019, 10:24 AM

## 2019-05-09 NOTE — Progress Notes (Signed)
Rehab Admissions Coordinator Note:  Per PT/OT recommendation, patient was screened by Stephania Fragmin for appropriateness for an Inpatient Acute Rehab Consult.  At this time, we are recommending Inpatient Rehab consult.  I will place a consult order so that we may evaluate the pt on Monday.   Stephania Fragmin 05/09/2019, 4:21 PM  I can be reached at [phone].

## 2019-05-09 NOTE — Progress Notes (Signed)
OT Cancellation Note  Patient Details Name: Jon [name] MRN: [id number] DOB: [birth year]   Cancelled Treatment:    Reason Eval/Treat Not Completed: Patient not medically ready(Pt intubated and possible extubation today. OT to follow.)   Flora Lipps OTR/L Acute Rehabilitation Services Pager: [phone] Office: [phone]   Curtis Cain C 05/09/2019, 8:50 AM

## 2019-05-09 NOTE — Progress Notes (Signed)
PT Cancellation Note  Patient Details Name: Sky Borboa MRN: [id number] DOB: 16-Oct-1965   Cancelled Treatment:    Reason Eval/Treat Not Completed: Medical issues which prohibited therapy(pt currently intubated with possible pending extubation and await extubation per RN)   Enedina Finner Milayna Rotenberg 05/09/2019, 8:49 AM  Merryl Hacker, PT Acute Rehabilitation Services Pager: [phone] Office: [phone]

## 2019-05-09 NOTE — Evaluation (Signed)
Occupational Therapy Evaluation Patient Details Name: Jon [name] MRN: [id number] DOB: [birth year] Today's Date: 05/09/2019    History of Present Illness 54 yo admitted with right weakness and left gaze with aphasia with acute left ACA anterior territory CVA s/p revascularization. VDRF 1/1-1/2. PMhx: dysarthria, aortic dissection s/p repair, AVR, GERD, CVA x 3   Clinical Impression   Pt PTA: Pt reports that he was performing own ADL and mobility with AD, but no physical assist. Pt currently with expressive difficulties, but does well with yes or no questions. Pt taked multiple attempts to get intelligible speech out. Appears to comprehend, WFLs and followed all commands. Pt limited by poor strength in R side, poor mobility and decreased activity tolerance. Pt modA overall for ADL. Pt minA +2 for sit to stand, but pt requiring maximal cueing to weight shift to L LE to move RLE and physical assist for weakness on R side. BP taken in A line and appeared to have MAP of 143. Pt's BP was too variant at EOB. Manual BP taken by RW 148/92. Pt would benefit from continued OT skilled services for ADL, mobility and safety in CIR setting. OT following.     Follow Up Recommendations  CIR    Equipment Recommendations  Other (comment)(to be determined)    Recommendations for Other Services Rehab consult     Precautions / Restrictions Precautions Precautions: Fall Precaution Comments: SBP 140-180 Restrictions Weight Bearing Restrictions: No      Mobility Bed Mobility Overal bed mobility: Needs Assistance Bed Mobility: Supine to Sit   Sidelying to sit: Min assist Supine to sit: HOB elevated;Min guard     General bed mobility comments: HOb 30 degrees with increased time, guarding for lines  Transfers Overall transfer level: Needs assistance Equipment used: 2 person hand held assist Transfers: Sit to/from Stand Sit to Stand: Min assist Stand pivot transfers: Mod assist;+2 physical  assistance;+2 safety/equipment;From elevated surface       General transfer comment: pt with cues for hand placement with +2 for lines to stand from bed    Balance Overall balance assessment: Needs assistance Sitting-balance support: Bilateral upper extremity supported Sitting balance-Leahy Scale: Good Sitting balance - Comments: pt able to sit EOb for increased time to get bP reading without LOB   Standing balance support: Bilateral upper extremity supported Standing balance-Leahy Scale: Poor Standing balance comment: Pt pushing arms out to hold onto IV pole, but grip strength is poor in hands, so pt required HHA.                           ADL either performed or assessed with clinical judgement   ADL Overall ADL's : Needs assistance/impaired Eating/Feeding: Modified independent;Sitting   Grooming: Min guard;Sitting   Upper Body Bathing: Minimal assistance;Sitting   Lower Body Bathing: Moderate assistance;Cueing for safety;Sitting/lateral leans;Sit to/from stand   Upper Body Dressing : Minimal assistance;Sitting   Lower Body Dressing: Moderate assistance;Sitting/lateral leans;Sit to/from stand   Toilet Transfer: Moderate assistance;+2 for physical assistance;+2 for safety/equipment;Cueing for safety;Cueing for sequencing;Ambulation   Toileting- Clothing Manipulation and Hygiene: Moderate assistance;+2 for physical assistance;+2 for safety/equipment;Sitting/lateral lean;Sit to/from stand       Functional mobility during ADLs: Minimal assistance;Moderate assistance;+2 for physical assistance;+2 for safety/equipment;Cueing for safety;Cueing for sequencing General ADL Comments: Pt limited by poor strength in R side, poor mobility and decreased activity tolerance     Vision Baseline Vision/History: No visual deficits Patient Visual Report: No change from  baseline Vision Assessment?: Yes;No apparent visual deficits Eye Alignment: Within Functional Limits Ocular  Range of Motion: Within Functional Limits Additional Comments: Pt able to state "quarter after 11" when asked what time it was. (correct time given).     Perception     Praxis      Pertinent Vitals/Pain Pain Assessment: No/denies pain     Hand Dominance Right   Extremity/Trunk Assessment Upper Extremity Assessment Upper Extremity Assessment: Defer to OT evaluation RUE Deficits / Details: Shoulder 3+/5 MM grade, elbow through hand with 4/5 MM grade, AROM, WFLs. Pt with decreased grip strength RUE Sensation: (inconsistent with sensation on R side) RUE Coordination: decreased fine motor;decreased gross motor   Lower Extremity Assessment Lower Extremity Assessment: RLE deficits/detail RLE Deficits / Details: hip flexion 4/5, knee flexion and extension 5/5.   Cervical / Trunk Assessment Cervical / Trunk Assessment: Normal   Communication Communication Communication: Expressive difficulties   Cognition Arousal/Alertness: Awake/alert Behavior During Therapy: Flat affect Overall Cognitive Status: Difficult to assess                                 General Comments: pt with expressive aphasia answering yes/no questions appropriately and attempting to expand upon statements but difficult to understand   General Comments  BP taken in A line and appeared tohave MAP of 143. Pt's BP was too variant at EOB: manual BP taken 148/92    Exercises     Shoulder Instructions      Home Living Family/patient expects to be discharged to:: Private residence Living Arrangements: Parent Available Help at Discharge: Family;Available 24 hours/day Type of Home: House Home Access: Stairs to enter [employer] of Steps: 3-5 (back), 3 front   Home Layout: One level     Bathroom Shower/Tub: Chief Strategy Officer: Standard     Home Equipment: Shower seat;Bedside commode;Walker - 2 wheels;Cane - single point;Wheelchair - manual   Additional Comments: Info  from pt s/p extubation      Prior Functioning/Environment Level of Independence: Independent with assistive device(s)        Comments: pt reports that he lives with his mother, but was independent with ADL and mobility with an AD usually        OT Problem List: Decreased strength;Decreased activity tolerance;Impaired balance (sitting and/or standing);Decreased cognition;Decreased coordination;Decreased safety awareness      OT Treatment/Interventions: Self-care/ADL training;Therapeutic exercise;Neuromuscular education;Energy conservation;DME and/or AE instruction;Therapeutic activities;Visual/perceptual remediation/compensation;Patient/family education;Balance training    OT Goals(Current goals can be found in the care plan section) Acute Rehab OT Goals Patient Stated Goal: race cars OT Goal Formulation: With patient Time For Goal Achievement: 05/23/19 Potential to Achieve Goals: Good ADL Goals Pt Will Perform Grooming: with min guard assist;standing Pt Will Perform Upper Body Dressing: with set-up;sitting Pt Will Perform Lower Body Dressing: with set-up;sitting/lateral leans Pt Will Transfer to Toilet: with min guard assist;ambulating;regular height toilet Pt Will Perform Toileting - Clothing Manipulation and hygiene: with min assist;sitting/lateral leans;sit to/from stand Pt/caregiver will Perform Home Exercise Program: Increased strength;Right Upper extremity;With written HEP provided  OT Frequency: Min 2X/week   Barriers to D/C:            Co-evaluation   Reason for Co-Treatment: For patient/therapist safety;Complexity of the patient's impairments (multi-system involvement) PT goals addressed during session: Mobility/safety with mobility;Balance        AM-PAC OT "6 Clicks" Daily Activity     Outcome Measure Help  from another person eating meals?: A Little Help from another person taking care of personal grooming?: A Lot Help from another person toileting, which  includes using toliet, bedpan, or urinal?: A Lot Help from another person bathing (including washing, rinsing, drying)?: A Lot Help from another person to put on and taking off regular upper body clothing?: A Little Help from another person to put on and taking off regular lower body clothing?: A Lot 6 Click Score: 14   End of Session Equipment Utilized During Treatment: Gait belt Nurse Communication: Mobility status  Activity Tolerance: Patient tolerated treatment well Patient left: in chair;with call bell/phone within reach;with chair alarm set  OT Visit Diagnosis: Unsteadiness on feet (R26.81);Muscle weakness (generalized) (M62.81);Hemiplegia and hemiparesis;Cognitive communication deficit (R41.841) Symptoms and signs involving cognitive functions: Other cerebrovascular disease Hemiplegia - Right/Left: Right Hemiplegia - dominant/non-dominant: Dominant Hemiplegia - caused by: Unspecified                Time: 5974-1638 OT Time Calculation (min): 47 min Charges:  OT General Charges $OT Visit: 1 Visit OT Evaluation $OT Eval Moderate Complexity: [place] OTR/L Acute Rehabilitation Services Pager: [phone] Office: [place] C 05/09/2019, 1:24 PM

## 2019-05-09 NOTE — Progress Notes (Signed)
STROKE TEAM PROGRESS NOTE   HISTORY OF PRESENT ILLNESS (per record) Jon [name] is an 54 y.o. male  with past medical history of a prior stroke with residual dysarthria, type aortic dissection in 2014 with repair that was complicated and involved a brachiocephalic graft to the left carotid as the dissection extended into the brachiocephalic and left carotid, carotid, aortic stenosis status post mechanical valve, presented to the ED as a code stroke for sudden onset of leftward gaze and right-sided flaccid paralysis. It was very difficult to reach his family-number listed for the mother rang busy multiple times.  Finally was able to reach the mother to get history. Per EMS and mother Jon [name] patient woke up this morning at 0900. He was in his usual state of health, he ate breakfast and they held a conversation. Patient went to the bathroom and had a sudden onset of aphasia, right side weakness. He slumped over but did not fall or hit his head. They assisted patient to the floor. Patient was recently discharged 05/07/19 from [facility]. He was discharged on lovenox shots twice per day d/t INR non therapeutic. According to his mother she had not been giving him the coumadin just the lovenox, but he did not receive lovenox this morning. Last dose lovenox  was 05/07/19 PM.  Per mother patient had been using a walker to ambulate since his hospitalization 1 month ago. Patient on 2L [place] 02 @ baseline.   Attending addendum: Called by Dr. Loreta Ave who spoke with the patient's mother who had arrived to the hospital.  She reports that he had been admitted to the [facility] for weakness of the right lower extremity but was treated for mostly pneumonia and reported that he been having trouble using the right leg and walking since Christmas. The acute changes morning was confirmed but history was very inconsistent from what Dr. Loreta Ave received and what was provided to [place] over the phone. ED  course:  CTH: no hemorrhage,  BP:129/76 BG: 192 CTA: occlussion of the proximal A2 left anteriror cerebral arterythrombus in right innominate artery that extends into the right common carotid artery; CTP: core: 125 Date last known well: 05/07/2018 Time last known well: 0900 tPA Given:no, d/t on  lovenox ( anti-coagulation dose) Modified Rankin: Rankin Score=1 NIHSS:15    INTERVAL HISTORY His nurse is at bedside. He is moving all extremities. Doing exceptionally well for the circumstances. Very complicated patient, embolic strokes, went to IR x 4 attempts but today breathing over the vent, attempting to pull out vent.He is on levo, has an a-line. Intubated currently.     OBJECTIVE Vitals:   05/09/19 1015 05/09/19 1030 05/09/19 1045 05/09/19 1245  BP: 102/82 95/77 93/78  (!) 148/92  Pulse: 62 (!) 59 61   Resp: (!) 22 (!) 24 (!) 32   Temp:      TempSrc:      SpO2: 91% 95% 96% 95%  Weight:      Height:        CBC:  Recent Labs  Lab 05/08/19 1041 05/08/19 1810 05/09/19 0504  WBC 16.6*  --  18.1*  NEUTROABS 14.2*  --  14.8*  HGB 14.7 12.6* 12.7*  HCT 45.1 37.0* 39.1  MCV 89.1  --  89.9  PLT 357  --  370    Basic Metabolic Panel:  Recent Labs  Lab 05/08/19 2357 05/09/19 0112  NA 137 138  K 3.9 3.7  CL 107 106  CO2 22 17*  GLUCOSE  145* 138*  BUN 11 10  CREATININE 0.82 0.78  CALCIUM 8.0* 7.8*  MG  --  2.1  PHOS  --  3.5    Lipid Panel:     Component Value Date/Time   CHOL 132 05/09/2019 0504   TRIG 157 (H) 05/09/2019 0504   HDL 34 (L) 05/09/2019 0504   CHOLHDL 3.9 05/09/2019 0504   VLDL 31 05/09/2019 0504   LDLCALC 67 05/09/2019 0504   HgbA1c:  Lab Results  Component Value Date   HGBA1C 6.9 (H) 05/09/2019   Urine Drug Screen: No results found for: LABOPIA, COCAINSCRNUR, LABBENZ, AMPHETMU, THCU, LABBARB  Alcohol Level No results found for: ETH  IMAGING  CT Code Stroke CTA Head W/WO contrast 05/08/2019   ADDENDUM: Upon further review, there is  likely greater stenosis within the proximal left common carotid artery than originally stated. This is on the basis of circumferential plaque or thrombus. Exact quantification of stenosis is difficult due to vessel tortuosity, but likely moderate/severe.  05/08/2019 IMPRESSION   CTA neck:  1. 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  2. 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 Thrombus extends into the proximal right common carotid artery with high-grade stenosis. There is a progressive decrease in this stenosis towards the distal CCA. There is abrupt caliber change of the cervical right ICA just distal to the bulb. New from prior examinations, the cervical ICA is markedly asymmetrically diminutive throughout the neck and through the siphon region. Sites of high-grade stenosis within the siphon region.  3. The left common and cervical internal carotid arteries are patent without significant stenosis. Circumferential plaque or thrombus within the proximal left common carotid artery.  4. Bilateral vertebral arteries patent within the neck without significant stenosis.  5. Extensive airspace disease within the partially imaged left lung apex suspicious for pneumonia.   CTA head:  1. The right ICA is markedly diminutive through the siphon region with sites of high-grade stenosis. Mixed plaque also results in mild/moderate stenosis of the supraclinoid right ICA.  2. Abrupt occlusion of the proximal A2 left anterior cerebral artery. There appears to be some reconstitution of the left ACA distally.  3. No other intracranial large vessel occlusion or proximal high-grade arterial stenosis is identified.   CT Code Stroke Cerebral Perfusion with contrast 05/08/2019 IMPRESSION:  125 mL region of hypoperfused parenchyma predominantly within the left ACA and ACA/MCA watershed territory, as well as right  ACA/MCA watershed territory. The perfusion software does not detect any core infarct. However, there were clear changes of infarction within the corpus callosum and left pericallosal gyrus on noncontrast head CT performed earlier the same day.   DG Chest Port 1 View 05/08/2019 IMPRESSION:  1. Interval intubation with endotracheal tube tip terminating between the thoracic inlet and carina. Nasogastric tube courses below the diaphragm with tip out of field of view.  2. Diffuse interstitial opacities throughout the left lung, improved compared to prior radiograph.   CT Angio Chest/Abd/Pel for Dissection W and/or W/WO 05/08/2019 IMPRESSION:  Status post surgical repair of ascending thoracic aortic aneurysm as well as aortic valve repair. No definite evidence of acute dissection or aneurysm is seen involving the thoracic or abdominal aorta. 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 Stable diffuse lung opacity is noted concerning for pneumonia or scarring. Bilateral fat containing inguinal hernias are noted,  right greater than left. Aortic Atherosclerosis (ICD10-I70.0) and Emphysema (ICD10-J43.9).  CT HEAD CODE STROKE WO CONTRAST 05/08/2019 IMPRESSION:  2.1 x 1.1 cm focus of abnormal hypodensity within the left aspect of the callosal body and possibly also involving the pericallosal gyrus on the left. Findings are new from prior examination 10/26/2018 and may reflect an acute infarct or other lesion. Consider brain MRI for further evaluation. 021 11:01   CT Head WO Contrast - pending  IR Thrombectomy Attempt 05/08/19  Left A2 occlusion, remained occluded at the conclusion  CT shows no SAH at conclusion.  Staining of the infarction of left ACA territory  Non-flow limiting dissection at the distal left cervical ICA   Transthoracic Echocardiogram  00/00/2020 Pending  ECG - SR rate 62 BPM. (See cardiology reading for complete  details)   PHYSICAL EXAM Blood pressure (!) 148/92, pulse 61, temperature 98.7 F (37.1 C), temperature source Axillary, resp. rate (!) 32, height 5\' 11"  (1.803 m), weight 106 kg, SpO2 95 %.  Intubated and minimally sedated, PERRL, gaze preference on the left but can cross the midline, blinking bilat, eyes open, alert, following commands by voice x 4,hering intact to voice, cannot assess facial symmetry due to ET tube, sensation intact in the face and limbs, cannot assess coordination due to restraints, moving left side more than right side, left side appears anti-gravity, right side wiggling toes and leg anti-gravity, right side moving hand slightly. Withdraws x 4.     ASSESSMENT/PLAN [name] is a 54 y.o. male with history of prior stroke with residual dysarthria (ambulates with a walker), anxiety / depression, DM, PAF, ongoing tobacco use, CAD / CABG, type aortic dissection in 2014 with repair that was complicated and involved a brachiocephalic graft to the left carotid as the dissection extended into the brachiocephalic and left carotid, carotid, aortic stenosis status post mechanical aortic valve; on 2L [place] 02 @ baseline presented to the ED as a code stroke for sudden onset of leftward gaze, aphasia, and right-sided flaccid paralysis. He had been admitted to the [facility] for weakness of the right lower extremity but was treated for mostly pneumonia and reported that he been having trouble using the right leg and walking since Christmas. Since D/C he had not been getting coumadin just the lovenox, but he did not receive lovenox this morning. Last dose lovenox  was 05/07/19 PM. He did not receive IV t-PA due to Lovenox.  IR Thrombectomy Attempt - Left A2 occlusion, remained occluded at the conclusion  Stroke: Rt CVA - occlussion of the proximal A2 left anterior cerebral arterythrombus in right innominate artery that extends into the right common carotid artery; Embolic -  source - mechanical valve - PAF  Code Stroke CT Head - 2.1 x 1.1 cm focus of abnormal hypodensity within the left aspect of the callosal body and possibly also involving the pericallosal gyrus on the left.  CT head - pending  MRI head - pending  MRA head - not ordered  CTA Head - The right ICA is markedly diminutive through the siphon region with sites of high-grade stenosis. Mixed plaque also results in mild/moderate stenosis of the supraclinoid right ICA. Abrupt occlusion of the proximal A2 left anterior cerebral artery. There appears to be some reconstitution of the left ACA distally.   CTA Neck - there is a greater stenosis within the proximal left common carotid artery than originally stated - likely moderate/severe. Thrombosis of the right innominate artery. Thrombus extends into the  proximal right common carotid artery with high-grade stenosis. Sites of high-grade stenosis within the siphon region.   CT Perfusion - 125 mL region of hypoperfused parenchyma predominantly within the left ACA and ACA/MCA watershed territory, as well as right ACA/MCA watershed territory.  Carotid Doppler - CTA neck performed - carotid dopplers not indicated.  2D Echo - pending  Loyal Jacobson Virus 2 - negative  LDL - 67  HgbA1c - 6.9  UDS - not ordered  VTE prophylaxis - Heparin IV Diet  Diet Order            Diet NPO time specified  Diet effective now              aspirin 81 mg daily and Lovenox (warfarin) prior to admission, now on aspirin 325 mg and Heparin IV  Patient will be counseled to be compliant with his antithrombotic medications  Ongoing aggressive stroke risk factor management  Therapy recommendations:  pending  Disposition:  Pending  Hypertension  Home BP meds: Aldactone and Entresto  Current BP meds:   SBP - low -> Levophed . Permissive hypertension (OK if < 220/120) but gradually normalize in 5-7 days  . Long-term BP goal normotensive  Hyperlipidemia  Home  Lipid lowering medication: Crestor 40 mg daily  LDL 69, goal < 70  Current lipid lowering medication: none / NPO   Continue statin at discharge  Diabetes  Home diabetic meds: Jardiance  Current diabetic meds: SSI   HgbA1c 6.9, goal < 7.0 Recent Labs    05/09/19 0324 05/09/19 0725 05/09/19 1136  GLUCAP 132* 124* 140*    Other Stroke Risk Factors  Cigarette smoker will be advised to stop smoking  ETOH use, advised to drink no more than 1 alcoholic beverage per day.  Obesity, Body mass index is 32.59 kg/m., recommend weight loss, diet and exercise as appropriate   Hx stroke/TIA  Family hx stroke (father)  Coronary artery disease - CABG  Mechanical aortic valve (anticaogulation)  Paroxysmal atrial fibrillation (anticoagulation)  Congestive Heart Failure  Other Active Problems  Mechanical aortic valve (anticaogulation) Vascular surgery has seen and is available as needed.  Paroxysmal atrial fibrillation (anticoagulation)  LLL Pneumonia - Vancomycin started 05/08/19  DNR  Intubated / NPO  Leukocytosis - 18.1 (afebrile)  Hypotension - levophed   Hospital day # 1  This patient is critically ill and at significant risk of neurological worsening, death and care requires constant monitoring of vital signs, hemodynamics,respiratory and cardiac monitoring,review of multiple databases, neurological assessment, discussion with family, other specialists and medical decision making of high complexity.I  I spent 30 minutes of neurocritical care time in the care of this patient.  Naomie Dean, MD [facility]    To contact Stroke Continuity provider, please refer to [email or web address]. After hours, contact General Neurology

## 2019-05-09 NOTE — Evaluation (Addendum)
Physical Therapy Evaluation Patient Details Name: Jon [name] MRN: [id number] DOB: August 03, 1965 Today's Date: 05/09/2019   History of Present Illness  54 yo admitted with right weakness and left gaze with aphasia with acute left ACA anterior territory CVA s/p revascularization. VDRF 1/1-1/2. PMhx: dysarthria, aortic dissection s/p repair, AVR, GERD, CVA x 3  Clinical Impression  Pt awake, alert and with noted expressive aphasia but understanding and responding appropriately to all questions and commands. Pt with ability to bring feet to him in the bed but did not have the fine motor coordination to don socks without assist. Sitting EOB he was correctly able to read clock as "11 half past". Pt with decreased functional mobility, gait and communication who will benefit from acute therapy to maximize mobility, safety and independence to decrease burden of care.    BP in sitting 148/92 with manual cuff on LUE    Follow Up Recommendations CIR;Supervision/Assistance - 24 hour    Equipment Recommendations  None recommended by PT    Recommendations for Other Services Rehab consult     Precautions / Restrictions Precautions Precautions: Fall Precaution Comments: SBP 140-180 Restrictions Weight Bearing Restrictions: No      Mobility  Bed Mobility Overal bed mobility: Needs Assistance Bed Mobility: Supine to Sit     Supine to sit: HOB elevated;Min guard     General bed mobility comments: HOb 30 degrees with increased time, guarding for lines  Transfers Overall transfer level: Needs assistance   Transfers: Sit to/from Stand Sit to Stand: Min assist         General transfer comment: pt with cues for hand placement with +2 for lines to stand from bed  Ambulation/Gait Ambulation/Gait assistance: Min assist;+2 physical assistance Gait Distance (Feet): 4 Feet Assistive device: 2 person hand held assist Gait Pattern/deviations: Narrow base of support;Shuffle   Gait  velocity interpretation: <1.8 ft/sec, indicate of risk for recurrent falls General Gait Details: cues for safety as pt trying to reach for IV pole and performed better with bil HHA. Decreased stride of RLE, fatigues quickly with limited distance bed to chair and decreased control of descent. SpO2 dropped to 87% with gait on 2L with bump to 4L to recover to 95%  Stairs            Wheelchair Mobility    Modified Rankin (Stroke Patients Only) Modified Rankin (Stroke Patients Only) Pre-Morbid Rankin Score: Slight disability Modified Rankin: Moderately severe disability     Balance Overall balance assessment: Needs assistance   Sitting balance-Leahy Scale: Good Sitting balance - Comments: pt able to sit EOb for increased time to get bP reading without LOB   Standing balance support: Bilateral upper extremity supported Standing balance-Leahy Scale: Poor                               Pertinent Vitals/Pain Pain Assessment: No/denies pain    Home Living Family/patient expects to be discharged to:: Private residence Living Arrangements: Parent Available Help at Discharge: Family;Available 24 hours/day Type of Home: House Home Access: Stairs to enter   [employer] of Steps: 3-5 (back), 3 front Home Layout: One level Home Equipment: Shower seat;Bedside commode;Walker - 2 wheels;Cane - single point;Wheelchair - manual Additional Comments: Info from pt s/p extubation    Prior Function Level of Independence: Independent with assistive device(s)         Comments: pt reports that he lives with his mother, but was  independent with ADL and mobility with an AD usually     Hand Dominance   Dominant Hand: Right    Extremity/Trunk Assessment   Upper Extremity Assessment Upper Extremity Assessment: Defer to OT evaluation RUE Deficits / Details: Shoulder 3+/5 MM grade, elbow through hand with 4/5 MM grade, AROM, WFLs. Pt with decreased grip strength RUE  Sensation: (inconsistent with sensation on R side) RUE Coordination: decreased fine motor;decreased gross motor    Lower Extremity Assessment Lower Extremity Assessment: RLE deficits/detail RLE Deficits / Details: hip flexion 4/5, knee flexion and extension 5/5.    Cervical / Trunk Assessment Cervical / Trunk Assessment: Normal  Communication   Communication: Expressive difficulties  Cognition Arousal/Alertness: Awake/alert Behavior During Therapy: Flat affect Overall Cognitive Status: Difficult to assess                                 General Comments: pt with expressive aphasia answering yes/no questions appropriately and attempting to expand upon statements but difficult to understand      General Comments      Exercises     Assessment/Plan    PT Assessment Patient needs continued PT services  PT Problem List Decreased activity tolerance;Decreased balance;Decreased knowledge of use of DME;Decreased mobility       PT Treatment Interventions DME instruction;Therapeutic exercise;Gait training;Balance training;Functional mobility training;Therapeutic activities;Patient/family education;Stair training    PT Goals (Current goals can be found in the Care Plan section)  Acute Rehab PT Goals Patient Stated Goal: race cars PT Goal Formulation: With patient Time For Goal Achievement: 05/23/19 Potential to Achieve Goals: Good    Frequency Min 4X/week   Barriers to discharge        Co-evaluation PT/OT/SLP Co-Evaluation/Treatment: Yes Reason for Co-Treatment: For patient/therapist safety;Complexity of the patient's impairments (multi-system involvement) PT goals addressed during session: Mobility/safety with mobility;Balance         AM-PAC PT "6 Clicks" Mobility  Outcome Measure Help needed turning from your back to your side while in a flat bed without using bedrails?: A Little Help needed moving from lying on your back to sitting on the side of a flat  bed without using bedrails?: A Little Help needed moving to and from a bed to a chair (including a wheelchair)?: A Little Help needed standing up from a chair using your arms (e.g., wheelchair or bedside chair)?: A Little Help needed to walk in hospital room?: A Lot Help needed climbing 3-5 steps with a railing? : A Lot 6 Click Score: 16    End of Session Equipment Utilized During Treatment: Gait belt Activity Tolerance: Patient tolerated treatment well Patient left: in chair;with call bell/phone within reach;with chair alarm set Nurse Communication: Mobility status PT Visit Diagnosis: Other abnormalities of gait and mobility (R26.89);Other symptoms and signs involving the nervous system (R29.898)    Time: 1245-8099 PT Time Calculation (min) (ACUTE ONLY): 32 min   Charges:   PT Evaluation $PT Eval Moderate Complexity: 1 Mod          Chrystopher Stangl P, PT Acute Rehabilitation Services Pager: [phone] Office: [phone]   Sandy Salaam Kennede Lusk 05/09/2019, 12:58 PM

## 2019-05-09 NOTE — Progress Notes (Signed)
VASCULAR SURGERY:  His neurologic status has improved significantly since he underwent aspiration thrombectomy by Dr. Loreta Ave.  Vascular surgery will be available as needed.  Waverly Ferrari, MD Office: [phone]

## 2019-05-09 NOTE — Progress Notes (Signed)
NAME:  Jon [name], MRN:  [id number], DOB:  1965/10/05, LOS: 1 ADMISSION DATE:  05/08/2019, CONSULTATION DATE:  05/08/2019  REFERRING MD:  Dr Wilford Corner of neuro, CHIEF COMPLAINT:  Stroke on vent. Recent admit at [facility] for pneumonia   Brief History    Davidson Palmieri -54 year old male with complex medical history past medical history of prior stroke with residual dysarthria, type a aortic dissection by Dr. Edwina Barth in 2014 with repair that was complicated and a status post brachiocephalic graft to the left carotid, aortic stenosis status post mechanical heart valve on anticoagulation. Also has PAF  Admitted to [facility] for right-sided weakness, pneumonia and discharged on 12/31. Readmitted on 1/1 with acute stroke s/p revascularization.  PCCM consulted for help with management.  Past Medical History  Thoracic aortic aneurysm with dissection s/p repair, aortic cord stenosis s/p replacement CVA, dysarthria, GERD, hypertension, neuropathy, paroxysmal atrial fibrillation, coronary artery disease  Significant Hospital Events   05/08/2019 - admit  Consults:  PCCM, interventional radiology, vascular service  Procedures:  05/08/2019 - ETT  Significant Diagnostic Tests:    Micro Data:  Tracheal aspirate May 08, 2019 Covid  Antimicrobials:  Vancomycin 1 dose for surgical prophylaxis  Interim history/subjective:  No acute events overnight.  Patient is awake and weaning on pressure support Continues on Levophed  Objective   Blood pressure 95/82, pulse 60, temperature 98.7 F (37.1 C), temperature source Oral, resp. rate (!) 23, height 5\' 11"  (1.803 m), weight 106 kg, SpO2 98 %.    Vent Mode: PSV;CPAP FiO2 (%):  [40 %-50 %] 40 % Set Rate:  [14 bmp] 14 bmp Vt Set:  [600 mL] 600 mL PEEP:  [5 cmH20] 5 cmH20 Pressure Support:  [8 cmH20] 8 cmH20 Plateau Pressure:  [12 cmH20-16 cmH20] 12 cmH20   Intake/Output Summary (Last 24 hours) at 05/09/2019 0835 Last data filed at  05/09/2019 0800 Gross per 24 hour  Intake 3735.69 ml  Output 1350 ml  Net 2385.69 ml   Filed Weights   05/08/19 1000 05/08/19 1630  Weight: 106 kg 106 kg    Examination: Gen:      No acute distress HEENT:  EOMI, sclera anicteric, ETT Neck:     No masses; no thyromegaly Lungs:    Clear to auscultation bilaterally; normal respiratory effort CV:         Regular rate and rhythm; no murmurs Abd:      + bowel sounds; soft, non-tender; no palpable masses, no distension Ext:    No edema; adequate peripheral perfusion Skin:      Warm and dry; no rash Neuro: Awake, responds to questions.  Resolved Hospital Problem list     Assessment & Plan:  Acute respiratory failure secondary to stroke Pressure support weans as tolerated. Follow-up chest x-ray, ABG He had a recent admission and treatment for pneumonia with chest x-ray showing airspace opacities but no evidence of ongoing infection We will observe off antibiotics as PCT is low.  Follow cultures.   Left-sided anterior cerebral artery circulation stroke status post thrombectomy and revascularization Management per neurology  Aortic dissection type a in 2014 with complex repair Dr. 2015 Status post aortic prostatic valve on anticoagulation Coronary artery disease, Paroxysmal atrial fibrillation Levophed to maintain goal blood pressure SBP 140-1 80 Electrocardiogram Heparin drip  Mild transaminitis Monitor labs  Best practice:  Diet: N.p.o. Pain/Anxiety/Delirium protocol (if indicated): Propofol, fentanyl  VAP protocol (if indicated): Yes DVT prophylaxis: Heparin drip  GI prophylaxis: PPI Glucose control: SSI  Mobility: Bedrest Code Status: Full code Family Communication: By primary service neurology Disposition: ICU  LABS    PULMONARY Recent Labs  Lab 05/08/19 1047 05/08/19 1810  PHART  --  7.345*  PCO2ART  --  46.7  PO2ART  --  103.0  HCO3  --  25.7  TCO2 23 27  O2SAT  --  98.0    CBC Recent Labs   Lab 05/08/19 1041 05/08/19 1047 05/08/19 1810 05/09/19 0504  HGB 14.7 15.6 12.6* 12.7*  HCT 45.1 46.0 37.0* 39.1  WBC 16.6*  --   --  18.1*  PLT 357  --   --  370    COAGULATION Recent Labs  Lab 05/08/19 1041 05/09/19 0504  INR 1.4* 1.5*    CARDIAC  No results for input(s): TROPONINI in the last 168 hours. No results for input(s): PROBNP in the last 168 hours.   CHEMISTRY Recent Labs  Lab 05/08/19 1041 05/08/19 1047 05/08/19 1810 05/08/19 2357 05/09/19 0112  NA 136 136 136 137 138  K 4.3 4.1 4.5 3.9 3.7  CL 102 102  --  107 106  CO2 23  --   --  22 17*  GLUCOSE 202* 192*  --  145* 138*  BUN 19 18  --  11 10  CREATININE 1.02 0.90  --  0.82 0.78  CALCIUM 8.5*  --   --  8.0* 7.8*  MG  --   --   --   --  2.1  PHOS  --   --   --   --  3.5   Estimated Creatinine Clearance: 132.3 mL/min (by C-G formula based on SCr of 0.78 mg/dL).   LIVER Recent Labs  Lab 05/08/19 1041 05/09/19 0112 05/09/19 0504  AST 47* 34  --   ALT 81* 63*  --   ALKPHOS 123 116  --   BILITOT 1.1 0.6  --   PROT 6.1* 5.3*  --   ALBUMIN 3.1* 2.6*  --   INR 1.4*  --  1.5*     INFECTIOUS Recent Labs  Lab 05/08/19 2030 05/09/19 0107 05/09/19 0112  LATICACIDVEN  --  1.0  --   PROCALCITON 0.11  --  <0.10     ENDOCRINE CBG (last 3)  Recent Labs    05/08/19 2319 05/09/19 0324 05/09/19 0725  GLUCAP 183* 132* 124*    The patient is critically ill with multiple organ system failure and requires high complexity decision making for assessment and support, frequent evaluation and titration of therapies, advanced monitoring, review of radiographic studies and interpretation of complex data.   Critical Care Time devoted to patient care services, exclusive of separately billable procedures, described in this note is 35 minutes.   Marshell Garfinkel MD [facility] Please see [email or web address] for pager details.  05/09/2019, 8:45 AM

## 2019-05-09 NOTE — Progress Notes (Signed)
ANTICOAGULATION CONSULT NOTE   Pharmacy Consult for Heparin Indication: stroke and mechanical valve  Allergies  Allergen Reactions  . Hydrocodone-Acetaminophen Other (See Comments)    PT MOTHER DOES NOT REMEMBER REACTION  Only use in Emergency   . Hydromorphone Itching and Rash       . Other Other (See Comments)    ALL NARCOTICS-ITCHING, RASH   . Oxycodone Other (See Comments)    Unknown reaction  . Penicillins Rash    Did it involve swelling of the face/tongue/throat, SOB, or low BP? Unknown Did it involve sudden or severe rash/hives, skin peeling, or any reaction on the inside of your mouth or nose? Unknown Did you need to seek medical attention at a hospital or doctor's office? Unknown When did it last happen?unknown If all above answers are "NO", may proceed with cephalosporin use.  . Dilaudid [Hydromorphone Hcl] Itching and Rash    Patient Measurements: Height: 5\' 11"  (180.3 cm) Weight: 233 lb 11 oz (106 kg) IBW/kg (Calculated) : 75.3 Heparin Dosing Weight: 97.7kg  Vital Signs: Temp: 98.7 F (37.1 C) (01/02 0400) Temp Source: Oral (01/02 0400) BP: 93/78 (01/02 1045) Pulse Rate: 61 (01/02 1045)  Labs: Recent Labs    05/08/19 1041 05/08/19 1047 05/08/19 1047 05/08/19 1810 05/08/19 2357 05/09/19 0107 05/09/19 0112 05/09/19 0504 05/09/19 1028  HGB 14.7 15.6  --  12.6*  --   --   --  12.7*  --   HCT 45.1 46.0  --  37.0*  --   --   --  39.1  --   PLT 357  --   --   --   --   --   --  370  --   APTT 25  --   --   --   --   --   --   --   --   LABPROT 17.4*  --   --   --   --   --   --  17.5*  --   INR 1.4*  --   --   --   --   --   --  1.5*  --   HEPARINUNFRC  --   --   --   --   --  0.15*  --   --  0.25*  CREATININE 1.02 0.90  --   --  0.82  --  0.78  --   --   TROPONINIHS  --   --    < >  --  228*  --  236* 226*  --    < > = values in this interval not displayed.    Estimated Creatinine Clearance: 132.3 mL/min (by C-G formula based on SCr of 0.78  mg/dL).   Medical History: Past Medical History:  Diagnosis Date  . Aneurysm of thoracic aorta (HCC) 05/06/2012   IMO routine update IMO routine update  . Anxiety    sees Dr. 05/08/2012  . Aortic aneurysm and dissection (HCC) 05/06/2012  . Aortic stenosis    mechanical AVR 04/22/12  . Aortic valve disorder 05/06/2012  . 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 Bicuspid aortic valve   . Cerebral infarction (HCC) 05/11/2012  . Cerebral thrombosis with cerebral infarction (HCC) 07/14/2013  . Cholelithiasis 12/20/2016  . Current use of long term anticoagulation 12/20/2016  . CVA (cerebral infarction)    occipital CVA 12/13  . Dissecting aortic aneurysm (HCC) 05/06/2012  . Dysarthria 09/10/2017  . Dysrhythmia  sees Dr. Leslie Dales, [facility] cardiology in [place]  . Generalized constriction of visual field 10/15/2012   IMOUPDATE IMOUPDATE  . 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 GERD (gastroesophageal reflux disease)   . HCAP (healthcare-associated pneumonia) 05/06/2012  . Headache(784.0)    "due to vision problem"  . History of CVA (cerebrovascular accident) 05/07/2012  . Hypothyroidism   . Idiopathic peripheral neuropathy 07/14/2013  . Late effects of CVA (cerebrovascular accident) 08/08/2015  . Nontraumatic multiple localized intracerebral hemorrhages ([place] [employer]) 10/16/2017  . Paroxysmal atrial fibrillation ([place]) 08/08/2015   On warfarin On warfarin  . Presence of prosthetic heart valve 08/08/2015  . Shortness of breath   . Stroke ([place])    x3  . Stroke, embolic ([place]) 01/09/1739  . Syncope 05/06/2012  . Thoracic aortic aneurysm ([place])    replacement aortic root 04/22/12  . VBI (vertebrobasilar insufficiency) 09/10/2017    Medications:  Infusions:  . sodium chloride Stopped (05/09/19 0803)  . clevidipine    . dextrose 5% lactated ringers 50 mL/hr at 05/09/19 0700  . heparin 1,150 Units/hr (05/09/19 0700)  . norepinephrine (LEVOPHED) Adult infusion  12 mcg/min (05/09/19 0700)  . propofol (DIPRIVAN) infusion 15 mcg/kg/min (05/09/19 0700)    Assessment: 22 yom presented to the ED with stroke symptoms. S/p recent discharge from [facility]. He is supposed to be on warfarin and lovenox for history of a mechanical valve. INR is subtherapeutic at 1.4 and last dose of lovenox was last night. Pt is now s/p IR to start IV heparin. CBC is WNL.   Heparin level subtherapeutic s/p rate increase to 1150 units/hr, no boluses  Goal of Therapy:  Heparin level 0.3-0.5 units/ml Monitor platelets by anticoagulation protocol: Yes   Plan:  No boluses  Increase heparin gtt to 1350 units/hr F/u 6 hour heparin level  Bertis Ruddy, PharmD Clinical Pharmacist Please check AMION for all [place] numbers 05/09/2019 11:15 AM

## 2019-05-10 ENCOUNTER — Inpatient Hospital Stay (HOSPITAL_COMMUNITY): Payer: Medicaid Other

## 2019-05-10 LAB — HEPARIN LEVEL (UNFRACTIONATED)
Heparin Unfractionated: 0.47 IU/mL (ref 0.30–0.70)
Heparin Unfractionated: 0.55 IU/mL (ref 0.30–0.70)

## 2019-05-10 LAB — GLUCOSE, CAPILLARY
Glucose-Capillary: 90 mg/dL (ref 70–99)
Glucose-Capillary: 92 mg/dL (ref 70–99)
Glucose-Capillary: 98 mg/dL (ref 70–99)
Glucose-Capillary: 145 mg/dL — ABNORMAL HIGH (ref 70–99)
Glucose-Capillary: 111 mg/dL — ABNORMAL HIGH (ref 70–99)

## 2019-05-10 LAB — CBC WITH DIFFERENTIAL/PLATELET
Abs Immature Granulocytes: 0.14 10*3/uL — ABNORMAL HIGH (ref 0.00–0.07)
Basophils Absolute: 0 10*3/uL (ref 0.0–0.1)
Basophils Relative: 0 %
Eosinophils Absolute: 0.3 10*3/uL (ref 0.0–0.5)
Eosinophils Relative: 2 %
HCT: 36.6 % — ABNORMAL LOW (ref 39.0–52.0)
Hemoglobin: 11.7 g/dL — ABNORMAL LOW (ref 13.0–17.0)
Immature Granulocytes: 1 %
Lymphocytes Relative: 6 %
Lymphs Abs: 1 10*3/uL (ref 0.7–4.0)
MCH: 28.8 pg (ref 26.0–34.0)
MCHC: 32 g/dL (ref 30.0–36.0)
MCV: 90.1 fL (ref 80.0–100.0)
Monocytes Absolute: 1.1 10*3/uL — ABNORMAL HIGH (ref 0.1–1.0)
Monocytes Relative: 7 %
Neutro Abs: 13.8 10*3/uL — ABNORMAL HIGH (ref 1.7–7.7)
Neutrophils Relative %: 84 %
Platelets: 313 10*3/uL (ref 150–400)
RBC: 4.06 MIL/uL — ABNORMAL LOW (ref 4.22–5.81)
RDW: 15.5 % (ref 11.5–15.5)
WBC: 16.3 10*3/uL — ABNORMAL HIGH (ref 4.0–10.5)
nRBC: 0 % (ref 0.0–0.2)

## 2019-05-10 LAB — PROCALCITONIN: Procalcitonin: <0.1 ng/mL

## 2019-05-10 LAB — LEGIONELLA PNEUMOPHILA SEROGP 1 UR AG: L. pneumophila Serogp 1 Ur Ag: NEGATIVE

## 2019-05-10 LAB — CULTURE, RESPIRATORY W GRAM STAIN
Culture: NORMAL
Special Requests: NORMAL

## 2019-05-10 LAB — MAGNESIUM: Magnesium: 2 mg/dL (ref 1.7–2.4)

## 2019-05-10 LAB — PHOSPHORUS: Phosphorus: 3.2 mg/dL (ref 2.5–4.6)

## 2019-05-10 NOTE — Progress Notes (Signed)
Physical Therapy Treatment Patient Details Name: Jon [name] MRN: [id number] DOB: 09-18-1965 Today's Date: 05/10/2019    History of Present Illness 54 yo admitted with right weakness and left gaze with aphasia with acute left ACA anterior territory CVA s/p revascularization. VDRF 1/1-1/2. PMhx: dysarthria, aortic dissection s/p repair, AVR, GERD, CVA x 3    PT Comments    Pt dysarthric and seems to be functionally receptive to commands given simply and slowly.  Pt continues to she weakness and incoordination on the R, upper>lower.  Emphasis on transition, sit to stand, balance and gait training.    Follow Up Recommendations  CIR;Supervision/Assistance - 24 hour     Equipment Recommendations  None recommended by PT    Recommendations for Other Services Rehab consult     Precautions / Restrictions Precautions Precautions: Fall    Mobility  Bed Mobility Overal bed mobility: Needs Assistance Bed Mobility: Rolling;Sidelying to Sit Rolling: Mod assist Sidelying to sit: Mod assist(via R side)       General bed mobility comments: cued for technique, assist given slowly to allow pt to assist with UE's as able.  Transfers Overall transfer level: Needs assistance   Transfers: Sit to/from Stand Sit to Stand: Mod assist         General transfer comment: from a lower surface, cues for hand placement, assist to come forward and support for pt to boost.  Ambulation/Gait Ambulation/Gait assistance: Mod assist;+2 physical assistance;+2 safety/equipment Gait Distance (Feet): 25 Feet Assistive device: Rolling walker (2 wheeled) Gait Pattern/deviations: Step-to pattern;Step-through pattern;Decreased step length - right;Decreased step length - left;Decreased stance time - right;Decreased stride length Gait velocity: slower   General Gait Details: Used RW which turned out to be a liability due to pt unable to keep R hand on the RW without consistent assist.  Pt need w/shift R  assist and support while he advance his R LE then stability/support at R knee for advancing the L LE.  Second help was for IV and controlling the RW and could not then support the patient.   Stairs             Wheelchair Mobility    Modified Rankin (Stroke Patients Only) Modified Rankin (Stroke Patients Only) Pre-Morbid Rankin Score: Slight disability Modified Rankin: Moderately severe disability     Balance Overall balance assessment: Needs assistance Sitting-balance support: Bilateral upper extremity supported;No upper extremity supported Sitting balance-Leahy Scale: Fair Sitting balance - Comments: able to sit without UE support   Standing balance support: Bilateral upper extremity supported Standing balance-Leahy Scale: Poor Standing balance comment: pt with moderate list R with uncoordinated knee control and R hand not able to fully support him.                            Cognition Arousal/Alertness: Awake/alert Behavior During Therapy: Flat affect Overall Cognitive Status: Difficult to assess(seems to answer all simple questions approp. given time)                                 General Comments: pt with expressive aphasia answering yes/no questions appropriately and attempting to expand upon statements but difficult to understand      Exercises      General Comments        Pertinent Vitals/Pain Pain Assessment: Faces Faces Pain Scale: No hurt Pain Intervention(s): Monitored during session    Home  Living                      Prior Function            PT Goals (current goals can now be found in the care plan section) Acute Rehab PT Goals PT Goal Formulation: With patient Time For Goal Achievement: 05/23/19 Potential to Achieve Goals: Good Progress towards PT goals: Progressing toward goals    Frequency    Min 4X/week      PT Plan Current plan remains appropriate    Co-evaluation               AM-PAC PT "6 Clicks" Mobility   Outcome Measure  Help needed turning from your back to your side while in a flat bed without using bedrails?: A Lot Help needed moving from lying on your back to sitting on the side of a flat bed without using bedrails?: A Lot Help needed moving to and from a bed to a chair (including a wheelchair)?: A Lot Help needed standing up from a chair using your arms (e.g., wheelchair or bedside chair)?: A Lot Help needed to walk in hospital room?: A Lot Help needed climbing 3-5 steps with a railing? : A Lot 6 Click Score: 12    End of Session   Activity Tolerance: Patient tolerated treatment well Patient left: in chair;with call bell/phone within reach;with chair alarm set Nurse Communication: Mobility status PT Visit Diagnosis: Other abnormalities of gait and mobility (R26.89);Other symptoms and signs involving the nervous system (R29.898)     Time: 7001-7494 PT Time Calculation (min) (ACUTE ONLY): 23 min  Charges:  $Gait Training: 8-22 mins $Neuromuscular Re-education: 8-22 mins                     05/10/2019  Jon [name]., PT Acute Rehabilitation Services [phone]  (pager) [phone]  (office)   Jon [name] 05/10/2019, 4:21 PM

## 2019-05-10 NOTE — Progress Notes (Signed)
[facility] for Heparin Indication: stroke and mechanical valve  Allergies  Allergen Reactions  . Hydrocodone-Acetaminophen Other (See Comments)    PT MOTHER DOES NOT REMEMBER REACTION  Only use in Emergency   . Hydromorphone Itching and Rash       . Other Other (See Comments)    ALL NARCOTICS-ITCHING, RASH   . Oxycodone Other (See Comments)    Unknown reaction  . Penicillins Rash    Did it involve swelling of the face/tongue/throat, SOB, or low BP? Unknown Did it involve sudden or severe rash/hives, skin peeling, or any reaction on the inside of your mouth or nose? Unknown Did you need to seek medical attention at a hospital or doctor's office? Unknown When did it last happen?unknown If all above answers are "NO", may proceed with cephalosporin use.  . Dilaudid [Hydromorphone Hcl] Itching and Rash    Patient Measurements: Height: 5\' 11"  (180.3 cm) Weight: 233 lb 11 oz (106 kg) IBW/kg (Calculated) : 75.3 Heparin Dosing Weight: 97.7kg  Vital Signs: Temp: 98 F (36.7 C) (01/03 0800) Temp Source: Axillary (01/03 0800) BP: 140/119 (01/03 1400) Pulse Rate: 64 (01/03 1400)  Labs: Recent Labs    05/08/19 1041 05/08/19 1047 05/08/19 1047 05/08/19 1810 05/08/19 1810 05/08/19 2030 05/08/19 2357 05/09/19 0112 05/09/19 0504 05/09/19 1028 05/09/19 2009 05/10/19 0505 05/10/19 1347  HGB 14.7 15.6  --  12.6*  --   --   --   --  12.7*  --   --  11.7*  --   HCT 45.1 46.0  --  37.0*  --   --   --   --  39.1  --   --  36.6*  --   PLT 357  --   --   --   --   --   --   --  370  --   --  313  --   APTT 25  --   --   --   --   --   --   --   --   --   --   --   --   LABPROT 17.4*  --   --   --   --   --   --   --  17.5*  --   --   --   --   INR 1.4*  --   --   --   --   --   --   --  1.5*  --   --   --   --   HEPARINUNFRC  --   --   --   --    < >   < >  --   --   --  0.25* 0.29* 0.47 0.55  CREATININE 1.02 0.90  --   --   --   --  0.82  0.78  --   --   --   --   --   TROPONINIHS  --   --    < >  --   --   --  228* 236* 226* 230*  --   --   --    < > = values in this interval not displayed.    Estimated Creatinine Clearance: 132.3 mL/min (by C-G formula based on SCr of 0.78 mg/dL).   Medical History: Past Medical History:  Diagnosis Date  . Aneurysm of thoracic aorta ([place]) 05/06/2012   IMO routine update IMO  routine update  . Anxiety    sees Dr. Renae Fickle  . Aortic aneurysm and dissection (HCC) 05/06/2012  . Aortic stenosis    mechanical AVR 04/22/12  . Aortic valve disorder 05/06/2012  . 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 Bicuspid aortic valve   . Cerebral infarction (HCC) 05/11/2012  . Cerebral thrombosis with cerebral infarction (HCC) 07/14/2013  . Cholelithiasis 12/20/2016  . Current use of long term anticoagulation 12/20/2016  . CVA (cerebral infarction)    occipital CVA 12/13  . Dissecting aortic aneurysm (HCC) 05/06/2012  . Dysarthria 09/10/2017  . Dysrhythmia    sees Dr. Geronimo Running, [facility] cardiology in [place]  . Generalized constriction of visual field 10/15/2012   IMOUPDATE IMOUPDATE  . 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 GERD (gastroesophageal reflux disease)   . HCAP (healthcare-associated pneumonia) 05/06/2012  . Headache(784.0)    "due to vision problem"  . History of CVA (cerebrovascular accident) 05/07/2012  . Hypothyroidism   . Idiopathic peripheral neuropathy 07/14/2013  . Late effects of CVA (cerebrovascular accident) 08/08/2015  . Nontraumatic multiple localized intracerebral hemorrhages (HCC) 10/16/2017  . Paroxysmal atrial fibrillation (HCC) 08/08/2015   On warfarin On warfarin  . Presence of prosthetic heart valve 08/08/2015  . Shortness of breath   . Stroke (HCC)    x3  . Stroke, embolic (HCC) 05/07/2012  . Syncope 05/06/2012  . Thoracic aortic aneurysm (HCC)    replacement aortic root 04/22/12  . VBI (vertebrobasilar insufficiency)  09/10/2017    Medications:  Infusions:  . clevidipine    . heparin 1,450 Units/hr (05/10/19 1320)  . norepinephrine (LEVOPHED) Adult infusion 2 mcg/min (05/10/19 0732)  . propofol (DIPRIVAN) infusion Stopped (05/09/19 0850)    Assessment: 53 yom presented to the ED with stroke symptoms. S/p recent discharge from [facility]. He is supposed to be on warfarin and lovenox for history of a mechanical valve. INR is subtherapeutic at 1.4 and last dose of lovenox was last night. Pt is now s/p IR to on IV heparin  Heparin level slightly supratherapeutic with lower heparin goals at 0.55 on 1450 units/hr  Goal of Therapy:  Heparin level 0.3-0.5 units/ml Monitor platelets by anticoagulation protocol: Yes   Plan:  Decrease heparin gtt slightly to 1400 units/hr Daily heparin level, CBC, s/s bleeding   Daylene Posey, PharmD Clinical Pharmacist Please check AMION for all [facility] Pharmacy numbers 05/10/2019 2:22 PM

## 2019-05-10 NOTE — Progress Notes (Signed)
[place] for Heparin Indication: stroke and mechanical valve  Allergies  Allergen Reactions  . Hydrocodone-Acetaminophen Other (See Comments)    PT MOTHER DOES NOT REMEMBER REACTION  Only use in Emergency   . Hydromorphone Itching and Rash       . Other Other (See Comments)    ALL NARCOTICS-ITCHING, RASH   . Oxycodone Other (See Comments)    Unknown reaction  . Penicillins Rash    Did it involve swelling of the face/tongue/throat, SOB, or low BP? Unknown Did it involve sudden or severe rash/hives, skin peeling, or any reaction on the inside of your mouth or nose? Unknown Did you need to seek medical attention at a hospital or doctor's office? Unknown When did it last happen?unknown If all above answers are "NO", may proceed with cephalosporin use.  . Dilaudid [Hydromorphone Hcl] Itching and Rash    Patient Measurements: Height: 5\' 11"  (180.3 cm) Weight: 233 lb 11 oz (106 kg) IBW/kg (Calculated) : 75.3 Heparin Dosing Weight: 97.7kg  Vital Signs: Temp: 98.4 F (36.9 C) (01/03 0400) Temp Source: Oral (01/03 0400) BP: 143/79 (01/03 0700) Pulse Rate: 63 (01/03 0700)  Labs: Recent Labs    05/08/19 1041 05/08/19 1047 05/08/19 1047 05/08/19 1810 05/08/19 1810 05/08/19 2030 05/08/19 2357 05/09/19 0112 05/09/19 0504 05/09/19 1028 05/09/19 2009 05/10/19 0505  HGB 14.7 15.6  --  12.6*  --   --   --   --  12.7*  --   --  11.7*  HCT 45.1 46.0  --  37.0*  --   --   --   --  39.1  --   --  36.6*  PLT 357  --   --   --   --   --   --   --  370  --   --  313  APTT 25  --   --   --   --   --   --   --   --   --   --   --   LABPROT 17.4*  --   --   --   --   --   --   --  17.5*  --   --   --   INR 1.4*  --   --   --   --   --   --   --  1.5*  --   --   --   HEPARINUNFRC  --   --   --   --    < >   < >  --   --   --  0.25* 0.29* 0.47  CREATININE 1.02 0.90  --   --   --   --  0.82 0.78  --   --   --   --   TROPONINIHS  --   --    <  >  --   --   --  228* 236* 226* 230*  --   --    < > = values in this interval not displayed.    Estimated Creatinine Clearance: 132.3 mL/min (by C-G formula based on SCr of 0.78 mg/dL).   Medical History: Past Medical History:  Diagnosis Date  . Aneurysm of thoracic aorta ([place]) 05/06/2012   IMO routine update IMO routine update  . Anxiety    sees Dr. Wende Neighbors  . Aortic aneurysm and dissection ([place]) 05/06/2012  . Aortic stenosis  mechanical AVR 04/22/12  . Aortic valve disorder 05/06/2012  . 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 Bicuspid aortic valve   . Cerebral infarction (HCC) 05/11/2012  . Cerebral thrombosis with cerebral infarction (HCC) 07/14/2013  . Cholelithiasis 12/20/2016  . Current use of long term anticoagulation 12/20/2016  . CVA (cerebral infarction)    occipital CVA 12/13  . Dissecting aortic aneurysm (HCC) 05/06/2012  . Dysarthria 09/10/2017  . Dysrhythmia    sees Dr. Geronimo Running, [facility] cardiology in [place]  . Generalized constriction of visual field 10/15/2012   IMOUPDATE IMOUPDATE  . 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 GERD (gastroesophageal reflux disease)   . HCAP (healthcare-associated pneumonia) 05/06/2012  . Headache(784.0)    "due to vision problem"  . History of CVA (cerebrovascular accident) 05/07/2012  . Hypothyroidism   . Idiopathic peripheral neuropathy 07/14/2013  . Late effects of CVA (cerebrovascular accident) 08/08/2015  . Nontraumatic multiple localized intracerebral hemorrhages (HCC) 10/16/2017  . Paroxysmal atrial fibrillation (HCC) 08/08/2015   On warfarin On warfarin  . Presence of prosthetic heart valve 08/08/2015  . Shortness of breath   . Stroke (HCC)    x3  . Stroke, embolic (HCC) 05/07/2012  . Syncope 05/06/2012  . Thoracic aortic aneurysm (HCC)    replacement aortic root 04/22/12  . VBI (vertebrobasilar insufficiency) 09/10/2017    Medications:  Infusions:  . clevidipine    .  heparin 1,450 Units/hr (05/10/19 0700)  . norepinephrine (LEVOPHED) Adult infusion 4 mcg/min (05/10/19 0700)  . propofol (DIPRIVAN) infusion Stopped (05/09/19 0850)    Assessment: 53 yom presented to the ED with stroke symptoms. S/p recent discharge from [facility]. He is supposed to be on warfarin and lovenox for history of a mechanical valve. INR is subtherapeutic at 1.4 and last dose of lovenox was last night. Pt is now s/p IR to on IV heparin  Heparin level therapeutic at 0.47 s/p rate increase to 1450 units/hr  Goal of Therapy:  Heparin level 0.3-0.5 units/ml Monitor platelets by anticoagulation protocol: Yes   Plan:  Continue heparin gtt at 1450 units/hr F/u 6 hour heparin level to confirm  Daylene Posey, PharmD Clinical Pharmacist Please check AMION for all [facility] numbers 05/10/2019 7:06 AM

## 2019-05-10 NOTE — Evaluation (Signed)
Clinical/Bedside Swallow Evaluation Patient Details  Name: Germany Chelf MRN: [id number] Date of Birth: 1966/04/15  Today's Date: 05/10/2019 Time: SLP Start Time (ACUTE ONLY): 0845 SLP Stop Time (ACUTE ONLY): 0915 SLP Time Calculation (min) (ACUTE ONLY): 30 min  Past Medical History:  Past Medical History:  Diagnosis Date  . Aneurysm of thoracic aorta ([place]) 05/06/2012   IMO routine update IMO routine update  . Anxiety    sees Dr. Wende Neighbors  . Aortic aneurysm and dissection ([place]) 05/06/2012  . Aortic stenosis    mechanical AVR 04/22/12  . Aortic valve disorder 05/06/2012  . 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 Bicuspid aortic valve   . Cerebral infarction ([place]) 05/11/2012  . Cerebral thrombosis with cerebral infarction ([place]) 07/14/2013  . Cholelithiasis 12/20/2016  . Current use of long term anticoagulation 12/20/2016  . CVA (cerebral infarction)    occipital CVA 12/13  . Dissecting aortic aneurysm ([place]) 05/06/2012  . Dysarthria 09/10/2017  . Dysrhythmia    sees Dr. Leslie Dales, [facility] cardiology in [place]  . Generalized constriction of visual field 10/15/2012   IMOUPDATE IMOUPDATE  . 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 GERD (gastroesophageal reflux disease)   . HCAP (healthcare-associated pneumonia) 05/06/2012  . Headache(784.0)    "due to vision problem"  . History of CVA (cerebrovascular accident) 05/07/2012  . Hypothyroidism   . Idiopathic peripheral neuropathy 07/14/2013  . Late effects of CVA (cerebrovascular accident) 08/08/2015  . Nontraumatic multiple localized intracerebral hemorrhages ([place]) 10/16/2017  . Paroxysmal atrial fibrillation ([place]) 08/08/2015   On warfarin On warfarin  . Presence of prosthetic heart valve 08/08/2015  . Shortness of breath   . Stroke ([place])    x3  . Stroke, embolic ([place]) 0/0/9233  . Syncope 05/06/2012  . Thoracic aortic aneurysm ([place])    replacement aortic root 04/22/12  . VBI  (vertebrobasilar insufficiency) 09/10/2017   Past Surgical History:  Past Surgical History:  Procedure Laterality Date  . APPLICATION OF WOUND VAC  06/11/2012   Procedure: APPLICATION OF WOUND VAC;  Surgeon: Melrose Nakayama, MD;  Location: [place];  Service: Vascular;  Laterality: Right;  . CARDIAC CATHETERIZATION    . CHOLECYSTECTOMY    . CORONARY ARTERY BYPASS GRAFT  04/22/2012   Procedure: CORONARY ARTERY BYPASS GRAFTING (CABG);  Surgeon: Melrose Nakayama, MD;  Location: [place];  Service: Open Heart Surgery;  Laterality: N/A;  times one with right greater saphenous vein graft  . I & D EXTREMITY  06/11/2012   Procedure: IRRIGATION AND DEBRIDEMENT EXTREMITY;  Surgeon: Melrose Nakayama, MD;  Location: [place];  Service: Vascular;  Laterality: Right;  . INTRAOPERATIVE TRANSESOPHAGEAL ECHOCARDIOGRAM  04/22/2012   Procedure: INTRAOPERATIVE TRANSESOPHAGEAL ECHOCARDIOGRAM;  Surgeon: Melrose Nakayama, MD;  Location: [place];  Service: Open Heart Surgery;  Laterality: N/A;  . IR CT HEAD LTD  05/08/2019  . IR PERCUTANEOUS ART THROMBECTOMY/INFUSION INTRACRANIAL INC DIAG ANGIO  05/08/2019  . IR US GUIDE VASC ACCESS RIGHT  05/08/2019  . ROTATOR CUFF REPAIR     12 years ago, Left  . THORACIC AORTIC ANEURYSM REPAIR  04/22/2012   Procedure: THORACIC ASCENDING ANEURYSM REPAIR (AAA);  Surgeon: Melrose Nakayama, MD;  Location: [place];  Service: Open Heart Surgery;  Laterality: N/A;   HPI:  54 yo admitted with right weakness and left gaze with aphasia with acute left ACA anterior territory CVA s/p revascularization. VDRF 1/1-1/2. PMhx: dysarthria, aortic dissection s/p repair, AVR, GERD, CVA x 3. Prior  CVA resulted in verbosity, cognitive impairment including impulsivity, mild dysphagia. In 2014 MBS recommended regualr thin with a chin tuck.    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 Pt has difficulty with posterior propulsion of bolus. Seems to like a posterior head tilt to transit, does much better with thicker liquids and self feeding with puree, which seems to improve initiation and automaticity. Will proceed with MBS today for best diet texture and strategies. RN may give meds crushed in the meantime.  SLP Visit Diagnosis: Dysphagia, unspecified (R13.10)    Aspiration Risk  Moderate aspiration risk    Diet Recommendation NPO except meds   Medication Administration: Crushed with puree Supervision: Staff to assist with self feeding Compensations: Slow rate;Small sips/bites Postural Changes: Seated upright at 90 degrees    Other  Recommendations     Follow up Recommendations        Frequency and Duration            Prognosis        Swallow Study   General HPI: 54 yo admitted with right weakness and left gaze with aphasia with acute left ACA anterior territory CVA s/p revascularization. VDRF 1/1-1/2. PMhx: dysarthria, aortic dissection s/p repair, AVR, GERD, CVA x 3. Prior CVA resulted in verbosity, cognitive impairment including impulsivity, mild dysphagia. In 2014 MBS recommended regualr thin with a chin tuck.  Type of Study: Bedside Swallow Evaluation Diet Prior to this Study: NPO Temperature Spikes Noted: No Respiratory Status: Nasal cannula History of Recent Intubation: No Behavior/Cognition: Alert;Cooperative;Pleasant mood Oral Cavity Assessment: (mild right secretions) Oral Care Completed by SLP: Recent completion by staff Oral Cavity - Dentition: Adequate natural dentition Vision: Functional for self-feeding Self-Feeding Abilities: Needs assist Patient Positioning: Upright in bed Baseline Vocal Quality:  (hypernasal) Volitional Cough: (I forgot to try! might not be able to do) Volitional Swallow: Able to elicit    Oral/Motor/Sensory Function Overall Oral Motor/Sensory Function: Severe impairment Facial ROM: Reduced right;Suspected CN VII (facial) dysfunction Facial Symmetry: Abnormal symmetry right;Suspected CN VII (facial) dysfunction Facial Strength: Reduced right;Suspected CN VII (facial) dysfunction Facial Sensation: Within Functional Limits Lingual ROM: Reduced right;Suspected CN XII (hypoglossal) dysfunction Lingual Symmetry: Abnormal symmetry right;Suspected CN XII (hypoglossal) dysfunction Lingual Strength: Reduced Lingual Sensation: Within Functional Limits Velum: Impaired right;Suspected CN X (Vagus) dysfunction Mandible: Within Functional Limits   Ice Chips Ice chips: Impaired Presentation: Spoon Oral Phase Impairments: Reduced labial seal;Reduced lingual movement/coordination Oral Phase Functional Implications: Prolonged oral transit Pharyngeal Phase Impairments: Cough - Immediate   Thin Liquid Thin Liquid: Impaired Presentation: Cup;Straw Oral Phase Functional Implications: Prolonged oral transit;Right anterior spillage Pharyngeal  Phase Impairments: Multiple swallows;Cough - Delayed    Nectar Thick Nectar Thick Liquid: Not tested   Honey Thick Honey Thick Liquid: Impaired Presentation: Cup;Self fed;Straw Oral Phase Impairments: Reduced labial seal;Reduced lingual movement/coordination Oral Phase Functional Implications: Right anterior spillage;Prolonged oral transit   Puree Puree: Impaired Presentation: Spoon;Self Fed Oral Phase Impairments: Reduced lingual movement/coordination;Reduced labial seal Oral Phase Functional Implications: Oral residue;Right anterior spillage;Prolonged oral transit   Solid     Solid: Not tested      Soumya Colson, [name] Nearing 05/10/2019,9:50 AM

## 2019-05-10 NOTE — Progress Notes (Addendum)
STROKE TEAM PROGRESS NOTE   HISTORY OF PRESENT ILLNESS (per record) Jon [name] is an 54 y.o. male  with past medical history of a prior stroke with residual dysarthria, type aortic dissection in 2014 with repair that was complicated and involved a brachiocephalic graft to the left carotid as the dissection extended into the brachiocephalic and left carotid, carotid, aortic stenosis status post mechanical valve, presented to the ED as a code stroke for sudden onset of leftward gaze and right-sided flaccid paralysis. It was very difficult to reach his family-number listed for the mother rang busy multiple times.  Finally was able to reach the mother to get history. Per EMS and mother Jon [name] patient woke up this morning at 0900. He was in his usual state of health, he ate breakfast and they held a conversation. Patient went to the bathroom and had a sudden onset of aphasia, right side weakness. He slumped over but did not fall or hit his head. They assisted patient to the floor. Patient was recently discharged 05/07/19 from [facility]. He was discharged on lovenox shots twice per day d/t INR non therapeutic. According to his mother she had not been giving him the coumadin just the lovenox, but he did not receive lovenox this morning. Last dose lovenox  was 05/07/19 PM.  Per mother patient had been using a walker to ambulate since his hospitalization 1 month ago. Patient on 2L [place] 02 @ baseline.   Attending addendum: Called by Dr. Loreta Ave who spoke with the patient's mother who had arrived to the hospital.  She reports that he had been admitted to the [facility] for weakness of the right lower extremity but was treated for mostly pneumonia and reported that he been having trouble using the right leg and walking since Christmas. The acute changes morning was confirmed but history was very inconsistent from what Dr. Loreta Ave received and what was provided to [place] over the phone. ED  course:  CTH: no hemorrhage,  BP:129/76 BG: 192 CTA: occlussion of the proximal A2 left anteriror cerebral arterythrombus in right innominate artery that extends into the right common carotid artery; CTP: core: 125 Date last known well: 05/07/2018 Time last known well: 0900 tPA Given:no, d/t on  lovenox ( anti-coagulation dose) Modified Rankin: Rankin Score=1 NIHSS:15    INTERVAL HISTORY His nurse and Speech therapist is at bedside. He has profound dysarthria but does not appear to be aphasic. His weakness is improving. He is still on low-dose levo. Extubated. Pending MRI Monday (due to pacemaker)    OBJECTIVE Vitals:   05/10/19 0945 05/10/19 1000 05/10/19 1015 05/10/19 1030  BP: 126/71 121/70 120/75 127/84  Pulse: 61 (!) 105 63 62  Resp: (!) 22 (!) 22 17 20   Temp:      TempSrc:      SpO2: 96% 94% 91% 91%  Weight:      Height:        CBC:  Recent Labs  Lab 05/09/19 0504 05/10/19 0505  WBC 18.1* 16.3*  NEUTROABS 14.8* 13.8*  HGB 12.7* 11.7*  HCT 39.1 36.6*  MCV 89.9 90.1  PLT 370 313    Basic Metabolic Panel:  Recent Labs  Lab 05/08/19 2357 05/09/19 0112 05/10/19 0505  NA 137 138  --   K 3.9 3.7  --   CL 107 106  --   CO2 22 17*  --   GLUCOSE 145* 138*  --   BUN 11 10  --   CREATININE 0.82  0.78  --   CALCIUM 8.0* 7.8*  --   MG  --  2.1 2.0  PHOS  --  3.5 3.2    Lipid Panel:     Component Value Date/Time   CHOL 132 05/09/2019 0504   TRIG 157 (H) 05/09/2019 0504   HDL 34 (L) 05/09/2019 0504   CHOLHDL 3.9 05/09/2019 0504   VLDL 31 05/09/2019 0504   LDLCALC 67 05/09/2019 0504   HgbA1c:  Lab Results  Component Value Date   HGBA1C 6.9 (H) 05/09/2019   Urine Drug Screen: No results found for: LABOPIA, COCAINSCRNUR, LABBENZ, AMPHETMU, THCU, LABBARB  Alcohol Level No results found for: ETH  IMAGING  CT Code Stroke CTA Head W/WO contrast 05/08/2019   ADDENDUM: Upon further review, there is likely greater stenosis within the proximal left common  carotid artery than originally stated. This is on the basis of circumferential plaque or thrombus. Exact quantification of stenosis is difficult due to vessel tortuosity, but likely moderate/severe.  05/08/2019 IMPRESSION   CTA neck:  1. 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  2. 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 Thrombus extends into the proximal right common carotid artery with high-grade stenosis. There is a progressive decrease in this stenosis towards the distal CCA. There is abrupt caliber change of the cervical right ICA just distal to the bulb. New from prior examinations, the cervical ICA is markedly asymmetrically diminutive throughout the neck and through the siphon region. Sites of high-grade stenosis within the siphon region.  3. The left common and cervical internal carotid arteries are patent without significant stenosis. Circumferential plaque or thrombus within the proximal left common carotid artery.  4. Bilateral vertebral arteries patent within the neck without significant stenosis.  5. Extensive airspace disease within the partially imaged left lung apex suspicious for pneumonia.   CTA head:  1. The right ICA is markedly diminutive through the siphon region with sites of high-grade stenosis. Mixed plaque also results in mild/moderate stenosis of the supraclinoid right ICA.  2. Abrupt occlusion of the proximal A2 left anterior cerebral artery. There appears to be some reconstitution of the left ACA distally.  3. No other intracranial large vessel occlusion or proximal high-grade arterial stenosis is identified.   CT Code Stroke Cerebral Perfusion with contrast 05/08/2019 IMPRESSION:  125 mL region of hypoperfused parenchyma predominantly within the left ACA and ACA/MCA watershed territory, as well as right ACA/MCA watershed territory. The perfusion software does not  detect any core infarct. However, there were clear changes of infarction within the corpus callosum and left pericallosal gyrus on noncontrast head CT performed earlier the same day.   DG Chest Port 1 View 05/08/2019 IMPRESSION:  1. Interval intubation with endotracheal tube tip terminating between the thoracic inlet and carina. Nasogastric tube courses below the diaphragm with tip out of field of view.  2. Diffuse interstitial opacities throughout the left lung, improved compared to prior radiograph.   DG Chest Port 1 View 05/09/2019 IMPRESSION: Stable support apparatus. Stable diffuse reticular densities are noted in left lung concerning for inflammation, atelectasis or possibly edema.  CT Angio Chest/Abd/Pel for Dissection W and/or W/WO 05/08/2019 IMPRESSION:  Status post surgical repair of ascending thoracic aortic aneurysm as well as aortic valve repair. No definite evidence of acute dissection or aneurysm is seen involving the thoracic or abdominal aorta. There is noted thrombosis of the right innominate artery as noted  on prior CTA of the neck performed today. Stable diffuse lung opacity is noted concerning for pneumonia or scarring. Bilateral fat containing inguinal hernias are noted, right greater than left. Aortic Atherosclerosis (ICD10-I70.0) and Emphysema (ICD10-J43.9).  CT HEAD CODE STROKE WO CONTRAST 05/08/2019 IMPRESSION:  2.1 x 1.1 cm focus of abnormal hypodensity within the left aspect of the callosal body and possibly also involving the pericallosal gyrus on the left. Findings are new from prior examination 10/26/2018 and may reflect an acute infarct or other lesion. Consider brain MRI for further evaluation. 021 11:01   CT Head WO Contrast (limited) 05/10/2019 IMPRESSION:  Status post ultrasound guided access right common femoral artery for left-sided cervical and cerebral angiogram and attempted thrombectomy of left A2 occlusion, with 3 passes primary aspiration  and a single  pass of mechanical thrombectomy yielding no significant improvement in flow through the A2 segment.  Non flow limiting dissection was identified in the distal cervical segment.  MRI Brain WO Contrast - pending   IR Thrombectomy Attempt 05/08/19  Left A2 occlusion, remained occluded at the conclusion  CT shows no SAH at conclusion.  Staining of the infarction of left ACA territory  Non-flow limiting dissection at the distal left cervical ICA   Transthoracic Echocardiogram  05/09/2019 IMPRESSIONS  1. Left ventricular ejection fraction, by visual estimation, is 25 to 30%. The left ventricle has severely decreased function. There is no left ventricular hypertrophy.  2. Elevated left ventricular end-diastolic pressure.  3. Left ventricular diastolic parameters are consistent with Grade II diastolic dysfunction (pseudonormalization).  4. The left ventricle demonstrates global hypokinesis.  5. Global right ventricle has normal systolic function.The right ventricular size is normal. No increase in right ventricular wall thickness.  6. Left atrial size was normal.  7. Right atrial size was normal.  8. The mitral valve is normal in structure. Mild mitral valve regurgitation. No evidence of mitral stenosis.  9. The tricuspid valve is normal in structure. 10. Aortic valve regurgitation is not visualized. Mild aortic valve stenosis. 11. Mechanical prosthesis in the aortic valve position. 12. The pulmonic valve was normal in structure. Pulmonic valve regurgitation is not visualized. 13. Normal pulmonary artery systolic pressure. 14. The inferior vena cava is normal in size with greater than 50% respiratory variability, suggesting right atrial pressure of 3 mmHg. 15. When compared to the prior study from 05/01/2018 LVEF has decreased from 55-60% to 25-30% with diffuse hypokinesis. Transaortic gradients across the mechanical aortic valve are unchanged.   ECG - SR rate 62 BPM. (See cardiology reading  for complete details)   PHYSICAL EXAM Blood pressure 127/84, pulse 62, temperature 98.4 F (36.9 C), temperature source Oral, resp. rate 20, height 5\' 11"  (1.803 m), weight 106 kg, SpO2 91 %.   Sittig  in bed. Alert. Can follow 3-step commands. Profound dysarthria. Left gaze preference but crosses the midline. PERRL. hearing intact to voice, facial droop on right, tongue deviated to the right. , sensation intact in the face and limbs, moving left arm and hand spontaneously and purposefully, can lift right arm and leg off of the best with minimal drift. Withdraws x 4. States sensation intact and equal in all limbs.    ASSESSMENT/PLAN [name] is a 54 y.o. male with history of prior stroke with residual dysarthria (ambulates with a walker), anxiety / depression, DM, PAF, ongoing tobacco use, CAD / CABG, type aortic dissection in 2014 with repair that was complicated and involved a brachiocephalic graft to the left carotid  as the dissection extended into the brachiocephalic and left carotid,  aortic stenosis status post mechanical aortic valve; on 2L [place] 02 @ baseline presented to the ED as a code stroke for sudden onset of leftward gaze, aphasia, and right-sided flaccid paralysis. He had been [facility] for weakness of the right lower extremity but was treated for mostly pneumonia and reported that he been having trouble using the right leg and walking since Christmas. Since D/C he had not been getting coumadin just the lovenox, but he did not receive lovenox this morning. Last dose lovenox  was 05/07/19 PM. He did not receive IV t-PA due to Lovenox.  IR Thrombectomy Attempt - Left A2 occlusion, remained occluded at the conclusion  Stroke: Rt CVA - occlussion of the proximal A2 left anterior cerebral arterythrombus in right innominate artery that extends into the right common carotid artery; Embolic - source - mechanical valve - PAF - INR not therapeutic  Code  Stroke CT Head - 2.1 x 1.1 cm focus of abnormal hypodensity within the left aspect of the callosal body and possibly also involving the pericallosal gyrus on the left.  CT head - 05/10/2019 - Non flow limiting dissection was identified in the distal cervical segment.  MRI head - pending (pacemaker - MRI Monday with pacer rep)  MRA head - not ordered  CTA Head - The right ICA is markedly diminutive through the siphon region with sites of high-grade stenosis. Mixed plaque also results in mild/moderate stenosis of the supraclinoid right ICA. Abrupt occlusion of the proximal A2 left anterior cerebral artery. There appears to be some reconstitution of the left ACA distally.   CTA Neck - there is a greater stenosis within the proximal left common carotid artery than originally stated - likely moderate/severe. Thrombosis of the right innominate artery. Thrombus extends into the proximal right common carotid artery with high-grade stenosis. Sites of high-grade stenosis within the siphon region.   CT Perfusion - 125 mL region of hypoperfused parenchyma predominantly within the left ACA and ACA/MCA watershed territory, as well as right ACA/MCA watershed territory.  Carotid Doppler - CTA neck performed - carotid dopplers not indicated.  2D Echo - EF 25 to 30%.  No cardiac source of emboli identified.   [employer] Corona Virus 2 - negative  LDL - 67  HgbA1c - 6.9  UDS - not ordered  VTE prophylaxis - Heparin IV Diet  Diet Order            Diet NPO time specified  Diet effective now              aspirin 81 mg daily and Lovenox (warfarin)prior to admission, now on aspirin 325 mg and Heparin IV  Patient will be counseled to be compliant with his antithrombotic medications  Ongoing aggressive stroke risk factor management  Therapy recommendations:  pending  Disposition:  Pending  Hypertension  Home BP meds: Aldactone and Entresto  Current BP meds:   SBP - low -> Levophed . Permissive  hypertension (OK if < 220/120) but gradually normalize in 5-7 days  . Long-term BP goal normotensive  Hyperlipidemia  Home Lipid lowering medication: Crestor 40 mg daily  LDL 69, goal < 70  Current lipid lowering medication: none / NPO   Continue statin at discharge  Diabetes  Home diabetic meds: Jardiance  Current diabetic meds: SSI   HgbA1c 6.9, goal < 7.0 Recent Labs    05/09/19 2323 05/10/19 0324 05/10/19 0738  GLUCAP 101*  90 92    Other Stroke Risk Factors  Cigarette smoker will be advised to stop smoking  ETOH use, advised to drink no more than 1 alcoholic beverage per day.  Obesity, Body mass index is 32.59 kg/m., recommend weight loss, diet and exercise as appropriate   Hx stroke/TIA  Family hx stroke (father)  Coronary artery disease - CABG  Mechanical aortic valve (anticaogulation)  Paroxysmal atrial fibrillation (anticoagulation)  Congestive Heart Failure  Other Active Problems  Mechanical aortic valve (anticaogulation) Vascular surgery has seen and is available as needed.  Paroxysmal atrial fibrillation (anticoagulation)  LLL Pneumonia - Vancomycin started 05/08/19 (one dose only)  DNR  Intubated / NPO  Leukocytosis - 18.1->16.3 (afebrile - 98.4))  Hypotension - levophed  EF 25 to 30% - (notes reviewed - Pt saw cardiologist Dr Desma Maxim [facility] 04/27/19 at which time E was notes to be 20%)   Hospital day # 2  This is a patient with likely embolic strokes in the setting of subtherapeutic INR.  He has an extremely complicated history.  At this time we are keeping him on IV heparin, pending MRI of the brain and based on those results we will likely restart warfarin.  This patient is critically ill and at significant risk of neurological worsening, death and care requires constant monitoring of vital signs, hemodynamics,respiratory and cardiac monitoring,review of multiple databases, neurological assessment, discussion with  family, other specialists and medical decision making of high complexity.I  I spent 30 minutes of neurocritical care time in the care of this patient.     To contact Stroke Continuity provider, please refer to [email or web address]. After hours, contact General Neurology

## 2019-05-10 NOTE — Evaluation (Signed)
Speech Language Pathology Evaluation Patient Details Name: Jon [name] MRN: [id number] DOB: 05-17-1965 Today's Date: 05/10/2019 Time: 7829-5621 SLP Time Calculation (min) (ACUTE ONLY): 35 min  Problem List:  Patient Active Problem List   Diagnosis Date Noted  . Stroke (cerebrum) (HCC) 05/08/2019  . Acute ischemic stroke (HCC) 05/08/2019  . Status post aortic valve replacement 02/23/2019  . Pacemaker Mc[employer]aine Jude device implanted in summer 2020 02/23/2019  . Erectile dysfunction 12/10/2017  . Nontraumatic multiple localized intracerebral hemorrhages (HCC) 10/16/2017  . Dysarthria 09/10/2017  . VBI (vertebrobasilar insufficiency) 09/10/2017  . Cholelithiasis 12/20/2016  . Current use of long term anticoagulation 12/20/2016  . Late effects of CVA (cerebrovascular accident) 08/08/2015  . Paroxysmal atrial fibrillation (HCC) 08/08/2015  . Presence of prosthetic heart valve 08/08/2015  . 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 Idiopathic peripheral neuropathy 07/14/2013  . Cerebral thrombosis with cerebral infarction (HCC) 07/14/2013  . Generalized constriction of visual field 10/15/2012  . Seroma, postoperative 06/13/2012  . CVA (cerebral infarction) 05/11/2012  . Cerebral infarction (HCC) 05/11/2012  . Stroke, embolic (HCC) 05/07/2012    Class: Acute  . History of CVA (cerebrovascular accident) 05/07/2012  . HCAP (healthcare-associated pneumonia) 05/06/2012  . Syncope 05/06/2012  . Anxiety 05/06/2012  . Aortic stenosis 05/06/2012  . Aortic aneurysm and dissection (HCC) 05/06/2012  . Thoracic aortic aneurysm (HCC) 05/06/2012  . Aortic valve disorder 05/06/2012  . Dissecting aortic aneurysm (HCC) 05/06/2012  . Aneurysm of thoracic aorta (HCC) 05/06/2012   Past Medical History:  Past Medical History:  Diagnosis Date  . Aneurysm of thoracic aorta (HCC) 05/06/2012   IMO routine update  IMO routine update  . Anxiety    sees Dr. Renae Fickle  . Aortic aneurysm and dissection (HCC) 05/06/2012  . Aortic stenosis    mechanical AVR 04/22/12  . Aortic valve disorder 05/06/2012  . 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 Bicuspid aortic valve   . Cerebral infarction (HCC) 05/11/2012  . Cerebral thrombosis with cerebral infarction (HCC) 07/14/2013  . Cholelithiasis 12/20/2016  . Current use of long term anticoagulation 12/20/2016  . CVA (cerebral infarction)    occipital CVA 12/13  . Dissecting aortic aneurysm (HCC) 05/06/2012  . Dysarthria 09/10/2017  . Dysrhythmia    sees Dr. Geronimo Running, [facility] cardiology in [place]  . Generalized constriction of visual field 10/15/2012   IMOUPDATE IMOUPDATE  . 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 GERD (gastroesophageal reflux disease)   . HCAP (healthcare-associated pneumonia) 05/06/2012  . Headache(784.0)    "due to vision problem"  . History of CVA (cerebrovascular accident) 05/07/2012  . Hypothyroidism   . Idiopathic peripheral neuropathy 07/14/2013  . Late effects of CVA (cerebrovascular accident) 08/08/2015  . Nontraumatic multiple localized intracerebral hemorrhages (HCC) 10/16/2017  . Paroxysmal atrial fibrillation (HCC) 08/08/2015   On warfarin On warfarin  . Presence of prosthetic heart valve 08/08/2015  . Shortness of breath   . Stroke (HCC)    x3  . Stroke, embolic (HCC) 05/07/2012  . Syncope 05/06/2012  . Thoracic aortic aneurysm (HCC)    replacement aortic root 04/22/12  . VBI (vertebrobasilar insufficiency) 09/10/2017   Past Surgical History:  Past Surgical History:  Procedure Laterality Date  . APPLICATION OF WOUND VAC  06/11/2012   Procedure: APPLICATION OF WOUND VAC;  Surgeon: [name], MD;  Location: [facility] OR;  Service: Vascular;  Laterality: Right;  . CARDIAC CATHETERIZATION    .  CHOLECYSTECTOMY    . CORONARY ARTERY BYPASS GRAFT  04/22/2012   Procedure: CORONARY ARTERY  BYPASS GRAFTING (CABG);  Surgeon: [name], MD;  Location: [facility] OR;  Service: Open Heart Surgery;  Laterality: N/A;  times one with right greater saphenous vein graft  . I & D EXTREMITY  06/11/2012   Procedure: IRRIGATION AND DEBRIDEMENT EXTREMITY;  Surgeon: [name], MD;  Location: [facility] OR;  Service: Vascular;  Laterality: Right;  . INTRAOPERATIVE TRANSESOPHAGEAL ECHOCARDIOGRAM  04/22/2012   Procedure: INTRAOPERATIVE TRANSESOPHAGEAL ECHOCARDIOGRAM;  Surgeon: [name], MD;  Location: [facility] OR;  Service: Open Heart Surgery;  Laterality: N/A;  . IR CT HEAD LTD  05/08/2019  . IR PERCUTANEOUS ART THROMBECTOMY/INFUSION INTRACRANIAL INC DIAG ANGIO  05/08/2019  . IR US GUIDE VASC ACCESS RIGHT  05/08/2019  . ROTATOR CUFF REPAIR     12 years ago, Left  . THORACIC AORTIC ANEURYSM REPAIR  04/22/2012   Procedure: THORACIC ASCENDING ANEURYSM REPAIR (AAA);  Surgeon: [name], MD;  Location: [facility] OR;  Service: Open Heart Surgery;  Laterality: N/A;   HPI:  54 yo admitted with right weakness and left gaze with aphasia with acute left ACA anterior territory CVA s/p revascularization. VDRF 1/1-1/2. PMhx: dysarthria, aortic dissection s/p repair, AVR, GERD, CVA x 3. Prior CVA resulted in verbosity, cognitive impairment including impulsivity, mild dysphagia. In 2014 MBS recommended regualr thin with a chin tuck.    Assessment / Plan / Recommendation Clinical Impression  Pt demonstrates a severe dysarthria with poor regulation of fine motor movement; language and comprehension are in tact though mild cognitive impairment seems to persist from prior CVA (impulsivity). There is prolonged duration of vowels, hypernasality, loud volume with absent stress and prosody. Pt can hit appropriate articulatory contacts so speech is intelligible with words and short phrases if context is known. Pt struggles to complete sentences; needs confirmation of comprehension and then can start back at break and  finish sentence. He is using some good compensatory srategies; repeating single words with overarticulation, using hand gestures. Excellent potential for reovery, pt is eager for CIR as he visited there before and had good results. Strongly recommend intensive speech therapy at CIR.     SLP Assessment  SLP Recommendation/Assessment: Patient needs continued Speech Lanaguage Pathology Services SLP Visit Diagnosis: Dysarthria and anarthria (R47.1)    Follow Up Recommendations  Inpatient Rehab    Frequency and Duration min 2x/week  2 weeks      SLP Evaluation Cognition  Overall Cognitive Status: Difficult to assess Orientation Level: Other (comment)(aphasic)       Comprehension  Auditory Comprehension Overall Auditory Comprehension: Appears within functional limits for tasks assessed Yes/No Questions: Within Functional Limits Commands: Within Functional Limits Conversation: Complex Interfering Components: Ecologist Discrimination: Within Function Limits Reading Comprehension Reading Status: Within funtional limits    Expression Expression Primary Mode of Expression: Verbal Verbal Expression Overall Verbal Expression: Appears within functional limits for tasks assessed Written Expression Dominant Hand: Right Written Expression: Not tested   Oral / Motor  Oral Motor/Sensory Function Overall Oral Motor/Sensory Function: Severe impairment Facial ROM: Reduced right;Suspected CN VII (facial) dysfunction Facial Symmetry: Abnormal symmetry right;Suspected CN VII (facial) dysfunction Facial Strength: Reduced right;Suspected CN VII (facial) dysfunction Facial Sensation: Within Functional Limits Lingual ROM: Reduced right;Suspected CN XII (hypoglossal) dysfunction Lingual Symmetry: Abnormal symmetry right;Suspected CN XII (hypoglossal) dysfunction Lingual Strength: Reduced Lingual Sensation: Within Functional Limits Velum: Impaired  right;Suspected CN X (Vagus) dysfunction Mandible: Within Functional Limits  Motor Speech Overall Motor Speech: Impaired Respiration: Within functional limits Phonation: Normal Resonance: Hypernasality Articulation: Impaired(rate, prosody, volume impaired. ) Level of Impairment: Word Intelligibility: Intelligibility reduced Word: 50-74% accurate Phrase: 50-74% accurate Sentence: 25-49% accurate Conversation: 0-24% accurate Motor Planning: Impaired Level of Impairment: Word Motor Speech Errors: Aware;Consistent   GO                   Herbie Baltimore, MA CCC-SLP  Acute Rehabilitation Services Pager [phone] Office [phone]  Lynann Beaver 05/10/2019, 10:04 AM

## 2019-05-10 NOTE — Progress Notes (Signed)
Modified Barium Swallow Progress Note  Patient Details  Name: Jon [name] MRN: [id number] Date of Birth: 06-02-1965  Today's Date: 05/10/2019  Modified Barium Swallow completed.  Full report located under Chart Review in the Imaging Section.  Brief recommendations include the following:  Clinical Impression  Pt demonstrates a primary oral dysphagia with resulting pharyngeal deficits related to timing, but not strength. Pt has right labial and lingual weakness, and on fluro there is obviously decreased lingual coordination and velopharyngeal elevation and coordination. Due to weakness there is anterior spillage, particularly at the moment of swallow initiation due to escape of pressure through the oral cavity. Pt requries intermittent explicit verbal cues for how to move his tongue to initiate oral transit which aide in more reflexive lingual pumping. Pumping does result in piecemeal, uncontrolled spillage with multiple reflexive swallow per bolus. Thin and nectar thick liquids spill and are aspirated before the swallow. Honey thick liquids as well even had come frank penetration with immediate ejection with a throat clear. A chin tuck helps increase time for swallow initaition and pt is familiar with this strategy from prior CVA. Study was quite limited as pt seemed very overwhelmed and uncomfortable though he did participate. Recommend pt initiate a puree dys 1 diet with honey thick liquids with a chin tuck and spoon feeding preferred at this time.    Swallow Evaluation Recommendations       SLP Diet Recommendations: Dysphagia 1 (Puree) solids;Honey thick liquids           Supervision: Staff to assist with self feeding   Compensations: Slow rate;Small sips/bites;Chin tuck   Postural Changes: Remain semi-upright after after feeds/meals (Comment)          Jon Ditty, MA CCC-SLP  Acute Rehabilitation Services Pager [phone] Office [phone]   Jon [name] 05/10/2019,2:00 PM

## 2019-05-10 NOTE — Consult Note (Addendum)
NAME:  Jon [name], MRN:  [id number], DOB:  19-Sep-1965, LOS: 2 ADMISSION DATE:  05/08/2019, CONSULTATION DATE:  05/08/2019  REFERRING MD:  Dr Wilford Corner of neuro, CHIEF COMPLAINT:  Stroke on vent. Recent admit at [facility] for pneumonia   Brief History   See below.  History gained from review of the chart, talking to the neurologist  History of present illness   Sirron Francesconi -54 year old male with complex medical history past medical history of prior stroke with residual dysarthria, type a aortic dissection by Dr. Edwina Barth in 2014 with repair that was complicated and a status post brachiocephalic graft to the left carotid, aortic stenosis status post mechanical heart valve on anticoagulation. Also has PAF  Patient has been using a walker to ambulate since her hospitalization a month ago.  Also on 2 L nasal cannula at baseline for unclear reasons.  Was admitted to [facility] sometime few to several days ago for right-sided weakness was was treated for pneumonia.  And since that admission has been having trouble using the right leg and walking.  Is around May 01, 2019.  He was discharged yesterday on therapeutic Lovenox with last dose May 07, 2019.  His most recent discharge date from [facility] was May 07, 2019.  Then on May 07, 2018 1 in the morning at 9 AM he had sudden onset of aphasia and right-sided weakness with slumping.  It was noted that his Coumadin was subtherapeutic but he was on Lovenox.  He presented to [facility] emergency room with NIHSS stroke score of 13.  CT work-up showed innominate graft occlusion which is new from CT chest April 30, 2019 and also CTA with left anterior cerebral artery occlusion A1 region with a small infarct.  Patient was intubated for re- vascularization and status post thrombectomy that was successful versus nonsuccessful [unclear from notes].  Current blood pressure goal is systolic blood pressure 140-180.  Neurology  feels this anticoagulation because of his heart valve needs to be restarted.  He was intubated for the procedure.  He is being brought back post procedure intubated.  Pulmonary has been called for management of medical issues and postop respiratory failure.  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 (07/14/2013), Bicuspid aortic valve, Cerebral infarction (HCC) (05/11/2012), Cerebral thrombosis with cerebral infarction (HCC) (07/14/2013), Cholelithiasis (12/20/2016), Current use of long term anticoagulation (12/20/2016), CVA (cerebral infarction), Dissecting aortic aneurysm (HCC) (05/06/2012), Dysarthria (09/10/2017), Dysrhythmia, Generalized constriction of visual field (10/15/2012), 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), GERD (gastroesophageal reflux disease), HCAP (healthcare-associated pneumonia) (05/06/2012), Headache(784.0), History of CVA (cerebrovascular accident) (05/07/2012), Hypothyroidism, Idiopathic peripheral neuropathy (07/14/2013), Late effects of CVA (cerebrovascular accident) (08/08/2015), Nontraumatic multiple localized intracerebral hemorrhages (HCC) (10/16/2017), Paroxysmal atrial fibrillation (HCC) (08/08/2015), Presence of prosthetic heart valve (08/08/2015), Shortness of breath, Stroke [facility]), Stroke, embolic (HCC) (05/07/2012), Syncope (05/06/2012), Thoracic aortic aneurysm (HCC), and VBI (vertebrobasilar insufficiency) (09/10/2017).   reports that he has been smoking cigarettes. He started smoking about 7 years ago. He has been smoking about 2.50 packs per day. He has never used smokeless tobacco.  Past Surgical History:  Procedure Laterality Date  . APPLICATION OF WOUND VAC  06/11/2012   Procedure: APPLICATION OF WOUND VAC;  Surgeon: [name], MD;   Location: [facility] OR;  Service: Vascular;  Laterality: Right;  .  CARDIAC CATHETERIZATION    . CHOLECYSTECTOMY    . CORONARY ARTERY BYPASS GRAFT  04/22/2012   Procedure: CORONARY ARTERY BYPASS GRAFTING (CABG);  Surgeon: [name], MD;  Location: [facility] OR;  Service: Open Heart Surgery;  Laterality: N/A;  times one with right greater saphenous vein graft  . I & D EXTREMITY  06/11/2012   Procedure: IRRIGATION AND DEBRIDEMENT EXTREMITY;  Surgeon: [name], MD;  Location: [facility] OR;  Service: Vascular;  Laterality: Right;  . INTRAOPERATIVE TRANSESOPHAGEAL ECHOCARDIOGRAM  04/22/2012   Procedure: INTRAOPERATIVE TRANSESOPHAGEAL ECHOCARDIOGRAM;  Surgeon: [name], MD;  Location: [facility] OR;  Service: Open Heart Surgery;  Laterality: N/A;  . IR CT HEAD LTD  05/08/2019  . IR PERCUTANEOUS ART THROMBECTOMY/INFUSION INTRACRANIAL INC DIAG ANGIO  05/08/2019  . IR US GUIDE VASC ACCESS RIGHT  05/08/2019  . ROTATOR CUFF REPAIR     12 years ago, Left  . THORACIC AORTIC ANEURYSM REPAIR  04/22/2012   Procedure: THORACIC ASCENDING ANEURYSM REPAIR (AAA);  Surgeon: [name], MD;  Location: [facility] OR;  Service: Open Heart Surgery;  Laterality: N/A;    Allergies  Allergen Reactions  . Hydrocodone-Acetaminophen Other (See Comments)    PT MOTHER DOES NOT REMEMBER REACTION  Only use in Emergency   . Hydromorphone Itching and Rash       . Other Other (See Comments)    ALL NARCOTICS-ITCHING, RASH   . Oxycodone Other (See Comments)    Unknown reaction  . Penicillins Rash    Did it involve swelling of the face/tongue/throat, SOB, or low BP? Unknown Did it involve sudden or severe rash/hives, skin peeling, or any reaction on the inside of your mouth or nose? Unknown Did you need to seek medical attention at a hospital or doctor's office? Unknown When did it last happen?unknown If all above answers are "NO", may proceed with cephalosporin use.  . Dilaudid [Hydromorphone Hcl] Itching and Rash      There is no immunization history on file for this patient.  Family History  Problem Relation Age of Onset  . Lung disease Mother   . Diabetes Father   . Peripheral Artery Disease Father   . Hypertension Father   . CAD Father   . Heart disease Father   . Stroke Father   . Cancer Maternal Grandfather   . Heart disease Paternal Grandfather      Current Facility-Administered Medications:  .  acetaminophen (TYLENOL) tablet 650 mg, 650 mg, Oral, Q4H PRN **OR** acetaminophen (TYLENOL) 160 MG/5ML solution 650 mg, 650 mg, Per Tube, Q4H PRN **OR** acetaminophen (TYLENOL) suppository 650 mg, 650 mg, Rectal, Q4H PRN, Milon Dikes, MD .  aspirin suppository 300 mg, 300 mg, Rectal, Daily, Naomie Dean B, MD, 300 mg at 05/09/19 1550 .  aspirin tablet 325 mg, 325 mg, Oral, Daily **OR** [DISCONTINUED] aspirin chewable tablet 324 mg, 324 mg, Per Tube, Daily, Gilmer Mor, DO .  chlorhexidine (PERIDEX) 0.12 % solution 15 mL, 15 mL, Mouth Rinse, BID, Naomie Dean B, MD, 15 mL at 05/09/19 2111 .  Chlorhexidine Gluconate Cloth 2 % PADS 6 each, 6 each, Topical, Daily, Kalman Shan, MD, 6 each at 05/09/19 1311 .  clevidipine (CLEVIPREX) infusion 0.5 mg/mL, 0-21 mg/hr, Intravenous, Continuous, Wagner, Jaime, DO .  fentaNYL (SUBLIMAZE) injection 25-100 mcg, 25-100 mcg, Intravenous, Q2H PRN, Kalman Shan, MD, 50 mcg at 05/08/19 2226 .  heparin ADULT infusion 100 units/mL (25000 units/254mL sodium chloride 0.45%), 1,450 Units/hr, Intravenous, Continuous, Silvana Newness, RPH, Last  Rate: 14.5 mL/hr at 05/10/19 0700, 1,450 Units/hr at 05/10/19 0700 .  insulin aspart (novoLOG) injection 2-6 Units, 2-6 Units, Subcutaneous, Q4H, Brand Males, MD, 2 Units at 05/09/19 1145 .  MEDLINE mouth rinse, 15 mL, Mouth Rinse, q12n4p, Sarina Ill B, MD, 15 mL at 05/09/19 1550 .  midazolam (VERSED) injection 1-4 mg, 1-4 mg, Intravenous, Q2H PRN, Brand Males, MD, 2 mg at 05/09/19 0237 .   norepinephrine (LEVOPHED) 4mg  in 233mL premix infusion, 0-40 mcg/min, Intravenous, Titrated, Ramaswamy, Murali, MD, Last Rate: 7.5 mL/hr at 05/10/19 0732, 2 mcg/min at 05/10/19 0732 .  oxymetazoline (AFRIN) 0.05 % nasal spray 1 spray, 1 spray, Each Nare, BID, Corrie Mckusick, DO, 1 spray at 05/09/19 2300 .  propofol (DIPRIVAN) 1000 MG/100ML infusion, 5-80 mcg/kg/min, Intravenous, Titrated, Brand Males, MD, Stopped at 05/09/19 0850 .  senna-docusate (Senokot-S) tablet 1 tablet, 1 tablet, Oral, QHS PRN, Amie Portland, MD   Significant Hospital Events   05/08/2019 - admit  Consults:  05/08/2019 - ccm January is 2021-interventional radiology May 08, 2019-vascular service  Procedures:  05/08/2019 - ETT  Significant Diagnostic Tests:  *x  Micro Data:  Tracheal aspirate May 08, 2019 Covid  Antimicrobials:  x  Interim history/subjective:  Extubated no acute distress  Objective   Blood pressure 129/68, pulse 62, temperature 98.4 F (36.9 C), temperature source Oral, resp. rate (!) 21, height 5\' 11"  (1.803 m), weight 106 kg, SpO2 98 %.        Intake/Output Summary (Last 24 hours) at 05/10/2019 0956 Last data filed at 05/10/2019 0800 Gross per 24 hour  Intake 1030.87 ml  Output 1800 ml  Net -769.13 ml   Filed Weights   05/08/19 1000 05/08/19 1630  Weight: 106 kg 106 kg    General: Well-nourished well-developed male no acute distress HEENT: No JVD or lymphadenopathy is appreciated pupils equal react light tracks and follows Neuro: Expressive aphasia noted but follows commands CV: Sounds are distant PULM: Diminished in the bases  GI: soft, bsx4 active  Extremities: warm/dry,  edema  Skin: no rashes or lesions   Resolved Hospital Problem list   x  Assessment & Plan:   ASSESSMENT / PLAN:  PULMONARY A:  Acute respiratory failure of following stroke    P:   Liberated from mechanical ventilatory support Pulmonary critical care will be available as  needed   A:   Left-sided anterior cerebral artery circulation stroke status post thrombectomy and revascularization May 08, 2019 P:   Per neurology     A:   History of aortic dissection type a in 2014 with complex repair Dr. Roxan Hockey   Goal blood pressure between 518-841 systolic blood pressure.  Currently hypotensive -probably due to Precedex/volume depletion  P:  Per neurology     A: ?  History of coronary artery disease Status post aortic prostatic valve on anticoagulation  P: Per primary   A: History of proximal atrial fibrillation   P: Per primary   A:   History of pneumonia not otherwise specified discharge May 07, 2019 from [facility]:   Monitor culture data   A:  At risk for AKI but currently creatinine normal Lab Results  Component Value Date   CREATININE 0.78 05/09/2019   CREATININE 0.82 05/08/2019   CREATININE 0.90 05/08/2019    P:  Monitor   A:  At risk for electrolyte imbalance P: Monitor and replete electrolytes    A:   Mild transaminitis  P:   Continue to monitor   A:  Hemoglobin normal but at risk for anemia of critical illness   P:  Transfuse per protocol    A:   At risk for hypoglycemia P:   Sliding-scale insulin protocol  MSK/DERM At risk for sacral decub  Plan  Increase mobility    Best practice:  Diet: N.p.o. 05/10/2019 swallow evaluation is planned Pain/Anxiety/Delirium protocol (if indicated): Dipper Zenaida Niece if needed with fentanyl and Versed as needed VAP protocol (if indicated): Yes DVT prophylaxis: IV systemic anticoagulation because of PAF and mechanical heart valve GI prophylaxis: PPI Glucose control: SSI Mobility: Bedrest Code Status: Full code Family Communication: By primary service neurology Disposition [id number] ICU bed         LABS    PULMONARY Recent Labs  Lab 05/08/19 1047 05/08/19 1810  PHART  --  7.345*  PCO2ART  --  46.7  PO2ART  --  103.0  HCO3   --  25.7  TCO2 23 27  O2SAT  --  98.0    CBC Recent Labs  Lab 05/08/19 1041 05/08/19 1810 05/09/19 0504 05/10/19 0505  HGB 14.7 12.6* 12.7* 11.7*  HCT 45.1 37.0* 39.1 36.6*  WBC 16.6*  --  18.1* 16.3*  PLT 357  --  370 313    COAGULATION Recent Labs  Lab 05/08/19 1041 05/09/19 0504  INR 1.4* 1.5*    CARDIAC  No results for input(s): TROPONINI in the last 168 hours. No results for input(s): PROBNP in the last 168 hours.   CHEMISTRY Recent Labs  Lab 05/08/19 1041 05/08/19 1047 05/08/19 1810 05/08/19 2357 05/09/19 0112 05/10/19 0505  NA 136 136 136 137 138  --   K 4.3 4.1 4.5 3.9 3.7  --   CL 102 102  --  107 106  --   CO2 23  --   --  22 17*  --   GLUCOSE 202* 192*  --  145* 138*  --   BUN 19 18  --  11 10  --   CREATININE 1.02 0.90  --  0.82 0.78  --   CALCIUM 8.5*  --   --  8.0* 7.8*  --   MG  --   --   --   --  2.1 2.0  PHOS  --   --   --   --  3.5 3.2   Estimated Creatinine Clearance: 132.3 mL/min (by C-G formula based on SCr of 0.78 mg/dL).   LIVER Recent Labs  Lab 05/08/19 1041 05/09/19 0112 05/09/19 0504  AST 47* 34  --   ALT 81* 63*  --   ALKPHOS 123 116  --   BILITOT 1.1 0.6  --   PROT 6.1* 5.3*  --   ALBUMIN 3.1* 2.6*  --   INR 1.4*  --  1.5*     INFECTIOUS Recent Labs  Lab 05/08/19 2030 05/09/19 0107 05/09/19 0112 05/10/19 0505  LATICACIDVEN  --  1.0  --   --   PROCALCITON 0.11  --  <0.10 <0.10     ENDOCRINE CBG (last 3)  Recent Labs    05/09/19 2323 05/10/19 0324 05/10/19 0738  GLUCAP 101* 90 92    Pulmonary critical care will sign off at this time.   Brett Canales Jt Brabec ACNP Acute Care Nurse Practitioner [facility] Pulmonary/Critical Care Please consult Amion 05/10/2019, 9:56 AM

## 2019-05-11 ENCOUNTER — Inpatient Hospital Stay (HOSPITAL_COMMUNITY): Payer: Medicaid Other

## 2019-05-11 LAB — CBC WITH DIFFERENTIAL/PLATELET
Abs Immature Granulocytes: 0.16 10*3/uL — ABNORMAL HIGH (ref 0.00–0.07)
Basophils Absolute: 0 10*3/uL (ref 0.0–0.1)
Basophils Relative: 0 %
Eosinophils Absolute: 0.3 10*3/uL (ref 0.0–0.5)
Eosinophils Relative: 2 %
HCT: 34.3 % — ABNORMAL LOW (ref 39.0–52.0)
Hemoglobin: 11.1 g/dL — ABNORMAL LOW (ref 13.0–17.0)
Immature Granulocytes: 1 %
Lymphocytes Relative: 7 %
Lymphs Abs: 1.1 10*3/uL (ref 0.7–4.0)
MCH: 28.7 pg (ref 26.0–34.0)
MCHC: 32.4 g/dL (ref 30.0–36.0)
MCV: 88.6 fL (ref 80.0–100.0)
Monocytes Absolute: 1.2 10*3/uL — ABNORMAL HIGH (ref 0.1–1.0)
Monocytes Relative: 8 %
Neutro Abs: 12.1 10*3/uL — ABNORMAL HIGH (ref 1.7–7.7)
Neutrophils Relative %: 82 %
Platelets: 294 10*3/uL (ref 150–400)
RBC: 3.87 MIL/uL — ABNORMAL LOW (ref 4.22–5.81)
RDW: 15.6 % — ABNORMAL HIGH (ref 11.5–15.5)
WBC: 14.8 10*3/uL — ABNORMAL HIGH (ref 4.0–10.5)
nRBC: 0 % (ref 0.0–0.2)

## 2019-05-11 LAB — GLUCOSE, CAPILLARY
Glucose-Capillary: 113 mg/dL — ABNORMAL HIGH (ref 70–99)
Glucose-Capillary: 131 mg/dL — ABNORMAL HIGH (ref 70–99)
Glucose-Capillary: 181 mg/dL — ABNORMAL HIGH (ref 70–99)
Glucose-Capillary: 90 mg/dL (ref 70–99)
Glucose-Capillary: 90 mg/dL (ref 70–99)

## 2019-05-11 LAB — MAGNESIUM: Magnesium: 1.9 mg/dL (ref 1.7–2.4)

## 2019-05-11 LAB — PHOSPHORUS: Phosphorus: 3.2 mg/dL (ref 2.5–4.6)

## 2019-05-11 LAB — HEPARIN LEVEL (UNFRACTIONATED): Heparin Unfractionated: 0.37 IU/mL (ref 0.30–0.70)

## 2019-05-11 MED ORDER — WARFARIN SODIUM 5 MG PO TABS
5.0000 mg | ORAL_TABLET | Freq: Once | ORAL | Status: AC
  Administered 2019-05-11: 5 mg via ORAL
  Filled 2019-05-11: qty 1

## 2019-05-11 MED ORDER — PANTOPRAZOLE SODIUM 40 MG PO TBEC: 40.0000 mg | DELAYED_RELEASE_TABLET | Freq: Every day | ORAL | Status: DC

## 2019-05-11 MED ORDER — WARFARIN - PHARMACIST DOSING INPATIENT: Freq: Every day | Status: DC

## 2019-05-11 MED ORDER — ROSUVASTATIN CALCIUM 20 MG PO TABS
40.0000 mg | ORAL_TABLET | Freq: Every day | ORAL | Status: DC
  Administered 2019-05-11 – 2019-05-12 (×2): 40 mg via ORAL
  Filled 2019-05-11 (×3): qty 2

## 2019-05-11 MED ORDER — PANTOPRAZOLE SODIUM 40 MG PO PACK
40.0000 mg | PACK | Freq: Every day | ORAL | Status: DC
  Administered 2019-05-11: 40 mg
  Filled 2019-05-11: qty 20

## 2019-05-11 MED ORDER — ASPIRIN 81 MG PO CHEW
81.0000 mg | CHEWABLE_TABLET | Freq: Every day | ORAL | Status: DC
  Administered 2019-05-11 – 2019-05-12 (×2): 81 mg via ORAL
  Filled 2019-05-11 (×3): qty 1

## 2019-05-11 MED ORDER — SODIUM CHLORIDE 0.9 % IV SOLN: INTRAVENOUS | Status: DC

## 2019-05-11 MED ORDER — GABAPENTIN 600 MG PO TABS
300.0000 mg | ORAL_TABLET | Freq: Three times a day (TID) | ORAL | Status: DC
  Administered 2019-05-12 – 2019-05-13 (×3): 300 mg
  Filled 2019-05-11 (×8): qty 0.5

## 2019-05-11 MED ORDER — GABAPENTIN 250 MG/5ML PO SOLN
300.0000 mg | Freq: Three times a day (TID) | ORAL | Status: DC
  Filled 2019-05-11 (×2): qty 6

## 2019-05-11 MED ORDER — TOPIRAMATE 25 MG PO TABS
50.0000 mg | ORAL_TABLET | Freq: Every day | ORAL | Status: DC
  Administered 2019-05-11 – 2019-05-12 (×2): 50 mg via ORAL
  Filled 2019-05-11 (×3): qty 2

## 2019-05-11 MED ORDER — LEVOTHYROXINE SODIUM 50 MCG PO TABS
50.0000 ug | ORAL_TABLET | Freq: Every day | ORAL | Status: DC
  Administered 2019-05-11 – 2019-05-13 (×3): 50 ug via ORAL
  Filled 2019-05-11 (×3): qty 1

## 2019-05-11 NOTE — Progress Notes (Signed)
ANTICOAGULATION CONSULT NOTE  Pharmacy Consult for Heparin / Coumadin Indication: stroke and mechanical valve  Allergies  Allergen Reactions  . Hydrocodone-Acetaminophen Other (See Comments)    PT MOTHER DOES NOT REMEMBER REACTION  Only use in Emergency   . Hydromorphone Itching and Rash       . Other Other (See Comments)    ALL NARCOTICS-ITCHING, RASH   . Oxycodone Other (See Comments)    Unknown reaction  . Penicillins Rash    Did it involve swelling of the face/tongue/throat, SOB, or low BP? Unknown Did it involve sudden or severe rash/hives, skin peeling, or any reaction on the inside of your mouth or nose? Unknown Did you need to seek medical attention at a hospital or doctor's office? Unknown When did it last happen?unknown If all above answers are "NO", may proceed with cephalosporin use.  . Dilaudid [Hydromorphone Hcl] Itching and Rash    Patient Measurements: Height: 5\' 11"  (180.3 cm) Weight: 233 lb 11 oz (106 kg) IBW/kg (Calculated) : 75.3 Heparin Dosing Weight: 97.7kg  Vital Signs: Temp: 99.1 F (37.3 C) (01/04 0800) Temp Source: Oral (01/04 0800) BP: 137/80 (01/04 0700) Pulse Rate: 66 (01/04 0800)  Labs: Recent Labs    05/08/19 1041 05/08/19 1041 05/08/19 1047 05/08/19 1810 05/08/19 2357 05/09/19 0107 05/09/19 0112 05/09/19 0504 05/09/19 1028 05/10/19 0505 05/10/19 1347 05/11/19 0135  HGB 14.7   < > 15.6  --   --    < >  --  12.7*  --  11.7*  --  11.1*  HCT 45.1   < > 46.0  --   --   --   --  39.1  --  36.6*  --  34.3*  PLT 357  --   --   --   --   --   --  370  --  313  --  294  APTT 25  --   --   --   --   --   --   --   --   --   --   --   LABPROT 17.4*  --   --   --   --   --   --  17.5*  --   --   --   --   INR 1.4*  --   --   --   --   --   --  1.5*  --   --   --   --   HEPARINUNFRC  --    < >  --    < >  --   --   --   --  0.25* 0.47 0.55 0.37  CREATININE 1.02  --  0.90  --  0.82  --  0.78  --   --   --   --   --   TROPONINIHS   --   --   --    < > 228*  --  236* 226* 230*  --   --   --    < > = values in this interval not displayed.    Estimated Creatinine Clearance: 132.3 mL/min (by C-G formula based on SCr of 0.78 mg/dL).   Assessment: 61 YOM presented to the ED with stroke symptoms. S/p recent discharge from [facility]. He is supposed to be on Coumadin and Lovenox for history of a mechanical valve.  Patient is now s/p thrombectomy and continues on IV Heparin.  Pharmacy consulted to resume Coumadin on  05/10/18.  Heparin level therapeutic; last INR was sub-therapeutic as patient was not taking Coumadin post discharge from [place].  No bleeding reported.  Home Coumadin regimen: 5mg  alternating with 2.5mg  daily  Goal of Therapy:  Heparin level 0.3-0.5 units/ml  INR 2.5 - 3.5 Monitor platelets by anticoagulation protocol: Yes   Plan:  Continue heparin gtt at 1400 units/hr Coumadin 5mg  PO today Daily heparin level, CBC and PT / INR  Woodroe Vogan D. Mina Marble, PharmD, BCPS, [place] 05/11/2019, 10:04 AM

## 2019-05-11 NOTE — Consult Note (Signed)
Physical Medicine and Rehabilitation Consult  Reason for Consult: Stroke with functional deficits.  Referring Physician: Dr. Pearlean Brownie.    HPI: Jon [name] is a 54 y.o. 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 He was admitted on 05/08/19 with sudden onset of left ward and right flaccid paralysis. . CT head showed abnormal hypodensity in left callosal body and possibly pericallosal gyrus on the left. CTA head/neck showed thrombosis of right innominate artery extending into right CCA with high grade stenosis and abrupt occlusion of proximal A2 left ACA with reconstitution distally and extensive airspace disease in left apex suspicious for PNA.  CTA chest showed stable diffuse lung opacity concerning for PNA and no definite dissection or aneurysm of thoracic or aortic aorta and thrombosis of right innominate artery. He underwent cerebral angio with attempted thrombectomy of left A2 occlusion without significant improvement in flow and non flow limiting dissection of distal cervical segment identified.   After multiple attempts, family contacted for input and reported that patient recently discharged from hospital on lovenox bid due to subtherapeutic INR but mother had not been administering injections. 2 D echo showed eF 25-30% with no SOE. He was placed on IV heparin and MRI brain pending. Stoke felt to be embolic in setting of subtherapeutic INR and work up pending. Therapy evaluations completed revealing severe dysarthria, dysphagia with aspiration of nectars--on dysphagia 1, honey liquids, RLE weakness with hypoxia affecting mobility. CIR recommended due to functional decline.    ROS  Cannot obtain due to severe dysarthria  Past Medical History:  Diagnosis Date  . Aneurysm of thoracic aorta (HCC) 05/06/2012   IMO routine update IMO routine update  . Anxiety    sees Dr. Renae Fickle  . Aortic aneurysm and dissection (HCC) 05/06/2012  . Aortic  stenosis    mechanical AVR 04/22/12  . Aortic valve disorder 05/06/2012  . 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 Bicuspid aortic valve   . Cerebral infarction (HCC) 05/11/2012  . Cerebral thrombosis with cerebral infarction (HCC) 07/14/2013  . Cholelithiasis 12/20/2016  . Current use of long term anticoagulation 12/20/2016  . CVA (cerebral infarction)    occipital CVA 12/13  . Dissecting aortic aneurysm (HCC) 05/06/2012  . Dysarthria 09/10/2017  . Dysrhythmia    sees Dr. Geronimo Running, [facility] cardiology in [place]  . Generalized constriction of visual field 10/15/2012   IMOUPDATE IMOUPDATE  . 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 GERD (gastroesophageal reflux disease)   . HCAP (healthcare-associated pneumonia) 05/06/2012  . Headache(784.0)    "due to vision problem"  . History of CVA (cerebrovascular accident) 05/07/2012  . Hypothyroidism   . Idiopathic peripheral neuropathy 07/14/2013  . Late effects of CVA (cerebrovascular accident) 08/08/2015  . Nontraumatic multiple localized intracerebral hemorrhages (HCC) 10/16/2017  . Paroxysmal atrial fibrillation (HCC) 08/08/2015   On warfarin On warfarin  . Presence of prosthetic heart valve 08/08/2015  . Shortness of breath   . Stroke (HCC)    x3  . Stroke, embolic (HCC) 05/07/2012  . Syncope 05/06/2012  . Thoracic aortic aneurysm (HCC)    replacement aortic root 04/22/12  . VBI (vertebrobasilar insufficiency) 09/10/2017    Past Surgical History:  Procedure Laterality Date  . APPLICATION OF WOUND VAC  06/11/2012   Procedure: APPLICATION OF WOUND VAC;  Surgeon: [name], MD;  Location: [facility] OR;  Service: Vascular;  Laterality: Right;  . CARDIAC CATHETERIZATION    . CHOLECYSTECTOMY    .  CORONARY ARTERY BYPASS GRAFT  04/22/2012   Procedure: CORONARY ARTERY BYPASS GRAFTING (CABG);  Surgeon: Melrose Nakayama, MD;  Location: [place];  Service: Open Heart Surgery;  Laterality: N/A;  times one with  right greater saphenous vein graft  . I & D EXTREMITY  06/11/2012   Procedure: IRRIGATION AND DEBRIDEMENT EXTREMITY;  Surgeon: Melrose Nakayama, MD;  Location: [place];  Service: Vascular;  Laterality: Right;  . INTRAOPERATIVE TRANSESOPHAGEAL ECHOCARDIOGRAM  04/22/2012   Procedure: INTRAOPERATIVE TRANSESOPHAGEAL ECHOCARDIOGRAM;  Surgeon: Melrose Nakayama, MD;  Location: [place];  Service: Open Heart Surgery;  Laterality: N/A;  . IR CT HEAD LTD  05/08/2019  . IR PERCUTANEOUS ART THROMBECTOMY/INFUSION INTRACRANIAL INC DIAG ANGIO  05/08/2019  . IR US GUIDE VASC ACCESS RIGHT  05/08/2019  . ROTATOR CUFF REPAIR     12 years ago, Left  . THORACIC AORTIC ANEURYSM REPAIR  04/22/2012   Procedure: THORACIC ASCENDING ANEURYSM REPAIR (AAA);  Surgeon: Melrose Nakayama, MD;  Location: [place];  Service: Open Heart Surgery;  Laterality: N/A;    Family History  Problem Relation Age of Onset  . Lung disease Mother   . Diabetes Father   . Peripheral Artery Disease Father   . Hypertension Father   . CAD Father   . Heart disease Father   . Stroke Father   . Cancer Maternal Grandfather   . Heart disease Paternal Grandfather     Social History:  reports that he has been smoking cigarettes. He started smoking about 7 years ago. He has been smoking about 2.50 packs per day. He has never used smokeless tobacco. He reports current alcohol use. He reports that he does not use drugs.    Allergies  Allergen Reactions  . Hydrocodone-Acetaminophen Other (See Comments)    PT MOTHER DOES NOT REMEMBER REACTION  Only use in Emergency   . Hydromorphone Itching and Rash       . Other Other (See Comments)    ALL NARCOTICS-ITCHING, RASH   . Oxycodone Other (See Comments)    Unknown reaction  . Penicillins Rash    Did it involve swelling of the face/tongue/throat, SOB, or low BP? Unknown Did it involve sudden or severe rash/hives, skin peeling, or any reaction on the inside of your mouth or nose? Unknown Did you  need to seek medical attention at a hospital or doctor's office? Unknown When did it last happen?unknown If all above answers are "NO", may proceed with cephalosporin use.  . Dilaudid [Hydromorphone Hcl] Itching and Rash    Medications Prior to Admission  Medication Sig Dispense Refill  . acetaminophen (TYLENOL) 500 MG tablet Take 1,000 mg by mouth every 4 (four) hours as needed for headache (pain).     Marland Kitchen albuterol (VENTOLIN HFA) 108 (90 Base) MCG/ACT inhaler Inhale into the lungs every 4 (four) hours as needed for wheezing or shortness of breath.    Marland Kitchen amiodarone (PACERONE) 200 MG tablet Take 1 tablet (200 mg total) by mouth 3 (three) times daily. (Patient taking differently: Take 200 mg by mouth daily. ) 90 tablet 1  . dicyclomine (BENTYL) 20 MG tablet Take 20 mg by mouth every 6 (six) hours as needed (abdominal pain).    . DULoxetine (CYMBALTA) 30 MG capsule Take 30 mg by mouth at bedtime.    . enoxaparin (LOVENOX) 100 MG/ML injection Inject 100 mg into the skin 2 (two) times daily.    . furosemide (LASIX) 20 MG tablet Take 20 mg by mouth daily.     Marland Kitchen  gabapentin (NEURONTIN) 300 MG capsule Take 300 mg by mouth 3 (three) times daily.     Marland Kitchen ipratropium-albuterol (DUONEB) 0.5-2.5 (3) MG/3ML SOLN Take 3 mLs by nebulization 4 (four) times daily.    Marland Kitchen levothyroxine (SYNTHROID, LEVOTHROID) 50 MCG tablet Take 50 mcg by mouth at bedtime.     Marland Kitchen omeprazole (PRILOSEC) 20 MG capsule Take 20 mg by mouth daily.     . rosuvastatin (CRESTOR) 40 MG tablet Take 40 mg by mouth at bedtime.   12  . spironolactone (ALDACTONE) 25 MG tablet Take 12.5 mg by mouth daily.    Marland Kitchen tiotropium (SPIRIVA HANDIHALER) 18 MCG inhalation capsule Place 18 mcg into inhaler and inhale daily at 2 PM.    . topiramate (TOPAMAX) 50 MG tablet Take 50 mg by mouth at bedtime.   5  . aspirin 81 MG chewable tablet Chew 1 tablet (81 mg total) by mouth daily. (Patient not taking: Reported on 05/08/2019)    . empagliflozin (JARDIANCE) 10 MG  TABS tablet Take 10 mg by mouth daily.    . sacubitril-valsartan (ENTRESTO) 24-26 MG Take 1 tablet by mouth 2 (two) times daily.    Marland Kitchen warfarin (COUMADIN) 5 MG tablet TAKE 1 & 1/2 TO 2 TABLETS BY MOUTH DAILY AS DIRECTED BY COUMADIN CLINIC (Patient taking differently: Take 2.5-5 mg by mouth See admin instructions. Take one tablet (5 mg) by mouth 1st night, then take 1/2 tablet (2.5 mg) 2nd night, then repeat) 65 tablet 0    Home: Home Living Family/patient expects to be discharged to:: Private residence Living Arrangements: Parent Available Help at Discharge: Family, Available 24 hours/day Type of Home: House Home Access: Stairs to enter [employer] of Steps: 3-5 (back), 3 front Home Layout: One level Bathroom Shower/Tub: Engineer, manufacturing systems: Standard Home Equipment: Information systems manager, Bedside commode, Environmental consultant - 2 wheels, Cane - single point, Wheelchair - manual Additional Comments: Info from pt s/p extubation  Functional History: Prior Function Level of Independence: Independent with assistive device(s) Comments: pt reports that he lives with his mother, but was independent with ADL and mobility with an AD usually Functional Status:  Mobility: Bed Mobility Overal bed mobility: Needs Assistance Bed Mobility: Rolling, Sidelying to Sit Rolling: Mod assist Sidelying to sit: Mod assist(via R side) Supine to sit: HOB elevated, Min guard General bed mobility comments: cued for technique, assist given slowly to allow pt to assist with UE's as able. Transfers Overall transfer level: Needs assistance Equipment used: 2 person hand held assist Transfers: Sit to/from Stand Sit to Stand: Mod assist Stand pivot transfers: Mod assist, +2 physical assistance, +2 safety/equipment, From elevated surface General transfer comment: from a lower surface, cues for hand placement, assist to come forward and support for pt to boost. Ambulation/Gait Ambulation/Gait assistance: Mod assist,  +2 physical assistance, +2 safety/equipment Gait Distance (Feet): 25 Feet Assistive device: Rolling walker (2 wheeled) Gait Pattern/deviations: Step-to pattern, Step-through pattern, Decreased step length - right, Decreased step length - left, Decreased stance time - right, Decreased stride length General Gait Details: Used RW which turned out to be a liability due to pt unable to keep R hand on the RW without consistent assist.  Pt need w/shift R assist and support while he advance his R LE then stability/support at R knee for advancing the L LE.  Second help was for IV and controlling the RW and could not then support the patient. Gait velocity: slower Gait velocity interpretation: <1.8 ft/sec, indicate of risk for recurrent falls  ADL: ADL Overall ADL's : Needs assistance/impaired Eating/Feeding: Modified independent, Sitting Grooming: Min guard, Sitting Upper Body Bathing: Minimal assistance, Sitting Lower Body Bathing: Moderate assistance, Cueing for safety, Sitting/lateral leans, Sit to/from stand Upper Body Dressing : Minimal assistance, Sitting Lower Body Dressing: Moderate assistance, Sitting/lateral leans, Sit to/from stand Toilet Transfer: Moderate assistance, +2 for physical assistance, +2 for safety/equipment, Cueing for safety, Cueing for sequencing, Ambulation Toileting- Clothing Manipulation and Hygiene: Moderate assistance, +2 for physical assistance, +2 for safety/equipment, Sitting/lateral lean, Sit to/from stand Functional mobility during ADLs: Minimal assistance, Moderate assistance, +2 for physical assistance, +2 for safety/equipment, Cueing for safety, Cueing for sequencing General ADL Comments: Pt limited by poor strength in R side, poor mobility and decreased activity tolerance  Cognition: Cognition Overall Cognitive Status: Difficult to assess(seems to answer all simple questions approp. given time) Orientation Level: Oriented X4 Cognition Arousal/Alertness:  Awake/alert Behavior During Therapy: Flat affect Overall Cognitive Status: Difficult to assess(seems to answer all simple questions approp. given time) General Comments: pt with expressive aphasia answering yes/no questions appropriately and attempting to expand upon statements but difficult to understand Difficult to assess due to: Impaired communication  Blood pressure 137/80, pulse 66, temperature 97.7 F (36.5 C), temperature source Oral, resp. rate (!) 22, height 5\' 11"  (1.803 m), weight 106 kg, SpO2 98 %. Physical Exam  Constitutional: He appears well-developed and well-nourished.  HENT:  Head: Normocephalic and atraumatic.  Eyes: Pupils are equal, round, and reactive to light. Conjunctivae and EOM are normal.  Cardiovascular: An irregularly irregular rhythm present.  + valve click  Respiratory: Effort normal and breath sounds normal. No respiratory distress. He has no wheezes. He has no rales.  GI: Soft. Bowel sounds are normal. He exhibits no distension.  Genitourinary:    Genitourinary Comments: Urinary catheter Right inguinal area bandaged   Musculoskeletal:        General: No tenderness, deformity or edema.  Neurological: He is alert. Coordination and gait abnormal.  Oriented to person, place, severe dysarthria limits communication   Right central VII  Motor 3- Right delt bi, tri, FF, FE 3- R HF, 4/5 Right Knee ext and ankle DF/PF    Results for orders placed or performed during the hospital encounter of 05/08/19 (from the past 24 hour(s))  Glucose, capillary     Status: None   Collection Time: 05/10/19 11:45 AM  Result Value Ref Range   Glucose-Capillary 98 70 - 99 mg/dL   Comment 1 Notify RN    Comment 2 Document in Chart   Heparin level (unfractionated)     Status: None   Collection Time: 05/10/19  1:47 PM  Result Value Ref Range   Heparin Unfractionated 0.55 0.30 - 0.70 IU/mL  Glucose, capillary     Status: Abnormal   Collection Time: 05/10/19  3:38 PM    Result Value Ref Range   Glucose-Capillary 145 (H) 70 - 99 mg/dL   Comment 1 Notify RN    Comment 2 Document in Chart   Glucose, capillary     Status: Abnormal   Collection Time: 05/10/19  7:55 PM  Result Value Ref Range   Glucose-Capillary 111 (H) 70 - 99 mg/dL  Heparin level (unfractionated)     Status: None   Collection Time: 05/11/19  1:35 AM  Result Value Ref Range   Heparin Unfractionated 0.37 0.30 - 0.70 IU/mL  CBC with Differential     Status: Abnormal   Collection Time: 05/11/19  1:35 AM  Result Value Ref Range  WBC 14.8 (H) 4.0 - 10.5 K/uL   RBC 3.87 (L) 4.22 - 5.81 MIL/uL   Hemoglobin 11.1 (L) 13.0 - 17.0 g/dL   HCT 81.1 (L) 91.4 - 78.2 %   MCV 88.6 80.0 - 100.0 fL   MCH 28.7 26.0 - 34.0 pg   MCHC 32.4 30.0 - 36.0 g/dL   RDW 95.6 (H) 21.3 - 08.6 %   Platelets 294 150 - 400 K/uL   nRBC 0.0 0.0 - 0.2 %   Neutrophils Relative % 82 %   Neutro Abs 12.1 (H) 1.7 - 7.7 K/uL   Lymphocytes Relative 7 %   Lymphs Abs 1.1 0.7 - 4.0 K/uL   Monocytes Relative 8 %   Monocytes Absolute 1.2 (H) 0.1 - 1.0 K/uL   Eosinophils Relative 2 %   Eosinophils Absolute 0.3 0.0 - 0.5 K/uL   Basophils Relative 0 %   Basophils Absolute 0.0 0.0 - 0.1 K/uL   Immature Granulocytes 1 %   Abs Immature Granulocytes 0.16 (H) 0.00 - 0.07 K/uL  Magnesium     Status: None   Collection Time: 05/11/19  1:35 AM  Result Value Ref Range   Magnesium 1.9 1.7 - 2.4 mg/dL  Phosphorus     Status: None   Collection Time: 05/11/19  1:35 AM  Result Value Ref Range   Phosphorus 3.2 2.5 - 4.6 mg/dL  Glucose, capillary     Status: Abnormal   Collection Time: 05/11/19  8:21 AM  Result Value Ref Range   Glucose-Capillary 131 (H) 70 - 99 mg/dL   Comment 1 Notify RN    Comment 2 Document in Chart    DG Swallowing Func-Speech Pathology  Result Date: 05/10/2019 Objective Swallowing Evaluation: Type of Study: MBS-Modified Barium Swallow Study  Patient Details Name: Harshaan Whang MRN: [id number] Date of Birth:  [birth year] Today's Date: 05/10/2019 Time: SLP Start Time (ACUTE ONLY): 1245 -SLP Stop Time (ACUTE ONLY): 1315 SLP Time Calculation (min) (ACUTE ONLY): 30 min Past Medical History: Past Medical History: Diagnosis Date . Aneurysm of thoracic aorta (HCC) 05/06/2012  IMO routine update IMO routine update . Anxiety   sees Dr. Renae Fickle . Aortic aneurysm and dissection (HCC) 05/06/2012 . Aortic stenosis   mechanical AVR 04/22/12 . Aortic valve disorder 05/06/2012 . 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 Bicuspid aortic valve  . Cerebral infarction (HCC) 05/11/2012 . Cerebral thrombosis with cerebral infarction (HCC) 07/14/2013 . Cholelithiasis 12/20/2016 . Current use of long term anticoagulation 12/20/2016 . CVA (cerebral infarction)   occipital CVA 12/13 . Dissecting aortic aneurysm (HCC) 05/06/2012 . Dysarthria 09/10/2017 . Dysrhythmia   sees Dr. Geronimo Running, [facility] cardiology in [place] . Generalized constriction of visual field 10/15/2012  IMOUPDATE IMOUPDATE . 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 GERD (gastroesophageal reflux disease)  . HCAP (healthcare-associated pneumonia) 05/06/2012 . Headache(784.0)   "due to vision problem" . History of CVA (cerebrovascular accident) 05/07/2012 . Hypothyroidism  . Idiopathic peripheral neuropathy 07/14/2013 . Late effects of CVA (cerebrovascular accident) 08/08/2015 . 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 Paroxysmal atrial fibrillation (HCC) 08/08/2015  On warfarin On warfarin . Presence of prosthetic heart valve 08/08/2015 . Shortness of breath  . Stroke (HCC)   x3 . Stroke, embolic (HCC) 05/07/2012 . Syncope 05/06/2012 . Thoracic aortic aneurysm (HCC)   replacement aortic root 04/22/12 . VBI (vertebrobasilar insufficiency) 09/10/2017 Past Surgical History: Past Surgical History: Procedure Laterality Date . APPLICATION OF WOUND VAC  06/11/2012  Procedure: APPLICATION OF WOUND VAC;  Surgeon: [name], MD;   Location: [facility] OR;  Service: Vascular;  Laterality: Right; . CARDIAC CATHETERIZATION   . CHOLECYSTECTOMY   . CORONARY ARTERY BYPASS GRAFT  04/22/2012  Procedure: CORONARY ARTERY BYPASS GRAFTING (CABG);  Surgeon: [name], MD;  Location: [facility] OR;  Service: Open Heart Surgery;  Laterality: N/A;  times one with right greater saphenous vein graft . I & D EXTREMITY  06/11/2012  Procedure: IRRIGATION AND DEBRIDEMENT EXTREMITY;  Surgeon: [name], MD;  Location: [facility] OR;  Service: Vascular;  Laterality: Right; . INTRAOPERATIVE TRANSESOPHAGEAL ECHOCARDIOGRAM  04/22/2012  Procedure: INTRAOPERATIVE TRANSESOPHAGEAL ECHOCARDIOGRAM;  Surgeon: [name], MD;  Location: [facility] OR;  Service: Open Heart Surgery;  Laterality: N/A; . IR CT HEAD LTD  05/08/2019 . IR PERCUTANEOUS ART THROMBECTOMY/INFUSION INTRACRANIAL INC DIAG ANGIO  05/08/2019 . IR US GUIDE VASC ACCESS RIGHT  05/08/2019 . ROTATOR CUFF REPAIR    12 years ago, Left . THORACIC AORTIC ANEURYSM REPAIR  04/22/2012  Procedure: THORACIC ASCENDING ANEURYSM REPAIR (AAA);  Surgeon: [name], MD;  Location: [facility] OR;  Service: Open Heart Surgery;  Laterality: N/A; HPI: 54 yo admitted with right weakness and left gaze with aphasia with acute left ACA anterior territory CVA s/p revascularization. VDRF 1/1-1/2. PMhx: dysarthria, aortic dissection s/p repair, AVR, GERD, CVA x 3. Prior CVA resulted in verbosity, cognitive impairment including impulsivity, mild dysphagia. In 2014 MBS recommended regualr thin with a chin tuck.  No data recorded Assessment / Plan / Recommendation CHL IP CLINICAL IMPRESSIONS 05/10/2019 Clinical Impression Pt demonstrates a primary oral dysphagia with resulting pharyngeal deficits related to timing, but not strength. Pt has right labial and lingual weakness, and on fluro there is obviously decreased lingual coordination and velopharyngeal elevation and coordination. Due to weakness there is anterior spillage, particularly at the  moment of swallow initiation due to escape of pressure through the oral cavity. Pt requries intermittent explicit verbal cues for how to move his tongue to initiate oral transit which aide in more reflexive lingual pumping. Pumping does result in piecemeal, uncontrolled spillage with multiple reflexive swallow per bolus. Thin and nectar thick liquids spill and are aspirated before the swallow. Honey thick liquids as well even had come frank penetration with immediate ejection with a throat clear. A chin tuck helps increase time for swallow initaition and pt is familiar with this strategy from prior CVA. Study was quite limited as pt seemed very overwhelmed and uncomfortable though he did participate. Recommend pt initiate a puree dys 1 diet with honey thick liquids with a chin tuck and spoon feeding preferred at this time.  SLP Visit Diagnosis Dysphagia, oropharyngeal phase (R13.12) Attention and concentration deficit following -- Frontal lobe and executive function deficit following -- Impact on safety and function Moderate aspiration risk   CHL IP TREATMENT RECOMMENDATION 05/10/2019 Treatment Recommendations F/U MBS in --- days (Comment)   Prognosis 05/08/2012 Prognosis for Safe Diet Advancement Good Barriers to Reach Goals Cognitive deficits Barriers/Prognosis Comment -- CHL IP DIET RECOMMENDATION 05/10/2019 SLP Diet Recommendations Dysphagia 1 (Puree) solids;Honey thick liquids Liquid Administration via -- Medication Administration -- Compensations Slow rate;Small sips/bites;Chin tuck Postural Changes Remain semi-upright after after feeds/meals (Comment)   CHL IP OTHER RECOMMENDATIONS 05/07/2012 Recommended Consults -- Oral Care Recommendations -- Other Recommendations Order thickener from pharmacy   CHL IP FOLLOW UP RECOMMENDATIONS 05/10/2019 Follow up Recommendations Inpatient Rehab   CHL IP FREQUENCY AND DURATION 05/10/2019 Speech Therapy Frequency (ACUTE ONLY) min 2x/week Treatment Duration --  CHL IP ORAL PHASE  05/10/2019 Oral Phase Impaired Oral - Pudding Teaspoon -- Oral - Pudding Cup -- Oral - Honey Teaspoon Decreased bolus cohesion;Delayed oral transit;Premature spillage;Lingual/palatal residue;Decreased velopharyngeal closure;Reduced posterior propulsion;Lingual pumping;Weak lingual manipulation Oral - Honey Cup -- Oral - Nectar Teaspoon Decreased bolus cohesion;Delayed oral transit;Premature spillage;Lingual/palatal residue;Decreased velopharyngeal closure;Reduced posterior propulsion;Lingual pumping;Weak lingual manipulation Oral - Nectar Cup -- Oral - Nectar Straw -- Oral - Thin Teaspoon -- Oral - Thin Cup Decreased bolus cohesion;Delayed oral transit;Premature spillage;Lingual/palatal residue;Decreased velopharyngeal closure;Reduced posterior propulsion;Lingual pumping;Weak lingual manipulation Oral - Thin Straw -- Oral - Puree Decreased bolus cohesion;Delayed oral transit;Premature spillage;Lingual/palatal residue;Decreased velopharyngeal closure;Reduced posterior propulsion;Lingual pumping;Weak lingual manipulation Oral - Mech Soft -- Oral - Regular -- Oral - Multi-Consistency -- Oral - Pill -- Oral Phase - Comment --  CHL IP PHARYNGEAL PHASE 05/10/2019 Pharyngeal Phase Impaired Pharyngeal- Pudding Teaspoon -- Pharyngeal -- Pharyngeal- Pudding Cup -- Pharyngeal -- Pharyngeal- Honey Teaspoon Delayed swallow initiation-vallecula;Penetration/Aspiration before swallow Pharyngeal Material enters airway, passes BELOW cords then ejected out;Material does not enter airway Pharyngeal- Honey Cup -- Pharyngeal -- Pharyngeal- Nectar Teaspoon Delayed swallow initiation-pyriform sinuses;Penetration/Aspiration before swallow Pharyngeal Material enters airway, passes BELOW cords and not ejected out despite cough attempt by patient Pharyngeal- Nectar Cup -- Pharyngeal -- Pharyngeal- Nectar Straw -- Pharyngeal -- Pharyngeal- Thin Teaspoon Delayed swallow initiation-pyriform sinuses;Penetration/Aspiration before swallow Pharyngeal  Material enters airway, passes BELOW cords and not ejected out despite cough attempt by patient Pharyngeal- Thin Cup -- Pharyngeal -- Pharyngeal- Thin Straw -- Pharyngeal -- Pharyngeal- Puree Delayed swallow initiation-vallecula Pharyngeal -- Pharyngeal- Mechanical Soft -- Pharyngeal -- Pharyngeal- Regular -- Pharyngeal -- Pharyngeal- Multi-consistency -- Pharyngeal -- Pharyngeal- Pill -- Pharyngeal -- Pharyngeal Comment --  No flowsheet data found. [place], [name] Nearing 05/10/2019, 2:01 PM              ECHOCARDIOGRAM COMPLETE  Result Date: 05/09/2019   ECHOCARDIOGRAM REPORT   Patient Name:   Jessica Priest Biscoe Date of Exam: 05/09/2019 Medical Rec #:  [id number]           Height:       71.0 in Accession #:    [id number]          Weight:       233.7 lb Date of Birth:  1966-04-12           BSA:          2.25 m Patient Age:    53 years            BP:           95/82 mmHg Patient Gender: M                   HR:           63 bpm. Exam Location:  Inpatient Procedure: 2D Echo Indications:    stroke 434.91  History:        Patient has prior history of Echocardiogram examinations, most                 recent 05/01/2018. Aortic dissection; Risk Factors:Current                 Smoker. Aortic Valve: A mechanical aortic valve prosthesis valve                 is present in the aortic position.  Sonographer:    Delcie Roch Referring Phys: [id number] ASHISH ARORA IMPRESSIONS  1. Left ventricular ejection fraction, by visual estimation,  is 25 to 30%. The left ventricle has severely decreased function. There is no left ventricular hypertrophy.  2. Elevated left ventricular end-diastolic pressure.  3. Left ventricular diastolic parameters are consistent with Grade II diastolic dysfunction (pseudonormalization).  4. The left ventricle demonstrates global hypokinesis.  5. Global right ventricle has normal systolic function.The right ventricular size is normal. No increase in right ventricular wall thickness.  6. Left atrial size  was normal.  7. Right atrial size was normal.  8. The mitral valve is normal in structure. Mild mitral valve regurgitation. No evidence of mitral stenosis.  9. The tricuspid valve is normal in structure. 10. Aortic valve regurgitation is not visualized. Mild aortic valve stenosis. 11. Mechanical prosthesis in the aortic valve position. 12. The pulmonic valve was normal in structure. Pulmonic valve regurgitation is not visualized. 13. Normal pulmonary artery systolic pressure. 14. The inferior vena cava is normal in size with greater than 50% respiratory variability, suggesting right atrial pressure of 3 mmHg. 15. When compared to the prior study from 05/01/2018 LVEF has decreased from 55-60% to 25-30% with diffuse hypokinesis. Transaortic gradients across the mechanical aortic valve are unchanged. FINDINGS  Left Ventricle: Left ventricular ejection fraction, by visual estimation, is 25 to 30%. The left ventricle has severely decreased function. The left ventricle demonstrates global hypokinesis. There is no left ventricular hypertrophy. Left ventricular diastolic parameters are consistent with Grade II diastolic dysfunction (pseudonormalization). Elevated left ventricular end-diastolic pressure. Right Ventricle: The right ventricular size is normal. No increase in right ventricular wall thickness. Global RV systolic function is has normal systolic function. The tricuspid regurgitant velocity is 2.36 m/s, and with an assumed right atrial pressure  of 3 mmHg, the estimated right ventricular systolic pressure is normal at 25.3 mmHg. Left Atrium: Left atrial size was normal in size. Right Atrium: Right atrial size was normal in size Pericardium: There is no evidence of pericardial effusion. Mitral Valve: The mitral valve is normal in structure. Mild mitral valve regurgitation. No evidence of mitral valve stenosis by observation. Tricuspid Valve: The tricuspid valve is normal in structure. Tricuspid valve regurgitation  is mild. Aortic Valve: The aortic valve has been repaired/replaced. Aortic valve regurgitation is not visualized. Mild aortic stenosis is present. Aortic valve mean gradient measures 18.5 mmHg. Aortic valve peak gradient measures 34.5 mmHg. Aortic valve area, by VTI measures 1.81 cm. Mechanical aortic valve prosthesis valve is present in the aortic position. Pulmonic Valve: The pulmonic valve was normal in structure. Pulmonic valve regurgitation is not visualized. Pulmonic regurgitation is not visualized. Aorta: The aortic root, ascending aorta and aortic arch are all structurally normal, with no evidence of dilitation or obstruction. Venous: The inferior vena cava is normal in size with greater than 50% respiratory variability, suggesting right atrial pressure of 3 mmHg. IAS/Shunts: No atrial level shunt detected by color flow Doppler. There is no evidence of a patent foramen ovale. No ventricular septal defect is seen or detected. There is no evidence of an atrial septal defect.  LEFT VENTRICLE PLAX 2D LVIDd:         6.80 cm  Diastology LVIDs:         4.90 cm  LV e' lateral:   6.74 cm/s LV PW:         1.50 cm  LV E/e' lateral: 9.8 LV IVS:        1.10 cm  LV e' medial:    6.20 cm/s LVOT diam:     2.30 cm  LV E/e' medial:  10.7 LV SV:         126 ml LV SV Index:   54.12 LVOT Area:     4.15 cm  RIGHT VENTRICLE RV S prime:     10.10 cm/s TAPSE (M-mode): 1.3 cm LEFT ATRIUM              Index       RIGHT ATRIUM           Index LA diam:        4.90 cm  2.18 cm/m  RA Area:     19.10 cm LA Vol (A2C):   104.0 ml 46.17 ml/m RA Volume:   50.30 ml  22.33 ml/m LA Vol (A4C):   88.6 ml  39.33 ml/m LA Biplane Vol: 97.9 ml  43.46 ml/m  AORTIC VALVE AV Area (Vmax):    1.86 cm AV Area (Vmean):   1.69 cm AV Area (VTI):     1.81 cm AV Vmax:           293.50 cm/s AV Vmean:          196.000 cm/s AV VTI:            0.535 m AV Peak Grad:      34.5 mmHg AV Mean Grad:      18.5 mmHg LVOT Vmax:         131.50 cm/s LVOT Vmean:         79.750 cm/s LVOT VTI:          0.233 m LVOT/AV VTI ratio: 0.44  AORTA Ao Root diam: 3.80 cm MITRAL VALVE                        TRICUSPID VALVE MV Area (PHT): 3.48 cm             TR Peak grad:   22.3 mmHg MV PHT:        63.22 msec           TR Vmax:        236.00 cm/s MV Decel Time: 218 msec MV E velocity: 66.10 cm/s 103 cm/s  SHUNTS MV A velocity: 47.10 cm/s 70.3 cm/s Systemic VTI:  0.23 m MV E/A ratio:  1.40       1.5       Systemic Diam: 2.30 cm  Tobias Alexander MD Electronically signed by Tobias Alexander MD Signature Date/Time: 05/09/2019/1:28:37 PM    Final      Assessment/Plan: Diagnosis: 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 Does the need for close, 24 hr/day medical supervision in concert with the patient's rehab needs make it unreasonable for this patient to be served in a less intensive setting? Yes 2. 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 Due to bladder management, bowel management, safety, skin/wound care, disease management, medication administration, pain management and patient education, does the patient require 24 hr/day rehab nursing? Yes 4. Does the patient require coordinated care of a physician, rehab nurse, therapy disciplines of PT, OT, SLP to address physical and functional deficits in the context of the above medical diagnosis(es)? Yes Addressing deficits in the following areas: balance, endurance, locomotion, strength, transferring, bowel/bladder control, bathing, dressing, feeding, grooming, toileting, cognition, speech, language, swallowing and psychosocial support 5. Can the patient actively participate in an intensive therapy program of at least 3 hrs of therapy per day  at least 5 days per week? Yes 6. The potential for patient to make measurable gains while on inpatient rehab is good 7. Anticipated functional  outcomes upon discharge from inpatient rehab are supervision and min assist  with PT, supervision and min assist with OT, supervision and min assist with SLP. 8. Estimated rehab length of stay to reach the above functional goals is: 21-25d 9. Anticipated discharge destination: Home 10. Overall Rehab/Functional Prognosis: good  RECOMMENDATIONS: This patient's condition is appropriate for continued rehabilitative care in the following setting: CIR once off NE drip Patient has agreed to participate in recommended program. Potentially Note that insurance prior authorization may be required for reimbursement for recommended care.  Comment: needs to be off cleviprex and levophed with systolic BPs relatively controlled   Jacquelynn Cree, PA-C   "I have personally performed a face to face diagnostic evaluation of this patient.  Additionally, I have reviewed and concur with the physician assistant's documentation above." Erick Colace M.D. [facility] FAAPM&R ( Neuromuscular Med) Diplomate Am Board of Electrodiagnostic Med Fellow am Board of Interventional Pain med  05/11/2019

## 2019-05-11 NOTE — Progress Notes (Signed)
STROKE TEAM PROGRESS NOTE   INTERVAL HISTORY Patient is sitting up in bed.  He continues to have significant dysarthria and speech difficulties.  He is on IV heparin.  MRI is scheduled for later this morning.  Blood pressure adequately controlled.  OBJECTIVE Vitals:   05/11/19 1200 05/11/19 1300 05/11/19 1400 05/11/19 1600  BP: (!) 123/91 120/84 (!) 132/106   Pulse: 65 67 65   Resp: 20 (!) 24 (!) 21   Temp: 98.2 F (36.8 C)   97.9 F (36.6 C)  TempSrc: Axillary   Oral  SpO2: 94% 95% 97%   Weight:      Height:        CBC:  Recent Labs  Lab 05/10/19 0505 05/11/19 0135  WBC 16.3* 14.8*  NEUTROABS 13.8* 12.1*  HGB 11.7* 11.1*  HCT 36.6* 34.3*  MCV 90.1 88.6  PLT 313 294    Basic Metabolic Panel:  Recent Labs  Lab 05/08/19 2357 05/09/19 0112 05/10/19 0505 05/11/19 0135  NA 137 138  --   --   K 3.9 3.7  --   --   CL 107 106  --   --   CO2 22 17*  --   --   GLUCOSE 145* 138*  --   --   BUN 11 10  --   --   CREATININE 0.82 0.78  --   --   CALCIUM 8.0* 7.8*  --   --   MG  --  2.1 2.0 1.9  PHOS  --  3.5 3.2 3.2    Lipid Panel:     Component Value Date/Time   CHOL 132 05/09/2019 0504   TRIG 157 (H) 05/09/2019 0504   HDL 34 (L) 05/09/2019 0504   CHOLHDL 3.9 05/09/2019 0504   VLDL 31 05/09/2019 0504   LDLCALC 67 05/09/2019 0504   HgbA1c:  Lab Results  Component Value Date   HGBA1C 6.9 (H) 05/09/2019   Urine Drug Screen: No results found for: LABOPIA, COCAINSCRNUR, LABBENZ, AMPHETMU, THCU, LABBARB  Alcohol Level No results found for: ETH  IMAGING  CT HEAD CODE STROKE WO CONTRAST 05/08/2019 2.1 x 1.1 cm focus of abnormal hypodensity within the left aspect of the callosal body and possibly also involving the pericallosal gyrus on the left. Findings are new from prior examination 10/26/2018 and may reflect an acute infarct or other lesion. Consider brain MRI for further evaluation. 021 11:01   CT Code Stroke CTA Head W/WO contrast 05/08/2019   ADDENDUM: Upon  further review, there is likely greater stenosis within the proximal left common carotid artery than originally stated. This is on the basis of circumferential plaque or thrombus. Exact quantification of stenosis is difficult due to vessel tortuosity, but likely moderate/severe.  1. 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  2. 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 Thrombus extends into the proximal right common carotid artery with high-grade stenosis. There is a progressive decrease in this stenosis towards the distal CCA. There is abrupt caliber change of the cervical right ICA just distal to the bulb. New from prior examinations, the cervical ICA is markedly asymmetrically diminutive throughout the neck and through the siphon region. Sites of high-grade stenosis within the siphon region.  3. The left common and cervical internal carotid arteries are patent without significant stenosis. Circumferential plaque or thrombus within the proximal left common carotid artery.  4. Bilateral vertebral arteries patent  within the neck without significant stenosis.  5. Extensive airspace disease within the partially imaged left lung apex suspicious for pneumonia.   CTA head 05/08/2019   1. The right ICA is markedly diminutive through the siphon region with sites of high-grade stenosis. Mixed plaque also results in mild/moderate stenosis of the supraclinoid right ICA.  2. Abrupt occlusion of the proximal A2 left anterior cerebral artery. There appears to be some reconstitution of the left ACA distally.  3. No other intracranial large vessel occlusion or proximal high-grade arterial stenosis is identified.   CT Code Stroke Cerebral Perfusion with contrast 05/08/2019 125 mL region of hypoperfused parenchyma predominantly within the left ACA and ACA/MCA watershed territory, as well as right ACA/MCA watershed  territory. The perfusion software does not detect any core infarct. However, there were clear changes of infarction within the corpus callosum and left pericallosal gyrus on noncontrast head CT performed earlier the same day.   DG Chest Port 1 View 05/08/2019 1. Interval intubation with endotracheal tube tip terminating between the thoracic inlet and carina. Nasogastric tube courses below the diaphragm with tip out of field of view.  2. Diffuse interstitial opacities throughout the left lung, improved compared to prior radiograph.  05/09/2019 Stable support apparatus. Stable diffuse reticular densities are noted in left lung concerning for inflammation, atelectasis or possibly edema.  CT Angio Chest/Abd/Pel for Dissection W and/or W/WO 05/08/2019 Status post surgical repair of ascending thoracic aortic aneurysm as well as aortic valve repair. No definite evidence of acute dissection or aneurysm is seen involving the thoracic or abdominal aorta. 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 Stable diffuse lung opacity is noted concerning for pneumonia or scarring. Bilateral fat containing inguinal hernias are noted, right greater than left. Aortic Atherosclerosis (ICD10-I70.0) and Emphysema (ICD10-J43.9).  IR Thrombectomy Attempt 05/08/19  Left A2 occlusion, remained occluded at the conclusion  CT shows no SAH at conclusion.  Staining of the infarction of left ACA territory  Non-flow limiting dissection at the distal left cervical ICA  CT Head WO Contrast (limited) 05/10/2019  Status post ultrasound guided access right common femoral artery for left-sided cervical and cerebral angiogram and attempted thrombectomy of left A2 occlusion, with 3 passes primary aspiration  and a single pass of mechanical thrombectomy yielding no significant improvement in flow through the A2 segment.  Non flow limiting dissection was identified in the distal  cervical segment.  MRI Brain WO Contrast -  05/11/2019 Multiple acute infarcts as detailed above primarily involving left ACA and MCA territories.  Transthoracic Echocardiogram  05/09/2019  1. Left ventricular ejection fraction, by visual estimation, is 25 to 30%. The left ventricle has severely decreased function. There is no left ventricular hypertrophy.  2. Elevated left ventricular end-diastolic pressure.  3. Left ventricular diastolic parameters are consistent with Grade II diastolic dysfunction (pseudonormalization).  4. The left ventricle demonstrates global hypokinesis.  5. Global right ventricle has normal systolic function.The right ventricular size is normal. No increase in right ventricular wall thickness.  6. Left atrial size was normal.  7. Right atrial size was normal.  8. The mitral valve is normal in structure. Mild mitral valve regurgitation. No evidence of mitral stenosis.  9. The tricuspid valve is normal in structure. 10. Aortic valve regurgitation is not visualized. Mild aortic valve stenosis. 11. Mechanical prosthesis in the aortic valve position. 12. The pulmonic valve was normal in structure. Pulmonic valve regurgitation is not visualized.  13. Normal pulmonary artery systolic pressure. 14. The inferior vena cava is normal in size with greater than 50% respiratory variability, suggesting right atrial pressure of 3 mmHg. 15. When compared to the prior study from 05/01/2018 LVEF has decreased from 55-60% to 25-30% with diffuse hypokinesis. Transaortic gradients across the mechanical aortic valve are unchanged.  ECG - SR rate 62 BPM. (See cardiology reading for complete details)   PHYSICAL EXAM   Obese middle-aged Caucasian male not in distress. . Afebrile. Head is nontraumatic. Neck is supple without bruit.    Cardiac exam no murmur or gallop. Lungs are clear to auscultation. Distal pulses are well felt. Sittig  in bed. Neurological Exam : alert. Can follow 3-step  commands. Profound dysarthria. Left gaze preference but crosses the midline. PERRL. hearing intact to voice, facial droop on right, tongue deviated to the right. , sensation intact in the face and limbs, moving left arm and hand spontaneously and purposefully, can lift right arm and leg off of the best with minimal drift. Withdraws x 4. States sensation intact and equal in all limbs.    ASSESSMENT/PLAN [name] is a 54 y.o. male with history of prior stroke with residual dysarthria (ambulates with a walker), anxiety / depression, DM, PAF, ongoing tobacco use, CAD / CABG, type aortic dissection in 2014 with repair that was complicated and involved a brachiocephalic graft to the left carotid as the dissection extended into the brachiocephalic and left carotid,  aortic stenosis status post mechanical aortic valve; on 2L [place] 02 @ baseline presented to the ED as a code stroke for sudden onset of leftward gaze, aphasia, and right-sided flaccid paralysis. He had been admitted to the [facility] for weakness of the right lower extremity but was treated for mostly pneumonia and reported that he been having trouble using the right leg and walking since Christmas. Since D/C he had not been getting coumadin just the lovenox, but he did not receive lovenox this morning. Last dose lovenox  was 05/07/19 PM. He did not receive IV t-PA due to Lovenox.  IR Thrombectomy Attempt - Left A2 occlusion, remained occluded at the conclusion  Stroke: L ACA/MCA infarcts w/o attempted IR, infarcts embolic d/t mechanical valve + PAF + low EF in setting of subtherapeutic anticoagulation    Code Stroke CT Head - 2.1 x 1.1 cm focus of abnormal hypodensity within the left aspect of the callosal body and possibly also involving the pericallosal gyrus on the left.  CT head - 05/10/2019 - Non flow limiting dissection was identified in the distal cervical segment.  CTA Head - The right ICA is markedly diminutive through  the siphon region with sites of high-grade stenosis. Mixed plaque also results in mild/moderate stenosis of the supraclinoid right ICA. Abrupt occlusion of the proximal A2 left anterior cerebral artery. There appears to be some reconstitution of the left ACA distally.   CTA Neck - there is a greater stenosis within the proximal left common carotid artery than originally stated - likely moderate/severe. Thrombosis of the right innominate artery. Thrombus extends into the proximal right common carotid artery with high-grade stenosis. Sites of high-grade stenosis within the siphon region.   CT Perfusion - 125 mL region of hypoperfused parenchyma predominantly within the left ACA and ACA/MCA watershed territory, as well as right ACA/MCA watershed territory.  IR Thrombectomy Attempt - Left A2 occlusion, remained occluded at the conclusion  MRI head - L MCA/ACA infarcts   2D Echo - EF 25 to  30%.  No cardiac source of emboli identified.   Sars Corona Virus 2 - negative  LDL - 67  HgbA1c - 6.9  VTE prophylaxis - Heparin IV  Prescribed full dose lovenox and warfarin but not taking warfarin and missed last dose of lovenox, now on aspirin 325 mg and Heparin IV. Will add warfarin, decrease aspirin dose to 81  Therapy recommendations:  CIR  Disposition:  Pending  DNR at baseline  Acute Respiratory Failure, resolved  Intubated for IR  Extubated 05/10/19  CCM signed off  Atrial Fibrillation  Home anticoagulation:   Prescribed full dose lovenox and warfarin but not taking warfarin and missed last dose of lovenox,  Last INR 1.5  Now on IV heparin  Add warfarin  Plan warfarin at d/c   Hypertension / Hypotension  Home BP meds: Aldactone and Entresto  Current BP meds:   On and off levophed, on this am  SBP goal < 180  Turn off Levophed . SBP 120-130 . Long-term BP goal normotensive  Hyperlipidemia  Home Lipid lowering medication: Crestor 40 mg daily  LDL 69, goal <  70  Current lipid lowering medication: Crestor 40 mg daily  Continue statin at discharge  Diabetes  Home diabetic meds: Jardiance  Current diabetic meds: SSI   HgbA1c 6.9, goal < 7.0 Recent Labs    05/11/19 0821 05/11/19 1142 05/11/19 1555  GLUCAP 131* 90 90    Dysphagia . Secondary to stroke . MBSS . On D1 honey thick liqs . Speech on board  Other Stroke Risk Factors  Cigarette smoker will be advised to stop smoking  ETOH use, advised to drink no more than 1 alcoholic beverage per day.  Obesity, Body mass index is 32.59 kg/m., recommend weight loss, diet and exercise as appropriate   Hx stroke/TIA  05/2012 - punctate left cerebellum infarct with smaller punctate  occipital lobe infarcts. Infarcts felt to be embolic secondary to post op afib, mechanical atrial valve.   Family hx stroke (father)  Coronary artery disease - CABG  Mechanical aortic valve (anticaogulation) Vascular surgery has seen and is available as needed.  Congestive Heart Failure - EF 25 to 30% - [facility] at which time ER was notes to be 20%  Other Active Problems  LLL Pneumonia - Vancomycin started 05/08/19 (one dose only)  Leukocytosis - 18.1->16.3->14.8 (afebrile - 98.4))  Mild Acute blood loss anemia 12.7-11.7-11.1  On Neurontin  On topamax  On synthroid   Hospital day # 3  I have personally obtained history,examined this patient, reviewed notes, independently viewed imaging studies, participated in medical decision making and plan of care.ROS completed by me personally and pertinent positives fully documented  I have made any additions or clarifications directly to the above note.  Start warfarin as patient is not able to swallow.  Mobilize out of bed.  Therapy and rehab consults.  Discontinue Levophed drip.  Check MRI scan of the brain.  I had a long discussion with the patient as well as his wife at the bedside and answered  questions. This patient is critically ill and at significant risk of neurological worsening, death and care requires constant monitoring of vital signs, hemodynamics,respiratory and cardiac monitoring, extensive review of multiple databases, frequent neurological assessment, discussion with family, other specialists and medical decision making of high complexity.I have made any additions or clarifications directly to the above note.This critical care time does not reflect procedure time, or teaching time or supervisory  time of PA/NP/Med Resident etc but could involve care discussion time.  I spent 30 minutes of neurocritical care time  in the care of  this patient.      Delia Heady, MD Medical Director [facility] Pager: [phone] 05/11/2019 4:55 PM   To contact Stroke Continuity provider, please refer to [email or web address]. After hours, contact General Neurology

## 2019-05-11 NOTE — Progress Notes (Signed)
Inpatient Rehabilitation-Admissions Coordinator   Inpatient Rehab Admissions:  Inpatient Rehab Consult received.  I met with pt at the bedside for rehabilitation assessment and to discuss goals and expectations of an inpatient rehab admission. Pt appears to be a good CIR candidate. With his permission I spoke with his mom over the phone. She has concerns regarding the amount of physical assist he will need. We discussed the anticipated assist level is Supervision/Min A at DC. Pt's mom hesitant to commit to any Min A level and would like to think about it. Will follow up with pt's mom tomorrow regarding assist level.   Raechel Ache, OTR/L  Rehab Admissions Coordinator  [phone] 05/11/2019 5:07 PM

## 2019-05-11 NOTE — Discharge Instructions (Addendum)

## 2019-05-12 ENCOUNTER — Inpatient Hospital Stay (HOSPITAL_COMMUNITY): Payer: Medicaid Other

## 2019-05-12 DIAGNOSIS — E785 Hyperlipidemia, unspecified: Secondary | ICD-10-CM | POA: Diagnosis present

## 2019-05-12 DIAGNOSIS — D72829 Elevated white blood cell count, unspecified: Secondary | ICD-10-CM | POA: Diagnosis not present

## 2019-05-12 DIAGNOSIS — F17210 Nicotine dependence, cigarettes, uncomplicated: Secondary | ICD-10-CM | POA: Diagnosis present

## 2019-05-12 DIAGNOSIS — R1310 Dysphagia, unspecified: Secondary | ICD-10-CM

## 2019-05-12 DIAGNOSIS — Z823 Family history of stroke: Secondary | ICD-10-CM

## 2019-05-12 DIAGNOSIS — I10 Essential (primary) hypertension: Secondary | ICD-10-CM | POA: Diagnosis present

## 2019-05-12 DIAGNOSIS — E119 Type 2 diabetes mellitus without complications: Secondary | ICD-10-CM

## 2019-05-12 DIAGNOSIS — E669 Obesity, unspecified: Secondary | ICD-10-CM | POA: Diagnosis present

## 2019-05-12 DIAGNOSIS — D62 Acute posthemorrhagic anemia: Secondary | ICD-10-CM | POA: Diagnosis not present

## 2019-05-12 DIAGNOSIS — J690 Pneumonitis due to inhalation of food and vomit: Secondary | ICD-10-CM | POA: Diagnosis not present

## 2019-05-12 LAB — CBC WITH DIFFERENTIAL/PLATELET
Abs Immature Granulocytes: 0.18 10*3/uL — ABNORMAL HIGH (ref 0.00–0.07)
Basophils Absolute: 0 10*3/uL (ref 0.0–0.1)
Basophils Relative: 0 %
Eosinophils Absolute: 0.2 10*3/uL (ref 0.0–0.5)
Eosinophils Relative: 1 %
HCT: 34 % — ABNORMAL LOW (ref 39.0–52.0)
Hemoglobin: 11 g/dL — ABNORMAL LOW (ref 13.0–17.0)
Immature Granulocytes: 1 %
Lymphocytes Relative: 5 %
Lymphs Abs: 0.9 10*3/uL (ref 0.7–4.0)
MCH: 28.9 pg (ref 26.0–34.0)
MCHC: 32.4 g/dL (ref 30.0–36.0)
MCV: 89.2 fL (ref 80.0–100.0)
Monocytes Absolute: 1.6 10*3/uL — ABNORMAL HIGH (ref 0.1–1.0)
Monocytes Relative: 8 %
Neutro Abs: 16.9 10*3/uL — ABNORMAL HIGH (ref 1.7–7.7)
Neutrophils Relative %: 85 %
Platelets: 292 10*3/uL (ref 150–400)
RBC: 3.81 MIL/uL — ABNORMAL LOW (ref 4.22–5.81)
RDW: 16 % — ABNORMAL HIGH (ref 11.5–15.5)
WBC: 19.8 10*3/uL — ABNORMAL HIGH (ref 4.0–10.5)
nRBC: 0 % (ref 0.0–0.2)

## 2019-05-12 LAB — GLUCOSE, CAPILLARY
Glucose-Capillary: 115 mg/dL — ABNORMAL HIGH (ref 70–99)
Glucose-Capillary: 109 mg/dL — ABNORMAL HIGH (ref 70–99)
Glucose-Capillary: 70 mg/dL (ref 70–99)
Glucose-Capillary: 88 mg/dL (ref 70–99)
Glucose-Capillary: 89 mg/dL (ref 70–99)
Glucose-Capillary: 94 mg/dL (ref 70–99)
Glucose-Capillary: 91 mg/dL (ref 70–99)
Glucose-Capillary: 107 mg/dL — ABNORMAL HIGH (ref 70–99)

## 2019-05-12 LAB — BASIC METABOLIC PANEL
Anion gap: 11 (ref 5–15)
BUN: 6 mg/dL (ref 6–20)
CO2: 23 mmol/L (ref 22–32)
Calcium: 8.6 mg/dL — ABNORMAL LOW (ref 8.9–10.3)
Chloride: 104 mmol/L (ref 98–111)
Creatinine, Ser: 0.74 mg/dL (ref 0.61–1.24)
GFR calc Af Amer: >60 mL/min (ref >60)
GFR calc non Af Amer: >60 mL/min (ref >60)
Glucose, Bld: 92 mg/dL (ref 70–99)
Potassium: 3.5 mmol/L (ref 3.5–5.1)
Sodium: 138 mmol/L (ref 135–145)

## 2019-05-12 LAB — PROTIME-INR
INR: 1.3 — ABNORMAL HIGH (ref 0.8–1.2)
Prothrombin Time: 15.6 seconds — ABNORMAL HIGH (ref 11.4–15.2)

## 2019-05-12 LAB — PHOSPHORUS: Phosphorus: 4 mg/dL (ref 2.5–4.6)

## 2019-05-12 LAB — HEPARIN LEVEL (UNFRACTIONATED): Heparin Unfractionated: 0.35 IU/mL (ref 0.30–0.70)

## 2019-05-12 LAB — MAGNESIUM: Magnesium: 1.8 mg/dL (ref 1.7–2.4)

## 2019-05-12 MED ORDER — SODIUM CHLORIDE 0.9 % IV SOLN
3.0000 g | Freq: Four times a day (QID) | INTRAVENOUS | Status: DC
  Administered 2019-05-12 – 2019-05-13 (×5): 3 g via INTRAVENOUS
  Filled 2019-05-12: qty 3
  Filled 2019-05-12 (×3): qty 8
  Filled 2019-05-12 (×4): qty 3

## 2019-05-12 MED ORDER — JEVITY 1.2 CAL PO LIQD
1000.0000 mL | ORAL | Status: DC
  Administered 2019-05-12: 1000 mL
  Filled 2019-05-12 (×2): qty 1000

## 2019-05-12 MED ORDER — METRONIDAZOLE IN NACL 5-0.79 MG/ML-% IV SOLN: 500.0000 mg | Freq: Three times a day (TID) | INTRAVENOUS | Status: DC

## 2019-05-12 MED ORDER — PANTOPRAZOLE SODIUM 40 MG IV SOLR
40.0000 mg | Freq: Every day | INTRAVENOUS | Status: DC
  Administered 2019-05-12: 40 mg via INTRAVENOUS
  Filled 2019-05-12: qty 40

## 2019-05-12 MED ORDER — WARFARIN SODIUM 5 MG PO TABS
5.0000 mg | ORAL_TABLET | Freq: Once | ORAL | Status: AC
  Administered 2019-05-12: 5 mg via ORAL
  Filled 2019-05-12: qty 1

## 2019-05-12 MED ORDER — SODIUM CHLORIDE 0.9 % IV SOLN
2.0000 g | INTRAVENOUS | Status: DC
  Filled 2019-05-12: qty 20

## 2019-05-12 MED ORDER — SODIUM CHLORIDE 0.9 % IV SOLN
3.0000 g | Freq: Four times a day (QID) | INTRAVENOUS | Status: DC
  Filled 2019-05-12 (×3): qty 8

## 2019-05-12 NOTE — Procedures (Signed)
Cortrak  Person Inserting Tube:  Renie Ora, RD Tube Type:  Cortrak - 43 inches Tube Location:  Left nare Initial Placement:  Stomach Secured by: Bridle Technique Used to Measure Tube Placement:  Documented cm marking at nare/ corner of mouth Cortrak Secured At:  75 cm Procedure Comments:  Cortrak Tube Team Note:  Consult received to place a Cortrak feeding tube.   No x-ray is required. RN may begin using tube.   If the tube becomes dislodged please keep the tube and contact the Cortrak team at [email or web address] (password [email or web address]) for replacement.  If after hours and replacement cannot be delayed, place a NG tube and confirm placement with an abdominal x-ray.      Trenton Gammon, MS, RD, LDN, [facility] Inpatient Clinical Dietitian Pager # [phone] After hours/weekend pager # [phone]

## 2019-05-12 NOTE — PMR Pre-admission (Signed)
PMR Admission Coordinator Pre-Admission Assessment  Patient: Jon [name] is an 54 y.o., male MRN: [id number] DOB: 1965-08-27 Height: 5\' 11"  (180.3 cm) Weight: 106 kg              Insurance Information HMO:     PPO:      PCP:      IPA:      80/20:      OTHER:  PRIMARY: Medicaid [place]      Policy#: S      Subscriber: Patient CM Name:       Phone#:      Fax#:  Pre-Cert#:       Employer:  Benefits:  Phone #: [phone]     Name: verified eligibility online via [employer] on 05/13/2019 Coverage Code: MADCY Eff. Date: verified on 05/13/19     Deduct:       Out of Pocket Max:       Life Max:  CIR: Covered per Medicaid guidelines      SNF:  Outpatient:      Co-Pay:  Home Health:       Co-Pay:  DME:      Co-Pay:  Providers:  SECONDARY: None      Policy#:       Subscriber:  CM Name:       Phone#:      Fax#:  Pre-Cert#:       Employer:  Benefits:  Phone #:     Name:  Eff. Date:      Deduct:       Out of Pocket Max:       Life Max:  CIR:       SNF:  Outpatient:      Co-Pay:  Home Health:       Co-Pay: DME:      Co-Pay:   Medicaid Application Date:       Case Manager:  Disability Application Date:       Case Worker:   The "Data Collection Information Summary" for patients in Inpatient Rehabilitation Facilities with attached "Privacy Act Statement-Health Care Records" was provided and verbally reviewed with: N/A  Emergency Contact Information Contact Information    Name Relation Home Work Cosmos, [place] Mother [phone]  [phone]   Coyt, [place] Daughter   [phone]     Current Medical History  Patient Admitting Diagnosis: 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 He was admitted on 05/08/19 with sudden onset of leftward gaze and right  flaccid paralysis.  CT head showed abnormal hypodensity left colossal body and possible pericallosal gyrus.  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  CTA chest showed diffuse lung opacity concerning for PNA and dissection or aneurysm .  He underwent cerebral angio with attempted thrombectomy of left A2 occlusion without significant improvement in flow and nonflow limiting dissection of distal cervical segment identified.  Coumadin is subtherapeutic at admission -- multiple attempts made to contact family for input and the reported patient had been recently discharged on hospital on Lovenox twice daily however mother had not been administering injections.  2D echo showed EF of  25 to 30% with no S3.  He was placed on IV heparin and neurology felt the stroke was embolic in setting of subtherapeutic INR.  He was placed on dysphagia 1 diet with honey liquids with chin tuck due to dysphagia with aspiration of nectars on 01/03.  MRI brain done revealing multifocal restricted diffusion involving left frontoparietal lobes in both ACA and MCA territory as well as involvement of corpus callosum minor involvement of left occipital lobe question watershed and contralateral involvement of right anterolateral thalamus and right centrum ovale. He has had issues with hypotension requiring levophed off and on.  He had progressive leukocytosis with increasing cough and chest x-ray1/05 done revealing aspiration pneumonia.  He was made n.p.o. and cortak placed for nutritional support. Therapy ongoing and patient showing ability to follow simple commands with expressive aphasia, dysarthria and poor safety awareness. CIR recommended due to functional decline. Pt is to admit to CIR on 05/13/19.   Complete NIHSS TOTAL: 8 Glasgow Coma  Scale Score: 12  Past Medical History  Past Medical History:  Diagnosis Date  . Aneurysm of thoracic aorta (HCC) 05/06/2012   IMO routine update IMO routine update  . Anxiety    sees Dr. Renae Fickle  . Aortic aneurysm and dissection (HCC) 05/06/2012  . Aortic stenosis    mechanical AVR 04/22/12  . Aortic valve disorder 05/06/2012  . 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 Bicuspid aortic valve   . Cerebral infarction (HCC) 05/11/2012  . Cerebral thrombosis with cerebral infarction (HCC) 07/14/2013  . Cholelithiasis 12/20/2016  . Current use of long term anticoagulation 12/20/2016  . CVA (cerebral infarction)    occipital CVA 12/13  . Dissecting aortic aneurysm (HCC) 05/06/2012  . Dysarthria 09/10/2017  . Dysrhythmia    sees Dr. Geronimo Running, [facility] cardiology in [place]  . Generalized constriction of visual field 10/15/2012   IMOUPDATE IMOUPDATE  . 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 GERD (gastroesophageal reflux disease)   . HCAP (healthcare-associated pneumonia) 05/06/2012  . Headache(784.0)    "due to vision problem"  . History of CVA (cerebrovascular accident) 05/07/2012  . Hypothyroidism   . Idiopathic peripheral neuropathy 07/14/2013  . Late effects of CVA (cerebrovascular accident) 08/08/2015  . Migraine   . Nontraumatic multiple localized intracerebral hemorrhages (HCC) 10/16/2017  . Paroxysmal atrial fibrillation (HCC) 08/08/2015   On warfarin On warfarin  . Presence of prosthetic heart valve 08/08/2015  . Shortness of breath   . Stroke (HCC)    x3  . Stroke, embolic (HCC) 05/07/2012  . Syncope 05/06/2012  . Thoracic aortic aneurysm (HCC)    replacement aortic root 04/22/12  . VBI (vertebrobasilar insufficiency) 09/10/2017    Family History  family history includes CAD in his father; Cancer in his maternal grandfather; Diabetes in his father; Heart disease in his father and paternal grandfather; Hypertension in his father; Lung  disease in his mother; Peripheral Artery Disease in his father; Stroke in his father.  Prior Rehab/Hospitalizations:  Has the patient had prior rehab or hospitalizations prior to admission? Yes  Has the patient had major surgery during 100 days prior to admission? Yes  Current Medications   Current Facility-Administered Medications:  .  0.9 %  sodium chloride infusion, , Intravenous, Continuous, [name], MD, Stopped at 05/12/19 1458 .  acetaminophen (TYLENOL) tablet 650 mg, 650 mg, Oral, Q4H PRN **OR** acetaminophen (TYLENOL) 160 MG/5ML solution 650 mg, 650 mg, Per Tube, Q4H PRN **OR** acetaminophen (TYLENOL) suppository 650 mg,  650 mg, Rectal, Q4H PRN, Milon Dikes, MD .  Ampicillin-Sulbactam (UNASYN) 3 g in sodium chloride 0.9 % 100 mL IVPB, 3 g, Intravenous, Q6H, Biby, Sharon L, NP, Last Rate: 200 mL/hr at 05/13/19 0916, 3 g at 05/13/19 0916 .  aspirin chewable tablet 81 mg, 81 mg, Per Tube, Daily, [name], MD, 81 mg at 05/13/19 0918 .  chlorhexidine (PERIDEX) 0.12 % solution 15 mL, 15 mL, Mouth Rinse, BID, Naomie Dean B, MD, 15 mL at 05/13/19 0923 .  Chlorhexidine Gluconate Cloth 2 % PADS 6 each, 6 each, Topical, Daily, Kalman Shan, MD, 6 each at 05/13/19 1055 .  feeding supplement (JEVITY 1.2 CAL) liquid 1,000 mL, 1,000 mL, Per Tube, Continuous, [name], MD, Last Rate: 50 mL/hr at 05/12/19 2146, 1,000 mL at 05/12/19 2146 .  fentaNYL (SUBLIMAZE) injection 25-100 mcg, 25-100 mcg, Intravenous, Q2H PRN, Kalman Shan, MD, 50 mcg at 05/08/19 2226 .  gabapentin (NEURONTIN) tablet 300 mg, 300 mg, Per Tube, TID, Mosetta Anis, RPH, 300 mg at 05/13/19 4287 .  heparin ADULT infusion 100 units/mL (25000 units/232mL sodium chloride 0.45%), 1,500 Units/hr, Intravenous, Continuous, Dang, Thuy D, RPH, Last Rate: 15 mL/hr at 05/13/19 0900, 1,500 Units/hr at 05/13/19 0900 .  insulin aspart (novoLOG) injection 2-6 Units, 2-6 Units, Subcutaneous, Q4H, Kalman Shan, MD, 2 Units at 05/13/19 1150 .  levothyroxine (SYNTHROID) tablet 50 mcg, 50 mcg, Oral, Q0600, [name], MD, 50 mcg at 05/13/19 0514 .  MEDLINE mouth rinse, 15 mL, Mouth Rinse, q12n4p, Naomie Dean B, MD, 15 mL at 05/13/19 1150 .  midazolam (VERSED) injection 1-4 mg, 1-4 mg, Intravenous, Q2H PRN, Kalman Shan, MD, 2 mg at 05/09/19 0237 .  oxymetazoline (AFRIN) 0.05 % nasal spray 1 spray, 1 spray, Each Nare, BID, Gilmer Mor, DO, 1 spray at 05/13/19 1056 .  pantoprazole (PROTONIX) injection 40 mg, 40 mg, Intravenous, QHS, [name], MD, 40 mg at 05/12/19 2149 .  rosuvastatin (CRESTOR) tablet 40 mg, 40 mg, Per Tube, Daily, [name], MD, 40 mg at 05/13/19 6811 .  senna-docusate (Senokot-S) tablet 1 tablet, 1 tablet, Per Tube, QHS PRN, [name], MD .  topiramate (TOPAMAX) tablet 50 mg, 50 mg, Per Tube, Daily, [name], MD, 50 mg at 05/13/19 5726 .  warfarin (COUMADIN) tablet 7.5 mg, 7.5 mg, Oral, ONCE-1800, Dang, Thuy D, RPH .  Warfarin - Pharmacist Dosing Inpatient, , Does not apply, q1800, [facility], Given at 05/12/19 1824  Patients Current Diet:  Diet Order            Diet NPO time specified  Diet effective now              Precautions / Restrictions Precautions Precautions: Fall Precaution Comments: SBP 140-180 Restrictions Weight Bearing Restrictions: No   Has the patient had 2 or more falls or a fall with injury in the past year?Yes  Prior Activity Level Community (5-7x/wk): would get out approx 3x/week. did not work or drive PTA. Independent without an AD PTA  Prior Functional Level Prior Function Level of Independence: Independent with assistive device(s) Comments: pt reports that he lives with his mother, but was independent with ADL and mobility with an AD usually  Self Care: Did the patient need help bathing, dressing, using the toilet or eating?  Independent  Indoor Mobility: Did the patient need assistance  with walking from room to room (with or without device)? Independent  Stairs: Did the patient need  assistance with internal or external stairs (with or without device)? Independent  Functional Cognition: Did the patient need help planning regular tasks such as shopping or remembering to take medications? Independent  Home Equities trader / Equipment Home Equipment: Shower seat, Bedside commode, Environmental consultant - 2 wheels, Cane - single point, Wheelchair - manual  Prior Device Use: Indicate devices/aids used by the patient prior to current illness, exacerbation or injury? None of the above  Current Functional Level Cognition  Overall Cognitive Status: Difficult to assess Difficult to assess due to: Impaired communication Orientation Level: Oriented X4 General Comments: pt with expressive aphasia answering yes/no questions appropriately and attempting to expand upon statements but difficult to understand    Extremity Assessment (includes Sensation/Coordination)  Upper Extremity Assessment: Defer to OT evaluation RUE Deficits / Details: Shoulder 3+/5 MM grade, elbow through hand with 4/5 MM grade, AROM, WFLs. Pt with decreased grip strength RUE Sensation: (inconsistent with sensation on R side) RUE Coordination: decreased fine motor, decreased gross motor  Lower Extremity Assessment: RLE deficits/detail RLE Deficits / Details: hip flexion 4/5, knee flexion and extension 5/5.    ADLs  Overall ADL's : Needs assistance/impaired Eating/Feeding: Modified independent, Sitting Grooming: Min guard, Sitting Upper Body Bathing: Minimal assistance, Sitting Lower Body Bathing: Moderate assistance, Cueing for safety, Sitting/lateral leans, Sit to/from stand Upper Body Dressing : Minimal assistance, Sitting Lower Body Dressing: Moderate assistance, Sitting/lateral leans, Sit to/from stand Toilet Transfer: Moderate assistance, +2 for physical assistance, +2 for safety/equipment, Cueing for safety, Cueing  for sequencing, Ambulation Toileting- Clothing Manipulation and Hygiene: Moderate assistance, +2 for physical assistance, +2 for safety/equipment, Sitting/lateral lean, Sit to/from stand Functional mobility during ADLs: Minimal assistance, Moderate assistance, +2 for physical assistance, +2 for safety/equipment, Cueing for safety, Cueing for sequencing General ADL Comments: Pt limited by poor strength in R side, poor mobility and decreased activity tolerance    Mobility  Overal bed mobility: Needs Assistance Bed Mobility: Supine to Sit Rolling: Mod assist Sidelying to sit: Mod assist(via R side) Supine to sit: Min assist General bed mobility comments: pt bridged to EOB with cuing only, He attempted to throw his legs off the bed to build momentum, but still needed minimal truncal assist.    Transfers  Overall transfer level: Needs assistance Equipment used: None Transfers: Sit to/from Stand Sit to Stand: Mod assist Stand pivot transfers: Mod assist, +2 physical assistance, +2 safety/equipment, From elevated surface General transfer comment: cues for appropriate hand placement, assist to come forward and up, guarding R LE.    Ambulation / Gait / Stairs / Wheelchair Mobility  Ambulation/Gait Ambulation/Gait assistance: Mod assist, +2 physical assistance, +2 safety/equipment Gait Distance (Feet): 40 Feet Assistive device: None Gait Pattern/deviations: Step-through pattern, Step-to pattern, Decreased step length - left, Decreased stance time - right, Decreased step length - right, Decreased stride length General Gait Details: Use "three muskateer" technique (pt's arms over therapists shoulders) to ambulate  allowing more w/shift help and allowing pt to concentrate on stepping with the right. Gait velocity: slower Gait velocity interpretation: <1.8 ft/sec, indicate of risk for recurrent falls    Posture / Balance Dynamic Sitting Balance Sitting balance - Comments: able to sit without UE  support Balance Overall balance assessment: Needs assistance Sitting-balance support: Single extremity supported, No upper extremity supported Sitting balance-Leahy Scale: Fair Sitting balance - Comments: able to sit without UE support Standing balance support: Bilateral upper extremity supported, Single extremity supported Standing balance-Leahy Scale: Poor Standing balance comment: pt reached for and held to the  footboard and then could w/bear on both LE's with a bias toward the R.    Special needs/care consideration BiPAP/CPAP: no CPM: no Continuous Drip IV: 0.9% sodium chloride infusion, feeding supplement, heparin ADULT infusion  Dialysis: no        Days: no Life Vest: no Oxygen: 3L/min Special Bed: no Trach Size: no Wound Vac (area): no      Location: no Skin: ecchymosis to flank, arm, hand (left; right), incision to anterior, proximal, right groin                    Bowel mgmt: no BM charted.  Bladder mgmt: external catheter in place Diabetic mgmt: no Behavioral consideration : appears frustrated with aphasia  Chemo/radiation : no     Previous Home Environment (from acute therapy documentation) Living Arrangements: Parent Available Help at Discharge: Family, Available 24 hours/day Type of Home: House Home Layout: One level Home Access: Stairs to enter Entergy CorporationEntrance Stairs-Number of Steps: 3-5 (back), 3 front Bathroom Shower/Tub: Engineer, manufacturing systemsTub/shower unit Bathroom Toilet: Standard Additional Comments: Info from pt s/p extubation  Discharge Living Setting Plans for Discharge Living Setting: Other (Comment)(plans to stay at his sister-in-laws house (brother's house)) Type of Home at Discharge: House Discharge Home Layout: One level Discharge Home Access: Stairs to enter Entrance Stairs-Rails: None Entrance Stairs-Number of Steps: 2 Discharge Bathroom Shower/Tub: Tub/shower unit Discharge Bathroom Toilet: Standard Discharge Bathroom Accessibility: Yes How Accessible: Accessible  via walker Does the patient have any problems obtaining your medications?: No  Social/Family/Support Systems Patient Roles: Other (Comment)(has good family support; was living [place] mom in [place]) SolicitorContact Information: sister in law: Boyd Kerbsenny [phone]); Lura Ematsy (mother): [phone] Anticipated Caregiver: Boyd Kerbsenny (main caregiver as she is home 24/7), his brother, and his mom Patsy as needed Anticipated Industrial/product designerCaregiver's Contact Information: see above Ability/Limitations of Caregiver: Min A Caregiver Availability: 24/7 Discharge Plan Discussed with Primary Caregiver: Yes(pt, Patsy, and Penny) Is Caregiver In Agreement with Plan?: Yes Does Caregiver/Family have Issues with Lodging/Transportation while Pt is in Rehab?: No   Goals/Additional Needs Patient/Family Goal for Rehab: PT/OT: Supervision to Min A; SLP: Supervision to Min A Expected length of stay: 21-25 days Cultural Considerations: NA Dietary Needs: NPO  Equipment Needs: TBD Pt/Family Agrees to Admission and willing to participate: Yes Program Orientation Provided & Reviewed with Pt/Caregiver Including Roles  & Responsibilities: Yes(pt, Patsy, and Boyd Kerbsenny)  Barriers to Discharge: Home environment access/layout, Insurance for SNF coverage, Nutrition means  Barriers to Discharge Comments: Cortrak, steps to enter home, will need to be a one person Min A to return home safely   Decrease burden of Care through IP rehab admission: NA   Possible need for SNF placement upon discharge:Not anticipated. Pt has good social support at DC from his mother and his sister-in-law. Plan is for pt to return home at Supervision/Min A level which is a level his sister-in-law is able to provide as she works from home and can provide light physical assist 24/7. Pt and family are aware the plan is for pt to DC to Penny's house after CIR stay.    Patient Condition: This patient's medical and functional status has changed since the consult dated: 05/11/19 in  which the Rehabilitation Physician determined and documented that the patient's condition is appropriate for intensive rehabilitative care in an inpatient rehabilitation facility. See "History of Present Illness" (above) for medical update. Functional changes are: improvement in ambulation distance from Mod A +2 for 25 feet to Mod A +2 for 40 feet. Patient's  medical and functional status update has been discussed with the Rehabilitation physician and patient remains appropriate for inpatient rehabilitation. Will admit to inpatient rehab today.  Preadmission Screen Completed By:  Cheri RousKelly Gentry, OT, 05/13/2019 12:10 PM ______________________________________________________________________   Discussed status with Dr. Allena KatzPatel on 1/6/201 at 12:10PM and received approval for admission today.  Admission Coordinator:  Cheri RousKelly Gentry, time 12:10PM Dorna Bloom/Date 05/13/19

## 2019-05-12 NOTE — Discharge Summary (Addendum)
Stroke Discharge Summary  Patient ID: Jon [name]   MRN: [id number]      DOB: Aug 22, 1965  Date of Admission: 05/08/2019 Date of Discharge: 05/13/2019  Attending Physician:  Jon [name], MD, Stroke MD Consultant(s):  Treatment Jon Shan, MD (pulmonary/intensive care), Jon Slot, MD (vascular surgery), Jon Laws, MD (Physical Medicine & Rehabtilitation)  Patient's PCP:  Jon [name]., MD  Discharge Diagnoses:  Principal Problem:   Acute ischemic stroke (HCC) L MCA s/p attempted IR, embolic d/t PAF, low EF and subtherapeutic AC Active Problems:   Late effects of CVA (cerebrovascular accident)   Dysarthria   History of CVA (cerebrovascular accident)   Paroxysmal atrial fibrillation (HCC)   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   Stroke (cerebrum) [facility])   Essential hypertension   Hyperlipidemia   Diabetes mellitus type II, controlled (HCC)   Dysphagia   Aspiration pneumonia (HCC)   Leukocytosis   Cigarette smoker   Obesity   Family hx-stroke   Acute blood loss anemia   Medications to be continued on Rehab Allergies as of 05/13/2019      Reactions   Hydrocodone-acetaminophen Other (See Comments)   PT MOTHER DOES NOT REMEMBER REACTION  Only use in Emergency   Hydromorphone Itching, Rash      Other Other (See Comments)   ALL NARCOTICS-ITCHING, RASH   Oxycodone Other (See Comments)   Unknown reaction   Penicillins Rash   Did it involve swelling of the face/tongue/throat, SOB, or low BP? Unknown Did it involve sudden or severe rash/hives, skin peeling, or any reaction on the inside of your mouth or nose? Unknown Did you need to seek medical attention at a [name] or doctor's office? Unknown When did it last happen?unknown If all above answers are "NO", may proceed with cephalosporin use.   Dilaudid [hydromorphone Hcl] Itching, Rash      Medication List    STOP taking these medications   acetaminophen 500 MG  tablet Commonly known as: TYLENOL Replaced by: acetaminophen 160 MG/5ML solution   albuterol 108 (90 Base) MCG/ACT inhaler Commonly known as: VENTOLIN HFA   amiodarone 200 MG tablet Commonly known as: PACERONE   dicyclomine 20 MG tablet Commonly known as: BENTYL   DULoxetine 30 MG capsule Commonly known as: CYMBALTA   enoxaparin 100 MG/ML injection Commonly known as: LOVENOX   Entresto 24-26 MG Generic drug: sacubitril-valsartan   furosemide 20 MG tablet Commonly known as: LASIX   gabapentin 300 MG capsule Commonly known as: NEURONTIN Replaced by: gabapentin 600 MG tablet   ipratropium-albuterol 0.5-2.5 (3) MG/3ML Soln Commonly known as: DUONEB   Jardiance 10 MG Tabs tablet Generic drug: empagliflozin   omeprazole 20 MG capsule Commonly known as: PRILOSEC   Spiriva HandiHaler 18 MCG inhalation capsule Generic drug: tiotropium   spironolactone 25 MG tablet Commonly known as: ALDACTONE     TAKE these medications   acetaminophen 160 MG/5ML solution Commonly known as: TYLENOL Place 20.3 mLs (650 mg total) into feeding tube every 4 (four) hours as needed for mild pain (or temp > 37.5 C (99.5 F)). Replaces: acetaminophen 500 MG tablet   Ampicillin-Sulbactam 3 g in sodium chloride 0.9 % 100 mL Inject 3 g into the vein every 6 (six) hours.   aspirin 81 MG chewable tablet Place 1 tablet (81 mg total) into feeding tube daily. Start taking on: May 14, 2019 What changed: how to take this   chlorhexidine 0.12 % solution Commonly known as:  PERIDEX 15 mLs by Mouth Rinse route 2 (two) times daily.   Chlorhexidine Gluconate Cloth 2 % Pads Apply 6 each topically daily. Start taking on: May 14, 2019   feeding supplement (JEVITY 1.2 CAL) Liqd Place 1,000 mLs into feeding tube continuous.   gabapentin 600 MG tablet Commonly known as: NEURONTIN Place 0.5 tablets (300 mg total) into feeding tube 3 (three) times daily. Replaces: gabapentin 300 MG capsule    heparin 25000-0.45 UT/250ML-% infusion Inject 1,500 Units/hr into the vein continuous.   insulin aspart 100 UNIT/ML injection Commonly known as: novoLOG Inject 2-6 Units into the skin every 4 (four) hours.   levothyroxine 50 MCG tablet Commonly known as: SYNTHROID Take 1 tablet (50 mcg total) by mouth daily at 6 (six) AM. Start taking on: May 14, 2019 What changed: when to take this   mouth rinse Liqd solution 15 mLs by Mouth Rinse route 2 times daily at 12 noon and 4 pm.   oxymetazoline 0.05 % nasal spray Commonly known as: AFRIN Place 1 spray into both nostrils 2 (two) times daily.   pantoprazole 40 MG injection Commonly known as: PROTONIX Inject 40 mg into the vein at bedtime.   rosuvastatin 40 MG tablet Commonly known as: CRESTOR Place 1 tablet (40 mg total) into feeding tube daily. Start taking on: May 14, 2019 What changed:   how to take this  when to take this   senna-docusate 8.6-50 MG tablet Commonly known as: Senokot-S Place 1 tablet into feeding tube at bedtime as needed for mild constipation.   sodium chloride 0.9 % infusion Inject 50 mLs into the vein continuous.   topiramate 50 MG tablet Commonly known as: TOPAMAX Place 1 tablet (50 mg total) into feeding tube daily. Start taking on: May 14, 2019 What changed:   how to take this  when to take this   warfarin 7.5 MG tablet Commonly known as: COUMADIN Take 1 tablet (7.5 mg total) by mouth one time only at 6 PM. What changed:   medication strength  how much to take  how to take this  when to take this  additional instructions       LABORATORY STUDIES CBC    Component Value Date/Time   WBC 17.8 (H) 05/13/2019 0330   RBC 4.00 (L) 05/13/2019 0330   HGB 11.4 (L) 05/13/2019 0330   HGB 13.8 03/04/2019 1518   HCT 36.0 (L) 05/13/2019 0330   HCT 42.7 03/04/2019 1518   PLT 263 05/13/2019 0330   PLT 252 03/04/2019 1518   MCV 90.0 05/13/2019 0330   MCV 94 03/04/2019 1518    MCH 28.5 05/13/2019 0330   MCHC 31.7 05/13/2019 0330   RDW 16.4 (H) 05/13/2019 0330   RDW 13.9 03/04/2019 1518   LYMPHSABS 0.9 05/12/2019 0716   MONOABS 1.6 (H) 05/12/2019 0716   EOSABS 0.2 05/12/2019 0716   BASOSABS 0.0 05/12/2019 0716   CMP    Component Value Date/Time   NA 138 05/13/2019 0330   NA 140 03/30/2019 0819   K 3.4 (L) 05/13/2019 0330   CL 103 05/13/2019 0330   CO2 25 05/13/2019 0330   GLUCOSE 132 (H) 05/13/2019 0330   BUN 7 05/13/2019 0330   BUN 13 03/30/2019 0819   CREATININE 0.79 05/13/2019 0330   CALCIUM 8.5 (L) 05/13/2019 0330   PROT 5.3 (L) 05/09/2019 0112   ALBUMIN 2.6 (L) 05/09/2019 0112   AST 34 05/09/2019 0112   ALT 63 (H) 05/09/2019 0112   ALKPHOS 116 05/09/2019  0112   BILITOT 0.6 05/09/2019 0112   GFRNONAA >60 05/13/2019 0330   GFRAA >60 05/13/2019 0330   COAGS Lab Results  Component Value Date   INR 1.1 05/13/2019   INR 1.3 (H) 05/12/2019   INR 1.5 (H) 05/09/2019   Lipid Panel    Component Value Date/Time   CHOL 132 05/09/2019 0504   TRIG 157 (H) 05/09/2019 0504   HDL 34 (L) 05/09/2019 0504   CHOLHDL 3.9 05/09/2019 0504   VLDL 31 05/09/2019 0504   LDLCALC 67 05/09/2019 0504   HgbA1C  Lab Results  Component Value Date   HGBA1C 6.9 (H) 05/09/2019   SIGNIFICANT DIAGNOSTIC STUDIES CT Code Stroke CTA Head W/WO contrast  Addendum Date: 05/08/2019   ADDENDUM REPORT: 05/08/2019 11:48 ADDENDUM: Upon further review, there is likely greater stenosis within the proximal left common carotid artery than originally stated. This is on the basis of circumferential plaque or thrombus. Exact quantification of stenosis is difficult due to vessel tortuosity, but likely moderate/severe. Addended findings called by telephone at the time of interpretation on 05/08/2019 at 11:47 am to provider Jon [name] , who verbally acknowledged these results. Electronically Signed   By: Jackey Loge DO   On: 05/08/2019 11:48   Result Date: 05/08/2019 CLINICAL DATA:   Stroke, follow-up. EXAM: CT ANGIOGRAPHY HEAD AND NECK CT PERFUSION BRAIN TECHNIQUE: Multidetector CT imaging of the head and neck was performed using the standard protocol during bolus administration of intravenous contrast. Multiplanar CT image reconstructions and MIPs were obtained to evaluate the vascular anatomy. Carotid stenosis measurements (when applicable) are obtained utilizing NASCET criteria, using the distal internal carotid diameter as the denominator. CONTRAST:  Administered contrast not known at this time. 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 The proximal right common carotid artery also appears partially thrombosed with high-grade stenosis. Right carotid system: Clot within the proximal right common carotid artery with high-grade stenosis. The stenosis gradually diminishes within the distal common carotid artery. There is circumferential hypoattenuation within the distal common which may reflect plaque or eccentric thrombus. Eccentric plaque is also present within the carotid bulb without significant narrowing. The there is abrupt caliber change towards the cephalad aspect of the carotid bulb with the cervical ICA significantly asymmetrically diminutive as compared to the right throughout the remainder of the neck and through the siphon region with sites of high-grade stenosis within the siphon region. Left carotid system: The common carotid artery is patent without significant stenosis (50% or greater). Eccentric plaque or thrombus within the proximal common carotid artery. The ICA is patent without significant stenosis. Mild mixed plaque within the left carotid bulb. Vertebral arteries: Left  vertebral artery slightly dominant. The bilateral vertebral arteries are patent throughout the neck without significant stenosis. Skeleton: No acute bony abnormality. Other neck: No soft tissue neck mass or cervical lymphadenopathy. Upper chest: Partially imaged extensive airspace opacities within the left lung apex suspicious for pneumonia. Review of the MIP images confirms the above findings CTA HEAD FINDINGS Anterior circulation: Markedly diminutive intracranial right ICA through the siphon region with sites of high-grade stenosis. Mixed plaque also results in mild-to-moderate narrowing of the supraclinoid right ICA. The M1 right middle cerebral artery is patent without significant proximal stenosis. Atherosclerotic irregularity of M2  right MCA branches with no proximal branch occlusion identified. The right anterior cerebral artery is patent without proximal branch occlusion or high-grade proximal stenosis. The M1 left middle cerebral artery is patent without significant stenosis. Atherosclerotic irregularity of M2 left MCA branch vessels without proximal branch occlusion identified. Abrupt occlusion of the proximal A2 left anterior cerebral artery. There appears to be some reconstitution of the distal left ACA. Posterior circulation: The intracranial vertebral arteries are patent without significant stenosis, as is the basilar artery. The bilateral posterior cerebral arteries are patent without proximal branch occlusion or high-grade proximal arterial stenosis. Posterior communicating arteries are hypoplastic or absent bilaterally. Venous sinuses: Within limitations of contrast timing, no convincing thrombus. Anatomic variants: As described Review of the MIP images confirms the above findings These results were called by telephone at the time of interpretation on 05/08/2019 at 11:15 am to provider Emerson [name] , who verbally acknowledged these results. IMPRESSION: IMPRESSION CTA neck: 1. 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 2. 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 Thrombus extends into the proximal right common carotid artery with high-grade stenosis. There is a progressive decrease in this stenosis towards the distal CCA. There is abrupt caliber change of the cervical right ICA just distal to the bulb. New from prior examinations, the cervical ICA is markedly asymmetrically diminutive throughout the neck and through the siphon region. Sites of high-grade stenosis within the siphon region. 3. The left common and cervical internal carotid arteries are patent without significant stenosis. Circumferential plaque or thrombus within the proximal left common carotid artery. 4. Bilateral vertebral arteries patent within the neck without significant stenosis. 5. Extensive airspace disease within the partially imaged left lung apex suspicious for pneumonia. CTA head: 1. The right ICA is markedly diminutive through the siphon region with sites of high-grade stenosis. Mixed plaque also results in mild/moderate stenosis of the supraclinoid right ICA. 2. Abrupt occlusion of the proximal A2 left anterior cerebral artery. There appears to be some reconstitution of the left ACA distally. 3. No other intracranial large vessel occlusion or proximal high-grade arterial stenosis is identified. Electronically Signed: By: Jackey Loge DO On: 05/08/2019 11:39   CT Code Stroke CTA Neck W/WO contrast  Addendum Date: 05/08/2019   ADDENDUM REPORT: 05/08/2019 11:48 ADDENDUM: Upon further review, there is likely greater stenosis within the proximal left common carotid artery than originally stated. This is on the basis of circumferential plaque or thrombus. Exact quantification of stenosis is difficult due to vessel tortuosity, but likely moderate/severe. Addended findings called by telephone at the time of interpretation on 05/08/2019 at  11:47 am to provider [facility] , who verbally acknowledged these results. Electronically Signed   By: Jackey Loge DO   On: 05/08/2019 11:48   Result Date: 05/08/2019 CLINICAL DATA:  Stroke, follow-up. EXAM: CT ANGIOGRAPHY HEAD AND NECK CT PERFUSION BRAIN TECHNIQUE: Multidetector CT imaging of the head and neck was performed using the standard protocol during bolus administration of intravenous contrast. Multiplanar CT image reconstructions and MIPs were obtained to evaluate the vascular anatomy. Carotid stenosis measurements (when applicable) are obtained utilizing NASCET criteria, using the distal internal carotid diameter as the denominator. CONTRAST:  Administered contrast not known at this time. COMPARISON:  Same day noncontrast CT head, CT angiogram neck 07/08/2012, CT angiogram chest 04/29/2019, CT angiogram chest 10/26/2010 FINDINGS: CTA NECK FINDINGS Aortic arch: Redemonstrated sequela of previous graft repair of ascending  thoracic aorta and aortic arch. 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 The proximal right common carotid artery also appears partially thrombosed with high-grade stenosis. Right carotid system: Clot within the proximal right common carotid artery with high-grade stenosis. The stenosis gradually diminishes within the distal common carotid artery. There is circumferential hypoattenuation within the distal common which may reflect plaque or eccentric thrombus. Eccentric plaque is also present within the carotid bulb without significant narrowing. The there is abrupt caliber change towards the cephalad aspect of the carotid bulb with the cervical ICA significantly asymmetrically diminutive as compared to the right throughout the remainder of the neck and through the siphon region with sites of high-grade stenosis within the siphon region. Left carotid system: The common carotid artery is patent without significant stenosis (50% or greater).  Eccentric plaque or thrombus within the proximal common carotid artery. The ICA is patent without significant stenosis. Mild mixed plaque within the left carotid bulb. Vertebral arteries: Left vertebral artery slightly dominant. The bilateral vertebral arteries are patent throughout the neck without significant stenosis. Skeleton: No acute bony abnormality. Other neck: No soft tissue neck mass or cervical lymphadenopathy. Upper chest: Partially imaged extensive airspace opacities within the left lung apex suspicious for pneumonia. Review of the MIP images confirms the above findings CTA HEAD FINDINGS Anterior circulation: Markedly diminutive intracranial right ICA through the siphon region with sites of high-grade stenosis. Mixed plaque also results in mild-to-moderate narrowing of the supraclinoid right ICA. The M1 right middle cerebral artery is patent without significant proximal stenosis. Atherosclerotic irregularity of M2 right MCA branches with no proximal branch occlusion identified. The right anterior cerebral artery is patent without proximal branch occlusion or high-grade proximal stenosis. The M1 left middle cerebral artery is patent without significant stenosis. Atherosclerotic irregularity of M2 left MCA branch vessels without proximal branch occlusion identified. Abrupt occlusion of the proximal A2 left anterior cerebral artery. There appears to be some reconstitution of the distal left ACA. Posterior circulation: The intracranial vertebral arteries are patent without significant stenosis, as is the basilar artery. The bilateral posterior cerebral arteries are patent without proximal branch occlusion or high-grade proximal arterial stenosis. Posterior communicating arteries are hypoplastic or absent bilaterally. Venous sinuses: Within limitations of contrast timing, no convincing thrombus. Anatomic variants: As described Review of the MIP images confirms the above findings These results were called by  telephone at the time of interpretation on 05/08/2019 at 11:15 am to provider [facility] , who verbally acknowledged these results. IMPRESSION: IMPRESSION CTA neck: 1. 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 2. 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 Thrombus extends into the proximal right common carotid artery with high-grade stenosis. There is a progressive decrease in this stenosis towards the distal CCA. There is abrupt caliber change of the cervical right ICA just distal to the bulb. New from prior examinations, the cervical ICA is markedly asymmetrically diminutive throughout the neck and through the siphon region. Sites of high-grade stenosis within the siphon region. 3. The left common and cervical internal carotid arteries are patent without significant stenosis. Circumferential plaque or thrombus within the proximal left common carotid artery. 4. Bilateral vertebral arteries patent within the neck without significant stenosis. 5. Extensive airspace disease within the partially imaged left lung apex suspicious for pneumonia. CTA head: 1. The right ICA is markedly diminutive through the siphon region with sites of high-grade stenosis.  Mixed plaque also results in mild/moderate stenosis of the supraclinoid right ICA. 2. Abrupt occlusion of the proximal A2 left anterior cerebral artery. There appears to be some reconstitution of the left ACA distally. 3. No other intracranial large vessel occlusion or proximal high-grade arterial stenosis is identified. Electronically Signed: By: Jackey Loge DO On: 05/08/2019 11:39   MR BRAIN WO CONTRAST  Result Date: 05/11/2019 CLINICAL DATA:  Stroke, follow-up EXAM: MRI HEAD WITHOUT CONTRAST TECHNIQUE: Multiplanar, multiecho pulse sequences of the brain and surrounding structures were obtained without intravenous contrast. COMPARISON:  MRI 2013, recent CT imaging  FINDINGS: Brain: There is multifocal restricted diffusion including involvement of the left frontoparietal lobes in both ACA and MCA territories. There is involvement of the corpus callosum as identified on prior CT. Minor involvement of the left occipital lobe may reflect watershed territory. There is also contralateral involvement of the right anterolateral thalamus and right centrum semiovale. A few small foci of susceptibility in the supratentorial subcortical white matter and cerebellum likely reflect chronic microhemorrhages. There is no intracranial mass or significant mass effect. There is no hydrocephalus or extra-axial fluid collection. Vascular: Major vessel flow voids at the skull base are preserved. Skull and upper cervical spine: Normal marrow signal is preserved. Sinuses/Orbits: Paranasal sinuses are aerated. Orbits are unremarkable. Other: Sella is unremarkable.  Mastoid air cells are clear. IMPRESSION: Multiple acute infarcts as detailed above primarily involving left ACA and MCA territories. Electronically Signed   By: Guadlupe Spanish M.D.   On: 05/11/2019 10:51   IR CT Head Ltd  Result Date: 05/10/2019 INDICATION: 54 year old male presents with acute altered mental status, symptoms of new right lower extremity weakness, NIH stroke scale greater than 8. Imaging workup demonstrates a new occlusion the innominate artery and left A2 occlusion. 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 EXAM: ULTRASOUND GUIDED ACCESS RIGHT COMMON FEMORAL ARTERY CERVICAL AND CEREBRAL ANGIOGRAM MECHANICAL THROMBECTOMY OF LEFT ACA DEPLOYMENT OF ANGIO-SEAL FOR HEMOSTASIS COMPARISON:  CT IMAGING SAME DAY MEDICATIONS: 1 g vancomycin. The antibiotic was administered within 1 hour of the procedure ANESTHESIA/SEDATION: The anesthesia team was present to provide general endotracheal tube anesthesia and for patient monitoring during the procedure. Interventional neuro radiology nursing staff was also present. CONTRAST:  85 cc  IV contrast FLUOROSCOPY TIME:  Fluoroscopy Time: 47 minutes 12 seconds (3,084 mGy). COMPLICATIONS: SIR LEVEL B - Normal therapy, includes overnight admission for observation. TECHNIQUE: Informed written consent was obtained from the patient's family after a thorough discussion of the procedural risks, benefits and alternatives. Specific risks discussed include: Bleeding, infection, contrast reaction, kidney injury/failure, need for further procedure/surgery, arterial injury or dissection, embolization to new territory, intracranial hemorrhage (10-15% risk), neurologic deterioration, cardiopulmonary collapse, death. All questions were addressed. Maximal Sterile Barrier Technique was utilized including during the procedure including caps, mask, sterile gowns, sterile gloves, sterile drape, hand hygiene and skin antiseptic. A timeout was performed prior to the initiation of the procedure. The anesthesia team was present to provide general endotracheal tube anesthesia and for patient monitoring during the procedure. Interventional neuro radiology nursing staff was also present. FINDINGS: Initial Findings: Left common carotid artery: Intimal irregularity at the origin of the left common carotid artery corresponds to the patient's postsurgical changes. No high-grade stenosis or intimal filling defect. Left external carotid artery: Patent with antegrade flow. Left internal carotid artery: Normal course caliber and contour of the cervical portion. Vertical and petrous segment patent with normal course caliber contour. Cavernous segment patent. Clinoid segment patent. Antegrade flow of  the ophthalmic artery. Ophthalmic segment patent. Terminus patent. Left MCA: M1 segment patent. Insular and opercular segments patent. Unremarkable caliber and course of the cortical segments. Typical arterial, capillary/ parenchymal, and venous phase. Left ACA: A1 segment patent with adequate cross flow through patent anterior communicating  artery. Left A2 occluded Completion Findings: Non flow limiting dissection at the distal cervical ICA, in the left ICA. After 4 passes of left a 2 segment there was restoration of TICU 2b flow, although we then withdrew to the cervical segment 4 a final angiogram. Final image demonstrates no flow into the left A2 segment, TICI 0. Flat panel CT demonstrates no complicating features. PROCEDURE: Patient is brought emergently to the neuro angiography suite, with the patient identified appropriately and placed supine position on the table. Left radial arterial line was placed by the anesthesia team. The patient is then prepped and draped in the usual sterile fashion. Ultrasound survey of the right inguinal region was performed with images stored and sent to PACs. 11 blade scalpel was used to make a small incision. Blunt dissection was performed. A micropuncture needle was used access the right common femoral artery under ultrasound. With excellent arterial blood flow returned, an .018 micro wire was passed through the needle, observed to enter the abdominal aorta under fluoroscopy. The needle was removed, and a micropuncture sheath was placed over the wire. The inner dilator and wire were removed, and an 035 Bentson wire was advanced under fluoroscopy into the abdominal aorta. The sheath was removed and a standard 5 [place] vascular sheath was placed. The dilator was removed and the sheath was flushed. A 70F JB-1 diagnostic catheter was advanced over the wire to the proximal descending thoracic aorta. Wire was then removed. Double flush of the catheter was performed. Catheter was then used to select the left common carotid artery. The catheter wire combination were used to navigate to the distal cervical segment. Angiogram was performed. After the angiogram was performed, a non flow limiting dissection was identified in the distal cervical segment at a point of inflection. We observed this for an interval of approximately  2 minutes, I spoke with neurologist on-call, and then we proceeded with treatment. Formal angiogram was performed, with roadmap achieved. Exchange length Rosen wire was then passed through the diagnostic catheter to the distal cervical ICA and the diagnostic catheter was removed. The 5 French sheath was removed and exchanged for 8 French 55 centimeter BrightTip sheath. Sheath was flushed and attached to pressurized and heparinized saline bag for constant forward flow. Eight French 95 cm Walrus balloon catheter was then advanced over the wire to the distal cervical segment. Wire was removed. Then a coaxial intermediate catheter and microcatheter combination was prepared on the back table. This combination was Penumbra Jet-D catheter and a Velcity microcatheter, with a synchro soft wire. This combination was then advanced through the balloon guide into the ICA. System was advanced through the internal carotid artery, to the supraclinoid segment. The micro wire was then carefully advanced into the A1 segment. Occluded left a 2 segment without prolapsing into the left MCA. The microwire microcatheter were then navigated through the patent anterior communicating artery for further wire purchase. Once the wire and microcatheter within a proximal M2 right-sided branch, the jet D catheter was advanced to the a 1 segment. With the intermediate catheter in the A1 segment, the microcatheter and microwire were withdrawn. Recognizing that we would not be able to pass the jet D catheter as a primary aspiration device,  we elected to proceed with a penumbra 3 max aspiration catheter and the synchro soft wire. Synchro soft wire and the 3 max catheter were passed into the A2 segment proximally. Wire was removed and then aspiration was performed. A total of 3 primary aspiration (ADAPT) passes were performed with the jet D catheter in the A1 segment and using the 3 max catheter. Three attempts with primary aspiration was successful in  aspirating thrombus, with partial flow restored. Final attempt was performed with mechanical thrombectomy. Microwire and microcatheter were placed into the A2 segment, with a combination of the synchro soft wire a trevo proview 18 catheter. A 4 x 40 solitaire device was then deployed through the microcatheter with 3 minute interval observed. The retriever device was then withdrawn into the intermediate catheter, as we did not want to withdraw the whole system below the site of dissection into the balloon guide. Final angiogram confirmed partial restoration of flow. After this fourth pass was performed we decided to withdraw from any further attempts. The intermediate catheter was then withdrawn into the balloon guide for a final angiogram. Final angiogram demonstrated improved appearance of the dissection site, however, there was now absent flow through the A2 segment on the left. All sheaths wires catheters were removed, and Angio-Seal was deployed for hemostasis. Patient tolerated the procedure well and remained hemodynamically stable throughout. No complications were encountered. Estimated blood loss approximately 50 cc. IMPRESSION: Status post ultrasound guided access right common femoral artery for left-sided cervical and cerebral angiogram and attempted thrombectomy of left A2 occlusion, with 3 passes primary aspiration and a single pass of mechanical thrombectomy yielding no significant improvement in flow through the A2 segment. Non flow limiting dissection was identified in the distal cervical segment. Signed, Dulcy Fanny. Dellia Nims, [place] [facility] Radiology PLAN: To the ICU for management, including ventilation management Patient remained intubated Blood pressure goal of 016 systolic for the first 24 hours Antiplatelets are indicated Right hip straight time 6 hours Electronically Signed   By: Corrie Mckusick D.O.   On: 05/10/2019 01:12   IR US Guide Vasc  Access Right  Result Date: 05/10/2019 INDICATION: 54 year old male presents with acute altered mental status, symptoms of new right lower extremity weakness, NIH stroke scale greater than 8. Imaging workup demonstrates a new occlusion the innominate artery and left A2 occlusion. 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 EXAM: ULTRASOUND GUIDED ACCESS RIGHT COMMON FEMORAL ARTERY CERVICAL AND CEREBRAL ANGIOGRAM MECHANICAL THROMBECTOMY OF LEFT ACA DEPLOYMENT OF ANGIO-SEAL FOR HEMOSTASIS COMPARISON:  CT IMAGING SAME DAY MEDICATIONS: 1 g vancomycin. The antibiotic was administered within 1 hour of the procedure ANESTHESIA/SEDATION: The anesthesia team was present to provide general endotracheal tube anesthesia and for patient monitoring during the procedure. Interventional neuro radiology nursing staff was also present. CONTRAST:  85 cc IV contrast FLUOROSCOPY TIME:  Fluoroscopy Time: 47 minutes 12 seconds (3,084 mGy). COMPLICATIONS: SIR LEVEL B - Normal therapy, includes overnight admission for observation. TECHNIQUE: Informed written consent was obtained from the patient's family after a thorough discussion of the procedural risks, benefits and alternatives. Specific risks discussed include: Bleeding, infection, contrast reaction, kidney injury/failure, need for further procedure/surgery, arterial injury or dissection, embolization to new territory, intracranial hemorrhage (10-15% risk), neurologic deterioration, cardiopulmonary collapse, death. All questions were addressed. Maximal Sterile Barrier Technique was utilized including during the procedure including caps, mask, sterile gowns, sterile gloves, sterile drape, hand hygiene and skin antiseptic. A timeout was performed prior to the initiation of  the procedure. The anesthesia team was present to provide general endotracheal tube anesthesia and for patient monitoring during the procedure. Interventional neuro radiology nursing staff was also present. FINDINGS:  Initial Findings: Left common carotid artery: Intimal irregularity at the origin of the left common carotid artery corresponds to the patient's postsurgical changes. No high-grade stenosis or intimal filling defect. Left external carotid artery: Patent with antegrade flow. Left internal carotid artery: Normal course caliber and contour of the cervical portion. Vertical and petrous segment patent with normal course caliber contour. Cavernous segment patent. Clinoid segment patent. Antegrade flow of the ophthalmic artery. Ophthalmic segment patent. Terminus patent. Left MCA: M1 segment patent. Insular and opercular segments patent. Unremarkable caliber and course of the cortical segments. Typical arterial, capillary/ parenchymal, and venous phase. Left ACA: A1 segment patent with adequate cross flow through patent anterior communicating artery. Left A2 occluded Completion Findings: Non flow limiting dissection at the distal cervical ICA, in the left ICA. After 4 passes of left a 2 segment there was restoration of TICU 2b flow, although we then withdrew to the cervical segment 4 a final angiogram. Final image demonstrates no flow into the left A2 segment, TICI 0. Flat panel CT demonstrates no complicating features. PROCEDURE: Patient is brought emergently to the neuro angiography suite, with the patient identified appropriately and placed supine position on the table. Left radial arterial line was placed by the anesthesia team. The patient is then prepped and draped in the usual sterile fashion. Ultrasound survey of the right inguinal region was performed with images stored and sent to PACs. 11 blade scalpel was used to make a small incision. Blunt dissection was performed. A micropuncture needle was used access the right common femoral artery under ultrasound. With excellent arterial blood flow returned, an .018 micro wire was passed through the needle, observed to enter the abdominal aorta under fluoroscopy. The  needle was removed, and a micropuncture sheath was placed over the wire. The inner dilator and wire were removed, and an 035 Bentson wire was advanced under fluoroscopy into the abdominal aorta. The sheath was removed and a standard 5 [place] vascular sheath was placed. The dilator was removed and the sheath was flushed. A 42F JB-1 diagnostic catheter was advanced over the wire to the proximal descending thoracic aorta. Wire was then removed. Double flush of the catheter was performed. Catheter was then used to select the left common carotid artery. The catheter wire combination were used to navigate to the distal cervical segment. Angiogram was performed. After the angiogram was performed, a non flow limiting dissection was identified in the distal cervical segment at a point of inflection. We observed this for an interval of approximately 2 minutes, I spoke with neurologist on-call, and then we proceeded with treatment. Formal angiogram was performed, with roadmap achieved. Exchange length Rosen wire was then passed through the diagnostic catheter to the distal cervical ICA and the diagnostic catheter was removed. The 5 French sheath was removed and exchanged for 8 French 55 centimeter BrightTip sheath. Sheath was flushed and attached to pressurized and heparinized saline bag for constant forward flow. Eight French 95 cm Walrus balloon catheter was then advanced over the wire to the distal cervical segment. Wire was removed. Then a coaxial intermediate catheter and microcatheter combination was prepared on the back table. This combination was Penumbra Jet-D catheter and a Velcity microcatheter, with a synchro soft wire. This combination was then advanced through the balloon guide into the ICA. System was advanced through the  internal carotid artery, to the supraclinoid segment. The micro wire was then carefully advanced into the A1 segment. Occluded left a 2 segment without prolapsing into the left MCA. The  microwire microcatheter were then navigated through the patent anterior communicating artery for further wire purchase. Once the wire and microcatheter within a proximal M2 right-sided branch, the jet D catheter was advanced to the a 1 segment. With the intermediate catheter in the A1 segment, the microcatheter and microwire were withdrawn. Recognizing that we would not be able to pass the jet D catheter as a primary aspiration device, we elected to proceed with a penumbra 3 max aspiration catheter and the synchro soft wire. Synchro soft wire and the 3 max catheter were passed into the A2 segment proximally. Wire was removed and then aspiration was performed. A total of 3 primary aspiration (ADAPT) passes were performed with the jet D catheter in the A1 segment and using the 3 max catheter. Three attempts with primary aspiration was successful in aspirating thrombus, with partial flow restored. Final attempt was performed with mechanical thrombectomy. Microwire and microcatheter were placed into the A2 segment, with a combination of the synchro soft wire a trevo proview 18 catheter. A 4 x 40 solitaire device was then deployed through the microcatheter with 3 minute interval observed. The retriever device was then withdrawn into the intermediate catheter, as we did not want to withdraw the whole system below the site of dissection into the balloon guide. Final angiogram confirmed partial restoration of flow. After this fourth pass was performed we decided to withdraw from any further attempts. The intermediate catheter was then withdrawn into the balloon guide for a final angiogram. Final angiogram demonstrated improved appearance of the dissection site, however, there was now absent flow through the A2 segment on the left. All sheaths wires catheters were removed, and Angio-Seal was deployed for hemostasis. Patient tolerated the procedure well and remained hemodynamically stable throughout. No complications were  encountered. Estimated blood loss approximately 50 cc. IMPRESSION: Status post ultrasound guided access right common femoral artery for left-sided cervical and cerebral angiogram and attempted thrombectomy of left A2 occlusion, with 3 passes primary aspiration and a single pass of mechanical thrombectomy yielding no significant improvement in flow through the A2 segment. Non flow limiting dissection was identified in the distal cervical segment. Signed, Yvone Neu. Reyne Dumas, [facility] Radiology PLAN: To the ICU for management, including ventilation management Patient remained intubated Blood pressure goal of 160 systolic for the first 24 hours Antiplatelets are indicated Right hip straight time 6 hours Electronically Signed   By: Gilmer Mor D.O.   On: 05/10/2019 01:12   CT Code Stroke Cerebral Perfusion with contrast  Result Date: 05/08/2019 CLINICAL DATA:  Stroke, follow-up. EXAM: CT PERFUSION BRAIN TECHNIQUE: Multiphase CT imaging of the brain was performed following IV bolus contrast injection. Subsequent parametric perfusion maps were calculated using RAPID software. CONTRAST:  OMNIPAQUE IOHEXOL 350 MG/ML SOLN, 50mL OMNIPAQUE IOHEXOL 350 MG/ML SOLN COMPARISON:  Same day noncontrast head CT and CT angiogram head/neck FINDINGS: CT Brain Perfusion Findings: CBF (<30%) Volume: 0mL Perfusion (Tmax>6.0s) volume: 125 mL (predominantly involving the left ACA and ACA/MCA watershed territory, as well as the right ACA/MCA watershed territory). Mismatch Volume: 125 mL Infarct Core: None identified by the perfusion software. Infarction Location: None identified by the perfusion software. These results were called by telephone at the time of interpretation on 05/08/2019 at 11:15 a.m. pm to provider ASHISH ARORA ,  who verbally acknowledged these results. IMPRESSION: 125 mL region of hypoperfused parenchyma predominantly within the left ACA and ACA/MCA watershed  territory, as well as right ACA/MCA watershed territory. The perfusion software does not detect any core infarct. However, there were clear changes of infarction within the corpus callosum and left pericallosal gyrus on noncontrast head CT performed earlier the same day. Electronically Signed   By: Jackey Loge DO   On: 05/08/2019 12:43   DG CHEST PORT 1 VIEW  Result Date: 05/12/2019 CLINICAL DATA:  Aspiration into airway. EXAM: PORTABLE CHEST 1 VIEW COMPARISON:  Chest radiograph 05/09/2019 FINDINGS: Unchanged cardiomegaly. Aortic atherosclerosis. Redemonstrated left chest dual lead pacer. Prior median sternotomy with cardiac valve prosthesis. Shallow inspiration radiograph. Similar appearance of interstitial and ill-defined opacity throughout the left lung. No sizable pleural effusion or evidence of pneumothorax. No acute bony abnormality. IMPRESSION: Shallow inspiration radiograph. Similar appearance of interstitial and ill-defined opacities throughout the left lung. Findings are suspicious for pneumonia given provided history. Asymmetric edema is also a consideration. Electronically Signed   By: Jackey Loge DO   On: 05/12/2019 10:59   DG CHEST PORT 1 VIEW  Result Date: 05/09/2019 CLINICAL DATA:  Endotracheal tube present. EXAM: PORTABLE CHEST 1 VIEW COMPARISON:  May 08, 2019. FINDINGS: Stable cardiomegaly. Endotracheal and nasogastric tubes are unchanged in position. Left-sided pacemaker is unchanged. Status post cardiac valve repair. No pneumothorax or significant pleural effusion is noted. Right lung is clear. Stable diffuse reticular densities are noted in the left lung concerning for inflammation, atelectasis or possibly edema. Bony thorax is unremarkable. IMPRESSION: Stable support apparatus. Stable diffuse reticular densities are noted in left lung concerning for inflammation, atelectasis or possibly edema. Electronically Signed   By: Lupita Raider M.D.   On: 05/09/2019 08:26   DG Chest Port 1  View  Result Date: 05/08/2019 CLINICAL DATA:  54 yo male with acute onset of right sided symptoms this morning, presents to the ED from home. DC yesterday from [facility]. EXAM: PORTABLE CHEST 1 VIEW COMPARISON:  Chest radiograph 05/02/2019 FINDINGS: Stable cardiomediastinal contours with enlarged heart size. Interval intubation with endotracheal tube tip terminating between the thoracic inlet and carina. Nasogastric tube courses below the diaphragm with tip out of field of view. Left chest ICD in place. There are diffuse interstitial opacities throughout the left lung which are improved compared to prior radiograph. The right remains well aerated. No pneumothorax or large pleural effusion. IMPRESSION: 1. Interval intubation with endotracheal tube tip terminating between the thoracic inlet and carina. Nasogastric tube courses below the diaphragm with tip out of field of view. 2. Diffuse interstitial opacities throughout the left lung, improved compared to prior radiograph. Electronically Signed   By: Emmaline Kluver M.D.   On: 05/08/2019 17:33   DG Swallowing Func-Speech Pathology  Result Date: 05/10/2019 Objective Swallowing Evaluation: Type of Study: MBS-Modified Barium Swallow Study  Patient Details Name: Jon [name] MRN: [id number] Date of Birth: January 23, 1966 Today's Date: 05/10/2019 Time: SLP Start Time (ACUTE ONLY): 1245 -SLP Stop Time (ACUTE ONLY): 1315 SLP Time Calculation (min) (ACUTE ONLY): 30 min Past Medical History: Past Medical History: Diagnosis Date . Aneurysm of thoracic aorta (HCC) 05/06/2012  IMO routine update IMO routine update . Anxiety   sees Dr. Renae Fickle . Aortic aneurysm and dissection (HCC) 05/06/2012 . Aortic stenosis   mechanical AVR 04/22/12 . Aortic valve disorder 05/06/2012 . 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 Bicuspid aortic valve  . Cerebral infarction (HCC)  05/11/2012 . Cerebral thrombosis with cerebral infarction (HCC) 07/14/2013 .  Cholelithiasis 12/20/2016 . Current use of long term anticoagulation 12/20/2016 . CVA (cerebral infarction)   occipital CVA 12/13 . Dissecting aortic aneurysm (HCC) 05/06/2012 . Dysarthria 09/10/2017 . Dysrhythmia   sees Dr. Geronimo Running, [facility] cardiology in [place] . Generalized constriction of visual field 10/15/2012  IMOUPDATE IMOUPDATE . 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 GERD (gastroesophageal reflux disease)  . HCAP (healthcare-associated pneumonia) 05/06/2012 . Headache(784.0)   "due to vision problem" . History of CVA (cerebrovascular accident) 05/07/2012 . Hypothyroidism  . Idiopathic peripheral neuropathy 07/14/2013 . Late effects of CVA (cerebrovascular accident) 08/08/2015 . 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 Paroxysmal atrial fibrillation (HCC) 08/08/2015  On warfarin On warfarin . Presence of prosthetic heart valve 08/08/2015 . Shortness of breath  . Stroke (HCC)   x3 . Stroke, embolic (HCC) 05/07/2012 . Syncope 05/06/2012 . Thoracic aortic aneurysm (HCC)   replacement aortic root 04/22/12 . VBI (vertebrobasilar insufficiency) 09/10/2017 Past Surgical History: Past Surgical History: Procedure Laterality Date . APPLICATION OF WOUND VAC  06/11/2012  Procedure: APPLICATION OF WOUND VAC;  Surgeon: Jon Slot, MD;  Location: [name] OR;  Service: Vascular;  Laterality: Right; . CARDIAC CATHETERIZATION   . CHOLECYSTECTOMY   . CORONARY ARTERY BYPASS GRAFT  04/22/2012  Procedure: CORONARY ARTERY BYPASS GRAFTING (CABG);  Surgeon: Jon Slot, MD;  Location: [facility] OR;  Service: Open Heart Surgery;  Laterality: N/A;  times one with right greater saphenous vein graft . I & D EXTREMITY  06/11/2012  Procedure: IRRIGATION AND DEBRIDEMENT EXTREMITY;  Surgeon: Jon Slot, MD;  Location: [name] OR;  Service: Vascular;  Laterality: Right; . INTRAOPERATIVE TRANSESOPHAGEAL ECHOCARDIOGRAM  04/22/2012  Procedure: INTRAOPERATIVE TRANSESOPHAGEAL ECHOCARDIOGRAM;  Surgeon:  Jon Slot, MD;  Location: [facility] OR;  Service: Open Heart Surgery;  Laterality: N/A; . IR CT HEAD LTD  05/08/2019 . IR PERCUTANEOUS ART THROMBECTOMY/INFUSION INTRACRANIAL INC DIAG ANGIO  05/08/2019 . IR US GUIDE VASC ACCESS RIGHT  05/08/2019 . ROTATOR CUFF REPAIR    12 years ago, Left . THORACIC AORTIC ANEURYSM REPAIR  04/22/2012  Procedure: THORACIC ASCENDING ANEURYSM REPAIR (AAA);  Surgeon: Jon Slot, MD;  Location: [facility] OR;  Service: Open Heart Surgery;  Laterality: N/A; HPI: 54 yo admitted with right weakness and left gaze with aphasia with acute left ACA anterior territory CVA s/p revascularization. VDRF 1/1-1/2. PMhx: dysarthria, aortic dissection s/p repair, AVR, GERD, CVA x 3. Prior CVA resulted in verbosity, cognitive impairment including impulsivity, mild dysphagia. In 2014 MBS recommended regualr thin with a chin tuck.  No data recorded Assessment / Plan / Recommendation CHL IP CLINICAL IMPRESSIONS 05/10/2019 Clinical Impression Pt demonstrates a primary oral dysphagia with resulting pharyngeal deficits related to timing, but not strength. Pt has right labial and lingual weakness, and on fluro there is obviously decreased lingual coordination and velopharyngeal elevation and coordination. Due to weakness there is anterior spillage, particularly at the moment of swallow initiation due to escape of pressure through the oral cavity. Pt requries intermittent explicit verbal cues for how to move his tongue to initiate oral transit which aide in more reflexive lingual pumping. Pumping does result in piecemeal, uncontrolled spillage with multiple reflexive swallow per bolus. Thin and nectar thick liquids spill and are aspirated before the swallow. Honey thick liquids as well even had come frank penetration with immediate ejection with a throat clear. A chin tuck helps increase time for swallow initaition and pt is familiar with this strategy from prior  CVA. Study was quite limited as pt seemed very  overwhelmed and uncomfortable though he did participate. Recommend pt initiate a puree dys 1 diet with honey thick liquids with a chin tuck and spoon feeding preferred at this time.  SLP Visit Diagnosis Dysphagia, oropharyngeal phase (R13.12) Attention and concentration deficit following -- Frontal lobe and executive function deficit following -- Impact on safety and function Moderate aspiration risk   CHL IP TREATMENT RECOMMENDATION 05/10/2019 Treatment Recommendations F/U MBS in --- days (Comment)   Prognosis 05/08/2012 Prognosis for Safe Diet Advancement Good Barriers to Reach Goals Cognitive deficits Barriers/Prognosis Comment -- CHL IP DIET RECOMMENDATION 05/10/2019 SLP Diet Recommendations Dysphagia 1 (Puree) solids;Honey thick liquids Liquid Administration via -- Medication Administration -- Compensations Slow rate;Small sips/bites;Chin tuck Postural Changes Remain semi-upright after after feeds/meals (Comment)   CHL IP OTHER RECOMMENDATIONS 05/07/2012 Recommended Consults -- Oral Care Recommendations -- Other Recommendations Order thickener from pharmacy   CHL IP FOLLOW UP RECOMMENDATIONS 05/10/2019 Follow up Recommendations Inpatient Rehab   CHL IP FREQUENCY AND DURATION 05/10/2019 Speech Therapy Frequency (ACUTE ONLY) min 2x/week Treatment Duration --      CHL IP ORAL PHASE 05/10/2019 Oral Phase Impaired Oral - Pudding Teaspoon -- Oral - Pudding Cup -- Oral - Honey Teaspoon Decreased bolus cohesion;Delayed oral transit;Premature spillage;Lingual/palatal residue;Decreased velopharyngeal closure;Reduced posterior propulsion;Lingual pumping;Weak lingual manipulation Oral - Honey Cup -- Oral - Nectar Teaspoon Decreased bolus cohesion;Delayed oral transit;Premature spillage;Lingual/palatal residue;Decreased velopharyngeal closure;Reduced posterior propulsion;Lingual pumping;Weak lingual manipulation Oral - Nectar Cup -- Oral - Nectar Straw -- Oral - Thin Teaspoon -- Oral - Thin Cup Decreased bolus cohesion;Delayed oral  transit;Premature spillage;Lingual/palatal residue;Decreased velopharyngeal closure;Reduced posterior propulsion;Lingual pumping;Weak lingual manipulation Oral - Thin Straw -- Oral - Puree Decreased bolus cohesion;Delayed oral transit;Premature spillage;Lingual/palatal residue;Decreased velopharyngeal closure;Reduced posterior propulsion;Lingual pumping;Weak lingual manipulation Oral - Mech Soft -- Oral - Regular -- Oral - Multi-Consistency -- Oral - Pill -- Oral Phase - Comment --  CHL IP PHARYNGEAL PHASE 05/10/2019 Pharyngeal Phase Impaired Pharyngeal- Pudding Teaspoon -- Pharyngeal -- Pharyngeal- Pudding Cup -- Pharyngeal -- Pharyngeal- Honey Teaspoon Delayed swallow initiation-vallecula;Penetration/Aspiration before swallow Pharyngeal Material enters airway, passes BELOW cords then ejected out;Material does not enter airway Pharyngeal- Honey Cup -- Pharyngeal -- Pharyngeal- Nectar Teaspoon Delayed swallow initiation-pyriform sinuses;Penetration/Aspiration before swallow Pharyngeal Material enters airway, passes BELOW cords and not ejected out despite cough attempt by patient Pharyngeal- Nectar Cup -- Pharyngeal -- Pharyngeal- Nectar Straw -- Pharyngeal -- Pharyngeal- Thin Teaspoon Delayed swallow initiation-pyriform sinuses;Penetration/Aspiration before swallow Pharyngeal Material enters airway, passes BELOW cords and not ejected out despite cough attempt by patient Pharyngeal- Thin Cup -- Pharyngeal -- Pharyngeal- Thin Straw -- Pharyngeal -- Pharyngeal- Puree Delayed swallow initiation-vallecula Pharyngeal -- Pharyngeal- Mechanical Soft -- Pharyngeal -- Pharyngeal- Regular -- Pharyngeal -- Pharyngeal- Multi-consistency -- Pharyngeal -- Pharyngeal- Pill -- Pharyngeal -- Pharyngeal Comment --  No flowsheet data found. DeBlois, [name] Nearing 05/10/2019, 2:01 PM              ECHOCARDIOGRAM COMPLETE  Result Date: 05/09/2019   ECHOCARDIOGRAM REPORT   Patient Name:   Jon [name] Date of Exam: 05/09/2019 Medical  Rec #:  [id number]           Height:       71.0 in Accession #:    [id number]          Weight:       233.7 lb Date of Birth:  Nov 04, 1965           BSA:  2.25 m Patient Age:    53 years            BP:           95/82 mmHg Patient Gender: M                   HR:           63 bpm. Exam Location:  Inpatient Procedure: 2D Echo Indications:    stroke 434.91  History:        Patient has prior history of Echocardiogram examinations, most                 recent 05/01/2018. Aortic dissection; Risk Factors:Current                 Smoker. Aortic Valve: A mechanical aortic valve prosthesis valve                 is present in the aortic position.  Sonographer:    Delcie Roch Referring Phys: [id number] ASHISH ARORA IMPRESSIONS  1. Left ventricular ejection fraction, by visual estimation, is 25 to 30%. The left ventricle has severely decreased function. There is no left ventricular hypertrophy.  2. Elevated left ventricular end-diastolic pressure.  3. Left ventricular diastolic parameters are consistent with Grade II diastolic dysfunction (pseudonormalization).  4. The left ventricle demonstrates global hypokinesis.  5. Global right ventricle has normal systolic function.The right ventricular size is normal. No increase in right ventricular wall thickness.  6. Left atrial size was normal.  7. Right atrial size was normal.  8. The mitral valve is normal in structure. Mild mitral valve regurgitation. No evidence of mitral stenosis.  9. The tricuspid valve is normal in structure. 10. Aortic valve regurgitation is not visualized. Mild aortic valve stenosis. 11. Mechanical prosthesis in the aortic valve position. 12. The pulmonic valve was normal in structure. Pulmonic valve regurgitation is not visualized. 13. Normal pulmonary artery systolic pressure. 14. The inferior vena cava is normal in size with greater than 50% respiratory variability, suggesting right atrial pressure of 3 mmHg. 15. When compared to the prior study  from 05/01/2018 LVEF has decreased from 55-60% to 25-30% with diffuse hypokinesis. Transaortic gradients across the mechanical aortic valve are unchanged. FINDINGS  Left Ventricle: Left ventricular ejection fraction, by visual estimation, is 25 to 30%. The left ventricle has severely decreased function. The left ventricle demonstrates global hypokinesis. There is no left ventricular hypertrophy. Left ventricular diastolic parameters are consistent with Grade II diastolic dysfunction (pseudonormalization). Elevated left ventricular end-diastolic pressure. Right Ventricle: The right ventricular size is normal. No increase in right ventricular wall thickness. Global RV systolic function is has normal systolic function. The tricuspid regurgitant velocity is 2.36 m/s, and with an assumed right atrial pressure  of 3 mmHg, the estimated right ventricular systolic pressure is normal at 25.3 mmHg. Left Atrium: Left atrial size was normal in size. Right Atrium: Right atrial size was normal in size Pericardium: There is no evidence of pericardial effusion. Mitral Valve: The mitral valve is normal in structure. Mild mitral valve regurgitation. No evidence of mitral valve stenosis by observation. Tricuspid Valve: The tricuspid valve is normal in structure. Tricuspid valve regurgitation is mild. Aortic Valve: The aortic valve has been repaired/replaced. Aortic valve regurgitation is not visualized. Mild aortic stenosis is present. Aortic valve mean gradient measures 18.5 mmHg. Aortic valve peak gradient measures 34.5 mmHg. Aortic valve area, by VTI measures 1.81 cm. Mechanical aortic valve prosthesis valve is present in  the aortic position. Pulmonic Valve: The pulmonic valve was normal in structure. Pulmonic valve regurgitation is not visualized. Pulmonic regurgitation is not visualized. Aorta: The aortic root, ascending aorta and aortic arch are all structurally normal, with no evidence of dilitation or obstruction. Venous: The  inferior vena cava is normal in size with greater than 50% respiratory variability, suggesting right atrial pressure of 3 mmHg. IAS/Shunts: No atrial level shunt detected by color flow Doppler. There is no evidence of a patent foramen ovale. No ventricular septal defect is seen or detected. There is no evidence of an atrial septal defect.  LEFT VENTRICLE PLAX 2D LVIDd:         6.80 cm  Diastology LVIDs:         4.90 cm  LV e' lateral:   6.74 cm/s LV PW:         1.50 cm  LV E/e' lateral: 9.8 LV IVS:        1.10 cm  LV e' medial:    6.20 cm/s LVOT diam:     2.30 cm  LV E/e' medial:  10.7 LV SV:         126 ml LV SV Index:   54.12 LVOT Area:     4.15 cm  RIGHT VENTRICLE RV S prime:     10.10 cm/s TAPSE (M-mode): 1.3 cm LEFT ATRIUM              Index       RIGHT ATRIUM           Index LA diam:        4.90 cm  2.18 cm/m  RA Area:     19.10 cm LA Vol (A2C):   104.0 ml 46.17 ml/m RA Volume:   50.30 ml  22.33 ml/m LA Vol (A4C):   88.6 ml  39.33 ml/m LA Biplane Vol: 97.9 ml  43.46 ml/m  AORTIC VALVE AV Area (Vmax):    1.86 cm AV Area (Vmean):   1.69 cm AV Area (VTI):     1.81 cm AV Vmax:           293.50 cm/s AV Vmean:          196.000 cm/s AV VTI:            0.535 m AV Peak Grad:      34.5 mmHg AV Mean Grad:      18.5 mmHg LVOT Vmax:         131.50 cm/s LVOT Vmean:        79.750 cm/s LVOT VTI:          0.233 m LVOT/AV VTI ratio: 0.44  AORTA Ao Root diam: 3.80 cm MITRAL VALVE                        TRICUSPID VALVE MV Area (PHT): 3.48 cm             TR Peak grad:   22.3 mmHg MV PHT:        63.22 msec           TR Vmax:        236.00 cm/s MV Decel Time: 218 msec MV E velocity: 66.10 cm/s 103 cm/s  SHUNTS MV A velocity: 47.10 cm/s 70.3 cm/s Systemic VTI:  0.23 m MV E/A ratio:  1.40       1.5       Systemic Diam: 2.30 cm  Tobias Alexander MD Electronically signed by  Tobias Alexander MD Signature Date/Time: 05/09/2019/1:28:37 PM    Final    CT Angio Chest/Abd/Pel for Dissection W and/or W/WO  Result Date:  05/08/2019 CLINICAL DATA:  Possible dissection. EXAM: CT ANGIOGRAPHY CHEST, ABDOMEN AND PELVIS TECHNIQUE: Multidetector CT imaging through the chest, abdomen and pelvis was performed using the standard protocol during bolus administration of intravenous contrast. Multiplanar reconstructed images and MIPs were obtained and reviewed to evaluate the vascular anatomy. CONTRAST:  50mL OMNIPAQUE IOHEXOL 350 MG/ML SOLN COMPARISON:  July 08, 2012.  April 30, 2019. FINDINGS: CTA CHEST FINDINGS Cardiovascular: Status post surgical repair of ascending thoracic aortic aneurysm as well as aortic valve replacement. No aneurysm or dissection is noted currently. Left common carotid and subclavian arteries appear to be patent. 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 Mild cardiomegaly is noted. No pericardial effusion is noted. Left-sided pacemaker is unchanged in position. Mediastinum/Nodes: Thyroid gland and esophagus are unremarkable. Stable aortopulmonary window and right paratracheal adenopathy is noted which most likely is reactive and benign. Lungs/Pleura: No pneumothorax or pleural effusion is noted. Diffuse left lung opacity is noted concerning for pneumonia or scarring. Mild emphysematous disease is noted. Mild right posterior basilar subsegmental atelectasis is noted. Musculoskeletal: No chest wall abnormality. No acute or significant osseous findings. Review of the MIP images confirms the above findings. CTA ABDOMEN AND PELVIS FINDINGS VASCULAR Aorta: There is no evidence of aortic dissection or aneurysm. Mild atheromatous disease is noted with probable focal soft plaque seen involving the infrarenal abdominal aorta. Celiac: Patent without evidence of aneurysm, dissection, vasculitis or significant stenosis. SMA: Patent without evidence of aneurysm, dissection, vasculitis or significant stenosis. Renals:  Both renal arteries are patent without evidence of aneurysm, dissection, vasculitis, fibromuscular dysplasia or significant stenosis. IMA: Patent without evidence of aneurysm, dissection, vasculitis or significant stenosis. Inflow: Patent without evidence of aneurysm, dissection, vasculitis or significant stenosis. Veins: No obvious venous abnormality within the limitations of this arterial phase study. Review of the MIP images confirms the above findings. NON-VASCULAR Hepatobiliary: No focal liver abnormality is seen. Status post cholecystectomy. No biliary dilatation. Pancreas: Unremarkable. No pancreatic ductal dilatation or surrounding inflammatory changes. Spleen: Normal in size without focal abnormality. Adrenals/Urinary Tract: Adrenal glands are unremarkable. Kidneys are normal, without renal calculi, focal lesion, or hydronephrosis. Bladder is unremarkable. Stomach/Bowel: Stomach is within normal limits. Appendix appears normal. No evidence of bowel wall thickening, distention, or inflammatory changes. Lymphatic: No significant adenopathy is noted. Reproductive: Prostate is unremarkable. Other: Bilateral fat containing inguinal hernias are noted, right greater than left. No ascites is noted. Musculoskeletal: No acute or significant osseous findings. Review of the MIP images confirms the above findings. IMPRESSION: Status post surgical repair of ascending thoracic aortic aneurysm as well as aortic valve repair. No definite evidence of acute dissection or aneurysm is seen involving the thoracic or abdominal aorta. 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 Stable diffuse lung opacity is noted concerning for pneumonia or scarring. Bilateral fat containing inguinal hernias are noted, right greater than left. Aortic Atherosclerosis (ICD10-I70.0) and Emphysema (ICD10-J43.9).1 Electronically Signed   By: Lupita Raider M.D.   On: 05/08/2019 12:50    IR PERCUTANEOUS ART THROMBECTOMY/INFUSION INTRACRANIAL INC DIAG ANGIO  Result Date: 05/10/2019 INDICATION: 54 year old male presents with acute altered mental status, symptoms of new right lower extremity weakness,  NIH stroke scale greater than 8. Imaging workup demonstrates a new occlusion the innominate artery and left A2 occlusion. 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 EXAM: ULTRASOUND GUIDED ACCESS RIGHT COMMON FEMORAL ARTERY CERVICAL AND CEREBRAL ANGIOGRAM MECHANICAL THROMBECTOMY OF LEFT ACA DEPLOYMENT OF ANGIO-SEAL FOR HEMOSTASIS COMPARISON:  CT IMAGING SAME DAY MEDICATIONS: 1 g vancomycin. The antibiotic was administered within 1 hour of the procedure ANESTHESIA/SEDATION: The anesthesia team was present to provide general endotracheal tube anesthesia and for patient monitoring during the procedure. Interventional neuro radiology nursing staff was also present. CONTRAST:  85 cc IV contrast FLUOROSCOPY TIME:  Fluoroscopy Time: 47 minutes 12 seconds (3,084 mGy). COMPLICATIONS: SIR LEVEL B - Normal therapy, includes overnight admission for observation. TECHNIQUE: Informed written consent was obtained from the patient's family after a thorough discussion of the procedural risks, benefits and alternatives. Specific risks discussed include: Bleeding, infection, contrast reaction, kidney injury/failure, need for further procedure/surgery, arterial injury or dissection, embolization to new territory, intracranial hemorrhage (10-15% risk), neurologic deterioration, cardiopulmonary collapse, death. All questions were addressed. Maximal Sterile Barrier Technique was utilized including during the procedure including caps, mask, sterile gowns, sterile gloves, sterile drape, hand hygiene and skin antiseptic. A timeout was performed prior to the initiation of the procedure. The anesthesia team was present to provide general endotracheal tube anesthesia and for patient monitoring during the procedure.  Interventional neuro radiology nursing staff was also present. FINDINGS: Initial Findings: Left common carotid artery: Intimal irregularity at the origin of the left common carotid artery corresponds to the patient's postsurgical changes. No high-grade stenosis or intimal filling defect. Left external carotid artery: Patent with antegrade flow. Left internal carotid artery: Normal course caliber and contour of the cervical portion. Vertical and petrous segment patent with normal course caliber contour. Cavernous segment patent. Clinoid segment patent. Antegrade flow of the ophthalmic artery. Ophthalmic segment patent. Terminus patent. Left MCA: M1 segment patent. Insular and opercular segments patent. Unremarkable caliber and course of the cortical segments. Typical arterial, capillary/ parenchymal, and venous phase. Left ACA: A1 segment patent with adequate cross flow through patent anterior communicating artery. Left A2 occluded Completion Findings: Non flow limiting dissection at the distal cervical ICA, in the left ICA. After 4 passes of left a 2 segment there was restoration of TICU 2b flow, although we then withdrew to the cervical segment 4 a final angiogram. Final image demonstrates no flow into the left A2 segment, TICI 0. Flat panel CT demonstrates no complicating features. PROCEDURE: Patient is brought emergently to the neuro angiography suite, with the patient identified appropriately and placed supine position on the table. Left radial arterial line was placed by the anesthesia team. The patient is then prepped and draped in the usual sterile fashion. Ultrasound survey of the right inguinal region was performed with images stored and sent to PACs. 11 blade scalpel was used to make a small incision. Blunt dissection was performed. A micropuncture needle was used access the right common femoral artery under ultrasound. With excellent arterial blood flow returned, an .018 micro wire was passed through the  needle, observed to enter the abdominal aorta under fluoroscopy. The needle was removed, and a micropuncture sheath was placed over the wire. The inner dilator and wire were removed, and an 035 Bentson wire was advanced under fluoroscopy into the abdominal aorta. The sheath was removed and a standard 5 [place] vascular sheath was placed. The dilator was removed and the sheath was flushed. A 67F JB-1 diagnostic catheter was advanced over the wire  to the proximal descending thoracic aorta. Wire was then removed. Double flush of the catheter was performed. Catheter was then used to select the left common carotid artery. The catheter wire combination were used to navigate to the distal cervical segment. Angiogram was performed. After the angiogram was performed, a non flow limiting dissection was identified in the distal cervical segment at a point of inflection. We observed this for an interval of approximately 2 minutes, I spoke with neurologist on-call, and then we proceeded with treatment. Formal angiogram was performed, with roadmap achieved. Exchange length Rosen wire was then passed through the diagnostic catheter to the distal cervical ICA and the diagnostic catheter was removed. The 5 French sheath was removed and exchanged for 8 French 55 centimeter BrightTip sheath. Sheath was flushed and attached to pressurized and heparinized saline bag for constant forward flow. Eight French 95 cm Walrus balloon catheter was then advanced over the wire to the distal cervical segment. Wire was removed. Then a coaxial intermediate catheter and microcatheter combination was prepared on the back table. This combination was Penumbra Jet-D catheter and a Velcity microcatheter, with a synchro soft wire. This combination was then advanced through the balloon guide into the ICA. System was advanced through the internal carotid artery, to the supraclinoid segment. The micro wire was then carefully advanced into the A1 segment.  Occluded left a 2 segment without prolapsing into the left MCA. The microwire microcatheter were then navigated through the patent anterior communicating artery for further wire purchase. Once the wire and microcatheter within a proximal M2 right-sided branch, the jet D catheter was advanced to the a 1 segment. With the intermediate catheter in the A1 segment, the microcatheter and microwire were withdrawn. Recognizing that we would not be able to pass the jet D catheter as a primary aspiration device, we elected to proceed with a penumbra 3 max aspiration catheter and the synchro soft wire. Synchro soft wire and the 3 max catheter were passed into the A2 segment proximally. Wire was removed and then aspiration was performed. A total of 3 primary aspiration (ADAPT) passes were performed with the jet D catheter in the A1 segment and using the 3 max catheter. Three attempts with primary aspiration was successful in aspirating thrombus, with partial flow restored. Final attempt was performed with mechanical thrombectomy. Microwire and microcatheter were placed into the A2 segment, with a combination of the synchro soft wire a trevo proview 18 catheter. A 4 x 40 solitaire device was then deployed through the microcatheter with 3 minute interval observed. The retriever device was then withdrawn into the intermediate catheter, as we did not want to withdraw the whole system below the site of dissection into the balloon guide. Final angiogram confirmed partial restoration of flow. After this fourth pass was performed we decided to withdraw from any further attempts. The intermediate catheter was then withdrawn into the balloon guide for a final angiogram. Final angiogram demonstrated improved appearance of the dissection site, however, there was now absent flow through the A2 segment on the left. All sheaths wires catheters were removed, and Angio-Seal was deployed for hemostasis. Patient tolerated the procedure well and  remained hemodynamically stable throughout. No complications were encountered. Estimated blood loss approximately 50 cc. IMPRESSION: Status post ultrasound guided access right common femoral artery for left-sided cervical and cerebral angiogram and attempted thrombectomy of left A2 occlusion, with 3 passes primary aspiration and a single pass of mechanical thrombectomy yielding no significant improvement in flow through the A2  segment. Non flow limiting dissection was identified in the distal cervical segment. Signed, Yvone Neu. Reyne Dumas, RPVI [name] PLAN: To the ICU for management, including ventilation management Patient remained intubated Blood pressure goal of 160 systolic for the first 24 hours Antiplatelets are indicated Right hip straight time 6 hours Electronically Signed   By: Gilmer Mor D.O.   On: 05/10/2019 01:12   CT HEAD CODE STROKE WO CONTRAST  Result Date: 05/08/2019 CLINICAL DATA:  Code stroke. Neuro deficit, acute, stroke suspected. EXAM: CT HEAD WITHOUT CONTRAST TECHNIQUE: Contiguous axial images were obtained from the base of the skull through the vertex without intravenous contrast. COMPARISON:  Head CT 10/26/2018 FINDINGS: Brain: Abnormal hypodensity within the body of the corpus callosum eccentric to the left also possibly involving portions left pericallosal gyrus, measuring 2.1 x 1.1 cm in transaxial dimensions (series 3, image 19) (series 6, image 35). No evidence of acute intracranial hemorrhage. No demarcated cortical infarction. No evidence of intracranial mass. No midline shift or extra-axial fluid collection. Cerebral volume is normal for age. Vascular: No hyperdense vessel. Skull: Normal. Negative for fracture or focal lesion. Sinuses/Orbits: Visualized orbits demonstrate no acute abnormality. These results were called by telephone at the time of interpretation on 05/08/2019 at 11:01 am to provider Dr. Wilford Corner, who  verbally acknowledged these results. IMPRESSION: 2.1 x 1.1 cm focus of abnormal hypodensity within the left aspect of the callosal body and possibly also involving the pericallosal gyrus on the left. Findings are new from prior examination 10/26/2018 and may reflect an acute infarct or other lesion. Consider brain MRI for further evaluation. Electronically Signed   By: Jackey Loge DO   On: 05/08/2019 11:01       HISTORY OF PRESENT ILLNESS Jon [name] is an 54 y.o. male  with past medical history of a prior stroke with residual dysarthria, type aortic dissection in 2014 with repair that was complicated and involved a brachiocephalic graft to the left carotid as the dissection extended into the brachiocephalic and left carotid, carotid, aortic stenosis status post mechanical valve, presented to the ED as a code stroke for sudden onset of leftward gaze and right-sided flaccid paralysis. It was very difficult to reach his family-number listed for the mother rang busy multiple times.  Finally was able to reach the mother to get history.  Per EMS and mother Jon [name] patient woke up this morning 05/08/2019 at 0900 ([facility]KW). He was in his usual state of health, he ate breakfast and they held a conversation. Patient went to the bathroom and had a sudden onset of aphasia, right side weakness. He slumped over but did not fall or hit his head. They assisted patient to the floor. Patient was recently discharged 05/07/19 from [name]. He was discharged on full dose lovenox shots twice per day d/t non therapeutic INR. According to his mother, she had not been giving him the coumadin, just the lovenox, but he did not receive lovenox this morning. Last dose lovenox was 05/07/19 PM.  Per mother patient had been using a walker to ambulate since his hospitalization 1 month ago. Patient on 2L [place] 02 @ baseline.  Modified Rankin: Rankin Score=1. NIHSS:15   Called by Dr. Loreta Ave who spoke with the patient's  mother who had arrived to the [name].  She reports that he had been admitted to the [name] for weakness of the right lower extremity but was treated for mostly pneumonia and reported that he  been having trouble using the right leg and walking since Christmas.  The acute changes morning was confirmed but history was very inconsistent from what Dr. Loreta Ave received and what was provided to [place] over the phone.  CTH: no hemorrhage CTA: occlussion of the proximal A2 left anteriror cerebral arterythrombus in right innominate artery that extends into the right common carotid artery; CTP: core: 125 BP:129/76, BG: 192 tPA not Given as pt on full dose lovenox (100 mg bid).   [name] COURSE Jon [name] is a 54 y.o. 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 He had been admitted to the [facility] for weakness of the right lower extremity but was treated for mostly pneumonia and reported that he been having trouble using the right leg and walking since Christmas. Since D/C he had not been getting coumadin just the lovenox, but he did not receive lovenox this morning. Last dose lovenox was 05/07/19 PM. He did not receive IV t-PA due to Lovenox.  IR Thrombectomy Attempt - Left A2 occlusion, remained occluded at the conclusion  Stroke: L ACA/MCA infarcts w/o attempted IR, infarcts embolic d/t mechanical valve + PAF + low EF in setting of subtherapeutic anticoagulation   Code Stroke CT Head - 2.1 x 1.1  cm focus of abnormal hypodensity within the left aspect of the callosal body and possibly also involving the pericallosal gyrus on the left.  CT head - 05/10/2019 - Non flow limiting dissection was identified in the distal cervical segment.  CTA Head - The right ICA is markedly diminutive through the siphon region with sites of high-grade stenosis. Mixed plaque also results in mild/moderate stenosis of the supraclinoid right ICA. Abrupt occlusion of the proximal A2 left anterior cerebral artery. There appears to be some reconstitution of the left ACA distally.   CTA Neck - there is a greater stenosis within the proximal left common carotid artery than originally stated - likely moderate/severe. Thrombosis of the right innominate artery. Thrombus extends into the proximal right common carotid artery with high-grade stenosis. Sites of high-grade stenosis within the siphon region.   CT Perfusion - 125 mL region of hypoperfused parenchyma predominantly within the left ACA and ACA/MCA watershed territory, as well as right ACA/MCA watershed territory.  IR Thrombectomy Attempt - Left A2 occlusion, remained occluded at the conclusion  MRI head - L MCA/ACA infarcts   2D Echo - EF 25 to 30%.  No cardiac source of emboli identified.   Sars Corona Virus 2 - negative  LDL - 67  HgbA1c - 6.9  VTE prophylaxis - Heparin IV  Prescribed full dose lovenox and warfarin but not taking warfarin and missed last dose of lovenox, now on aspirin 81 mg, Heparin IV bridge to warfarin.  Therapy recommendations:  CIR  Disposition:  CIR   DNR present PTA  Acute Respiratory Failure, resolved  Intubated for IR  Extubated 05/10/19  CCM consulted  Atrial Fibrillation  Home anticoagulation:   Prescribed full dose lovenox and warfarin but not taking warfarin and missed last dose  of lovenox,  Now on IV heparin as bridge to warfarin  Last INR 1.1  Continue warfarin at d/c   Hypertension /  Hypotension  Home BP meds: Aldactone and Entresto  Current BP meds:   On and off levophed, now off  SBP goal < 180  Long-term BP goal normotensive  Hyperlipidemia  Home Lipid lowering medication: Crestor 40 mg daily  LDL 69, goal < 70  Current lipid lowering medication: Crestor 40 mg daily  Continue statin at discharge  Diabetes type II  Home diabetic meds: Jardiance  Current diabetic meds: SSI   HgbA1c 6.9, goal < 7.0  Dysphagia  Secondary to stroke  MBSS  On D1 honey thick liqs  Speech on board  Cortrak placed and pt made NPO  Leukocytosis  Aspiration PNA  WBC 18.1->16.3->14.8->19.8->17.8 (afebrile)  LLL Pneumonia - Vancomycin started 05/08/19 (one dose only)  CXR 1/2 concerning LLL inflammation, atx or ? edema  Resp CX normal resp flora  RN reports ongoing aspiration episodes  Repeat CXR suspicious for aspiration PNA  Add abx : Unasyn x 7 days (discussed w/ pharm d/t PCN allergy, warfarin use)   Upper airway congestion cleared with suctioning. sats stable. RR decreased after suctioning.    Other Stroke Risk Factors  Cigarette smoker will be advised to stop smoking  ETOH use, advised to drink no more than 1 alcoholic beverage per day.  Obesity, Body mass index is 32.59 kg/m., recommend weight loss, diet and exercise as appropriate   Hx stroke/TIA ? 05/2012 - punctate left cerebellum infarct with smaller punctate occipital lobe infarcts. Infarcts felt to be embolic secondary to post op afib, mechanical atrial valve.   Family hx stroke (father)  Coronary artery disease - CABG  Mechanical aortic valve (anticaogulation) Vascular surgery has seen and is available as needed.  Congestive Heart Failure - EF 25 to 30% - [facility] at which time ER was notes to be 20%  Other Active Problems  Mild Acute blood loss anemia 12.7-11.7-11.1->11.0->11.4   On Neurontin  On topamax  On  synthroid   DISCHARGE EXAM Blood pressure 118/80, pulse 69, temperature 99.3 F (37.4 C), temperature source Oral, resp. rate (!) 25, height 5\' 11"  (1.803 m), weight 106 kg, SpO2 100 %. Obese middle-aged Caucasian male not in distress. . Afebrile. Head is nontraumatic. Neck is supple without bruit.    Cardiac exam no murmur or gallop. Lungs are clear to auscultation. Distal pulses are well felt. Sittig  in bed. Neurological Exam : alert. Can follow 3-step commands. Profound dysarthria.  Speaks only occasional words with great difficulty.  Is able to understand very well.  Left gaze preference but crosses the midline. PERRL. hearing intact to voice, facial droop on right, tongue deviated to the right. , sensation intact in the face and limbs, moving left arm and hand spontaneously and purposefully, can lift right arm and leg off of the best with minimal drift. Withdraws x 4. States sensation intact and equal in all limbs.   Discharge Diet  NPO, TF via Cortrak  DISCHARGE PLAN  Disposition:  [facility], OT and ST  Monitor swallow - continue cortrak and TF for now  Oral suctioning prn  heparin IV bridge to warfarin for secondary stroke prevention. Continue additional aspirin 81 mg daily given cardiac disease.  Recommend ongoing stroke risk factor control by Primary Care Physician at time of discharge from inpatient rehabilitation.  Follow-up PCP Jon [name]., MD in 2 weeks following discharge from rehab.  Follow-up in [facility] in 4 weeks following discharge from rehab, office to schedule an appointment.   40 minutes were spent preparing discharge.  Annie Main, MSN, APRN, ANVP-BC, AGPCNP-BC Advanced Practice Stroke Nurse [facility] See Amion for Schedule & Pager information 05/13/2019 12:59 PM  I have personally obtained history,examined this patient, reviewed notes, independently viewed  imaging studies, participated in medical decision making and plan of care.ROS completed by me personally and pertinent positives fully documented  I have made any additions or clarifications directly to the above note. Agree with note above.    Delia Heady, MD Medical Director [facility] Pager: [phone] 05/13/2019 2:11 PM

## 2019-05-12 NOTE — Progress Notes (Signed)
Physical Therapy Treatment Patient Details Name: Barclay Lennox MRN: [id number] DOB: 12-Sep-1965 Today's Date: 05/12/2019    History of Present Illness 54 yo admitted with right weakness and left gaze with aphasia with acute left ACA anterior territory CVA s/p revascularization. VDRF 1/1-1/2. PMhx: dysarthria, aortic dissection s/p repair, AVR, GERD, CVA x 3    PT Comments    Pt alert, participative, follows simple direction well.  Emphasis on transition to EOB, sitting balance, sit to stand, balance and gait training with pt's arms over therapists' shoulders.    Follow Up Recommendations  CIR;Supervision/Assistance - 24 hour     Equipment Recommendations  None recommended by PT;Other (comment)(TBA next venue)    Recommendations for Other Services Rehab consult     Precautions / Restrictions Precautions Precautions: Fall Restrictions Weight Bearing Restrictions: No    Mobility  Bed Mobility Overal bed mobility: Needs Assistance Bed Mobility: Supine to Sit     Supine to sit: Min assist     General bed mobility comments: pt bridged to EOB with cuing only, He attempted to throw his legs off the bed to build momentum, but still needed minimal truncal assist.  Transfers Overall transfer level: Needs assistance Equipment used: None Transfers: Sit to/from Stand Sit to Stand: Mod assist         General transfer comment: cues for appropriate hand placement, assist to come forward and up, guarding R LE.  Ambulation/Gait Ambulation/Gait assistance: Mod assist;+2 physical assistance;+2 safety/equipment Gait Distance (Feet): 40 Feet Assistive device: None Gait Pattern/deviations: Step-through pattern;Step-to pattern;Decreased step length - left;Decreased stance time - right;Decreased step length - right;Decreased stride length Gait velocity: slower Gait velocity interpretation: <1.8 ft/sec, indicate of risk for recurrent falls General Gait Details: Use "three  muskateer" technique (pt's arms over therapists shoulders) to ambulate  allowing more w/shift help and allowing pt to concentrate on stepping with the right.   Stairs             Wheelchair Mobility    Modified Rankin (Stroke Patients Only) Modified Rankin (Stroke Patients Only) Pre-Morbid Rankin Score: Slight disability Modified Rankin: Moderately severe disability     Balance Overall balance assessment: Needs assistance Sitting-balance support: Single extremity supported;No upper extremity supported Sitting balance-Leahy Scale: Fair Sitting balance - Comments: able to sit without UE support   Standing balance support: Bilateral upper extremity supported;Single extremity supported   Standing balance comment: pt reached for and held to the footboard and then could w/bear on both LE's with a bias toward the R.                            Cognition Arousal/Alertness: Awake/alert Behavior During Therapy: Flat affect Overall Cognitive Status: Difficult to assess                                 General Comments: pt with expressive aphasia answering yes/no questions appropriately and attempting to expand upon statements but difficult to understand      Exercises Other Exercises Other Exercises: Warm up hip/knee flexion/ext AA/AROM with resistance bil in gross extention.    General Comments General comments (skin integrity, edema, etc.): O2 removed at beginning of session, sats on 2L at rest 96%.  After session, pt dyspneic, watery breath sounds, rapid RR and sats dropping to 85%,  quick return to mid 90'd on O2.      Pertinent Vitals/Pain  Pain Assessment: Faces Faces Pain Scale: No hurt Pain Intervention(s): Monitored during session    Home Living                      Prior Function            PT Goals (current goals can now be found in the care plan section) Acute Rehab PT Goals PT Goal Formulation: With patient Time For Goal  Achievement: 05/23/19 Potential to Achieve Goals: Good Progress towards PT goals: Progressing toward goals    Frequency    Min 4X/week      PT Plan Current plan remains appropriate    Co-evaluation              AM-PAC PT "6 Clicks" Mobility   Outcome Measure  Help needed turning from your back to your side while in a flat bed without using bedrails?: A Lot Help needed moving from lying on your back to sitting on the side of a flat bed without using bedrails?: A Lot Help needed moving to and from a bed to a chair (including a wheelchair)?: A Lot Help needed standing up from a chair using your arms (e.g., wheelchair or bedside chair)?: A Lot Help needed to walk in hospital room?: A Lot Help needed climbing 3-5 steps with a railing? : A Lot 6 Click Score: 12    End of Session   Activity Tolerance: Patient tolerated treatment well Patient left: in chair;with call bell/phone within reach;with chair alarm set Nurse Communication: Mobility status PT Visit Diagnosis: Other abnormalities of gait and mobility (R26.89);Other symptoms and signs involving the nervous system (R29.898);Hemiplegia and hemiparesis Hemiplegia - Right/Left: Right Hemiplegia - dominant/non-dominant: Dominant Hemiplegia - caused by: Cerebral infarction     Time: 1030-1053 PT Time Calculation (min) (ACUTE ONLY): 23 min  Charges:  $Gait Training: 8-22 mins $Therapeutic Activity: 8-22 mins                     05/12/2019  Jacinto Halim., PT Acute Rehabilitation Services [phone]  (pager) [phone]  (office)   Eliseo Gum Fina Heizer 05/12/2019, 11:16 AM

## 2019-05-12 NOTE — Progress Notes (Signed)
ANTICOAGULATION CONSULT NOTE  Pharmacy Consult for Heparin / Coumadin Indication: stroke and mechanical valve  Allergies  Allergen Reactions  . Hydrocodone-Acetaminophen Other (See Comments)    PT MOTHER DOES NOT REMEMBER REACTION  Only use in Emergency   . Hydromorphone Itching and Rash       . Other Other (See Comments)    ALL NARCOTICS-ITCHING, RASH   . Oxycodone Other (See Comments)    Unknown reaction  . Penicillins Rash    Did it involve swelling of the face/tongue/throat, SOB, or low BP? Unknown Did it involve sudden or severe rash/hives, skin peeling, or any reaction on the inside of your mouth or nose? Unknown Did you need to seek medical attention at a hospital or doctor's office? Unknown When did it last happen?unknown If all above answers are "NO", may proceed with cephalosporin use.  . Dilaudid [Hydromorphone Hcl] Itching and Rash    Patient Measurements: Height: 5\' 11"  (180.3 cm) Weight: 233 lb 11 oz (106 kg) IBW/kg (Calculated) : 75.3 Heparin Dosing Weight: 97.7kg  Vital Signs: Temp: 98.6 F (37 C) (01/05 0800) Temp Source: Axillary (01/05 0800) BP: 135/76 (01/05 0800) Pulse Rate: 69 (01/05 0800)  Labs: Recent Labs    05/09/19 1028 05/09/19 2009 05/10/19 0505 05/10/19 1347 05/11/19 0135 05/12/19 0716  HGB  --    < > 11.7*  --  11.1* 11.0*  HCT  --   --  36.6*  --  34.3* 34.0*  PLT  --   --  313  --  294 292  LABPROT  --   --   --   --   --  15.6*  INR  --   --   --   --   --  1.3*  HEPARINUNFRC 0.25*  --  0.47 0.55 0.37 0.35  CREATININE  --   --   --   --   --  0.74  TROPONINIHS 230*  --   --   --   --   --    < > = values in this interval not displayed.    Estimated Creatinine Clearance: 132.3 mL/min (by C-G formula based on SCr of 0.74 mg/dL).   Assessment: 16 YOM presented to the ED with stroke symptoms. S/p recent discharge from [facility]. He is supposed to be on Coumadin and Lovenox for history of a mechanical valve.   Patient is now s/p thrombectomy and continues on IV heparin.  Pharmacy consulted to resume Coumadin on 05/10/18.  Heparin level therapeutic and INR is below goal as expected.  No bleeding reported.  Home Coumadin regimen: 5mg  alternating with 2.5mg  daily  Goal of Therapy:  Heparin level 0.3-0.5 units/ml  INR 2.5 - 3.5 Monitor platelets by anticoagulation protocol: Yes   Plan:  Continue heparin gtt at 1400 units/hr Repeat Coumadin 5mg  PO today Daily heparin level, CBC and PT / INR  Tamra Koos D. , PharmD, BCPS, BCCCP 05/12/2019, 9:01 AM

## 2019-05-12 NOTE — Progress Notes (Signed)
Inpatient Rehabilitation-Admissions Coordinator   Spoke with pt's sister in law per pt's mom's request. The sister-in-law Boyd Kerbs has confirmed DC support and is willing to provide the anticipated assist as needed. AC will follow along for medical readiness and possible admit.   Cheri Rous, OTR/L  Rehab Admissions Coordinator  [phone] 05/12/2019 1:40 PM

## 2019-05-12 NOTE — Progress Notes (Signed)
STROKE TEAM PROGRESS NOTE   INTERVAL HISTORY Patient is sitting up in bed.  Is still having trouble swallowing and coughs a lot.  Speech therapy is at the bedside doing a swallow eval.  Continues to have profound speech and communication difficulty.  His comprehension seems well preserved.  Blood pressure adequately controlled.  He is afebrile but chest x-ray shows possible infiltrate and aspiration pneumonia OBJECTIVE Vitals:   05/12/19 0500 05/12/19 0600 05/12/19 0700 05/12/19 0800  BP: 124/66 120/65 115/73 135/76  Pulse: 73 66 71 69  Resp: (!) 31 (!) 32 (!) 28 (!) 29  Temp:    98.6 F (37 C)  TempSrc:    Axillary  SpO2: 96% 93% 92% 96%  Weight:      Height:        CBC:  Recent Labs  Lab 05/11/19 0135 05/12/19 0716  WBC 14.8* 19.8*  NEUTROABS 12.1* 16.9*  HGB 11.1* 11.0*  HCT 34.3* 34.0*  MCV 88.6 89.2  PLT 294 292    Basic Metabolic Panel:  Recent Labs  Lab 05/09/19 0112 05/11/19 0135 05/12/19 0716  NA 138  --  138  K 3.7  --  3.5  CL 106  --  104  CO2 17*  --  23  GLUCOSE 138*  --  92  BUN 10  --  6  CREATININE 0.78  --  0.74  CALCIUM 7.8*  --  8.6*  MG 2.1 1.9 1.8  PHOS 3.5 3.2 4.0    Lipid Panel:     Component Value Date/Time   CHOL 132 05/09/2019 0504   TRIG 157 (H) 05/09/2019 0504   HDL 34 (L) 05/09/2019 0504   CHOLHDL 3.9 05/09/2019 0504   VLDL 31 05/09/2019 0504   LDLCALC 67 05/09/2019 0504   HgbA1c:  Lab Results  Component Value Date   HGBA1C 6.9 (H) 05/09/2019   Urine Drug Screen: No results found for: LABOPIA, COCAINSCRNUR, LABBENZ, AMPHETMU, THCU, LABBARB  Alcohol Level No results found for: ETH  IMAGING  CT HEAD CODE STROKE WO CONTRAST 05/08/2019 2.1 x 1.1 cm focus of abnormal hypodensity within the left aspect of the callosal body and possibly also involving the pericallosal gyrus on the left. Findings are new from prior examination 10/26/2018 and may reflect an acute infarct or other lesion. Consider brain MRI for further  evaluation. 021 11:01   CT Code Stroke CTA Head W/WO contrast 05/08/2019   ADDENDUM: Upon further review, there is likely greater stenosis within the proximal left common carotid artery than originally stated. This is on the basis of circumferential plaque or thrombus. Exact quantification of stenosis is difficult due to vessel tortuosity, but likely moderate/severe.  1. 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  2. 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 Thrombus extends into the proximal right common carotid artery with high-grade stenosis. There is a progressive decrease in this stenosis towards the distal CCA. There is abrupt caliber change of the cervical right ICA just distal to the bulb. New from prior examinations, the cervical ICA is markedly asymmetrically diminutive throughout the neck and through the siphon region. Sites of high-grade stenosis within the siphon region.  3. The left common and cervical internal carotid arteries are patent without significant stenosis. Circumferential plaque or thrombus within the proximal left common carotid artery.  4. Bilateral vertebral arteries patent within the neck without significant stenosis.  5. Extensive airspace disease  within the partially imaged left lung apex suspicious for pneumonia.   CTA head 05/08/2019   1. The right ICA is markedly diminutive through the siphon region with sites of high-grade stenosis. Mixed plaque also results in mild/moderate stenosis of the supraclinoid right ICA.  2. Abrupt occlusion of the proximal A2 left anterior cerebral artery. There appears to be some reconstitution of the left ACA distally.  3. No other intracranial large vessel occlusion or proximal high-grade arterial stenosis is identified.   CT Code Stroke Cerebral Perfusion with contrast 05/08/2019 125 mL region of hypoperfused parenchyma predominantly  within the left ACA and ACA/MCA watershed territory, as well as right ACA/MCA watershed territory. The perfusion software does not detect any core infarct. However, there were clear changes of infarction within the corpus callosum and left pericallosal gyrus on noncontrast head CT performed earlier the same day.   DG Chest Port 1 View 05/08/2019 1. Interval intubation with endotracheal tube tip terminating between the thoracic inlet and carina. Nasogastric tube courses below the diaphragm with tip out of field of view.  2. Diffuse interstitial opacities throughout the left lung, improved compared to prior radiograph.  05/09/2019 Stable support apparatus. Stable diffuse reticular densities are noted in left lung concerning for inflammation, atelectasis or possibly edema.  CT Angio Chest/Abd/Pel for Dissection W and/or W/WO 05/08/2019 Status post surgical repair of ascending thoracic aortic aneurysm as well as aortic valve repair. No definite evidence of acute dissection or aneurysm is seen involving the thoracic or abdominal aorta. 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 Stable diffuse lung opacity is noted concerning for pneumonia or scarring. Bilateral fat containing inguinal hernias are noted, right greater than left. Aortic Atherosclerosis (ICD10-I70.0) and Emphysema (ICD10-J43.9).  IR Thrombectomy Attempt 05/08/19  Left A2 occlusion, remained occluded at the conclusion  CT shows no SAH at conclusion.  Staining of the infarction of left ACA territory  Non-flow limiting dissection at the distal left cervical ICA  CT Head WO Contrast (limited) 05/10/2019  Status post ultrasound guided access right common femoral artery for left-sided cervical and cerebral angiogram and attempted thrombectomy of left A2 occlusion, with 3 passes primary aspiration  and a single pass of mechanical thrombectomy yielding no significant improvement in  flow through the A2 segment.  Non flow limiting dissection was identified in the distal cervical segment.  MRI Brain WO Contrast  05/11/2019 Multiple acute infarcts as detailed above primarily involving left ACA and MCA territories.  Transthoracic Echocardiogram  05/09/2019  1. Left ventricular ejection fraction, by visual estimation, is 25 to 30%. The left ventricle has severely decreased function. There is no left ventricular hypertrophy.  2. Elevated left ventricular end-diastolic pressure.  3. Left ventricular diastolic parameters are consistent with Grade II diastolic dysfunction (pseudonormalization).  4. The left ventricle demonstrates global hypokinesis.  5. Global right ventricle has normal systolic function.The right ventricular size is normal. No increase in right ventricular wall thickness.  6. Left atrial size was normal.  7. Right atrial size was normal.  8. The mitral valve is normal in structure. Mild mitral valve regurgitation. No evidence of mitral stenosis.  9. The tricuspid valve is normal in structure. 10. Aortic valve regurgitation is not visualized. Mild aortic valve stenosis. 11. Mechanical prosthesis in the aortic valve position. 12. The pulmonic valve was normal in structure. Pulmonic valve regurgitation is not visualized. 13. Normal pulmonary artery systolic pressure. 14. The inferior vena cava is  normal in size with greater than 50% respiratory variability, suggesting right atrial pressure of 3 mmHg. 15. When compared to the prior study from 05/01/2018 LVEF has decreased from 55-60% to 25-30% with diffuse hypokinesis. Transaortic gradients across the mechanical aortic valve are unchanged.  ECG - SR rate 62 BPM. (See cardiology reading for complete details)   PHYSICAL EXAM    Obese middle-aged Caucasian male not in distress. . Afebrile. Head is nontraumatic. Neck is supple without bruit.    Cardiac exam no murmur or gallop. Lungs are clear to auscultation. Distal  pulses are well felt. Sittig  in bed. Neurological Exam : alert. Can follow 3-step commands. Profound dysarthria.  Speaks only occasional words with great difficulty.  Is able to understand very well.  Left gaze preference but crosses the midline. PERRL. hearing intact to voice, facial droop on right, tongue deviated to the right. , sensation intact in the face and limbs, moving left arm and hand spontaneously and purposefully, can lift right arm and leg off of the best with minimal drift. Withdraws x 4. States sensation intact and equal in all limbs.    ASSESSMENT/PLAN [name] is a 54 y.o. male with history of prior stroke with residual dysarthria (ambulates with a walker), anxiety / depression, DM, PAF, ongoing tobacco use, CAD / CABG, type aortic dissection in 2014 with repair that was complicated and involved a brachiocephalic graft to the left carotid as the dissection extended into the brachiocephalic and left carotid,  aortic stenosis status post mechanical aortic valve; on 2L [place] 02 @ baseline presented to the ED as a code stroke for sudden onset of leftward gaze, aphasia, and right-sided flaccid paralysis. He had been admitted to the [facility] for weakness of the right lower extremity but was treated for mostly pneumonia and reported that he been having trouble using the right leg and walking since Christmas. Since D/C he had not been getting coumadin just the lovenox, but he did not receive lovenox this morning. Last dose lovenox  was 05/07/19 PM. He did not receive IV t-PA due to Lovenox.  IR Thrombectomy Attempt - Left A2 occlusion, remained occluded at the conclusion  Stroke: L ACA/MCA infarcts w/o attempted IR, infarcts embolic d/t mechanical valve + PAF + low EF in setting of subtherapeutic anticoagulation   Code Stroke CT Head - 2.1 x 1.1 cm focus of abnormal hypodensity within the left aspect of the callosal body and possibly also involving the pericallosal gyrus on  the left.  CT head - 05/10/2019 - Non flow limiting dissection was identified in the distal cervical segment.  CTA Head - The right ICA is markedly diminutive through the siphon region with sites of high-grade stenosis. Mixed plaque also results in mild/moderate stenosis of the supraclinoid right ICA. Abrupt occlusion of the proximal A2 left anterior cerebral artery. There appears to be some reconstitution of the left ACA distally.   CTA Neck - there is a greater stenosis within the proximal left common carotid artery than originally stated - likely moderate/severe. Thrombosis of the right innominate artery. Thrombus extends into the proximal right common carotid artery with high-grade stenosis. Sites of high-grade stenosis within the siphon region.   CT Perfusion - 125 mL region of hypoperfused parenchyma predominantly within the left ACA and ACA/MCA watershed territory, as well as right ACA/MCA watershed territory.  IR Thrombectomy Attempt - Left A2 occlusion, remained occluded at the conclusion  MRI head - L MCA/ACA infarcts   2D Echo -  EF 25 to 30%.  No cardiac source of emboli identified.   Sars Corona Virus 2 - negative  LDL - 67  HgbA1c - 6.9  VTE prophylaxis - Heparin IV  Prescribed full dose lovenox and warfarin but not taking warfarin and missed last dose of lovenox, now on aspirin 81 mg, Heparin IV bridge to warfarin.  Therapy recommendations:  CIR  Disposition:  Pending  DNR at baseline  Transfer to the floor  Acute Respiratory Failure, resolved  Intubated for IR  Extubated 05/10/19  CCM signed off  Atrial Fibrillation  Home anticoagulation:   Prescribed full dose lovenox and warfarin but not taking warfarin and missed last dose of lovenox,  Last INR 1.5  Now on IV heparin as bridge to warfarin Lab Results  Component Value Date   INR 1.3 (H) 05/12/2019   INR 1.5 (H) 05/09/2019   INR 1.4 (H) 05/08/2019   Plan warfarin at d/c   Hypertension /  Hypotension  Home BP meds: Aldactone and Entresto  Current BP meds:   On and off levophed, on this am  SBP goal < 180  off Levophed . SBP 110-130 . Long-term BP goal normotensive  Hyperlipidemia  Home Lipid lowering medication: Crestor 40 mg daily  LDL 69, goal < 70  Current lipid lowering medication: Crestor 40 mg daily  Continue statin at discharge  Diabetes  Home diabetic meds: Jardiance  Current diabetic meds: SSI   HgbA1c 6.9, goal < 7.0 Recent Labs    05/11/19 2341 05/12/19 0330 05/12/19 0802  GLUCAP 181* 70 88    Dysphagia . Secondary to stroke . MBSS . On D1 honey thick liqs . Speech on board . SLP to reassess given periods of aspiration   Leukocytosis   WBC 18.1->16.3->14.8->19.8 (afebrile)  LLL Pneumonia - Vancomycin started 05/08/19 (one dose only)  CXR 1/2 concerning LLL inflammation, atx or ? edema  Resp CX normal resp flora  RN reports ongoing aspiration episodes  SLP to reassess, ? Temporary Cortrak  Repeat CXR suspicious for aspiration PNA  Add abx : Unasyn x 7 days (discussed w/ pharm d/t PCN allergy, warfarin use)    Other Stroke Risk Factors  Cigarette smoker will be advised to stop smoking  ETOH use, advised to drink no more than 1 alcoholic beverage per day.  Obesity, Body mass index is 32.59 kg/m., recommend weight loss, diet and exercise as appropriate   Hx stroke/TIA  05/2012 - punctate left cerebellum infarct with smaller punctate  occipital lobe infarcts. Infarcts felt to be embolic secondary to post op afib, mechanical atrial valve.   Family hx stroke (father)  Coronary artery disease - CABG  Mechanical aortic valve (anticaogulation) Vascular surgery has seen and is available as needed.  Congestive Heart Failure - EF 25 to 30% - Pt saw cardiologist Dr Beatrix Fetters [facility] 04/27/19 at which time ER was notes to be 20%  Other Active Problems  Mild Acute blood loss anemia 12.7-11.7-11.1->11.0  On  Neurontin  On topamax  On synthroid   Hospital day # 4  He continues to have profound dysarthria as well as dysphagia and even though he is on a diet he has not been able to eat a lot and may not be able to sustain his nutrition.  Discussed with speech therapy patient will likely need to be made n.p.o. given abnormal chest x-ray and swallowing difficulty and have core track tube placed and tube feeds started.  Continue mobilize out of bed.  Rehab  consults.  Discussed with patient and speech therapist.  We will start Zosyn antibiotic for aspiration pneumonia. This patient is critically ill and at significant risk of neurological worsening, death and care requires constant monitoring of vital signs, hemodynamics,respiratory and cardiac monitoring, extensive review of multiple databases, frequent neurological assessment, discussion with family, other specialists and medical decision making of high complexity.I have made any additions or clarifications directly to the above note.This critical care time does not reflect procedure time, or teaching time or supervisory time of PA/NP/Med Resident etc but could involve care discussion time.  I spent 30 minutes of neurocritical care time  in the care of  this patient.     Delia Heady, MD Medical Director [facility] Pager: [phone] 05/12/2019 10:27 AM   To contact Stroke Continuity provider, please refer to [email or web address]. After hours, contact General Neurology

## 2019-05-12 NOTE — Progress Notes (Signed)
  Speech Language Pathology Treatment: Dysphagia  Patient Details Name: Jon [name] MRN: [id number] DOB: 1965-05-21 Today's Date: 05/12/2019 Time: 7616-0737 SLP Time Calculation (min) (ACUTE ONLY): 45 min  Assessment / Plan / Recommendation Clinical Impression  RN notified SLP about concern for pt's diet tolerance since MBS. Despite use of chin tuck, pt has been having overt coughing episodes with honey thick liquids, with associated increase in chest congestion. She reports worsening lung sounds over the last 24 hours. During skilled observation pt needs Min cues to implement his chin tuck, with good use likely related to previous experience with it. Despite using precautions outlined in MBS, I am noticing strong coughing episodes as well. Per MBS report, his cough was not effectively clearing aspirates with thinner consistencies during the study (aspiration with honey thick liquids was not observed). No coughing is noted with purees and oral cavity appears to be similarly impaired.   Given acute change in respiratory status on current diet, limited intake per RN, and significant effort needed for PO intake, would consider temporary, alternative means of nutrition, keeping pt NPO except for snacks of puree. This was discussed collaboratively with MD, nursing, and dietician. SLP provided education to the patient and per his request, to his mother over the phone as well. She voiced concerns about his longer-term potential to return to a PO diet. She and the pt are both open to Cortrak at this time to facilitate more adequate nutritional intake while also trying to reduce the risk of aspiration. Heavy emphasis was placed on education this session. Will continue to follow for swallowing and speech production.   HPI HPI: 54 yo admitted with right weakness and left gaze with aphasia with acute left ACA anterior territory CVA s/p revascularization. VDRF 1/1-1/2. PMhx: dysarthria, aortic dissection s/p  repair, AVR, GERD, CVA x 3. Prior CVA resulted in verbosity, cognitive impairment including impulsivity, mild dysphagia. In 2014 MBS recommended regualr thin with a chin tuck.       SLP Plan  Continue with current plan of care       Recommendations  Diet recommendations: NPO;Other(comment)(can have snacks of puree) Medication Administration: Crushed with puree Supervision: Patient able to self feed;Full supervision/cueing for compensatory strategies Compensations: Slow rate;Small sips/bites;Chin tuck Postural Changes and/or Swallow Maneuvers: Seated upright 90 degrees                Oral Care Recommendations: Oral care BID Follow up Recommendations: Inpatient Rehab SLP Visit Diagnosis: Dysphagia, oropharyngeal phase (R13.12) Plan: Continue with current plan of care       GO                 Mahala Menghini., M.A. CCC-SLP Acute Rehabilitation Services Pager [phone] Office [phone]  05/12/2019, 1:53 PM

## 2019-05-13 ENCOUNTER — Inpatient Hospital Stay (HOSPITAL_COMMUNITY)
Admission: RE | Admit: 2019-05-13 | Discharge: 2019-06-09 | DRG: 056 | Disposition: A | Discharge status: Home Health | Payer: Medicaid Other | Source: Intra-hospital | Attending: Physical Medicine & Rehabilitation | Admitting: Physical Medicine & Rehabilitation

## 2019-05-13 ENCOUNTER — Other Ambulatory Visit: Payer: Self-pay

## 2019-05-13 ENCOUNTER — Encounter (HOSPITAL_COMMUNITY): Payer: Self-pay | Admitting: Student in an Organized Health Care Education/Training Program

## 2019-05-13 ENCOUNTER — Encounter (HOSPITAL_COMMUNITY): Payer: Self-pay | Admitting: Physical Medicine & Rehabilitation

## 2019-05-13 DIAGNOSIS — Z9981 Dependence on supplemental oxygen: Secondary | ICD-10-CM

## 2019-05-13 DIAGNOSIS — Z79899 Other long term (current) drug therapy: Secondary | ICD-10-CM

## 2019-05-13 DIAGNOSIS — E46 Unspecified protein-calorie malnutrition: Secondary | ICD-10-CM | POA: Diagnosis present

## 2019-05-13 DIAGNOSIS — E876 Hypokalemia: Secondary | ICD-10-CM

## 2019-05-13 DIAGNOSIS — I350 Nonrheumatic aortic (valve) stenosis: Secondary | ICD-10-CM | POA: Diagnosis present

## 2019-05-13 DIAGNOSIS — I6990 Unspecified sequelae of unspecified cerebrovascular disease: Secondary | ICD-10-CM

## 2019-05-13 DIAGNOSIS — D62 Acute posthemorrhagic anemia: Secondary | ICD-10-CM | POA: Diagnosis present

## 2019-05-13 DIAGNOSIS — F419 Anxiety disorder, unspecified: Secondary | ICD-10-CM | POA: Diagnosis present

## 2019-05-13 DIAGNOSIS — F329 Major depressive disorder, single episode, unspecified: Secondary | ICD-10-CM

## 2019-05-13 DIAGNOSIS — I10 Essential (primary) hypertension: Secondary | ICD-10-CM

## 2019-05-13 DIAGNOSIS — J449 Chronic obstructive pulmonary disease, unspecified: Secondary | ICD-10-CM | POA: Diagnosis present

## 2019-05-13 DIAGNOSIS — I69322 Dysarthria following cerebral infarction: Secondary | ICD-10-CM | POA: Diagnosis not present

## 2019-05-13 DIAGNOSIS — Z823 Family history of stroke: Secondary | ICD-10-CM

## 2019-05-13 DIAGNOSIS — I5043 Acute on chronic combined systolic (congestive) and diastolic (congestive) heart failure: Secondary | ICD-10-CM | POA: Diagnosis not present

## 2019-05-13 DIAGNOSIS — I69391 Dysphagia following cerebral infarction: Secondary | ICD-10-CM | POA: Diagnosis not present

## 2019-05-13 DIAGNOSIS — Z950 Presence of cardiac pacemaker: Secondary | ICD-10-CM

## 2019-05-13 DIAGNOSIS — Z7951 Long term (current) use of inhaled steroids: Secondary | ICD-10-CM

## 2019-05-13 DIAGNOSIS — I69320 Aphasia following cerebral infarction: Secondary | ICD-10-CM | POA: Diagnosis present

## 2019-05-13 DIAGNOSIS — E8809 Other disorders of plasma-protein metabolism, not elsewhere classified: Secondary | ICD-10-CM | POA: Diagnosis present

## 2019-05-13 DIAGNOSIS — G4733 Obstructive sleep apnea (adult) (pediatric): Secondary | ICD-10-CM | POA: Diagnosis present

## 2019-05-13 DIAGNOSIS — I69920 Aphasia following unspecified cerebrovascular disease: Secondary | ICD-10-CM

## 2019-05-13 DIAGNOSIS — R195 Other fecal abnormalities: Secondary | ICD-10-CM | POA: Diagnosis not present

## 2019-05-13 DIAGNOSIS — F17210 Nicotine dependence, cigarettes, uncomplicated: Secondary | ICD-10-CM

## 2019-05-13 DIAGNOSIS — Z952 Presence of prosthetic heart valve: Secondary | ICD-10-CM | POA: Diagnosis not present

## 2019-05-13 DIAGNOSIS — R739 Hyperglycemia, unspecified: Secondary | ICD-10-CM | POA: Diagnosis not present

## 2019-05-13 DIAGNOSIS — R1310 Dysphagia, unspecified: Secondary | ICD-10-CM

## 2019-05-13 DIAGNOSIS — J690 Pneumonitis due to inhalation of food and vomit: Secondary | ICD-10-CM

## 2019-05-13 DIAGNOSIS — Z833 Family history of diabetes mellitus: Secondary | ICD-10-CM | POA: Diagnosis not present

## 2019-05-13 DIAGNOSIS — G43909 Migraine, unspecified, not intractable, without status migrainosus: Secondary | ICD-10-CM

## 2019-05-13 DIAGNOSIS — I482 Chronic atrial fibrillation, unspecified: Secondary | ICD-10-CM

## 2019-05-13 DIAGNOSIS — Z8673 Personal history of transient ischemic attack (TIA), and cerebral infarction without residual deficits: Secondary | ICD-10-CM

## 2019-05-13 DIAGNOSIS — I110 Hypertensive heart disease with heart failure: Secondary | ICD-10-CM | POA: Diagnosis present

## 2019-05-13 DIAGNOSIS — I5042 Chronic combined systolic (congestive) and diastolic (congestive) heart failure: Secondary | ICD-10-CM | POA: Diagnosis not present

## 2019-05-13 DIAGNOSIS — I69351 Hemiplegia and hemiparesis following cerebral infarction affecting right dominant side: Principal | ICD-10-CM

## 2019-05-13 DIAGNOSIS — Z7901 Long term (current) use of anticoagulants: Secondary | ICD-10-CM

## 2019-05-13 DIAGNOSIS — E039 Hypothyroidism, unspecified: Secondary | ICD-10-CM | POA: Diagnosis present

## 2019-05-13 DIAGNOSIS — Z7982 Long term (current) use of aspirin: Secondary | ICD-10-CM | POA: Diagnosis not present

## 2019-05-13 DIAGNOSIS — I639 Cerebral infarction, unspecified: Secondary | ICD-10-CM | POA: Diagnosis present

## 2019-05-13 DIAGNOSIS — R52 Pain, unspecified: Secondary | ICD-10-CM

## 2019-05-13 DIAGNOSIS — Z79890 Hormone replacement therapy (postmenopausal): Secondary | ICD-10-CM | POA: Diagnosis not present

## 2019-05-13 DIAGNOSIS — R791 Abnormal coagulation profile: Secondary | ICD-10-CM

## 2019-05-13 DIAGNOSIS — R7309 Other abnormal glucose: Secondary | ICD-10-CM | POA: Diagnosis not present

## 2019-05-13 DIAGNOSIS — I480 Paroxysmal atrial fibrillation: Secondary | ICD-10-CM | POA: Diagnosis present

## 2019-05-13 DIAGNOSIS — I6349 Cerebral infarction due to embolism of other cerebral artery: Secondary | ICD-10-CM

## 2019-05-13 DIAGNOSIS — K219 Gastro-esophageal reflux disease without esophagitis: Secondary | ICD-10-CM | POA: Diagnosis present

## 2019-05-13 DIAGNOSIS — R471 Dysarthria and anarthria: Secondary | ICD-10-CM

## 2019-05-13 LAB — BASIC METABOLIC PANEL
Anion gap: 10 (ref 5–15)
BUN: 7 mg/dL (ref 6–20)
CO2: 25 mmol/L (ref 22–32)
Calcium: 8.5 mg/dL — ABNORMAL LOW (ref 8.9–10.3)
Chloride: 103 mmol/L (ref 98–111)
Creatinine, Ser: 0.79 mg/dL (ref 0.61–1.24)
GFR calc Af Amer: >60 mL/min (ref >60)
GFR calc non Af Amer: >60 mL/min (ref >60)
Glucose, Bld: 132 mg/dL — ABNORMAL HIGH (ref 70–99)
Potassium: 3.4 mmol/L — ABNORMAL LOW (ref 3.5–5.1)
Sodium: 138 mmol/L (ref 135–145)

## 2019-05-13 LAB — GLUCOSE, CAPILLARY
Glucose-Capillary: 137 mg/dL — ABNORMAL HIGH (ref 70–99)
Glucose-Capillary: 141 mg/dL — ABNORMAL HIGH (ref 70–99)
Glucose-Capillary: 124 mg/dL — ABNORMAL HIGH (ref 70–99)
Glucose-Capillary: 113 mg/dL — ABNORMAL HIGH (ref 70–99)
Glucose-Capillary: 81 mg/dL (ref 70–99)

## 2019-05-13 LAB — HEPARIN LEVEL (UNFRACTIONATED)
Heparin Unfractionated: 0.21 IU/mL — ABNORMAL LOW (ref 0.30–0.70)
Heparin Unfractionated: 0.24 IU/mL — ABNORMAL LOW (ref 0.30–0.70)
Heparin Unfractionated: 0.26 IU/mL — ABNORMAL LOW (ref 0.30–0.70)

## 2019-05-13 LAB — CBC
HCT: 36 % — ABNORMAL LOW (ref 39.0–52.0)
Hemoglobin: 11.4 g/dL — ABNORMAL LOW (ref 13.0–17.0)
MCH: 28.5 pg (ref 26.0–34.0)
MCHC: 31.7 g/dL (ref 30.0–36.0)
MCV: 90 fL (ref 80.0–100.0)
Platelets: 263 10*3/uL (ref 150–400)
RBC: 4 MIL/uL — ABNORMAL LOW (ref 4.22–5.81)
RDW: 16.4 % — ABNORMAL HIGH (ref 11.5–15.5)
WBC: 17.8 10*3/uL — ABNORMAL HIGH (ref 4.0–10.5)
nRBC: 0 % (ref 0.0–0.2)

## 2019-05-13 LAB — PROTIME-INR
INR: 1.1 (ref 0.8–1.2)
Prothrombin Time: 14.1 seconds (ref 11.4–15.2)

## 2019-05-13 MED ORDER — INSULIN ASPART 100 UNIT/ML ~~LOC~~ SOLN: 2.0000 [IU] | SUBCUTANEOUS | 11 refills | Status: DC

## 2019-05-13 MED ORDER — IPRATROPIUM-ALBUTEROL 0.5-2.5 (3) MG/3ML IN SOLN
3.0000 mL | Freq: Four times a day (QID) | RESPIRATORY_TRACT | Status: DC
  Administered 2019-05-13: 3 mL via RESPIRATORY_TRACT
  Filled 2019-05-13: qty 3

## 2019-05-13 MED ORDER — CHLORHEXIDINE GLUCONATE CLOTH 2 % EX PADS: 6.0000 | MEDICATED_PAD | Freq: Every day | CUTANEOUS | Status: DC

## 2019-05-13 MED ORDER — PROCHLORPERAZINE EDISYLATE 10 MG/2ML IJ SOLN: 5.0000 mg | Freq: Four times a day (QID) | INTRAMUSCULAR | Status: DC | PRN

## 2019-05-13 MED ORDER — OSMOLITE 1.5 CAL PO LIQD
1000.0000 mL | ORAL | Status: DC
  Filled 2019-05-13 (×2): qty 1000

## 2019-05-13 MED ORDER — INSULIN ASPART 100 UNIT/ML ~~LOC~~ SOLN: 2.0000 [IU] | SUBCUTANEOUS | Status: DC

## 2019-05-13 MED ORDER — TOPIRAMATE 25 MG PO TABS
50.0000 mg | ORAL_TABLET | Freq: Every day | ORAL | Status: DC
  Administered 2019-05-13: 50 mg

## 2019-05-13 MED ORDER — WARFARIN SODIUM 7.5 MG PO TABS: 7.5000 mg | ORAL_TABLET | Freq: Once | ORAL | Status: DC

## 2019-05-13 MED ORDER — POTASSIUM CHLORIDE 20 MEQ PO PACK
20.0000 meq | PACK | Freq: Every day | ORAL | Status: DC
  Administered 2019-05-14 – 2019-05-17 (×4): 20 meq
  Filled 2019-05-13 (×5): qty 1

## 2019-05-13 MED ORDER — PROCHLORPERAZINE 25 MG RE SUPP: 12.5000 mg | Freq: Four times a day (QID) | RECTAL | Status: DC | PRN

## 2019-05-13 MED ORDER — UMECLIDINIUM BROMIDE 62.5 MCG/INH IN AEPB
1.0000 | INHALATION_SPRAY | Freq: Every day | RESPIRATORY_TRACT | Status: DC
  Filled 2019-05-13: qty 7

## 2019-05-13 MED ORDER — SENNOSIDES-DOCUSATE SODIUM 8.6-50 MG PO TABS: 1.0000 | ORAL_TABLET | Freq: Every evening | ORAL | Status: DC | PRN

## 2019-05-13 MED ORDER — ACETAMINOPHEN 160 MG/5ML PO SOLN: 650.0000 mg | ORAL | 0 refills | Status: DC | PRN

## 2019-05-13 MED ORDER — FREE WATER
100.0000 mL | Freq: Three times a day (TID) | Status: DC
  Administered 2019-05-13 – 2019-05-29 (×62): 100 mL

## 2019-05-13 MED ORDER — TOPIRAMATE 50 MG PO TABS: 50.0000 mg | ORAL_TABLET | Freq: Every day | ORAL | Status: DC

## 2019-05-13 MED ORDER — WARFARIN SODIUM 7.5 MG PO TABS
7.5000 mg | ORAL_TABLET | Freq: Once | ORAL | Status: DC
  Filled 2019-05-13: qty 1

## 2019-05-13 MED ORDER — ORAL CARE MOUTH RINSE: 15.0000 mL | Freq: Two times a day (BID) | OROMUCOSAL | 0 refills | Status: DC

## 2019-05-13 MED ORDER — PRO-STAT SUGAR FREE PO LIQD
30.0000 mL | Freq: Two times a day (BID) | ORAL | Status: DC
  Administered 2019-05-13 – 2019-05-16 (×6): 30 mL
  Filled 2019-05-13 (×6): qty 30

## 2019-05-13 MED ORDER — ALUM & MAG HYDROXIDE-SIMETH 200-200-20 MG/5ML PO SUSP: 30.0000 mL | ORAL | Status: DC | PRN

## 2019-05-13 MED ORDER — PANTOPRAZOLE SODIUM 40 MG PO PACK
40.0000 mg | PACK | Freq: Every day | ORAL | Status: DC
  Administered 2019-05-14 – 2019-06-09 (×27): 40 mg
  Filled 2019-05-13 (×26): qty 20

## 2019-05-13 MED ORDER — HEPARIN (PORCINE) 25000 UT/250ML-% IV SOLN
1600.0000 [IU]/h | INTRAVENOUS | Status: DC
  Administered 2019-05-13: 1500 [IU]/h via INTRAVENOUS
  Administered 2019-05-14: 1700 [IU]/h via INTRAVENOUS
  Administered 2019-05-14: 1750 [IU]/h via INTRAVENOUS
  Administered 2019-05-15: 1700 [IU]/h via INTRAVENOUS
  Administered 2019-05-15 – 2019-05-17 (×3): 1650 [IU]/h via INTRAVENOUS
  Administered 2019-05-17: 1600 [IU]/h via INTRAVENOUS
  Filled 2019-05-13 (×7): qty 250

## 2019-05-13 MED ORDER — PRO-STAT SUGAR FREE PO LIQD
30.0000 mL | Freq: Two times a day (BID) | ORAL | Status: DC
  Administered 2019-05-13: 30 mL
  Filled 2019-05-13: qty 30

## 2019-05-13 MED ORDER — GUAIFENESIN-DM 100-10 MG/5ML PO SYRP
5.0000 mL | ORAL_SOLUTION | Freq: Four times a day (QID) | ORAL | Status: DC | PRN
  Administered 2019-05-17 – 2019-05-18 (×3): 10 mL via ORAL
  Filled 2019-05-13 (×3): qty 10

## 2019-05-13 MED ORDER — ASPIRIN 81 MG PO CHEW: 81.0000 mg | CHEWABLE_TABLET | Freq: Every day | ORAL | Status: DC

## 2019-05-13 MED ORDER — ROSUVASTATIN CALCIUM 40 MG PO TABS: 40.0000 mg | ORAL_TABLET | Freq: Every day | ORAL | Status: DC

## 2019-05-13 MED ORDER — IPRATROPIUM-ALBUTEROL 0.5-2.5 (3) MG/3ML IN SOLN
3.0000 mL | Freq: Four times a day (QID) | RESPIRATORY_TRACT | Status: DC
  Administered 2019-05-13 – 2019-05-16 (×12): 3 mL via RESPIRATORY_TRACT
  Filled 2019-05-13 (×10): qty 3

## 2019-05-13 MED ORDER — BISACODYL 10 MG RE SUPP: 10.0000 mg | Freq: Every day | RECTAL | Status: DC | PRN

## 2019-05-13 MED ORDER — CHLORHEXIDINE GLUCONATE 0.12 % MT SOLN
15.0000 mL | Freq: Two times a day (BID) | OROMUCOSAL | Status: DC
  Administered 2019-05-13 – 2019-06-09 (×48): 15 mL via OROMUCOSAL
  Filled 2019-05-13 (×46): qty 15

## 2019-05-13 MED ORDER — FLEET ENEMA 7-19 GM/118ML RE ENEM: 1.0000 | ENEMA | Freq: Once | RECTAL | Status: DC | PRN

## 2019-05-13 MED ORDER — TRAZODONE HCL 50 MG PO TABS: 25.0000 mg | ORAL_TABLET | Freq: Every evening | ORAL | Status: DC | PRN

## 2019-05-13 MED ORDER — OXYMETAZOLINE HCL 0.05 % NA SOLN: 1.0000 | Freq: Two times a day (BID) | NASAL | 0 refills | Status: DC

## 2019-05-13 MED ORDER — ROSUVASTATIN CALCIUM 20 MG PO TABS
40.0000 mg | ORAL_TABLET | Freq: Every day | ORAL | Status: DC
  Administered 2019-05-13: 40 mg

## 2019-05-13 MED ORDER — UMECLIDINIUM BROMIDE 62.5 MCG/INH IN AEPB
1.0000 | INHALATION_SPRAY | Freq: Every day | RESPIRATORY_TRACT | Status: DC
  Administered 2019-05-15 – 2019-06-09 (×24): 1 via RESPIRATORY_TRACT
  Filled 2019-05-13 (×5): qty 7

## 2019-05-13 MED ORDER — PANTOPRAZOLE SODIUM 40 MG IV SOLR: 40.0000 mg | Freq: Every day | INTRAVENOUS | Status: DC

## 2019-05-13 MED ORDER — POTASSIUM CHLORIDE 20 MEQ/15ML (10%) PO SOLN
40.0000 meq | Freq: Once | ORAL | Status: AC
  Administered 2019-05-13: 40 meq
  Filled 2019-05-13: qty 30

## 2019-05-13 MED ORDER — LEVOTHYROXINE SODIUM 50 MCG PO TABS
50.0000 ug | ORAL_TABLET | Freq: Every day | ORAL | Status: DC
  Administered 2019-05-14 – 2019-05-31 (×18): 50 ug via ORAL
  Filled 2019-05-13 (×18): qty 1

## 2019-05-13 MED ORDER — SODIUM CHLORIDE 0.9 % IV SOLN
3.0000 g | Freq: Four times a day (QID) | INTRAVENOUS | Status: AC
  Administered 2019-05-13 – 2019-05-19 (×24): 3 g via INTRAVENOUS
  Filled 2019-05-13 (×2): qty 8
  Filled 2019-05-13: qty 3
  Filled 2019-05-13: qty 8
  Filled 2019-05-13: qty 3
  Filled 2019-05-13: qty 8
  Filled 2019-05-13: qty 3
  Filled 2019-05-13: qty 8
  Filled 2019-05-13 (×7): qty 3
  Filled 2019-05-13 (×4): qty 8
  Filled 2019-05-13 (×3): qty 3
  Filled 2019-05-13: qty 8
  Filled 2019-05-13: qty 3

## 2019-05-13 MED ORDER — WARFARIN - PHARMACIST DOSING INPATIENT: Freq: Every day | Status: DC

## 2019-05-13 MED ORDER — ORAL CARE MOUTH RINSE
15.0000 mL | Freq: Two times a day (BID) | OROMUCOSAL | Status: DC
  Administered 2019-05-14 – 2019-06-08 (×46): 15 mL via OROMUCOSAL

## 2019-05-13 MED ORDER — DEXTROSE-NACL 5-0.45 % IV SOLN: INTRAVENOUS | Status: DC

## 2019-05-13 MED ORDER — SODIUM CHLORIDE 0.9 % IV SOLN: 50.0000 mL | INTRAVENOUS | 0 refills | Status: DC

## 2019-05-13 MED ORDER — LEVOTHYROXINE SODIUM 50 MCG PO TABS: 50.0000 ug | ORAL_TABLET | Freq: Every day | ORAL | Status: DC

## 2019-05-13 MED ORDER — ACETAMINOPHEN 325 MG PO TABS
325.0000 mg | ORAL_TABLET | ORAL | Status: DC | PRN
  Administered 2019-05-17 – 2019-05-18 (×2): 650 mg via ORAL
  Administered 2019-05-18: 325 mg via ORAL
  Administered 2019-05-24 – 2019-06-09 (×19): 650 mg via ORAL
  Filled 2019-05-13 (×22): qty 2

## 2019-05-13 MED ORDER — WARFARIN SODIUM 7.5 MG PO TABS
7.5000 mg | ORAL_TABLET | Freq: Once | ORAL | Status: AC
  Administered 2019-05-13: 7.5 mg via ORAL
  Filled 2019-05-13: qty 1

## 2019-05-13 MED ORDER — JEVITY 1.2 CAL PO LIQD: 1000.0000 mL | ORAL | 0 refills | Status: DC

## 2019-05-13 MED ORDER — CHLORHEXIDINE GLUCONATE 0.12 % MT SOLN: 15.0000 mL | Freq: Two times a day (BID) | OROMUCOSAL | 0 refills | Status: DC

## 2019-05-13 MED ORDER — GABAPENTIN 300 MG PO CAPS
300.0000 mg | ORAL_CAPSULE | Freq: Three times a day (TID) | ORAL | Status: DC
  Administered 2019-05-13 – 2019-06-04 (×64): 300 mg
  Filled 2019-05-13 (×65): qty 1

## 2019-05-13 MED ORDER — TOPIRAMATE 25 MG PO TABS
50.0000 mg | ORAL_TABLET | Freq: Every day | ORAL | Status: DC
  Administered 2019-05-14 – 2019-06-04 (×22): 50 mg
  Filled 2019-05-13 (×23): qty 2

## 2019-05-13 MED ORDER — DIPHENHYDRAMINE HCL 12.5 MG/5ML PO ELIX: 12.5000 mg | ORAL_SOLUTION | Freq: Four times a day (QID) | ORAL | Status: DC | PRN

## 2019-05-13 MED ORDER — HEPARIN (PORCINE) 25000 UT/250ML-% IV SOLN: 1500.0000 [IU]/h | INTRAVENOUS | Status: DC

## 2019-05-13 MED ORDER — PROCHLORPERAZINE MALEATE 5 MG PO TABS: 5.0000 mg | ORAL_TABLET | Freq: Four times a day (QID) | ORAL | Status: DC | PRN

## 2019-05-13 MED ORDER — GABAPENTIN 600 MG PO TABS: 300.0000 mg | ORAL_TABLET | Freq: Three times a day (TID) | ORAL | Status: DC

## 2019-05-13 MED ORDER — ROSUVASTATIN CALCIUM 20 MG PO TABS
40.0000 mg | ORAL_TABLET | Freq: Every day | ORAL | Status: DC
  Administered 2019-05-14 – 2019-06-04 (×22): 40 mg
  Filled 2019-05-13 (×22): qty 2

## 2019-05-13 MED ORDER — ASPIRIN 81 MG PO CHEW
81.0000 mg | CHEWABLE_TABLET | Freq: Every day | ORAL | Status: DC
  Administered 2019-05-14 – 2019-06-04 (×22): 81 mg
  Filled 2019-05-13 (×22): qty 1

## 2019-05-13 MED ORDER — POLYETHYLENE GLYCOL 3350 17 G PO PACK: 17.0000 g | PACK | Freq: Every day | ORAL | Status: DC | PRN

## 2019-05-13 MED ORDER — OXYMETAZOLINE HCL 0.05 % NA SOLN
1.0000 | Freq: Two times a day (BID) | NASAL | Status: DC
  Administered 2019-05-13 – 2019-06-01 (×12): 1 via NASAL
  Filled 2019-05-13: qty 30

## 2019-05-13 MED ORDER — SODIUM CHLORIDE 0.9 % IV SOLN: 3.0000 g | Freq: Four times a day (QID) | INTRAVENOUS | Status: DC

## 2019-05-13 MED ORDER — ASPIRIN 81 MG PO CHEW
81.0000 mg | CHEWABLE_TABLET | Freq: Every day | ORAL | Status: DC
  Administered 2019-05-13: 81 mg

## 2019-05-13 NOTE — Progress Notes (Signed)
ANTICOAGULATION CONSULT NOTE  Pharmacy Consult for Heparin / Coumadin Indication: stroke and mechanical valve  Allergies  Allergen Reactions  . Hydrocodone-Acetaminophen Other (See Comments)    PT MOTHER DOES NOT REMEMBER REACTION  Only use in Emergency   . Hydromorphone Itching and Rash       . Other Other (See Comments)    ALL NARCOTICS-ITCHING, RASH   . Oxycodone Other (See Comments)    Unknown reaction  . Penicillins Rash    Did it involve swelling of the face/tongue/throat, SOB, or low BP? Unknown Did it involve sudden or severe rash/hives, skin peeling, or any reaction on the inside of your mouth or nose? Unknown Did you need to seek medical attention at a hospital or doctor's office? Unknown When did it last happen?unknown If all above answers are "NO", may proceed with cephalosporin use.  . Dilaudid [Hydromorphone Hcl] Itching and Rash    Patient Measurements: Height: 5\' 11"  (180.3 cm) Weight: 233 lb 11 oz (106 kg) IBW/kg (Calculated) : 75.3 Heparin Dosing Weight: 97.7kg  Vital Signs: Temp: 98.2 F (36.8 C) (01/06 0400) Temp Source: Axillary (01/06 0400) BP: 123/77 (01/06 0600) Pulse Rate: 73 (01/06 0600)  Labs: Recent Labs    05/11/19 0135 05/12/19 0716 05/13/19 0330  HGB 11.1* 11.0* 11.4*  HCT 34.3* 34.0* 36.0*  PLT 294 292 263  LABPROT  --  15.6* 14.1  INR  --  1.3* 1.1  HEPARINUNFRC 0.37 0.35 0.21*  CREATININE  --  0.74 0.79    Estimated Creatinine Clearance: 132.3 mL/min (by C-G formula based on SCr of 0.79 mg/dL).   Assessment: 17 YOM presented to the ED with stroke symptoms. S/p recent discharge from [facility]. He is supposed to be on Coumadin and Lovenox for history of a mechanical valve.  Patient is now s/p thrombectomy and continues on IV heparin.  Pharmacy consulted to resume Coumadin on 05/10/18.  Heparin level trending down steadily and is sub-therapeutic this AM.  INR also trended down to 1.1.  No bleeding  reported.  Home Coumadin regimen: 5mg  alternating with 2.5mg  daily  Goal of Therapy:  Heparin level 0.3-0.5 units/ml  INR 2.5 - 3.5 Monitor platelets by anticoagulation protocol: Yes   Plan:  Increase heparin gtt to 1500 units/hr  Check 6 hr heparin level Coumadin 7.5mg  PO today Daily heparin level, CBC and PT / INR  Kunta Hilleary D. , PharmD, BCPS, BCCCP 05/13/2019, 7:12 AM

## 2019-05-13 NOTE — H&P (Signed)
Physical Medicine and Rehabilitation Admission H&P    Chief Complaint  Patient presents with  . 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  He presented on 05/08/2019 with sudden right-sided weakness and left gaze deviation.  History taken from chart review due to severe dysarthria.  CT head showed abnormal hypodensity left colossal body and possible pericallosal gyrus.  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  CTA chest showed diffuse lung opacity concerning for PNA and dissection or aneurysm .  He underwent cerebral angiogram with attempted thrombectomy of left A2 occlusion without significant improvement in flow and nonflow limiting dissection of distal cervical segment identified.  Coumadin was subtherapeutic at admission -- multiple attempts made to contact family for input and the reported patient had been recently discharged from hospital on Lovenox twice daily, however however mother had not been administering injections.  Echocardiogram showed ejection fraction of 25-30%.  He was placed on IV heparin and neurology felt the stroke was embolic in setting of subtherapeutic INR.  He was placed on dysphagia 1 honey liquid diet with chin tuck due to dysphagia with aspiration of nectars on 01/03.  Follow-up MRI showed multifocal restricted diffusion involving left frontoparietal lobes in both ACA and MCA territory as well as involvement of corpus callosum minor involvement of left occipital lobe question watershed and contralateral involvement of right anterolateral  thalamus and right centrum ovale. He has had issues with hypotension requiring levophed off and on.  He had progressive leukocytosis with increasing cough and chest x-ray performed on 05/12/2019 personally reviewed, suggestive of aspiration pneumonia.  He was made n.p.o. with cortak for nutritional support. Therapy ongoing and patient showing ability to follow simple commands with expressive aphasia, dysarthria and poor safety awareness. CIR recommended due to functional decline.  Please see preadmission assessment from earlier today as well.  Review of Systems  Unable to perform ROS: Other  Severe dysarthria   Past Medical History:  Diagnosis Date  . Aneurysm of thoracic aorta (HCC) 05/06/2012   IMO routine update IMO routine update  . Anxiety    sees Dr. Renae Fickle  . Aortic aneurysm and dissection (HCC) 05/06/2012  . Aortic stenosis    mechanical AVR 04/22/12  . Aortic valve disorder 05/06/2012  . 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 Bicuspid aortic valve   . Cerebral infarction (HCC) 05/11/2012  . Cerebral thrombosis with cerebral infarction (HCC) 07/14/2013  . Cholelithiasis 12/20/2016  . Current use of long term anticoagulation 12/20/2016  . CVA (cerebral infarction)    occipital CVA 12/13  . Dissecting aortic aneurysm (HCC) 05/06/2012  . Dysarthria 09/10/2017  . Dysrhythmia    sees Dr. Geronimo Running, [facility] cardiology in [place]  . Generalized constriction of visual field 10/15/2012   IMOUPDATE IMOUPDATE  . 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 GERD (gastroesophageal reflux disease)   . HCAP (healthcare-associated pneumonia) 05/06/2012  . Headache(784.0)    "due to vision problem"  . History of CVA (cerebrovascular accident) 05/07/2012  . Hypothyroidism   . Idiopathic peripheral neuropathy 07/14/2013  . Late effects of  CVA (cerebrovascular accident) 08/08/2015  . Nontraumatic multiple localized intracerebral hemorrhages (HCC) 10/16/2017    . Paroxysmal atrial fibrillation (HCC) 08/08/2015   On warfarin On warfarin  . Presence of prosthetic heart valve 08/08/2015  . Shortness of breath   . Stroke (HCC)    x3  . Stroke, embolic (HCC) 05/07/2012  . Syncope 05/06/2012  . Thoracic aortic aneurysm (HCC)    replacement aortic root 04/22/12  . VBI (vertebrobasilar insufficiency) 09/10/2017    Past Surgical History:  Procedure Laterality Date  . APPLICATION OF WOUND VAC  06/11/2012   Procedure: APPLICATION OF WOUND VAC;  Surgeon: [name], MD;  Location: [facility] OR;  Service: Vascular;  Laterality: Right;  . CARDIAC CATHETERIZATION    . CHOLECYSTECTOMY    . CORONARY ARTERY BYPASS GRAFT  04/22/2012   Procedure: CORONARY ARTERY BYPASS GRAFTING (CABG);  Surgeon: [name], MD;  Location: [facility] OR;  Service: Open Heart Surgery;  Laterality: N/A;  times one with right greater saphenous vein graft  . I & D EXTREMITY  06/11/2012   Procedure: IRRIGATION AND DEBRIDEMENT EXTREMITY;  Surgeon: [name], MD;  Location: [facility] OR;  Service: Vascular;  Laterality: Right;  . INTRAOPERATIVE TRANSESOPHAGEAL ECHOCARDIOGRAM  04/22/2012   Procedure: INTRAOPERATIVE TRANSESOPHAGEAL ECHOCARDIOGRAM;  Surgeon: [name], MD;  Location: [facility] OR;  Service: Open Heart Surgery;  Laterality: N/A;  . IR CT HEAD LTD  05/08/2019  . IR PERCUTANEOUS ART THROMBECTOMY/INFUSION INTRACRANIAL INC DIAG ANGIO  05/08/2019  . IR US GUIDE VASC ACCESS RIGHT  05/08/2019  . RADIOLOGY WITH ANESTHESIA N/A 05/08/2019   Procedure: IR WITH ANESTHESIA;  Surgeon: Radiologist, Medication, MD;  Location: [facility] OR;  Service: Radiology;  Laterality: N/A;  . ROTATOR CUFF REPAIR     12 years ago, Left  . THORACIC AORTIC ANEURYSM REPAIR  04/22/2012   Procedure: THORACIC ASCENDING ANEURYSM REPAIR (AAA);  Surgeon: [name], MD;  Location[facility] OR;  Service: Open Heart Surgery;  Laterality: N/A;    Family History  Problem Relation Age of Onset  . Lung disease Mother    . Diabetes Father   . Peripheral Artery Disease Father   . Hypertension Father   . CAD Father   . Heart disease Father   . Stroke Father   . Cancer Maternal Grandfather   . Heart disease Paternal Grandfather     Social History:  Lives with mother. Was independent with AD PTA. He  reports that he has been smoking cigarettes. He started smoking about 7 years ago. He has been smoking about 2.50 packs per day. He has never used smokeless tobacco. History of alcohol abuse. He reports that he does not use drugs.    Allergies  Allergen Reactions  . Hydrocodone-Acetaminophen Other (See Comments)    PT MOTHER DOES NOT REMEMBER REACTION  Only use in Emergency   . Hydromorphone Itching and Rash       . Other Other (See Comments)    ALL NARCOTICS-ITCHING, RASH   . Oxycodone Other (See Comments)    Unknown reaction  . Penicillins Rash    Did it involve swelling of the face/tongue/throat, SOB, or low BP? Unknown Did it involve sudden or severe rash/hives, skin peeling, or any reaction on the inside of your mouth or nose? Unknown Did you need to seek medical attention at a hospital or doctor's office? Unknown When did it last happen?unknown If all above answers are "NO", may proceed with cephalosporin use.  . Dilaudid [Hydromorphone Hcl]  Itching and Rash    Medications Prior to Admission  Medication Sig Dispense Refill  . acetaminophen (TYLENOL) 500 MG tablet Take 1,000 mg by mouth every 4 (four) hours as needed for headache (pain).     Marland Kitchen albuterol (VENTOLIN HFA) 108 (90 Base) MCG/ACT inhaler Inhale into the lungs every 4 (four) hours as needed for wheezing or shortness of breath.    Marland Kitchen amiodarone (PACERONE) 200 MG tablet Take 1 tablet (200 mg total) by mouth 3 (three) times daily. (Patient taking differently: Take 200 mg by mouth daily. ) 90 tablet 1  . dicyclomine (BENTYL) 20 MG tablet Take 20 mg by mouth every 6 (six) hours as needed (abdominal pain).    . DULoxetine (CYMBALTA) 30  MG capsule Take 30 mg by mouth at bedtime.    . enoxaparin (LOVENOX) 100 MG/ML injection Inject 100 mg into the skin 2 (two) times daily.    . furosemide (LASIX) 20 MG tablet Take 20 mg by mouth daily.     Marland Kitchen gabapentin (NEURONTIN) 300 MG capsule Take 300 mg by mouth 3 (three) times daily.     Marland Kitchen ipratropium-albuterol (DUONEB) 0.5-2.5 (3) MG/3ML SOLN Take 3 mLs by nebulization 4 (four) times daily.    Marland Kitchen levothyroxine (SYNTHROID, LEVOTHROID) 50 MCG tablet Take 50 mcg by mouth at bedtime.     Marland Kitchen omeprazole (PRILOSEC) 20 MG capsule Take 20 mg by mouth daily.     . rosuvastatin (CRESTOR) 40 MG tablet Take 40 mg by mouth at bedtime.   12  . spironolactone (ALDACTONE) 25 MG tablet Take 12.5 mg by mouth daily.    Marland Kitchen tiotropium (SPIRIVA HANDIHALER) 18 MCG inhalation capsule Place 18 mcg into inhaler and inhale daily at 2 PM.    . topiramate (TOPAMAX) 50 MG tablet Take 50 mg by mouth at bedtime.   5  . aspirin 81 MG chewable tablet Chew 1 tablet (81 mg total) by mouth daily. (Patient not taking: Reported on 05/08/2019)    . empagliflozin (JARDIANCE) 10 MG TABS tablet Take 10 mg by mouth daily.    . sacubitril-valsartan (ENTRESTO) 24-26 MG Take 1 tablet by mouth 2 (two) times daily.    Marland Kitchen warfarin (COUMADIN) 5 MG tablet TAKE 1 & 1/2 TO 2 TABLETS BY MOUTH DAILY AS DIRECTED BY COUMADIN CLINIC (Patient taking differently: Take 2.5-5 mg by mouth See admin instructions. Take one tablet (5 mg) by mouth 1st night, then take 1/2 tablet (2.5 mg) 2nd night, then repeat) 65 tablet 0    Drug Regimen Review  Drug regimen was reviewed and remains appropriate with no significant issues identified  Home: Home Living Family/patient expects to be discharged to:: Private residence Living Arrangements: Parent Available Help at Discharge: Family, Available 24 hours/day Type of Home: House Home Access: Stairs to enter [employer] of Steps: 3-5 (back), 3 front Home Layout: One level Bathroom Shower/Tub: Scientist, forensic: Standard Home Equipment: Civil engineer, contracting, Bedside commode, Environmental consultant - 2 wheels, Cane - single point, Wheelchair - manual Additional Comments: Info from pt s/p extubation   Functional History: Prior Function Level of Independence: Independent with assistive device(s) Comments: pt reports that he lives with his mother, but was independent with ADL and mobility with an AD usually  Functional Status:  Mobility: Bed Mobility Overal bed mobility: Needs Assistance Bed Mobility: Supine to Sit Rolling: Mod assist Sidelying to sit: Mod assist(via R side) Supine to sit: Min assist General bed mobility comments: pt bridged to EOB with cuing only, He  attempted to throw his legs off the bed to build momentum, but still needed minimal truncal assist. Transfers Overall transfer level: Needs assistance Equipment used: None Transfers: Sit to/from Stand Sit to Stand: Mod assist Stand pivot transfers: Mod assist, +2 physical assistance, +2 safety/equipment, From elevated surface General transfer comment: cues for appropriate hand placement, assist to come forward and up, guarding R LE. Ambulation/Gait Ambulation/Gait assistance: Mod assist, +2 physical assistance, +2 safety/equipment Gait Distance (Feet): 40 Feet Assistive device: None Gait Pattern/deviations: Step-through pattern, Step-to pattern, Decreased step length - left, Decreased stance time - right, Decreased step length - right, Decreased stride length General Gait Details: Use "three muskateer" technique (pt's arms over therapists shoulders) to ambulate  allowing more w/shift help and allowing pt to concentrate on stepping with the right. Gait velocity: slower Gait velocity interpretation: <1.8 ft/sec, indicate of risk for recurrent falls    ADL: ADL Overall ADL's : Needs assistance/impaired Eating/Feeding: Modified independent, Sitting Grooming: Min guard, Sitting Upper Body Bathing: Minimal assistance, Sitting Lower  Body Bathing: Moderate assistance, Cueing for safety, Sitting/lateral leans, Sit to/from stand Upper Body Dressing : Minimal assistance, Sitting Lower Body Dressing: Moderate assistance, Sitting/lateral leans, Sit to/from stand Toilet Transfer: Moderate assistance, +2 for physical assistance, +2 for safety/equipment, Cueing for safety, Cueing for sequencing, Ambulation Toileting- Clothing Manipulation and Hygiene: Moderate assistance, +2 for physical assistance, +2 for safety/equipment, Sitting/lateral lean, Sit to/from stand Functional mobility during ADLs: Minimal assistance, Moderate assistance, +2 for physical assistance, +2 for safety/equipment, Cueing for safety, Cueing for sequencing General ADL Comments: Pt limited by poor strength in R side, poor mobility and decreased activity tolerance  Cognition: Cognition Overall Cognitive Status: Difficult to assess Orientation Level: Oriented X4 Cognition Arousal/Alertness: Awake/alert Behavior During Therapy: Flat affect Overall Cognitive Status: Difficult to assess General Comments: pt with expressive aphasia answering yes/no questions appropriately and attempting to expand upon statements but difficult to understand Difficult to assess due to: Impaired communication   Blood pressure 120/81, pulse 78, temperature 99.3 F (37.4 C), temperature source Oral, resp. rate (!) 37, height 5\' 11"  (1.803 m), weight 106 kg, SpO2 (!) 89 %. Physical Exam  Nursing note and vitals reviewed. Constitutional: He appears well-developed.  Obese.  HENT:  Head: Normocephalic and atraumatic.  Mouth/Throat: Mucous membranes are dry. Oropharyngeal exudate present.  + NG  Eyes: EOM are normal. Right eye exhibits no discharge. Left eye exhibits no discharge.  Neck: No tracheal deviation present. No thyromegaly present.  Cardiovascular: Normal rate and regular rhythm.  Respiratory: No stridor. He has rales in the right lower field, the left middle field and the  left lower field.  Increased work of breathing Rhonchi throughout + [place]  GI: Soft. Bowel sounds are normal. He exhibits no distension. There is no abdominal tenderness.  Musculoskeletal:        General: No tenderness or edema.     Comments: No edema or tenderness in extremities. Healing abrasions bilateral knees.   Neurological: He is alert. A cranial nerve deficit is present.  Wet congested sounds with inability to clear secretions. Severe dysarthria with garbled sounds. He is able to follow simple motor commands.  Motor: LUE/LLE: 5/5 proximal distal RUE: 3/5 proximal distal RLE: Flexion, knee extension 2/5, dorsiflexion 0/5  Skin: Skin is warm and dry.  Psychiatric:  Unable to assess due to dysarthria    Results for orders placed or performed during the hospital encounter of 05/08/19 (from the past 48 hour(s))  Glucose, capillary     Status:  None   Collection Time: 05/11/19 11:42 AM  Result Value Ref Range   Glucose-Capillary 90 70 - 99 mg/dL   Comment 1 Notify RN    Comment 2 Document in Chart   Glucose, capillary     Status: None   Collection Time: 05/11/19  3:55 PM  Result Value Ref Range   Glucose-Capillary 90 70 - 99 mg/dL   Comment 1 Notify RN    Comment 2 Document in Chart   Glucose, capillary     Status: Abnormal   Collection Time: 05/11/19  7:47 PM  Result Value Ref Range   Glucose-Capillary 113 (H) 70 - 99 mg/dL  Glucose, capillary     Status: Abnormal   Collection Time: 05/11/19 11:41 PM  Result Value Ref Range   Glucose-Capillary 181 (H) 70 - 99 mg/dL  Glucose, capillary     Status: None   Collection Time: 05/12/19  3:30 AM  Result Value Ref Range   Glucose-Capillary 70 70 - 99 mg/dL  Heparin level (unfractionated)     Status: None   Collection Time: 05/12/19  7:16 AM  Result Value Ref Range   Heparin Unfractionated 0.35 0.30 - 0.70 IU/mL    Comment: (NOTE) If heparin results are below expected values, and patient dosage has  been confirmed, suggest  follow up testing of antithrombin III levels. Performed at [facility], [address], [place], [place] [address]   CBC with Differential     Status: Abnormal   Collection Time: 05/12/19  7:16 AM  Result Value Ref Range   WBC 19.8 (H) 4.0 - 10.5 K/uL   RBC 3.81 (L) 4.22 - 5.81 MIL/uL   Hemoglobin 11.0 (L) 13.0 - 17.0 g/dL   HCT 72.5 (L) 36.6 - 44.0 %   MCV 89.2 80.0 - 100.0 fL   MCH 28.9 26.0 - 34.0 pg   MCHC 32.4 30.0 - 36.0 g/dL   RDW 34.7 (H) 42.5 - 95.6 %   Platelets 292 150 - 400 K/uL   nRBC 0.0 0.0 - 0.2 %   Neutrophils Relative % 85 %   Neutro Abs 16.9 (H) 1.7 - 7.7 K/uL   Lymphocytes Relative 5 %   Lymphs Abs 0.9 0.7 - 4.0 K/uL   Monocytes Relative 8 %   Monocytes Absolute 1.6 (H) 0.1 - 1.0 K/uL   Eosinophils Relative 1 %   Eosinophils Absolute 0.2 0.0 - 0.5 K/uL   Basophils Relative 0 %   Basophils Absolute 0.0 0.0 - 0.1 K/uL   Immature Granulocytes 1 %   Abs Immature Granulocytes 0.18 (H) 0.00 - 0.07 K/uL    Comment: Performed at [facility], [address], [place], [place] [address]  Magnesium     Status: None   Collection Time: 05/12/19  7:16 AM  Result Value Ref Range   Magnesium 1.8 1.7 - 2.4 mg/dL    Comment: Performed at [facility], [address], [place], [place] [address]  Phosphorus     Status: None   Collection Time: 05/12/19  7:16 AM  Result Value Ref Range   Phosphorus 4.0 2.5 - 4.6 mg/dL    Comment: Performed at [facility], [address], [place], [place] [address]  Protime-INR     Status: Abnormal   Collection Time: 05/12/19  7:16 AM  Result Value Ref Range   Prothrombin Time 15.6 (H) 11.4 - 15.2 seconds   INR 1.3 (H) 0.8 - 1.2  Comment: (NOTE) INR goal varies based on device and disease states. Performed at [facility], [address], [place], [place] [address]   Basic metabolic panel     Status: Abnormal   Collection Time: 05/12/19  7:16 AM  Result Value Ref Range   Sodium 138 135 - 145 mmol/L     Potassium 3.5 3.5 - 5.1 mmol/L   Chloride 104 98 - 111 mmol/L   CO2 23 22 - 32 mmol/L   Glucose, Bld 92 70 - 99 mg/dL   BUN 6 6 - 20 mg/dL   Creatinine, Ser 7.65 0.61 - 1.24 mg/dL   Calcium 8.6 (L) 8.9 - 10.3 mg/dL   GFR calc non Af Amer >60 >60 mL/min   GFR calc Af Amer >60 >60 mL/min   Anion gap 11 5 - 15    Comment: Performed at [facility], [address], [place], [place] [address]  Glucose, capillary     Status: None   Collection Time: 05/12/19  8:02 AM  Result Value Ref Range   Glucose-Capillary 88 70 - 99 mg/dL   Comment 1 Notify RN    Comment 2 Document in Chart   Glucose, capillary     Status: None   Collection Time: 05/12/19 12:22 PM  Result Value Ref Range   Glucose-Capillary 89 70 - 99 mg/dL   Comment 1 Notify RN    Comment 2 Document in Chart   Glucose, capillary     Status: None   Collection Time: 05/12/19  3:44 PM  Result Value Ref Range   Glucose-Capillary 94 70 - 99 mg/dL   Comment 1 Notify RN    Comment 2 Document in Chart   Glucose, capillary     Status: None   Collection Time: 05/12/19  7:34 PM  Result Value Ref Range   Glucose-Capillary 91 70 - 99 mg/dL  Glucose, capillary     Status: Abnormal   Collection Time: 05/12/19 11:23 PM  Result Value Ref Range   Glucose-Capillary 107 (H) 70 - 99 mg/dL  Heparin level (unfractionated)     Status: Abnormal   Collection Time: 05/13/19  3:30 AM  Result Value Ref Range   Heparin Unfractionated 0.21 (L) 0.30 - 0.70 IU/mL    Comment: (NOTE) If heparin results are below expected values, and patient dosage has  been confirmed, suggest follow up testing of antithrombin III levels. Performed at [facility], [address] [place], [place] [address]   CBC     Status: Abnormal   Collection Time: 05/13/19  3:30 AM  Result Value Ref Range   WBC 17.8 (H) 4.0 - 10.5 K/uL   RBC 4.00 (L) 4.22 - 5.81 MIL/uL   Hemoglobin 11.4 (L) 13.0 - 17.0 g/dL   HCT 81.2 (L) 75.1 - 70.0 %   MCV 90.0 80.0 - 100.0 fL    MCH 28.5 26.0 - 34.0 pg   MCHC 31.7 30.0 - 36.0 g/dL   RDW 17.4 (H) 94.4 - 96.7 %   Platelets 263 150 - 400 K/uL   nRBC 0.0 0.0 - 0.2 %    Comment: Performed at [facility], [address], [place], [place] [address]  Protime-INR     Status: None   Collection Time: 05/13/19  3:30 AM  Result Value Ref Range   Prothrombin Time 14.1 11.4 - 15.2 seconds   INR 1.1 0.8 - 1.2    Comment: (NOTE) INR goal varies based on  device and disease states. Performed at [facility], [address] [place], [place] [address]   Basic metabolic panel     Status: Abnormal   Collection Time: 05/13/19  3:30 AM  Result Value Ref Range   Sodium 138 135 - 145 mmol/L   Potassium 3.4 (L) 3.5 - 5.1 mmol/L   Chloride 103 98 - 111 mmol/L   CO2 25 22 - 32 mmol/L   Glucose, Bld 132 (H) 70 - 99 mg/dL   BUN 7 6 - 20 mg/dL   Creatinine, Ser 1.51 0.61 - 1.24 mg/dL   Calcium 8.5 (L) 8.9 - 10.3 mg/dL   GFR calc non Af Amer >60 >60 mL/min   GFR calc Af Amer >60 >60 mL/min   Anion gap 10 5 - 15    Comment: Performed at [facility], [address], [place], [place] [address]  Glucose, capillary     Status: Abnormal   Collection Time: 05/13/19  3:47 AM  Result Value Ref Range   Glucose-Capillary 137 (H) 70 - 99 mg/dL  Glucose, capillary     Status: Abnormal   Collection Time: 05/13/19  7:50 AM  Result Value Ref Range   Glucose-Capillary 141 (H) 70 - 99 mg/dL   Comment 1 Notify RN    Comment 2 Document in Chart    DG CHEST PORT 1 VIEW  Result Date: 05/12/2019 CLINICAL DATA:  Aspiration into airway. EXAM: PORTABLE CHEST 1 VIEW COMPARISON:  Chest radiograph 05/09/2019 FINDINGS: Unchanged cardiomegaly. Aortic atherosclerosis. Redemonstrated left chest dual lead pacer. Prior median sternotomy with cardiac valve prosthesis. Shallow inspiration radiograph. Similar appearance of interstitial and ill-defined opacity throughout the left lung. No sizable pleural effusion or evidence of pneumothorax. No  acute bony abnormality. IMPRESSION: Shallow inspiration radiograph. Similar appearance of interstitial and ill-defined opacities throughout the left lung. Findings are suspicious for pneumonia given provided history. Asymmetric edema is also a consideration. Electronically Signed   By: Jackey Loge DO   On: 05/12/2019 10:59       Medical Problem List and Plan: 1.  Expressive aphasia, dysarthria, poor safety awareness, limitations in self-care, dysphagia secondary to bilateral CVA, left greater than right.  -patient may may shower  -ELOS/Goals: 18-22 days/min A  Admit to CIR 2.  Antithrombotics: -DVT/anticoagulation:  Pharmaceutical:Heparin GGT bridge to Coumadin.  -antiplatelet therapy: on ASA 3. Pain Management: tylenol prn 4. Mood: LCSW to follow for evaluation and support when appropriate.   -antipsychotic agents: N/A 5. Neuropsych: This patient is not fully capable of making decisions on his own behalf. 6. Skin/Wound Care: Routine pressure relief measures.  7. Fluids/Electrolytes/Nutrition: NPO. Continue tube feeds--add water flushes. Discontinue IVF.  CMP ordered for tomorrow a.m. 8. Aspiration PNA: On Unasyn day #2/7 with downward trend in leucocytosis. Needs to be upright with pulmonary toilet.  Wean supplemental oxygen as tolerated  9. CAF/CHB s/p PPM:   Monitor HR tid--off amiodarone and Entresto due to issues with hypotension.  11 COPD/OSA: Duonebs and incruse resumed today. Encourage IS and keep HOB>30 degrees.  12. Depression/anxiety disorder: Ego support 19. H/o migraines: Has been managed on Topamax and gabapentin.  14. Hypokalemia: Add Kdur for supplement due to hypokalemia.  BMP ordered for tomorrow a.m.   Jacquelynn Cree, PA-C 05/13/2019  I have personally performed a face to face diagnostic evaluation, including, but not limited to relevant history and physical exam findings, of this patient and developed relevant assessment and plan.  Additionally, I have reviewed and  concur with the physician assistant's documentation above.  Maryla MorrowAnkit Jaysun Wessels, MD, ABPMR  The patient's status has not changed. The original post admission physician evaluation remains appropriate, and any changes from the pre-admission screening or documentation from the acute chart are noted above.   Maryla MorrowAnkit Enedelia Martorelli, MD, ABPMR

## 2019-05-13 NOTE — Progress Notes (Signed)
ANTICOAGULATION CONSULT NOTE  Pharmacy Consult for Heparin / Coumadin Indication: stroke and mechanical valve, atrial fibrillation  Allergies  Allergen Reactions  . Hydrocodone-Acetaminophen Other (See Comments)    PT MOTHER DOES NOT REMEMBER REACTION  Only use in Emergency   . Hydromorphone Itching and Rash       . Other Other (See Comments)    ALL NARCOTICS-ITCHING, RASH   . Oxycodone Other (See Comments)    Unknown reaction  . Penicillins Rash    Did it involve swelling of the face/tongue/throat, SOB, or low BP? Unknown Did it involve sudden or severe rash/hives, skin peeling, or any reaction on the inside of your mouth or nose? Unknown Did you need to seek medical attention at a hospital or doctor's office? Unknown When did it last happen?unknown If all above answers are "NO", may proceed with cephalosporin use.  . Dilaudid [Hydromorphone Hcl] Itching and Rash    Patient Measurements: Total Body Weight: 106 kg Height: 71 inches Heparin Dosing Weight: 97.7kg  Vital Signs: Temp: 98.1 F (36.7 C) (01/06 1740) Temp Source: Axillary (01/06 1200) BP: 117/64 (01/06 1740) Pulse Rate: 62 (01/06 1740)  Labs: Recent Labs    05/11/19 0135 05/12/19 0716 05/13/19 0330 05/13/19 1634 05/13/19 1755  HGB 11.1* 11.0* 11.4*  --   --   HCT 34.3* 34.0* 36.0*  --   --   PLT 294 292 263  --   --   LABPROT  --  15.6* 14.1  --   --   INR  --  1.3* 1.1  --   --   HEPARINUNFRC 0.37 0.35 0.21* 0.24* 0.26*  CREATININE  --  0.74 0.79  --   --     Estimated Creatinine Clearance: 132.3 mL/min (by C-G formula based on SCr of 0.79 mg/dL).   Assessment: 54 yr old male presented to the ED with stroke symptoms after  recent discharge from [facility]. He is supposed to be on warfarin and Lovenox for history of a mechanical valve.  Patient is S/P thrombectomy and continues on IV heparin.  Pharmacy was consulted to resume warfarin on 05/10/18.  Home warfarin regimen: 5 mg  alternating with 2.5 mg daily  Heparin level on heparin infusion at 1500 units/hr is 0.26 units/ml, which is slightly below the goal range for this patient. H/H 11.4/36.0, platelets 263 (stable), INR 1.1. Per RN, no issues with IV or bleeding observed.   Goal of Therapy:  Heparin level 0.3-0.5 units/ml  INR 2.5 - 3.5 Monitor platelets by anticoagulation protocol: Yes   Plan:  Increase heparin infusion to 1600 units/hr  Check 6-hr heparin level Coumadin 7.5 mg PO X 1 today Monitor daily heparin level, CBC, INR Monitor for signs/symptoms of bleeding  Diane Elfriede Bonini,PharmD, BCPS, FCCP Clinical Pharmacist 05/13/2019, 7:06 PM

## 2019-05-13 NOTE — Progress Notes (Signed)
Marcello FennelPatel, Ankit Anil, MD  Physician  Physical Medicine and Rehabilitation  PMR Pre-admission  Signed  Date of Service:  05/12/2019  2:29 PM      Related encounter: ED to Hosp-Admission (Current) from 05/08/2019 in [facility]AU[facility]/SURGICAL ICU      Signed        PMR Admission Coordinator Pre-Admission Assessment   Patient: Jon [name] is an 54 y.o., male MRN: [id number] DOB: [birth year] Height: 5\' 11"  (180.3 cm) Weight: 106 kg                                                                                                                                                  Insurance Information HMO:     PPO:      PCP:      IPA:      80/20:      OTHER:  PRIMARY: Medicaid [place]      Policy#: [id number]      Subscriber: Patient CM Name:       Phone#:      Fax#:  Pre-Cert#:       Employer:  Benefits:  Phone #: [phone]     Name: verified eligibility online via [employer] on 05/13/2019 Coverage Code: MADCY Eff. Date: verified on 05/13/19     Deduct:       Out of Pocket Max:       Life Max:  CIR: Covered per Medicaid guidelines      SNF:  Outpatient:      Co-Pay:  Home Health:       Co-Pay:  DME:      Co-Pay:  Providers:  SECONDARY: None      Policy#:       Subscriber:  CM Name:       Phone#:      Fax#:  Pre-Cert#:       Employer:  Benefits:  Phone #:     Name:  Eff. Date:      Deduct:       Out of Pocket Max:       Life Max:  CIR:       SNF:  Outpatient:      Co-Pay:  Home Health:       Co-Pay: DME:      Co-Pay:    Medicaid Application Date:       Case Manager:  Disability Application Date:       Case Worker:    The "Data Collection Information Summary" for patients in Inpatient Rehabilitation Facilities with attached "Privacy Act Statement-Health Care Records" was provided and verbally reviewed with: N/A   Emergency Contact Information         Contact Information     Name Relation Home Work [place]    [place], [place] Mother [phone]   [phone]    Jeannett SeniorHenderson,  Amanda Daughter  [phone]       Current Medical History  Patient Admitting Diagnosis: 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 He was admitted on 05/08/19 with sudden onset of leftward gaze and right flaccid paralysis.  CT head showed abnormal hypodensity left colossal body and possible pericallosal gyrus.  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  CTA chest showed diffuse lung opacity concerning for PNA and dissection or aneurysm .  He underwent cerebral angio with attempted thrombectomy of left A2 occlusion without significant improvement in flow and nonflow limiting dissection of distal cervical segment identified.  Coumadin is subtherapeutic at admission -- multiple attempts made to contact family for input and the reported patient had been recently discharged on hospital on Lovenox twice daily however mother had not been administering injections.  2D echo showed EF of 25 to 30% with no S3.  He was placed on IV heparin and neurology felt the stroke was embolic in setting of subtherapeutic INR.  He was placed on dysphagia 1 diet with honey liquids with chin tuck due to dysphagia with aspiration of nectars on 01/03.   MRI brain done revealing multifocal restricted diffusion involving left frontoparietal lobes in both ACA and MCA territory as well as involvement of corpus callosum minor involvement of left occipital lobe question watershed and contralateral involvement of right anterolateral thalamus and right centrum ovale. He has had issues with hypotension requiring levophed off  and on.  He had progressive leukocytosis with increasing cough and chest x-ray1/05 done revealing aspiration pneumonia.  He was made n.p.o. and cortak placed for nutritional support. Therapy ongoing and patient showing ability to follow simple commands with expressive aphasia, dysarthria and poor safety awareness. CIR recommended due to functional decline. Pt is to admit to CIR on 05/13/19.    Complete NIHSS TOTAL: 8 Glasgow Coma Scale Score: 12   Past Medical History      Past Medical History:  Diagnosis Date  . Aneurysm of thoracic aorta (HCC) 05/06/2012    IMO routine update IMO routine update  . Anxiety      sees Dr. Renae FickleJohn Gage  . Aortic aneurysm and dissection (HCC) 05/06/2012  . Aortic stenosis      mechanical AVR 04/22/12  . Aortic valve disorder 05/06/2012  . 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 Bicuspid aortic valve    . Cerebral infarction (HCC) 05/11/2012  . Cerebral thrombosis with cerebral infarction (HCC) 07/14/2013  . Cholelithiasis 12/20/2016  . Current use of long term anticoagulation 12/20/2016  . CVA (cerebral infarction)      occipital CVA 12/13  . Dissecting aortic aneurysm (HCC) 05/06/2012  . Dysarthria 09/10/2017  . Dysrhythmia      [facility] Geronimo [facility]myer[facility] [place]  . Generalized constriction of visual field 10/15/2012    IMOUPDATE IMOUPDATE  . 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 GERD (gastroesophageal reflux disease)    . HCAP (healthcare-associated pneumonia) 05/06/2012  . Headache(784.0)      "due to vision problem"  . History of CVA (cerebrovascular accident) 05/07/2012  . Hypothyroidism    . Idiopathic peripheral  neuropathy 07/14/2013  . Late effects of CVA (cerebrovascular accident) 08/08/2015  . Migraine    . Nontraumatic multiple localized intracerebral hemorrhages (HCC) 10/16/2017  . Paroxysmal atrial fibrillation (HCC) 08/08/2015    On warfarin On warfarin  . Presence of prosthetic heart  valve 08/08/2015  . Shortness of breath    . Stroke (HCC)      x3  . Stroke, embolic (HCC) 05/07/2012  . Syncope 05/06/2012  . Thoracic aortic aneurysm (HCC)      replacement aortic root 04/22/12  . VBI (vertebrobasilar insufficiency) 09/10/2017      Family History  family history includes CAD in his father; Cancer in his maternal grandfather; Diabetes in his father; Heart disease in his father and paternal grandfather; Hypertension in his father; Lung disease in his mother; Peripheral Artery Disease in his father; Stroke in his father.   Prior Rehab/Hospitalizations:  Has the patient had prior rehab or hospitalizations prior to admission? Yes   Has the patient had major surgery during 100 days prior to admission? Yes   Current Medications    Current Facility-Administered Medications:  .  0.9 %  sodium chloride infusion, , Intravenous, Continuous, [name], MD, Stopped at 05/12/19 1458 .  acetaminophen (TYLENOL) tablet 650 mg, 650 mg, Oral, Q4H PRN **OR** acetaminophen (TYLENOL) 160 MG/5ML solution 650 mg, 650 mg, Per Tube, Q4H PRN **OR** acetaminophen (TYLENOL) suppository 650 mg, 650 mg, Rectal, Q4H PRN, Milon Dikes, MD .  Ampicillin-Sulbactam (UNASYN) 3 g in sodium chloride 0.9 % 100 mL IVPB, 3 g, Intravenous, Q6H, Biby, Sharon L, NP, Last Rate: 200 mL/hr at 05/13/19 0916, 3 g at 05/13/19 0916 .  aspirin chewable tablet 81 mg, 81 mg, Per Tube, Daily, [name], MD, 81 mg at 05/13/19 0918 .  chlorhexidine (PERIDEX) 0.12 % solution 15 mL, 15 mL, Mouth Rinse, BID, Naomie Dean B, MD, 15 mL at 05/13/19 0923 .  Chlorhexidine Gluconate Cloth 2 % PADS 6 each, 6 each, Topical, Daily, Kalman Shan, MD, 6 each at 05/13/19 1055 .  feeding supplement (JEVITY 1.2 CAL) liquid 1,000 mL, 1,000 mL, Per Tube, Continuous, [name], MD, Last Rate: 50 mL/hr at 05/12/19 2146, 1,000 mL at 05/12/19 2146 .  fentaNYL (SUBLIMAZE) injection 25-100 mcg, 25-100 mcg, Intravenous, Q2H PRN,  Kalman Shan, MD, 50 mcg at 05/08/19 2226 .  gabapentin (NEURONTIN) tablet 300 mg, 300 mg, Per Tube, TID, Mosetta Anis, RPH, 300 mg at 05/13/19 5621 .  heparin ADULT infusion 100 units/mL (25000 units/286mL sodium chloride 0.45%), 1,500 Units/hr, Intravenous, Continuous, Dang, Thuy D, RPH, Last Rate: 15 mL/hr at 05/13/19 0900, 1,500 Units/hr at 05/13/19 0900 .  insulin aspart (novoLOG) injection 2-6 Units, 2-6 Units, Subcutaneous, Q4H, Kalman Shan, MD, 2 Units at 05/13/19 1150 .  levothyroxine (SYNTHROID) tablet 50 mcg, 50 mcg, Oral, Q0600, [name], MD, 50 mcg at 05/13/19 0514 .  MEDLINE mouth rinse, 15 mL, Mouth Rinse, q12n4p, Naomie Dean B, MD, 15 mL at 05/13/19 1150 .  midazolam (VERSED) injection 1-4 mg, 1-4 mg, Intravenous, Q2H PRN, Kalman Shan, MD, 2 mg at 05/09/19 0237 .  oxymetazoline (AFRIN) 0.05 % nasal spray 1 spray, 1 spray, Each Nare, BID, Gilmer Mor, DO, 1 spray at 05/13/19 1056 .  pantoprazole (PROTONIX) injection 40 mg, 40 mg, Intravenous, QHS, [name], MD, 40 mg at 05/12/19 2149 .  rosuvastatin (CRESTOR) tablet 40 mg, 40 mg, Per Tube, Daily, [name], MD, 40 mg at 05/13/19 3086 .  senna-docusate (  Senokot-S) tablet 1 tablet, 1 tablet, Per Tube, QHS PRN, [name], MD .  topiramate (TOPAMAX) tablet 50 mg, 50 mg, Per Tube, Daily, [name], MD, 50 mg at 05/13/19 7062 .  warfarin (COUMADIN) tablet 7.5 mg, 7.5 mg, Oral, ONCE-1800, Dang, Thuy D, RPH .  Warfarin - Pharmacist Dosing Inpatient, , Does not apply, q1800, Gerilyn Nestle [facility], Given at 05/12/19 1824   Patients Current Diet:     Diet Order                      Diet NPO time specified  Diet effective now                   Precautions / Restrictions Precautions Precautions: Fall Precaution Comments: SBP 140-180 Restrictions Weight Bearing Restrictions: No    Has the patient had 2 or more falls or a fall with injury in the past year?Yes   Prior Activity  Level Community (5-7x/wk): would get out approx 3x/week. did not work or drive PTA. Independent without an AD PTA   Prior Functional Level Prior Function Level of Independence: Independent with assistive device(s) Comments: pt reports that he lives with his mother, but was independent with ADL and mobility with an AD usually   Self Care: Did the patient need help bathing, dressing, using the toilet or eating?  Independent   Indoor Mobility: Did the patient need assistance with walking from room to room (with or without device)? Independent   Stairs: Did the patient need assistance with internal or external stairs (with or without device)? Independent   Functional Cognition: Did the patient need help planning regular tasks such as shopping or remembering to take medications? Independent   Home Banker / Equipment Home Equipment: Shower seat, Bedside commode, Environmental consultant - 2 wheels, Cane - single point, Wheelchair - manual   Prior Device Use: Indicate devices/aids used by the patient prior to current illness, exacerbation or injury? None of the above   Current Functional Level Cognition   Overall Cognitive Status: Difficult to assess Difficult to assess due to: Impaired communication Orientation Level: Oriented X4 General Comments: pt with expressive aphasia answering yes/no questions appropriately and attempting to expand upon statements but difficult to understand    Extremity Assessment (includes Sensation/Coordination)   Upper Extremity Assessment: Defer to OT evaluation RUE Deficits / Details: Shoulder 3+/5 MM grade, elbow through hand with 4/5 MM grade, AROM, WFLs. Pt with decreased grip strength RUE Sensation: (inconsistent with sensation on R side) RUE Coordination: decreased fine motor, decreased gross motor  Lower Extremity Assessment: RLE deficits/detail RLE Deficits / Details: hip flexion 4/5, knee flexion and extension 5/5.     ADLs   Overall ADL's : Needs  assistance/impaired Eating/Feeding: Modified independent, Sitting Grooming: Min guard, Sitting Upper Body Bathing: Minimal assistance, Sitting Lower Body Bathing: Moderate assistance, Cueing for safety, Sitting/lateral leans, Sit to/from stand Upper Body Dressing : Minimal assistance, Sitting Lower Body Dressing: Moderate assistance, Sitting/lateral leans, Sit to/from stand Toilet Transfer: Moderate assistance, +2 for physical assistance, +2 for safety/equipment, Cueing for safety, Cueing for sequencing, Ambulation Toileting- Clothing Manipulation and Hygiene: Moderate assistance, +2 for physical assistance, +2 for safety/equipment, Sitting/lateral lean, Sit to/from stand Functional mobility during ADLs: Minimal assistance, Moderate assistance, +2 for physical assistance, +2 for safety/equipment, Cueing for safety, Cueing for sequencing General ADL Comments: Pt limited by poor strength in R side, poor mobility and decreased activity tolerance     Mobility  Overal bed mobility: Needs Assistance Bed Mobility: Supine to Sit Rolling: Mod assist Sidelying to sit: Mod assist(via R side) Supine to sit: Min assist General bed mobility comments: pt bridged to EOB with cuing only, He attempted to throw his legs off the bed to build momentum, but still needed minimal truncal assist.     Transfers   Overall transfer level: Needs assistance Equipment used: None Transfers: Sit to/from Stand Sit to Stand: Mod assist Stand pivot transfers: Mod assist, +2 physical assistance, +2 safety/equipment, From elevated surface General transfer comment: cues for appropriate hand placement, assist to come forward and up, guarding R LE.     Ambulation / Gait / Stairs / Wheelchair Mobility   Ambulation/Gait Ambulation/Gait assistance: Mod assist, +2 physical assistance, +2 safety/equipment Gait Distance (Feet): 40 Feet Assistive device: None Gait Pattern/deviations: Step-through pattern, Step-to pattern,  Decreased step length - left, Decreased stance time - right, Decreased step length - right, Decreased stride length General Gait Details: Use "three muskateer" technique (pt's arms over therapists shoulders) to ambulate  allowing more w/shift help and allowing pt to concentrate on stepping with the right. Gait velocity: slower Gait velocity interpretation: <1.8 ft/sec, indicate of risk for recurrent falls     Posture / Balance Dynamic Sitting Balance Sitting balance - Comments: able to sit without UE support Balance Overall balance assessment: Needs assistance Sitting-balance support: Single extremity supported, No upper extremity supported Sitting balance-Leahy Scale: Fair Sitting balance - Comments: able to sit without UE support Standing balance support: Bilateral upper extremity supported, Single extremity supported Standing balance-Leahy Scale: Poor Standing balance comment: pt reached for and held to the footboard and then could w/bear on both LE's with a bias toward the R.     Special needs/care consideration BiPAP/CPAP: no CPM: no Continuous Drip IV: 0.9% sodium chloride infusion, feeding supplement, heparin ADULT infusion  Dialysis: no        Days: no Life Vest: no Oxygen: 3L/min Special Bed: no Trach Size: no Wound Vac (area): no      Location: no Skin: ecchymosis to flank, arm, hand (left; right), incision to anterior, proximal, right groin                    Bowel mgmt: no BM charted.  Bladder mgmt: external catheter in place Diabetic mgmt: no Behavioral consideration : appears frustrated with aphasia  Chemo/radiation : no        Previous Home Environment (from acute therapy documentation) Living Arrangements: Parent Available Help at Discharge: Family, Available 24 hours/day Type of Home: House Home Layout: One level Home Access: Stairs to enter [employer] of Steps: 3-5 (back), 3 front Bathroom Shower/Tub: Engineer, manufacturing systems:  Standard Additional Comments: Info from pt s/p extubation   Discharge Living Setting Plans for Discharge Living Setting: Other (Comment)(plans to stay at his sister-in-laws house (brother's house)) Type of Home at Discharge: House Discharge Home Layout: One level Discharge Home Access: Stairs to enter Entrance Stairs-Rails: None Entrance Stairs-Number of Steps: 2 Discharge Bathroom Shower/Tub: Tub/shower unit Discharge Bathroom Toilet: Standard Discharge Bathroom Accessibility: Yes How Accessible: Accessible via walker Does the patient have any problems obtaining your medications?: No   Social/Family/Support Systems Patient Roles: Other (Comment)(has good family support; [place]) Solicitor Information: sister in law: Boyd Kerbs [phone]); Lura Em (mother): [phone] Anticipated Caregiver: Boyd Kerbs (main caregiver as she is home 24/7), his brother, and his mom Patsy as needed Anticipated Caregiver's Contact Information: see above Ability/Limitations of Caregiver:  Min A Caregiver Availability: 24/7 Discharge Plan Discussed with Primary Caregiver: Yes(pt, Patsy, and Penny) Is Caregiver In Agreement with Plan?: Yes Does Caregiver/Family have Issues with Lodging/Transportation while Pt is in Rehab?: No     Goals/Additional Needs Patient/Family Goal for Rehab: PT/OT: Supervision to Min A; SLP: Supervision to Min A Expected length of stay: 21-25 days Cultural Considerations: NA Dietary Needs: NPO  Equipment Needs: TBD Pt/Family Agrees to Admission and willing to participate: Yes Program Orientation Provided & Reviewed with Pt/Caregiver Including Roles  & Responsibilities: Yes(pt, Patsy, and Kieth Brightly)  Barriers to Discharge: Home environment access/layout, Insurance for SNF coverage, Nutrition means  Barriers to Discharge Comments: [place], steps to enter home, will need to be a one person Min A to return home safely     Decrease burden of Care through IP rehab  admission: NA     Possible need for SNF placement upon discharge:Not anticipated. Pt has good social support at DC from his mother and his sister-in-law. Plan is for pt to return home at Supervision/Min A level which is a level his sister-in-law is able to provide as she works from home and can provide light physical assist 24/7. Pt and family are aware the plan is for pt to DC to Penny's house after CIR stay.      Patient Condition: This patient's medical and functional status has changed since the consult dated: 05/11/19 in which the Rehabilitation Physician determined and documented that the patient's condition is appropriate for intensive rehabilitative care in an inpatient rehabilitation facility. See "History of Present Illness" (above) for medical update. Functional changes are: improvement in ambulation distance from Mod A +2 for 25 feet to Mod A +2 for 40 feet. Patient's medical and functional status update has been discussed with the Rehabilitation physician and patient remains appropriate for inpatient rehabilitation. Will admit to inpatient rehab today.   Preadmission Screen Completed By:  Raechel Ache, OT, 05/13/2019 12:10 PM ______________________________________________________________________   Discussed status with Dr. Posey Pronto on 1/6/201 at 12:10PM and received approval for admission today.   Admission Coordinator:  Raechel Ache, time 12:10PM Sudie Grumbling 05/13/19         Revision History Date/Time User Provider Type Action  05/13/2019 12:14 PM Jamse Arn, MD Physician Sign  05/13/2019 12:12 PM Raechel Ache, [place] [facility] Admission Coordinator Share  05/13/2019 12:11 PM Raechel Ache, [place] [facility] Admission Coordinator Share   View Details Report

## 2019-05-13 NOTE — Progress Notes (Signed)
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 VDRF 1/1-1/2. PMhx: dysarthria, aortic dissection s/p repair, AVR, GERD, CVA x 3    PT Comments    Pt very congested with watery breath sounds, but good sats with 3L [place].  Pt is so dysarthric as to be almost unintelligible at times.  Emphasis on toileting by necessity, gait, standing tolerance and transfers.    Follow Up Recommendations  CIR;Supervision/Assistance - 24 hour     Equipment Recommendations  Other (comment)(TBA)    Recommendations for Other Services Rehab consult     Precautions / Restrictions Precautions Precautions: Fall    Mobility  Bed Mobility Overal bed mobility: Needs Assistance Bed Mobility: Supine to Sit     Supine to sit: Mod assist;Min assist        Transfers Overall transfer level: Needs assistance Equipment used: None Transfers: Sit to/from Stand Sit to Stand: Mod assist Stand pivot transfers: Mod assist;+2 physical assistance       General transfer comment: cues for hand placement.  light moderate stability assist  Ambulation/Gait Ambulation/Gait assistance: Mod assist;+2 physical assistance;+2 safety/equipment Gait Distance (Feet): 16 Feet Assistive device: None Gait Pattern/deviations: Step-through pattern;Step-to pattern;Decreased stride length Gait velocity: slower   General Gait Details: Use "three muskateer" technique (pt's arms over therapists shoulders) to ambulate  allowing more w/shift help and allowing pt to concentrate on stepping with the right.   Stairs             Wheelchair Mobility    Modified Rankin (Stroke Patients Only) Modified Rankin (Stroke Patients Only) Pre-Morbid Rankin Score: Slight disability Modified Rankin:  Moderately severe disability     Balance Overall balance assessment: Needs assistance   Sitting balance-Leahy Scale: Fair Sitting balance - Comments: able to sit without UE support   Standing balance support: Bilateral upper extremity supported;Single extremity supported Standing balance-Leahy Scale: Poor Standing balance comment: pt needs something stable or stationary to hold to for support.                            Cognition Arousal/Alertness: Awake/alert Behavior During Therapy: Flat affect Overall Cognitive Status: Difficult to assess                                 General Comments: pt answers yes/no accurately      Exercises Other Exercises Other Exercises: Warm up hip/knee flexion/ext AA/AROM with resistance bil in gross extention.    General Comments General comments (skin integrity, edema, etc.): sats on 3L maintained at 98-100%.  Breath sounds very watery.      Pertinent Vitals/Pain Pain Assessment: Faces Faces Pain Scale: No hurt    Home Living                      Prior Function            PT Goals (current goals can now be found in the care plan section) Acute Rehab PT Goals PT Goal Formulation: With patient Time For Goal Achievement: 05/23/19 Potential to Achieve Goals: Good Progress towards PT goals: Progressing toward goals    Frequency    Min 4X/week      PT  Plan Current plan remains appropriate    Co-evaluation PT/OT/SLP Co-Evaluation/Treatment: Yes Reason for Co-Treatment: To address functional/ADL transfers;Complexity of the patient's impairments (multi-system involvement) PT goals addressed during session: Mobility/safety with mobility OT goals addressed during session: ADL's and self-care      AM-PAC PT "6 Clicks" Mobility   Outcome Measure  Help needed turning from your back to your side while in a flat bed without using bedrails?: A Lot Help needed moving from lying on your back to  sitting on the side of a flat bed without using bedrails?: A Lot Help needed moving to and from a bed to a chair (including a wheelchair)?: A Lot Help needed standing up from a chair using your arms (e.g., wheelchair or bedside chair)?: A Lot Help needed to walk in hospital room?: A Lot Help needed climbing 3-5 steps with a railing? : Total 6 Click Score: 11    End of Session   Activity Tolerance: Patient tolerated treatment well Patient left: in chair;with call bell/phone within reach;with chair alarm set Nurse Communication: Mobility status PT Visit Diagnosis: Other abnormalities of gait and mobility (R26.89);Other symptoms and signs involving the nervous system (R29.898);Hemiplegia and hemiparesis Hemiplegia - Right/Left: Right Hemiplegia - dominant/non-dominant: Dominant Hemiplegia - caused by: Cerebral infarction     Time: 1222-1256 PT Time Calculation (min) (ACUTE ONLY): 34 min  Charges:  $Therapeutic Activity: 8-22 mins                     05/13/2019  Jon Carne., PT Acute Rehabilitation Services [phone]  (pager) [phone]  (office)   Jon [name] 05/13/2019, 2:20 PM

## 2019-05-13 NOTE — H&P (Signed)
Physical Medicine and Rehabilitation Admission H&P    Chief Complaint  Patient presents with  . 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  He presented on 05/08/2019 with sudden right-sided weakness and left gaze deviation.  History taken from chart review due to severe dysarthria.  CT head showed abnormal hypodensity left colossal body and possible pericallosal gyrus.  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  CTA chest showed diffuse lung opacity concerning for PNA and dissection or aneurysm .  He underwent cerebral angiogram with attempted thrombectomy of left A2 occlusion without significant improvement in flow and nonflow limiting dissection of distal cervical segment identified.  Coumadin was subtherapeutic at admission -- multiple attempts made to contact family for input and the reported patient had been recently discharged from hospital on Lovenox twice daily, however however mother had not been administering injections.  Echocardiogram showed ejection fraction of 25-30%.  He was placed on IV heparin and neurology felt the stroke was embolic in setting of subtherapeutic INR.  He was placed on dysphagia 1 honey liquid diet with chin tuck due to dysphagia with aspiration of nectars on 01/03.  Follow-up MRI showed multifocal restricted diffusion involving left frontoparietal lobes in both ACA and MCA territory as well as involvement of corpus callosum minor involvement of left occipital lobe question watershed and contralateral involvement of right anterolateral  thalamus and right centrum ovale. He has had issues with hypotension requiring levophed off and on.  He had progressive leukocytosis with increasing cough and chest x-ray performed on 05/12/2019 personally reviewed, suggestive of aspiration pneumonia.  He was made n.p.o. with cortak for nutritional support. Therapy ongoing and patient showing ability to follow simple commands with expressive aphasia, dysarthria and poor safety awareness. CIR recommended due to functional decline.  Please see preadmission assessment from earlier today as well.  Review of Systems  Unable to perform ROS: Other  Severe dysarthria   Past Medical History:  Diagnosis Date  . Aneurysm of thoracic aorta (HCC) 05/06/2012   IMO routine update IMO routine update  . Anxiety    sees Dr. Renae Fickle  . Aortic aneurysm and dissection (HCC) 05/06/2012  . Aortic stenosis    mechanical AVR 04/22/12  . Aortic valve disorder 05/06/2012  . 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 Bicuspid aortic valve   . Cerebral infarction (HCC) 05/11/2012  . Cerebral thrombosis with cerebral infarction (HCC) 07/14/2013  . Cholelithiasis 12/20/2016  . Current use of long term anticoagulation 12/20/2016  . CVA (cerebral infarction)    occipital CVA 12/13  . Dissecting aortic aneurysm (HCC) 05/06/2012  . Dysarthria 09/10/2017  . Dysrhythmia    sees Dr. Geronimo Running, [facility] cardiology in [place]  . Generalized constriction of visual field 10/15/2012   IMOUPDATE IMOUPDATE  . 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 GERD (gastroesophageal reflux disease)   . HCAP (healthcare-associated pneumonia) 05/06/2012  . Headache(784.0)    "due to vision problem"  . History of CVA (cerebrovascular accident) 05/07/2012  . Hypothyroidism   . Idiopathic peripheral neuropathy 07/14/2013  . Late effects of  CVA (cerebrovascular accident) 08/08/2015  . Nontraumatic multiple localized intracerebral hemorrhages (HCC) 10/16/2017    . Paroxysmal atrial fibrillation (HCC) 08/08/2015   On warfarin On warfarin  . Presence of prosthetic heart valve 08/08/2015  . Shortness of breath   . Stroke (HCC)    x3  . Stroke, embolic (HCC) 05/07/2012  . Syncope 05/06/2012  . Thoracic aortic aneurysm (HCC)    replacement aortic root 04/22/12  . VBI (vertebrobasilar insufficiency) 09/10/2017    Past Surgical History:  Procedure Laterality Date  . APPLICATION OF WOUND VAC  06/11/2012   Procedure: APPLICATION OF WOUND VAC;  Surgeon: [name], MD;  Location: [facility] OR;  Service: Vascular;  Laterality: Right;  . CARDIAC CATHETERIZATION    . CHOLECYSTECTOMY    . CORONARY ARTERY BYPASS GRAFT  04/22/2012   Procedure: CORONARY ARTERY BYPASS GRAFTING (CABG);  Surgeon: [name], MD;  Location: [facility] OR;  Service: Open Heart Surgery;  Laterality: N/A;  times one with right greater saphenous vein graft  . I & D EXTREMITY  06/11/2012   Procedure: IRRIGATION AND DEBRIDEMENT EXTREMITY;  Surgeon: [name], MD;  Location: [facility] OR;  Service: Vascular;  Laterality: Right;  . INTRAOPERATIVE TRANSESOPHAGEAL ECHOCARDIOGRAM  04/22/2012   Procedure: INTRAOPERATIVE TRANSESOPHAGEAL ECHOCARDIOGRAM;  Surgeon: [name], MD;  Location: [facility] OR;  Service: Open Heart Surgery;  Laterality: N/A;  . IR CT HEAD LTD  05/08/2019  . IR PERCUTANEOUS ART THROMBECTOMY/INFUSION INTRACRANIAL INC DIAG ANGIO  05/08/2019  . IR US GUIDE VASC ACCESS RIGHT  05/08/2019  . RADIOLOGY WITH ANESTHESIA N/A 05/08/2019   Procedure: IR WITH ANESTHESIA;  Surgeon: Radiologist, Medication, MD;  Location: [facility] OR;  Service: Radiology;  Laterality: N/A;  . ROTATOR CUFF REPAIR     12 years ago, Left  . THORACIC AORTIC ANEURYSM REPAIR  04/22/2012   Procedure: THORACIC ASCENDING ANEURYSM REPAIR (AAA);  Surgeon: [name], MD;  Location[facility] OR;  Service: Open Heart Surgery;  Laterality: N/A;    Family History  Problem Relation Age of Onset  . Lung disease Mother    . Diabetes Father   . Peripheral Artery Disease Father   . Hypertension Father   . CAD Father   . Heart disease Father   . Stroke Father   . Cancer Maternal Grandfather   . Heart disease Paternal Grandfather     Social History:  Lives with mother. Was independent with AD PTA. He  reports that he has been smoking cigarettes. He started smoking about 7 years ago. He has been smoking about 2.50 packs per day. He has never used smokeless tobacco. History of alcohol abuse. He reports that he does not use drugs.    Allergies  Allergen Reactions  . Hydrocodone-Acetaminophen Other (See Comments)    PT MOTHER DOES NOT REMEMBER REACTION  Only use in Emergency   . Hydromorphone Itching and Rash       . Other Other (See Comments)    ALL NARCOTICS-ITCHING, RASH   . Oxycodone Other (See Comments)    Unknown reaction  . Penicillins Rash    Did it involve swelling of the face/tongue/throat, SOB, or low BP? Unknown Did it involve sudden or severe rash/hives, skin peeling, or any reaction on the inside of your mouth or nose? Unknown Did you need to seek medical attention at a hospital or doctor's office? Unknown When did it last happen?unknown If all above answers are "NO", may proceed with cephalosporin use.  . Dilaudid [Hydromorphone Hcl]  Itching and Rash    Medications Prior to Admission  Medication Sig Dispense Refill  . acetaminophen (TYLENOL) 500 MG tablet Take 1,000 mg by mouth every 4 (four) hours as needed for headache (pain).     Marland Kitchen albuterol (VENTOLIN HFA) 108 (90 Base) MCG/ACT inhaler Inhale into the lungs every 4 (four) hours as needed for wheezing or shortness of breath.    Marland Kitchen amiodarone (PACERONE) 200 MG tablet Take 1 tablet (200 mg total) by mouth 3 (three) times daily. (Patient taking differently: Take 200 mg by mouth daily. ) 90 tablet 1  . dicyclomine (BENTYL) 20 MG tablet Take 20 mg by mouth every 6 (six) hours as needed (abdominal pain).    . DULoxetine (CYMBALTA) 30  MG capsule Take 30 mg by mouth at bedtime.    . enoxaparin (LOVENOX) 100 MG/ML injection Inject 100 mg into the skin 2 (two) times daily.    . furosemide (LASIX) 20 MG tablet Take 20 mg by mouth daily.     Marland Kitchen gabapentin (NEURONTIN) 300 MG capsule Take 300 mg by mouth 3 (three) times daily.     Marland Kitchen ipratropium-albuterol (DUONEB) 0.5-2.5 (3) MG/3ML SOLN Take 3 mLs by nebulization 4 (four) times daily.    Marland Kitchen levothyroxine (SYNTHROID, LEVOTHROID) 50 MCG tablet Take 50 mcg by mouth at bedtime.     Marland Kitchen omeprazole (PRILOSEC) 20 MG capsule Take 20 mg by mouth daily.     . rosuvastatin (CRESTOR) 40 MG tablet Take 40 mg by mouth at bedtime.   12  . spironolactone (ALDACTONE) 25 MG tablet Take 12.5 mg by mouth daily.    Marland Kitchen tiotropium (SPIRIVA HANDIHALER) 18 MCG inhalation capsule Place 18 mcg into inhaler and inhale daily at 2 PM.    . topiramate (TOPAMAX) 50 MG tablet Take 50 mg by mouth at bedtime.   5  . aspirin 81 MG chewable tablet Chew 1 tablet (81 mg total) by mouth daily. (Patient not taking: Reported on 05/08/2019)    . empagliflozin (JARDIANCE) 10 MG TABS tablet Take 10 mg by mouth daily.    . sacubitril-valsartan (ENTRESTO) 24-26 MG Take 1 tablet by mouth 2 (two) times daily.    Marland Kitchen warfarin (COUMADIN) 5 MG tablet TAKE 1 & 1/2 TO 2 TABLETS BY MOUTH DAILY AS DIRECTED BY COUMADIN CLINIC (Patient taking differently: Take 2.5-5 mg by mouth See admin instructions. Take one tablet (5 mg) by mouth 1st night, then take 1/2 tablet (2.5 mg) 2nd night, then repeat) 65 tablet 0    Drug Regimen Review  Drug regimen was reviewed and remains appropriate with no significant issues identified  Home: Home Living Family/patient expects to be discharged to:: Private residence Living Arrangements: Parent Available Help at Discharge: Family, Available 24 hours/day Type of Home: House Home Access: Stairs to enter [employer] of Steps: 3-5 (back), 3 front Home Layout: One level Bathroom Shower/Tub: Scientist, forensic: Standard Home Equipment: Civil engineer, contracting, Bedside commode, Environmental consultant - 2 wheels, Cane - single point, Wheelchair - manual Additional Comments: Info from pt s/p extubation   Functional History: Prior Function Level of Independence: Independent with assistive device(s) Comments: pt reports that he lives with his mother, but was independent with ADL and mobility with an AD usually  Functional Status:  Mobility: Bed Mobility Overal bed mobility: Needs Assistance Bed Mobility: Supine to Sit Rolling: Mod assist Sidelying to sit: Mod assist(via R side) Supine to sit: Min assist General bed mobility comments: pt bridged to EOB with cuing only, He  attempted to throw his legs off the bed to build momentum, but still needed minimal truncal assist. Transfers Overall transfer level: Needs assistance Equipment used: None Transfers: Sit to/from Stand Sit to Stand: Mod assist Stand pivot transfers: Mod assist, +2 physical assistance, +2 safety/equipment, From elevated surface General transfer comment: cues for appropriate hand placement, assist to come forward and up, guarding R LE. Ambulation/Gait Ambulation/Gait assistance: Mod assist, +2 physical assistance, +2 safety/equipment Gait Distance (Feet): 40 Feet Assistive device: None Gait Pattern/deviations: Step-through pattern, Step-to pattern, Decreased step length - left, Decreased stance time - right, Decreased step length - right, Decreased stride length General Gait Details: Use "three muskateer" technique (pt's arms over therapists shoulders) to ambulate  allowing more w/shift help and allowing pt to concentrate on stepping with the right. Gait velocity: slower Gait velocity interpretation: <1.8 ft/sec, indicate of risk for recurrent falls    ADL: ADL Overall ADL's : Needs assistance/impaired Eating/Feeding: Modified independent, Sitting Grooming: Min guard, Sitting Upper Body Bathing: Minimal assistance, Sitting Lower  Body Bathing: Moderate assistance, Cueing for safety, Sitting/lateral leans, Sit to/from stand Upper Body Dressing : Minimal assistance, Sitting Lower Body Dressing: Moderate assistance, Sitting/lateral leans, Sit to/from stand Toilet Transfer: Moderate assistance, +2 for physical assistance, +2 for safety/equipment, Cueing for safety, Cueing for sequencing, Ambulation Toileting- Clothing Manipulation and Hygiene: Moderate assistance, +2 for physical assistance, +2 for safety/equipment, Sitting/lateral lean, Sit to/from stand Functional mobility during ADLs: Minimal assistance, Moderate assistance, +2 for physical assistance, +2 for safety/equipment, Cueing for safety, Cueing for sequencing General ADL Comments: Pt limited by poor strength in R side, poor mobility and decreased activity tolerance  Cognition: Cognition Overall Cognitive Status: Difficult to assess Orientation Level: Oriented X4 Cognition Arousal/Alertness: Awake/alert Behavior During Therapy: Flat affect Overall Cognitive Status: Difficult to assess General Comments: pt with expressive aphasia answering yes/no questions appropriately and attempting to expand upon statements but difficult to understand Difficult to assess due to: Impaired communication   Blood pressure 120/81, pulse 78, temperature 99.3 F (37.4 C), temperature source Oral, resp. rate (!) 37, height 5\' 11"  (1.803 m), weight 106 kg, SpO2 (!) 89 %. Physical Exam  Nursing note and vitals reviewed. Constitutional: He appears well-developed.  Obese.  HENT:  Head: Normocephalic and atraumatic.  Mouth/Throat: Mucous membranes are dry. Oropharyngeal exudate present.  + NG  Eyes: EOM are normal. Right eye exhibits no discharge. Left eye exhibits no discharge.  Neck: No tracheal deviation present. No thyromegaly present.  Cardiovascular: Normal rate and regular rhythm.  Respiratory: No stridor. He has rales in the right lower field, the left middle field and the  left lower field.  Increased work of breathing Rhonchi throughout + [place]  GI: Soft. Bowel sounds are normal. He exhibits no distension. There is no abdominal tenderness.  Musculoskeletal:        General: No tenderness or edema.     Comments: No edema or tenderness in extremities. Healing abrasions bilateral knees.   Neurological: He is alert. A cranial nerve deficit is present.  Wet congested sounds with inability to clear secretions. Severe dysarthria with garbled sounds. He is able to follow simple motor commands.  Motor: LUE/LLE: 5/5 proximal distal RUE: 3/5 proximal distal RLE: Flexion, knee extension 2/5, dorsiflexion 0/5  Skin: Skin is warm and dry.  Psychiatric:  Unable to assess due to dysarthria    Results for orders placed or performed during the hospital encounter of 05/08/19 (from the past 48 hour(s))  Glucose, capillary     Status:  None   Collection Time: 05/11/19 11:42 AM  Result Value Ref Range   Glucose-Capillary 90 70 - 99 mg/dL   Comment 1 Notify RN    Comment 2 Document in Chart   Glucose, capillary     Status: None   Collection Time: 05/11/19  3:55 PM  Result Value Ref Range   Glucose-Capillary 90 70 - 99 mg/dL   Comment 1 Notify RN    Comment 2 Document in Chart   Glucose, capillary     Status: Abnormal   Collection Time: 05/11/19  7:47 PM  Result Value Ref Range   Glucose-Capillary 113 (H) 70 - 99 mg/dL  Glucose, capillary     Status: Abnormal   Collection Time: 05/11/19 11:41 PM  Result Value Ref Range   Glucose-Capillary 181 (H) 70 - 99 mg/dL  Glucose, capillary     Status: None   Collection Time: 05/12/19  3:30 AM  Result Value Ref Range   Glucose-Capillary 70 70 - 99 mg/dL  Heparin level (unfractionated)     Status: None   Collection Time: 05/12/19  7:16 AM  Result Value Ref Range   Heparin Unfractionated 0.35 0.30 - 0.70 IU/mL    Comment: (NOTE) If heparin results are below expected values, and patient dosage has  been confirmed, suggest  follow up testing of antithrombin III levels. Performed at [facility], [address], [place], [place] [address]   CBC with Differential     Status: Abnormal   Collection Time: 05/12/19  7:16 AM  Result Value Ref Range   WBC 19.8 (H) 4.0 - 10.5 K/uL   RBC 3.81 (L) 4.22 - 5.81 MIL/uL   Hemoglobin 11.0 (L) 13.0 - 17.0 g/dL   HCT 72.5 (L) 36.6 - 44.0 %   MCV 89.2 80.0 - 100.0 fL   MCH 28.9 26.0 - 34.0 pg   MCHC 32.4 30.0 - 36.0 g/dL   RDW 34.7 (H) 42.5 - 95.6 %   Platelets 292 150 - 400 K/uL   nRBC 0.0 0.0 - 0.2 %   Neutrophils Relative % 85 %   Neutro Abs 16.9 (H) 1.7 - 7.7 K/uL   Lymphocytes Relative 5 %   Lymphs Abs 0.9 0.7 - 4.0 K/uL   Monocytes Relative 8 %   Monocytes Absolute 1.6 (H) 0.1 - 1.0 K/uL   Eosinophils Relative 1 %   Eosinophils Absolute 0.2 0.0 - 0.5 K/uL   Basophils Relative 0 %   Basophils Absolute 0.0 0.0 - 0.1 K/uL   Immature Granulocytes 1 %   Abs Immature Granulocytes 0.18 (H) 0.00 - 0.07 K/uL    Comment: Performed at [facility], [address], [place], [place] [address]  Magnesium     Status: None   Collection Time: 05/12/19  7:16 AM  Result Value Ref Range   Magnesium 1.8 1.7 - 2.4 mg/dL    Comment: Performed at [facility], [address], [place], [place] [address]  Phosphorus     Status: None   Collection Time: 05/12/19  7:16 AM  Result Value Ref Range   Phosphorus 4.0 2.5 - 4.6 mg/dL    Comment: Performed at [facility], [address], [place], [place] [address]  Protime-INR     Status: Abnormal   Collection Time: 05/12/19  7:16 AM  Result Value Ref Range   Prothrombin Time 15.6 (H) 11.4 - 15.2 seconds   INR 1.3 (H) 0.8 - 1.2  Comment: (NOTE) INR goal varies based on device and disease states. Performed at [facility], [address], [place], [place] [address]   Basic metabolic panel     Status: Abnormal   Collection Time: 05/12/19  7:16 AM  Result Value Ref Range   Sodium 138 135 - 145 mmol/L     Potassium 3.5 3.5 - 5.1 mmol/L   Chloride 104 98 - 111 mmol/L   CO2 23 22 - 32 mmol/L   Glucose, Bld 92 70 - 99 mg/dL   BUN 6 6 - 20 mg/dL   Creatinine, Ser 7.65 0.61 - 1.24 mg/dL   Calcium 8.6 (L) 8.9 - 10.3 mg/dL   GFR calc non Af Amer >60 >60 mL/min   GFR calc Af Amer >60 >60 mL/min   Anion gap 11 5 - 15    Comment: Performed at [facility], [address], [place], [place] [address]  Glucose, capillary     Status: None   Collection Time: 05/12/19  8:02 AM  Result Value Ref Range   Glucose-Capillary 88 70 - 99 mg/dL   Comment 1 Notify RN    Comment 2 Document in Chart   Glucose, capillary     Status: None   Collection Time: 05/12/19 12:22 PM  Result Value Ref Range   Glucose-Capillary 89 70 - 99 mg/dL   Comment 1 Notify RN    Comment 2 Document in Chart   Glucose, capillary     Status: None   Collection Time: 05/12/19  3:44 PM  Result Value Ref Range   Glucose-Capillary 94 70 - 99 mg/dL   Comment 1 Notify RN    Comment 2 Document in Chart   Glucose, capillary     Status: None   Collection Time: 05/12/19  7:34 PM  Result Value Ref Range   Glucose-Capillary 91 70 - 99 mg/dL  Glucose, capillary     Status: Abnormal   Collection Time: 05/12/19 11:23 PM  Result Value Ref Range   Glucose-Capillary 107 (H) 70 - 99 mg/dL  Heparin level (unfractionated)     Status: Abnormal   Collection Time: 05/13/19  3:30 AM  Result Value Ref Range   Heparin Unfractionated 0.21 (L) 0.30 - 0.70 IU/mL    Comment: (NOTE) If heparin results are below expected values, and patient dosage has  been confirmed, suggest follow up testing of antithrombin III levels. Performed at [facility], [address] [place], [place] [address]   CBC     Status: Abnormal   Collection Time: 05/13/19  3:30 AM  Result Value Ref Range   WBC 17.8 (H) 4.0 - 10.5 K/uL   RBC 4.00 (L) 4.22 - 5.81 MIL/uL   Hemoglobin 11.4 (L) 13.0 - 17.0 g/dL   HCT 81.2 (L) 75.1 - 70.0 %   MCV 90.0 80.0 - 100.0 fL    MCH 28.5 26.0 - 34.0 pg   MCHC 31.7 30.0 - 36.0 g/dL   RDW 17.4 (H) 94.4 - 96.7 %   Platelets 263 150 - 400 K/uL   nRBC 0.0 0.0 - 0.2 %    Comment: Performed at [facility], [address], [place], [place] [address]  Protime-INR     Status: None   Collection Time: 05/13/19  3:30 AM  Result Value Ref Range   Prothrombin Time 14.1 11.4 - 15.2 seconds   INR 1.1 0.8 - 1.2    Comment: (NOTE) INR goal varies based on  device and disease states. Performed at [facility], [address] [place], [place] [address]   Basic metabolic panel     Status: Abnormal   Collection Time: 05/13/19  3:30 AM  Result Value Ref Range   Sodium 138 135 - 145 mmol/L   Potassium 3.4 (L) 3.5 - 5.1 mmol/L   Chloride 103 98 - 111 mmol/L   CO2 25 22 - 32 mmol/L   Glucose, Bld 132 (H) 70 - 99 mg/dL   BUN 7 6 - 20 mg/dL   Creatinine, Ser 1.51 0.61 - 1.24 mg/dL   Calcium 8.5 (L) 8.9 - 10.3 mg/dL   GFR calc non Af Amer >60 >60 mL/min   GFR calc Af Amer >60 >60 mL/min   Anion gap 10 5 - 15    Comment: Performed at [facility], [address], [place], [place] [address]  Glucose, capillary     Status: Abnormal   Collection Time: 05/13/19  3:47 AM  Result Value Ref Range   Glucose-Capillary 137 (H) 70 - 99 mg/dL  Glucose, capillary     Status: Abnormal   Collection Time: 05/13/19  7:50 AM  Result Value Ref Range   Glucose-Capillary 141 (H) 70 - 99 mg/dL   Comment 1 Notify RN    Comment 2 Document in Chart    DG CHEST PORT 1 VIEW  Result Date: 05/12/2019 CLINICAL DATA:  Aspiration into airway. EXAM: PORTABLE CHEST 1 VIEW COMPARISON:  Chest radiograph 05/09/2019 FINDINGS: Unchanged cardiomegaly. Aortic atherosclerosis. Redemonstrated left chest dual lead pacer. Prior median sternotomy with cardiac valve prosthesis. Shallow inspiration radiograph. Similar appearance of interstitial and ill-defined opacity throughout the left lung. No sizable pleural effusion or evidence of pneumothorax. No  acute bony abnormality. IMPRESSION: Shallow inspiration radiograph. Similar appearance of interstitial and ill-defined opacities throughout the left lung. Findings are suspicious for pneumonia given provided history. Asymmetric edema is also a consideration. Electronically Signed   By: Jackey Loge DO   On: 05/12/2019 10:59       Medical Problem List and Plan: 1.  Expressive aphasia, dysarthria, poor safety awareness, limitations in self-care, dysphagia secondary to bilateral CVA, left greater than right.  -patient may may shower  -ELOS/Goals: 18-22 days/min A  Admit to CIR 2.  Antithrombotics: -DVT/anticoagulation:  Pharmaceutical:Heparin GGT bridge to Coumadin.  -antiplatelet therapy: on ASA 3. Pain Management: tylenol prn 4. Mood: LCSW to follow for evaluation and support when appropriate.   -antipsychotic agents: N/A 5. Neuropsych: This patient is not fully capable of making decisions on his own behalf. 6. Skin/Wound Care: Routine pressure relief measures.  7. Fluids/Electrolytes/Nutrition: NPO. Continue tube feeds--add water flushes. Discontinue IVF.  CMP ordered for tomorrow a.m. 8. Aspiration PNA: On Unasyn day #2/7 with downward trend in leucocytosis. Needs to be upright with pulmonary toilet.  Wean supplemental oxygen as tolerated  9. CAF/CHB s/p PPM:   Monitor HR tid--off amiodarone and Entresto due to issues with hypotension.  11 COPD/OSA: Duonebs and incruse resumed today. Encourage IS and keep HOB>30 degrees.  12. Depression/anxiety disorder: Ego support 19. H/o migraines: Has been managed on Topamax and gabapentin.  14. Hypokalemia: Add Kdur for supplement due to hypokalemia.  BMP ordered for tomorrow a.m.   Jacquelynn Cree, PA-C 05/13/2019  I have personally performed a face to face diagnostic evaluation, including, but not limited to relevant history and physical exam findings, of this patient and developed relevant assessment and plan.  Additionally, I have reviewed and  concur with the physician assistant's documentation above.  Maryla MorrowAnkit Alonzo Owczarzak, MD, ABPMR

## 2019-05-13 NOTE — TOC Transition Note (Signed)
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 VDRF 1/1-1/2. PMhx: dysarthria, aortic dissection s/p repair, AVR, GERD, CVA x 3  PTA, pt resided at home with his mother; independent with ADLS using assistive device.  PT/OT recommending CIR, and pt has been accepted for admission to inpatient rehab today.  Mother able to provide needed care at discharge.     Final next level of care: IP Rehab Facility Barriers to Discharge: Barriers Resolved   Patient Goals and CMS Choice     Choice offered to / list presented to : Patient  Discharge Placement                       Discharge Plan and Services   Discharge Planning Services: CM Consult Post Acute Care Choice: IP Rehab                               Social Determinants of Health (SDOH) Interventions     Readmission Risk Interventions No flowsheet data found.  Quintella Baton, RN, BSN  Trauma/Neuro ICU Case Manager [phone]

## 2019-05-13 NOTE — Progress Notes (Signed)
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 VDRF 1/1-1/2. PMhx: dysarthria, aortic dissection s/p repair, AVR, GERD, CVA x 3   OT comments  Pt progressing well with therapy. Pt modA overall for self care and in standing modA +2 for safety. Pt unable to assist in standing due to instability. Pt increasing strength on R side, but unable to fully grip items or hold on during "3 muskateers" transfer with OT/PT on each side of pt for mobility. Pt incontinent this session. Pt continues to produce speech that is not always intelligible. Pt following all commands. Pt requires continued OT. OT following.     Follow Up Recommendations  CIR    Equipment Recommendations       Recommendations for Other Services      Precautions / Restrictions Precautions Precautions: Fall Restrictions Weight Bearing Restrictions: No       Mobility Bed Mobility Overal bed mobility: Needs Assistance Bed Mobility: Supine to Sit     Supine to sit: Mod assist;Min assist     General bed mobility comments: requiring assist for trunk  Transfers Overall transfer level: Needs assistance Equipment used: None Transfers: Sit to/from Stand Sit to Stand: Mod assist Stand pivot transfers: Mod assist;+2 physical assistance       General transfer comment: hand placement assist    Balance Overall balance assessment: Needs assistance   Sitting balance-Leahy Scale: Fair Sitting balance - Comments: able to sit without UE support   Standing balance support: Bilateral upper extremity supported;Single extremity supported Standing balance-Leahy Scale: Poor                             ADL either performed or assessed with clinical judgement   ADL Overall ADL's  : Needs assistance/impaired     Grooming: Minimal assistance;Sitting Grooming Details (indicate cue type and reason): able to lightly brush teeth, but pt wuth residue and requiring assist for task. Foul odor.                 Toilet Transfer: Moderate assistance;+2 for physical assistance;+2 for safety/equipment;Cueing for safety;Cueing for sequencing;Ambulation   Toileting- Clothing Manipulation and Hygiene: Moderate assistance;+2 for physical assistance;+2 for safety/equipment;Sitting/lateral lean;Sit to/from stand Toileting - Clothing Manipulation Details (indicate cue type and reason): pt unable to assist due to poor balance     Functional mobility during ADLs: Minimal assistance;Moderate assistance;+2 for physical assistance;+2 for safety/equipment("3 muskateers") General ADL Comments: Pt limited by poor strength in R side, poor mobility and decreased activity tolerance     Vision   Vision Assessment?: Yes;No apparent visual deficits Additional Comments: takes a minute to focus   Perception     Praxis      Cognition Arousal/Alertness: Awake/alert Behavior During Therapy: Flat affect Overall Cognitive Status: Difficult to assess                                 General Comments: pt answers yes/no accurately        Exercises     Shoulder Instructions       General Comments sats on 3L maintained at 98-100%.  Breath sounds very watery.    Pertinent Vitals/ Pain  Pain Assessment: Faces Faces Pain Scale: No hurt  Home Living                                          Prior Functioning/Environment              Frequency  Min 2X/week        Progress Toward Goals  OT Goals(current goals can now be found in the care plan section)  Progress towards OT goals: Progressing toward goals  Acute Rehab OT Goals Patient Stated Goal: race cars OT Goal Formulation: With patient Time For Goal Achievement:  05/23/19 Potential to Achieve Goals: Good ADL Goals Pt Will Perform Grooming: with min guard assist;standing Pt Will Perform Upper Body Dressing: with set-up;sitting Pt Will Perform Lower Body Dressing: with set-up;sitting/lateral leans Pt Will Transfer to Toilet: with min guard assist;ambulating;regular height toilet Pt Will Perform Toileting - Clothing Manipulation and hygiene: with min assist;sitting/lateral leans;sit to/from stand Pt/caregiver will Perform Home Exercise Program: Increased strength;Right Upper extremity;With written HEP provided  Plan Discharge plan remains appropriate    Co-evaluation    PT/OT/SLP Co-Evaluation/Treatment: Yes Reason for Co-Treatment: Complexity of the patient's impairments (multi-system involvement);To address functional/ADL transfers   OT goals addressed during session: ADL's and self-care      AM-PAC OT "6 Clicks" Daily Activity     Outcome Measure   Help from another person eating meals?: A Little Help from another person taking care of personal grooming?: A Lot Help from another person toileting, which includes using toliet, bedpan, or urinal?: A Lot Help from another person bathing (including washing, rinsing, drying)?: A Lot Help from another person to put on and taking off regular upper body clothing?: A Little Help from another person to put on and taking off regular lower body clothing?: A Lot 6 Click Score: 14    End of Session Equipment Utilized During Treatment: Gait belt  OT Visit Diagnosis: Unsteadiness on feet (R26.81);Muscle weakness (generalized) (M62.81);Hemiplegia and hemiparesis;Cognitive communication deficit (R41.841) Symptoms and signs involving cognitive functions: Other cerebrovascular disease Hemiplegia - Right/Left: Right Hemiplegia - dominant/non-dominant: Dominant Hemiplegia - caused by: Unspecified   Activity Tolerance Patient tolerated treatment well   Patient Left in chair;with call bell/phone within  reach;with chair alarm set;with nursing/sitter in room   Nurse Communication Mobility status        Time: 7092-9574 OT Time Calculation (min): 37 min  Charges: OT General Charges $OT Visit: 1 Visit OT Treatments $Self Care/Home Management : 8-22 mins  Darryl Nestle) Marsa Aris OTR/L Acute Rehabilitation Services Pager: [phone] Office: [phone]   Audie Pinto 05/13/2019, 6:39 PM

## 2019-05-13 NOTE — Progress Notes (Signed)
Erick Colace, MD  Physician  Physical Medicine and Rehabilitation  Consult Note  Signed  Date of Service:  05/11/2019  8:40 AM      Related encounter: ED to Hosp-Admission (Current) from 05/08/2019 in [facility] NEURO/TRAUMA/SURGICAL ICU      Signed      Expand AllCollapse All            Physical Medicine and Rehabilitation Consult   Reason for Consult: Stroke with functional deficits.  Referring Physician: Dr. Pearlean Brownie.      HPI: Jon [name] is a 54 y.o. 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 He was admitted on 05/08/19 with sudden onset of left ward and right flaccid paralysis. . CT head showed abnormal hypodensity in left callosal body and possibly pericallosal gyrus on the left. CTA head/neck showed thrombosis of right innominate artery extending into right CCA with high grade stenosis and abrupt occlusion of proximal A2 left ACA with reconstitution distally and extensive airspace disease in left apex suspicious for PNA.  CTA chest showed stable diffuse lung opacity concerning for PNA and no definite dissection or aneurysm of thoracic or aortic aorta and thrombosis of right innominate artery. He underwent cerebral angio with attempted thrombectomy of left A2 occlusion without significant improvement in flow and non flow limiting dissection of distal cervical segment identified.   After multiple attempts, family contacted for input and reported that patient recently discharged from hospital on lovenox bid due to subtherapeutic INR but mother had not been administering injections. 2 D echo showed eF 25-30% with no SOE. He was placed on IV heparin and MRI brain pending. Stoke felt to be embolic in setting of subtherapeutic INR and work up pending. Therapy evaluations completed revealing severe dysarthria, dysphagia with aspiration of nectars--on dysphagia 1, honey liquids, RLE weakness with hypoxia affecting mobility. CIR recommended  due to functional decline.      ROS  Cannot obtain due to severe dysarthria       Past Medical History:  Diagnosis Date  . Aneurysm of thoracic aorta (HCC) 05/06/2012    IMO routine update IMO routine update  . Anxiety      sees Dr. Renae Fickle  . Aortic aneurysm and dissection (HCC) 05/06/2012  . Aortic stenosis      mechanical AVR 04/22/12  . Aortic valve disorder 05/06/2012  . 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 Bicuspid aortic valve    . Cerebral infarction (HCC) 05/11/2012  . Cerebral thrombosis with cerebral infarction (HCC) 07/14/2013  . Cholelithiasis 12/20/2016  . Current use of long term anticoagulation 12/20/2016  . CVA (cerebral infarction)      occipital CVA 12/13  . Dissecting aortic aneurysm (HCC) 05/06/2012  . Dysarthria 09/10/2017  . Dysrhythmia      sees Dr. Geronimo Running, [facility] cardiology in [place]  . Generalized constriction of visual field 10/15/2012    IMOUPDATE IMOUPDATE  . 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 GERD (gastroesophageal reflux disease)    . HCAP (healthcare-associated pneumonia) 05/06/2012  . Headache(784.0)      "due to vision problem"  . History of CVA (cerebrovascular accident) 05/07/2012  . Hypothyroidism    . Idiopathic peripheral neuropathy 07/14/2013  . Late effects of CVA (cerebrovascular accident) 08/08/2015  . Nontraumatic multiple localized intracerebral hemorrhages (HCC) 10/16/2017  . Paroxysmal atrial fibrillation (HCC) 08/08/2015    On warfarin On warfarin  . Presence of prosthetic heart valve 08/08/2015  . Shortness  of breath    . Stroke ([place])      x3  . Stroke, embolic ([place]) 06/11/4268  . Syncope 05/06/2012  . Thoracic aortic aneurysm ([place])      replacement aortic root 04/22/12  . VBI (vertebrobasilar insufficiency) 09/10/2017           Past Surgical History:  Procedure Laterality Date  . APPLICATION OF WOUND VAC   06/11/2012    Procedure: APPLICATION OF WOUND VAC;  Surgeon: Melrose Nakayama, MD;  Location: [place];  Service: Vascular;  Laterality: Right;  . CARDIAC CATHETERIZATION      . CHOLECYSTECTOMY      . CORONARY ARTERY BYPASS GRAFT   04/22/2012    Procedure: CORONARY ARTERY BYPASS GRAFTING (CABG);  Surgeon: Melrose Nakayama, MD;  Location: [place];  Service: Open Heart Surgery;  Laterality: N/A;  times one with right greater saphenous vein graft  . I & D EXTREMITY   06/11/2012    Procedure: IRRIGATION AND DEBRIDEMENT EXTREMITY;  Surgeon: Melrose Nakayama, MD;  Location: [place];  Service: Vascular;  Laterality: Right;  . INTRAOPERATIVE TRANSESOPHAGEAL ECHOCARDIOGRAM   04/22/2012    Procedure: INTRAOPERATIVE TRANSESOPHAGEAL ECHOCARDIOGRAM;  Surgeon: Melrose Nakayama, MD;  Location: [place];  Service: Open Heart Surgery;  Laterality: N/A;  . IR CT HEAD LTD   05/08/2019  . IR PERCUTANEOUS ART THROMBECTOMY/INFUSION INTRACRANIAL INC DIAG ANGIO   05/08/2019  . IR US GUIDE VASC ACCESS RIGHT   05/08/2019  . ROTATOR CUFF REPAIR        12 years ago, Left  . THORACIC AORTIC ANEURYSM REPAIR   04/22/2012    Procedure: THORACIC ASCENDING ANEURYSM REPAIR (AAA);  Surgeon: Melrose Nakayama, MD;  Location: [place];  Service: Open Heart Surgery;  Laterality: N/A;           Family History  Problem Relation Age of Onset  . Lung disease Mother    . Diabetes Father    . Peripheral Artery Disease Father    . Hypertension Father    . CAD Father    . Heart disease Father    . Stroke Father    . Cancer Maternal Grandfather    . Heart disease Paternal Grandfather        Social History:  reports that he has been smoking cigarettes. He started smoking about 7 years ago. He has been smoking about 2.50 packs per day. He has never used smokeless tobacco. He reports current alcohol use. He reports that he does not use drugs.           Allergies  Allergen Reactions  . Hydrocodone-Acetaminophen Other (See Comments)      PT MOTHER DOES NOT REMEMBER REACTION  Only use in  Emergency    . Hydromorphone Itching and Rash           . Other Other (See Comments)      ALL NARCOTICS-ITCHING, RASH    . Oxycodone Other (See Comments)      Unknown reaction  . Penicillins Rash      Did it involve swelling of the face/tongue/throat, SOB, or low BP? Unknown Did it involve sudden or severe rash/hives, skin peeling, or any reaction on the inside of your mouth or nose? Unknown Did you need to seek medical attention at a hospital or doctor's office? Unknown When did it last happen?  unknown    If all above answers are "NO", may proceed with cephalosporin use.  . Dilaudid [Hydromorphone  Hcl] Itching and Rash            Medications Prior to Admission  Medication Sig Dispense Refill  . acetaminophen (TYLENOL) 500 MG tablet Take 1,000 mg by mouth every 4 (four) hours as needed for headache (pain).       Marland Kitchen albuterol (VENTOLIN HFA) 108 (90 Base) MCG/ACT inhaler Inhale into the lungs every 4 (four) hours as needed for wheezing or shortness of breath.      Marland Kitchen amiodarone (PACERONE) 200 MG tablet Take 1 tablet (200 mg total) by mouth 3 (three) times daily. (Patient taking differently: Take 200 mg by mouth daily. ) 90 tablet 1  . dicyclomine (BENTYL) 20 MG tablet Take 20 mg by mouth every 6 (six) hours as needed (abdominal pain).      . DULoxetine (CYMBALTA) 30 MG capsule Take 30 mg by mouth at bedtime.      . enoxaparin (LOVENOX) 100 MG/ML injection Inject 100 mg into the skin 2 (two) times daily.      . furosemide (LASIX) 20 MG tablet Take 20 mg by mouth daily.       Marland Kitchen gabapentin (NEURONTIN) 300 MG capsule Take 300 mg by mouth 3 (three) times daily.       Marland Kitchen ipratropium-albuterol (DUONEB) 0.5-2.5 (3) MG/3ML SOLN Take 3 mLs by nebulization 4 (four) times daily.      Marland Kitchen levothyroxine (SYNTHROID, LEVOTHROID) 50 MCG tablet Take 50 mcg by mouth at bedtime.       Marland Kitchen omeprazole (PRILOSEC) 20 MG capsule Take 20 mg by mouth daily.       . rosuvastatin (CRESTOR) 40 MG tablet Take 40 mg by  mouth at bedtime.    12  . spironolactone (ALDACTONE) 25 MG tablet Take 12.5 mg by mouth daily.      Marland Kitchen tiotropium (SPIRIVA HANDIHALER) 18 MCG inhalation capsule Place 18 mcg into inhaler and inhale daily at 2 PM.      . topiramate (TOPAMAX) 50 MG tablet Take 50 mg by mouth at bedtime.    5  . aspirin 81 MG chewable tablet Chew 1 tablet (81 mg total) by mouth daily. (Patient not taking: Reported on 05/08/2019)      . empagliflozin (JARDIANCE) 10 MG TABS tablet Take 10 mg by mouth daily.      . sacubitril-valsartan (ENTRESTO) 24-26 MG Take 1 tablet by mouth 2 (two) times daily.      Marland Kitchen warfarin (COUMADIN) 5 MG tablet TAKE 1 & 1/2 TO 2 TABLETS BY MOUTH DAILY AS DIRECTED BY COUMADIN CLINIC (Patient taking differently: Take 2.5-5 mg by mouth See admin instructions. Take one tablet (5 mg) by mouth 1st night, then take 1/2 tablet (2.5 mg) 2nd night, then repeat) 65 tablet 0      Home: Home Living Family/patient expects to be discharged to:: Private residence Living Arrangements: Parent Available Help at Discharge: Family, Available 24 hours/day Type of Home: House Home Access: Stairs to enter [employer] of Steps: 3-5 (back), 3 front Home Layout: One level Bathroom Shower/Tub: Engineer, manufacturing systems: Standard Home Equipment: Information systems manager, Bedside commode, Environmental consultant - 2 wheels, Cane - single point, Wheelchair - manual Additional Comments: Info from pt s/p extubation  Functional History: Prior Function Level of Independence: Independent with assistive device(s) Comments: pt reports that he lives with his mother, but was independent with ADL and mobility with an AD usually Functional Status:  Mobility: Bed Mobility Overal bed mobility: Needs Assistance Bed Mobility: Rolling, Sidelying to Sit Rolling: Mod assist  Sidelying to sit: Mod assist(via R side) Supine to sit: HOB elevated, Min guard General bed mobility comments: cued for technique, assist given slowly to allow pt to  assist with UE's as able. Transfers Overall transfer level: Needs assistance Equipment used: 2 person hand held assist Transfers: Sit to/from Stand Sit to Stand: Mod assist Stand pivot transfers: Mod assist, +2 physical assistance, +2 safety/equipment, From elevated surface General transfer comment: from a lower surface, cues for hand placement, assist to come forward and support for pt to boost. Ambulation/Gait Ambulation/Gait assistance: Mod assist, +2 physical assistance, +2 safety/equipment Gait Distance (Feet): 25 Feet Assistive device: Rolling walker (2 wheeled) Gait Pattern/deviations: Step-to pattern, Step-through pattern, Decreased step length - right, Decreased step length - left, Decreased stance time - right, Decreased stride length General Gait Details: Used RW which turned out to be a liability due to pt unable to keep R hand on the RW without consistent assist.  Pt need w/shift R assist and support while he advance his R LE then stability/support at R knee for advancing the L LE.  Second help was for IV and controlling the RW and could not then support the patient. Gait velocity: slower Gait velocity interpretation: <1.8 ft/sec, indicate of risk for recurrent falls   ADL: ADL Overall ADL's : Needs assistance/impaired Eating/Feeding: Modified independent, Sitting Grooming: Min guard, Sitting Upper Body Bathing: Minimal assistance, Sitting Lower Body Bathing: Moderate assistance, Cueing for safety, Sitting/lateral leans, Sit to/from stand Upper Body Dressing : Minimal assistance, Sitting Lower Body Dressing: Moderate assistance, Sitting/lateral leans, Sit to/from stand Toilet Transfer: Moderate assistance, +2 for physical assistance, +2 for safety/equipment, Cueing for safety, Cueing for sequencing, Ambulation Toileting- Clothing Manipulation and Hygiene: Moderate assistance, +2 for physical assistance, +2 for safety/equipment, Sitting/lateral lean, Sit to/from  stand Functional mobility during ADLs: Minimal assistance, Moderate assistance, +2 for physical assistance, +2 for safety/equipment, Cueing for safety, Cueing for sequencing General ADL Comments: Pt limited by poor strength in R side, poor mobility and decreased activity tolerance   Cognition: Cognition Overall Cognitive Status: Difficult to assess(seems to answer all simple questions approp. given time) Orientation Level: Oriented X4 Cognition Arousal/Alertness: Awake/alert Behavior During Therapy: Flat affect Overall Cognitive Status: Difficult to assess(seems to answer all simple questions approp. given time) General Comments: pt with expressive aphasia answering yes/no questions appropriately and attempting to expand upon statements but difficult to understand Difficult to assess due to: Impaired communication   Blood pressure 137/80, pulse 66, temperature 97.7 F (36.5 C), temperature source Oral, resp. rate (!) 22, height 5\' 11"  (1.803 m), weight 106 kg, SpO2 98 %. Physical Exam  Constitutional: He appears well-developed and well-nourished.  HENT:  Head: Normocephalic and atraumatic.  Eyes: Pupils are equal, round, and reactive to light. Conjunctivae and EOM are normal.  Cardiovascular: An irregularly irregular rhythm present.  + valve click  Respiratory: Effort normal and breath sounds normal. No respiratory distress. He has no wheezes. He has no rales.  GI: Soft. Bowel sounds are normal. He exhibits no distension.  Genitourinary:    Genitourinary Comments: Urinary catheter Right inguinal area bandaged   Musculoskeletal:        General: No tenderness, deformity or edema.  Neurological: He is alert. Coordination and gait abnormal.  Oriented to person, place, severe dysarthria limits communication   Right central VII  Motor 3- Right delt bi, tri, FF, FE 3- R HF, 4/5 Right Knee ext and ankle DF/PF      Lab Results Last 24 Hours  Results for orders placed or performed  during the hospital encounter of 05/08/19 (from the past 24 hour(s))  Glucose, capillary     Status: None    Collection Time: 05/10/19 11:45 AM  Result Value Ref Range    Glucose-Capillary 98 70 - 99 mg/dL    Comment 1 Notify RN      Comment 2 Document in Chart    Heparin level (unfractionated)     Status: None    Collection Time: 05/10/19  1:47 PM  Result Value Ref Range    Heparin Unfractionated 0.55 0.30 - 0.70 IU/mL  Glucose, capillary     Status: Abnormal    Collection Time: 05/10/19  3:38 PM  Result Value Ref Range    Glucose-Capillary 145 (H) 70 - 99 mg/dL    Comment 1 Notify RN      Comment 2 Document in Chart    Glucose, capillary     Status: Abnormal    Collection Time: 05/10/19  7:55 PM  Result Value Ref Range    Glucose-Capillary 111 (H) 70 - 99 mg/dL  Heparin level (unfractionated)     Status: None    Collection Time: 05/11/19  1:35 AM  Result Value Ref Range    Heparin Unfractionated 0.37 0.30 - 0.70 IU/mL  CBC with Differential     Status: Abnormal    Collection Time: 05/11/19  1:35 AM  Result Value Ref Range    WBC 14.8 (H) 4.0 - 10.5 K/uL    RBC 3.87 (L) 4.22 - 5.81 MIL/uL    Hemoglobin 11.1 (L) 13.0 - 17.0 g/dL    HCT 81.1 (L) 91.4 - 52.0 %    MCV 88.6 80.0 - 100.0 fL    MCH 28.7 26.0 - 34.0 pg    MCHC 32.4 30.0 - 36.0 g/dL    RDW 78.2 (H) 95.6 - 15.5 %    Platelets 294 150 - 400 K/uL    nRBC 0.0 0.0 - 0.2 %    Neutrophils Relative % 82 %    Neutro Abs 12.1 (H) 1.7 - 7.7 K/uL    Lymphocytes Relative 7 %    Lymphs Abs 1.1 0.7 - 4.0 K/uL    Monocytes Relative 8 %    Monocytes Absolute 1.2 (H) 0.1 - 1.0 K/uL    Eosinophils Relative 2 %    Eosinophils Absolute 0.3 0.0 - 0.5 K/uL    Basophils Relative 0 %    Basophils Absolute 0.0 0.0 - 0.1 K/uL    Immature Granulocytes 1 %    Abs Immature Granulocytes 0.16 (H) 0.00 - 0.07 K/uL  Magnesium     Status: None    Collection Time: 05/11/19  1:35 AM  Result Value Ref Range    Magnesium 1.9 1.7 - 2.4 mg/dL   Phosphorus     Status: None    Collection Time: 05/11/19  1:35 AM  Result Value Ref Range    Phosphorus 3.2 2.5 - 4.6 mg/dL  Glucose, capillary     Status: Abnormal    Collection Time: 05/11/19  8:21 AM  Result Value Ref Range    Glucose-Capillary 131 (H) 70 - 99 mg/dL    Comment 1 Notify RN      Comment 2 Document in Chart         Imaging Results (Last 48 hours)  DG Swallowing Func-Speech Pathology   Result Date: 05/10/2019 Objective Swallowing Evaluation: Type of Study: MBS-Modified Barium Swallow Study  Patient Details Name: Jon [name]  MRN: [id number] Date of Birth: Mar 09, 1966 Today's Date: 05/10/2019 Time: SLP Start Time (ACUTE ONLY): 1245 -SLP Stop Time (ACUTE ONLY): 1315 SLP Time Calculation (min) (ACUTE ONLY): 30 min Past Medical History: Past Medical History: Diagnosis Date . Aneurysm of thoracic aorta (HCC) 05/06/2012  IMO routine update IMO routine update . Anxiety   sees Dr. Renae Fickle . Aortic aneurysm and dissection (HCC) 05/06/2012 . Aortic stenosis   mechanical AVR 04/22/12 . Aortic valve disorder 05/06/2012 . 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 Bicuspid aortic valve  . Cerebral infarction (HCC) 05/11/2012 . Cerebral thrombosis with cerebral infarction (HCC) 07/14/2013 . Cholelithiasis 12/20/2016 . Current use of long term anticoagulation 12/20/2016 . CVA (cerebral infarction)   occipital CVA 12/13 . Dissecting aortic aneurysm (HCC) 05/06/2012 . Dysarthria 09/10/2017 . Dysrhythmia   sees Dr. Geronimo Running, [facility] cardiology in [place] . Generalized constriction of visual field 10/15/2012  IMOUPDATE IMOUPDATE . 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 GERD (gastroesophageal reflux disease)  . HCAP (healthcare-associated pneumonia) 05/06/2012 . Headache(784.0)   "due to vision problem" . History of CVA (cerebrovascular accident) 05/07/2012 . Hypothyroidism  . Idiopathic peripheral neuropathy 07/14/2013 . Late effects of CVA (cerebrovascular accident)  08/08/2015 . 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 Paroxysmal atrial fibrillation (HCC) 08/08/2015  On warfarin On warfarin . Presence of prosthetic heart valve 08/08/2015 . Shortness of breath  . Stroke (HCC)   x3 . Stroke, embolic (HCC) 05/07/2012 . Syncope 05/06/2012 . Thoracic aortic aneurysm (HCC)   replacement aortic root 04/22/12 . VBI (vertebrobasilar insufficiency) 09/10/2017 Past Surgical History: Past Surgical History: Procedure Laterality Date . APPLICATION OF WOUND VAC  06/11/2012  Procedure: APPLICATION OF WOUND VAC;  Surgeon: [name], MD;  Location: [facility] OR;  Service: Vascular;  Laterality: Right; . CARDIAC CATHETERIZATION   . CHOLECYSTECTOMY   . CORONARY ARTERY BYPASS GRAFT  04/22/2012  Procedure: CORONARY ARTERY BYPASS GRAFTING (CABG);  Surgeon: [name], MD;  Location: [facility] OR;  Service: Open Heart Surgery;  Laterality: N/A;  times one with right greater saphenous vein graft . I & D EXTREMITY  06/11/2012  Procedure: IRRIGATION AND DEBRIDEMENT EXTREMITY;  Surgeon: [name], MD;  Location: [facility] OR;  Service: Vascular;  Laterality: Right; . INTRAOPERATIVE TRANSESOPHAGEAL ECHOCARDIOGRAM  04/22/2012  Procedure: INTRAOPERATIVE TRANSESOPHAGEAL ECHOCARDIOGRAM;  Surgeon: [name], MD;  Location: [facility] OR;  Service: Open Heart Surgery;  Laterality: N/A; . IR CT HEAD LTD  05/08/2019 . IR PERCUTANEOUS ART THROMBECTOMY/INFUSION INTRACRANIAL INC DIAG ANGIO  05/08/2019 . IR US GUIDE VASC ACCESS RIGHT  05/08/2019 . ROTATOR CUFF REPAIR    12 years ago, Left . THORACIC AORTIC ANEURYSM REPAIR  04/22/2012  Procedure: THORACIC ASCENDING ANEURYSM REPAIR (AAA);  Surgeon: [name], MD;  Location: [facility] OR;  Service: Open Heart Surgery;  Laterality: N/A; HPI: 54 yo admitted with right weakness and left gaze with aphasia with acute left ACA anterior territory CVA s/p revascularization. VDRF 1/1-1/2. PMhx: dysarthria, aortic dissection s/p repair, AVR, GERD,  CVA x 3. Prior CVA resulted in verbosity, cognitive impairment including impulsivity, mild dysphagia. In 2014 MBS recommended regualr thin with a chin tuck.  No data recorded Assessment / Plan / Recommendation CHL IP CLINICAL IMPRESSIONS 05/10/2019 Clinical Impression Pt demonstrates a primary oral dysphagia with resulting pharyngeal deficits related to timing, but not strength. Pt has right labial and lingual weakness, and on fluro there is obviously decreased lingual coordination and velopharyngeal elevation and coordination. Due to weakness there is anterior spillage,  particularly at the moment of swallow initiation due to escape of pressure through the oral cavity. Pt requries intermittent explicit verbal cues for how to move his tongue to initiate oral transit which aide in more reflexive lingual pumping. Pumping does result in piecemeal, uncontrolled spillage with multiple reflexive swallow per bolus. Thin and nectar thick liquids spill and are aspirated before the swallow. Honey thick liquids as well even had come frank penetration with immediate ejection with a throat clear. A chin tuck helps increase time for swallow initaition and pt is familiar with this strategy from prior CVA. Study was quite limited as pt seemed very overwhelmed and uncomfortable though he did participate. Recommend pt initiate a puree dys 1 diet with honey thick liquids with a chin tuck and spoon feeding preferred at this time.  SLP Visit Diagnosis Dysphagia, oropharyngeal phase (R13.12) Attention and concentration deficit following -- Frontal lobe and executive function deficit following -- Impact on safety and function Moderate aspiration risk   CHL IP TREATMENT RECOMMENDATION 05/10/2019 Treatment Recommendations F/U MBS in --- days (Comment)   Prognosis 05/08/2012 Prognosis for Safe Diet Advancement Good Barriers to Reach Goals Cognitive deficits Barriers/Prognosis Comment -- CHL IP DIET RECOMMENDATION 05/10/2019 SLP Diet Recommendations  Dysphagia 1 (Puree) solids;Honey thick liquids Liquid Administration via -- Medication Administration -- Compensations Slow rate;Small sips/bites;Chin tuck Postural Changes Remain semi-upright after after feeds/meals (Comment)   CHL IP OTHER RECOMMENDATIONS 05/07/2012 Recommended Consults -- Oral Care Recommendations -- Other Recommendations Order thickener from pharmacy   CHL IP FOLLOW UP RECOMMENDATIONS 05/10/2019 Follow up Recommendations Inpatient Rehab   CHL IP FREQUENCY AND DURATION 05/10/2019 Speech Therapy Frequency (ACUTE ONLY) min 2x/week Treatment Duration --      CHL IP ORAL PHASE 05/10/2019 Oral Phase Impaired Oral - Pudding Teaspoon -- Oral - Pudding Cup -- Oral - Honey Teaspoon Decreased bolus cohesion;Delayed oral transit;Premature spillage;Lingual/palatal residue;Decreased velopharyngeal closure;Reduced posterior propulsion;Lingual pumping;Weak lingual manipulation Oral - Honey Cup -- Oral - Nectar Teaspoon Decreased bolus cohesion;Delayed oral transit;Premature spillage;Lingual/palatal residue;Decreased velopharyngeal closure;Reduced posterior propulsion;Lingual pumping;Weak lingual manipulation Oral - Nectar Cup -- Oral - Nectar Straw -- Oral - Thin Teaspoon -- Oral - Thin Cup Decreased bolus cohesion;Delayed oral transit;Premature spillage;Lingual/palatal residue;Decreased velopharyngeal closure;Reduced posterior propulsion;Lingual pumping;Weak lingual manipulation Oral - Thin Straw -- Oral - Puree Decreased bolus cohesion;Delayed oral transit;Premature spillage;Lingual/palatal residue;Decreased velopharyngeal closure;Reduced posterior propulsion;Lingual pumping;Weak lingual manipulation Oral - Mech Soft -- Oral - Regular -- Oral - Multi-Consistency -- Oral - Pill -- Oral Phase - Comment --  CHL IP PHARYNGEAL PHASE 05/10/2019 Pharyngeal Phase Impaired Pharyngeal- Pudding Teaspoon -- Pharyngeal -- Pharyngeal- Pudding Cup -- Pharyngeal -- Pharyngeal- Honey Teaspoon Delayed swallow  initiation-vallecula;Penetration/Aspiration before swallow Pharyngeal Material enters airway, passes BELOW cords then ejected out;Material does not enter airway Pharyngeal- Honey Cup -- Pharyngeal -- Pharyngeal- Nectar Teaspoon Delayed swallow initiation-pyriform sinuses;Penetration/Aspiration before swallow Pharyngeal Material enters airway, passes BELOW cords and not ejected out despite cough attempt by patient Pharyngeal- Nectar Cup -- Pharyngeal -- Pharyngeal- Nectar Straw -- Pharyngeal -- Pharyngeal- Thin Teaspoon Delayed swallow initiation-pyriform sinuses;Penetration/Aspiration before swallow Pharyngeal Material enters airway, passes BELOW cords and not ejected out despite cough attempt by patient Pharyngeal- Thin Cup -- Pharyngeal -- Pharyngeal- Thin Straw -- Pharyngeal -- Pharyngeal- Puree Delayed swallow initiation-vallecula Pharyngeal -- Pharyngeal- Mechanical Soft -- Pharyngeal -- Pharyngeal- Regular -- Pharyngeal -- Pharyngeal- Multi-consistency -- Pharyngeal -- Pharyngeal- Pill -- Pharyngeal -- Pharyngeal Comment --  No flowsheet data found. DeBlois, [name] Nearing 05/10/2019, 2:01 PM  ECHOCARDIOGRAM COMPLETE   Result Date: 05/09/2019   ECHOCARDIOGRAM REPORT   Patient Name:   Jon [name] Date of Exam: 05/09/2019 Medical Rec #:  [id number]           Height:       71.0 in Accession #:    [id number]          Weight:       233.7 lb Date of Birth:  [birth year]           BSA:          2.25 m Patient Age:    53 years            BP:           95/82 mmHg Patient Gender: M                   HR:           63 bpm. Exam Location:  Inpatient Procedure: 2D Echo Indications:    stroke 434.91  History:        Patient has prior history of Echocardiogram examinations, most                 recent 05/01/2018. Aortic dissection; Risk Factors:Current                 Smoker. Aortic Valve: A mechanical aortic valve prosthesis valve                 is present in the aortic position.  Sonographer:    Delcie Roch Referring Phys: [id number] ASHISH ARORA IMPRESSIONS  1. Left ventricular ejection fraction, by visual estimation, is 25 to 30%. The left ventricle has severely decreased function. There is no left ventricular hypertrophy.  2. Elevated left ventricular end-diastolic pressure.  3. Left ventricular diastolic parameters are consistent with Grade II diastolic dysfunction (pseudonormalization).  4. The left ventricle demonstrates global hypokinesis.  5. Global right ventricle has normal systolic function.The right ventricular size is normal. No increase in right ventricular wall thickness.  6. Left atrial size was normal.  7. Right atrial size was normal.  8. The mitral valve is normal in structure. Mild mitral valve regurgitation. No evidence of mitral stenosis.  9. The tricuspid valve is normal in structure. 10. Aortic valve regurgitation is not visualized. Mild aortic valve stenosis. 11. Mechanical prosthesis in the aortic valve position. 12. The pulmonic valve was normal in structure. Pulmonic valve regurgitation is not visualized. 13. Normal pulmonary artery systolic pressure. 14. The inferior vena cava is normal in size with greater than 50% respiratory variability, suggesting right atrial pressure of 3 mmHg. 15. When compared to the prior study from 05/01/2018 LVEF has decreased from 55-60% to 25-30% with diffuse hypokinesis. Transaortic gradients across the mechanical aortic valve are unchanged. FINDINGS  Left Ventricle: Left ventricular ejection fraction, by visual estimation, is 25 to 30%. The left ventricle has severely decreased function. The left ventricle demonstrates global hypokinesis. There is no left ventricular hypertrophy. Left ventricular diastolic parameters are consistent with Grade II diastolic dysfunction (pseudonormalization). Elevated left ventricular end-diastolic pressure. Right Ventricle: The right ventricular size is normal. No increase in right ventricular wall thickness. Global RV  systolic function is has normal systolic function. The tricuspid regurgitant velocity is 2.36 m/s, and with an assumed right atrial pressure  of 3 mmHg, the estimated right ventricular systolic pressure is normal at 25.3 mmHg. Left Atrium: Left atrial size was normal in size. Right Atrium: Right  atrial size was normal in size Pericardium: There is no evidence of pericardial effusion. Mitral Valve: The mitral valve is normal in structure. Mild mitral valve regurgitation. No evidence of mitral valve stenosis by observation. Tricuspid Valve: The tricuspid valve is normal in structure. Tricuspid valve regurgitation is mild. Aortic Valve: The aortic valve has been repaired/replaced. Aortic valve regurgitation is not visualized. Mild aortic stenosis is present. Aortic valve mean gradient measures 18.5 mmHg. Aortic valve peak gradient measures 34.5 mmHg. Aortic valve area, by VTI measures 1.81 cm. Mechanical aortic valve prosthesis valve is present in the aortic position. Pulmonic Valve: The pulmonic valve was normal in structure. Pulmonic valve regurgitation is not visualized. Pulmonic regurgitation is not visualized. Aorta: The aortic root, ascending aorta and aortic arch are all structurally normal, with no evidence of dilitation or obstruction. Venous: The inferior vena cava is normal in size with greater than 50% respiratory variability, suggesting right atrial pressure of 3 mmHg. IAS/Shunts: No atrial level shunt detected by color flow Doppler. There is no evidence of a patent foramen ovale. No ventricular septal defect is seen or detected. There is no evidence of an atrial septal defect.  LEFT VENTRICLE PLAX 2D LVIDd:         6.80 cm  Diastology LVIDs:         4.90 cm  LV e' lateral:   6.74 cm/s LV PW:         1.50 cm  LV E/e' lateral: 9.8 LV IVS:        1.10 cm  LV e' medial:    6.20 cm/s LVOT diam:     2.30 cm  LV E/e' medial:  10.7 LV SV:         126 ml LV SV Index:   54.12 LVOT Area:     4.15 cm  RIGHT  VENTRICLE RV S prime:     10.10 cm/s TAPSE (M-mode): 1.3 cm LEFT ATRIUM              Index       RIGHT ATRIUM           Index LA diam:        4.90 cm  2.18 cm/m  RA Area:     19.10 cm LA Vol (A2C):   104.0 ml 46.17 ml/m RA Volume:   50.30 ml  22.33 ml/m LA Vol (A4C):   88.6 ml  39.33 ml/m LA Biplane Vol: 97.9 ml  43.46 ml/m  AORTIC VALVE AV Area (Vmax):    1.86 cm AV Area (Vmean):   1.69 cm AV Area (VTI):     1.81 cm AV Vmax:           293.50 cm/s AV Vmean:          196.000 cm/s AV VTI:            0.535 m AV Peak Grad:      34.5 mmHg AV Mean Grad:      18.5 mmHg LVOT Vmax:         131.50 cm/s LVOT Vmean:        79.750 cm/s LVOT VTI:          0.233 m LVOT/AV VTI ratio: 0.44  AORTA Ao Root diam: 3.80 cm MITRAL VALVE                        TRICUSPID VALVE MV Area (PHT): 3.48 cm  TR Peak grad:   22.3 mmHg MV PHT:        63.22 msec           TR Vmax:        236.00 cm/s MV Decel Time: 218 msec MV E velocity: 66.10 cm/s 103 cm/s  SHUNTS MV A velocity: 47.10 cm/s 70.3 cm/s Systemic VTI:  0.23 m MV E/A ratio:  1.40       1.5       Systemic Diam: 2.30 cm  Tobias AlexanderKatarina Nelson MD Electronically signed by Tobias AlexanderKatarina Nelson MD Signature Date/Time: 05/09/2019/1:28:37 PM    Final          Assessment/Plan: Diagnosis: 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 Does the need for close, 24 hr/day medical supervision in concert with the patient's rehab needs make it unreasonable for this patient to be served in a less intensive setting? Yes 2. 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 Due to bladder management, bowel management, safety, skin/wound care, disease management, medication administration, pain management and patient education, does the patient require 24 hr/day rehab nursing? Yes 4. Does the patient require coordinated care of a physician, rehab  nurse, therapy disciplines of PT, OT, SLP to address physical and functional deficits in the context of the above medical diagnosis(es)? Yes Addressing deficits in the following areas: balance, endurance, locomotion, strength, transferring, bowel/bladder control, bathing, dressing, feeding, grooming, toileting, cognition, speech, language, swallowing and psychosocial support 5. Can the patient actively participate in an intensive therapy program of at least 3 hrs of therapy per day at least 5 days per week? Yes 6. The potential for patient to make measurable gains while on inpatient rehab is good 7. Anticipated functional outcomes upon discharge from inpatient rehab are supervision and min assist  with PT, supervision and min assist with OT, supervision and min assist with SLP. 8. Estimated rehab length of stay to reach the above functional goals is: 21-25d 9. Anticipated discharge destination: Home 10. Overall Rehab/Functional Prognosis: good   RECOMMENDATIONS: This patient's condition is appropriate for continued rehabilitative care in the following setting: CIR once off NE drip Patient has agreed to participate in recommended program. Potentially Note that insurance prior authorization may be required for reimbursement for recommended care.   Comment: needs to be off cleviprex and levophed with systolic BPs relatively controlled    Jacquelynn CreePamela S Love, PA-C    "I have personally performed a face to face diagnostic evaluation of this patient.  Additionally, I have reviewed and concur with the physician assistant's documentation above." Erick ColaceAndrew E. Kirsteins M.D. [facility] FAAPM&R ( Neuromuscular Med) Diplomate Am Board of Electrodiagnostic Med Fellow am Board of Interventional Pain med   05/11/2019        Revision History Date/Time User Provider Type Action  05/11/2019 11:32 AM Kirsteins, Victorino SparrowAndrew E, MD Physician Sign  05/11/2019  9:19 AM Love, Evlyn KannerPamela S, PA-C Physician Assistant  [place]   View Details Report     Routing History

## 2019-05-13 NOTE — Progress Notes (Signed)
Initial Nutrition Assessment  DOCUMENTATION CODES:   Obesity unspecified  INTERVENTION:   Tube feeding via Cortrak: - Osmolite 1.5 @ 60 ml/hr (1440 ml/day) - Pro-stat 30 ml BID - Free water per MD  Tube feeding regimen provides 2360 kcal, 120 grams of protein, and 1097 ml of H2O.   NUTRITION DIAGNOSIS:   Inadequate oral intake related to dysphagia as evidenced by NPO status.  GOAL:   Patient will meet greater than or equal to 90% of their needs  MONITOR:   Diet advancement, Labs, Weight trends, TF tolerance, Skin  REASON FOR ASSESSMENT:   Consult Enteral/tube feeding initiation and management  ASSESSMENT:   54 year old male who presented to the ED on 01/01 as a Code Stroke. PMH of prior stroke with residual dysarthria, type aortic dissection in 2014 with repair that was complicated and involved a brachiocephalic graft to the left carotid as the dissection extended into the brachiocephalic and left carotid, carotid, aortic stenosis status post mechanical valve, COPD.   01/01 - s/p attempted emergent mechanical thrombectomy, intubated 01/02 - extubated 01/03 - MBSS with recommendations for Dysphagia 1 and honey-thick liquids 01/05 - SLP recommending NPO, Cortrak placed (tip gastric per Cortrak team), TF initiated  RD consulted for TF initiation and management. TF protocol ordered 1/05.  Met with pt at bedside. Pt unable to communicate with RD.  Discussed TF with RN.  Medications reviewed and include: SSI q 4 hours, protonix, warfarin, IV abx, heparin  Labs reviewed: potassium 3.4 CBG's: 89-141 x 24 hours  UOP: 1000 ml x 24 hours  NUTRITION - FOCUSED PHYSICAL EXAM:    Most Recent Value  Orbital Region  No depletion  Upper Arm Region  No depletion  Thoracic and Lumbar Region  No depletion  Buccal Region  No depletion  Temple Region  No depletion  Clavicle Bone Region  No depletion  Clavicle and Acromion Bone Region  Mild depletion  Scapular Bone Region   Unable to assess  Dorsal Hand  No depletion  Patellar Region  Mild depletion  Anterior Thigh Region  Mild depletion  Posterior Calf Region  Mild depletion  Edema (RD Assessment)  None  Hair  Reviewed  Eyes  Reviewed  Mouth  Reviewed  Skin  Reviewed  Nails  Reviewed       Diet Order:   Diet Order            Diet NPO time specified  Diet effective now              EDUCATION NEEDS:   No education needs have been identified at this time  Skin:  Skin Assessment: Skin Integrity Issues: Incisions: right groin  Last BM:  no documented BM  Height:   Ht Readings from Last 1 Encounters:  05/08/19 5' 11"  (1.803 m)    Weight:   Wt Readings from Last 1 Encounters:  05/08/19 106 kg    Ideal Body Weight:  78.2 kg  BMI:  Body mass index is 32.59 kg/m.  Estimated Nutritional Needs:   Kcal:  5859-2924  Protein:  120-140 grams  Fluid:  >/= 2.0 L    Gaynell Face, MS, RD, LDN Inpatient Clinical Dietitian Pager: [phone] Weekend/After Hours: [phone]

## 2019-05-13 NOTE — IPOC Note (Signed)
Individualized overall Plan of Care [facility]) Patient Details Name: Jon [name] MRN: [id number] DOB: 1965-07-15  Admitting Diagnosis: Acute ischemic stroke [facility] [place])  Hospital Problems: Principal Problem:   Acute ischemic stroke (HCC) L MCA s/p attempted IR, embolic d/t PAF, low EF and subtherapeutic AC Active Problems:   Embolic stroke (HCC)   Acute on chronic combined systolic and diastolic congestive heart failure (HCC)   Hypoalbuminemia due to protein-calorie malnutrition (HCC)   Hyperglycemia   Subtherapeutic international normalized ratio (INR)     Functional Problem List: Nursing Pain, Bladder, Bowel, Perception, Safety, Edema, Sensory, Skin Integrity, Endurance, Medication Management, Nutrition  PT Balance, Endurance, Motor, Safety, Sensory, Perception  OT Balance, Safety, Sensory, Cognition, Vision, Endurance, Motor, Pain  SLP Cognition, Linguistic, Motor, Nutrition  TR         Basic ADL's: OT Grooming, Bathing, Dressing, Toileting     Advanced  ADL's: OT       Transfers: PT Bed Mobility, Bed to Chair, Car, Occupational psychologist, Research scientist (life sciences): PT Ambulation, Psychologist, prison and probation services, Stairs     Additional Impairments: OT Fuctional Use of Upper Extremity  SLP Swallowing, Communication, Social Cognition expression Problem Solving  TR      Anticipated Outcomes Item Anticipated Outcome  Self Feeding n/a at this time  Swallowing  Supervision   Basic self-care  supervision  Toileting  supervision   Bathroom Transfers supervision  Bowel/Bladder  Continent of B/B  Transfers  Supervision with LRAD  Locomotion  CGA with LRAD  Communication  Min A  Cognition  Supervision A  Pain  <3 on a pain scale of 0-10  Safety/Judgment  min assist with ambulation and transfers   Therapy Plan: PT Intensity: Minimum of 1-2 x/day ,45 to 90 minutes PT Frequency: 5 out of 7 days PT Duration Estimated Length of Stay: 25-28 days OT Intensity: Minimum of  1-2 x/day, 45 to 90 minutes OT Frequency: 5 out of 7 days OT Duration/Estimated Length of Stay: ~15-18 days SLP Intensity: Minumum of 1-2 x/day, 30 to 90 minutes SLP Frequency: 3 to 5 out of 7 days SLP Duration/Estimated Length of Stay: 3.5-4 weeks    Team Interventions: Nursing Interventions Patient/Family Education, Pain Management, Dysphagia/Aspiration Precaution Training, Bladder Management, Medication Management, Discharge Planning, Bowel Management, Skin Care/Wound Management, Psychosocial Support, Disease Management/Prevention  PT interventions Ambulation/gait training, Discharge planning, Functional mobility training, Therapeutic Activities, Balance/vestibular training, Disease management/prevention, Neuromuscular re-education, Therapeutic Exercise, Wheelchair propulsion/positioning, Cognitive remediation/compensation, DME/adaptive equipment instruction, Pain management, Splinting/orthotics, UE/LE Strength taining/ROM, Community reintegration, Development worker, international aid stimulation, Patient/family education, Museum/gallery curator, UE/LE Coordination activities  OT Interventions Warden/ranger, Discharge planning, Functional electrical stimulation, Pain management, Self Care/advanced ADL retraining, Therapeutic Activities, UE/LE Coordination activities, Cognitive remediation/compensation, Disease mangement/prevention, Functional mobility training, Patient/family education, Therapeutic Exercise, Visual/perceptual remediation/compensation, Community reintegration, Neuromuscular re-education, DME/adaptive equipment instruction, Psychosocial support, UE/LE Strength taining/ROM  SLP Interventions Cognitive remediation/compensation, Internal/external aids, Speech/Language facilitation, Cueing hierarchy, Dysphagia/aspiration precaution training, Functional tasks, Patient/family education, Multimodal communication approach  TR Interventions    SW/CM Interventions     Barriers to Discharge MD  Medical  stability, Weight, Nutritional means, and Severe dysarthria  Nursing Decreased caregiver support, Inaccessible home environment, Nutrition means, Incontinence, Medical stability, Home environment access/layout, Lack of/limited family support, Medication compliance    PT Medical stability Limited by dysarthria  OT      SLP      SW       Team Discharge Planning: Destination: PT-Home ,OT- Home , SLP-Home Projected Follow-up: PT-Home  health PT, OT-  None, SLP-Home Health SLP, 24 hour supervision/assistance Projected Equipment Needs: PT-To be determined, OT- To be determined, SLP-None recommended by SLP Equipment Details: PT-Has RW, single point cane, and manual WC, OT-  Patient/family involved in discharge planning: PT- Patient,  OT-Patient, SLP-Patient  MD ELOS: 22-26 days. Medical Rehab Prognosis:  Good Assessment: 54 year old male with history of CAF, CHB- s/p PPM, migraines, aortic dissection s/p AVR complicated by CVA with residual dysarthria, gait disorder and visual deficits (can't see below his waist per mother).  He presented on 05/08/2019 with sudden right-sided weakness and left gaze deviation.  CT head showed abnormal hypodensity left colossal body and possible pericallosal gyrus.  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  CTA chest showed diffuse lung opacity concerning for PNA and dissection or aneurysm .  He underwent cerebral angiogram with attempted thrombectomy of left A2 occlusion without significant improvement in flow and nonflow limiting dissection of distal cervical segment identified.  Coumadin was subtherapeutic at admission -- multiple attempts made to contact family for input and the reported patient had been recently discharged from hospital on Lovenox twice daily,  however however mother had not been administering injections.  Echocardiogram showed ejection fraction of 25-30%.  He was placed on IV heparin and neurology felt the stroke was embolic in setting of subtherapeutic INR.  He was placed on dysphagia 1 honey liquid diet with chin tuck due to dysphagia with aspiration of nectars on 01/03. Follow-up MRI showed multifocal restricted diffusion involving left frontoparietal lobes in both ACA and MCA territory as well as involvement of corpus callosum minor involvement of left occipital lobe question watershed and contralateral involvement of right anterolateral thalamus and right centrum ovale. He has had issues with hypotension requiring levophed off and on.  He had progressive leukocytosis with increasing cough and chest x-ray performed on 05/12/2019 personally reviewed, suggestive of aspiration pneumonia.  He was made n.p.o. with cortak for nutritional support. Therapy ongoing and patient showing ability to follow simple commands with expressive aphasia, dysarthria and poor safety awareness. Will set goals for Supervision with PT/OT and Supervision/Min A with SLP.   Due to the current state of emergency, patients may not be receiving their 3-hours of Medicare-mandated therapy.  See Team Conference Notes for weekly updates to the plan of care

## 2019-05-13 NOTE — Progress Notes (Signed)
Inpatient Rehabilitation-Admissions Coordinator   I have received medical clearance from attending service for admit to CIR today. I met with pt and his mom bedside to review rehab program details. Both the pt and his mother are on board to pursue CIR at this time. Insurance letter was provided and reviewed and the consent form signed.   AC will notify RN and [facility] team regarding plan for admit to CIR today.   Please call if questions.   Raechel Ache, OTR/L  Rehab Admissions Coordinator  [phone] 05/13/2019 12:20 PM

## 2019-05-14 ENCOUNTER — Inpatient Hospital Stay (HOSPITAL_COMMUNITY): Payer: Medicaid Other | Admitting: Speech Pathology

## 2019-05-14 ENCOUNTER — Inpatient Hospital Stay (HOSPITAL_COMMUNITY): Payer: Medicaid Other

## 2019-05-14 ENCOUNTER — Inpatient Hospital Stay (HOSPITAL_COMMUNITY): Payer: Medicaid Other | Admitting: Occupational Therapy

## 2019-05-14 DIAGNOSIS — E8809 Other disorders of plasma-protein metabolism, not elsewhere classified: Secondary | ICD-10-CM

## 2019-05-14 DIAGNOSIS — D72829 Elevated white blood cell count, unspecified: Secondary | ICD-10-CM

## 2019-05-14 DIAGNOSIS — R739 Hyperglycemia, unspecified: Secondary | ICD-10-CM

## 2019-05-14 DIAGNOSIS — E876 Hypokalemia: Secondary | ICD-10-CM

## 2019-05-14 DIAGNOSIS — I5043 Acute on chronic combined systolic (congestive) and diastolic (congestive) heart failure: Secondary | ICD-10-CM

## 2019-05-14 DIAGNOSIS — D62 Acute posthemorrhagic anemia: Secondary | ICD-10-CM

## 2019-05-14 DIAGNOSIS — E46 Unspecified protein-calorie malnutrition: Secondary | ICD-10-CM

## 2019-05-14 DIAGNOSIS — J690 Pneumonitis due to inhalation of food and vomit: Secondary | ICD-10-CM

## 2019-05-14 DIAGNOSIS — R791 Abnormal coagulation profile: Secondary | ICD-10-CM

## 2019-05-14 LAB — CBC WITH DIFFERENTIAL/PLATELET
Abs Immature Granulocytes: 0.12 10*3/uL — ABNORMAL HIGH (ref 0.00–0.07)
Basophils Absolute: 0 10*3/uL (ref 0.0–0.1)
Basophils Relative: 0 %
Eosinophils Absolute: 0.2 10*3/uL (ref 0.0–0.5)
Eosinophils Relative: 2 %
HCT: 24 % — ABNORMAL LOW (ref 39.0–52.0)
Hemoglobin: 7.7 g/dL — ABNORMAL LOW (ref 13.0–17.0)
Immature Granulocytes: 1 %
Lymphocytes Relative: 7 %
Lymphs Abs: 1 10*3/uL (ref 0.7–4.0)
MCH: 30 pg (ref 26.0–34.0)
MCHC: 32.1 g/dL (ref 30.0–36.0)
MCV: 93.4 fL (ref 80.0–100.0)
Monocytes Absolute: 1.2 10*3/uL — ABNORMAL HIGH (ref 0.1–1.0)
Monocytes Relative: 8 %
Neutro Abs: 11.6 10*3/uL — ABNORMAL HIGH (ref 1.7–7.7)
Neutrophils Relative %: 82 %
Platelets: 216 10*3/uL (ref 150–400)
RBC: 2.57 MIL/uL — ABNORMAL LOW (ref 4.22–5.81)
RDW: 17.4 % — ABNORMAL HIGH (ref 11.5–15.5)
WBC: 14.1 10*3/uL — ABNORMAL HIGH (ref 4.0–10.5)
nRBC: 0 % (ref 0.0–0.2)

## 2019-05-14 LAB — CBC
HCT: 31.6 % — ABNORMAL LOW (ref 39.0–52.0)
Hemoglobin: 10.4 g/dL — ABNORMAL LOW (ref 13.0–17.0)
MCH: 29.4 pg (ref 26.0–34.0)
MCHC: 32.9 g/dL (ref 30.0–36.0)
MCV: 89.3 fL (ref 80.0–100.0)
Platelets: 249 10*3/uL (ref 150–400)
RBC: 3.54 MIL/uL — ABNORMAL LOW (ref 4.22–5.81)
RDW: 16.8 % — ABNORMAL HIGH (ref 11.5–15.5)
WBC: 15.6 10*3/uL — ABNORMAL HIGH (ref 4.0–10.5)
nRBC: 0 % (ref 0.0–0.2)

## 2019-05-14 LAB — GLUCOSE, CAPILLARY
Glucose-Capillary: 101 mg/dL — ABNORMAL HIGH (ref 70–99)
Glucose-Capillary: 112 mg/dL — ABNORMAL HIGH (ref 70–99)
Glucose-Capillary: 132 mg/dL — ABNORMAL HIGH (ref 70–99)
Glucose-Capillary: 139 mg/dL — ABNORMAL HIGH (ref 70–99)
Glucose-Capillary: 146 mg/dL — ABNORMAL HIGH (ref 70–99)
Glucose-Capillary: 96 mg/dL (ref 70–99)
Glucose-Capillary: 99 mg/dL (ref 70–99)

## 2019-05-14 LAB — COMPREHENSIVE METABOLIC PANEL
ALT: 39 U/L (ref 0–44)
AST: 34 U/L (ref 15–41)
Albumin: 2.7 g/dL — ABNORMAL LOW (ref 3.5–5.0)
Alkaline Phosphatase: 114 U/L (ref 38–126)
Anion gap: 10 (ref 5–15)
BUN: 8 mg/dL (ref 6–20)
CO2: 24 mmol/L (ref 22–32)
Calcium: 8.6 mg/dL — ABNORMAL LOW (ref 8.9–10.3)
Chloride: 103 mmol/L (ref 98–111)
Creatinine, Ser: 0.79 mg/dL (ref 0.61–1.24)
GFR calc Af Amer: >60 mL/min (ref >60)
GFR calc non Af Amer: >60 mL/min (ref >60)
Glucose, Bld: 118 mg/dL — ABNORMAL HIGH (ref 70–99)
Potassium: 3.7 mmol/L (ref 3.5–5.1)
Sodium: 137 mmol/L (ref 135–145)
Total Bilirubin: 0.8 mg/dL (ref 0.3–1.2)
Total Protein: 6.2 g/dL — ABNORMAL LOW (ref 6.5–8.1)

## 2019-05-14 LAB — PROTIME-INR
INR: 1.6 — ABNORMAL HIGH (ref 0.8–1.2)
Prothrombin Time: 18.9 seconds — ABNORMAL HIGH (ref 11.4–15.2)

## 2019-05-14 LAB — MAGNESIUM: Magnesium: 2 mg/dL (ref 1.7–2.4)

## 2019-05-14 LAB — HEPARIN LEVEL (UNFRACTIONATED)
Heparin Unfractionated: 0.27 IU/mL — ABNORMAL LOW (ref 0.30–0.70)
Heparin Unfractionated: 0.52 IU/mL (ref 0.30–0.70)
Heparin Unfractionated: 0.48 IU/mL (ref 0.30–0.70)

## 2019-05-14 MED ORDER — SODIUM CHLORIDE 0.9 % IV SOLN: INTRAVENOUS | Status: DC | PRN

## 2019-05-14 MED ORDER — WARFARIN SODIUM 7.5 MG PO TABS
7.5000 mg | ORAL_TABLET | Freq: Once | ORAL | Status: AC
  Administered 2019-05-14: 7.5 mg via ORAL
  Filled 2019-05-14: qty 1

## 2019-05-14 MED ORDER — OSMOLITE 1.5 CAL PO LIQD
237.0000 mL | Freq: Every day | ORAL | Status: DC
  Administered 2019-05-14 – 2019-05-15 (×5): 237 mL

## 2019-05-14 MED ORDER — INSULIN ASPART 100 UNIT/ML ~~LOC~~ SOLN
0.0000 [IU] | SUBCUTANEOUS | Status: DC
  Administered 2019-05-14 – 2019-05-17 (×7): 1 [IU] via SUBCUTANEOUS
  Administered 2019-05-18: 5 [IU] via SUBCUTANEOUS
  Administered 2019-05-19: 1 [IU] via SUBCUTANEOUS
  Administered 2019-05-20: 2 [IU] via SUBCUTANEOUS
  Administered 2019-05-21 – 2019-05-22 (×3): 1 [IU] via SUBCUTANEOUS
  Administered 2019-05-23: 2 [IU] via SUBCUTANEOUS
  Administered 2019-05-23 – 2019-05-24 (×4): 1 [IU] via SUBCUTANEOUS
  Administered 2019-05-24: 5 [IU] via SUBCUTANEOUS
  Administered 2019-05-24 – 2019-06-02 (×16): 1 [IU] via SUBCUTANEOUS
  Administered 2019-06-03: 2 [IU] via SUBCUTANEOUS
  Administered 2019-06-04 (×2): 1 [IU] via SUBCUTANEOUS

## 2019-05-14 NOTE — Progress Notes (Signed)
[place] PHYSICAL MEDICINE & REHABILITATION PROGRESS NOTE  Subjective/Complaints: Patient seen laying in bed this morning.  He appears to indicate that he slept well overnight.  He appears to give appropriate "thumbs up".  ROS: Appears to deny CP, shortness of breath, nausea, vomiting, diarrhea.  Objective: Vital Signs: Blood pressure 106/69, pulse 63, temperature 98.3 F (36.8 C), resp. rate 16, height 5\' 11"  (1.803 m), weight 106 kg, SpO2 97 %. DG CHEST PORT 1 VIEW  Result Date: 05/12/2019 CLINICAL DATA:  Aspiration into airway. EXAM: PORTABLE CHEST 1 VIEW COMPARISON:  Chest radiograph 05/09/2019 FINDINGS: Unchanged cardiomegaly. Aortic atherosclerosis. Redemonstrated left chest dual lead pacer. Prior median sternotomy with cardiac valve prosthesis. Shallow inspiration radiograph. Similar appearance of interstitial and ill-defined opacity throughout the left lung. No sizable pleural effusion or evidence of pneumothorax. No acute bony abnormality. IMPRESSION: Shallow inspiration radiograph. Similar appearance of interstitial and ill-defined opacities throughout the left lung. Findings are suspicious for pneumonia given provided history. Asymmetric edema is also a consideration. Electronically Signed   By: Kellie Simmering DO   On: 05/12/2019 10:59   Recent Labs    05/13/19 0330 05/14/19 0118  WBC 17.8* 14.1*  HGB 11.4* 7.7*  HCT 36.0* 24.0*  PLT 263 216   Recent Labs    05/13/19 0330 05/14/19 0349  NA 138 137  K 3.4* 3.7  CL 103 103  CO2 25 24  GLUCOSE 132* 118*  BUN 7 8  CREATININE 0.79 0.79  CALCIUM 8.5* 8.6*    Physical Exam: BP 106/69 (BP Location: Left Arm)   Pulse 63   Temp 98.3 F (36.8 C)   Resp 16   Ht 5\' 11"  (1.803 m)   Wt 106 kg   SpO2 97%   BMI 32.59 kg/m  Constitutional: No distress . Vital signs reviewed.  Obese. HENT: Normocephalic.  Atraumatic.  + NG. Eyes: EOMI. No discharge. Cardiovascular: No JVD. Respiratory: Normal effort.  + [place].  + Rales. GI:  Non-distended. Skin: Warm and dry.  Intact. Psych: Unable to assess due to dysarthria Musc: No edema in extremities.  No tenderness in extremities. Neurological: Alert Severe dysarthria Able to follow simple motor commands.  Motor: LUE/LLE: 4+/5 proximal distal RUE: 3/5 proximal distal RLE: Hip flexion, knee extension 3/5, dorsiflexion 0/5   Assessment/Plan: 1. 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  Physiatrist is providing close team supervision and 24 hour management of active medical problems listed below.  Physiatrist and rehab team continue to assess barriers to discharge/monitor patient progress toward functional and medical goals  Care Tool:  Bathing              Bathing assist Assist Level: Total Assistance - Patient < 25%     Upper Body Dressing/Undressing Upper body dressing        Upper body assist      Lower Body Dressing/Undressing Lower body dressing            Lower body assist       Toileting Toileting    Toileting assist Assist for toileting: Total Assistance - Patient < 25%     Transfers Chair/bed transfer  Transfers assist  Chair/bed transfer activity did not occur: N/A        Locomotion Ambulation   Ambulation assist              Walk 10 feet activity  Assist           Walk 50 feet activity   Assist           Walk 150 feet activity   Assist           Walk 10 feet on uneven surface  activity   Assist           Wheelchair     Assist               Wheelchair 50 feet with 2 turns activity    Assist            Wheelchair 150 feet activity     Assist            Medical Problem List and Plan: 1.  Expressive aphasia, dysarthria, poor safety awareness, limitations in self-care, dysphagia secondary to bilateral CVA, left  greater than right.  Begin CIR evaluations 2.  Antithrombotics: -DVT/anticoagulation:  Pharmaceutical:Heparin GGT bridge to Coumadin.  INR remains subtherapeutic unfractionated heparin level as well on 1/7             -antiplatelet therapy: on ASA 3. Pain Management: tylenol prn 4. Mood: LCSW to follow for evaluation and support when appropriate.              -antipsychotic agents: N/A 5. Neuropsych: This patient is not fully capable of making decisions on his own behalf. 6. Skin/Wound Care: Routine pressure relief measures.  7. Fluids/Electrolytes/Nutrition: NPO. Continue tube feeds, added water flushes.  8. Aspiration PNA: On Unasyn day #3/7. Needs to be upright with pulmonary toilet.    Wean supplemental oxygen as tolerated  9. CAF/CHB s/p PPM:   Monitor HR Off amiodarone and Entresto due to issues with hypotension.  11 COPD/OSA: Duonebs and incruse resumed. Encourage IS and keep HOB>30 degrees.  12. Depression/anxiety disorder: Emotional support 13. H/o migraines: Has been managed on Topamax and gabapentin.  14. Hypokalemia:   Continue Kdur   Potassium 3.7 on 1/7  Continue to monitor 15.  Hyperglycemia-secondary to tube feeds  Continue to monitor 16.  Leukocytosis  WBCs 14.1 on 1/7  Afebrile  Continue to monitor 17.  Acute blood loss anemia  Significant drop in hemoglobin,?  Dilutional, however given heparin/Coumadin will repeat stat lab 18.  Hypoalbuminemia  Adjust with tube feeds 19.  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

## 2019-05-14 NOTE — Progress Notes (Signed)
ANTICOAGULATION CONSULT NOTE  Pharmacy Consult for Heparin Indication: stroke and mechanical valve, atrial fibrillation  Allergies  Allergen Reactions  . Hydrocodone-Acetaminophen Other (See Comments)    PT MOTHER DOES NOT REMEMBER REACTION  Only use in Emergency   . Hydromorphone Itching and Rash       . Other Other (See Comments)    ALL NARCOTICS-ITCHING, RASH   . Oxycodone Other (See Comments)    Unknown reaction  . Penicillins Rash    Did it involve swelling of the face/tongue/throat, SOB, or low BP? Unknown Did it involve sudden or severe rash/hives, skin peeling, or any reaction on the inside of your mouth or nose? Unknown Did you need to seek medical attention at a hospital or doctor's office? Unknown When did it last happen?unknown If all above answers are "NO", may proceed with cephalosporin use.  . Dilaudid [Hydromorphone Hcl] Itching and Rash    Patient Measurements: Total Body Weight: 106 kg Height: 71 inches Heparin Dosing Weight: 97.7kg  Vital Signs: Temp: 98.3 F (36.8 C) (01/07 0446) BP: 106/69 (01/07 0446) Pulse Rate: 70 (01/07 0900)  Labs: Recent Labs    05/12/19 0716 05/13/19 0330 05/13/19 1755 05/14/19 0118 05/14/19 0349 05/14/19 0842  HGB 11.0* 11.4*  --  7.7*  --  10.4*  HCT 34.0* 36.0*  --  24.0*  --  31.6*  PLT 292 263  --  216  --  249  LABPROT 15.6* 14.1  --  18.9*  --   --   INR 1.3* 1.1  --  1.6*  --   --   HEPARINUNFRC 0.35 0.21* 0.26* 0.27*  --  0.52  CREATININE 0.74 0.79  --   --  0.79  --     Estimated Creatinine Clearance: 132.3 mL/min (by C-G formula based on SCr of 0.79 mg/dL).   Assessment: 54 yr old male presented to the ED with stroke symptoms after recent discharge from [facility]. He is supposed to be on warfarin and Lovenox for history of a mechanical valve.  Patient is S/P thrombectomy and continues on IV heparin with warfarin resumed on 1/4. Pharmacy consulted to dose.   Heparin level this morning is  slightly SUPRAtherapeutic of lower goal range (HL 0.52, goal of 0.3-0.5) after dose increase earlier today INR remains SUBtherapeutic but increasing and up to 1.6 today. Hgb 10.4, plts wnl.   Home Coumadin regimen: 5mg  alternating with 2.5mg  daily  Goal of Therapy:  Heparin level 0.3-0.5 units/ml INR 2.5-3.5  Monitor platelets by anticoagulation protocol: Yes   Plan:  - Reduce Heparin drip rate slightly to 1700 units/hr (17 mlhr) - Repeat Warfarin 7.5 mg x 1 dose at 1800 today - Will continue to monitor for any signs/symptoms of bleeding and will follow up with heparin level in 6 hours and PT/INR in the AM  Thank you for allowing pharmacy to be a part of this patient's care.  , PharmD, BCPS Clinical Pharmacist Clinical phone for 05/14/2019: 07/12/2019 05/14/2019 10:20 AM   **Pharmacist phone directory can now be found on [email or web address] ([email or web address]).  Listed under [facility].

## 2019-05-14 NOTE — Evaluation (Signed)
Speech Language Pathology Assessment and Plan  Patient Details  Name: Jon [name] MRN: [id number] Date of Birth: October 07, 1965  SLP Diagnosis: Dysarthria;Cognitive Impairments;Dysphagia  Rehab Potential: Kermit Balo ELOS: 3.5-4 weeks    Today's Date: 05/14/2019 SLP Individual Time: 1761-6073 SLP Individual Time Calculation (min): 55 min   Problem List:  Patient Active Problem List   Diagnosis Date Noted  . Acute on chronic combined systolic and diastolic congestive heart failure ([place])   . Hypoalbuminemia due to protein-calorie malnutrition ([place])   . Hyperglycemia   . Subtherapeutic international normalized ratio (INR)   . Embolic stroke ([place]) 71/10/2692  . Dysphagia, post-stroke   . Migraine without status migrainosus, not intractable   . Anxiety and depression   . Chronic obstructive pulmonary disease ([place])   . OSA (obstructive sleep apnea)   . Chronic atrial fibrillation ([place])   . Hypokalemia   . Essential hypertension 05/12/2019  . Hyperlipidemia 05/12/2019  . Diabetes mellitus type II, controlled ([place]) 05/12/2019  . Dysphagia 05/12/2019  . Aspiration pneumonia ([place]) 05/12/2019  . Leukocytosis 05/12/2019  . Cigarette smoker 05/12/2019  . Obesity 05/12/2019  . Family hx-stroke 05/12/2019  . Acute blood loss anemia 05/12/2019  . Stroke (cerebrum) ([place]) 05/08/2019  . Acute ischemic stroke ([place]) L MCA s/p attempted IR, embolic d/t PAF, low EF and subtherapeutic AC 05/08/2019  . Status post aortic valve replacement 02/23/2019  . Pacemaker [place] Jude device implanted in summer 2020 02/23/2019  . Erectile dysfunction 12/10/2017  . Nontraumatic multiple localized intracerebral hemorrhages ([place]) 10/16/2017  . Dysarthria 09/10/2017  . VBI (vertebrobasilar insufficiency) 09/10/2017  . Cholelithiasis 12/20/2016  . Current use of long term anticoagulation 12/20/2016  . Late effects of CVA (cerebrovascular accident) 08/08/2015  . Paroxysmal atrial fibrillation ([place]) 08/08/2015  .  Presence of prosthetic heart valve 08/08/2015  . 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 Idiopathic peripheral neuropathy 07/14/2013  . Cerebral thrombosis with cerebral infarction ([place]) 07/14/2013  . Generalized constriction of visual field 10/15/2012  . Seroma, postoperative 06/13/2012  . CVA (cerebral infarction) 05/11/2012  . Cerebral infarction ([place]) 05/11/2012  . Stroke, embolic ([place]) 85/46/2703    Class: Acute  . History of CVA (cerebrovascular accident) 05/07/2012  . HCAP (healthcare-associated pneumonia) 05/06/2012  . Syncope 05/06/2012  . Anxiety 05/06/2012  . Aortic stenosis 05/06/2012  . Aortic aneurysm and dissection ([place]) 05/06/2012  . Thoracic aortic aneurysm ([place]) 05/06/2012  . Aortic valve disorder 05/06/2012  . 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 Aneurysm of thoracic aorta ([place]) 05/06/2012   Past Medical History:  Past Medical History:  Diagnosis Date  . Aneurysm of thoracic aorta ([place]) 05/06/2012   IMO routine update IMO routine update  . Anxiety    sees Dr. Wende Neighbors  . Aortic aneurysm and dissection ([place]) 05/06/2012  . Aortic stenosis    mechanical AVR 04/22/12  . Aortic valve disorder 05/06/2012  . 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 Bicuspid aortic valve   . Cerebral infarction ([place]) 05/11/2012  . Cerebral thrombosis with cerebral infarction ([place]) 07/14/2013  . Cholelithiasis 12/20/2016  . Current use of long term anticoagulation 12/20/2016  . CVA (cerebral infarction)    occipital CVA 12/13  . 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) 05/06/2012  . Dysarthria 09/10/2017  . Dysrhythmia    sees Dr. Leslie Dales, [facility] cardiology in [place]  . Generalized constriction of visual field 10/15/2012   IMOUPDATE IMOUPDATE  . Generalized ischemic  cerebrovascular disease 07/14/2013  . GERD (gastroesophageal reflux disease)   . HCAP  (healthcare-associated pneumonia) 05/06/2012  . Headache(784.0)    "due to vision problem"  . History of CVA (cerebrovascular accident) 05/07/2012  . Hypothyroidism   . Idiopathic peripheral neuropathy 07/14/2013  . Late effects of CVA (cerebrovascular accident) 08/08/2015  . Migraine   . Nontraumatic multiple localized intracerebral hemorrhages ([place]) 10/16/2017  . Paroxysmal atrial fibrillation ([place]) 08/08/2015   On warfarin On warfarin  . Presence of prosthetic heart valve 08/08/2015  . Shortness of breath   . Stroke ([place])    x3  . Stroke, embolic ([place]) 10/06/3760  . Syncope 05/06/2012  . Thoracic aortic aneurysm ([place])    replacement aortic root 04/22/12  . VBI (vertebrobasilar insufficiency) 09/10/2017   Past Surgical History:  Past Surgical History:  Procedure Laterality Date  . APPLICATION OF WOUND VAC  06/11/2012   Procedure: APPLICATION OF WOUND VAC;  Surgeon: Melrose Nakayama, MD;  Location: [facility];  Service: Vascular;  Laterality: Right;  . CARDIAC CATHETERIZATION    . CHOLECYSTECTOMY    . CORONARY ARTERY BYPASS GRAFT  04/22/2012   Procedure: CORONARY ARTERY BYPASS GRAFTING (CABG);  Surgeon: Melrose Nakayama, MD;  Location: [facility];  Service: Open Heart Surgery;  Laterality: N/A;  times one with right greater saphenous vein graft  . I & D EXTREMITY  06/11/2012   Procedure: IRRIGATION AND DEBRIDEMENT EXTREMITY;  Surgeon: Melrose Nakayama, MD;  Location: [facility];  Service: Vascular;  Laterality: Right;  . INTRAOPERATIVE TRANSESOPHAGEAL ECHOCARDIOGRAM  04/22/2012   Procedure: INTRAOPERATIVE TRANSESOPHAGEAL ECHOCARDIOGRAM;  Surgeon: Melrose Nakayama, MD;  Location: [facility];  Service: Open Heart Surgery;  Laterality: N/A;  . IR CT HEAD LTD  05/08/2019  . IR PERCUTANEOUS ART THROMBECTOMY/INFUSION INTRACRANIAL INC DIAG ANGIO  05/08/2019  . IR US GUIDE VASC ACCESS RIGHT  05/08/2019  . PERMANENT PACEMAKER INSERTION  10/2018   [employer] Jude's dual PPM  . RADIOLOGY WITH ANESTHESIA N/A 05/08/2019    Procedure: IR WITH ANESTHESIA;  Surgeon: Radiologist, Medication, MD;  Location: [facility];  Service: Radiology;  Laterality: N/A;  . ROTATOR CUFF REPAIR     12 years ago, Left  . THORACIC AORTIC ANEURYSM REPAIR  04/22/2012   Procedure: THORACIC ASCENDING ANEURYSM REPAIR (AAA);  Surgeon: Melrose Nakayama, MD;  Location: [facility];  Service: Open Heart Surgery;  Laterality: N/A;    Assessment / Plan / Recommendation Clinical Impression   HPI: MITSURU DAULT is a 54 year old male with history of CAF, CHB- s/p PPM, migraines, aortic dissection s/p AVR complicated by CVA with residual dysarthria, gait disorder and visual deficits (can't see below his waist per mother).  He presented on 05/08/2019 with sudden right-sided weakness and left gaze deviation.  History taken from chart review due to severe dysarthria.  CT head showed abnormal hypodensity left colossal body and possible pericallosal gyrus.  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  CTA chest showed diffuse lung opacity concerning for PNA and dissection or aneurysm .  He underwent cerebral angiogram with attempted thrombectomy of left A2 occlusion without significant improvement in flow and nonflow limiting dissection of distal cervical segment identified.  Coumadin was subtherapeutic at admission -- multiple attempts made to contact family for input and the reported patient had been recently discharged from  hospital on Lovenox twice daily, however however mother had not been administering injections.  Echocardiogram showed ejection fraction of 25-30%.  He was placed on IV heparin and neurology felt the stroke was embolic in setting of subtherapeutic INR.  He was placed on dysphagia 1 honey liquid diet with chin tuck due to dysphagia with aspiration of  nectars on 01/03.  Follow-up MRI showed multifocal restricted diffusion involving left frontoparietal lobes in both ACA and MCA territory as well as involvement of corpus callosum minor involvement of left occipital lobe question watershed and contralateral involvement of right anterolateral thalamus and right centrum ovale. He has had issues with hypotension requiring levophed off and on.  He had progressive leukocytosis with increasing cough and chest x-ray performed on 05/12/2019 personally reviewed, suggestive of aspiration pneumonia.  He was made n.p.o. with cortak for nutritional support. Therapy ongoing and patient showing ability to follow simple commands with expressive aphasia, dysarthria and poor safety awareness. Pt admitted to CIR 05/13/19 and SLP evaluation was completed 05/14/19 with results as follows:  Bedside swallow evaluation was deferred at request of medical team due to current PNA dx likely s/p aspiration. Will continue to collaborate with team and review chest xrays as well as bedside observations to determine readiness for PO trials. Pt will need follow up MBSS prior to diet initiation to determine least restrictive diet. Pt currently has Cortrak for temporary nutrition, continue NPO until medically cleared to participate in MBSS.  Pt has history of CVAs and cognitive impairment, primarily marked by impulsivity, per chart review. No family was present to confirm. Pt did display instances of mild impulsivity and decreased problem solving during administration of select subtests from the [facility] as well as sequencing during functional tasks. Pt's expressive/receptive language seemingly in tact, as he answered yes/no questions with 100% accuracy, followed 1 and 2-step directions, and named common items in the room and from line drawings with 95% accuracy. Pt was oriented X4 and self-reported changes in mental status and speech new to this admission. Pt has very severe dysarthria which makes  him highly unintelligible (25% or less when context is unknown). When context is know, intelligibility increases to 50-70% at word level, 40-50% at phrase level. Pt uses slow rate to compensate for decreased intelligibility well, and responds to cues to implement overarticualtion and pacing (breaking messages into shorter phrases with pauses inbetween) to increase clarity. Due to severity of dysarthria, SLP also provided a picture and letter communication board, which pt demonstrated ability to express basic wants and needs, use pain scale, and spell messages on the letter board portion with overall Min A.   Recommend pt receive skilled ST services for dysphagia, dysarthria, and cognitive impairments detailed above in order to ensure diet safety and efficiency, effective/functional communication, and maximize independence upon d/c.    Skilled Therapeutic Interventions          Cognitive-linguistic evaluation was completed and results were reviewed with pt (see above for details). SLP also provided skilled intervention with Max A verbal cues for implementation of overarticulation and pacing (breaking messages into short phrases) to increase intelligibility during word and phrase level tasks presented. Also provided Min A verbal/visual cues to facilitate use of picture and letter communication board.    SLP Assessment  Patient will need skilled Speech Lanaguage Pathology Services during CIR admission    Recommendations  SLP Diet Recommendations: NPO;Alternative means - temporary Medication Administration: Via alternative means Oral Care Recommendations: Oral care QID Patient destination: Home Follow up  Recommendations: Home Health SLP;24 hour supervision/assistance Equipment Recommended: None recommended by SLP    SLP Frequency 3 to 5 out of 7 days   SLP Duration  SLP Intensity  SLP Treatment/Interventions 3.5-4 weeks  Minumum of 1-2 x/day, 30 to 90 minutes  Cognitive  remediation/compensation;Internal/external aids;Speech/Language facilitation;Cueing hierarchy;Dysphagia/aspiration precaution training;Functional tasks;Patient/family education;Multimodal communication approach    Pain Pain Assessment Pain Scale: 0-10 Pain Score: 0-No pain     SLP Evaluation Cognition Overall Cognitive Status: History of cognitive impairments - at baseline(per chart review) Orientation Level: Oriented X4 Attention: Sustained Sustained Attention: Appears intact Memory: Appears intact(difficult to fully assess due to severe dysarthria, but appeared in tact. Will continue to assess in dx tx.) Awareness: Appears intact Problem Solving: Impaired Problem Solving Impairment: Functional basic;Verbal basic Executive Function: Sequencing Sequencing: Impaired Sequencing Impairment: Functional basic Safety/Judgment: Impaired Comments: slightly impulsive  Comprehension Auditory Comprehension Overall Auditory Comprehension: Appears within functional limits for tasks assessed Yes/No Questions: Within Functional Limits Commands: Within Functional Limits Conversation: Complex Visual Recognition/Discrimination Discrimination: Within Function Limits Reading Comprehension Reading Status: Within funtional limits Expression Expression Primary Mode of Expression: Verbal Verbal Expression Overall Verbal Expression: Appears within functional limits for tasks assessed Initiation: No impairment Level of Generative/Spontaneous Verbalization: Sentence Repetition: No impairment Naming: No impairment Pragmatics: No impairment Non-Verbal Means of Communication: Communication board Other Verbal Expression Comments: expressive/receptive language appears in tact, however he can use communication board (pictures and letters for spelling) due to very severe dysarthria Written Expression Written Expression: Not tested Oral Motor Oral Motor/Sensory Function Overall Oral Motor/Sensory  Function: Severe impairment Facial ROM: Reduced right;Suspected CN VII (facial) dysfunction Facial Symmetry: Abnormal symmetry right;Suspected CN VII (facial) dysfunction Facial Strength: Reduced right;Suspected CN VII (facial) dysfunction Facial Sensation: Within Functional Limits Lingual ROM: Reduced right;Suspected CN XII (hypoglossal) dysfunction Lingual Symmetry: Abnormal symmetry right;Suspected CN XII (hypoglossal) dysfunction Lingual Strength: Reduced Mandible: Within Functional Limits Motor Speech Overall Motor Speech: Impaired Respiration: Within functional limits Phonation: Normal Resonance: Hypernasality Articulation: Impaired Level of Impairment: Word Intelligibility: Intelligibility reduced Word: 50-74% accurate Phrase: 25-49% accurate Sentence: 25-49% accurate Conversation: 0-24% accurate Motor Planning: Impaired Level of Impairment: Word Motor Speech Errors: Aware;Consistent Effective Techniques: Slow rate;Over-articulate;Pacing(speaking in shorter phrases)   Intelligibility: Intelligibility reduced Word: 50-74% accurate Phrase: 25-49% accurate Sentence: 25-49% accurate Conversation: 0-24% accurate  Bedside Swallowing Assessment Deferred at request of medical team due to current PNA dx likely s/p aspiration. Will continue to collaborate with team and review chest xrays as well as bedside observations to determine readiness for PO trials. Pt will need follow up MBSS prior to diet initiation to determine least restrictive diet.    Short Term Goals: Week 1: SLP Short Term Goal 1 (Week 1): Following improved chest x-rays and medical clearance, pt will particiate in instrumental swallow study to determine least restrictive diet. SLP Short Term Goal 2 (Week 1): Pt will utilize speech intelligibility strategies to increase intelligibility to 60% at the word level with Max A verbal cues. SLP Short Term Goal 3 (Week 1): Pt will initiate use of picture and/or letter  communication board to communicate complex messages when he is not understood by staff in 3/5 opportunities with Min A cues. SLP Short Term Goal 4 (Week 1): Pt will demonstrate basic problem solving during functional tasks with Mod A verbal/visual cues.  Refer to Care Plan for Long Term Goals  Recommendations for other services: None   Discharge Criteria: Patient will be discharged from SLP if patient refuses treatment 3 consecutive times without medical  reason, if treatment goals not met, if there is a change in medical status, if patient makes no progress towards goals or if patient is discharged from hospital.  The above assessment, treatment plan, treatment alternatives and goals were discussed and mutually agreed upon: by patient  Arbutus Leas 05/14/2019, 12:23 PM

## 2019-05-14 NOTE — Progress Notes (Signed)
ANTICOAGULATION CONSULT NOTE - Follow Up Consult  Pharmacy Consult for Heparin Indication: Afib + mech AVR, New CVA  Allergies  Allergen Reactions  . Hydrocodone-Acetaminophen Other (See Comments)    PT MOTHER DOES NOT REMEMBER REACTION  Only use in Emergency   . Hydromorphone Itching and Rash       . Other Other (See Comments)    ALL NARCOTICS-ITCHING, RASH   . Oxycodone Other (See Comments)    Unknown reaction  . Penicillins Rash    Did it involve swelling of the face/tongue/throat, SOB, or low BP? Unknown Did it involve sudden or severe rash/hives, skin peeling, or any reaction on the inside of your mouth or nose? Unknown Did you need to seek medical attention at a hospital or doctor's office? Unknown When did it last happen?unknown If all above answers are "NO", may proceed with cephalosporin use.  . Dilaudid [Hydromorphone Hcl] Itching and Rash    Patient Measurements: Height: 5\' 11"  (180.3 cm) Weight: 233 lb 11 oz (106 kg) IBW/kg (Calculated) : 75.3 Heparin Dosing Weight:    Vital Signs: Temp: 97.7 F (36.5 C) (01/07 1433) BP: 109/64 (01/07 1433) Pulse Rate: 68 (01/07 1455)  Labs: Recent Labs    05/12/19 0716 05/13/19 0330 05/14/19 0118 05/14/19 0349 05/14/19 0842 05/14/19 1738  HGB 11.0* 11.4* 7.7*  --  10.4*  --   HCT 34.0* 36.0* 24.0*  --  31.6*  --   PLT 292 263 216  --  249  --   LABPROT 15.6* 14.1 18.9*  --   --   --   INR 1.3* 1.1 1.6*  --   --   --   HEPARINUNFRC 0.35 0.21* 0.27*  --  0.52 0.48  CREATININE 0.74 0.79  --  0.79  --   --     Estimated Creatinine Clearance: 132.3 mL/min (by C-G formula based on SCr of 0.79 mg/dL).   Assessment: Anticoag: Coumadin  PTA for hx Afib + mech AVR, New CVA.  Discharged from [facility], received Lovenox 12/31 PM (family was not giving Coumadin) >> started Heparin, restart Coumadin 1/4. HL 0.48 now in goal.   Goal of Therapy:  Heparin level 0.3-0.5 units/ml Monitor platelets by anticoagulation  protocol: Yes   Plan:  Con't Heparin drip at 1700 units/hr (17 ml/hr) Daily HL and CBC   Jon Lama S. 1/32, PharmD, BCPS Clinical Staff Pharmacist [email or web address] Jon [name], Jon [name] 05/14/2019,6:56 PM

## 2019-05-14 NOTE — Evaluation (Signed)
Occupational Therapy Assessment and Plan  Patient Details  Name: Jon [name] MRN: [id number] Date of Birth: [birth year]  OT Diagnosis: cognitive deficits, disturbance of vision, hemiplegia affecting dominant side and muscle weakness (generalized) Rehab Potential: Rehab Potential (ACUTE ONLY): Good ELOS: ~15-18 days   Today's Date: 05/14/2019 OT Individual Time: 1305-1400 OT Individual Time Calculation (min): 55 min     Problem List:  Patient Active Problem List   Diagnosis Date Noted  . Acute on chronic combined systolic and diastolic congestive heart failure ([place])   . Hypoalbuminemia due to protein-calorie malnutrition ([place])   . Hyperglycemia   . Subtherapeutic international normalized ratio (INR)   . Embolic stroke ([place]) 40/98/1191  . Dysphagia, post-stroke   . Migraine without status migrainosus, not intractable   . Anxiety and depression   . Chronic obstructive pulmonary disease ([place])   . OSA (obstructive sleep apnea)   . Chronic atrial fibrillation ([place])   . Hypokalemia   . Essential hypertension 05/12/2019  . Hyperlipidemia 05/12/2019  . Diabetes mellitus type II, controlled ([place]) 05/12/2019  . Dysphagia 05/12/2019  . Aspiration pneumonia ([place]) 05/12/2019  . Leukocytosis 05/12/2019  . Cigarette smoker 05/12/2019  . Obesity 05/12/2019  . Family hx-stroke 05/12/2019  . Acute blood loss anemia 05/12/2019  . Stroke (cerebrum) ([place]) 05/08/2019  . Acute ischemic stroke ([place]) L MCA s/p attempted IR, embolic d/t PAF, low EF and subtherapeutic AC 05/08/2019  . Status post aortic valve replacement 02/23/2019  . Pacemaker [place] Jude device implanted in summer 2020 02/23/2019  . Erectile dysfunction 12/10/2017  . Nontraumatic multiple localized intracerebral hemorrhages ([place]) 10/16/2017  . Dysarthria 09/10/2017  . VBI (vertebrobasilar insufficiency) 09/10/2017  . Cholelithiasis 12/20/2016  . Current use of long term anticoagulation 12/20/2016  . Late effects of CVA  (cerebrovascular accident) 08/08/2015  . Paroxysmal atrial fibrillation ([place]) 08/08/2015  . Presence of prosthetic heart valve 08/08/2015  . 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 Idiopathic peripheral neuropathy 07/14/2013  . Cerebral thrombosis with cerebral infarction ([place]) 07/14/2013  . Generalized constriction of visual field 10/15/2012  . Seroma, postoperative 06/13/2012  . CVA (cerebral infarction) 05/11/2012  . Cerebral infarction ([place]) 05/11/2012  . Stroke, embolic ([place]) 47/82/9562    Class: Acute  . History of CVA (cerebrovascular accident) 05/07/2012  . HCAP (healthcare-associated pneumonia) 05/06/2012  . Syncope 05/06/2012  . Anxiety 05/06/2012  . Aortic stenosis 05/06/2012  . Aortic aneurysm and dissection ([place]) 05/06/2012  . Thoracic aortic aneurysm ([place]) 05/06/2012  . Aortic valve disorder 05/06/2012  . 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 Aneurysm of thoracic aorta ([place]) 05/06/2012    Past Medical History:  Past Medical History:  Diagnosis Date  . Aneurysm of thoracic aorta ([place]) 05/06/2012   IMO routine update IMO routine update  . Anxiety    sees Dr. Wende Neighbors  . Aortic aneurysm and dissection ([place]) 05/06/2012  . Aortic stenosis    mechanical AVR 04/22/12  . Aortic valve disorder 05/06/2012  . 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 Bicuspid aortic valve   . Cerebral infarction ([place]) 05/11/2012  . Cerebral thrombosis with cerebral infarction ([place]) 07/14/2013  . Cholelithiasis 12/20/2016  . Current use of long term anticoagulation 12/20/2016  . CVA (cerebral infarction)    occipital CVA 12/13  . 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 Dysarthria 09/10/2017  . Dysrhythmia    sees Dr. Leslie Dales, [facility] cardiology in [place]  .  Generalized constriction of visual field 10/15/2012   IMOUPDATE IMOUPDATE  . Generalized ischemic  cerebrovascular disease 07/14/2013  . GERD (gastroesophageal reflux disease)   . HCAP (healthcare-associated pneumonia) 05/06/2012  . Headache(784.0)    "due to vision problem"  . History of CVA (cerebrovascular accident) 05/07/2012  . Hypothyroidism   . Idiopathic peripheral neuropathy 07/14/2013  . Late effects of CVA (cerebrovascular accident) 08/08/2015  . Migraine   . Nontraumatic multiple localized intracerebral hemorrhages ([place]) 10/16/2017  . Paroxysmal atrial fibrillation ([place]) 08/08/2015   On warfarin On warfarin  . Presence of prosthetic heart valve 08/08/2015  . Shortness of breath   . Stroke ([place])    x3  . Stroke, embolic ([place]) 06/11/9561  . Syncope 05/06/2012  . Thoracic aortic aneurysm ([place])    replacement aortic root 04/22/12  . VBI (vertebrobasilar insufficiency) 09/10/2017   Past Surgical History:  Past Surgical History:  Procedure Laterality Date  . APPLICATION OF WOUND VAC  06/11/2012   Procedure: APPLICATION OF WOUND VAC;  Surgeon: Melrose Nakayama, MD;  Location: [place];  Service: Vascular;  Laterality: Right;  . CARDIAC CATHETERIZATION    . CHOLECYSTECTOMY    . CORONARY ARTERY BYPASS GRAFT  04/22/2012   Procedure: CORONARY ARTERY BYPASS GRAFTING (CABG);  Surgeon: Melrose Nakayama, MD;  Location: [place];  Service: Open Heart Surgery;  Laterality: N/A;  times one with right greater saphenous vein graft  . I & D EXTREMITY  06/11/2012   Procedure: IRRIGATION AND DEBRIDEMENT EXTREMITY;  Surgeon: Melrose Nakayama, MD;  Location: [place];  Service: Vascular;  Laterality: Right;  . INTRAOPERATIVE TRANSESOPHAGEAL ECHOCARDIOGRAM  04/22/2012   Procedure: INTRAOPERATIVE TRANSESOPHAGEAL ECHOCARDIOGRAM;  Surgeon: Melrose Nakayama, MD;  Location: [place];  Service: Open Heart Surgery;  Laterality: N/A;  . IR CT HEAD LTD  05/08/2019  . IR PERCUTANEOUS ART THROMBECTOMY/INFUSION INTRACRANIAL INC DIAG ANGIO  05/08/2019  . IR US GUIDE VASC ACCESS RIGHT  05/08/2019  . PERMANENT PACEMAKER  INSERTION  10/2018   St. Jude's dual PPM  . RADIOLOGY WITH ANESTHESIA N/A 05/08/2019   Procedure: IR WITH ANESTHESIA;  Surgeon: Radiologist, Medication, MD;  Location: [place];  Service: Radiology;  Laterality: N/A;  . ROTATOR CUFF REPAIR     12 years ago, Left  . THORACIC AORTIC ANEURYSM REPAIR  04/22/2012   Procedure: THORACIC ASCENDING ANEURYSM REPAIR (AAA);  Surgeon: Melrose Nakayama, MD;  Location: [place];  Service: Open Heart Surgery;  Laterality: N/A;    Assessment & Plan Clinical Impression: Patient is a 54 y.o. year old male history of CAF, CHB- s/p PPM, migraines, aortic dissection s/p AVR complicated by CVA with residual dysarthria. He was admitted on 05/08/19 with sudden onsetof leftward gaze and right flaccid paralysis. CT head showed abnormal hypodensity left colossal body and possible pericallosal gyrus.CTA head/neck showed thrombosis of right innominate artery extending into right CCA with high-grade stenosis and abrupt occlusion of proximal A2 left ACA with reconstitution distally and extensive airspace disease in left apex suspicious for PNA. CTA chest showed diffuse lung opacity concerning for PNA and dissection or aneurysm .He underwent cerebral angio with attempted thrombectomy of left A2 occlusion without significant improvement in flow and nonflow limiting dissection of distal cervical segment identified. Coumadin is subtherapeutic at admission --multiple attempts made to contact family for input and the reported patient had been recently discharged on hospital on Lovenox twice daily however mother had not been administering injections. 2D echo showed EF of 25 to 30% with no  S3. He was placed on IV heparin and neurology felt the stroke was embolic in setting of subtherapeutic INR. He was placed on dysphagia 1 diet with honey liquids with chin tuckdue to dysphagia with aspiration of nectarson 01/03.  MRI brain done revealing multifocal restricted diffusion involving  left frontoparietal lobes in both ACA and MCA territory as well as involvement of corpus callosum minor involvement of left occipital lobe question watershed and contralateral involvement of right anterolateral thalamus and right centrum ovale.He has had issues with hypotension requiring levophed off and on.He had progressive leukocytosis with increasing cough and chest x-ray1/05done revealing aspiration pneumonia. He was made n.p.o. and cortak placed for nutritional support. Therapy ongoing and patient showing ability to follow simple commands with expressive aphasia, dysarthria and poor safety awareness.   Patient transferred to CIR on 05/13/2019 .    Patient currently requires max with basic self-care skills and mod A for basic transfers  secondary to muscle weakness, decreased cardiorespiratoy endurance and decreased oxygen support, impaired timing and sequencing and unbalanced muscle activation, decreased visual acuity, decreased attention, decreased awareness, decreased safety awareness and decreased memory and decreased sitting balance, decreased standing balance, hemiplegia and decreased balance strategies.  Prior to hospitalization, patient could complete ADL with mod I up to 2 weeks ago.  Patient will benefit from skilled intervention to decrease level of assist with basic self-care skills and increase independence with basic self-care skills prior to discharge home with care partner.  Anticipate patient will require 24 hour supervision and follow up home health.  OT - End of Session Activity Tolerance: Tolerates 30+ min activity with multiple rests Endurance Deficit: Yes Endurance Deficit Description: SOB with activity, required multiple rest breaks OT Assessment Rehab Potential (ACUTE ONLY): Good OT Patient demonstrates impairments in the following area(s): Balance;Safety;Sensory;Cognition;Vision;Endurance;Motor;Pain OT Basic ADL's Functional Problem(s):  Grooming;Bathing;Dressing;Toileting OT Transfers Functional Problem(s): Toilet;Tub/Shower OT Additional Impairment(s): Fuctional Use of Upper Extremity OT Plan OT Intensity: Minimum of 1-2 x/day, 45 to 90 minutes OT Frequency: 5 out of 7 days OT Duration/Estimated Length of Stay: ~15-18 days OT Treatment/Interventions: Balance/vestibular training;Discharge planning;Functional electrical stimulation;Pain management;Self Care/advanced ADL retraining;Therapeutic Activities;UE/LE Coordination activities;Cognitive remediation/compensation;Disease mangement/prevention;Functional mobility training;Patient/family education;Therapeutic Exercise;Visual/perceptual remediation/compensation;Community reintegration;Neuromuscular re-education;DME/adaptive equipment instruction;Psychosocial support;UE/LE Strength taining/ROM OT Self Feeding Anticipated Outcome(s): n/a at this time OT Basic Self-Care Anticipated Outcome(s): supervision OT Toileting Anticipated Outcome(s): supervision OT Bathroom Transfers Anticipated Outcome(s): supervision OT Recommendation Recommendations for Other Services: Neuropsych consult Patient destination: Home Follow Up Recommendations: None Equipment Recommended: To be determined   Skilled Therapeutic Intervention OT eval initiated with OT purpose, role and goals discussed with pt and pt's wife. Pt received in bed. Self care retraining at sink level. Pt currently tolerating 4 liters of O2 with activity with periodic rest breaks. PT was able to maintain O2 sats above 90% on 4 liters. Pt came to EOB with mod A and transferred into the w/c then toilet with mod A with cues for sequencing. Pt unable to void on toilet.  PT able to follow one step directions. Pt participated in bathing and dressing at sink level. RN present in the session to administer tube feeds. See below for how he performed. Pt accidentally rubbed out his running IV in session with bleeding present. After applying  pressure able to continue. Pt required environmental and verbal cues for sequencing. Focus on utilization of left hand with min A against gravity and to secure grasp. Pt did become incontinent of urine when standing. Pt able to come into standing with mod  A progressing to min A. Oral care performed with suction with total A. Pt wet sounding and audible   breathing- RN reported breathing treatment from RT would be soon. Encouraged pt to continue to sit up in w/c after session with safety belt.    OT Evaluation Precautions/Restrictions  Precautions Precautions: Fall Precaution Comments: R hemi, dysarthria, dysphagia, on 4L O2 via [place], NG tube, and NPO Restrictions Weight Bearing Restrictions: No General Chart Reviewed: Yes Family/Caregiver Present: Yes(mother) Vital Signs Therapy Vitals Pulse Rate: 68 Resp: 18 Patient Position (if appropriate): Sitting Oxygen Therapy SpO2: 96 % O2 Device: Nasal Cannula O2 Flow Rate (L/min): 4 L/min Pain  no c/o pain in session  Home Living/[place] expects to be discharged to:: Private residence Living Arrangements: Parent Available Help at Discharge: Family, Available 24 hours/day Type of Home: House Home Access: Stairs to enter [employer] of Steps: 2 Entrance Stairs-Rails: None Home Layout: One level Bathroom Shower/Tub: Chiropodist: Standard Bathroom Accessibility: Yes Additional Comments: Pt able to answer yes/no questions, otherwise extremtly difficult to understand. History taken from chart review  Lives With: Family Prior Function Level of Independence: Requires assistive device for independence Driving: No Vocation Requirements: not working Comments: Pt reports he used RW at home previously. Limited info due to dysarthria. ADL ADL Eating: NPO Grooming: Maximal assistance Upper Body Bathing: Moderate assistance Where Assessed-Upper Body Bathing: Wheelchair, Sitting  at sink Lower Body Bathing: Maximal assistance Where Assessed-Lower Body Bathing: Wheelchair, Sitting at sink Upper Body Dressing: Moderate assistance Where Assessed-Upper Body Dressing: Wheelchair Lower Body Dressing: Maximal assistance Where Assessed-Lower Body Dressing: Wheelchair, Sitting at sink Toileting: Maximal assistance Where Assessed-Toileting: Glass blower/designer: Moderate assistance Toilet Transfer Method: Stand pivot Toilet Transfer Equipment: Energy manager: Not assessed Vision Baseline Vision/History: (mother reports deficits from previous aliments) Patient Visual Report: Other (comment) Eye Alignment: Impaired (comment) Additional Comments: pt at times closes left eye; pt reports "no" to blurry vision; also additional head movements to locate objects in ADLs- will continue to assess Perception  Perception: Within Functional Limits Praxis Praxis: Impaired Praxis Impairment Details: Motor planning Cognition Overall Cognitive Status: History of cognitive impairments - at baseline Arousal/Alertness: Awake/alert Orientation Level: Person;Place Year: 2021 Month: January Day of Week: Incorrect(unable to understand pt) Memory: Appears intact(difficult to fully assess due to severe dysarthria, but appeared in tact. Will continue to assess in dx tx.) Immediate Memory Recall: (difficulty to full assess due to dysarthria  and visual impairements- will continue to assess; SLP on board) Attention: Sustained Sustained Attention: Appears intact Awareness: Impaired Awareness Impairment: Emergent impairment Problem Solving: Impaired Problem Solving Impairment: Functional basic;Verbal basic Executive Function: Sequencing Sequencing: Impaired Sequencing Impairment: Functional basic Safety/Judgment: Impaired Comments: slightly impulsive Sensation Sensation Light Touch: Impaired Detail Light Touch Impaired Details: Impaired RUE Proprioception: Impaired  Detail Proprioception Impaired Details: Impaired RUE;Impaired RLE Coordination Gross Motor Movements are Fluid and Coordinated: No Fine Motor Movements are Fluid and Coordinated: No Coordination and Movement Description: Grossly uncoordinated due to LE weakness and R hemiparesis Motor  Motor Motor: Hemiplegia Mobility  Bed Mobility Supine to Sit: Moderate Assistance - Patient 50-74% Transfers Sit to Stand: Moderate Assistance - Patient 50-74% Stand to Sit: Moderate Assistance - Patient 50-74%  Trunk/Postural Assessment  Cervical Assessment Cervical Assessment: Within Functional Limits Thoracic Assessment Thoracic Assessment: Within Functional Limits Lumbar Assessment Lumbar Assessment: Exceptions to WFL(posterior pelvic tilt) Postural Control Postural Control: Deficits on evaluation Protective Responses: delayed  Balance Static Sitting Balance Static Sitting -  Level of Assistance: 5: Stand by assistance Dynamic Sitting Balance Dynamic Sitting - Balance Support: During functional activity Dynamic Sitting - Level of Assistance: 4: Min assist Static Standing Balance Static Standing - Balance Support: During functional activity Static Standing - Level of Assistance: 4: Min assist;3: Mod assist Extremity/Trunk Assessment RUE Assessment RUE Assessment: Exceptions to [facility] RUE Body System: Neuro Brunstrum levels for arm and hand: Arm;Hand Brunstrum level for arm: Stage IV Movement is deviating from synergy Brunstrum level for hand: Stage IV Movements deviating from synergies RUE Tone RUE Tone: Within Functional Limits LUE Assessment LUE Assessment: Within Functional Limits     Refer to Care Plan for Long Term Goals  Recommendations for other services: Neuropsych   Discharge Criteria: Patient will be discharged from OT if patient refuses treatment 3 consecutive times without medical reason, if treatment goals not met, if there is a change in medical status, if patient makes  no progress towards goals or if patient is discharged from hospital.  The above assessment, treatment plan, treatment alternatives and goals were discussed and mutually agreed upon: by patient  Nicoletta Ba 05/14/2019, 6:47 PM

## 2019-05-14 NOTE — Progress Notes (Signed)
Inpatient Rehabilitation  Patient information reviewed and entered into eRehab system by Rifka Ramey M. Uilani Sanville, M.A., CCC/SLP, PPS Coordinator.  Information including medical coding, functional ability and quality indicators will be reviewed and updated through discharge.    

## 2019-05-14 NOTE — Progress Notes (Signed)
ANTICOAGULATION CONSULT NOTE  Pharmacy Consult for Heparin Indication: stroke and mechanical valve, atrial fibrillation  Allergies  Allergen Reactions  . Hydrocodone-Acetaminophen Other (See Comments)    PT MOTHER DOES NOT REMEMBER REACTION  Only use in Emergency   . Hydromorphone Itching and Rash       . Other Other (See Comments)    ALL NARCOTICS-ITCHING, RASH   . Oxycodone Other (See Comments)    Unknown reaction  . Penicillins Rash    Did it involve swelling of the face/tongue/throat, SOB, or low BP? Unknown Did it involve sudden or severe rash/hives, skin peeling, or any reaction on the inside of your mouth or nose? Unknown Did you need to seek medical attention at a hospital or doctor's office? Unknown When did it last happen?unknown If all above answers are "NO", may proceed with cephalosporin use.  . Dilaudid [Hydromorphone Hcl] Itching and Rash    Patient Measurements: Total Body Weight: 106 kg Height: 71 inches Heparin Dosing Weight: 97.7kg  Vital Signs: Temp: 98.4 F (36.9 C) (01/06 1956) Temp Source: Oral (01/06 1956) BP: 117/73 (01/06 1956) Pulse Rate: 66 (01/06 1956)  Labs: Recent Labs    05/12/19 0716 05/13/19 0330 05/13/19 1634 05/13/19 1755 05/14/19 0118  HGB 11.0* 11.4*  --   --  7.7*  HCT 34.0* 36.0*  --   --  24.0*  PLT 292 263  --   --  216  LABPROT 15.6* 14.1  --   --  18.9*  INR 1.3* 1.1  --   --  1.6*  HEPARINUNFRC 0.35 0.21* 0.24* 0.26* 0.27*  CREATININE 0.74 0.79  --   --   --     Estimated Creatinine Clearance: 132.3 mL/min (by C-G formula based on SCr of 0.79 mg/dL).   Assessment: 54 yr old male presented to the ED with stroke symptoms after recent discharge from [facility]. He is supposed to be on warfarin and Lovenox for history of a mechanical valve.  Patient is S/P thrombectomy and continues on IV heparin.    Heparin level on heparin infusion at 1600 units/hr remains subtherapeutic at 0.27.  Will increase  conservatively given lower goal range.   Goal of Therapy:  Heparin level 0.3-0.5 units/ml  Monitor platelets by anticoagulation protocol: Yes   Plan:  Increase heparin infusion to 1750 units/hr  Check 6-hr heparin level Monitor daily heparin level, CBC, INR Monitor for signs/symptoms of bleeding  [employer], Pharm.D., BCPS Clinical Pharmacist  **Pharmacist phone directory can be found on [email or web address] listed under [facility].  05/14/2019 2:19 AM

## 2019-05-14 NOTE — Evaluation (Signed)
Physical Therapy Assessment and Plan  Patient Details  Name: Jon [name] MRN: [id number] Date of Birth: 04/13/1966  PT Diagnosis: Abnormal posture, Abnormality of gait, Coordination disorder, Difficulty walking, Hemiparesis dominant, Impaired sensation and Muscle weakness Rehab Potential: Good ELOS: 25-28 days   Today's Date: 05/14/2019 PT Individual Time: 0800-0906 PT Individual Time Calculation (min): 66 min    Problem List:  Patient Active Problem List   Diagnosis Date Noted  . Embolic stroke ([place]) 19/41/7408  . Dysphagia, post-stroke   . Migraine without status migrainosus, not intractable   . Anxiety and depression   . Chronic obstructive pulmonary disease ([place])   . OSA (obstructive sleep apnea)   . Chronic atrial fibrillation ([place])   . Hypokalemia   . Essential hypertension 05/12/2019  . Hyperlipidemia 05/12/2019  . Diabetes mellitus type II, controlled ([place]) 05/12/2019  . Dysphagia 05/12/2019  . Aspiration pneumonia ([place]) 05/12/2019  . Leukocytosis 05/12/2019  . Cigarette smoker 05/12/2019  . Obesity 05/12/2019  . Family hx-stroke 05/12/2019  . Acute blood loss anemia 05/12/2019  . Stroke (cerebrum) ([place]) 05/08/2019  . Acute ischemic stroke ([place]) L MCA s/p attempted IR, embolic d/t PAF, low EF and subtherapeutic AC 05/08/2019  . Status post aortic valve replacement 02/23/2019  . Pacemaker [place] Jude device implanted in summer 2020 02/23/2019  . Erectile dysfunction 12/10/2017  . Nontraumatic multiple localized intracerebral hemorrhages ([place]) 10/16/2017  . Dysarthria 09/10/2017  . VBI (vertebrobasilar insufficiency) 09/10/2017  . Cholelithiasis 12/20/2016  . Current use of long term anticoagulation 12/20/2016  . Late effects of CVA (cerebrovascular accident) 08/08/2015  . Paroxysmal atrial fibrillation ([place]) 08/08/2015  . Presence of prosthetic heart valve 08/08/2015  . 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 Generalized  ischemic cerebrovascular disease 07/14/2013  . Idiopathic peripheral neuropathy 07/14/2013  . Cerebral thrombosis with cerebral infarction ([place]) 07/14/2013  . Generalized constriction of visual field 10/15/2012  . Seroma, postoperative 06/13/2012  . CVA (cerebral infarction) 05/11/2012  . Cerebral infarction ([place]) 05/11/2012  . Stroke, embolic ([place]) 14/48/1856    Class: Acute  . History of CVA (cerebrovascular accident) 05/07/2012  . HCAP (healthcare-associated pneumonia) 05/06/2012  . Syncope 05/06/2012  . Anxiety 05/06/2012  . Aortic stenosis 05/06/2012  . Aortic aneurysm and dissection ([place]) 05/06/2012  . Thoracic aortic aneurysm ([place]) 05/06/2012  . Aortic valve disorder 05/06/2012  . 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 Aneurysm of thoracic aorta ([place]) 05/06/2012    Past Medical History:  Past Medical History:  Diagnosis Date  . Aneurysm of thoracic aorta ([place]) 05/06/2012   IMO routine update IMO routine update  . Anxiety    sees Dr. Wende Neighbors  . Aortic aneurysm and dissection ([place]) 05/06/2012  . Aortic stenosis    mechanical AVR 04/22/12  . Aortic valve disorder 05/06/2012  . 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 Bicuspid aortic valve   . Cerebral infarction ([place]) 05/11/2012  . Cerebral thrombosis with cerebral infarction ([place]) 07/14/2013  . Cholelithiasis 12/20/2016  . Current use of long term anticoagulation 12/20/2016  . CVA (cerebral infarction)    occipital CVA 12/13  . 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 Dysarthria 09/10/2017  . Dysrhythmia    sees Dr. Leslie Dales, [facility] cardiology in [place]  . Generalized constriction of visual field 10/15/2012   IMOUPDATE IMOUPDATE  . 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 GERD (gastroesophageal reflux disease)   . HCAP (healthcare-associated pneumonia) 05/06/2012  . Headache(784.0)    "  due to vision problem"  . History of CVA (cerebrovascular accident)  05/07/2012  . Hypothyroidism   . Idiopathic peripheral neuropathy 07/14/2013  . Late effects of CVA (cerebrovascular accident) 08/08/2015  . Migraine   . Nontraumatic multiple localized intracerebral hemorrhages ([place]) 10/16/2017  . Paroxysmal atrial fibrillation ([place]) 08/08/2015   On warfarin On warfarin  . Presence of prosthetic heart valve 08/08/2015  . Shortness of breath   . Stroke ([place])    x3  . Stroke, embolic ([place]) 08/06/7060  . Syncope 05/06/2012  . Thoracic aortic aneurysm ([place])    replacement aortic root 04/22/12  . VBI (vertebrobasilar insufficiency) 09/10/2017   Past Surgical History:  Past Surgical History:  Procedure Laterality Date  . APPLICATION OF WOUND VAC  06/11/2012   Procedure: APPLICATION OF WOUND VAC;  Surgeon: Melrose Nakayama, MD;  Location: [facility];  Service: Vascular;  Laterality: Right;  . CARDIAC CATHETERIZATION    . CHOLECYSTECTOMY    . CORONARY ARTERY BYPASS GRAFT  04/22/2012   Procedure: CORONARY ARTERY BYPASS GRAFTING (CABG);  Surgeon: Melrose Nakayama, MD;  Location: [facility];  Service: Open Heart Surgery;  Laterality: N/A;  times one with right greater saphenous vein graft  . I & D EXTREMITY  06/11/2012   Procedure: IRRIGATION AND DEBRIDEMENT EXTREMITY;  Surgeon: Melrose Nakayama, MD;  Location: [facility];  Service: Vascular;  Laterality: Right;  . INTRAOPERATIVE TRANSESOPHAGEAL ECHOCARDIOGRAM  04/22/2012   Procedure: INTRAOPERATIVE TRANSESOPHAGEAL ECHOCARDIOGRAM;  Surgeon: Melrose Nakayama, MD;  Location: [facility];  Service: Open Heart Surgery;  Laterality: N/A;  . IR CT HEAD LTD  05/08/2019  . IR PERCUTANEOUS ART THROMBECTOMY/INFUSION INTRACRANIAL INC DIAG ANGIO  05/08/2019  . IR US GUIDE VASC ACCESS RIGHT  05/08/2019  . PERMANENT PACEMAKER INSERTION  10/2018   St. Jude's dual PPM  . RADIOLOGY WITH ANESTHESIA N/A 05/08/2019   Procedure: IR WITH ANESTHESIA;  Surgeon: Radiologist, Medication, MD;  Location: [facility];  Service: Radiology;  Laterality: N/A;  . ROTATOR  CUFF REPAIR     12 years ago, Left  . THORACIC AORTIC ANEURYSM REPAIR  04/22/2012   Procedure: THORACIC ASCENDING ANEURYSM REPAIR (AAA);  Surgeon: Melrose Nakayama, MD;  Location: [facility];  Service: Open Heart Surgery;  Laterality: N/A;    Assessment & Plan Clinical Impression: Patient is a 55 y.o. year old male with history of CAF, CHB- s/p PPM, migraines, aortic dissection s/p AVR complicated by CVA with residual dysarthria. He was admitted on 05/08/19 with sudden onsetof leftward gaze and right flaccid paralysis. CT head showed abnormal hypodensity left colossal body and possible pericallosal gyrus.CTA head/neck showed thrombosis of right innominate artery extending into right CCA with high-grade stenosis and abrupt occlusion of proximal A2 left ACA with reconstitution distally and extensive airspace disease in left apex suspicious for PNA. CTA chest showed diffuse lung opacity concerning for PNA and dissection or aneurysm .He underwent cerebral angio with attempted thrombectomy of left A2 occlusion without significant improvement in flow and nonflow limiting dissection of distal cervical segment identified. Coumadin is subtherapeutic at admission --multiple attempts made to contact family for input and the reported patient had been recently discharged on hospital on Lovenox twice daily however mother had not been administering injections. 2D echo showed EF of 25 to 30% with no S3. He was placed on IV heparin and neurology felt the stroke was embolic in setting of subtherapeutic INR. He was placed on dysphagia 1 diet with honey liquids with chin tuckdue to dysphagia with  aspiration of ne[place] 01/03.  MRI brain done revealing multifocal restricted diffusion involving left frontoparietal lobes in both ACA and MCA territory as well as involvement of corpus callosum minor involvement of left occipital lobe question watershed and contralateral involvement of right anterolateral thalamus and  right centrum ovale.He has had issues with hypotension requiring levophed off and on.He had progressive leukocytosis with increasing cough and chest x-ray1/05done revealing aspiration pneumonia. He was made n.p.o. and cortak placed for nutritional support. Therapy ongoing and patient showing ability to follow simple commands with expressive aphasia, dysarthria and poor safety awareness. CIR recommended due to functional decline.Pt is to admit to CIR on 05/13/19.   Patient currently requires mod with mobility secondary to muscle weakness, impaired timing and sequencing, decreased coordination and decreased motor planning and decreased sitting balance, decreased standing balance, decreased postural control, hemiplegia and decreased balance strategies. Prior to hospitalization, patient was modified independent  with mobility and lived with Family in a House home. Home access is 2Stairs to enter.  Patient will benefit from skilled PT intervention to maximize safe functional mobility, minimize fall risk and decrease caregiver burden for planned discharge home with 24 hour supervision.  Anticipate patient will benefit from follow up [place] at discharge.  PT - End of Session Activity Tolerance: Tolerates 30+ min activity with multiple rests Endurance Deficit: Yes Endurance Deficit Description: SOB with activity, required multiple rest breaks PT Assessment Rehab Potential (ACUTE/IP ONLY): Good PT Barriers to Discharge: Medical stability PT Barriers to Discharge Comments: Limited by dysarthria PT Patient demonstrates impairments in the following area(s): Balance;Endurance;Motor;Safety;Sensory;Perception PT Transfers Functional Problem(s): Bed Mobility;Bed to Chair;Car;Furniture PT Locomotion Functional Problem(s): Ambulation;Wheelchair Mobility;Stairs PT Plan PT Intensity: Minimum of 1-2 x/day ,45 to 90 minutes PT Frequency: 5 out of 7 days PT Duration Estimated Length of Stay: 25-28 days PT  Treatment/Interventions: Ambulation/gait training;Discharge planning;Functional mobility training;Therapeutic Activities;Balance/vestibular training;Disease management/prevention;Neuromuscular re-education;Therapeutic Exercise;Wheelchair propulsion/positioning;Cognitive remediation/compensation;DME/adaptive equipment instruction;Pain management;Splinting/orthotics;UE/LE Strength taining/ROM;Community reintegration;Functional electrical stimulation;Patient/family education;Stair training;UE/LE Coordination activities PT Transfers Anticipated Outcome(s): Supervision with LRAD PT Locomotion Anticipated Outcome(s): CGA with LRAD PT Recommendation Follow Up Recommendations: Home health PT Patient destination: Home Equipment Recommended: To be determined Equipment Details: Has RW, single point cane, and manual WC  Skilled Therapeutic Intervention Evaluation completed (see details above and below) with education on PT POC and goals and individual treatment initiated with focus on functional mobility/transfers, ambulation, car transfer, stair navigation, LE strength, NMR, balance/coordination/motor planning, improved activity tolerance.   Received pt supine in bed, pt educated on PT evaluation and therapy schedule and agreeable. Pt with severe dysarthria and difficulty with verbal expression but able to answer yes/no questions appropriately. Pt denied any pain during session. Pt on 4L O2 via [place] throughout session. Per RN, pt restricted to therapy in room due to multiple IVs and Heparin. In supine O2 sat 88% on 4L O2 via [place] increasing to 93% with education for pursed lip breathing techniques. Therapist donned non-skid socks with max A. Pt rolled to L with CGA and to R with min A with HOB slightly elevated and use of bedrails. Pt transferred supine<>sit EOB with mod A for R UE and trunk control. O2 sat 93%. Pt required CGA/min A for static sitting with minor R sided lean. Pt able to correct lean with verbal cues  however, demonstrated poor carry over. Lab tech present during session for blood draws. Pt transferred sit<>stand with R UE supported around therapist without AD mod A with bed elevated. Pt transferred bed<>chair stand<>pivot without AD max A  x 2 trials with mild impulsivity and poor eccentric control upon descent. Pt transferred sit<>stand with RW mod A and with increased time to place RUE on walker. Pt stood with narrow BOS and required verbal cues for wider BOS and to remain upright. Stand<>sit mod A for eccentric control. Pt with facial expressions of fatigue and pt spontaneously transferred sit<>supine. O2 sat 93%. When therapist asked if pt was fatigued pt responded "no", however body language demonstrated otherwise. Pt able to communicate through body language that brief was wet. PT doffed soiled incontinence brief and donned clean one with mod A. Pt able to bridge hips up to assist with donning brief. Concluded session with pt supine in bed, needs within reach, and bed alarm on.   PT Evaluation Precautions/Restrictions Precautions Precautions: Fall Precaution Comments: R hemi, dysarthria, dysphagia, on 4L O2 via [place], NG tube, and NPO Restrictions Weight Bearing Restrictions: No Home Living/Prior Functioning Home Living Available Help at Discharge: Family;Available 24 hours/day Type of Home: House Home Access: Stairs to enter [employer] of Steps: 2 Entrance Stairs-Rails: None Home Layout: One level Bathroom Shower/Tub: Research officer, trade union Accessibility: Yes Additional Comments: Pt able to answer yes/no questions, otherwise extremtly difficult to understand. History taken from chart review  Lives With: Family Prior Function Level of Independence: Requires assistive device for independence Driving: No Vocation Requirements: not working Comments: Pt reports he used RW at home previously. Limited info due to dysarthria. Cognition Overall Cognitive Status: Difficult to  assess Orientation Level: Other (comment)(dysarthria; difficult to understand) Awareness: Appears intact Problem Solving: Impaired Safety/Judgment: Impaired Comments: mild impulsitivity Sensation Sensation Light Touch: Impaired by gross assessment Proprioception: Impaired by gross assessment Additional Comments: difficult to assess due to dysarthria Coordination Gross Motor Movements are Fluid and Coordinated: No Fine Motor Movements are Fluid and Coordinated: No Coordination and Movement Description: Grossly uncoordinated due to LE weakness and R hemiparesis Finger Nose Finger Test: dysmetria on RUE, WFL L UE Heel Shin Test: decreased bilaterally Motor  Motor Motor: Hemiplegia;Abnormal postural alignment and control;Other (comment)(R hemiparesis) Motor - Skilled Clinical Observations: grossly uncoordinated due to LE weakness and R hemiparesis  Mobility Bed Mobility Bed Mobility: Rolling Right;Rolling Left;Supine to Sit;Sit to Supine Rolling Right: Minimal Assistance - Patient > 75%(use of bedrails) Rolling Left: Contact Guard/Touching assist(use of bedrails) Supine to Sit: Moderate Assistance - Patient 50-74%(bedrails) Sit to Supine: Moderate Assistance - Patient 50-74%(bedrails) Transfers Transfers: Sit to Stand;Stand to Sit;Stand Pivot Transfers Sit to Stand: Moderate Assistance - Patient 50-74%(R arm supported around therapist's shoulder) Stand to Sit: Moderate Assistance - Patient 50-74% Stand Pivot Transfers: Maximal Assistance - Patient 25 - 49% Stand Pivot Transfer Details: Verbal cues for sequencing;Verbal cues for technique;Verbal cues for precautions/safety Stand Pivot Transfer Details (indicate cue type and reason): Verbal cues for step sequence, foot placement when turning, and R UE placement Trunk/Postural Assessment  Cervical Assessment Cervical Assessment: Within Functional Limits Thoracic Assessment Thoracic Assessment: Within Functional Limits Lumbar  Assessment Lumbar Assessment: Exceptions to WFL(posterior pelvic tilt) Postural Control Postural Control: Deficits on evaluation  Balance Balance Balance Assessed: Yes Static Sitting Balance Static Sitting - Balance Support: Bilateral upper extremity supported Static Sitting - Level of Assistance: 4: Min assist Dynamic Sitting Balance Dynamic Sitting - Balance Support: Bilateral upper extremity supported Dynamic Sitting - Level of Assistance: 4: Min Insurance risk surveyor Standing - Balance Support: No upper extremity supported Static Standing - Level of Assistance: 3: Mod assist Extremity Assessment  RLE Assessment RLE Assessment: Exceptions to [facility] RLE  Strength Right Hip Flexion: 3/5 Right Hip ABduction: 3/5 Right Hip ADduction: 3+/5 Right Knee Extension: 4-/5 LLE Assessment LLE Assessment: Exceptions to [facility] LLE Strength Left Hip Flexion: 4-/5 Left Hip ABduction: 4-/5 Left Hip ADduction: 4-/5 Left Knee Extension: 4/5   Refer to Care Plan for Long Term Goals  Recommendations for other services: None   Discharge Criteria: Patient will be discharged from PT if patient refuses treatment 3 consecutive times without medical reason, if treatment goals not met, if there is a change in medical status, if patient makes no progress towards goals or if patient is discharged from hospital.  The above assessment, treatment plan, treatment alternatives and goals were discussed and mutually agreed upon: by patient  Alfonse Alpers PT, DPT  05/14/2019, 7:30 AM

## 2019-05-14 NOTE — Progress Notes (Signed)
Initial Nutrition Assessment  DOCUMENTATION CODES:   Obesity unspecified  INTERVENTION:   Initiate bolus tube feeding regimen via Cortrak: - 1 carton (237 ml) Osmolite 1.5 cal formula 6 X daily - Pro-stat 30 ml BID per tube - Free water flushes per MD/PA, currently 100 ml QID  Tube feeding regimen provides 2333 kcal, 119 grams of protein, and 1484 ml of H2O.  NUTRITION DIAGNOSIS:   Inadequate oral intake related to dysphagia as evidenced by NPO status.  GOAL:   Patient will meet greater than or equal to 90% of their needs  MONITOR:   Diet advancement, Labs, Weight trends, TF tolerance  REASON FOR ASSESSMENT:   Consult Enteral/tube feeding initiation and management  ASSESSMENT:   54 year old male with PMH of CAF, CHB-s/p PPM, migraines, aortic dissection s/p AVR complicated by CVA with residual dysarthria, gait disorder and visual deficits. Pt presented on 05/08/19 with sudden right-sided weakness and left gaze deviation. CT head showed abnormal hypodensity left colossal body and possible pericallosal gyrus. 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 CTA chest showed diffuse lung opacity concerning for PNA and dissection or aneurysm . He underwent cerebral angiogram with attempted thrombectomy of left A2 occlusion without significant improvement in flow and nonflow limiting dissection of distal cervical segment identified. Neurology felt the stroke was embolic in setting of subtherapeutic INR. Pt was placed on dysphagia 1 diet with honey-thick liquids due to dysphagia on 05/10/19. Chest x-ray performed on 05/12/19 suggestive of aspiration pneumonia. Pt was made NPO and Cortrak placed for nutrition support. Pt admitted to CIR on 05/13/19.   Cortrak remains in place, currently clamped as no  Kangaroo feeding pumps are available per Therapist, sports.  Discussed pt with PA and RN. Plan to start bolus tube feeding regimen given pump shortage.  Met with pt at bedside. Pt unable to provide diet or weight history at this time. Pt denies feeling hungry and denies having any abdominal pain at this time. Explained tube feeding plan to pt.  Reviewed weight history in chart. Per weight readings, pt with a 7.9 kg weight loss since 03/04/19. This is a 6.9% weight loss in less than 3 months which is significant for timeframe. Will monitor weights during admission.  Current TF orders: Osmolite 1.5 @ 60 ml/hr, Pro-stat 30 ml BID, free water flushes 100 ml QID  Medications reviewed and include: SSI q 4 hours, protonix, Klor-con con 20 mEq daily, warfarin, IV abx, heparin  Labs reviewed. CBG's: 81-124 x 24 hours  NUTRITION - FOCUSED PHYSICAL EXAM:    Most Recent Value  Orbital Region  No depletion  Upper Arm Region  No depletion  Thoracic and Lumbar Region  No depletion  Buccal Region  No depletion  Temple Region  No depletion  Clavicle Bone Region  No depletion  Clavicle and Acromion Bone Region  Mild depletion  Scapular Bone Region  No depletion  Dorsal Hand  No depletion  Patellar Region  Mild depletion  Anterior Thigh Region  Mild depletion  Posterior Calf Region  Mild depletion  Edema (RD Assessment)  None  Hair  Reviewed  Eyes  Reviewed  Mouth  Reviewed  Skin  Reviewed  Nails  Reviewed       Diet Order:   Diet Order            Diet NPO  time specified  Diet effective now              EDUCATION NEEDS:   No education needs have been identified at this time  Skin:  Skin Assessment: Skin Integrity Issues: Skin Integrity Issues: Incisions: right groin  Last BM:  05/13/19  Height:   Ht Readings from Last 1 Encounters:  05/13/19 5' 11"  (1.803 m)    Weight:   Wt Readings from Last 1 Encounters:  05/13/19 106 kg    Ideal Body Weight:  78.2 kg  BMI:  Body mass index is  32.59 kg/m.  Estimated Nutritional Needs:   Kcal:  6283-1517  Protein:  115-130 grams  Fluid:  >/= 2.0 L    Jon Face, MS, RD, LDN Inpatient Clinical Dietitian Pager: [phone] Weekend/After Hours: [phone]

## 2019-05-15 ENCOUNTER — Inpatient Hospital Stay (HOSPITAL_COMMUNITY): Payer: Medicaid Other | Admitting: Speech Pathology

## 2019-05-15 ENCOUNTER — Inpatient Hospital Stay (HOSPITAL_COMMUNITY): Payer: Medicaid Other | Admitting: Physical Therapy

## 2019-05-15 ENCOUNTER — Inpatient Hospital Stay (HOSPITAL_COMMUNITY): Payer: Medicaid Other | Admitting: Occupational Therapy

## 2019-05-15 ENCOUNTER — Inpatient Hospital Stay (HOSPITAL_COMMUNITY): Payer: Medicaid Other

## 2019-05-15 DIAGNOSIS — J449 Chronic obstructive pulmonary disease, unspecified: Secondary | ICD-10-CM

## 2019-05-15 DIAGNOSIS — R195 Other fecal abnormalities: Secondary | ICD-10-CM

## 2019-05-15 DIAGNOSIS — I69391 Dysphagia following cerebral infarction: Secondary | ICD-10-CM

## 2019-05-15 LAB — CBC WITH DIFFERENTIAL/PLATELET
Abs Immature Granulocytes: 0.13 10*3/uL — ABNORMAL HIGH (ref 0.00–0.07)
Basophils Absolute: 0 10*3/uL (ref 0.0–0.1)
Basophils Relative: 0 %
Eosinophils Absolute: 0.3 10*3/uL (ref 0.0–0.5)
Eosinophils Relative: 2 %
HCT: 31.8 % — ABNORMAL LOW (ref 39.0–52.0)
Hemoglobin: 10.3 g/dL — ABNORMAL LOW (ref 13.0–17.0)
Immature Granulocytes: 1 %
Lymphocytes Relative: 8 %
Lymphs Abs: 1.1 10*3/uL (ref 0.7–4.0)
MCH: 28.9 pg (ref 26.0–34.0)
MCHC: 32.4 g/dL (ref 30.0–36.0)
MCV: 89.1 fL (ref 80.0–100.0)
Monocytes Absolute: 0.9 10*3/uL (ref 0.1–1.0)
Monocytes Relative: 7 %
Neutro Abs: 11.9 10*3/uL — ABNORMAL HIGH (ref 1.7–7.7)
Neutrophils Relative %: 82 %
Platelets: 266 10*3/uL (ref 150–400)
RBC: 3.57 MIL/uL — ABNORMAL LOW (ref 4.22–5.81)
RDW: 17 % — ABNORMAL HIGH (ref 11.5–15.5)
WBC: 14.3 10*3/uL — ABNORMAL HIGH (ref 4.0–10.5)
nRBC: 0 % (ref 0.0–0.2)

## 2019-05-15 LAB — PROTIME-INR
INR: 1.7 — ABNORMAL HIGH (ref 0.8–1.2)
Prothrombin Time: 19.4 seconds — ABNORMAL HIGH (ref 11.4–15.2)

## 2019-05-15 LAB — HEPARIN LEVEL (UNFRACTIONATED)
Heparin Unfractionated: 0.65 IU/mL (ref 0.30–0.70)
Heparin Unfractionated: 0.49 IU/mL (ref 0.30–0.70)

## 2019-05-15 LAB — GLUCOSE, CAPILLARY
Glucose-Capillary: 88 mg/dL (ref 70–99)
Glucose-Capillary: 120 mg/dL — ABNORMAL HIGH (ref 70–99)
Glucose-Capillary: 97 mg/dL (ref 70–99)
Glucose-Capillary: 108 mg/dL — ABNORMAL HIGH (ref 70–99)
Glucose-Capillary: 139 mg/dL — ABNORMAL HIGH (ref 70–99)

## 2019-05-15 MED ORDER — DIPHENOXYLATE-ATROPINE 2.5-0.025 MG/5ML PO LIQD: 5.0000 mL | Freq: Four times a day (QID) | ORAL | Status: DC

## 2019-05-15 MED ORDER — SODIUM CHLORIDE 3 % IN NEBU
4.0000 mL | INHALATION_SOLUTION | Freq: Every day | RESPIRATORY_TRACT | Status: DC
  Filled 2019-05-15: qty 4

## 2019-05-15 MED ORDER — SODIUM CHLORIDE 3 % IN NEBU: 4.0000 mL | INHALATION_SOLUTION | Freq: Every day | RESPIRATORY_TRACT | Status: DC

## 2019-05-15 MED ORDER — OSMOLITE 1.5 CAL PO LIQD
1000.0000 mL | ORAL | Status: DC
  Administered 2019-05-15 (×2): 1000 mL
  Filled 2019-05-15 (×2): qty 1000

## 2019-05-15 MED ORDER — WARFARIN - PHARMACIST DOSING INPATIENT: Freq: Every day | Status: DC

## 2019-05-15 MED ORDER — DIPHENOXYLATE-ATROPINE 2.5-0.025 MG/5ML PO LIQD: 5.0000 mL | Freq: Four times a day (QID) | ORAL | Status: DC | PRN

## 2019-05-15 MED ORDER — WARFARIN SODIUM 7.5 MG PO TABS
7.5000 mg | ORAL_TABLET | Freq: Once | ORAL | Status: AC
  Administered 2019-05-15: 7.5 mg via ORAL
  Filled 2019-05-15: qty 1

## 2019-05-15 MED ORDER — SODIUM CHLORIDE 3 % IN NEBU
4.0000 mL | INHALATION_SOLUTION | Freq: Every day | RESPIRATORY_TRACT | Status: AC
  Administered 2019-05-16 – 2019-05-17 (×2): 4 mL via RESPIRATORY_TRACT
  Filled 2019-05-15 (×4): qty 4

## 2019-05-15 NOTE — Progress Notes (Addendum)
Patient refusing therapy this PM.  Spoke w/ nursing re: not feeling well today and has refused all except ST today.  Patient changed to continuous feeding from bolus feeding d/tc/o stomache ache.  Patient missed 30 of skilled PT time d/t illness.

## 2019-05-15 NOTE — Progress Notes (Signed)
Speech Language Pathology Daily Session Note  Patient Details  Name: Jon [name] MRN: [id number] Date of Birth: 1965-06-03  Today's Date: 05/15/2019 SLP Individual Time: 7322-0254 SLP Individual Time Calculation (min): 45 min  Short Term Goals: Week 1: SLP Short Term Goal 1 (Week 1): Following improved chest x-rays and medical clearance, pt will particiate in instrumental swallow study to determine least restrictive diet. SLP Short Term Goal 2 (Week 1): Pt will utilize speech intelligibility strategies to increase intelligibility to 60% at the word level with Max A verbal cues. SLP Short Term Goal 3 (Week 1): Pt will initiate use of picture and/or letter communication board to communicate complex messages when he is not understood by staff in 3/5 opportunities with Min A cues. SLP Short Term Goal 4 (Week 1): Pt will demonstrate basic problem solving during functional tasks with Mod A verbal/visual cues.  Skilled Therapeutic Interventions: Pt was seen for skilled ST targeting communication goals. Pt very verbose and frustrated at attempts to communicate sentence long utterances upon therapists' arrival. With Max  A cues for pacing and recasting message for clarification, clinician able to decipher pt expressing he has been asking for water but can't get it; therefore, SLP educated pt regarding NPO status, current PNA dx and need for medical clearance before PO trials and MBSS can be pursued. Pt appeared to comprehend message, however still visibly frustrated.   Initial attempts to engage pt in structured word and phrase level tasks were unsuccessful, due to pt's desire to engage in rapport building conversation with therapist. Therefore, SPL provided Max A verbal cues for clarification, repeating using overarticulation and occasional use of letter board for spelling complex messages. Pt was ~25% intelligible during this conversation.  Pt eventually agreeable to deliberately implement pacing  and overarticulation strategies to increase intelligibility of word and short phrase length utterances. Pt required consistent models and Max A verbal cues to implement pacing to aid in increased intelligibility. Pt specifically responded to cues to close his mouth in between each word to avoid words running together and "ah" vowel insertion in between words which distorts message even further. Pt left laying in bed with alarm set and needs within reach. Continue per current plan of care.         Pain Pain Assessment Pain Scale: 0-10 Pain Score: 0-No pain  Therapy/Group: Individual Therapy  [name] 05/15/2019, 1:53 PM

## 2019-05-15 NOTE — Progress Notes (Signed)
ANTICOAGULATION CONSULT NOTE - Follow Up Consult  Pharmacy Consult for Heparin Indication: Afib + mech AVR, New CVA  Allergies  Allergen Reactions  . Hydrocodone-Acetaminophen Other (See Comments)    PT MOTHER DOES NOT REMEMBER REACTION  Only use in Emergency   . Hydromorphone Itching and Rash       . Other Other (See Comments)    ALL NARCOTICS-ITCHING, RASH   . Oxycodone Other (See Comments)    Unknown reaction  . Penicillins Rash    Did it involve swelling of the face/tongue/throat, SOB, or low BP? Unknown Did it involve sudden or severe rash/hives, skin peeling, or any reaction on the inside of your mouth or nose? Unknown Did you need to seek medical attention at a hospital or doctor's office? Unknown When did it last happen?unknown If all above answers are "NO", may proceed with cephalosporin use.  . Dilaudid [Hydromorphone Hcl] Itching and Rash    Patient Measurements: Height: 5\' 11"  (180.3 cm) Weight: 224 lb 10.4 oz (101.9 kg) IBW/kg (Calculated) : 75.3 Heparin Dosing Weight: 97.7   Vital Signs: Temp: 97.7 F (36.5 C) (01/08 1522) BP: 103/61 (01/08 1522) Pulse Rate: 64 (01/08 1522)  Labs: Recent Labs    05/13/19 0330 05/14/19 0118 05/14/19 0349 05/14/19 0842 05/14/19 1738 05/15/19 0543 05/15/19 1811  HGB 11.4* 7.7*  --  10.4*  --  10.3*  --   HCT 36.0* 24.0*  --  31.6*  --  31.8*  --   PLT 263 216  --  249  --  266  --   LABPROT 14.1 18.9*  --   --   --  19.4*  --   INR 1.1 1.6*  --   --   --  1.7*  --   HEPARINUNFRC 0.21* 0.27*  --  0.52 0.48 0.65 0.49  CREATININE 0.79  --  0.79  --   --   --   --     Estimated Creatinine Clearance: 129.7 mL/min (by C-G formula based on SCr of 0.79 mg/dL).   Assessment: Anticoag: Coumadin  PTA for hx Afib + mech AVR, New CVA.  Discharged from [facility], received Lovenox 12/31 PM (family was not giving Coumadin) >> started Heparin, restart Coumadin 1/4.   Heparin level this evening within goal range.  No  overt bleeding or complications noted.  Goal of Therapy:  Heparin level 0.3-0.5 units/ml Monitor platelets by anticoagulation protocol: Yes  INR 2.5-3.5   Plan:  Continue IV heparin at current rate. Daily HL, INR and CBC  1/32, [facility] Clinical Pharmacist Phone [phone]  05/15/2019 7:36 PM

## 2019-05-15 NOTE — Progress Notes (Signed)
Occupational Therapy Note  Patient Details  Name: Jon [name] MRN: [id number] Date of Birth: 1965-08-21  Today's Date: 05/15/2019 OT Missed Time: 60 Minutes Missed Time Reason: Patient ill (comment);Patient fatigue  Pt missed 60 mins scheduled OT treatment session due to refusal.  Pt asleep with washcloth on forehead upon arrival.  Pt shaking head "no" when encouraged to participate.  Per PT and RN, pt with frequent loose stool and not feeling well this AM.  Will attempt to return later in day.  Will continue to follow per POC.   Rosalio Loud 05/15/2019, 12:25 PM

## 2019-05-15 NOTE — Progress Notes (Signed)
[facility] PHYSICAL MEDICINE & REHABILITATION PROGRESS NOTE  Subjective/Complaints: Patient seen laying in bed this morning.  He appears to indicate that he did not sleep well overnight?  Due to diarrhea.  ROS: Appears to start diarrhea.  Appears to deny CP, shortness of breath, nausea, vomiting, diarrhea.  Objective: Vital Signs: Blood pressure 108/69, pulse 72, temperature (!) 97.4 F (36.3 C), temperature source Axillary, resp. rate 16, height 5\' 11"  (1.803 m), weight 101.9 kg, SpO2 94 %. No results found. Recent Labs    05/14/19 0842 05/15/19 0543  WBC 15.6* 14.3*  HGB 10.4* 10.3*  HCT 31.6* 31.8*  PLT 249 266   Recent Labs    05/13/19 0330 05/14/19 0349  NA 138 137  K 3.4* 3.7  CL 103 103  CO2 25 24  GLUCOSE 132* 118*  BUN 7 8  CREATININE 0.79 0.79  CALCIUM 8.5* 8.6*    Physical Exam: BP 108/69 (BP Location: Left Arm)   Pulse 72   Temp (!) 97.4 F (36.3 C) (Axillary)   Resp 16   Ht 5\' 11"  (1.803 m)   Wt 101.9 kg   SpO2 94%   BMI 31.33 kg/m  Constitutional: No distress . Vital signs reviewed.  Obese. HENT: Normocephalic.  Atraumatic.  + NG. Eyes: EOMI. No discharge. Cardiovascular: No JVD. Respiratory: Normal effort.  No stridor.  + [place]. GI: Non-distended. Skin: Warm and dry.  Intact. Psych: Unable to assess due to dysarthria Musc: No edema in extremities.  No tenderness in extremities. Neurological: Alert Severe dysarthria Able to follow simple motor commands.  Motor: LUE/LLE: 4+/5 proximal distal RUE: 3/5 proximal distal RLE: Hip flexion, knee extension 3/5, dorsiflexion 4-/5   Assessment/Plan: 1. 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  Physiatrist is providing close team supervision and 24 hour management of active medical problems listed below.  Physiatrist and rehab team continue to assess barriers to  discharge/monitor patient progress toward functional and medical goals  Care Tool:  Bathing    Body parts bathed by patient: Right arm, Abdomen, Right upper leg, Left upper leg, Face   Body parts bathed by helper: Left arm, Front perineal area, Buttocks, Left lower leg, Right lower leg     Bathing assist Assist Level: Maximal Assistance - Patient 24 - 49%     Upper Body Dressing/Undressing Upper body dressing   What is the patient wearing?: Pull over shirt    Upper body assist Assist Level: Moderate Assistance - Patient 50 - 74%    Lower Body Dressing/Undressing Lower body dressing      What is the patient wearing?: Incontinence brief     Lower body assist Assist for lower body dressing: Maximal Assistance - Patient 25 - 49%     Toileting Toileting    Toileting assist Assist for toileting: Total Assistance - Patient < 25%     Transfers Chair/bed transfer  Transfers assist  Chair/bed transfer activity did not occur: N/A  Chair/bed transfer assist level: Moderate Assistance - Patient 50 - 74%     Locomotion Ambulation   Ambulation assist   Ambulation activity did not occur: Safety/medical concerns(fatigue, LE weakness, restricted to threapy in room due to IV fluids)          Walk 10 feet activity   Assist  Walk 10 feet activity did not occur: Safety/medical concerns(fatigue, LE weakness, restricted to threapy in room due to  IV fluids)        Walk 50 feet activity   Assist Walk 50 feet with 2 turns activity did not occur: Safety/medical concerns(fatigue, LE weakness, restricted to threapy in room due to IV fluids)         Walk 150 feet activity   Assist Walk 150 feet activity did not occur: Safety/medical concerns(fatigue, LE weakness, restricted to threapy in room due to IV fluids)         Walk 10 feet on uneven surface  activity   Assist Walk 10 feet on uneven surfaces activity did not occur: Safety/medical concerns(fatigue, LE  weakness, restricted to threapy in room due to IV fluids)         Wheelchair     Assist Will patient use wheelchair at discharge?: Yes Type of Wheelchair: Manual Wheelchair activity did not occur: Safety/medical concerns(fatigue, R hemi, restricted to threapy in room due to IV fluids)         Wheelchair 50 feet with 2 turns activity    Assist    Wheelchair 50 feet with 2 turns activity did not occur: Safety/medical concerns(fatigue, R hemi, restricted to threapy in room due to IV fluids)       Wheelchair 150 feet activity     Assist Wheelchair 150 feet activity did not occur: Safety/medical concerns(fatigue, R hemi, restricted to threapy in room due to IV fluids)          Medical Problem List and Plan: 1.  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  Continue CIR 2.  Antithrombotics: -DVT/anticoagulation:  Pharmaceutical:Heparin GGT bridge to Coumadin.  INR remains subtherapeutic, unfractionated heparin level within normal limits on 1/8             -antiplatelet therapy: on ASA 3. Pain Management: tylenol prn 4. Mood: LCSW to follow for evaluation and support when appropriate.              -antipsychotic agents: N/A 5. Neuropsych: This patient is not fully capable of making decisions on his own behalf. 6. Skin/Wound Care: Routine pressure relief measures.  7. Fluids/Electrolytes/Nutrition: NPO. Continue tube feeds, added water flushes.  8. Aspiration PNA: On Unasyn day #4/7. Needs to be upright with pulmonary toilet.    Wean supplemental oxygen as tolerated  9. CAF/CHB s/p PPM:   Monitor HR Off amiodarone and Entresto due to issues with hypotension.  11 COPD/OSA: Duonebs and incruse resumed. Encourage IS and keep HOB>30 degrees.   Communicated medication adjustments with respiratory therapy 12. Depression/anxiety disorder: Emotional  support 13. H/o migraines: Has been managed on Topamax and gabapentin.  14. Hypokalemia:   Continue Kdur   Potassium 3.7 on 1/7, labs ordered for Monday  Continue to monitor 15.  Hyperglycemia-secondary to tube feeds  Labile on 1/8  Continue to monitor 16.  Leukocytosis  WBCs 14.3 on 1/8, labs ordered for Monday  Afebrile  Continue to monitor 17.  Acute blood loss anemia  Hemoglobin 10.3 on 1/8, labs ordered for Monday  Continue to monitor 18.  Hypoalbuminemia  Adjust with tube feeds 19.  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  Reliability on 1/8 20.  Loose stools  1-2/day.  Will discuss changing tube feeds with dietary 21.  Post stroke dysphagia  NPO, advance as tolerated  Tolerating tube feed pump  LOS: 2 days A FACE TO FACE EVALUATION WAS PERFORMED  Ankit Lorie Phenix 05/15/2019, 9:55 AM

## 2019-05-15 NOTE — Progress Notes (Signed)
Physical Therapy Session Note  Patient Details  Name: Jon [name] MRN: [id number] Date of Birth: Aug 31, 1965  Today's Date: 05/15/2019 PT Individual Time: 8185-6314 PT Individual Time Calculation (min): 45 min  and Today's Date: 05/15/2019 PT Missed Time: 15 Minutes Missed Time Reason: Patient fatigue;Patient ill (Comment)(Pt felt nauseous, dizzy, and demonstrated excessive sweating)  Short Term Goals: Week 1:  PT Short Term Goal 1 (Week 1): Pt will perform bed mobility with min A PT Short Term Goal 2 (Week 1): Pt will transfer stand<>pivot with LRAD mod A PT Short Term Goal 3 (Week 1): Pt will perform car transfer mod A  Skilled Therapeutic Interventions/Progress Updates:   Received pt supine in bed, pt with severe dysarthria but able to use communication board to let therapist know of pain. Pt pointed to 5/10 pain on pain scale and pointed to his stomach. Therapist notified RN of pt's pain status and nursing reported Bolus tube feed upsets his stomach and attempting to make adjustments as able. Upon returning pt using communication board to communicate need to toilet. Pt on 4L O2 via [place] throughout session. Pt performed bed mobility with mod A for trunk control with HOB slightly elevated. Pt transferred stand<>pivot with RW to bedside commode mod A. Pt with loose stool and required total assist for peri-care and to don clean incontinence brief. Pt with poor standing balance with R lateral lean requiring min A to correct and unable to fully place RUE on RW. Pt with increased respiratory rate and demonstrating excessive sweating and decreased sitting balance requiring min/mod A to correct R lateral lean. O2 sat 89% and therapist educated pt on pursed lip breathing techniques. When asked if pt wanted to return to bed pt stated "yes" and pointed at bed. Pt transferred stand<>pivot max A without RW with poor eccentric control onto bed and immediately transitioning into supine. Therapist provided cool  wash cloth and pt used communication board to reports feeling nauseous, dizzy, and hot. BP 117/78 and HR 66 bpm O2 sat 93% in supine. Pt with difficulty keeping eyes open and when therapist asked if pt wanted to rest, pt nodded head. Concluded session with pt supine in bed, needs within reach, and bed alarm on. RN notified of pt's current status. 15 minutes missed of skilled physical therapy due to fatigue/illness.   Therapy Documentation Precautions:  Precautions Precautions: Fall Precaution Comments: R hemi, dysarthria, dysphagia, on 4L O2 via [place], NG tube, and NPO Restrictions Weight Bearing Restrictions: No   Therapy/Group: Individual Therapy Martin Majestic PT, DPT   05/15/2019, 7:32 AM

## 2019-05-15 NOTE — Progress Notes (Signed)
ANTICOAGULATION CONSULT NOTE - Follow Up Consult  Pharmacy Consult for Heparin Indication: Afib + mech AVR, New CVA  Allergies  Allergen Reactions  . Hydrocodone-Acetaminophen Other (See Comments)    PT MOTHER DOES NOT REMEMBER REACTION  Only use in Emergency   . Hydromorphone Itching and Rash       . Other Other (See Comments)    ALL NARCOTICS-ITCHING, RASH   . Oxycodone Other (See Comments)    Unknown reaction  . Penicillins Rash    Did it involve swelling of the face/tongue/throat, SOB, or low BP? Unknown Did it involve sudden or severe rash/hives, skin peeling, or any reaction on the inside of your mouth or nose? Unknown Did you need to seek medical attention at a hospital or doctor's office? Unknown When did it last happen?unknown If all above answers are "NO", may proceed with cephalosporin use.  . Dilaudid [Hydromorphone Hcl] Itching and Rash    Patient Measurements: Height: 5\' 11"  (180.3 cm) Weight: 224 lb 10.4 oz (101.9 kg) IBW/kg (Calculated) : 75.3 Heparin Dosing Weight: 97.7   Vital Signs: Temp: 97.4 F (36.3 C) (01/08 0406) Temp Source: Axillary (01/08 0406) BP: 108/69 (01/08 0406) Pulse Rate: 70 (01/08 0406)  Labs: Recent Labs    05/13/19 0330 05/14/19 0118 05/14/19 0349 05/14/19 0842 05/14/19 1738 05/15/19 0543  HGB 11.4* 7.7*  --  10.4*  --  10.3*  HCT 36.0* 24.0*  --  31.6*  --  31.8*  PLT 263 216  --  249  --  266  LABPROT 14.1 18.9*  --   --   --  19.4*  INR 1.1 1.6*  --   --   --  1.7*  HEPARINUNFRC 0.21* 0.27*  --  0.52 0.48 0.65  CREATININE 0.79  --  0.79  --   --   --     Estimated Creatinine Clearance: 129.7 mL/min (by C-G formula based on SCr of 0.79 mg/dL).   Assessment: Anticoag: Coumadin  PTA for hx Afib + mech AVR, New CVA.  Discharged from [place] [facility], received Lovenox 12/31 PM (family was not giving Coumadin) >> started Heparin, restart Coumadin 1/4. HL 0.65 now above goal.   Goal of Therapy:  Heparin level 0.3-0.5  units/ml Monitor platelets by anticoagulation protocol: Yes  INR 2.5-3.5   Plan:  Decrease Heparin drip to 1650 units/hr (16.5 ml/hr) Warfarin 7.5mg  PO x 1 HL in 6 hours Daily HL, INR and CBC   Amber Guthridge A. 1/32, PharmD, BCPS, FNKF Clinical Pharmacist [place] Please utilize Amion for appropriate phone number to reach the unit pharmacist [facility])   05/15/2019,7:59 AM

## 2019-05-15 NOTE — Progress Notes (Signed)
Pt very congested. Pt able to use oral suction by himself and tolerated well. Performed oral 2x during shift. Encouraged pt to deep breath and cough. [place] at 4 L in place. Pt was able to verbalize that he needed to have BM, bed pan applied, pt had 2 liquid stools after bolus feed.

## 2019-05-16 ENCOUNTER — Inpatient Hospital Stay (HOSPITAL_COMMUNITY): Payer: Medicaid Other | Admitting: Occupational Therapy

## 2019-05-16 ENCOUNTER — Inpatient Hospital Stay (HOSPITAL_COMMUNITY): Payer: Medicaid Other | Admitting: Speech Pathology

## 2019-05-16 ENCOUNTER — Inpatient Hospital Stay (HOSPITAL_COMMUNITY): Payer: Medicaid Other | Admitting: Physical Therapy

## 2019-05-16 DIAGNOSIS — I639 Cerebral infarction, unspecified: Secondary | ICD-10-CM

## 2019-05-16 LAB — PROTIME-INR
INR: 2.2 — ABNORMAL HIGH (ref 0.8–1.2)
Prothrombin Time: 24.7 seconds — ABNORMAL HIGH (ref 11.4–15.2)

## 2019-05-16 LAB — CBC
HCT: 30.7 % — ABNORMAL LOW (ref 39.0–52.0)
Hemoglobin: 9.7 g/dL — ABNORMAL LOW (ref 13.0–17.0)
MCH: 28.7 pg (ref 26.0–34.0)
MCHC: 31.6 g/dL (ref 30.0–36.0)
MCV: 90.8 fL (ref 80.0–100.0)
Platelets: 286 10*3/uL (ref 150–400)
RBC: 3.38 MIL/uL — ABNORMAL LOW (ref 4.22–5.81)
RDW: 17.3 % — ABNORMAL HIGH (ref 11.5–15.5)
WBC: 10.1 10*3/uL (ref 4.0–10.5)
nRBC: 0 % (ref 0.0–0.2)

## 2019-05-16 LAB — GLUCOSE, CAPILLARY
Glucose-Capillary: 110 mg/dL — ABNORMAL HIGH (ref 70–99)
Glucose-Capillary: 117 mg/dL — ABNORMAL HIGH (ref 70–99)
Glucose-Capillary: 123 mg/dL — ABNORMAL HIGH (ref 70–99)
Glucose-Capillary: 123 mg/dL — ABNORMAL HIGH (ref 70–99)
Glucose-Capillary: 133 mg/dL — ABNORMAL HIGH (ref 70–99)
Glucose-Capillary: 91 mg/dL (ref 70–99)

## 2019-05-16 LAB — HEPARIN LEVEL (UNFRACTIONATED): Heparin Unfractionated: 0.43 IU/mL (ref 0.30–0.70)

## 2019-05-16 MED ORDER — WARFARIN SODIUM 7.5 MG PO TABS
7.5000 mg | ORAL_TABLET | Freq: Once | ORAL | Status: AC
  Administered 2019-05-16: 7.5 mg via ORAL
  Filled 2019-05-16: qty 1

## 2019-05-16 MED ORDER — VITAL 1.5 CAL PO LIQD
1000.0000 mL | ORAL | Status: DC
  Administered 2019-05-16 – 2019-05-24 (×9): 1000 mL
  Filled 2019-05-16 (×17): qty 1000

## 2019-05-16 MED ORDER — PRO-STAT SUGAR FREE PO LIQD
30.0000 mL | Freq: Every day | ORAL | Status: DC
  Administered 2019-05-17 – 2019-05-25 (×9): 30 mL
  Filled 2019-05-16 (×9): qty 30

## 2019-05-16 NOTE — Progress Notes (Signed)
Occupational Therapy Session Note  Patient Details  Name: Jon [name] MRN: [id number] Date of Birth: Aug 06, 1965  Today's Date: 05/16/2019 OT Individual Time: 3419-3790 OT Individual Time Calculation (min): 56 min   Short Term Goals: Week 1:  OT Short Term Goal 1 (Week 1): Pt will use right UE at diminshed level in ADL tasks with min A OT Short Term Goal 2 (Week 1): Pt will don LB clothing (including brief and footwear) with mod A OT Short Term Goal 3 (Week 1): Pt will perform toileting with mod A sit to stand OT Short Term Goal 4 (Week 1): Pt will transfer to the toilet with min A.  Skilled Therapeutic Interventions/Progress Updates:    Pt greeted in bed with mother Patsy present. He was finishing up a phone conversation with a friend. OT tried to facilitate use of the communication board however pt motivated to verbalize. He stated that he wanted to "rest" and shook his head when given suggestions for OOB activities including toileting. He was agreeable to bedlevel tx. Therefore, played meaningful old country music for pt with focus on Rt NMR and activity tolerance. Pt attempting to sing along to familiar songs and able to verbalize many words. Guided him in forward and overhead hand clapping, pt shaking hips in bed for core strengthening, tapping feet, and initiating playing a guitar as if he was holding one. Min facilitation for bigger strumming patterns with the Rt hand. 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 Pt visibly fatigued during participation and requested to have several rest breaks. Mother Patsy then set him up with urinal but pt spilled it. Pt rolling with supervision assist while OT completed perihygiene and brief change. Pt able to perform hygiene in the front. Educated Patsy on hemi positioning strategies while pt is in bed to protect affected side. Provided pt with a small foam sponge, educated Patsy on how to facilitate use with him, emphasizing balance of flexor and extensor  activity. Also provided pt with an elbow protector. +2 for boosting up in bed. At end of session pt stated: "thank you for my session" and "I enjoyed myself." Left him with all needs within reach and 4 bedrails up per safety plan. Bed alarm set.    02 sats during tx >95% on 4L via [place]  Therapy Documentation Precautions:  Precautions Precautions: Fall Precaution Comments: R hemi, dysarthria, dysphagia, on 4L O2 via [place], NG tube, and NPO Restrictions Weight Bearing Restrictions: No Vital Signs: Therapy Vitals Temp: 97.7 F (36.5 C) Pulse Rate: 66 Resp: 20 BP: 120/71 Patient Position (if appropriate): Lying Oxygen Therapy SpO2: 100 % O2 Device: Nasal Cannula Pain: Pt denied having pain during tx   ADL: ADL Eating: NPO Grooming: Maximal assistance Upper Body Bathing: Moderate assistance Where Assessed-Upper Body Bathing: Wheelchair, Sitting at sink Lower Body Bathing: Maximal assistance Where Assessed-Lower Body Bathing: Wheelchair, Sitting at sink Upper Body Dressing: Moderate assistance Where Assessed-Upper Body Dressing: Wheelchair Lower Body Dressing: Maximal assistance Where Assessed-Lower Body Dressing: Wheelchair, Sitting at sink Toileting: Maximal assistance Where Assessed-Toileting: Teacher, adult education: Moderate assistance Toilet Transfer Method: Stand pivot Toilet Transfer Equipment: Acupuncturist: Not assessed      Therapy/Group: Individual Therapy  Versie Fleener A Nohely Whitehorn 05/16/2019, 4:00 PM

## 2019-05-16 NOTE — Progress Notes (Signed)
Pt slept mostly through night. Pt had 1 liquid stool around 0530. Denies pain.

## 2019-05-16 NOTE — Progress Notes (Signed)
Nutrition Follow-up  RD working remotely.  DOCUMENTATION CODES:   Obesity unspecified  INTERVENTION:   Tube feeding via Cortrak: - Vital 1.5 @ 75 ml/hr to run over 20 hours (can be held for up to 4 hours for therapies) - Pro-stat 30 ml daily - Free water per MD/PA, currently 100 ml QID  Tube feeding regimen provides 2350 kcal, 116 grams of protein, and 1546 ml of H2O.   NUTRITION DIAGNOSIS:   Inadequate oral intake related to dysphagia as evidenced by NPO status.  Ongoing, being addressed via TF  GOAL:   Patient will meet greater than or equal to 90% of their needs  Met via TF  MONITOR:   Diet advancement, Labs, Weight trends, TF tolerance  REASON FOR ASSESSMENT:   Consult Enteral/tube feeding initiation and management  ASSESSMENT:   54 year old male with PMH of CAF, CHB-s/p PPM, migraines, aortic dissection s/p AVR complicated by CVA with residual dysarthria, gait disorder and visual deficits. Pt presented on 05/08/19 with sudden right-sided weakness and left gaze deviation. CT head showed abnormal hypodensity left colossal body and possible pericallosal gyrus. 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 CTA chest showed diffuse lung opacity concerning for PNA and dissection or aneurysm . He underwent cerebral angiogram with attempted thrombectomy of left A2 occlusion without significant improvement in flow and nonflow limiting dissection of distal cervical segment identified. Neurology felt the stroke was embolic in setting of subtherapeutic INR. Pt was placed on dysphagia 1 diet with honey-thick liquids due to dysphagia on 05/10/19. Chest x-ray performed on 05/12/19 suggestive of aspiration pneumonia. Pt was made NPO and Cortrak placed for nutrition support. Pt admitted to CIR on  05/13/19.  Kangaroo pumps now available. Pt switched from bolus to continuous tube feeds on 01/08. PA and RN reached out to RD regarding pt having diarrhea. PA would like to consider switching TF formulas to see if it helps.  Cortrak tube remains in place. Weight stable.  Current TF: Osmolite 1.5 @ 60 ml/hr, Pro-stat 30 ml BID, free water 100 ml QID  Medications reviewed and include: SSI q 4 hours, protonix, Klor-con 20 mEq daily, warfarin, IV abx  Labs reviewed: hemoglobin 9.7 CBG's: 97-139 x 24 hours  Diet Order:   Diet Order            Diet NPO time specified  Diet effective now              EDUCATION NEEDS:   No education needs have been identified at this time  Skin:  Skin Assessment: Skin Integrity Issues: Skin Integrity Issues: Incisions: right groin  Last BM:  05/16/19 type 7  Height:   Ht Readings from Last 1 Encounters:  05/16/19 5' 11"  (1.803 m)    Weight:   Wt Readings from Last 1 Encounters:  05/16/19 101.5 kg    Ideal Body Weight:  78.2 kg  BMI:  Body mass index is 31.21 kg/m.  Estimated Nutritional Needs:   Kcal:  8563-1497  Protein:  115-130 grams  Fluid:  >/= 2.0 L    Gaynell Face, MS, RD, LDN Inpatient Clinical Dietitian Pager: [phone] Weekend/After Hours: [phone]

## 2019-05-16 NOTE — Progress Notes (Signed)
Speech Language Pathology Daily Session Note  Patient Details  Name: Jon [name] MRN: [id number] Date of Birth: [birth year]  Today's Date: 05/16/2019 SLP Individual Time: 9678-9381 SLP Individual Time Calculation (min): 52 min  Short Term Goals: Week 1: SLP Short Term Goal 1 (Week 1): Following improved chest x-rays and medical clearance, pt will particiate in instrumental swallow study to determine least restrictive diet. SLP Short Term Goal 2 (Week 1): Pt will utilize speech intelligibility strategies to increase intelligibility to 60% at the word level with Max A verbal cues. SLP Short Term Goal 3 (Week 1): Pt will initiate use of picture and/or letter communication board to communicate complex messages when he is not understood by staff in 3/5 opportunities with Min A cues. SLP Short Term Goal 4 (Week 1): Pt will demonstrate basic problem solving during functional tasks with Mod A verbal/visual cues.  Skilled Therapeutic Interventions: Patient received skilled SLP services targeting dysarthria. Patient's speech intelligibility strategies handout was present at bedside. Reviewed speech intelligibility strategies with patient. Patient repeated functional words to facilitate communication after clinician (pain, bed, phone etc) with 75% intelligibility requiring max verbal cues to increase loudness. Further targeted functional communication with patient naming family members (children, grandchildren, siblings) with 60% intelligibility requiring max verbal cues to over-articulate. Patient became easily frustrated by dysarthria requiring rest breaks between therapy tasks. Patient requesting water during therapy session. Further education was provided regarding rationale for NPO and the risks of aspiration. At the end of therapy session patient was upright in chair, chair alarm activated, and all needs within reach.  Pain Pain Assessment Pain Scale: 0-10 Pain Score: 0-No pain  Therapy/Group:  Individual Therapy  [name] 05/16/2019, 11:40 AM

## 2019-05-16 NOTE — Plan of Care (Signed)
  Problem: Consults Goal: RH STROKE PATIENT EDUCATION Description: See Patient Education module for education specifics  Outcome: Progressing Goal: Nutrition Consult-if indicated Outcome: Progressing Goal: Diabetes Guidelines if Diabetic/Glucose > 140 Description: If diabetic or lab glucose is > 140 mg/dl - Initiate Diabetes/Hyperglycemia Guidelines & Document Interventions  Outcome: Progressing   Problem: RH BOWEL ELIMINATION Goal: RH STG MANAGE BOWEL WITH ASSISTANCE Description: STG Manage Bowel with mod Assistance. Outcome: Progressing Goal: RH STG MANAGE BOWEL W/MEDICATION W/ASSISTANCE Description: STG Manage Bowel with Medication with min Assistance. Outcome: Progressing   Problem: RH BLADDER ELIMINATION Goal: RH STG MANAGE BLADDER WITH ASSISTANCE Description: STG Manage Bladder With mod Assistance Outcome: Progressing Goal: RH STG MANAGE BLADDER WITH MEDICATION WITH ASSISTANCE Description: STG Manage Bladder With Medication With min Assistance. Outcome: Progressing   Problem: RH SKIN INTEGRITY Goal: RH STG SKIN FREE OF INFECTION/BREAKDOWN Description: Skin to remain free from infection and breakdown while rehab with min assist. Outcome: Progressing Goal: RH STG ABLE TO PERFORM INCISION/WOUND CARE W/ASSISTANCE Description: STG Able To Perform Incision/Wound Care With min Assistance. Outcome: Progressing   Problem: RH SAFETY Goal: RH STG ADHERE TO SAFETY PRECAUTIONS W/ASSISTANCE/DEVICE Description: STG Adhere to Safety Precautions With mod Assistance and appropriate assistive Device. Outcome: Progressing   Problem: RH PAIN MANAGEMENT Goal: RH STG PAIN MANAGED AT OR BELOW PT'S PAIN GOAL Description: <3 on a 0-10 pain scale Outcome: Progressing   Problem: RH KNOWLEDGE DEFICIT Goal: RH STG INCREASE KNOWLEDGE OF DIABETES Description: Patient and caregiver will increase knowledge on diabetic medications, dietary restrictions, and follow-up care with the MD post discharge  with min assist from staff. Outcome: Progressing Goal: RH STG INCREASE KNOWLEDGE OF HYPERTENSION Description: Patient and caregiver will increase knowledge on HTN medications, dietary restrictions, and follow-up care with the MD post discharge with min assist from staff.  Outcome: Progressing Goal: RH STG INCREASE KNOWLEDGE OF DYSPHAGIA/FLUID INTAKE Description: Patient and caregiver will increase knowledge of safe swallowing techniques to prevent aspiration pneumonia; increase knowledge of dysphagia diets, and thickened liquids with min assist from staff.  Outcome: Progressing Goal: RH STG INCREASE KNOWLEGDE OF HYPERLIPIDEMIA Description: Patient and caregiver will increase knowledge on HLD medications, dietary restrictions, and follow-up care with the MD post discharge with min assist from staff.  Outcome: Progressing Goal: RH STG INCREASE KNOWLEDGE OF STROKE PROPHYLAXIS Description: Patient and caregiver will increase knowledge on Stroke prevention medications, dietary restrictions, and follow-up care with the MD post discharge with min assist from staff.  Outcome: Progressing   Problem: RH Vision Goal: RH LTG Vision (Specify) Outcome: Progressing

## 2019-05-16 NOTE — Progress Notes (Signed)
ANTICOAGULATION CONSULT NOTE - Follow Up Consult  Pharmacy Consult for Heparin Indication: Afib + mech AVR, New CVA  Allergies  Allergen Reactions  . Hydrocodone-Acetaminophen Other (See Comments)    PT MOTHER DOES NOT REMEMBER REACTION  Only use in Emergency   . Hydromorphone Itching and Rash       . Other Other (See Comments)    ALL NARCOTICS-ITCHING, RASH   . Oxycodone Other (See Comments)    Unknown reaction  . Penicillins Rash    Did it involve swelling of the face/tongue/throat, SOB, or low BP? Unknown Did it involve sudden or severe rash/hives, skin peeling, or any reaction on the inside of your mouth or nose? Unknown Did you need to seek medical attention at a hospital or doctor's office? Unknown When did it last happen?unknown If all above answers are "NO", may proceed with cephalosporin use.  . Dilaudid [Hydromorphone Hcl] Itching and Rash    Patient Measurements: Height: 5\' 11"  (180.3 cm) Weight: 223 lb 12.3 oz (101.5 kg) IBW/kg (Calculated) : 75.3 Heparin Dosing Weight: 97.7   Vital Signs: Temp: 98 F (36.7 C) (01/09 0500) BP: 127/69 (01/09 0500) Pulse Rate: 60 (01/09 0500)  Labs: Recent Labs    05/14/19 0118 05/14/19 0349 05/14/19 0842 05/15/19 0543 05/15/19 1811 05/16/19 0609  HGB 7.7*  --  10.4* 10.3*  --  9.7*  HCT 24.0*  --  31.6* 31.8*  --  30.7*  PLT 216  --  249 266  --  286  LABPROT 18.9*  --   --  19.4*  --  24.7*  INR 1.6*  --   --  1.7*  --  2.2*  HEPARINUNFRC 0.27*  --  0.52 0.65 0.49 0.43  CREATININE  --  0.79  --   --   --   --     Estimated Creatinine Clearance: 129.6 mL/min (by C-G formula based on SCr of 0.79 mg/dL).   Assessment: Anticoag: Coumadin PTA for hx Afib + mech AVR, New CVA.  Discharged from [facility], received Lovenox 12/31 PM (family was not giving Coumadin) >> started Heparin, restart Coumadin 1/4.   Heparin level remains within goal range at 0.43. INR remain subtherapeutic at 2.2. H/H stable No overt  bleeding or infusion issues noted.  Goal of Therapy:  Heparin level 0.3-0.5 units/ml Monitor platelets by anticoagulation protocol: Yes  INR 2.5-3.5   Plan:  Con't Heparin drip at 1650 units/hr  Warfarin 7.5 mg x1 @ 1800 Daily HL, INR and CBC  1/32, PharmD PGY1 Ambulatory Care Resident [employer] # [phone]

## 2019-05-16 NOTE — Progress Notes (Signed)
Physical Therapy Session Note  Patient Details  Name: Jon [name] MRN: [id number] Date of Birth: [birth year]  Today's Date: 05/16/2019 PT Individual Time: 0803-0903 PT Individual Time Calculation (min): 60 min   Short Term Goals: Week 1:  PT Short Term Goal 1 (Week 1): Pt will perform bed mobility with min A PT Short Term Goal 2 (Week 1): Pt will transfer stand<>pivot with LRAD mod A PT Short Term Goal 3 (Week 1): Pt will perform car transfer mod A  Skilled Therapeutic Interventions/Progress Updates:  Pt was seen bedside in the am. Pt assisted with donning underwear and pants. Increased work of breathing with any activity, O2 sats monitored throughout treatment greater than 95%. Pt transferred supine to edge of bed with mod A and verbal cues. Pt tolerated edge of bed about 10 minutes with close supervision to c/g. Pt transferred sit to stand with min to mod A and transferred to w/c with mod A and verbal cues. RT at bedside to perform breathing treatment. Pt transported to rehab gym. Pt performed car transfers with mod A and verbal cues with increased time to recover. Pt performed stair training, 4 stairs with B rails and +2 assist. Pt performed stairs with mod A however required second person due to multiple lines and tubes. Pt ambulated 25 feet with rolling walker and +2 Assist. Pt performed ambulation with mod A and verbal cues however required second person to assist with multiple lines and tubes. Pt returned to room and left sitting up in w/c with seat belt alarm in place and call bell within reach.   Therapy Documentation Precautions:  Precautions Precautions: Fall Precaution Comments: R hemi, dysarthria, dysphagia, on 4L O2 via [place], NG tube, and NPO Restrictions Weight Bearing Restrictions: No General:   Vital Signs: Oxygen Therapy SpO2: 98 % O2 Flow Rate (L/min): 4 L/min Pain: Pain Assessment Pain Scale: 0-10 Pain Score: 0-No pain  Therapy/Group: Individual  Therapy  Rayford Halsted 05/16/2019, 12:21 PM

## 2019-05-16 NOTE — Progress Notes (Signed)
[facility] PHYSICAL MEDICINE & REHABILITATION PROGRESS NOTE  Subjective/Complaints:  No issues overnight, voice is still wet and gurgly, no cough.  Mother at bedside, she remains optimistic that he will not need a G-tube.  ROS:.  Appears to deny CP, shortness of breath, nausea, vomiting, diarrhea.  Objective: Vital Signs: Blood pressure 127/69, pulse 60, temperature 98 F (36.7 C), resp. rate (!) 22, height 5\' 11"  (1.803 m), weight 101.5 kg, SpO2 98 %. No results found. Recent Labs    05/15/19 0543 05/16/19 0609  WBC 14.3* 10.1  HGB 10.3* 9.7*  HCT 31.8* 30.7*  PLT 266 286   Recent Labs    05/14/19 0349  NA 137  K 3.7  CL 103  CO2 24  GLUCOSE 118*  BUN 8  CREATININE 0.79  CALCIUM 8.6*    Physical Exam: BP 127/69 (BP Location: Left Arm)   Pulse 60   Temp 98 F (36.7 C)   Resp (!) 22   Ht 5\' 11"  (1.803 m)   Wt 101.5 kg   SpO2 98%   BMI 31.21 kg/m  Constitutional: No distress . Vital signs reviewed.  Obese. HENT: Normocephalic.  Atraumatic.  + NG. Eyes: EOMI. No discharge. Cardiovascular: No JVD. Respiratory: Normal effort.  No stridor.  + [employer]. GI: Non-distended. Skin: Warm and dry.  Intact. Psych: Unable to assess due to dysarthria Musc: No edema in extremities.  No tenderness in extremities. Neurological: Alert Severe dysarthria Able to follow simple motor commands.  Motor: LUE/LLE: 4+/5 proximal distal RUE: 3/5 proximal distal RLE: Hip flexion, knee extension 3/5, dorsiflexion 4-/5   Assessment/Plan: 1. 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  Physiatrist is providing close team supervision and 24 hour management of active medical problems listed below.  Physiatrist and rehab team continue to assess barriers to discharge/monitor patient progress toward functional and medical goals  Care Tool:  Bathing    Body  parts bathed by patient: Right arm, Abdomen, Right upper leg, Left upper leg, Face   Body parts bathed by helper: Left arm, Front perineal area, Buttocks, Left lower leg, Right lower leg     Bathing assist Assist Level: Maximal Assistance - Patient 24 - 49%     Upper Body Dressing/Undressing Upper body dressing   What is the patient wearing?: Pull over shirt    Upper body assist Assist Level: Moderate Assistance - Patient 50 - 74%    Lower Body Dressing/Undressing Lower body dressing      What is the patient wearing?: Incontinence brief     Lower body assist Assist for lower body dressing: Maximal Assistance - Patient 25 - 49%     Toileting Toileting    Toileting assist Assist for toileting: Total Assistance - Patient < 25%     Transfers Chair/bed transfer  Transfers assist  Chair/bed transfer activity did not occur: N/A  Chair/bed transfer assist level: Moderate Assistance - Patient 50 - 74%     Locomotion Ambulation   Ambulation assist   Ambulation activity did not occur: Safety/medical concerns(fatigue, LE weakness, restricted to threapy in room due to IV fluids)  Assist level: 2 helpers Assistive device: Walker-rolling Max distance: 25   Walk 10 feet activity   Assist  Walk 10 feet activity did not occur: Safety/medical concerns(fatigue, LE weakness, restricted to threapy in room due to IV fluids)  Assist level: 2 helpers Assistive device: 07/12/19  Walk 50 feet activity   Assist Walk 50 feet with 2 turns activity did not occur: Safety/medical concerns(fatigue, LE weakness, restricted to threapy in room due to IV fluids)         Walk 150 feet activity   Assist Walk 150 feet activity did not occur: Safety/medical concerns(fatigue, LE weakness, restricted to threapy in room due to IV fluids)         Walk 10 feet on uneven surface  activity   Assist Walk 10 feet on uneven surfaces activity did not occur: Safety/medical  concerns(fatigue, LE weakness, restricted to threapy in room due to IV fluids)         Wheelchair     Assist Will patient use wheelchair at discharge?: Yes Type of Wheelchair: Manual Wheelchair activity did not occur: Safety/medical concerns(fatigue, R hemi, restricted to threapy in room due to IV fluids)         Wheelchair 50 feet with 2 turns activity    Assist    Wheelchair 50 feet with 2 turns activity did not occur: Safety/medical concerns(fatigue, R hemi, restricted to threapy in room due to IV fluids)       Wheelchair 150 feet activity     Assist Wheelchair 150 feet activity did not occur: Safety/medical concerns(fatigue, R hemi, restricted to threapy in room due to IV fluids)          Medical Problem List and Plan: 1.  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  Continue CIR 2.  Antithrombotics: -DVT/anticoagulation:  Pharmaceutical:Heparin GGT bridge to Coumadin.  INR remains subtherapeutic, unfractionated heparin level within normal limits on 1/8             -antiplatelet therapy: on ASA 3. Pain Management: tylenol prn 4. Mood: LCSW to follow for evaluation and support when appropriate.              -antipsychotic agents: N/A 5. Neuropsych: This patient is not fully capable of making decisions on his own behalf. 6. Skin/Wound Care: Routine pressure relief measures.  7. Fluids/Electrolytes/Nutrition: NPO. Continue tube feeds, added water flushes.  8. Aspiration PNA: On Unasyn day #5/7. Needs to be upright with pulmonary toilet.    Wean supplemental oxygen as tolerated  9. CAF/CHB s/p PPM:   Monitor HR Off amiodarone and Entresto due to issues with hypotension.  11 COPD/OSA: Duonebs and incruse resumed. Encourage IS and keep HOB>30 degrees.   Communicated medication adjustments with respiratory therapy 12. Depression/anxiety  disorder: Emotional support 13. H/o migraines: Has been managed on Topamax and gabapentin.  14. Hypokalemia:   Continue Kdur   Potassium 3.7 on 1/7, labs ordered for Monday  Continue to monitor 15.  Hyperglycemia-secondary to tube feeds  Labile on 1/8  Continue to monitor 16.  Leukocytosis resolved, WBC is 10.1 on 1/9  WBCs 14.3 on 1/8, l  Afebrile  Continue to monitor 17.  Acute blood loss anemia  Hemoglobin 10.3 on 1/8, 9.7 on 1/9, relatively stable  Continue to monitor 18.  Hypoalbuminemia  Adjust with tube feeds 19.  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  Reliability on 1/8 however stable value on 1/9 20.  Loose stools  1-2/day.  Will discuss changing tube feeds with dietary 21.  Post stroke dysphagia  NPO, advance as tolerated  Tolerating tube feed pump, as discussed with the patient's mother will need another modified barium swallow before deciding whether patient needs G-tube  LOS: 3 days A FACE TO FACE EVALUATION WAS PERFORMED  Charlett Blake 05/16/2019, 1:16 PM

## 2019-05-17 LAB — CBC
HCT: 31.4 % — ABNORMAL LOW (ref 39.0–52.0)
Hemoglobin: 9.7 g/dL — ABNORMAL LOW (ref 13.0–17.0)
MCH: 28.4 pg (ref 26.0–34.0)
MCHC: 30.9 g/dL (ref 30.0–36.0)
MCV: 92.1 fL (ref 80.0–100.0)
Platelets: 301 10*3/uL (ref 150–400)
RBC: 3.41 MIL/uL — ABNORMAL LOW (ref 4.22–5.81)
RDW: 17.2 % — ABNORMAL HIGH (ref 11.5–15.5)
WBC: 10.5 10*3/uL (ref 4.0–10.5)
nRBC: 0 % (ref 0.0–0.2)

## 2019-05-17 LAB — PROTIME-INR
INR: 2.7 — ABNORMAL HIGH (ref 0.8–1.2)
Prothrombin Time: 28.7 seconds — ABNORMAL HIGH (ref 11.4–15.2)

## 2019-05-17 LAB — GLUCOSE, CAPILLARY
Glucose-Capillary: 107 mg/dL — ABNORMAL HIGH (ref 70–99)
Glucose-Capillary: 109 mg/dL — ABNORMAL HIGH (ref 70–99)
Glucose-Capillary: 116 mg/dL — ABNORMAL HIGH (ref 70–99)
Glucose-Capillary: 129 mg/dL — ABNORMAL HIGH (ref 70–99)
Glucose-Capillary: 89 mg/dL (ref 70–99)
Glucose-Capillary: 90 mg/dL (ref 70–99)

## 2019-05-17 LAB — HEPARIN LEVEL (UNFRACTIONATED): Heparin Unfractionated: 0.55 IU/mL (ref 0.30–0.70)

## 2019-05-17 MED ORDER — WARFARIN SODIUM 7.5 MG PO TABS
7.5000 mg | ORAL_TABLET | Freq: Once | ORAL | Status: AC
  Administered 2019-05-17: 7.5 mg via ORAL
  Filled 2019-05-17: qty 1

## 2019-05-17 MED ORDER — IPRATROPIUM-ALBUTEROL 0.5-2.5 (3) MG/3ML IN SOLN
3.0000 mL | Freq: Two times a day (BID) | RESPIRATORY_TRACT | Status: DC
  Administered 2019-05-17 – 2019-05-30 (×26): 3 mL via RESPIRATORY_TRACT
  Filled 2019-05-17 (×25): qty 3

## 2019-05-17 NOTE — Plan of Care (Signed)
  Problem: Consults Goal: RH STROKE PATIENT EDUCATION Description: See Patient Education module for education specifics  Outcome: Progressing Goal: Nutrition Consult-if indicated Outcome: Progressing Goal: Diabetes Guidelines if Diabetic/Glucose > 140 Description: If diabetic or lab glucose is > 140 mg/dl - Initiate Diabetes/Hyperglycemia Guidelines & Document Interventions  Outcome: Progressing   Problem: RH BOWEL ELIMINATION Goal: RH STG MANAGE BOWEL WITH ASSISTANCE Description: STG Manage Bowel with mod Assistance. Outcome: Progressing Goal: RH STG MANAGE BOWEL W/MEDICATION W/ASSISTANCE Description: STG Manage Bowel with Medication with min Assistance. Outcome: Progressing   Problem: RH BLADDER ELIMINATION Goal: RH STG MANAGE BLADDER WITH ASSISTANCE Description: STG Manage Bladder With mod Assistance Outcome: Progressing Goal: RH STG MANAGE BLADDER WITH MEDICATION WITH ASSISTANCE Description: STG Manage Bladder With Medication With min Assistance. Outcome: Progressing   Problem: RH SKIN INTEGRITY Goal: RH STG SKIN FREE OF INFECTION/BREAKDOWN Description: Skin to remain free from infection and breakdown while rehab with min assist. Outcome: Progressing Goal: RH STG ABLE TO PERFORM INCISION/WOUND CARE W/ASSISTANCE Description: STG Able To Perform Incision/Wound Care With min Assistance. Outcome: Progressing   Problem: RH SAFETY Goal: RH STG ADHERE TO SAFETY PRECAUTIONS W/ASSISTANCE/DEVICE Description: STG Adhere to Safety Precautions With mod Assistance and appropriate assistive Device. Outcome: Progressing   Problem: RH PAIN MANAGEMENT Goal: RH STG PAIN MANAGED AT OR BELOW PT'S PAIN GOAL Description: <3 on a 0-10 pain scale Outcome: Progressing   Problem: RH KNOWLEDGE DEFICIT Goal: RH STG INCREASE KNOWLEDGE OF DIABETES Description: Patient and caregiver will increase knowledge on diabetic medications, dietary restrictions, and follow-up care with the MD post discharge  with min assist from staff. Outcome: Progressing Goal: RH STG INCREASE KNOWLEDGE OF HYPERTENSION Description: Patient and caregiver will increase knowledge on HTN medications, dietary restrictions, and follow-up care with the MD post discharge with min assist from staff.  Outcome: Progressing Goal: RH STG INCREASE KNOWLEDGE OF DYSPHAGIA/FLUID INTAKE Description: Patient and caregiver will increase knowledge of safe swallowing techniques to prevent aspiration pneumonia; increase knowledge of dysphagia diets, and thickened liquids with min assist from staff.  Outcome: Progressing Goal: RH STG INCREASE KNOWLEGDE OF HYPERLIPIDEMIA Description: Patient and caregiver will increase knowledge on HLD medications, dietary restrictions, and follow-up care with the MD post discharge with min assist from staff.  Outcome: Progressing Goal: RH STG INCREASE KNOWLEDGE OF STROKE PROPHYLAXIS Description: Patient and caregiver will increase knowledge on Stroke prevention medications, dietary restrictions, and follow-up care with the MD post discharge with min assist from staff.  Outcome: Progressing   Problem: RH Vision Goal: RH LTG Vision (Specify) Outcome: Progressing   

## 2019-05-17 NOTE — Progress Notes (Signed)
[facility] PHYSICAL MEDICINE & REHABILITATION PROGRESS NOTE  Subjective/Complaints:  Patient is watching football, could understand [place] but very dysarthric speech mainly incomprehensible.  ROS:.  Appears to deny CP, shortness of breath, nausea, vomiting, diarrhea.  Objective: Vital Signs: Blood pressure 138/76, pulse 64, temperature 97.7 F (36.5 C), resp. rate 18, height 5\' 11"  (1.803 m), weight 100.9 kg, SpO2 98 %. No results found. Recent Labs    05/16/19 0609 05/17/19 0655  WBC 10.1 10.5  HGB 9.7* 9.7*  HCT 30.7* 31.4*  PLT 286 301   No results for input(s): NA, K, CL, CO2, GLUCOSE, BUN, CREATININE, CALCIUM in the last 72 hours.  Physical Exam: BP 138/76 (BP Location: Left Arm)   Pulse 64   Temp 97.7 F (36.5 C)   Resp 18   Ht 5\' 11"  (1.803 m)   Wt 100.9 kg   SpO2 98%   BMI 31.02 kg/m  Constitutional: No distress . Vital signs reviewed.  Obese. HENT: Normocephalic.  Atraumatic.  + NG. Eyes: EOMI. No discharge. Cardiovascular: No JVD. Respiratory: Normal effort.  No stridor.  + [place]. GI: Non-distended. Skin: Warm and dry.  Intact. Psych: Unable to assess due to dysarthria Musc: No edema in extremities.  No tenderness in extremities. Neurological: Alert Severe dysarthria Able to follow simple motor commands.  Motor: LUE/LLE: 4+/5 proximal distal RUE: 3/5 proximal distal RLE: Hip flexion, knee extension 3/5, dorsiflexion 4-/5   Assessment/Plan: 1. 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  Physiatrist is providing close team supervision and 24 hour management of active medical problems listed below.  Physiatrist and rehab team continue to assess barriers to discharge/monitor patient progress toward functional and medical goals  Care Tool:  Bathing    Body parts bathed by patient: Right arm, Abdomen, Right upper leg,  Left upper leg, Face   Body parts bathed by helper: Left arm, Front perineal area, Buttocks, Left lower leg, Right lower leg     Bathing assist Assist Level: Maximal Assistance - Patient 24 - 49%     Upper Body Dressing/Undressing Upper body dressing   What is the patient wearing?: Pull over shirt    Upper body assist Assist Level: Moderate Assistance - Patient 50 - 74%    Lower Body Dressing/Undressing Lower body dressing      What is the patient wearing?: Incontinence brief     Lower body assist Assist for lower body dressing: Maximal Assistance - Patient 25 - 49%     Toileting Toileting    Toileting assist Assist for toileting: Total Assistance - Patient < 25%     Transfers Chair/bed transfer  Transfers assist  Chair/bed transfer activity did not occur: N/A  Chair/bed transfer assist level: Moderate Assistance - Patient 50 - 74%     Locomotion Ambulation   Ambulation assist   Ambulation activity did not occur: Safety/medical concerns(fatigue, LE weakness, restricted to threapy in room due to IV fluids)  Assist level: 2 helpers Assistive device: Walker-rolling Max distance: 25   Walk 10 feet activity   Assist  Walk 10 feet activity did not occur: Safety/medical concerns(fatigue, LE weakness, restricted to threapy in room due to IV fluids)  Assist level: 2 helpers Assistive device: Walker-rolling   Walk 50 feet activity   Assist Walk 50 feet with 2 turns activity did not occur: Safety/medical concerns(fatigue, LE weakness, restricted to threapy in room due to IV  fluids)         Walk 150 feet activity   Assist Walk 150 feet activity did not occur: Safety/medical concerns(fatigue, LE weakness, restricted to threapy in room due to IV fluids)         Walk 10 feet on uneven surface  activity   Assist Walk 10 feet on uneven surfaces activity did not occur: Safety/medical concerns(fatigue, LE weakness, restricted to threapy in room due to IV  fluids)         Wheelchair     Assist Will patient use wheelchair at discharge?: Yes Type of Wheelchair: Manual Wheelchair activity did not occur: Safety/medical concerns(fatigue, R hemi, restricted to threapy in room due to IV fluids)         Wheelchair 50 feet with 2 turns activity    Assist    Wheelchair 50 feet with 2 turns activity did not occur: Safety/medical concerns(fatigue, R hemi, restricted to threapy in room due to IV fluids)       Wheelchair 150 feet activity     Assist Wheelchair 150 feet activity did not occur: Safety/medical concerns(fatigue, R hemi, restricted to threapy in room due to IV fluids)          Medical Problem List and Plan: 1.  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  Continue CIR, PT, OT, speech therapy 2.  Antithrombotics: -DVT/anticoagulation:  Pharmaceutical:Heparin GGT bridge to Coumadin.  INR 2.7, in therapeutic pubic range of 2.5-3.5, will overlap 1 more day prior to discontinuation of heparin            -antiplatelet therapy: on ASA 3. Pain Management: tylenol prn 4. Mood: LCSW to follow for evaluation and support when appropriate.              -antipsychotic agents: N/A 5. Neuropsych: This patient is not fully capable of making decisions on his own behalf. 6. Skin/Wound Care: Routine pressure relief measures.  7. Fluids/Electrolytes/Nutrition: NPO. Continue tube feeds, added water flushes.  8. Aspiration PNA: On Unasyn day #5/7. Needs to be upright with pulmonary toilet.    Wean supplemental oxygen as tolerated  9. CAF/CHB s/p PPM:   Monitor HR Off amiodarone and Entresto due to issues with hypotension.  11 COPD/OSA: Duonebs and incruse resumed. Encourage IS and keep HOB>30 degrees.   Communicated medication adjustments with respiratory therapy 12. Depression/anxiety disorder: Emotional  support 13. H/o migraines: Has been managed on Topamax and gabapentin.  14. Hypokalemia:   Continue Kdur   Potassium 3.7 on 1/7, labs ordered for Monday  Continue to monitor 15.  Hyperglycemia-secondary to tube feeds  Labile on 1/8  Continue to monitor 16.  Leukocytosis resolved, WBC is 10.1 on 1/9  WBCs 14.3 on 1/8, l  Afebrile  Continue to monitor 17.  Acute blood loss anemia  Hemoglobin 10.3 on 1/8, 9.7 on 1/9, relatively stable  Continue to monitor 18.  Hypoalbuminemia  Adjust with tube feeds 19.  Combined congestive heart failure  EF 25-30%  Daily weights  No sign of fluid overload at this time, continue to monitor Filed Weights   05/15/19 0406 05/16/19 0500 05/17/19 0423  Weight: 101.9 kg 101.5 kg 100.9 kg    20.  Loose stools  1-2/day.  Will discuss changing tube feeds with dietary 21.  Post stroke dysphagia  NPO, advance as tolerated  Tolerating tube feed pump, as discussed with the patient's mother will need another modified barium  swallow before deciding whether patient needs G-tube  LOS: 4 days A FACE TO FACE EVALUATION WAS PERFORMED  Charlett Blake 05/17/2019, 2:46 PM

## 2019-05-17 NOTE — Progress Notes (Addendum)
ANTICOAGULATION CONSULT NOTE - Follow Up Consult  Pharmacy Consult for Heparin Indication: Afib + mech AVR, New CVA  Allergies  Allergen Reactions  . Hydrocodone-Acetaminophen Other (See Comments)    PT MOTHER DOES NOT REMEMBER REACTION  Only use in Emergency   . Hydromorphone Itching and Rash       . Other Other (See Comments)    ALL NARCOTICS-ITCHING, RASH   . Oxycodone Other (See Comments)    Unknown reaction  . Penicillins Rash    Did it involve swelling of the face/tongue/throat, SOB, or low BP? Unknown Did it involve sudden or severe rash/hives, skin peeling, or any reaction on the inside of your mouth or nose? Unknown Did you need to seek medical attention at a hospital or doctor's office? Unknown When did it last happen?unknown If all above answers are "NO", may proceed with cephalosporin use.  . Dilaudid [Hydromorphone Hcl] Itching and Rash    Patient Measurements: Height: 5\' 11"  (180.3 cm) Weight: 222 lb 7.1 oz (100.9 kg) IBW/kg (Calculated) : 75.3 Heparin Dosing Weight: 97.7   Vital Signs: BP: 138/76 (01/10 0317) Pulse Rate: 64 (01/10 0317)  Labs: Recent Labs    05/15/19 0543 05/15/19 1811 05/16/19 0609 05/17/19 0655  HGB 10.3*  --  9.7* 9.7*  HCT 31.8*  --  30.7* 31.4*  PLT 266  --  286 301  LABPROT 19.4*  --  24.7* 28.7*  INR 1.7*  --  2.2* 2.7*  HEPARINUNFRC 0.65 0.49 0.43 0.55    Estimated Creatinine Clearance: 129.1 mL/min (by C-G formula based on SCr of 0.79 mg/dL).   Assessment: Anticoag: Coumadin PTA for hx Afib + mech AVR, New CVA.  Discharged from [place], received Lovenox 12/31 PM (family was not giving Coumadin) >> started Heparin, restart Coumadin 1/4.   Heparin level is above goal range at 0.55. INR therapeutic at 2.7. H/H stable. No overt bleeding or infusion issues noted.   Goal of Therapy:  Heparin level 0.3-0.5 units/ml Monitor platelets by anticoagulation protocol: Yes  INR 2.5-3.5   Plan:  Decrease Heparin drip  to1600 units/hr  Warfarin 7.5 mg x1 @ 1800 Daily HL, INR and CBC If INR therapeutic tmrw (1/11), plan to d/c heparin bridge  07-26-1997, PharmD PGY1 Ambulatory Care Resident [employer] # [phone]

## 2019-05-18 ENCOUNTER — Inpatient Hospital Stay (HOSPITAL_COMMUNITY): Payer: Medicaid Other | Admitting: Occupational Therapy

## 2019-05-18 ENCOUNTER — Inpatient Hospital Stay (HOSPITAL_COMMUNITY): Payer: Medicaid Other | Admitting: Speech Pathology

## 2019-05-18 ENCOUNTER — Inpatient Hospital Stay (HOSPITAL_COMMUNITY): Payer: Medicaid Other

## 2019-05-18 DIAGNOSIS — I5042 Chronic combined systolic (congestive) and diastolic (congestive) heart failure: Secondary | ICD-10-CM

## 2019-05-18 LAB — GLUCOSE, CAPILLARY
Glucose-Capillary: 104 mg/dL — ABNORMAL HIGH (ref 70–99)
Glucose-Capillary: 104 mg/dL — ABNORMAL HIGH (ref 70–99)
Glucose-Capillary: 106 mg/dL — ABNORMAL HIGH (ref 70–99)
Glucose-Capillary: 111 mg/dL — ABNORMAL HIGH (ref 70–99)
Glucose-Capillary: 115 mg/dL — ABNORMAL HIGH (ref 70–99)
Glucose-Capillary: 127 mg/dL — ABNORMAL HIGH (ref 70–99)
Glucose-Capillary: 76 mg/dL (ref 70–99)

## 2019-05-18 LAB — CBC
HCT: 33.7 % — ABNORMAL LOW (ref 39.0–52.0)
Hemoglobin: 10.6 g/dL — ABNORMAL LOW (ref 13.0–17.0)
MCH: 28.8 pg (ref 26.0–34.0)
MCHC: 31.5 g/dL (ref 30.0–36.0)
MCV: 91.6 fL (ref 80.0–100.0)
Platelets: 318 10*3/uL (ref 150–400)
RBC: 3.68 MIL/uL — ABNORMAL LOW (ref 4.22–5.81)
RDW: 17.2 % — ABNORMAL HIGH (ref 11.5–15.5)
WBC: 11.3 10*3/uL — ABNORMAL HIGH (ref 4.0–10.5)
nRBC: 0 % (ref 0.0–0.2)

## 2019-05-18 LAB — BASIC METABOLIC PANEL
Anion gap: 9 (ref 5–15)
BUN: 8 mg/dL (ref 6–20)
CO2: 24 mmol/L (ref 22–32)
Calcium: 8.6 mg/dL — ABNORMAL LOW (ref 8.9–10.3)
Chloride: 104 mmol/L (ref 98–111)
Creatinine, Ser: 0.86 mg/dL (ref 0.61–1.24)
GFR calc Af Amer: >60 mL/min (ref >60)
GFR calc non Af Amer: >60 mL/min (ref >60)
Glucose, Bld: 118 mg/dL — ABNORMAL HIGH (ref 70–99)
Potassium: 4.6 mmol/L (ref 3.5–5.1)
Sodium: 137 mmol/L (ref 135–145)

## 2019-05-18 LAB — HEPARIN LEVEL (UNFRACTIONATED): Heparin Unfractionated: 0.43 IU/mL (ref 0.30–0.70)

## 2019-05-18 LAB — PROTIME-INR
INR: 2.7 — ABNORMAL HIGH (ref 0.8–1.2)
Prothrombin Time: 28.4 seconds — ABNORMAL HIGH (ref 11.4–15.2)

## 2019-05-18 MED ORDER — WARFARIN SODIUM 7.5 MG PO TABS
7.5000 mg | ORAL_TABLET | Freq: Once | ORAL | Status: AC
  Administered 2019-05-18: 7.5 mg via ORAL
  Filled 2019-05-18: qty 1

## 2019-05-18 NOTE — Progress Notes (Signed)
Speech Language Pathology Daily Session Note  Patient Details  Name: Jon [name] MRN: [id number] Date of Birth: 1965-05-24  Today's Date: 05/18/2019 SLP Individual Time: 7622-6333 SLP Individual Time Calculation (min): 44 min  Short Term Goals: Week 1: SLP Short Term Goal 1 (Week 1): Following improved chest x-rays and medical clearance, pt will particiate in instrumental swallow study to determine least restrictive diet. SLP Short Term Goal 2 (Week 1): Pt will utilize speech intelligibility strategies to increase intelligibility to 60% at the word level with Max A verbal cues. SLP Short Term Goal 3 (Week 1): Pt will initiate use of picture and/or letter communication board to communicate complex messages when he is not understood by staff in 3/5 opportunities with Min A cues. SLP Short Term Goal 4 (Week 1): Pt will demonstrate basic problem solving during functional tasks with Mod A verbal/visual cues.  Skilled Therapeutic Interventions: Pt was seen for skilled ST targeting speech goals. SLP facilitated session with overall Max A models and visual cues to implement overarticulation and pausing between words to increase speech intelligibility. Pt provided names of family member with ~75% intelligibility using said strategies. During a 1-2 word level barrier task, pt's intelligibility decreased to ~50%. Pt continues to require consistent verbal reminders and reference to his written aid speech strategies sheet in order to implement strategies for intelligibility and pt's frustration with communication is evident. Furthermore, he was reluctant to use letter board for spelling message X2 today - suspect due to overall frustration level. Will continue to provide skilled intervention to increase functional communication. Pt left laying in bed with alarm set and all needs within reach. Continue per current plan of care.       Pain Pain Assessment Pain Scale: 0-10 Pain Score: 0-No  pain  Therapy/Group: Individual Therapy  Jon [name] 05/18/2019, 9:05 AM

## 2019-05-18 NOTE — Progress Notes (Addendum)
[facility] PHYSICAL MEDICINE & REHABILITATION PROGRESS NOTE  Subjective/Complaints: Patient seen laying in bed this morning.  Per nursing, he did not sleep well overnight because he was anxious.  He appears relatively calm this morning.  He is able to vocalize better and states that he would like some water.  Educated patient on diet.  ROS:.  Denies CP, shortness of breath, nausea, vomiting, diarrhea.  Objective: Vital Signs: Blood pressure 128/75, pulse 63, temperature 97.6 F (36.4 C), resp. rate 20, height 5\' 11"  (1.803 m), weight 100.9 kg, SpO2 97 %. No results found. Recent Labs    05/17/19 0655 05/18/19 0519  WBC 10.5 11.3*  HGB 9.7* 10.6*  HCT 31.4* 33.7*  PLT 301 318   Recent Labs    05/18/19 0519  NA 137  K 4.6  CL 104  CO2 24  GLUCOSE 118*  BUN 8  CREATININE 0.86  CALCIUM 8.6*    Physical Exam: BP 128/75 (BP Location: Left Arm)   Pulse 63   Temp 97.6 F (36.4 C)   Resp 20   Ht 5\' 11"  (1.803 m)   Wt 100.9 kg   SpO2 97%   BMI 31.02 kg/m  Constitutional: No distress . Vital signs reviewed.  Obese. HENT: Normocephalic.  Atraumatic.  + NG. Eyes: EOMI. No discharge. Cardiovascular: No JVD. Respiratory: Normal effort.  No stridor.  + [place]. GI: Non-distended. Skin: Warm and dry.  Intact. Psych: Normal mood.  Normal behavior. Musc: No edema in extremities.  No tenderness in extremities. Psych: Unable to assess due to dysarthria Neurological: Alert Severe dysarthria, slightly improved Able to follow simple motor commands.  Motor:  RUE: 3/5 proximal distal RLE: Hip flexion, knee extension 4/5, dorsiflexion 4/5  Increased tone right upper extremity  Assessment/Plan: 1. 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  Physiatrist is providing close team supervision and 24 hour management of active medical problems listed  below.  Physiatrist and rehab team continue to assess barriers to discharge/monitor patient progress toward functional and medical goals  Care Tool:  Bathing    Body parts bathed by patient: Right arm, Abdomen, Right upper leg, Left upper leg, Face   Body parts bathed by helper: Left arm, Front perineal area, Buttocks, Left lower leg, Right lower leg     Bathing assist Assist Level: Maximal Assistance - Patient 24 - 49%     Upper Body Dressing/Undressing Upper body dressing   What is the patient wearing?: Pull over shirt    Upper body assist Assist Level: Moderate Assistance - Patient 50 - 74%    Lower Body Dressing/Undressing Lower body dressing      What is the patient wearing?: Incontinence brief     Lower body assist Assist for lower body dressing: Maximal Assistance - Patient 25 - 49%     Toileting Toileting    Toileting assist Assist for toileting: Total Assistance - Patient < 25%     Transfers Chair/bed transfer  Transfers assist  Chair/bed transfer activity did not occur: N/A  Chair/bed transfer assist level: Moderate Assistance - Patient 50 - 74%     Locomotion Ambulation   Ambulation assist   Ambulation activity did not occur: Safety/medical concerns(fatigue, LE weakness, restricted to threapy in room due to IV fluids)  Assist level: 2 helpers Assistive device: Walker-rolling Max distance: 25   Walk 10 feet activity   Assist  Walk 10  feet activity did not occur: Safety/medical concerns(fatigue, LE weakness, restricted to threapy in room due to IV fluids)  Assist level: 2 helpers Assistive device: Walker-rolling   Walk 50 feet activity   Assist Walk 50 feet with 2 turns activity did not occur: Safety/medical concerns(fatigue, LE weakness, restricted to threapy in room due to IV fluids)         Walk 150 feet activity   Assist Walk 150 feet activity did not occur: Safety/medical concerns(fatigue, LE weakness, restricted to threapy  in room due to IV fluids)         Walk 10 feet on uneven surface  activity   Assist Walk 10 feet on uneven surfaces activity did not occur: Safety/medical concerns(fatigue, LE weakness, restricted to threapy in room due to IV fluids)         Wheelchair     Assist Will patient use wheelchair at discharge?: Yes Type of Wheelchair: Manual Wheelchair activity did not occur: Safety/medical concerns(fatigue, R hemi, restricted to threapy in room due to IV fluids)         Wheelchair 50 feet with 2 turns activity    Assist    Wheelchair 50 feet with 2 turns activity did not occur: Safety/medical concerns(fatigue, R hemi, restricted to threapy in room due to IV fluids)       Wheelchair 150 feet activity     Assist Wheelchair 150 feet activity did not occur: Safety/medical concerns(fatigue, R hemi, restricted to threapy in room due to IV fluids)          Medical Problem List and Plan: 1.  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  Continue CIR 2.  Antithrombotics: -DVT/anticoagulation:  Pharmaceutical: Coumadin.   INR 2.7 on 1/11, DC heparin GGT  -antiplatelet therapy: on ASA 3. Pain Management: tylenol prn 4. Mood: LCSW to follow for evaluation and support when appropriate.              -antipsychotic agents: N/A 5. Neuropsych: This patient is not fully capable of making decisions on his own behalf. 6. Skin/Wound Care: Routine pressure relief measures.  7. Fluids/Electrolytes/Nutrition: NPO. Continue tube feeds, added water flushes.  8. Aspiration PNA: On Unasyn day #7/7. Needs to be upright with pulmonary toilet.    Wean supplemental oxygen as tolerated  9. CAF/CHB s/p PPM:   Monitor HR Off amiodarone and Entresto due to issues with hypotension.  11 COPD/OSA: Duonebs and incruse resumed. Encourage IS and keep HOB>30 degrees.    Communicated medication adjustments with respiratory therapy 12. Depression/anxiety disorder: Emotional support 13. H/o migraines: Has been managed on Topamax and gabapentin.  14. Hypokalemia:   Kdur DC'd on 1/11  Potassium 4.6 on 1/11  Continue to monitor 15.  Hyperglycemia-secondary to tube feeds  Relatively controlled on 1/11  Continue to monitor 16.  Leukocytosis   WBCs 11.3 on 1/11  Afebrile  Continue to monitor 17.  Acute blood loss anemia  Hemoglobin 10.6 on 1/11  Continue to monitor 18.  Hypoalbuminemia  Adjust with tube feeds 19.  Combined congestive heart failure  EF 25-30%  Daily weights  No sign of fluid overload at this time, continue to monitor Filed Weights   05/15/19 0406 05/16/19 0500 05/17/19 0423  Weight: 101.9 kg 101.5 kg 100.9 kg   Stable on 1/10 20.  Loose stools  Discussed changing tube feeds with dietary  Improved 21.  Post stroke dysphagia  N.p.o., advance  as tolerated  LOS: 5 days A FACE TO FACE EVALUATION WAS PERFORMED  Quaneshia Wareing Karis Juba 05/18/2019, 8:06 AM

## 2019-05-18 NOTE — Care Management (Signed)
[facility] Individual Statement of Services  Patient Name:  Jon [name]  Date:  05/15/2019  Welcome to the [facility]patient [facility].  Our goal is to provide you with an individualized program based on your diagnosis and situation, designed to meet your specific needs.  With this comprehensive rehabilitation program, you will be expected to participate in at least 3 hours of rehabilitation therapies Monday-Friday, with modified therapy programming on the weekends.  Your rehabilitation program will include the following services:  Physical Therapy (PT), Occupational Therapy (OT), Speech Therapy (ST), 24 hour per day rehabilitation nursing, Therapeutic Recreaction (TR), Neuropsychology, Case Management (Social Worker), Rehabilitation Medicine, Nutrition Services and Pharmacy Services  Weekly team conferences will be held on Tuesdays to discuss your progress.  Your Social Worker will talk with you frequently to get your input and to update you on team discussions.  Team conferences with you and your family in attendance may also be held.  Expected length of stay: 25-28 days   Overall anticipated outcome: minimal assistance  Depending on your progress and recovery, your program may change. Your Social Worker will coordinate services and will keep you informed of any changes. Your Social Worker's name and contact numbers are listed  below.  The following services may also be recommended but are not provided by the Inpatient Rehabilitation Center:   Driving Evaluations  Home Health Rehabiltiation Services  Outpatient Rehabilitation Services   Arrangements will be made to provide these services after discharge if needed.  Arrangements include referral to agencies that provide these services.  Your insurance has been verified to be:  Medicaid Your primary doctor is:  Samuel Germany  Pertinent information will be shared with your doctor and your insurance  company.  Social Worker:  [place], [place] [phone] or [phone]   Information discussed with and copy given to patient by: Jon [name], 05/15/2019, 5:15 PM

## 2019-05-18 NOTE — Progress Notes (Signed)
Physical Therapy Session Note  Patient Details  Name: Jon [name] MRN: [id number] Date of Birth: 1965-08-30  Today's Date: 05/18/2019 PT Individual Time: 1330-1415 PT Individual Time Calculation (min): 45 min   Short Term Goals: Week 1:  PT Short Term Goal 1 (Week 1): Pt will perform bed mobility with min A PT Short Term Goal 2 (Week 1): Pt will transfer stand<>pivot with LRAD mod A PT Short Term Goal 3 (Week 1): Pt will perform car transfer mod A  Skilled Therapeutic Interventions/Progress Updates:   Received pt supine in bed, pt's mother present at bedside. Pt agreeable to therapy and able to communicate that he was not in pain using communication board. Pt required verbal cues to speak one word at a time throughout session for improved communication with therapist. Session focused on functional mobility/transfers, ambulation, LE strength, balance, and improved tolerance to activity. Pt on 4L O2 via [place] throughout session. Pt performed bed mobility with min A and use of bed rails. Pt transferred bed<>WC stand<>pivot with RW mod A. Pt transported to gym in [facility] for time management purposes. Pt ambulated 49ft with RW mod A +2 for equipment. Pt required verbal cues for increased bilateral step length and RW safety. Pt fatigued after ambulating, O2 sat 93% and HR 67bpm. Pt declined ambulating again due to increased fatigue. Pt performed sit<>stands x 5 with RW min A. Pt transported back to room total assist. Pt transferred stand<>pivot WC<>bed mod A, however did not wait for therapist to prepare WC and began transfer before WC breaks locked. Pt educated on importance of locking breaks prior to transferring, however pt did not demonstrate signs of understanding and was too focused on returning to bed. Pt requested his pants off prior to laying in bed. Therapist assisted with doffing pants min A. Concluded session with pt supine in bed, needs within reach, and bed alarm on.    Therapy  Documentation Precautions:  Precautions Precautions: Fall Precaution Comments: R hemi, dysarthria, dysphagia, on 4L O2 via [place], NG tube, and NPO Restrictions Weight Bearing Restrictions: No   Therapy/Group: Individual Therapy Martin Majestic PT, DPT   05/18/2019, 10:44 AM

## 2019-05-18 NOTE — Progress Notes (Signed)
Social Work Assessment and Plan   Patient Details  Name: Jon [name] MRN: [id number] Date of Birth: [birth year]  Today's Date: 05/15/2019  Problem List:  Patient Active Problem List   Diagnosis Date Noted  . Chronic combined systolic and diastolic congestive heart failure (HCC)   . Loose stools   . Acute on chronic combined systolic and diastolic congestive heart failure (HCC)   . Hypoalbuminemia due to protein-calorie malnutrition (HCC)   . Hyperglycemia   . Subtherapeutic international normalized ratio (INR)   . Embolic stroke (HCC) 05/13/2019  . Dysphagia, post-stroke   . Migraine without status migrainosus, not intractable   . Anxiety and depression   . Chronic obstructive pulmonary disease (HCC)   . OSA (obstructive sleep apnea)   . Chronic atrial fibrillation (HCC)   . Hypokalemia   . Essential hypertension 05/12/2019  . Hyperlipidemia 05/12/2019  . Diabetes mellitus type II, controlled (HCC) 05/12/2019  . Dysphagia 05/12/2019  . Aspiration pneumonia (HCC) 05/12/2019  . Leukocytosis 05/12/2019  . Cigarette smoker 05/12/2019  . Obesity 05/12/2019  . Family hx-stroke 05/12/2019  . Acute blood loss anemia 05/12/2019  . Stroke (cerebrum) (HCC) 05/08/2019  . Acute ischemic stroke (HCC) L MCA s/p attempted IR, embolic d/t PAF, low EF and subtherapeutic AC 05/08/2019  . Status post aortic valve replacement 02/23/2019  . Pacemaker [employer] Jude device implanted in summer 2020 02/23/2019  . Erectile dysfunction 12/10/2017  . Nontraumatic multiple localized intracerebral hemorrhages (HCC) 10/16/2017  . Dysarthria 09/10/2017  . VBI (vertebrobasilar insufficiency) 09/10/2017  . Cholelithiasis 12/20/2016  . Current use of long term anticoagulation 12/20/2016  . Late effects of CVA (cerebrovascular accident) 08/08/2015  . Paroxysmal atrial fibrillation (HCC) 08/08/2015  . Presence of prosthetic heart valve 08/08/2015  . 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 Idiopathic peripheral neuropathy 07/14/2013  . Cerebral thrombosis with cerebral infarction (HCC) 07/14/2013  . Generalized constriction of visual field 10/15/2012  . Seroma, postoperative 06/13/2012  . CVA (cerebral infarction) 05/11/2012  . Cerebral infarction (HCC) 05/11/2012  . Stroke, embolic (HCC) 05/07/2012    Class: Acute  . History of CVA (cerebrovascular accident) 05/07/2012  . HCAP (healthcare-associated pneumonia) 05/06/2012  . Syncope 05/06/2012  . Anxiety 05/06/2012  . Aortic stenosis 05/06/2012  . Aortic aneurysm and dissection (HCC) 05/06/2012  . Thoracic aortic aneurysm (HCC) 05/06/2012  . Aortic valve disorder 05/06/2012  . Dissecting aortic aneurysm (HCC) 05/06/2012  . Aneurysm of thoracic aorta (HCC) 05/06/2012   Past Medical History:  Past Medical History:  Diagnosis Date  . Aneurysm of thoracic aorta (HCC) 05/06/2012   IMO routine update IMO routine update  . Anxiety    sees Dr. Renae Fickle  . Aortic aneurysm and dissection (HCC) 05/06/2012  . Aortic stenosis    mechanical AVR 04/22/12  . Aortic valve disorder 05/06/2012  . 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 Bicuspid aortic valve   . Cerebral infarction (HCC) 05/11/2012  . Cerebral thrombosis with cerebral infarction (HCC) 07/14/2013  . Cholelithiasis 12/20/2016  . Current use of long term anticoagulation 12/20/2016  . CVA (cerebral infarction)    occipital CVA 12/13  . Dissecting aortic aneurysm (HCC) 05/06/2012  . Dysarthria 09/10/2017  . Dysrhythmia    sees Dr. Geronimo Running, [facility] cardiology in [place]  . Generalized constriction of visual field 10/15/2012   IMOUPDATE IMOUPDATE  . 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 GERD (gastroesophageal reflux disease)   .  HCAP (healthcare-associated pneumonia) 05/06/2012  . Headache(784.0)    "due to vision problem"  . History of CVA  (cerebrovascular accident) 05/07/2012  . Hypothyroidism   . Idiopathic peripheral neuropathy 07/14/2013  . Late effects of CVA (cerebrovascular accident) 08/08/2015  . Migraine   . Nontraumatic multiple localized intracerebral hemorrhages (HCC) 10/16/2017  . Paroxysmal atrial fibrillation (HCC) 08/08/2015   On warfarin On warfarin  . Presence of prosthetic heart valve 08/08/2015  . Shortness of breath   . Stroke (HCC)    x3  . Stroke, embolic (HCC) 05/07/2012  . Syncope 05/06/2012  . Thoracic aortic aneurysm (HCC)    replacement aortic root 04/22/12  . VBI (vertebrobasilar insufficiency) 09/10/2017   Past Surgical History:  Past Surgical History:  Procedure Laterality Date  . APPLICATION OF WOUND VAC  06/11/2012   Procedure: APPLICATION OF WOUND VAC;  Surgeon: [name], MD;  Location: [facility] OR;  Service: Vascular;  Laterality: Right;  . CARDIAC CATHETERIZATION    . CHOLECYSTECTOMY    . CORONARY ARTERY BYPASS GRAFT  04/22/2012   Procedure: CORONARY ARTERY BYPASS GRAFTING (CABG);  Surgeon: [name], MD;  Location: [facility] OR;  Service: Open Heart Surgery;  Laterality: N/A;  times one with right greater saphenous vein graft  . I & D EXTREMITY  06/11/2012   Procedure: IRRIGATION AND DEBRIDEMENT EXTREMITY;  Surgeon: [name], MD;  Location: [facility] OR;  Service: Vascular;  Laterality: Right;  . INTRAOPERATIVE TRANSESOPHAGEAL ECHOCARDIOGRAM  04/22/2012   Procedure: INTRAOPERATIVE TRANSESOPHAGEAL ECHOCARDIOGRAM;  Surgeon: [name], MD;  Location: [facility] OR;  Service: Open Heart Surgery;  Laterality: N/A;  . IR CT HEAD LTD  05/08/2019  . IR PERCUTANEOUS ART THROMBECTOMY/INFUSION INTRACRANIAL INC DIAG ANGIO  05/08/2019  . IR US GUIDE VASC ACCESS RIGHT  05/08/2019  . PERMANENT PACEMAKER INSERTION  10/2018   [employer] Jude's dual PPM  . RADIOLOGY WITH ANESTHESIA N/A 05/08/2019   Procedure: IR WITH ANESTHESIA;  Surgeon: Radiologist, Medication, MD;  Location: [facility] OR;  Service: Radiology;   Laterality: N/A;  . ROTATOR CUFF REPAIR     12 years ago, Left  . THORACIC AORTIC ANEURYSM REPAIR  04/22/2012   Procedure: THORACIC ASCENDING ANEURYSM REPAIR (AAA);  Surgeon: [name], MD;  Location: [facility] OR;  Service: Open Heart Surgery;  Laterality: N/A;   Social History:  reports that he has been smoking cigarettes. He started smoking about 7 years ago. He has been smoking about 2.50 packs per day. He has never used smokeless tobacco. He reports current alcohol use. He reports that he does not use drugs.  Family / Support Systems Marital Status: Separated How Long?: 9 yrs Patient Roles: Parent, Other (Comment)(son) Children: daughter, Antiono Ettinger @ [phone] Other Supports: mother, Drucilla Chalet @ [phone];  Larence Penning @ [phone] Anticipated Caregiver: per Caryl Ada (main caregiver as she is home 24/7), his brother, and his mom Patsy as needed. Mother, however, states that she will be primary caregiver. Ability/Limitations of Caregiver: Min A Caregiver Availability: 24/7 Family Dynamics: Patient indicates that the primary contact person for him is his mother.  Mother reports that patient's daughters "do not help much at all" and are not expected to be caregiver support.  As noted, patient's brother and sister-in-law are also supportive and can provide short-term assistance.  Social History Preferred language: English Religion: Baptist Cultural Background: N/A Read: Yes Write: Yes Employment Status: Disabled Marine scientist Issues: None Guardian/Conservator: None-per MD, patient not fully capable of making decisions on  his own behalf.  Defer to mother.   Abuse/Neglect Abuse/Neglect Assessment Can Be Completed: Yes Physical Abuse: Denies Verbal Abuse: Denies Sexual Abuse: Denies Exploitation of patient/patient's resources: Denies Self-Neglect: Denies  Emotional Status Pt's affect, behavior and adjustment status: Patient lying  in bed and does make eye contact with me and acknowledges that he understands I am to be his Education officer, museum and will be talking with his mother to gather additional information.  He offers very brief answers but difficult to understand.  Anticipate referral to neuropsychology for additional support once communication improves. Recent Psychosocial Issues: Declining health and chronic health issues Psychiatric History: None Substance Abuse History: Remote h/o drug abuse  Patient / Family Perceptions, Expectations & Goals Pt/Family understanding of illness & functional limitations: Patient and mother with basic understanding that he has suffered another stroke and of his resulting functional limitations/need for return to CIR. Premorbid pt/family roles/activities: Mother notes patient was independent overall, however, is disabled and was no longer driving. Anticipated changes in roles/activities/participation: Mother notes that she will be primary caregiver to patient at discharge. Pt/family expectations/goals: Mother hopeful patient can reach some level of minimal assistance by discharge.  Community [employer] Agencies: None Premorbid Home Care/DME Agencies: Other (Comment)(after prior CIR d/c in 2014, pt attended OP Rehab in [place]) Resource referrals recommended: Neuropsychology  Discharge Planning Living Arrangements: Parent Support Systems: Parent, Other relatives Type of Residence: Private residence Insurance Resources: Medicaid (specify county)([place]) Financial Resources: SSI, SSD Living Expenses: Lives with family Money Management: Family Does the patient have any problems obtaining your medications?: No Home Management: Pt and mother shared responsibilities PTA. Patient/Family Preliminary Plans: Per mother, she and patient will discharge initially to patient's brother's home where more assistance is available. Social Work Anticipated Follow Up Needs: HH/OP Expected  length of stay: 21-25 days  Clinical Impression Unfortunate gentleman here on CIR following another stroke.  Of note, lengthy history of multiple health issues and prior strokes as well as prior stay on CIR in 2014.  Patient legally separated and has been living with his mother for several years.  Relatively independent PTA, however, is on disability and no longer drives.  Support and discharge will be mother brother and sister-in-law.  Will refer for neuropsychology to further assess mood as his communication abilities improved.  Will follow for support and discharge planning needs.  Mylia Pondexter 05/15/2019, 5:13 PM

## 2019-05-18 NOTE — Progress Notes (Signed)
Occupational Therapy Session Note  Patient Details  Name: Jon [name] MRN: [id number] Date of Birth: 03-02-1966  Today's Date: 05/18/2019 OT Individual Time: 1050-1200 OT Individual Time Calculation (min): 70 min    Short Term Goals: Week 1:  OT Short Term Goal 1 (Week 1): Pt will use right UE at diminshed level in ADL tasks with min A OT Short Term Goal 2 (Week 1): Pt will don LB clothing (including brief and footwear) with mod A OT Short Term Goal 3 (Week 1): Pt will perform toileting with mod A sit to stand OT Short Term Goal 4 (Week 1): Pt will transfer to the toilet with min A.  Skilled Therapeutic Interventions/Progress Updates:    Treatment session with focus on functional transfers, sustained attention to task, and awareness of deficits.  Pt received supine in bed agreeable to therapy session.  Completed bed mobility with CGA and stand pivot transfer bed > w/c with mod assist.  Engaged in bathing at sink side with focus on attention to task and awareness when dropping wash cloth and hairbrush.  Pt would continue to engage in motion of washing of under arm and brushing hair even without item in hand.  Pt dropping wash cloth multiple times without any awareness.  Engaged in sit > stand for LB bathing with min-mod assist.  Pt with no clothes initially therefore donned clean hospital gown.  Pt fidgeting with bed sheets and attempting to transfer back to bed despite cues due to impulsivity.  Therefore transferred pt back to bed with min assist and positioned back in supine.  Pt's mother arrived at end of session, therefore donned pants at bed level.  Max cues for communication due to dysarthria, pt with difficulty following cues to state one word at a time.  Pt remained semi-reclined in bed with all needs in reach.  Therapy Documentation Precautions:  Precautions Precautions: Fall Precaution Comments: R hemi, dysarthria, dysphagia, on 4L O2 via [place], NG tube, and  NPO Restrictions Weight Bearing Restrictions: No Pain:  Pt with no c/o pain   Therapy/Group: Individual Therapy  Rosalio Loud 05/18/2019, 3:04 PM

## 2019-05-18 NOTE — Progress Notes (Signed)
Pt has been awake most of the night, seems anxious. Continues on heparin gtt and iv aBT. Large amount of throat secretions, pt has a difficult time getting up despite a quite strong cough, suctioned PRN. HOB remains above 30 degrees. Has been able to make needs known, utilizing call bell appropriately and been continent all night.

## 2019-05-18 NOTE — Progress Notes (Signed)
ANTICOAGULATION CONSULT NOTE  Pharmacy Consult for Heparin Indication: stroke and mechanical valve, atrial fibrillation  Allergies  Allergen Reactions  . Hydrocodone-Acetaminophen Other (See Comments)    PT MOTHER DOES NOT REMEMBER REACTION  Only use in Emergency   . Hydromorphone Itching and Rash       . Other Other (See Comments)    ALL NARCOTICS-ITCHING, RASH   . Oxycodone Other (See Comments)    Unknown reaction  . Penicillins Rash    Did it involve swelling of the face/tongue/throat, SOB, or low BP? Unknown Did it involve sudden or severe rash/hives, skin peeling, or any reaction on the inside of your mouth or nose? Unknown Did you need to seek medical attention at a hospital or doctor's office? Unknown When did it last happen?unknown If all above answers are "NO", may proceed with cephalosporin use.  . Dilaudid [Hydromorphone Hcl] Itching and Rash    Patient Measurements: Total Body Weight: 106 kg Height: 71 inches Heparin Dosing Weight: 97.7kg  Vital Signs: Temp: 97.6 F (36.4 C) (01/11 0310) Temp Source: Oral (01/10 1954) BP: 128/75 (01/11 0310) Pulse Rate: 63 (01/11 0310)  Labs: Recent Labs    05/16/19 0609 05/17/19 0655 05/18/19 0519 05/18/19 0525  HGB 9.7* 9.7* 10.6*  --   HCT 30.7* 31.4* 33.7*  --   PLT 286 301 318  --   LABPROT 24.7* 28.7* 28.4*  --   INR 2.2* 2.7* 2.7*  --   HEPARINUNFRC 0.43 0.55  --  0.43  CREATININE  --   --  0.86  --     Estimated Creatinine Clearance: 120.1 mL/min (by C-G formula based on SCr of 0.86 mg/dL).   Assessment: 54 yr old male presented to the ED with stroke symptoms after recent discharge from [facility]. He is supposed to be on warfarin and Lovenox for history of a mechanical valve.  Patient is s/p thrombectomy and bridging with Heparin to therapeutic INR with warfarin. Pharmacy consulted to dose.   INR this morning remains therapeutic (INR 2.7 << 2.7, goal of 2.5-3.5). Will discontinue the  heparin bridge today. Hgb/Hct uptrending, plts wnl - no bleeding noted at this time.   Goal of Therapy:  Heparin level 0.3-0.5 units/ml INR 2.5-3.5  Monitor platelets by anticoagulation protocol: Yes   Plan:  - D/c Heparin  - Repeat Warfarin 7.5 mg x 1 dose at 1800 today - Daily PT/INR, CBC q72h - Will continue to monitor for any signs/symptoms of bleeding and will follow up with PT/INR in the a.m.    Thank you for allowing pharmacy to be a part of this patient's care.  Georgina Pillion, PharmD, BCPS Clinical Pharmacist Clinical phone for 05/18/2019: [phone] 05/18/2019 7:39 AM   **Pharmacist phone directory can now be found on [email or web address] ([email or web address]).  Listed under [facility].

## 2019-05-19 ENCOUNTER — Inpatient Hospital Stay (HOSPITAL_COMMUNITY): Payer: Medicaid Other | Admitting: Occupational Therapy

## 2019-05-19 ENCOUNTER — Inpatient Hospital Stay (HOSPITAL_COMMUNITY): Payer: Medicaid Other

## 2019-05-19 LAB — CBC
HCT: 35.3 % — ABNORMAL LOW (ref 39.0–52.0)
Hemoglobin: 11.2 g/dL — ABNORMAL LOW (ref 13.0–17.0)
MCH: 28.8 pg (ref 26.0–34.0)
MCHC: 31.7 g/dL (ref 30.0–36.0)
MCV: 90.7 fL (ref 80.0–100.0)
Platelets: 386 10*3/uL (ref 150–400)
RBC: 3.89 MIL/uL — ABNORMAL LOW (ref 4.22–5.81)
RDW: 17.3 % — ABNORMAL HIGH (ref 11.5–15.5)
WBC: 11.7 10*3/uL — ABNORMAL HIGH (ref 4.0–10.5)
nRBC: 0 % (ref 0.0–0.2)

## 2019-05-19 LAB — GLUCOSE, CAPILLARY
Glucose-Capillary: 106 mg/dL — ABNORMAL HIGH (ref 70–99)
Glucose-Capillary: 107 mg/dL — ABNORMAL HIGH (ref 70–99)
Glucose-Capillary: 112 mg/dL — ABNORMAL HIGH (ref 70–99)
Glucose-Capillary: 126 mg/dL — ABNORMAL HIGH (ref 70–99)
Glucose-Capillary: 92 mg/dL (ref 70–99)
Glucose-Capillary: 96 mg/dL (ref 70–99)

## 2019-05-19 LAB — PROTIME-INR
INR: 2.9 — ABNORMAL HIGH (ref 0.8–1.2)
Prothrombin Time: 30.6 seconds — ABNORMAL HIGH (ref 11.4–15.2)

## 2019-05-19 MED ORDER — WARFARIN SODIUM 7.5 MG PO TABS
7.5000 mg | ORAL_TABLET | Freq: Once | ORAL | Status: AC
  Administered 2019-05-19: 7.5 mg via ORAL
  Filled 2019-05-19: qty 1

## 2019-05-19 NOTE — Progress Notes (Signed)
Spoke with Deatra Ina PA regarding B/P no new orders at this time.

## 2019-05-19 NOTE — Progress Notes (Signed)
ANTICOAGULATION CONSULT NOTE  Pharmacy Consult for Warfarin Indication: stroke and mechanical valve, atrial fibrillation  Allergies  Allergen Reactions  . Hydrocodone-Acetaminophen Other (See Comments)    PT MOTHER DOES NOT REMEMBER REACTION  Only use in Emergency   . Hydromorphone Itching and Rash       . Other Other (See Comments)    ALL NARCOTICS-ITCHING, RASH   . Oxycodone Other (See Comments)    Unknown reaction  . Penicillins Rash    Did it involve swelling of the face/tongue/throat, SOB, or low BP? Unknown Did it involve sudden or severe rash/hives, skin peeling, or any reaction on the inside of your mouth or nose? Unknown Did you need to seek medical attention at a hospital or doctor's office? Unknown When did it last happen?unknown If all above answers are "NO", may proceed with cephalosporin use.  . Dilaudid [Hydromorphone Hcl] Itching and Rash    Patient Measurements: Total Body Weight: 106 kg Height: 71 inches Heparin Dosing Weight: 97.7kg  Vital Signs: BP: 113/73 (01/12 0355) Pulse Rate: 64 (01/12 0355)  Labs: Recent Labs    05/17/19 0655 05/18/19 0519 05/18/19 0525 05/19/19 0530  HGB 9.7* 10.6*  --  11.2*  HCT 31.4* 33.7*  --  35.3*  PLT 301 318  --  386  LABPROT 28.7* 28.4*  --  30.6*  INR 2.7* 2.7*  --  2.9*  HEPARINUNFRC 0.55  --  0.43  --   CREATININE  --  0.86  --   --     Estimated Creatinine Clearance: 120.1 mL/min (by C-G formula based on SCr of 0.86 mg/dL).   Assessment: 54 yr old male presented to the ED with stroke symptoms after recent discharge from [facility]. He is supposed to be on warfarin and Lovenox for history of a mechanical valve.  Patient is s/p thrombectomy and bridging with Heparin to therapeutic INR with warfarin. Pharmacy consulted to dose.   INR this morning remains therapeutic (INR 2.9 << 2.7, goal of 2.5-3.5). Hgb/Hct uptrending, plts wnl - no bleeding noted at this time.   Goal of Therapy:  Heparin  level 0.3-0.5 units/ml INR 2.5-3.5  Monitor platelets by anticoagulation protocol: Yes   Plan:  - Repeat Warfarin 7.5 mg x 1 dose at 1800 today - Daily PT/INR, CBC q72h - Will continue to monitor for any signs/symptoms of bleeding and will follow up with PT/INR in the a.m.    Thank you for allowing pharmacy to be a part of this patient's care.  Georgina Pillion, PharmD, BCPS Clinical Pharmacist Clinical phone for 05/19/2019: [phone] 05/19/2019 9:24 AM   **Pharmacist phone directory can now be found on [email or web address] ([email or web address]).  Listed under [facility].

## 2019-05-19 NOTE — Progress Notes (Signed)
Occupational Therapy Session Note  Patient Details  Name: Jon [name] MRN: [id number] Date of Birth: April 24, 1966  Today's Date: 05/19/2019 OT Individual Time: 1100-1200 OT Individual Time Calculation (min): 60 min    Short Term Goals: Week 1:  OT Short Term Goal 1 (Week 1): Pt will use right UE at diminshed level in ADL tasks with min A OT Short Term Goal 2 (Week 1): Pt will don LB clothing (including brief and footwear) with mod A OT Short Term Goal 3 (Week 1): Pt will perform toileting with mod A sit to stand OT Short Term Goal 4 (Week 1): Pt will transfer to the toilet with min A.  Skilled Therapeutic Interventions/Progress Updates:    Treatment session with focus on functional transfers, sit <> stand, standing balance, and RUE NMR.  Pt received supine in bed.  Donned pants with setup assist, pt bridging in bed and requiring increased time to fully adjust pants over hips.  Completed bed mobility with CGA and stand pivot transfer mod assist.  Engaged in table top activity in standing with focus on sit > stand and standing tolerance.  Pt tolerating ~2 mins before needing to sit down.  Attempted to incorporate RUE into pipe tree task, pt able to utilize at gross assist level but unable to truly grasp and sustain grasp on any items.  Attempted stacking larger blocks, pt still unable to sustain grasp to maneuver blocks.  Issued pt blue resistive foam block to focus on grasp strengthening.  Returned to room and transferred back to bed min-mod assist with impulsivity ultimately requiring mod assist for control of transfer.  Pt able to make needs know that he needed to use urinal.  Required assistance with setup and maintaining positioning during void.  Pt remained semi-reclined in bed with all needs in reach.  Therapy Documentation Precautions:  Precautions Precautions: Fall Precaution Comments: R hemi, dysarthria, dysphagia, on 4L O2 via [place], NG tube, and NPO Restrictions Weight Bearing  Restrictions: No Pain:  Pt with no c/o pain   Therapy/Group: Individual Therapy  Rosalio Loud 05/19/2019, 12:10 PM

## 2019-05-19 NOTE — Progress Notes (Signed)
Pt slept better overnight. Still having large amount of upper chest congestion

## 2019-05-19 NOTE — Progress Notes (Signed)
[place] PHYSICAL MEDICINE & REHABILITATION PROGRESS NOTE  Subjective/Complaints: Patient seen laying in bed this morning.  He indicates he slept well overnight.  No reported issues overnight.  ROS: Denies CP, shortness of breath, nausea, vomiting, diarrhea.  Objective: Vital Signs: Blood pressure 113/73, pulse 64, temperature 97.6 F (36.4 C), resp. rate 18, height 5\' 11"  (1.803 m), weight 100.9 kg, SpO2 98 %. No results found. Recent Labs    05/18/19 0519 05/19/19 0530  WBC 11.3* 11.7*  HGB 10.6* 11.2*  HCT 33.7* 35.3*  PLT 318 386   Recent Labs    05/18/19 0519  NA 137  K 4.6  CL 104  CO2 24  GLUCOSE 118*  BUN 8  CREATININE 0.86  CALCIUM 8.6*    Physical Exam: BP 113/73   Pulse 64   Temp 97.6 F (36.4 C)   Resp 18   Ht 5\' 11"  (1.803 m)   Wt 100.9 kg   SpO2 98%   BMI 31.02 kg/m  Constitutional: No distress . Vital signs reviewed.  Obese. HENT: Normocephalic.  Atraumatic.  + NG. Eyes: EOMI. No discharge. Cardiovascular: No JVD. Respiratory: Normal effort.  No stridor.  + Doon. GI: Non-distended. Skin: Warm and dry.  Intact. Psych: Unable to accurately assess due to dysarthria Musc: No edema in extremities.  No tenderness in extremities. Neurological: Alert Severe dysarthria, stable Able to follow simple motor commands.  Motor:  RUE: 3/5 proximal distal RLE: Hip flexion, knee extension 4 +/5, dorsiflexion 4+/5  ?  Increased tone right upper extremity versus patient resistance  Assessment/Plan: 1. 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  Physiatrist is providing close team supervision and 24 hour management of active medical problems listed below.  Physiatrist and rehab team continue to assess barriers to discharge/monitor patient progress toward functional and medical goals  Care Tool:  Bathing    Body parts  bathed by patient: Right arm, Abdomen, Right upper leg, Left upper leg, Face   Body parts bathed by helper: Left arm, Front perineal area, Buttocks, Left lower leg, Right lower leg     Bathing assist Assist Level: Maximal Assistance - Patient 24 - 49%     Upper Body Dressing/Undressing Upper body dressing   What is the patient wearing?: Pull over shirt    Upper body assist Assist Level: Moderate Assistance - Patient 50 - 74%    Lower Body Dressing/Undressing Lower body dressing      What is the patient wearing?: Incontinence brief     Lower body assist Assist for lower body dressing: Maximal Assistance - Patient 25 - 49%     Toileting Toileting    Toileting assist Assist for toileting: Total Assistance - Patient < 25%     Transfers Chair/bed transfer  Transfers assist     Chair/bed transfer assist level: Moderate Assistance - Patient 50 - 74%     Locomotion Ambulation   Ambulation assist   Ambulation activity did not occur: Safety/medical concerns(fatigue, LE weakness, restricted to threapy in room due to IV fluids)  Assist level: 2 helpers Assistive device: Walker-rolling Max distance: 10ft   Walk 10 feet activity   Assist  Walk 10 feet activity did not occur: Safety/medical concerns(fatigue, LE weakness, restricted to threapy in room due to IV fluids)  Assist level: 2 helpers Assistive device: Walker-rolling   Walk 50 feet activity   Assist Walk 50 feet  with 2 turns activity did not occur: Safety/medical concerns(fatigue, LE weakness, restricted to threapy in room due to IV fluids)         Walk 150 feet activity   Assist Walk 150 feet activity did not occur: Safety/medical concerns(fatigue, LE weakness, restricted to threapy in room due to IV fluids)         Walk 10 feet on uneven surface  activity   Assist Walk 10 feet on uneven surfaces activity did not occur: Safety/medical concerns(fatigue, LE weakness, restricted to threapy in  room due to IV fluids)         Wheelchair     Assist Will patient use wheelchair at discharge?: Yes Type of Wheelchair: Manual Wheelchair activity did not occur: Safety/medical concerns(fatigue, R hemi, restricted to threapy in room due to IV fluids)         Wheelchair 50 feet with 2 turns activity    Assist    Wheelchair 50 feet with 2 turns activity did not occur: Safety/medical concerns(fatigue, R hemi, restricted to threapy in room due to IV fluids)       Wheelchair 150 feet activity     Assist Wheelchair 150 feet activity did not occur: Safety/medical concerns(fatigue, R hemi, restricted to threapy in room due to IV fluids)          Medical Problem List and Plan: 1.  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  Continue CIR 2.  Antithrombotics: -DVT/anticoagulation:  Pharmaceutical: Coumadin goal 2.5-3.5.   INR therapeutic on 1/12  -antiplatelet therapy: on ASA 3. Pain Management: tylenol prn 4. Mood: LCSW to follow for evaluation and support when appropriate.              -antipsychotic agents: N/A 5. Neuropsych: This patient is not fully capable of making decisions on his own behalf. 6. Skin/Wound Care: Routine pressure relief measures.  7. Fluids/Electrolytes/Nutrition: NPO. Continue tube feeds, added water flushes.  8. Aspiration PNA: On Unasyn day #7/7. Needs to be upright with pulmonary toilet.    Wean supplemental oxygen as tolerated, continues to require 9. CAF/CHB s/p PPM:   Monitor HR Off amiodarone and Entresto due to issues with hypotension.  11 COPD/OSA: Duonebs and incruse resumed. Encourage IS and keep HOB>30 degrees.   Communicated medication adjustments with respiratory therapy 12. Depression/anxiety disorder: Emotional support 13. H/o migraines: Has been managed on Topamax and gabapentin.  14. Hypokalemia:   Kdur  DC'd on 1/11  Potassium 4.6 on 1/11, will order labs for later this week  Continue to monitor 15.  Hyperglycemia-secondary to tube feeds  Controlled on 1/12  Continue to monitor 16.  Leukocytosis   WBCs 11.7 on 1/12  Afebrile  Continue to monitor 17.  Acute blood loss anemia  Hemoglobin 11.2 on 1/12  Continue to monitor 18.  Hypoalbuminemia  Adjust with tube feeds 19.  Combined congestive heart failure  EF 25-30%  Daily weights  No sign of fluid overload at this time, continue to monitor Filed Weights   05/15/19 0406 05/16/19 0500 05/17/19 0423  Weight: 101.9 kg 101.5 kg 100.9 kg   Stable on 1/10, no weights for today 20.  Loose stools  Discussed changing tube feeds with dietary  Having approximately 2/day.  Will monitor with completion of antibiotics today 21.  Post stroke dysphagia  Continue tube feeds  N.p.o., advance as tolerated  LOS: 6 days A FACE TO FACE EVALUATION WAS PERFORMED  Bilan Tedesco Lorie Phenix 05/19/2019, 8:34 AM

## 2019-05-19 NOTE — Progress Notes (Signed)
Physical Therapy Session Note  Patient Details  Name: Jon [name] MRN: [id number] Date of Birth: [birth year]  Today's Date: 05/19/2019 PT Individual Time: 0800-0900 and 1300-1400 PT Individual Time Calculation (min): 60 min and 60 min   Short Term Goals: Week 1:  PT Short Term Goal 1 (Week 1): Pt will perform bed mobility with min A PT Short Term Goal 2 (Week 1): Pt will transfer stand<>pivot with LRAD mod A PT Short Term Goal 3 (Week 1): Pt will perform car transfer mod A  Skilled Therapeutic Interventions/Progress Updates:   Treatment Session 1: 0800-0900 60 min Received pt supine in bed, pt agreeable to therapy, and able to communicate that he was in no pain. Session focused on functional mobility/transfers, ambulation with RW, LE strength, balance/coordination, and improved activity tolerance. Pt on 4L O2 via [place] throughout session. Pt performed bed mobility with min A. Pt donned pull over shirt sitting EOB with mod A. Pt donned pants in sitting with mod A and pulled pants over hips in standing with mod A. PT donned shoes total assist. Pt reported feeling dizzy. BP 78/68 and HR 131bpm sitting EOB. Pt reported decreased dizziness after seated rest break. Pt transported to gym in [facility] for time management purposes. Pt ambulated 42ft with RW mod A+2. O2 sat 87% and HR 67bpm increasing to 93% with 4 minute seated rest break. Pt demonstrated narrow BOS, decreased stride length, decreased bilateral step length, and decreased trunk rotation. Pt declined ambulating again due to increased fatigue. Pt performed standing RLE toe taps on cone 2x5 with bilateral UE support on RW min A. Pt with increased fatigue after activity requesting to return to bed. Pt transferred stand<>pivot WC<> bed mod A. Concluded session with pt supine in bed, needs within reach, and bed alarm on.   Treatment Session 2: 1300-1400 60 min Received pt supine in bed, pt agreeable to therapy, and able to communicate no pain.  Session focused on functional mobility/transfers, ambulation with RW, simulated car transfer, LE strength, balance/coordination, and improved activity tolerance. Pt on 4L O2 via [place] throughout session. Pt donned pants min A in supine. Pt performed bed mobility with min A. Pt transferred stand<>pivot bed<>WC mod A without AD. PT donned shoes total assist for time management. Pt ambulated 78ft x 1 trial and 75ft x 1 trial with RW mod A +2 for equipment and WC follow. Pt required max verbal cues for increased RLE step length and wider BOS. Therapist provided visual cues using green line of floor to keep one foot on either side of the line; however, pt with poor carry over. O2 sat 91% after ambulation increasing to 94% with education for pursed lip breathing techniques. Pt performed car transfer without AD mod A +2 for equipment. Pt imulsive requiring max verbal cues to wait for therapist to set up transfer and position equipment. Pt transferred stand<>pivot without AD heavy mod A to Nustep and performed bilateral UE and LE strengthening for 5 minutes at workload 4 for total of 286 steps. Pt required tactile cues and readjustment to maintain RUE on handgrip. O2 sat 93% after activity and pt required extensive rest break to recover. Concluded session with pt supine in bed, needs within reach, and bed alarm on.   Therapy Documentation Precautions:  Precautions Precautions: Fall Precaution Comments: R hemi, dysarthria, dysphagia, on 4L O2 via , NG tube, and NPO Restrictions Weight Bearing Restrictions: No  Therapy/Group: Individual Therapy Marlana Salvage Imagene Gurney PT, DPT   05/19/2019,  7:36 AM

## 2019-05-20 ENCOUNTER — Inpatient Hospital Stay (HOSPITAL_COMMUNITY): Payer: Medicaid Other | Admitting: Speech Pathology

## 2019-05-20 ENCOUNTER — Inpatient Hospital Stay (HOSPITAL_COMMUNITY): Payer: Medicaid Other

## 2019-05-20 ENCOUNTER — Other Ambulatory Visit: Payer: Self-pay

## 2019-05-20 ENCOUNTER — Inpatient Hospital Stay (HOSPITAL_COMMUNITY): Payer: Medicaid Other | Admitting: Occupational Therapy

## 2019-05-20 DIAGNOSIS — R7309 Other abnormal glucose: Secondary | ICD-10-CM

## 2019-05-20 LAB — CBC
HCT: 34.1 % — ABNORMAL LOW (ref 39.0–52.0)
Hemoglobin: 11 g/dL — ABNORMAL LOW (ref 13.0–17.0)
MCH: 29 pg (ref 26.0–34.0)
MCHC: 32.3 g/dL (ref 30.0–36.0)
MCV: 90 fL (ref 80.0–100.0)
Platelets: 404 10*3/uL — ABNORMAL HIGH (ref 150–400)
RBC: 3.79 MIL/uL — ABNORMAL LOW (ref 4.22–5.81)
RDW: 17.8 % — ABNORMAL HIGH (ref 11.5–15.5)
WBC: 9 10*3/uL (ref 4.0–10.5)
nRBC: 0 % (ref 0.0–0.2)

## 2019-05-20 LAB — PROTIME-INR
INR: 2.9 — ABNORMAL HIGH (ref 0.8–1.2)
Prothrombin Time: 30.4 seconds — ABNORMAL HIGH (ref 11.4–15.2)

## 2019-05-20 LAB — GLUCOSE, CAPILLARY
Glucose-Capillary: 103 mg/dL — ABNORMAL HIGH (ref 70–99)
Glucose-Capillary: 104 mg/dL — ABNORMAL HIGH (ref 70–99)
Glucose-Capillary: 111 mg/dL — ABNORMAL HIGH (ref 70–99)
Glucose-Capillary: 119 mg/dL — ABNORMAL HIGH (ref 70–99)
Glucose-Capillary: 154 mg/dL — ABNORMAL HIGH (ref 70–99)
Glucose-Capillary: 97 mg/dL (ref 70–99)

## 2019-05-20 MED ORDER — WARFARIN SODIUM 7.5 MG PO TABS
7.5000 mg | ORAL_TABLET | Freq: Once | ORAL | Status: AC
  Administered 2019-05-20: 7.5 mg
  Filled 2019-05-20: qty 1

## 2019-05-20 NOTE — Progress Notes (Signed)
Speech Language Pathology Daily Session Note  Patient Details  Name: Jon [name] MRN: [id number] Date of Birth: [birth year]  Today's Date: 05/20/2019 SLP Individual Time: 0931-1000 SLP Individual Time Calculation (min): 29 min  Short Term Goals: Week 1: SLP Short Term Goal 1 (Week 1): Following improved chest x-rays and medical clearance, pt will particiate in instrumental swallow study to determine least restrictive diet. SLP Short Term Goal 2 (Week 1): Pt will utilize speech intelligibility strategies to increase intelligibility to 60% at the word level with Max A verbal cues. SLP Short Term Goal 3 (Week 1): Pt will initiate use of picture and/or letter communication board to communicate complex messages when he is not understood by staff in 3/5 opportunities with Min A cues. SLP Short Term Goal 4 (Week 1): Pt will demonstrate basic problem solving during functional tasks with Mod A verbal/visual cues.  Skilled Therapeutic Interventions: Pt was seen for skilled ST targeting self care and communication. Upon arrival pt was asleep but easily aroused to medium volume voice and soft touch. Pt initially trying to communicate messages at the sentence level, however copious thick/mucousy secretions further prohibiting intelligibility (in addition to dysarthria). Therefore, SLP provided Mod A physical assist and verbal cues to teach pt how to complete oral care via suction and importance of QID oral care while NPO. Also instructed pt to listen for wet vocal quality and clear throat and use suction to manage secretions, which will also aid in increased intelligibility. SLP further facilitated session with fluctuating Mod-Max a verbal cues to utilize pacing and overarticulation when communicating at the phrase level. Pt able to clearly communicate "my stomach hurts" and rated his pain as 5/10. He also clearly articulated "I'm not having a good day, I don't feel good." In addition, pt stated names of  family members with 90% intelligibility and much better use of overarticulation in comparison to previous session. Estimate pt's overall intelligibility has increased to ~60% at the word or short phrase level based on his performance today. Pt required Min A verbal cues for redirection to conversational tasks; at one point he became distracted by environment, stating "there's water damage on the ceiling." Pt left laying in bed with alarm set and needs within reach.       Pain Pain Assessment Pain Scale: 0-10 Pain Score: 5  Pain Type: Acute pain Pain Location: Abdomen Pain Orientation: Medial Pain Descriptors / Indicators: Discomfort Pain Intervention(s): RN made aware Multiple Pain Sites: No  Therapy/Group: Individual Therapy  [name] 05/20/2019, 10:09 AM

## 2019-05-20 NOTE — Progress Notes (Signed)
Nutrition Follow-up  RD working remotely.  DOCUMENTATION CODES:   Obesity unspecified  INTERVENTION:   Continue tube feeding via Cortrak: - Vital 1.5 @ 75 ml/hr to run over 20 hours (can be held for up to 4 hours for therapies) - Pro-stat 30 ml daily - Free water per MD/PA, currently 100 ml QID  Tube feeding regimen provides 2350 kcal, 116 grams of protein, and 1546 ml of H2O.   NUTRITION DIAGNOSIS:   Inadequate oral intake related to dysphagia as evidenced by NPO status.  Ongoing, being addressed via TF  GOAL:   Patient will meet greater than or equal to 90% of their needs  Met via TF  MONITOR:   Diet advancement, Labs, Weight trends, TF tolerance  REASON FOR ASSESSMENT:   Consult Enteral/tube feeding initiation and management  ASSESSMENT:   54 year old male with PMH of CAF, CHB-s/p PPM, migraines, aortic dissection s/p AVR complicated by CVA with residual dysarthria, gait disorder and visual deficits. Pt presented on 05/08/19 with sudden right-sided weakness and left gaze deviation. CT head showed abnormal hypodensity left colossal body and possible pericallosal gyrus. 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 CTA chest showed diffuse lung opacity concerning for PNA and dissection or aneurysm . He underwent cerebral angiogram with attempted thrombectomy of left A2 occlusion without significant improvement in flow and nonflow limiting dissection of distal cervical segment identified. Neurology felt the stroke was embolic in setting of subtherapeutic INR. Pt was placed on dysphagia 1 diet with honey-thick liquids due to dysphagia on 05/10/19. Chest x-ray performed on 05/12/19 suggestive of aspiration pneumonia. Pt was made NPO and Cortrak placed for nutrition support. Pt admitted  to CIR on 05/13/19.  Pt continues to have loose BMs. KUB today negative.  Weight down a total of 19 lbs since admit to CIR on 01/06. Question accuracy given short timeframe for such drastic weight loss. RD will continue to monitor trends.  Noted plan for repeat MBSS prior to making decision about PEG.  Medications reviewed and include: SSI q 4 hours, protonix, warfarin  Labs reviewed. CBG's: 96-154 x 24 hours  Diet Order:   Diet Order            Diet NPO time specified  Diet effective now              EDUCATION NEEDS:   No education needs have been identified at this time  Skin:  Skin Assessment: Skin Integrity Issues: Incisions: right groin  Last BM:  05/19/19 type 7  Height:   Ht Readings from Last 1 Encounters:  05/16/19 5' 11"  (1.803 m)    Weight:   Wt Readings from Last 1 Encounters:  05/20/19 97.2 kg    Ideal Body Weight:  78.2 kg  BMI:  Body mass index is 29.89 kg/m.  Estimated Nutritional Needs:   Kcal:  6712-4580  Protein:  115-130 grams  Fluid:  >/= 2.0 L    Gaynell Face, MS, RD, LDN Inpatient Clinical Dietitian Pager: [phone] Weekend/After Hours: [phone]

## 2019-05-20 NOTE — Progress Notes (Signed)
ANTICOAGULATION CONSULT NOTE  Pharmacy Consult for Warfarin Indication: stroke and mechanical valve, atrial fibrillation  Allergies  Allergen Reactions  . Hydrocodone-Acetaminophen Other (See Comments)    PT MOTHER DOES NOT REMEMBER REACTION  Only use in Emergency   . Hydromorphone Itching and Rash       . Other Other (See Comments)    ALL NARCOTICS-ITCHING, RASH   . Oxycodone Other (See Comments)    Unknown reaction  . Penicillins Rash    Did it involve swelling of the face/tongue/throat, SOB, or low BP? Unknown Did it involve sudden or severe rash/hives, skin peeling, or any reaction on the inside of your mouth or nose? Unknown Did you need to seek medical attention at a hospital or doctor's office? Unknown When did it last happen?unknown If all above answers are "NO", may proceed with cephalosporin use.  . Dilaudid [Hydromorphone Hcl] Itching and Rash    Patient Measurements: Total Body Weight: 97.2 kg Height: 71 inches  Vital Signs: Temp: 97.2 F (36.2 C) (01/13 0432) BP: 103/70 (01/13 0432) Pulse Rate: 63 (01/13 0432)  Labs: Recent Labs    05/18/19 0519 05/18/19 0525 05/19/19 0530 05/20/19 0524  HGB 10.6*  --  11.2* 11.0*  HCT 33.7*  --  35.3* 34.1*  PLT 318  --  386 404*  LABPROT 28.4*  --  30.6* 30.4*  INR 2.7*  --  2.9* 2.9*  HEPARINUNFRC  --  0.43  --   --   CREATININE 0.86  --   --   --     Estimated Creatinine Clearance: 118.2 mL/min (by C-G formula based on SCr of 0.86 mg/dL).   Assessment: 54 yr old male presented to the ED with stroke symptoms after recent discharge from [facility]. He is supposed to be on warfarin and Lovenox for history of a mechanical valve.  Patient is s/p thrombectomy and s/p bridging with Heparin to therapeutic INR with warfarin. Pharmacy consulted to dose.   INR remains therapeutic (INR 2.9 <2.9 << 2.7, goal of 2.5-3.5).  Hgb stable, platelet count up 404 - no bleeding noted at this time.   Goal of  Therapy:  INR 2.5-3.5  Monitor platelets by anticoagulation protocol: Yes   Plan:    Continue with Warfarin 7.5 mg x 1 again today   Daily PT/INR   CBC weekly on Mondays with weekly bmet   Monitor for any signs/symptoms of bleeding  Thank you for allowing pharmacy to be a part of this patient's care.  Dennie Fetters, [place] Phone: [phone] 05/20/2019 1:57 PM

## 2019-05-20 NOTE — Progress Notes (Signed)
Occupational Therapy Session Note  Patient Details  Name: Jon [name] MRN: [id number] Date of Birth: [birth year]  Today's Date: 05/20/2019 OT Co-Treatment Time: 1300-1315(co-tx with PT 1300-1330) OT Co-Treatment Time Calculation (min): 15 min   Short Term Goals: Week 1:  OT Short Term Goal 1 (Week 1): Pt will use right UE at diminshed level in ADL tasks with min A OT Short Term Goal 2 (Week 1): Pt will don LB clothing (including brief and footwear) with mod A OT Short Term Goal 3 (Week 1): Pt will perform toileting with mod A sit to stand OT Short Term Goal 4 (Week 1): Pt will transfer to the toilet with min A.  Skilled Therapeutic Interventions/Progress Updates:    Co-tx with primary PT with focus on weight shifting during self-care and functional mobility.  This therapist provided additional support and cues for weight shifting during self-care task of donning pants at sit > stand level and facilitating reaching with RUE to address weight shift as needed for ambulation and stairs as well as LB dressing and reaching into cabinets during ADLs and IADLs.  Pt easily frustrated during session with attempts at mobility in [place], removing harness and refusing further activity in [employer].  Pt left with PT for remainder of treatment session.  Therapy Documentation Precautions:  Precautions Precautions: Fall Precaution Comments: R hemi, dysarthria, dysphagia, on 4L O2 via [place], NG tube, and NPO Restrictions Weight Bearing Restrictions: No General: General OT Amount of Missed Time: 30 Minutes Vital Signs: Therapy Vitals Temp: (!) 97.5 F (36.4 C) Pulse Rate: 62 Resp: 20 BP: 120/78 Patient Position (if appropriate): Lying Oxygen Therapy SpO2: 95 % O2 Device: Nasal Cannula Pain:  Pt with no c/o pain   Therapy/Group: Co-Treatment  [name] 05/20/2019, 3:05 PM

## 2019-05-20 NOTE — Progress Notes (Signed)
[facility] PHYSICAL MEDICINE & REHABILITATION PROGRESS NOTE  Subjective/Complaints: Patient seen sitting up, working with therapies this AM.  He states he slept well overnight. Per therapies, pt with abdominal pain. He states he had a BM this AM.   ROS: Denies CP, shortness of breath, nausea, vomiting, diarrhea.  Objective: Vital Signs: Blood pressure 103/70, pulse 63, temperature (!) 97.2 F (36.2 C), resp. rate 16, height 5\' 11"  (1.803 m), weight 97.2 kg, SpO2 93 %. No results found. Recent Labs    05/19/19 0530 05/20/19 0524  WBC 11.7* 9.0  HGB 11.2* 11.0*  HCT 35.3* 34.1*  PLT 386 404*   Recent Labs    05/18/19 0519  NA 137  K 4.6  CL 104  CO2 24  GLUCOSE 118*  BUN 8  CREATININE 0.86  CALCIUM 8.6*    Physical Exam: BP 103/70 (BP Location: Left Arm)   Pulse 63   Temp (!) 97.2 F (36.2 C)   Resp 16   Ht 5\' 11"  (1.803 m)   Wt 97.2 kg   SpO2 93%   BMI 29.89 kg/m  Constitutional: No distress . Vital signs reviewed. Obese. HENT: Normocephalic.  Atraumatic. +NG Eyes: EOMI. No discharge. Cardiovascular: No JVD. Respiratory: Normal effort.  No stridor. +[place] [facility]: Non-distended. Soft. Hypoactive. Skin: Warm and dry.  Intact. Psych: Difficult to assess due to dysarthria Musc: No edema in extremities.  No tenderness in extremities. Neurological: Alert Severe dysarthria, unchanged Able to follow simple motor commands.  Motor:  RUE: 4-/5 proximal distal RLE: Hip flexion, knee extension 4 +/5, dorsiflexion 4+/5   Assessment/Plan: 1. 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  Physiatrist is providing close team supervision and 24 hour management of active medical problems listed below.  Physiatrist and rehab team continue to assess barriers to discharge/monitor patient progress toward functional and medical goals  Care  Tool:  Bathing    Body parts bathed by patient: Right arm, Abdomen, Right upper leg, Left upper leg, Face   Body parts bathed by helper: Left arm, Front perineal area, Buttocks, Left lower leg, Right lower leg     Bathing assist Assist Level: Maximal Assistance - Patient 24 - 49%     Upper Body Dressing/Undressing Upper body dressing   What is the patient wearing?: Pull over shirt    Upper body assist Assist Level: Moderate Assistance - Patient 50 - 74%    Lower Body Dressing/Undressing Lower body dressing      What is the patient wearing?: Pants     Lower body assist Assist for lower body dressing: Moderate Assistance - Patient 50 - 74%     Toileting Toileting    Toileting assist Assist for toileting: Total Assistance - Patient < 25%     Transfers Chair/bed transfer  Transfers assist     Chair/bed transfer assist level: Moderate Assistance - Patient 50 - 74%     Locomotion Ambulation   Ambulation assist   Ambulation activity did not occur: Safety/medical concerns(fatigue, LE weakness, restricted to threapy in room due to IV fluids)  Assist level: 2 helpers Assistive device: Walker-rolling Max distance: 80ft   Walk 10 feet activity   Assist  Walk 10 feet activity did not occur: Safety/medical concerns(fatigue, LE weakness, restricted to threapy in room due to IV fluids)  Assist level: 2 helpers Assistive device: Walker-rolling   Walk 50 feet activity   Assist  Walk 50 feet with 2 turns activity did not occur: Safety/medical concerns(fatigue, LE weakness, restricted to threapy in room due to IV fluids)         Walk 150 feet activity   Assist Walk 150 feet activity did not occur: Safety/medical concerns(fatigue, LE weakness, restricted to threapy in room due to IV fluids)         Walk 10 feet on uneven surface  activity   Assist Walk 10 feet on uneven surfaces activity did not occur: Safety/medical concerns(fatigue, LE weakness,  restricted to threapy in room due to IV fluids)         Wheelchair     Assist Will patient use wheelchair at discharge?: Yes Type of Wheelchair: Manual Wheelchair activity did not occur: Safety/medical concerns(fatigue, R hemi, restricted to threapy in room due to IV fluids)         Wheelchair 50 feet with 2 turns activity    Assist    Wheelchair 50 feet with 2 turns activity did not occur: Safety/medical concerns(fatigue, R hemi, restricted to threapy in room due to IV fluids)       Wheelchair 150 feet activity     Assist Wheelchair 150 feet activity did not occur: Safety/medical concerns(fatigue, R hemi, restricted to threapy in room due to IV fluids)          Medical Problem List and Plan: 1.  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  Continue CIR  Team conference today to discuss current and goals and coordination of care, home and environmental barriers, and discharge planning with nursing, case manager, and therapies.  2.  Antithrombotics: -DVT/anticoagulation:  Pharmaceutical: Coumadin goal 2.5-3.5.   INR therapeutic on 1/13  -antiplatelet therapy: on ASA 3. Pain Management: tylenol prn 4. Mood: LCSW to follow for evaluation and support when appropriate.              -antipsychotic agents: N/A 5. Neuropsych: This patient is not fully capable of making decisions on his own behalf. 6. Skin/Wound Care: Routine pressure relief measures.  7. Fluids/Electrolytes/Nutrition: NPO. Continue tube feeds, added water flushes.  8. Aspiration PNA: Completed 7 day course of Unasyn on 1/12. Needs to be upright with pulmonary toilet.    Wean supplemental oxygen as tolerated, continues to require 9. CAF/CHB s/p PPM:   Monitor HR Off amiodarone and Entresto due to issues with hypotension.  11 COPD/OSA: Duonebs and incruse resumed. Encourage IS and  keep HOB>30 degrees.   Communicated medication adjustments with respiratory therapy 12. Depression/anxiety disorder: Emotional support 13. H/o migraines: Has been managed on Topamax and gabapentin.  14. Hypokalemia:   Kdur DC'd on 1/11  Potassium 4.6 on 1/11, labs ordered for tomorrow  Continue to monitor 15.  Hyperglycemia-secondary to tube feeds  Labile on 1/13  Continue to monitor 16.  Leukocytosis: Resolved  WBCs 9.0 on 1/13  Afebrile  Continue to monitor 17.  Acute blood loss anemia  Hemoglobin 11.0 on 1/13  Continue to monitor 18.  Hypoalbuminemia  Adjust with tube feeds 19.  Combined congestive heart failure  EF 25-30%  Daily weights  No sign of fluid overload at this time, continue to monitor Filed Weights   05/16/19 0500 05/17/19 0423 05/20/19 0450  Weight: 101.5 kg 100.9 kg 97.2 kg   ?Reliability on 1/13 20.  Loose stools  Discussed changing tube feeds with dietary  Having ~2/day.  Will monitor with completion of antibiotics  today  KUB ordered 21.  Post stroke dysphagia  Continue tube feeds  NPO, advance as tolerated  LOS: 7 days A FACE TO FACE EVALUATION WAS PERFORMED  Mandy Fitzwater Lorie Phenix 05/20/2019, 8:45 AM

## 2019-05-20 NOTE — Progress Notes (Signed)
Physical Therapy Session Note  Patient Details  Name: Jon [name] MRN: [id number] Date of Birth: 1966/01/20  Today's Date: 05/20/2019 PT Individual Time: 0800-0900 and 1330-1400  PT Individual Time Calculation (min): 60 min and 30 min PT Co-treatment Time: 1315-1330 and 15 min   Short Term Goals: Week 1:  PT Short Term Goal 1 (Week 1): Pt will perform bed mobility with min A PT Short Term Goal 2 (Week 1): Pt will transfer stand<>pivot with LRAD mod A PT Short Term Goal 3 (Week 1): Pt will perform car transfer mod A  Skilled Therapeutic Interventions/Progress Updates:    Treatment Session 1: 0800-0900 60 min Received pt supine in bed, pt agreeable to therapy, and able to communicate he was experiencing stomach pain, RN and MD made aware. Pt on 4 L O2 via [place] throughout session. Pt continues to demonstrate severe dysarthria and requires max verbal cues to speak one word at a time. Session focused on functional mobility/transfers, LE strength, ambulation, stair navigation, balance/coordination/motor control, and improved endurance with activity. Pt donned shorts supine in bed with min A. Pt performed bed mobility with min A for trunk control with use of bedrails and HOB slightly elevated. Pt transferred stand<>pivot bed<>WC mod A without AD. Pt navigated 4 steps with 2 rails mod A +2 for equipment ascending and descending with a step to pattern. Pt required verbal cues to maintain RLE flat on step. O2 sat 89% increasing to 93% with 3 minute seated rest break. Pt ambulated 84ft with RW mod A +2 for equipment. O2 sat 91% increasing to 93% with 2 minute seated rest break. Pt required visual cues to maintain each LE on either side of the green line on floor to increase BOS. Pt also required max verbal cues for increased R LE step length. Pt performed 3x5 standing mini squats with bilateral UE support on RW min A. Pt required verbal cues for technique. Pt spontaneously transferred to [facility] without  warning mod A. Therapist educated pt on importance of waiting until therapist is ready before transferring. Pt requested to play connect 4 but refused to stand and performed seated connect 4 x 1 trial. Therapist encouraged pt to use RUE to grab and place connect 4 pieces, however pt demonstrated poor carry over. Pt reported urge to use restroom and was transported back to room total assist. Pt performed toilet transfer without AD and grab bars heavy mod A. Pt required mod A for LE clothing management. Pt transferred stand<>pivot WC<>bed mod A without AD and sit<>supine CGA. Pt requested new brief and therapist doffed/donned brief supine in bed mod A. Concluded session with pt supine in bed, needs within reach, and bed alarm on.  Treatment Session 2:  Individual 1330-1400 30 min Co-Treatment with OT 1315-1330 15 min Received pt supine in bed, pt agreeable to therapy, and able to communicate he was in no pain. Pt on 4 L O2 via [place] throughout session. OT present during session for co-treatment. Session focused on functional mobility/transfers, ambulation, LE strength, balance/coordination/motor control, and improved endurance with activity. Pt performed bed mobility with CGA with HOB elevated. Pt donned shorts sitting EOB and transferred to standing to pull pants over hips with heavy CGA. Therapist donned shoes max A. Pt transferred stand<>pivot bed<>WC mod A without AD. Donned Litegait harness in standing without AD mod A +2. Pt stepped up/down from treadmill with mod A +2 for equipment with verbal cues for stepping sequence/technique. Pt performed lateral weight shifting to L and R  and dynamic reaching outside of BOS with RUE. Treadmill with technical difficulties and pt stepped off backwards mod A +2. Pt sat in [facility] while therapist adjusted Litegait treadmill, by the time therapist able to fix technical difficulty pt doffed harness independently and refused to attempt again. Pt ambulated on level floor 78ft x1 and  55ft x 1 with RW mod A +2 for equipment/WC follow; pt requesting to return to room so his mother could see him walk. O2 sat 85% increasing to 94% with 4 minute seated rest break and education for pursed lip breathing techniques. Pt with increased impulsiveness attempting to get back to bed as quickly as possible. Pt transferred WC<>bed stand<>pivot without AD mod A and sit<>supine CGA. Pt doffed pants in supine with supervision. Discussion had with mother regarding discharge planning to her son's (pt's brother's) home with 3 STE with no rails. Pt's mother stated that her daughter in law would be assisting her with Jon [name]'s care. Therapist educated mother on process of family education and discussed home setup, equipment, and assistance necessary for patient. Mother verbalized understanding. Concluded session with pt supine in bed, needs within reach, and bed alarm on.   Therapy Documentation Precautions:  Precautions Precautions: Fall Precaution Comments: R hemi, dysarthria, dysphagia, on 4L O2 via [place], NG tube, and NPO Restrictions Weight Bearing Restrictions: No   Therapy/Group: Individual Therapy and Co-Treatment [place] PT, DPT   05/20/2019, 7:29 AM

## 2019-05-20 NOTE — Patient Care Conference (Signed)
Inpatient RehabilitationTeam Conference and Plan of Care Update Date: 05/20/2019   Time: 11:30 AM    Patient Name: Jon [name]      Medical Record Number: [id number]  Date of Birth: November 05, 1965 Sex: Male         Room/Bed: 4W05C/4W05C-01 Payor Info: Payor: MEDICAID [place] / Plan: MEDICAID [place] ACCESS / Product Type: *No Product type* /    Admit Date/Time:  05/13/2019  5:12 PM  Primary Diagnosis:  Acute ischemic stroke [facility])  Patient Active Problem List   Diagnosis Date Noted  . Labile blood glucose   . Chronic combined systolic and diastolic congestive heart failure ([place])   . Loose stools   . Acute on chronic combined systolic and diastolic congestive heart failure ([place])   . Hypoalbuminemia due to protein-calorie malnutrition ([place])   . Hyperglycemia   . Subtherapeutic international normalized ratio (INR)   . Embolic stroke ([place]) 80/99/8338  . Dysphagia, post-stroke   . Migraine without status migrainosus, not intractable   . Anxiety and depression   . Chronic obstructive pulmonary disease ([place])   . OSA (obstructive sleep apnea)   . Chronic atrial fibrillation ([place])   . Hypokalemia   . Essential hypertension 05/12/2019  . Hyperlipidemia 05/12/2019  . Diabetes mellitus type II, controlled ([place]) 05/12/2019  . Dysphagia 05/12/2019  . Aspiration pneumonia ([place]) 05/12/2019  . Leukocytosis 05/12/2019  . Cigarette smoker 05/12/2019  . Obesity 05/12/2019  . Family hx-stroke 05/12/2019  . Acute blood loss anemia 05/12/2019  . Stroke (cerebrum) ([place]) 05/08/2019  . Acute ischemic stroke ([place]) L MCA s/p attempted IR, embolic d/t PAF, low EF and subtherapeutic AC 05/08/2019  . Status post aortic valve replacement 02/23/2019  . Pacemaker [place] Jude device implanted in summer 2020 02/23/2019  . Erectile dysfunction 12/10/2017  . Nontraumatic multiple localized intracerebral hemorrhages ([employer]) 10/16/2017  . Dysarthria 09/10/2017  . VBI (vertebrobasilar insufficiency) 09/10/2017  .  Cholelithiasis 12/20/2016  . Current use of long term anticoagulation 12/20/2016  . Late effects of CVA (cerebrovascular accident) 08/08/2015  . Paroxysmal atrial fibrillation ([place]) 08/08/2015  . Presence of prosthetic heart valve 08/08/2015  . 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 Idiopathic peripheral neuropathy 07/14/2013  . Cerebral thrombosis with cerebral infarction ([place]) 07/14/2013  . Generalized constriction of visual field 10/15/2012  . Seroma, postoperative 06/13/2012  . CVA (cerebral infarction) 05/11/2012  . Cerebral infarction ([place]) 05/11/2012  . Stroke, embolic ([place]) 25/09/3974  . History of CVA (cerebrovascular accident) 05/07/2012  . HCAP (healthcare-associated pneumonia) 05/06/2012  . Syncope 05/06/2012  . Anxiety 05/06/2012  . Aortic stenosis 05/06/2012  . Aortic aneurysm and dissection ([place]) 05/06/2012  . Thoracic aortic aneurysm ([place]) 05/06/2012  . Aortic valve disorder 05/06/2012  . Dissecting aortic aneurysm ([place]) 05/06/2012  . Aneurysm of thoracic aorta ([place]) 05/06/2012    Expected Discharge Date: Expected Discharge Date: 06/09/19  Team Members Present: Physician leading conference: Dr. Delice Lesch Social Worker Present: Lennart Pall, LCSW Nurse Present: Dorien Chihuahua, RN;Stacey Creig Hines, RN Case Manager: Karene Fry, RN PT Present: Becky Sax, PT OT Present: Simonne Come, OT SLP Present: Jettie Booze, CF-SLP PPS Coordinator present : Ileana Ladd, Burna Mortimer, SLP     Current Status/Progress Goal Weekly Team Focus  Bowel/Bladder   continent of bladder, incontinent of bowel  Fewer episodes of incontinence  Assess bowel and bladder needs q shift   Swallow/Nutrition/ Hydration   NPO, NG tube, awaiting medical clearance to begin  PO trials to ensure particpation then will pursue repeat MBSS  Sup A least restrictive diet  PO trials, MBSS   ADL's   Mod assist  stand pivot transfers due to impulsivity, Min-mod assist bathing and dressing, able to complete LB dresing at bed level with supervision - however mod assist at sit > stand  Supervision  ADL retraining, RUE NMR, standing balance, safety awareness, reinforcement of communication strategies   Mobility   bed mobility min A/CGA, transfers mod A, ambulation 84ft with RW +2 assist  supervision transfers, CGA gait and stairs  communication, functional mobility/transfers, ambulation, car transfers, LE strength, balance/coordination/motor planning, and improved endurance   Communication             Safety/Cognition/ Behavioral Observations            Pain   No C/O pain on this shift  Remains pain free  Assess for pain q 4 hrs and PRN   Skin   Bruising to bil arms  No new skin issues  Assess skin q shift    Rehab Goals Patient on target to meet rehab goals: Yes *See Care Plan and progress notes for long and short-term goals.     Barriers to Discharge  Current Status/Progress Possible Resolutions Date Resolved   Nursing                  PT  Medical stability  Severe dysarthria and impulsive              OT                  SLP                SW                Discharge Planning/Teaching Needs:  Pt to return home with mother as primary caregiver and other family (brother/ sis-in-law) to provide 24/7 assistance.  Mother has attended some tx session.   Team Discussion: Off heparin drip, abx completed yesterday, monitoring labs, loose stools, abd pain, abd xray ordered.  RN BS good, secretions less thick, lungs better, inc bowel, con suction self.  OT to xray, min/mod pivot, min/mod standing balance, dressing S, mod A to dress standing, mom and Dtr are intense.  PT min bed, transfers mod A, amb 42' +2, S transfers goals, CGA amb/stair goals, on O2 4L.  SLP NPO, dysarthria, max cues, impulsive.   Revisions to Treatment Plan: N/A     Medical Summary Current Status: Expressive aphasia,  right hemiparesis, dysarthria, poor safety awareness, limitations in self-care, dysphagia secondary to bilateral CVA, left greater than right Weekly Focus/Goal: Improve mobility, abd pain, swallowing, loose stools, CHF, hypokalemia  Barriers to Discharge: Medical stability;Weight;New oxygen;Nutrition means   Possible Resolutions to Barriers: Therapies, xray abd/chest ordered, follow weights, follow labs   Continued Need for Acute Rehabilitation Level of Care: The patient requires daily medical management by a physician with specialized training in physical medicine and rehabilitation for the following reasons: Direction of a multidisciplinary physical rehabilitation program to maximize functional independence : Yes Medical management of patient stability for increased activity during participation in an intensive rehabilitation regime.: Yes Analysis of laboratory values and/or radiology reports with any subsequent need for medication adjustment and/or medical intervention. : Yes   I attest that I was present, lead the team conference, and concur with the assessment and plan of the team.   Trish Mage 05/20/2019, 8:42 PM  Team conference was held via web/ teleconference due to [place] - 19

## 2019-05-20 NOTE — Progress Notes (Signed)
Occupational Therapy Session Note  Patient Details  Name: Jon [name] MRN: [id number] Date of Birth: Feb 28, 1966  Today's Date: 05/20/2019 OT Individual Time: 1030-1045 OT Individual Time Calculation (min): 15 min  and Today's Date: 05/20/2019 OT Missed Time: 30 Minutes Missed Time Reason:     Short Term Goals: Week 1:  OT Short Term Goal 1 (Week 1): Pt will use right UE at diminshed level in ADL tasks with min A OT Short Term Goal 2 (Week 1): Pt will don LB clothing (including brief and footwear) with mod A OT Short Term Goal 3 (Week 1): Pt will perform toileting with mod A sit to stand OT Short Term Goal 4 (Week 1): Pt will transfer to the toilet with min A.  Skilled Therapeutic Interventions/Progress Updates:    Limited treatment session as transport arrived during therapy session to take him from x-ray.  Pt in bed upon arrival agreeable to therapy session. Pt donned shorts at bed level with setup assist.  Pt completed bridging for BLE strengthening and to pull pants over hips.  Completed bed mobility with CGA to sit at EOB.  Sit > stand with mod assist, preparing to transfer to w/c.  Transport arrived, therefore pt returned to sitting and back to bed for transport to x-ray.    Pt missed 30 mins scheduled OT treatment session.  Will attempt to see later in day.  Therapy Documentation Precautions:  Precautions Precautions: Fall Precaution Comments: R hemi, dysarthria, dysphagia, on 4L O2 via [place], NG tube, and NPO Restrictions Weight Bearing Restrictions: No General: General OT Amount of Missed Time: 30 Minutes Pain: Pain Assessment Pain Scale: 0-10 Pain Score: 5  Pain Type: Acute pain Pain Location: Abdomen Pain Orientation: Medial Pain Descriptors / Indicators: Discomfort Pain Intervention(s): RN made aware Multiple Pain Sites: No   Therapy/Group: Individual Therapy  Rosalio Loud 05/20/2019, 12:13 PM

## 2019-05-21 ENCOUNTER — Inpatient Hospital Stay (HOSPITAL_COMMUNITY): Payer: Medicaid Other | Admitting: Speech Pathology

## 2019-05-21 ENCOUNTER — Inpatient Hospital Stay (HOSPITAL_COMMUNITY): Payer: Medicaid Other

## 2019-05-21 LAB — BASIC METABOLIC PANEL
Anion gap: 6 (ref 5–15)
BUN: 17 mg/dL (ref 6–20)
CO2: 30 mmol/L (ref 22–32)
Calcium: 9 mg/dL (ref 8.9–10.3)
Chloride: 104 mmol/L (ref 98–111)
Creatinine, Ser: 0.95 mg/dL (ref 0.61–1.24)
GFR calc Af Amer: >60 mL/min (ref >60)
GFR calc non Af Amer: >60 mL/min (ref >60)
Glucose, Bld: 136 mg/dL — ABNORMAL HIGH (ref 70–99)
Potassium: 4.3 mmol/L (ref 3.5–5.1)
Sodium: 140 mmol/L (ref 135–145)

## 2019-05-21 LAB — GLUCOSE, CAPILLARY
Glucose-Capillary: 112 mg/dL — ABNORMAL HIGH (ref 70–99)
Glucose-Capillary: 115 mg/dL — ABNORMAL HIGH (ref 70–99)
Glucose-Capillary: 118 mg/dL — ABNORMAL HIGH (ref 70–99)
Glucose-Capillary: 120 mg/dL — ABNORMAL HIGH (ref 70–99)
Glucose-Capillary: 127 mg/dL — ABNORMAL HIGH (ref 70–99)
Glucose-Capillary: 133 mg/dL — ABNORMAL HIGH (ref 70–99)

## 2019-05-21 LAB — PROTIME-INR
INR: 3.1 — ABNORMAL HIGH (ref 0.8–1.2)
Prothrombin Time: 31.7 seconds — ABNORMAL HIGH (ref 11.4–15.2)

## 2019-05-21 MED ORDER — WARFARIN SODIUM 7.5 MG PO TABS
7.5000 mg | ORAL_TABLET | Freq: Once | ORAL | Status: AC
  Administered 2019-05-21: 7.5 mg
  Filled 2019-05-21: qty 1

## 2019-05-21 NOTE — Plan of Care (Signed)
  Problem: Consults Goal: RH STROKE PATIENT EDUCATION Description: See Patient Education module for education specifics  Outcome: Progressing Goal: Nutrition Consult-if indicated Outcome: Progressing Goal: Diabetes Guidelines if Diabetic/Glucose > 140 Description: If diabetic or lab glucose is > 140 mg/dl - Initiate Diabetes/Hyperglycemia Guidelines & Document Interventions  Outcome: Progressing   Problem: RH BOWEL ELIMINATION Goal: RH STG MANAGE BOWEL WITH ASSISTANCE Description: STG Manage Bowel with mod Assistance. Outcome: Progressing Goal: RH STG MANAGE BOWEL W/MEDICATION W/ASSISTANCE Description: STG Manage Bowel with Medication with min Assistance. Outcome: Progressing   Problem: RH BLADDER ELIMINATION Goal: RH STG MANAGE BLADDER WITH ASSISTANCE Description: STG Manage Bladder With mod Assistance Outcome: Progressing Goal: RH STG MANAGE BLADDER WITH MEDICATION WITH ASSISTANCE Description: STG Manage Bladder With Medication With min Assistance. Outcome: Progressing   Problem: RH SKIN INTEGRITY Goal: RH STG SKIN FREE OF INFECTION/BREAKDOWN Description: Skin to remain free from infection and breakdown while rehab with min assist. Outcome: Progressing Goal: RH STG ABLE TO PERFORM INCISION/WOUND CARE W/ASSISTANCE Description: STG Able To Perform Incision/Wound Care With min Assistance. Outcome: Progressing   Problem: RH SAFETY Goal: RH STG ADHERE TO SAFETY PRECAUTIONS W/ASSISTANCE/DEVICE Description: STG Adhere to Safety Precautions With mod Assistance and appropriate assistive Device. Outcome: Progressing   Problem: RH PAIN MANAGEMENT Goal: RH STG PAIN MANAGED AT OR BELOW PT'S PAIN GOAL Description: <3 on a 0-10 pain scale Outcome: Progressing   Problem: RH KNOWLEDGE DEFICIT Goal: RH STG INCREASE KNOWLEDGE OF DIABETES Description: Patient and caregiver will increase knowledge on diabetic medications, dietary restrictions, and follow-up care with the MD post discharge  with min assist from staff. Outcome: Progressing Goal: RH STG INCREASE KNOWLEDGE OF HYPERTENSION Description: Patient and caregiver will increase knowledge on HTN medications, dietary restrictions, and follow-up care with the MD post discharge with min assist from staff.  Outcome: Progressing Goal: RH STG INCREASE KNOWLEDGE OF DYSPHAGIA/FLUID INTAKE Description: Patient and caregiver will increase knowledge of safe swallowing techniques to prevent aspiration pneumonia; increase knowledge of dysphagia diets, and thickened liquids with min assist from staff.  Outcome: Progressing Goal: RH STG INCREASE KNOWLEGDE OF HYPERLIPIDEMIA Description: Patient and caregiver will increase knowledge on HLD medications, dietary restrictions, and follow-up care with the MD post discharge with min assist from staff.  Outcome: Progressing Goal: RH STG INCREASE KNOWLEDGE OF STROKE PROPHYLAXIS Description: Patient and caregiver will increase knowledge on Stroke prevention medications, dietary restrictions, and follow-up care with the MD post discharge with min assist from staff.  Outcome: Progressing   Problem: RH Vision Goal: RH LTG Vision (Specify) Outcome: Progressing   

## 2019-05-21 NOTE — Progress Notes (Signed)
Physical Therapy Weekly Progress Note  Patient Details  Name: Jon [name] MRN: [id number] Date of Birth: 1965-12-31  Beginning of progress report period: May 14, 2019 End of progress report period: May 21, 2019  Today's Date: 05/21/2019 PT Individual Time: 0800-0900 PT Individual Time Calculation (min): 60 min   Patient has met 2 of 3 short term goals. Pt demonstrates improvements in functional mobility/transfers, dynamic standing balance, ambulation, and LE strength. Pt currently requires min A for bed mobility, mod A for transfers without RW, mod A +2 for equipment to ambulate 89f with RW, and mod A +2 for equipment to navigate 4 steps with 2 rails. Pt continues to demonstrate severe dysarthria and requires max verbal cues to clear throat and speak one word at a time; however pt demonstrates poor carry over. Pt with increased impulsivity with transfers despite being educated almost every session on importance of waiting on therapist. Pt's motivation to participate in therapy fluctuates and pt is particular on which activities he will perform.   Patient continues to demonstrate the following deficits muscle weakness, impaired timing and sequencing, decreased coordination and decreased motor planning and decreased standing balance, decreased postural control, hemiplegia and decreased balance strategies and therefore will continue to benefit from skilled PT intervention to increase functional independence with mobility.  Patient progressing toward long term goals.. Continue plan of care.  PT Short Term Goals Week 1:  PT Short Term Goal 1 (Week 1): Pt will perform bed mobility with min A PT Short Term Goal 1 - Progress (Week 1): Met PT Short Term Goal 2 (Week 1): Pt will transfer stand<>pivot with LRAD mod A PT Short Term Goal 2 - Progress (Week 1): Met PT Short Term Goal 3 (Week 1): Pt will perform car transfer mod A PT Short Term Goal 3 - Progress (Week 1): Progressing toward  goal Week 2:  PT Short Term Goal 1 (Week 2): Pt will perform bed mobility with CGA PT Short Term Goal 2 (Week 2): Pt will perform stand<>pivot transfer with LRAD min A PT Short Term Goal 3 (Week 2): Pt will perform car transfer with LRAD mod A  Skilled Therapeutic Interventions/Progress Updates:  Ambulation/gait training;Discharge planning;Functional mobility training;Therapeutic Activities;Balance/vestibular training;Disease management/prevention;Neuromuscular re-education;Therapeutic Exercise;Wheelchair propulsion/positioning;Cognitive remediation/compensation;DME/adaptive equipment instruction;Pain management;Splinting/orthotics;UE/LE Strength taining/ROM;Community reintegration;Functional electrical stimulation;Patient/family education;Stair training;UE/LE Coordination activities   Today's Interventions: Received pt supine in bed, pt agreeable to therapy, and able to communicate he was not in pain. Pt continues to demonstrate severe dysarthria and requires max verbal cues to clear throat and speak one word at a time; however pt with poor carry over. Pt on 4L O2 via [place] throughout session. Session focused on functional mobility/transfers, ambulation, LE strength, R UE/LE NMR, balance/coordination/motor planning, and improved endurance with activity. Pt insisted on removing incontinence brief and donned underwear and pants min A. Pt performed bed mobility with min A/CGA and transferred sit<>stand without AD min A to pull pants over hips. PT donned shoes max A. Pt transferred stand<>pivot bed<>WC without AD mod A. Pt transported to gym for time management purposes. Pt ambulated 954fx 2 trials with RW mod A +2 for WC follow/equipment. Pt continues to require max verbal cues for increased R LE step length, RW safety, upright posture, and wider BOS. O2 sat 89% increasing to 94% with 4 minute seated rest break. In standing without RW pt threw horseshoes with RUE with mod A for balance x 2 trials. Pt with  increased difficulty letting go of horseshoes and demonstrating  increased frustration. Pt stood and threw horseshoes with RUE using RW min A x 1 trial. Pt required multiple rest breaks throughout session and was constantly asking for water. Therapist explained NPO precautions, however pt continued to ask. Pt performed standing alternating heel taps in 3in step with bilateral UE support on RW min A 2x10. Pt reported urge to use restroom and returned to room. Pt transferred stand<>pivot to bedside commode and continent of urine. Pt spontaneously transferred stand<>pivot from bedside commode<>bed with underwear and pants still around feet. Therapist provided mod A to complete transfer and heavily educated pt again on importance of waiting for therapist to transfer. Concluded session with pt supine in bed, needs within reach, and bed alarm on.   Therapy Documentation Precautions:  Precautions Precautions: Fall Precaution Comments: R hemi, dysarthria, dysphagia, on 4L O2 via [place], NG tube, and NPO Restrictions Weight Bearing Restrictions: No   Therapy/Group: Individual Therapy Alfonse Alpers PT, DPT   05/21/2019, 7:36 AM

## 2019-05-21 NOTE — Progress Notes (Signed)
[name] PHYSICAL MEDICINE & REHABILITATION PROGRESS NOTE  Subjective/Complaints: Patient tries his best to communicate but has very difficult time due to dysarthria and expressive aphasia.  Yesterday he was having abdominal and chest pain and he is no longer experiencing this today. Denies pain or constipation and nods yes that he is sleeping well.   ROS: Denies CP, shortness of breath, nausea, vomiting, diarrhea.  Objective: Vital Signs: Blood pressure 112/68, pulse (!) 58, temperature 98.2 F (36.8 C), resp. rate 18, height 5\' 11"  (1.803 m), weight 96.9 kg, SpO2 95 %. DG Chest 2 View  Result Date: 05/20/2019 CLINICAL DATA:  Aspiration pneumonia. EXAM: CHEST - 2 VIEW COMPARISON:  May 12, 2019. FINDINGS: Stable cardiomediastinal silhouette. Status post cardiac valve repair. Feeding tube is seen entering stomach. Left-sided pacemaker is unchanged. No pneumothorax or significant pleural effusion is noted. Minimal bibasilar subsegmental atelectasis or infiltrates are noted. Bony thorax is unremarkable. IMPRESSION: Minimal bibasilar subsegmental atelectasis or infiltrates are noted. Electronically Signed   By: Marijo Conception M.D.   On: 05/20/2019 12:46   DG Abd 1 View  Result Date: 05/20/2019 CLINICAL DATA:  Abdominal pain, recent stroke EXAM: ABDOMEN - 1 VIEW COMPARISON:  10/27/2018 FINDINGS: Tip of feeding tube projects over pylorus/duodenal bulb. Normal bowel gas pattern. No bowel dilatation or bowel wall thickening. Osseous structures demineralized. Surgical clips RIGHT upper quadrant likely reflect cholecystectomy. No urinary tract calcification. IMPRESSION: No acute abnormalities. Electronically Signed   By: Lavonia Dana M.D.   On: 05/20/2019 11:13   Recent Labs    05/19/19 0530 05/20/19 0524  WBC 11.7* 9.0  HGB 11.2* 11.0*  HCT 35.3* 34.1*  PLT 386 404*   Recent Labs    05/21/19 0510  NA 140  K 4.3  CL 104  CO2 30  GLUCOSE 136*  BUN 17  CREATININE 0.95  CALCIUM 9.0     Physical Exam: BP 112/68   Pulse (!) 58   Temp 98.2 F (36.8 C)   Resp 18   Ht 5\' 11"  (1.803 m)   Wt 96.9 kg   SpO2 95%   BMI 29.79 kg/m  Constitutional: No distress . Vital signs reviewed. Obese. HENT: Normocephalic.  Atraumatic. +NG Eyes: EOMI. No discharge. Cardiovascular: No JVD. Respiratory: Normal effort.  No stridor. +[place] GI: Non-distended. Soft. Hypoactive. Skin: Warm and dry.  Intact. Psych: Difficult to assess due to dysarthria Musc: No edema in extremities.  No tenderness in extremities. Neurological: Alert Severe dysarthria, unchanged Able to follow simple motor commands.  Motor:  RUE: 4-/5 proximal distal RLE: Hip flexion, knee extension 4 +/5, dorsiflexion 4+/5   Assessment/Plan: 1. 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  Physiatrist is providing close team supervision and 24 hour management of active medical problems listed below.  Physiatrist and rehab team continue to assess barriers to discharge/monitor patient progress toward functional and medical goals  Care Tool:  Bathing    Body parts bathed by patient: Right arm, Abdomen, Right upper leg, Left upper leg, Face   Body parts bathed by helper: Left arm, Front perineal area, Buttocks, Left lower leg, Right lower leg     Bathing assist Assist Level: Maximal Assistance - Patient 24 - 49%     Upper Body Dressing/Undressing Upper body dressing   What is the patient wearing?: Pull over shirt    Upper body assist Assist Level: Moderate Assistance - Patient  50 - 74%    Lower Body Dressing/Undressing Lower body dressing      What is the patient wearing?: Pants     Lower body assist Assist for lower body dressing: Moderate Assistance - Patient 50 - 74%     Toileting Toileting    Toileting assist Assist for toileting: Moderate Assistance - Patient 50 -  74%     Transfers Chair/bed transfer  Transfers assist     Chair/bed transfer assist level: Moderate Assistance - Patient 50 - 74%     Locomotion Ambulation   Ambulation assist   Ambulation activity did not occur: Safety/medical concerns(fatigue, LE weakness, restricted to threapy in room due to IV fluids)  Assist level: 2 helpers Assistive device: Walker-rolling Max distance: 33ft   Walk 10 feet activity   Assist  Walk 10 feet activity did not occur: Safety/medical concerns(fatigue, LE weakness, restricted to threapy in room due to IV fluids)  Assist level: 2 helpers Assistive device: Walker-rolling   Walk 50 feet activity   Assist Walk 50 feet with 2 turns activity did not occur: Safety/medical concerns(fatigue, LE weakness, restricted to threapy in room due to IV fluids)         Walk 150 feet activity   Assist Walk 150 feet activity did not occur: Safety/medical concerns(fatigue, LE weakness, restricted to threapy in room due to IV fluids)         Walk 10 feet on uneven surface  activity   Assist Walk 10 feet on uneven surfaces activity did not occur: Safety/medical concerns(fatigue, LE weakness, restricted to threapy in room due to IV fluids)         Wheelchair     Assist Will patient use wheelchair at discharge?: Yes Type of Wheelchair: Manual Wheelchair activity did not occur: Safety/medical concerns(fatigue, R hemi, restricted to threapy in room due to IV fluids)         Wheelchair 50 feet with 2 turns activity    Assist    Wheelchair 50 feet with 2 turns activity did not occur: Safety/medical concerns(fatigue, R hemi, restricted to threapy in room due to IV fluids)       Wheelchair 150 feet activity     Assist Wheelchair 150 feet activity did not occur: Safety/medical concerns(fatigue, R hemi, restricted to threapy in room due to IV fluids)          Medical Problem List and Plan: 1.  Expressive aphasia, right  hemiparesis, dysarthria, poor safety awareness, limitations in self-care, dysphagia secondary to bilateral CVA, left greater than right.  Continue CIR 2.  Antithrombotics: -DVT/anticoagulation:  Pharmaceutical: Coumadin goal 2.5-3.5.   INR therapeutic on 1/13, 1/14  -antiplatelet therapy: on ASA 3. Pain Management: tylenol prn 4. Mood: LCSW to follow for evaluation and support when appropriate.              -antipsychotic agents: N/A 5. Neuropsych: This patient is not fully capable of making decisions on his own behalf. 6. Skin/Wound Care: Routine pressure relief measures.  7. Fluids/Electrolytes/Nutrition: NPO. Continue tube feeds, added water flushes.  8. Aspiration PNA: Completed 7 day course of Unasyn on 1/12. Needs to be upright with pulmonary toilet.    Wean supplemental oxygen as tolerated, continues to require 9. CAF/CHB s/p PPM:   Monitor HR Off amiodarone and Entresto due to issues with hypotension.  11 COPD/OSA: Duonebs and incruse resumed. Encourage IS and keep HOB>30 degrees.   Communicated medication adjustments with respiratory therapy 12. Depression/anxiety disorder: Emotional support 13. H/o  migraines: Has been managed on Topamax and gabapentin.  14. Hypokalemia:   Kdur DC'd on 1/11  Potassium 4.6 on 1/11, labs ordered for tomorrow  1/14: stable.   Continue to monitor 15.  Hyperglycemia-secondary to tube feeds  Labile on 1/13  1/14 stable  Continue to monitor 16.  Leukocytosis: Resolved  WBCs 9.0 on 1/13  Afebrile  Continue to monitor 17.  Acute blood loss anemia  Hemoglobin 11.0 on 1/13  Continue to monitor 18.  Hypoalbuminemia  Adjust with tube feeds 19.  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  20.  Loose stools  Discussed changing tube feeds with  dietary  Having ~2/day.  Will monitor with completion of antibiotics today  KUB ordered 21.  Post stroke dysphagia  Continue tube feeds  NPO, advance as tolerated  LOS: 8 days A FACE TO FACE EVALUATION WAS PERFORMED  Densil Ottey P Lindsy Cerullo 05/21/2019, 10:01 AM

## 2019-05-21 NOTE — Progress Notes (Signed)
Occupational Therapy Session Note  Patient Details  Name: Jon [name] MRN: [id number] Date of Birth: [birth year]  Today's Date: 05/21/2019 OT Individual Time: 7628-3151 OT Individual Time Calculation (min): 73 min    Short Term Goals: Week 1:  OT Short Term Goal 1 (Week 1): Pt will use right UE at diminshed level in ADL tasks with min A OT Short Term Goal 2 (Week 1): Pt will don LB clothing (including brief and footwear) with mod A OT Short Term Goal 3 (Week 1): Pt will perform toileting with mod A sit to stand OT Short Term Goal 4 (Week 1): Pt will transfer to the toilet with min A.  Skilled Therapeutic Interventions/Progress Updates:    1:1. Pt with no report of pain. Pt requires significant amount of time to articulate, pause between words and VC for identified speech strategies during session. Pt completes UB dressing in bed HOB elevated with RN present and MIN A to pull down back. Pt completes LB dressing seated EOB with HOH A to incorporate RUE functionally into ADL. Pt grasps blocks reaching crossing midline with RUE to place blocks L initially on level surface, crosses midline reaching R to shoulder height and turns blocks over. Pt requires MAX VC for tip to tip pinch instruction and overemphasize pinch. Exited session with Pt seated in bed, exit alarm on an call light in reach  Therapy Documentation Precautions:  Precautions Precautions: Fall Precaution Comments: R hemi, dysarthria, dysphagia, on 4L O2 via [place], NG tube, and NPO Restrictions Weight Bearing Restrictions: No General:   Vital Signs: Therapy Vitals Pulse Rate: (!) 58 Resp: 18 Patient Position (if appropriate): Lying Oxygen Therapy SpO2: 95 % O2 Device: Nasal Cannula O2 Flow Rate (L/min): 4 L/min FiO2 (%): 36 % Pain: Pain Assessment Pain Scale: 0-10 ADL: ADL Eating: NPO Grooming: Maximal assistance Upper Body Bathing: Moderate assistance Where Assessed-Upper Body Bathing: Wheelchair, Sitting at  sink Lower Body Bathing: Maximal assistance Where Assessed-Lower Body Bathing: Wheelchair, Sitting at sink Upper Body Dressing: Moderate assistance Where Assessed-Upper Body Dressing: Wheelchair Lower Body Dressing: Maximal assistance Where Assessed-Lower Body Dressing: Wheelchair, Sitting at sink Toileting: Maximal assistance Where Assessed-Toileting: Teacher, adult education: Moderate assistance Toilet Transfer Method: Stand pivot Toilet Transfer Equipment: Acupuncturist: Not assessed Vision   Perception    Praxis   Exercises:   Other Treatments:     Therapy/Group: Individual Therapy  Shon Hale 05/21/2019, 10:46 AM

## 2019-05-21 NOTE — Progress Notes (Signed)
Pt slept well throughout night without issue 

## 2019-05-21 NOTE — Progress Notes (Signed)
ANTICOAGULATION CONSULT NOTE  Pharmacy Consult for Warfarin Indication: stroke and mechanical valve, atrial fibrillation  Allergies  Allergen Reactions  . Hydrocodone-Acetaminophen Other (See Comments)    PT MOTHER DOES NOT REMEMBER REACTION  Only use in Emergency   . Hydromorphone Itching and Rash       . Other Other (See Comments)    ALL NARCOTICS-ITCHING, RASH   . Oxycodone Other (See Comments)    Unknown reaction  . Penicillins Rash    Did it involve swelling of the face/tongue/throat, SOB, or low BP? Unknown Did it involve sudden or severe rash/hives, skin peeling, or any reaction on the inside of your mouth or nose? Unknown Did you need to seek medical attention at a hospital or doctor's office? Unknown When did it last happen?unknown If all above answers are "NO", may proceed with cephalosporin use.  . Dilaudid [Hydromorphone Hcl] Itching and Rash    Patient Measurements: Total Body Weight: 97.2 kg Height: 71 inches  Vital Signs: Temp: 98.2 F (36.8 C) (01/14 0310) BP: 112/68 (01/14 0310) Pulse Rate: 58 (01/14 0731)  Labs: Recent Labs    05/19/19 0530 05/20/19 0524 05/21/19 0510  HGB 11.2* 11.0*  --   HCT 35.3* 34.1*  --   PLT 386 404*  --   LABPROT 30.6* 30.4* 31.7*  INR 2.9* 2.9* 3.1*  CREATININE  --   --  0.95    Estimated Creatinine Clearance: 106.7 mL/min (by C-G formula based on SCr of 0.95 mg/dL).   Assessment: 54 yr old male presented to the ED with stroke symptoms after recent discharge from [facility]. He is supposed to be on warfarin and Lovenox for history of a mechanical valve.  Patient is s/p thrombectomy and s/p bridging with Heparin to therapeutic INR with warfarin. Pharmacy consulted to dose.   INR remains therapeutic (INR 3.1 < 2.9 <2.9 << 2.7, goal of 2.5-3.5).  Hgb stable, platelet count up 404 - no bleeding noted at this time.   Goal of Therapy:  INR 2.5-3.5  Monitor platelets by anticoagulation protocol: Yes    Plan:    Continue with Warfarin 7.5 mg x 1 again today   Daily PT/INR   CBC weekly on Mondays with weekly bmet   Monitor for any signs/symptoms of bleeding  Thank you for allowing pharmacy to be a part of this patient's care.  Estella Husk, PharmD, BCPS, BCIDP Clinical Pharmacist Please check AMION for all [facility] numbers 05/21/2019, 9:14 AM

## 2019-05-21 NOTE — Progress Notes (Signed)
Speech Language Pathology Weekly Progress and Session Note  Patient Details  Name: Eryn Krejci MRN: [id number] Date of Birth: 08/11/1965  Beginning of progress report period: May 21, 2019 End of progress report period: May 21, 2019  Today's Date: 05/21/2019 SLP Individual Time: 1300-1400 SLP Individual Time Calculation (min): 60 min  Short Term Goals: Week 1: SLP Short Term Goal 1 (Week 1): Following improved chest x-rays and medical clearance, pt will particiate in instrumental swallow study to determine least restrictive diet. SLP Short Term Goal 1 - Progress (Week 1): Met SLP Short Term Goal 2 (Week 1): Pt will utilize speech intelligibility strategies to increase intelligibility to 60% at the word level with Max A verbal cues. SLP Short Term Goal 2 - Progress (Week 1): Met SLP Short Term Goal 3 (Week 1): Pt will initiate use of picture and/or letter communication board to communicate complex messages when he is not understood by staff in 3/5 opportunities with Min A cues. SLP Short Term Goal 3 - Progress (Week 1): Met SLP Short Term Goal 4 (Week 1): Pt will demonstrate basic problem solving during functional tasks with Mod A verbal/visual cues. SLP Short Term Goal 4 - Progress (Week 1): Met SLP Short Term Goal 6 (Week 1): Pt will participate in MBSS for instrumental assessment of oropharyngeal swallow function.    New Short Term Goals: Week 2: SLP Short Term Goal 1 (Week 2): Pt will consume least restrictive diet with minimal overt s/sx aspiration and efficient mastication and oral clearance with Min A verbal cues for use of swallow strategies. SLP Short Term Goal 2 (Week 2): Pt will demonstrate basic problem solving during functional tasks with Min A verbal/visual cues. SLP Short Term Goal 3 (Week 2): Pt will utilize speech intelligibility strategies (specifically pausing between words and overarticulation) to increase intelligibility to 60% at the word level with Mod A  verbal cues.  Weekly Progress Updates: Pt has made functional gains and met 4 out of 4 short term goals. Pt is currently Max A for communication due to severe dysarthria impacting speech intelligibility. He requires Min-Mod A for basic cognitive tasks due to problem solving and executive function impairments.  Pt is currently NPO with Cortrak for temporary means of nutrition, but was just medically cleared by MD to begin PO trials and repeat MBSS. MBSS for instrumental assessment or oropharyngeal swallow function scheduled for 05/22/19. Pt has demonstrated improved speech intelligibility at the word level and basic problem solving, as well as ability to participate in PO trials. Pt education is ongoing, no family has been present during ST sessions. Pt would continue to benefit from skilled ST while inpatient in order to maximize functional independence and reduce burden of care prior to discharge. Anticipate that pt will need 24/7 supervision at discharge in addition to [place] follow up at next level of care.     Intensity: Minumum of 1-2 x/day, 30 to 90 minutes Frequency: 3 to 5 out of 7 days Duration/Length of Stay: 06/09/19 Treatment/Interventions: Cognitive remediation/compensation;Internal/external aids;Speech/Language facilitation;Cueing hierarchy;Dysphagia/aspiration precaution training;Functional tasks;Patient/family education;Multimodal communication approach   Daily Session  Skilled Therapeutic Interventions: Pt was seen for skilled ST targeting dysphagia and cognitive goals. With medical clearance to begin PO trials and completion of thorough oral care via suction, SLP provided ice chips X3 and pt demonstrated 1 immediate cough. Pt with strong reflexive cough in 1 out of 2 sips of thin H2O from the cup. Pt also consumed puree X1 with no overt s/sx aspiration and  suspected prolonged oral transit but complete oral clearance. Recommend pursue MBSS tomorrow to fully assess oropharyngeal swallow  function to determine least restrictive diet. SLP further facilitated session with Mod A verbal/visual cues for problem solving during a basic money management task. Pt could display set amounts of change with ease, however instruction for organization, problem solving, and extra time required to add and total change. Of note, pt still required Max A verbal cues to repeat unintelligible utterances using pacing and overarticulation for more efficient communication. He did spontaneously utilize communication board to communicate 2 separate messages without prompting. Pt left laying in bed with alarm set and needs within reach. Continue per current plan of care.       Pain Pain Assessment Pain Scale: 0-10 Pain Score: 0-No pain  Therapy/Group: Individual Therapy  Arbutus Leas 05/21/2019, 3:34 PM

## 2019-05-22 ENCOUNTER — Inpatient Hospital Stay (HOSPITAL_COMMUNITY): Payer: Medicaid Other | Admitting: Occupational Therapy

## 2019-05-22 ENCOUNTER — Inpatient Hospital Stay (HOSPITAL_COMMUNITY): Payer: Medicaid Other

## 2019-05-22 ENCOUNTER — Inpatient Hospital Stay (HOSPITAL_COMMUNITY): Payer: Medicaid Other | Admitting: Physical Therapy

## 2019-05-22 LAB — GLUCOSE, CAPILLARY
Glucose-Capillary: 100 mg/dL — ABNORMAL HIGH (ref 70–99)
Glucose-Capillary: 104 mg/dL — ABNORMAL HIGH (ref 70–99)
Glucose-Capillary: 113 mg/dL — ABNORMAL HIGH (ref 70–99)
Glucose-Capillary: 131 mg/dL — ABNORMAL HIGH (ref 70–99)
Glucose-Capillary: 137 mg/dL — ABNORMAL HIGH (ref 70–99)

## 2019-05-22 LAB — PROTIME-INR
INR: 2.9 — ABNORMAL HIGH (ref 0.8–1.2)
Prothrombin Time: 30.4 seconds — ABNORMAL HIGH (ref 11.4–15.2)

## 2019-05-22 MED ORDER — WARFARIN SODIUM 7.5 MG PO TABS
7.5000 mg | ORAL_TABLET | Freq: Once | ORAL | Status: AC
  Administered 2019-05-22: 7.5 mg via ORAL
  Filled 2019-05-22: qty 1

## 2019-05-22 NOTE — Progress Notes (Signed)
Occupational Therapy Weekly Progress Note  Patient Details  Name: Jon [name] MRN: [id number] Date of Birth: 1965-11-01  Beginning of progress report period: May 14, 2019 End of progress report period: May 22, 2019  Today's Date: 05/22/2019 OT Individual Time: 8333-8329 OT Individual Time Calculation (min): 73 min    Patient has met 3 of 4 short term goals.  Pt is making steady progress towards goals. Pt continues to require up to mod assist for transfers due to impulsivity and decreased safety awareness.  Pt has demonstrated ability to complete clothing management at sit > stand level with mod assist for standing balance, continues to require increased assistance when donning shoes.  Pt attempts to incorporate RUE into functional tasks, is able to use as gross assist but continues to demonstrate decreased grasp and manipulation.  Pt continues to require max cues for speech intelligibility due to severe dysarthria and impulsivity and decreased awareness impacting his ability to follow speech intelligibility strategies.    Patient continues to demonstrate the following deficits: muscle weakness, decreased cardiorespiratoy endurance and decreased oxygen support, impaired timing and sequencing and unbalanced muscle activation, decreased visual acuity, decreased attention, decreased awareness, decreased safety awareness and decreased memory and decreased sitting balance, decreased standing balance, hemiplegia and decreased balance strategies and therefore will continue to benefit from skilled OT intervention to enhance overall performance with BADL and Reduce care partner burden.  Patient progressing toward long term goals..  Continue plan of care.  OT Short Term Goals Week 1:  OT Short Term Goal 1 (Week 1): Pt will use right UE at diminshed level in ADL tasks with min A OT Short Term Goal 1 - Progress (Week 1): Met OT Short Term Goal 2 (Week 1): Pt will don LB clothing (including  brief and footwear) with mod A OT Short Term Goal 2 - Progress (Week 1): Met OT Short Term Goal 3 (Week 1): Pt will perform toileting with mod A sit to stand OT Short Term Goal 3 - Progress (Week 1): Met OT Short Term Goal 4 (Week 1): Pt will transfer to the toilet with min A. OT Short Term Goal 4 - Progress (Week 1): Progressing toward goal Week 2:  OT Short Term Goal 1 (Week 2): Pt will transfer to the toilet with min A. OT Short Term Goal 2 (Week 2): Pt will don shoes with Mod assist OT Short Term Goal 3 (Week 2): Pt will complete bathing with min assist at sit > stand level  Skilled Therapeutic Interventions/Progress Updates:    Treatment session with focus on functional transfers, dynamic standing balance, and RUE NMR.  Pt received having just gotten on [facility] with nursing staff.  Pt attempted BM, with no success, but did void bladder. Pt completed hygiene with lateral leans, dropping cloth with Rt hand due to decreased sensation and attention.  Pt able to complete sit > stand from [facility] with Min assist, requiring mod assist for standing balance and to advance pants over Rt hip.  Pt impulsively returned to bed due to fatigue.  After brief rest, pt agreeable to table top activity from EOB.  Engaged in [place] during peg board activity with focus on grasp/release of hand requiring frequent cues to visually attend to hand during opening of hand.  Engaged in dynamic standing activity with focusing on weight shift and alternating toe taps with focus on motor control and attention to task. Pt initially asking to sit in recliner but when told he would need to  have seat belt alarm for safety, pt chose to remain in bed.  Therapy Documentation Precautions:  Precautions Precautions: Fall Precaution Comments: R hemi, dysarthria, dysphagia, on 4L O2 via [place], NG tube, and NPO Restrictions Weight Bearing Restrictions: No General:   Vital Signs: Therapy Vitals Temp: 98.1 F (36.7 C) Pulse Rate: 62 Resp:  16 BP: 102/67 Patient Position (if appropriate): Sitting Oxygen Therapy SpO2: 96 % O2 Device: Nasal Cannula O2 Flow Rate (L/min): 4 L/min Pain:   ADL: ADL Eating: NPO Grooming: Maximal assistance Upper Body Bathing: Moderate assistance Where Assessed-Upper Body Bathing: Wheelchair, Sitting at sink Lower Body Bathing: Maximal assistance Where Assessed-Lower Body Bathing: Wheelchair, Sitting at sink Upper Body Dressing: Moderate assistance Where Assessed-Upper Body Dressing: Wheelchair Lower Body Dressing: Maximal assistance Where Assessed-Lower Body Dressing: Wheelchair, Sitting at sink Toileting: Maximal assistance Where Assessed-Toileting: Glass blower/designer: Moderate assistance Toilet Transfer Method: Stand pivot Toilet Transfer Equipment: Energy manager: Not assessed Vision   Perception    Praxis   Exercises:   Other Treatments:     Therapy/Group: Individual Therapy  Simonne Come 05/22/2019, 7:56 AM

## 2019-05-22 NOTE — Progress Notes (Signed)
Modified Barium Swallow Progress Note  Patient Details  Name: Jon [name] MRN: [id number] Date of Birth: 10/23/1965  Today's Date: 05/22/2019  Modified Barium Swallow completed.  Full report located under Chart Review in the Imaging Section.  Brief recommendations include the following:  Clinical Impression  Pt with severe oral phase dysphagia that results in continued pharyngeal deficits in timing of swallow initiation. Pt demonstrates right anterior spillage, lingual pumping and decreased bolus cohesion across puree, honey thick liquids, nectar thick liquids and thin liquids.  As a result, pt has moderate oral residue post-swallow with all consistencies assessed. Oral phase deficits also result in premature spillage of all liquid boluses, penetration and inconsistent aspiration during swallow. Aspiration is not always sensed. Chin tuck was helpful in increasing time for swallow initiation however pt had moderate amounts of oral residue that he aspirated post swallow. Suspect this is reason that pt had recent dx of aspiration pneumonia. In an effort to reduce oral residue (after the swallow) thin liquids via tsp were assessed with good airway protection established with chin tuck and decreased oral residue post swallow. However, pt not able to consistently condition to recieving thin liquid bolus and performing chin tuck during this study. If pt doesn't uitlize chin tuck he had sensed aspiration of thin liquids. Pt continues at high risk of aspiration given severity of oral phase dysphagia. At this time, conservative diet would be dysphagia 1 with pudding thick liquids, medicine crushed in puree. Recommend thin liquids via tsp be administered at bedside by SLP to target use of chin tuck and facilitate swiftness of oral transit and pharyngeal phase.   Swallow Evaluation Recommendations       SLP Diet Recommendations: Dysphagia 1 (Puree) solids;Honey thick liquids   Liquid Administration  via: Spoon   Medication Administration: Via alternative means   Supervision: Full supervision/cueing for compensatory strategies   Compensations: Slow rate;Small sips/bites   Postural Changes: Seated upright at 90 degrees   Oral Care Recommendations: Oral care BID   Other Recommendations: Order thickener from pharmacy;Prohibited food (jello, ice cream, thin soups);Remove water pitcher;Have oral suction available    Palmer Shorey 05/22/2019,11:56 AM

## 2019-05-22 NOTE — Progress Notes (Signed)
Physical Therapy Session Note  Patient Details  Name: Jon [name] MRN: [id number] Date of Birth: [birth year]  Today's Date: 05/22/2019 PT Individual Time: 5374-8270 PT Individual Time Calculation (min): 45 min   Short Term Goals: Week 2:  PT Short Term Goal 1 (Week 2): Pt will perform bed mobility with CGA PT Short Term Goal 2 (Week 2): Pt will perform stand<>pivot transfer with LRAD min A PT Short Term Goal 3 (Week 2): Pt will perform car transfer with LRAD mod A  Skilled Therapeutic Interventions/Progress Updates: Patient presents supine semi-reclined in bed and agreeable to PT rx, although soiled.  Assisted NT w/ rolling bilateral sides for cleaning and new brief.  Patient required min A/CGA for sup to sit w/ frequent cueing for safety and speed.  Patient sat EOB and min A to don shorts w/ retropulsion noted.  Patient stood w/ mod to min A and verbal cues for hand placement and safet to pull up pants.  Patient transferred SPT bed > w/c w/ mod to min A, for safety and impulsiveness.  Patient performed multiple sit <> stand transfers from w/c w/ min A to occasional mod A and verbal and visual cues for hand placement to improve independence.  Patient standing at table reaching to stack cones w/ bilateral Ues, emphasis on pinch grip for right hand and visual scanning of activity to let go w/ right hand before using left hand.  Patient reaching outside of BOS and crossing midline to challenge balance.  Patient returned to bed semi-relined and all needs available.  Bed alarm on.     Therapy Documentation Precautions:  Precautions Precautions: Fall Precaution Comments: R hemi, dysarthria, dysphagia, on 4L O2 via [place], NG tube, and NPO Restrictions Weight Bearing Restrictions: No General:   Vital Signs: Therapy Vitals Pulse Rate: 62 Resp: 16 Patient Position (if appropriate): Sitting Oxygen Therapy SpO2: 96 % O2 Device: Nasal Cannula O2 Flow Rate (L/min): 4 L/min Pain:  Patient  reports 0/10 pain.      Therapy/Group: Individual Therapy  Lucio Edward 05/22/2019, 9:00 AM

## 2019-05-22 NOTE — Progress Notes (Signed)
Physical Therapy Session Note  Patient Details  Name: Jon [name] MRN: [id number] Date of Birth: [birth year]  Today's Date: 05/22/2019 PT Individual Time: 7124-5809 PT Individual Time Calculation (min): 59 min   Short Term Goals: Week 1:  PT Short Term Goal 1 (Week 1): Pt will perform bed mobility with min A PT Short Term Goal 1 - Progress (Week 1): Met PT Short Term Goal 2 (Week 1): Pt will transfer stand<>pivot with LRAD mod A PT Short Term Goal 2 - Progress (Week 1): Met PT Short Term Goal 3 (Week 1): Pt will perform car transfer mod A PT Short Term Goal 3 - Progress (Week 1): Progressing toward goal Week 2:  PT Short Term Goal 1 (Week 2): Pt will perform bed mobility with CGA PT Short Term Goal 2 (Week 2): Pt will perform stand<>pivot transfer with LRAD min A PT Short Term Goal 3 (Week 2): Pt will perform car transfer with LRAD mod A  Skilled Therapeutic Interventions/Progress Updates:   Received pt supine in bed, pt agreeable to therapy, and able to communicate he was in no pain during session. Pt on 4 L O2 via [place] throughout session decreasing to 3L after ambulation. Session focused on functional mobility/transfers, ambulation, LE strength, balance/coordination/motor planning, and improved endurance with activity. Pt requested new brief at start of session and donned new incontinence brief with mod A in bed. Pt donned shorts in supine with min A. Pt performed bed mobility with min A with HOB elevated and use of bedrails. Therapist donned shoes for time management purposes. Pt transferred stand<>pivot without AD mod A. Pt ambulated 5f with RW min A +2 for equipment and WC follow. O2 sat 94% on 4L. Therapist decreased O2 to 3L for remainder of session. Pt continues to require verbal cues for increased R LE step length, wider BOS, bilateral LE knee extension, RW safety, and upright posture. Pt performed standing alternating toe taps to cones 3x10 with bilateral UE support on RW min A  and vrebal cues for technique. O2 sat 89% increasing to 93% on 3L O2. [facility]<>pivot WC<>bed without AD mod A with poor eccentric control upon descent. Concluded session with pt supine in bed, needs within reach, and bed alarm on. Pt left on 4L O2 via [place].   Therapy Documentation Precautions:  Precautions Precautions: Fall Precaution Comments: R hemi, dysarthria, dysphagia, on 4L O2 via [place], NG tube, and NPO Restrictions Weight Bearing Restrictions: No   Therapy/Group: Individual Therapy AAlfonse AlpersPT, DPT   05/22/2019, 7:43 AM

## 2019-05-22 NOTE — Progress Notes (Addendum)
[place] PHYSICAL MEDICINE & REHABILITATION PROGRESS NOTE  Subjective/Complaints: Patient seen sitting up in bed this AM.  No reported issues overnight.  Repeatedly instructed patient to decrease rate of speech to improve intelligibility, however, patient rate unchanged.   ROS: Denies CP, shortness of breath, nausea, vomiting, diarrhea.  Objective: Vital Signs: Blood pressure 102/67, pulse 62, temperature 98.1 F (36.7 C), resp. rate 16, height 5\' 11"  (1.803 m), weight 96.3 kg, SpO2 96 %. DG Chest 2 View  Result Date: 05/20/2019 CLINICAL DATA:  Aspiration pneumonia. EXAM: CHEST - 2 VIEW COMPARISON:  May 12, 2019. FINDINGS: Stable cardiomediastinal silhouette. Status post cardiac valve repair. Feeding tube is seen entering stomach. Left-sided pacemaker is unchanged. No pneumothorax or significant pleural effusion is noted. Minimal bibasilar subsegmental atelectasis or infiltrates are noted. Bony thorax is unremarkable. IMPRESSION: Minimal bibasilar subsegmental atelectasis or infiltrates are noted. Electronically Signed   By: May 14, 2019 M.D.   On: 05/20/2019 12:46   DG Abd 1 View  Result Date: 05/20/2019 CLINICAL DATA:  Abdominal pain, recent stroke EXAM: ABDOMEN - 1 VIEW COMPARISON:  10/27/2018 FINDINGS: Tip of feeding tube projects over pylorus/duodenal bulb. Normal bowel gas pattern. No bowel dilatation or bowel wall thickening. Osseous structures demineralized. Surgical clips RIGHT upper quadrant likely reflect cholecystectomy. No urinary tract calcification. IMPRESSION: No acute abnormalities. Electronically Signed   By: 10/29/2018 M.D.   On: 05/20/2019 11:13   Recent Labs    05/20/19 0524  WBC 9.0  HGB 11.0*  HCT 34.1*  PLT 404*   Recent Labs    05/21/19 0510  NA 140  K 4.3  CL 104  CO2 30  GLUCOSE 136*  BUN 17  CREATININE 0.95  CALCIUM 9.0    Physical Exam: BP 102/67   Pulse 62   Temp 98.1 F (36.7 C)   Resp 16   Ht 5\' 11"  (1.803 m)   Wt 96.3 kg    SpO2 96%   BMI 29.61 kg/m  Constitutional: No distress . Vital signs reviewed. Obese.  HENT: Normocephalic.  Atraumatic. +NG. Eyes: EOMI. No discharge. Cardiovascular: No JVD.  RRR. Respiratory: Normal effort.  No stridor. +[place].  Bilaterally clear to auscultation. GI: Non-distended.  Skin: Warm and dry.  Intact. Psych: Difficult to assess due to dysarthria Musc: No edema in extremities.  No tenderness in extremities. Neurological: Alert Severe dysarthria, stable Able to follow simple motor commands.  Motor:  RUE: 4-4+/5 proximal distal RLE: Hip flexion, knee extension 4 +/5, dorsiflexion 4+/5  Assessment/Plan: 1. 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  Physiatrist is providing close team supervision and 24 hour management of active medical problems listed below.  Physiatrist and rehab team continue to assess barriers to discharge/monitor patient progress toward functional and medical goals  Care Tool:  Bathing    Body parts bathed by patient: Right arm, Abdomen, Right upper leg, Left upper leg, Face   Body parts bathed by helper: Left arm, Front perineal area, Buttocks, Left lower leg, Right lower leg     Bathing assist Assist Level: Maximal Assistance - Patient 24 - 49%     Upper Body Dressing/Undressing Upper body dressing   What is the patient wearing?: Pull over shirt    Upper body assist Assist Level: Moderate Assistance - Patient 50 - 74%    Lower Body Dressing/Undressing Lower body dressing      What is  the patient wearing?: Underwear/pull up, Pants     Lower body assist Assist for lower body dressing: Minimal Assistance - Patient > 75%     Toileting Toileting    Toileting assist Assist for toileting: Minimal Assistance - Patient > 75%     Transfers Chair/bed transfer  Transfers assist     Chair/bed transfer  assist level: Moderate Assistance - Patient 50 - 74%     Locomotion Ambulation   Ambulation assist   Ambulation activity did not occur: Safety/medical concerns(fatigue, LE weakness, restricted to threapy in room due to IV fluids)  Assist level: 2 helpers Assistive device: Walker-rolling Max distance: 1ft   Walk 10 feet activity   Assist  Walk 10 feet activity did not occur: Safety/medical concerns(fatigue, LE weakness, restricted to threapy in room due to IV fluids)  Assist level: 2 helpers Assistive device: Walker-rolling   Walk 50 feet activity   Assist Walk 50 feet with 2 turns activity did not occur: Safety/medical concerns(fatigue, LE weakness, restricted to threapy in room due to IV fluids)  Assist level: 2 helpers Assistive device: Walker-rolling    Walk 150 feet activity   Assist Walk 150 feet activity did not occur: Safety/medical concerns(fatigue, LE weakness, restricted to threapy in room due to IV fluids)         Walk 10 feet on uneven surface  activity   Assist Walk 10 feet on uneven surfaces activity did not occur: Safety/medical concerns(fatigue, LE weakness, restricted to threapy in room due to IV fluids)         Wheelchair     Assist Will patient use wheelchair at discharge?: Yes Type of Wheelchair: Manual Wheelchair activity did not occur: Safety/medical concerns(fatigue, R hemi, restricted to threapy in room due to IV fluids)         Wheelchair 50 feet with 2 turns activity    Assist    Wheelchair 50 feet with 2 turns activity did not occur: Safety/medical concerns(fatigue, R hemi, restricted to threapy in room due to IV fluids)       Wheelchair 150 feet activity     Assist Wheelchair 150 feet activity did not occur: Safety/medical concerns(fatigue, R hemi, restricted to threapy in room due to IV fluids)          Medical Problem List and Plan: 1.  Expressive aphasia, right hemiparesis, dysarthria, poor  safety awareness, limitations in self-care, dysphagia secondary to bilateral CVA, left greater than right.  Continue CIR 2.  Antithrombotics: -DVT/anticoagulation:  Pharmaceutical: Coumadin goal 2.5-3.5.   INR therapeutic on 1/15  -antiplatelet therapy: on ASA 3. Pain Management: tylenol prn 4. Mood: LCSW to follow for evaluation and support when appropriate.              -antipsychotic agents: N/A 5. Neuropsych: This patient is not fully capable of making decisions on his own behalf. 6. Skin/Wound Care: Routine pressure relief measures.  7. Fluids/Electrolytes/Nutrition: NPO. Continue tube feeds, added water flushes.  8. Aspiration PNA: Completed 7 day course of Unasyn on 1/12. Needs to be upright with pulmonary toilet.    Wean supplemental oxygen as tolerated, continues to require 9. CAF/CHB s/p PPM:   Monitor HR Off amiodarone and Entresto due to issues with hypotension.  11 COPD/OSA: Duonebs and incruse resumed. Encourage IS and keep HOB>30 degrees.   Communicated medication adjustments with respiratory therapy 12. Depression/anxiety disorder: Emotional support 13. H/o migraines: Has been managed on Topamax and gabapentin.  14. Hypokalemia:   Kdur DC'd on 1/11  Potassium 4.3 on 1/14, labs ordered for Monday  Continue to monitor 15.  Hyperglycemia-secondary to tube feeds  Slightly labile on 1/15  Continue to monitor 16.  Leukocytosis: Resolved  WBCs 9.0 on 1/13  Afebrile  Continue to monitor 17.  Acute blood loss anemia  Hemoglobin 11.0 on 1/13, labs ordered for Monday  Continue to monitor 18.  Hypoalbuminemia  Adjust with tube feeds 19.  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  Loose stools  Discussed changing tube feeds with dietary  Having ~2/day.  Will monitor with completion  of antibiotics today  KUB reviewed, unremarkable 21.  Post stroke dysphagia  Continue tube feeds  NPO, advance as tolerated, plan for MBS today  LOS: 9 days A FACE TO FACE EVALUATION WAS PERFORMED  Kayleen Alig Lorie Phenix 05/22/2019, 8:46 AM

## 2019-05-22 NOTE — Plan of Care (Signed)
  Problem: Consults Goal: RH STROKE PATIENT EDUCATION Description: See Patient Education module for education specifics  Outcome: Progressing Goal: Nutrition Consult-if indicated Outcome: Progressing Goal: Diabetes Guidelines if Diabetic/Glucose > 140 Description: If diabetic or lab glucose is > 140 mg/dl - Initiate Diabetes/Hyperglycemia Guidelines & Document Interventions  Outcome: Progressing   Problem: RH BOWEL ELIMINATION Goal: RH STG MANAGE BOWEL WITH ASSISTANCE Description: STG Manage Bowel with mod Assistance. Outcome: Progressing Goal: RH STG MANAGE BOWEL W/MEDICATION W/ASSISTANCE Description: STG Manage Bowel with Medication with min Assistance. Outcome: Progressing   Problem: RH BLADDER ELIMINATION Goal: RH STG MANAGE BLADDER WITH ASSISTANCE Description: STG Manage Bladder With mod Assistance Outcome: Progressing Goal: RH STG MANAGE BLADDER WITH MEDICATION WITH ASSISTANCE Description: STG Manage Bladder With Medication With min Assistance. Outcome: Progressing   Problem: RH SKIN INTEGRITY Goal: RH STG SKIN FREE OF INFECTION/BREAKDOWN Description: Skin to remain free from infection and breakdown while rehab with min assist. Outcome: Progressing Goal: RH STG ABLE TO PERFORM INCISION/WOUND CARE W/ASSISTANCE Description: STG Able To Perform Incision/Wound Care With min Assistance. Outcome: Progressing   Problem: RH SAFETY Goal: RH STG ADHERE TO SAFETY PRECAUTIONS W/ASSISTANCE/DEVICE Description: STG Adhere to Safety Precautions With mod Assistance and appropriate assistive Device. Outcome: Progressing   Problem: RH PAIN MANAGEMENT Goal: RH STG PAIN MANAGED AT OR BELOW PT'S PAIN GOAL Description: <3 on a 0-10 pain scale Outcome: Progressing   Problem: RH KNOWLEDGE DEFICIT Goal: RH STG INCREASE KNOWLEDGE OF DIABETES Description: Patient and caregiver will increase knowledge on diabetic medications, dietary restrictions, and follow-up care with the MD post discharge  with min assist from staff. Outcome: Progressing Goal: RH STG INCREASE KNOWLEDGE OF HYPERTENSION Description: Patient and caregiver will increase knowledge on HTN medications, dietary restrictions, and follow-up care with the MD post discharge with min assist from staff.  Outcome: Progressing Goal: RH STG INCREASE KNOWLEDGE OF DYSPHAGIA/FLUID INTAKE Description: Patient and caregiver will increase knowledge of safe swallowing techniques to prevent aspiration pneumonia; increase knowledge of dysphagia diets, and thickened liquids with min assist from staff.  Outcome: Progressing Goal: RH STG INCREASE KNOWLEGDE OF HYPERLIPIDEMIA Description: Patient and caregiver will increase knowledge on HLD medications, dietary restrictions, and follow-up care with the MD post discharge with min assist from staff.  Outcome: Progressing Goal: RH STG INCREASE KNOWLEDGE OF STROKE PROPHYLAXIS Description: Patient and caregiver will increase knowledge on Stroke prevention medications, dietary restrictions, and follow-up care with the MD post discharge with min assist from staff.  Outcome: Progressing   Problem: RH Vision Goal: RH LTG Vision (Specify) Outcome: Progressing   

## 2019-05-22 NOTE — Progress Notes (Signed)
ANTICOAGULATION CONSULT NOTE  Pharmacy Consult for Warfarin Indication: stroke and mechanical valve, atrial fibrillation  Allergies  Allergen Reactions  . Hydrocodone-Acetaminophen Other (See Comments)    PT MOTHER DOES NOT REMEMBER REACTION  Only use in Emergency   . Hydromorphone Itching and Rash       . Other Other (See Comments)    ALL NARCOTICS-ITCHING, RASH   . Oxycodone Other (See Comments)    Unknown reaction  . Penicillins Rash    Did it involve swelling of the face/tongue/throat, SOB, or low BP? Unknown Did it involve sudden or severe rash/hives, skin peeling, or any reaction on the inside of your mouth or nose? Unknown Did you need to seek medical attention at a hospital or doctor's office? Unknown When did it last happen?unknown If all above answers are "NO", may proceed with cephalosporin use.  . Dilaudid [Hydromorphone Hcl] Itching and Rash    Patient Measurements: Total Body Weight: 97.2 kg Height: 71 inches  Vital Signs: Temp: 98.1 F (36.7 C) (01/15 0426) BP: 102/67 (01/15 0426) Pulse Rate: 62 (01/15 0745)  Labs: Recent Labs    05/20/19 0524 05/21/19 0510 05/22/19 0455  HGB 11.0*  --   --   HCT 34.1*  --   --   PLT 404*  --   --   LABPROT 30.4* 31.7* 30.4*  INR 2.9* 3.1* 2.9*  CREATININE  --  0.95  --     Estimated Creatinine Clearance: 106.5 mL/min (by C-G formula based on SCr of 0.95 mg/dL).   Assessment: 54 yr old male presented to the ED with stroke symptoms after recent discharge from [facility]. He is supposed to be on warfarin and Lovenox for history of a mechanical valve.  Patient is s/p thrombectomy and s/p bridging with Heparin to therapeutic INR with warfarin. Pharmacy consulted to dose.   INR remains therapeutic (INR 2.9 <3.1 < 2.9 <2.9 << 2.7, goal of 2.5-3.5).  Hgb stable, platelet count up 404 - no bleeding noted at this time.   Goal of Therapy:  INR 2.5-3.5  Monitor platelets by anticoagulation protocol: Yes   Plan:    Continue with Warfarin 7.5 mg x 1 again today   Daily PT/INR   CBC weekly on Mondays with weekly bmet   Monitor for any signs/symptoms of bleeding  Frank Novelo A. Jeanella Craze, PharmD, BCPS, FNKF Clinical Pharmacist [place] Please utilize Amion for appropriate phone number to reach the unit pharmacist [facility])   05/22/2019, 10:29 AM

## 2019-05-23 DIAGNOSIS — Z9981 Dependence on supplemental oxygen: Secondary | ICD-10-CM

## 2019-05-23 LAB — GLUCOSE, CAPILLARY
Glucose-Capillary: 105 mg/dL — ABNORMAL HIGH (ref 70–99)
Glucose-Capillary: 111 mg/dL — ABNORMAL HIGH (ref 70–99)
Glucose-Capillary: 120 mg/dL — ABNORMAL HIGH (ref 70–99)
Glucose-Capillary: 130 mg/dL — ABNORMAL HIGH (ref 70–99)
Glucose-Capillary: 143 mg/dL — ABNORMAL HIGH (ref 70–99)
Glucose-Capillary: 160 mg/dL — ABNORMAL HIGH (ref 70–99)

## 2019-05-23 LAB — PROTIME-INR
INR: 2.5 — ABNORMAL HIGH (ref 0.8–1.2)
Prothrombin Time: 27 seconds — ABNORMAL HIGH (ref 11.4–15.2)

## 2019-05-23 MED ORDER — WARFARIN SODIUM 5 MG PO TABS
10.0000 mg | ORAL_TABLET | Freq: Once | ORAL | Status: AC
  Administered 2019-05-23: 10 mg via ORAL
  Filled 2019-05-23: qty 2

## 2019-05-23 NOTE — Progress Notes (Signed)
[facility] PHYSICAL MEDICINE & REHABILITATION PROGRESS NOTE  Subjective/Complaints: Patient seen sitting up in bed this morning.  He states he slept well overnight.  He again asks for water.  Educated again.  ROS: Denies CP, shortness of breath, nausea, vomiting, diarrhea.  Objective: Vital Signs: Blood pressure 115/74, pulse 68, temperature 98.2 F (36.8 C), resp. rate 19, height 5\' 11"  (1.803 m), weight 97.4 kg, SpO2 100 %. DG Swallowing Func-Speech Pathology  Result Date: 05/22/2019 Objective Swallowing Evaluation: Type of Study: MBS-Modified Barium Swallow Study  Patient Details Name: Jon [name] MRN: [name] Date of Birth: 11-Mar-1966 Today's Date: 05/22/2019 Time: SLP Start Time (ACUTE ONLY): 1116 -SLP Stop Time (ACUTE ONLY): 1201 SLP Time Calculation (min) (ACUTE ONLY): 45 min Past Medical History: Past Medical History: Diagnosis Date . Aneurysm of thoracic aorta (HCC) 05/06/2012  IMO routine update IMO routine update . Anxiety   sees Dr. 05/08/2012 . Aortic aneurysm and dissection (HCC) 05/06/2012 . Aortic stenosis   mechanical AVR 04/22/12 . Aortic valve disorder 05/06/2012 . 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 Bicuspid aortic valve  . Cerebral infarction (HCC) 05/11/2012 . Cerebral thrombosis with cerebral infarction (HCC) 07/14/2013 . Cholelithiasis 12/20/2016 . Current use of long term anticoagulation 12/20/2016 . CVA (cerebral infarction)   occipital CVA 12/13 . Dissecting aortic aneurysm (HCC) 05/06/2012 . Dysarthria 09/10/2017 . Dysrhythmia   sees Dr. 11/10/2017, [facility] cardiology in [place] . Generalized constriction of visual field 10/15/2012  IMOUPDATE IMOUPDATE . 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 GERD (gastroesophageal reflux disease)  . HCAP (healthcare-associated pneumonia) 05/06/2012 . Headache(784.0)   "due to vision problem" . History of CVA (cerebrovascular accident) 05/07/2012 . Hypothyroidism  . Idiopathic peripheral  neuropathy 07/14/2013 . Late effects of CVA (cerebrovascular accident) 08/08/2015 . Migraine  . 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 Paroxysmal atrial fibrillation (HCC) 08/08/2015  On warfarin On warfarin . Presence of prosthetic heart valve 08/08/2015 . Shortness of breath  . Stroke (HCC)   x3 . Stroke, embolic (HCC) 05/07/2012 . Syncope 05/06/2012 . Thoracic aortic aneurysm (HCC)   replacement aortic root 04/22/12 . VBI (vertebrobasilar insufficiency) 09/10/2017 Past Surgical History: Past Surgical History: Procedure Laterality Date . APPLICATION OF WOUND VAC  06/11/2012  Procedure: APPLICATION OF WOUND VAC;  Surgeon: 08/09/2012, MD;  Location: [facility] OR;  Service: Vascular;  Laterality: Right; . CARDIAC CATHETERIZATION   . CHOLECYSTECTOMY   . CORONARY ARTERY BYPASS GRAFT  04/22/2012  Procedure: CORONARY ARTERY BYPASS GRAFTING (CABG);  Surgeon: 04/24/2012, MD;  Location: [facility] OR;  Service: Open Heart Surgery;  Laterality: N/A;  times one with right greater saphenous vein graft . I & D EXTREMITY  06/11/2012  Procedure: IRRIGATION AND DEBRIDEMENT EXTREMITY;  Surgeon: 08/09/2012, MD;  Location: [facility] OR;  Service: Vascular;  Laterality: Right; . INTRAOPERATIVE TRANSESOPHAGEAL ECHOCARDIOGRAM  04/22/2012  Procedure: INTRAOPERATIVE TRANSESOPHAGEAL ECHOCARDIOGRAM;  Surgeon: 04/24/2012, MD;  Location: [facility] OR;  Service: Open Heart Surgery;  Laterality: N/A; . IR CT HEAD LTD  05/08/2019 . IR PERCUTANEOUS ART THROMBECTOMY/INFUSION INTRACRANIAL INC DIAG ANGIO  05/08/2019 . IR 07/06/2019 GUIDE VASC ACCESS RIGHT  05/08/2019 . PERMANENT PACEMAKER INSERTION  10/2018  [employer] dual PPM . RADIOLOGY WITH ANESTHESIA N/A 05/08/2019  Procedure: IR WITH ANESTHESIA;  Surgeon: Radiologist, Medication, MD;  Location: [facility] OR;  Service: Radiology;  Laterality: N/A; . ROTATOR CUFF REPAIR    12 years ago, Left . THORACIC AORTIC ANEURYSM REPAIR  04/22/2012  Procedure: THORACIC ASCENDING  ANEURYSM REPAIR  (AAA);  Surgeon: [name], MD;  Location: [facility] OR;  Service: Open Heart Surgery;  Laterality: N/A; HPI: 54 yo admitted with right weakness and left gaze with aphasia with acute left ACA anterior territory CVA s/p revascularization. VDRF 1/1-1/2. PMhx: dysarthria, aortic dissection s/p repair, AVR, GERD, CVA x 3. Prior CVA resulted in verbosity, cognitive impairment including impulsivity, mild dysphagia. In 2014 MBS recommended regualr thin with a chin tuck.  No data recorded Assessment / Plan / Recommendation CHL IP CLINICAL IMPRESSIONS 05/22/2019 Clinical Impression Pt with severe oral phase dysphagia that results in continued pharyngeal deficits in timing of swallow initiation. Pt demonstrates right anterior spillage, lingual pumping and decreased bolus cohesion across puree, honey thick liquids, nectar thick liquids and thin liquids.  As a result, pt has moderate oral residue post-swallow with all consistencies assessed. Oral phase deficits also result in premature spillage of all liquid boluses, penetration and inconsistent aspiration during swallow. Aspiration is not always sensed. Chin tuck was helpful in increasing time for swallow initiation however pt had moderate amounts of oral residue that he aspirated post swallow. Suspect this is reason that pt had recent dx of aspiration pneumonia. In an effort to reduce oral residue (after the swallow) thin liquids via tsp were assessed with good airway protection established with chin tuck and decreased oral residue post swallow. However, pt not able to consistently condition to recieving thin liquid bolus and performing chin tuck during this study. If pt doesn't uitlize chin tuck he had sensed aspiration of thin liquids. Pt continues at high risk of aspiration given severity of oral phase dysphagia. At this time, conservative diet would be dysphagia 1 with pudding thick liquids, medicine crushed in puree. Recommend thin liquids via tsp be administered at  bedside by SLP only to target use of chin tuck and facilitate swiftness of oral transit and pharyngeal phase. SLP Visit Diagnosis Dysphagia, oropharyngeal phase (R13.12) Attention and concentration deficit following -- Frontal lobe and executive function deficit following -- Impact on safety and function Severe aspiration risk;Risk for inadequate nutrition/hydration   CHL IP TREATMENT RECOMMENDATION 05/10/2019 Treatment Recommendations F/U MBS in --- days (Comment)   Prognosis 05/08/2012 Prognosis for Safe Diet Advancement Good Barriers to Reach Goals Cognitive deficits Barriers/Prognosis Comment -- CHL IP DIET RECOMMENDATION 05/22/2019 SLP Diet Recommendations Dysphagia 1 (Puree) solids;Honey thick liquids Liquid Administration via Spoon Medication Administration Via alternative means Compensations Slow rate;Small sips/bites Postural Changes Seated upright at 90 degrees   CHL IP OTHER RECOMMENDATIONS 05/22/2019 Recommended Consults -- Oral Care Recommendations Oral care BID Other Recommendations Order thickener from pharmacy;Prohibited food (jello, ice cream, thin soups);Remove water pitcher;Have oral suction available   CHL IP FOLLOW UP RECOMMENDATIONS 05/12/2019 Follow up Recommendations Inpatient Rehab   CHL IP FREQUENCY AND DURATION 05/10/2019 Speech Therapy Frequency (ACUTE ONLY) min 2x/week Treatment Duration --      CHL IP ORAL PHASE 05/22/2019 Oral Phase Impaired Oral - Pudding Teaspoon -- Oral - Pudding Cup -- Oral - Honey Teaspoon Decreased bolus cohesion;Delayed oral transit;Premature spillage;Reduced posterior propulsion;Lingual pumping;Weak lingual manipulation Oral - Honey Cup -- Oral - Nectar Teaspoon Weak lingual manipulation;Lingual pumping;Decreased bolus cohesion;Premature spillage;Delayed oral transit Oral - Nectar Cup -- Oral - Nectar Straw -- Oral - Thin Teaspoon Weak lingual manipulation;Lingual pumping;Delayed oral transit;Decreased bolus cohesion;Premature spillage Oral - Thin Cup NT Oral - Thin Straw  Weak lingual manipulation;Lingual pumping;Delayed oral transit;Decreased bolus cohesion;Premature spillage Oral - Puree Weak lingual manipulation;Lingual pumping;Delayed oral transit;Decreased bolus cohesion Oral - Mech  Soft -- Oral - Regular -- Oral - Multi-Consistency -- Oral - Pill -- Oral Phase - Comment --  CHL IP PHARYNGEAL PHASE 05/22/2019 Pharyngeal Phase Impaired Pharyngeal- Pudding Teaspoon -- Pharyngeal -- Pharyngeal- Pudding Cup -- Pharyngeal -- Pharyngeal- Honey Teaspoon Delayed swallow initiation-vallecula;Penetration/Aspiration during swallow Pharyngeal Material enters airway, passes BELOW cords without attempt by patient to eject out (silent aspiration);Material enters airway, passes BELOW cords and not ejected out despite cough attempt by patient Pharyngeal- Honey Cup -- Pharyngeal -- Pharyngeal- Nectar Teaspoon Delayed swallow initiation-pyriform sinuses;Penetration/Aspiration during swallow Pharyngeal Material enters airway, passes BELOW cords without attempt by patient to eject out (silent aspiration);Material enters airway, passes BELOW cords and not ejected out despite cough attempt by patient Pharyngeal- Nectar Cup -- Pharyngeal -- Pharyngeal- Nectar Straw -- Pharyngeal -- Pharyngeal- Thin Teaspoon Delayed swallow initiation-pyriform sinuses;Penetration/Aspiration during swallow Pharyngeal Material enters airway, passes BELOW cords without attempt by patient to eject out (silent aspiration);Material enters airway, passes BELOW cords and not ejected out despite cough attempt by patient Pharyngeal- Thin Cup -- Pharyngeal -- Pharyngeal- Thin Straw Delayed swallow initiation-pyriform sinuses;Penetration/Aspiration during swallow Pharyngeal Material enters airway, passes BELOW cords without attempt by patient to eject out (silent aspiration) Pharyngeal- Puree Delayed swallow initiation-vallecula Pharyngeal -- Pharyngeal- Mechanical Soft -- Pharyngeal -- Pharyngeal- Regular -- Pharyngeal --  Pharyngeal- Multi-consistency -- Pharyngeal -- Pharyngeal- Pill -- Pharyngeal -- Pharyngeal Comment --  CHL IP CERVICAL ESOPHAGEAL PHASE 05/22/2019 Cervical Esophageal Phase WFL Pudding Teaspoon -- Pudding Cup -- Honey Teaspoon -- Honey Cup -- Nectar Teaspoon -- Nectar Cup -- Nectar Straw -- Thin Teaspoon -- Thin Cup -- Thin Straw -- Puree -- Mechanical Soft -- Regular -- Multi-consistency -- Pill -- Cervical Esophageal Comment -- Happi Overton 05/22/2019, 11:59 AM              No results for input(s): WBC, HGB, HCT, PLT in the last 72 hours. Recent Labs    05/21/19 0510  NA 140  K 4.3  CL 104  CO2 30  GLUCOSE 136*  BUN 17  CREATININE 0.95  CALCIUM 9.0    Physical Exam: BP 115/74 (BP Location: Left Arm)   Pulse 68   Temp 98.2 F (36.8 C)   Resp 19   Ht 5\' 11"  (1.803 m)   Wt 97.4 kg   SpO2 100%   BMI 29.95 kg/m  Constitutional: No distress . Vital signs reviewed.  Obese. HENT: Normocephalic.  Atraumatic.  + NG. Eyes: EOMI. No discharge. Cardiovascular: No JVD. Respiratory: Normal effort.  No stridor.  + [place]. GI: Non-distended. Skin: Warm and dry.  Intact. Psych: Difficult to assess due to dysarthria Musc: Mild RUE edema Neurological: Alert Severe dysarthria, unchanged Able to follow simple motor commands.  Motor:  RUE: 4-4+/5 proximal distal RLE: Hip flexion, knee extension 4 +/5, dorsiflexion 4+/5  Assessment/Plan: 1. 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  Physiatrist is providing close team supervision and 24 hour management of active medical problems listed below.  Physiatrist and rehab team continue to assess barriers to discharge/monitor patient progress toward functional and medical goals  Care Tool:  Bathing    Body parts bathed by patient: Right arm, Abdomen, Right upper leg, Left upper leg, Face   Body parts bathed by  helper: Left arm, Front perineal area, Buttocks, Left lower leg, Right lower leg     Bathing assist Assist Level: Maximal Assistance - Patient 24 -  49%     Upper Body Dressing/Undressing Upper body dressing   What is the patient wearing?: Pull over shirt    Upper body assist Assist Level: Moderate Assistance - Patient 50 - 74%    Lower Body Dressing/Undressing Lower body dressing      What is the patient wearing?: Underwear/pull up, Pants     Lower body assist Assist for lower body dressing: Minimal Assistance - Patient > 75%     Toileting Toileting    Toileting assist Assist for toileting: Minimal Assistance - Patient > 75%     Transfers Chair/bed transfer  Transfers assist     Chair/bed transfer assist level: Moderate Assistance - Patient 50 - 74%     Locomotion Ambulation   Ambulation assist   Ambulation activity did not occur: Safety/medical concerns(fatigue, LE weakness, restricted to threapy in room due to IV fluids)  Assist level: 2 helpers Assistive device: Walker-rolling Max distance: 4880ft   Walk 10 feet activity   Assist  Walk 10 feet activity did not occur: Safety/medical concerns(fatigue, LE weakness, restricted to threapy in room due to IV fluids)  Assist level: 2 helpers Assistive device: Walker-rolling   Walk 50 feet activity   Assist Walk 50 feet with 2 turns activity did not occur: Safety/medical concerns(fatigue, LE weakness, restricted to threapy in room due to IV fluids)  Assist level: 2 helpers Assistive device: Walker-rolling    Walk 150 feet activity   Assist Walk 150 feet activity did not occur: Safety/medical concerns(fatigue, LE weakness, restricted to threapy in room due to IV fluids)         Walk 10 feet on uneven surface  activity   Assist Walk 10 feet on uneven surfaces activity did not occur: Safety/medical concerns(fatigue, LE weakness, restricted to threapy in room due to IV fluids)          Wheelchair     Assist Will patient use wheelchair at discharge?: Yes Type of Wheelchair: Manual Wheelchair activity did not occur: Safety/medical concerns(fatigue, R hemi, restricted to threapy in room due to IV fluids)         Wheelchair 50 feet with 2 turns activity    Assist    Wheelchair 50 feet with 2 turns activity did not occur: Safety/medical concerns(fatigue, R hemi, restricted to threapy in room due to IV fluids)       Wheelchair 150 feet activity     Assist Wheelchair 150 feet activity did not occur: Safety/medical concerns(fatigue, R hemi, restricted to threapy in room due to IV fluids)          Medical Problem List and Plan: 1.  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  Continue CIR 2.  Antithrombotics: -DVT/anticoagulation:  Pharmaceutical: Coumadin goal 2.5-3.5.   INR therapeutic on 1/16  -antiplatelet therapy: on ASA 3. Pain Management: tylenol prn 4. Mood: LCSW to follow for evaluation and support when appropriate.              -antipsychotic agents: N/A 5. Neuropsych: This patient is not fully capable of making decisions on his own behalf. 6. Skin/Wound Care: Routine pressure relief measures.  7. Fluids/Electrolytes/Nutrition: NPO. Continue tube feeds, added water flushes.  8. Aspiration PNA: Completed 7 day course of Unasyn on 1/12. Needs to be upright with pulmonary toilet.    Wean supplemental oxygen as tolerated, continues to require 9. CAF/CHB s/p PPM:   Monitor HR Off amiodarone and  Entresto due to issues with hypotension.  11 COPD/OSA: Duonebs and incruse resumed. Encourage IS and keep HOB>30 degrees.   Communicated medication adjustments with respiratory therapy 12. Depression/anxiety disorder: Emotional support 13. H/o migraines: Has been managed on Topamax and gabapentin.  14. Hypokalemia:   Kdur DC'd on  1/11  Potassium 4.3 on 1/14, labs ordered for Monday  Continue to monitor 15.  Hyperglycemia-secondary to tube feeds  Slightly labile on 1/16  Continue to monitor 16.  Leukocytosis: Resolved  WBCs 9.0 on 1/13  Afebrile  Continue to monitor 17.  Acute blood loss anemia  Hemoglobin 11.0 on 1/13, labs ordered for Monday  Continue to monitor 18.  Hypoalbuminemia  Adjust with tube feeds 19.  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  Loose stools  Discussed changing tube feeds with dietary  KUB reviewed, unremarkable  Improving after completion of abx 21.  Post stroke dysphagia  Continue tube feeds  Advance D1 pudding   Continue to advance as tolerated   LOS: 10 days A FACE TO FACE EVALUATION WAS PERFORMED  Ahriyah Vannest Lorie Phenix 05/23/2019, 10:19 AM

## 2019-05-23 NOTE — Progress Notes (Signed)
ANTICOAGULATION CONSULT NOTE  Pharmacy Consult for Warfarin Indication: stroke and mechanical valve, atrial fibrillation  Allergies  Allergen Reactions  . Hydrocodone-Acetaminophen Other (See Comments)    PT MOTHER DOES NOT REMEMBER REACTION  Only use in Emergency   . Hydromorphone Itching and Rash       . Other Other (See Comments)    ALL NARCOTICS-ITCHING, RASH   . Oxycodone Other (See Comments)    Unknown reaction  . Penicillins Rash    Did it involve swelling of the face/tongue/throat, SOB, or low BP? Unknown Did it involve sudden or severe rash/hives, skin peeling, or any reaction on the inside of your mouth or nose? Unknown Did you need to seek medical attention at a hospital or doctor's office? Unknown When did it last happen?unknown If all above answers are "NO", may proceed with cephalosporin use.  . Dilaudid [Hydromorphone Hcl] Itching and Rash    Patient Measurements: Total Body Weight: 97.2 kg Height: 71 inches  Vital Signs: Temp: 98.2 F (36.8 C) (01/16 0443) BP: 115/74 (01/16 0443) Pulse Rate: 68 (01/16 0900)  Labs: Recent Labs    05/21/19 0510 05/22/19 0455 05/23/19 0711  LABPROT 31.7* 30.4* 27.0*  INR 3.1* 2.9* 2.5*  CREATININE 0.95  --   --     Estimated Creatinine Clearance: 107 mL/min (by C-G formula based on SCr of 0.95 mg/dL).   Assessment: 54 yr old male presented to the ED with stroke symptoms after recent discharge from [facility]. He is supposed to be on warfarin and Lovenox for history of a mechanical valve.  Patient is s/p thrombectomy and s/p bridging with Heparin to therapeutic INR with warfarin. Pharmacy consulted to dose.   INR remains therapeutic though trended down (INR 2.5 << 2.9, goal of 2.5-3.5). No CBC since 1/13 - was stable at that time. No bleeding noted.   It is noted that the patient started a dysphagia I diet - will monitor intake for effects on warfarin.   Goal of Therapy:  INR 2.5-3.5  Monitor  platelets by anticoagulation protocol: Yes   Plan:  - Warfarin 10 mg x 1 dose at 1800 today - Daily PT/INR, CBC q72h - Will continue to monitor for any signs/symptoms of bleeding and will follow up with PT/INR in the a.m.    Thank you for allowing pharmacy to be a part of this patient's care.  Georgina Pillion, PharmD, BCPS Clinical Pharmacist Clinical phone for 05/23/2019: [phone] 05/23/2019 9:37 AM   **Pharmacist phone directory can now be found on [email or web address] ([email or web address]).  Listed under [facility].

## 2019-05-24 ENCOUNTER — Inpatient Hospital Stay (HOSPITAL_COMMUNITY): Payer: Medicaid Other

## 2019-05-24 ENCOUNTER — Inpatient Hospital Stay (HOSPITAL_COMMUNITY): Payer: Medicaid Other | Admitting: Speech Pathology

## 2019-05-24 LAB — PROTIME-INR
INR: 2.4 — ABNORMAL HIGH (ref 0.8–1.2)
Prothrombin Time: 26.3 seconds — ABNORMAL HIGH (ref 11.4–15.2)

## 2019-05-24 LAB — GLUCOSE, CAPILLARY
Glucose-Capillary: 120 mg/dL — ABNORMAL HIGH (ref 70–99)
Glucose-Capillary: 123 mg/dL — ABNORMAL HIGH (ref 70–99)
Glucose-Capillary: 131 mg/dL — ABNORMAL HIGH (ref 70–99)
Glucose-Capillary: 132 mg/dL — ABNORMAL HIGH (ref 70–99)
Glucose-Capillary: 138 mg/dL — ABNORMAL HIGH (ref 70–99)
Glucose-Capillary: 222 mg/dL — ABNORMAL HIGH (ref 70–99)

## 2019-05-24 MED ORDER — WARFARIN SODIUM 5 MG PO TABS
10.0000 mg | ORAL_TABLET | Freq: Once | ORAL | Status: AC
  Administered 2019-05-24: 10 mg via ORAL
  Filled 2019-05-24: qty 2

## 2019-05-24 NOTE — Progress Notes (Signed)
ANTICOAGULATION CONSULT NOTE  Pharmacy Consult for Warfarin Indication: stroke and mechanical valve, atrial fibrillation  Allergies  Allergen Reactions  . Hydrocodone-Acetaminophen Other (See Comments)    PT MOTHER DOES NOT REMEMBER REACTION  Only use in Emergency   . Hydromorphone Itching and Rash       . Other Other (See Comments)    ALL NARCOTICS-ITCHING, RASH   . Oxycodone Other (See Comments)    Unknown reaction  . Penicillins Rash    Did it involve swelling of the face/tongue/throat, SOB, or low BP? Unknown Did it involve sudden or severe rash/hives, skin peeling, or any reaction on the inside of your mouth or nose? Unknown Did you need to seek medical attention at a hospital or doctor's office? Unknown When did it last happen?unknown If all above answers are "NO", may proceed with cephalosporin use.  . Dilaudid [Hydromorphone Hcl] Itching and Rash    Patient Measurements: Total Body Weight: 97.2 kg Height: 71 inches  Vital Signs: Temp: 97.7 F (36.5 C) (01/17 0435) BP: 118/70 (01/17 0435) Pulse Rate: 64 (01/17 0435)  Labs: Recent Labs    05/22/19 0455 05/23/19 0711 05/24/19 0734  LABPROT 30.4* 27.0* 26.3*  INR 2.9* 2.5* 2.4*    Estimated Creatinine Clearance: 107 mL/min (by C-G formula based on SCr of 0.95 mg/dL).   Assessment: 54 yr old male presented to the ED with stroke symptoms after recent discharge from [facility]. He is supposed to be on warfarin and Lovenox for history of a mechanical valve.  Patient is s/p thrombectomy and s/p bridging with Heparin to therapeutic INR with warfarin. Pharmacy consulted to dose.   Today, INR subtherapeutic at 2.4 after dose increase yesterday. No CBC since 1/13 - was stable at that time. No bleeding noted.   It is noted that the patient started a dysphagia I diet but only 20- 25% intake recorded since 1/15.  Goal of Therapy:  INR 2.5-3.5  Monitor platelets by anticoagulation protocol: Yes   Plan:   - Warfarin 10 mg x 1 dose at 1800 today - Daily PT/INR, CBC q72h - Will continue to monitor for any signs/symptoms of bleeding and will follow up with PT/INR in the a.m.     Harlow Mares, PharmD PGY2 Pharmacy Resident Phone [phone]  05/24/2019   8:56 AM

## 2019-05-24 NOTE — Progress Notes (Signed)
Physical Therapy Session Note  Patient Details  Name: Jon [name] MRN: [id number] Date of Birth: [birth year]  Today's Date: 05/24/2019 PT Individual Time: 1000-1100 PT Individual Time Calculation (min): 60 min   Short Term Goals: Week 1:  PT Short Term Goal 1 (Week 1): Pt will perform bed mobility with min A PT Short Term Goal 1 - Progress (Week 1): Met PT Short Term Goal 2 (Week 1): Pt will transfer stand<>pivot with LRAD mod A PT Short Term Goal 2 - Progress (Week 1): Met PT Short Term Goal 3 (Week 1): Pt will perform car transfer mod A PT Short Term Goal 3 - Progress (Week 1): Progressing toward goal Week 2:  PT Short Term Goal 1 (Week 2): Pt will perform bed mobility with CGA PT Short Term Goal 2 (Week 2): Pt will perform stand<>pivot transfer with LRAD min A PT Short Term Goal 3 (Week 2): Pt will perform car transfer with LRAD mod A Week 3:     Skilled Therapeutic Interventions/Progress Updates:    PAIN  C/o headach 10/10, nursing notified pt requesting tylenol.  Treatment to tolerance.    Pt initially supine and sleeping, faily easliy aroused.  Pt placed on 3L 02 via [place] as this was setting he was on at rest.  Pt stating he needed to get dressed.  Pt removed brief mod I[place].  Supine to side to sit w/mod assit due to R lean and impulsive movement.  Nursing called to dc feeding tube.  Pt assisted w/threadng underwear and pants, min assist for balance and for completing pants to hips.  Pt removes shirt w/cga to min assist for balance, donns clean shirt w/set up, mod assist to complete, min assist for balance.  STS w/mod assist and pt raises pants w/mod assist for balance/dressing.  Pt then became agitated due to urgent need to urinate.  Required assist to place and maintain urinal position while voiding, pt w/some incontinence requiring changing of newly donned pants and underwear.  See above for repeat of this activity.  Pt impulsive throughout these activities.  SPT bed to wc w/min  assist, set up, and cues for safety.  wc propulsion x 76f using bilat LE's as bipedal task, preparation for gait, LE strengthening. STS from wc to r.walker w/min assist.  Gait 1157fwincluding turning 180* w/RW and min assist for balance, verbal cues for improved posture and step length/clearance, and second person assist to manage wc/02 tank.  HR 77, 02 sats 97%.  Second gait effort on 2.5L 02 x 7561from hall to bed as above but increased cues required for posture/clearance/safety.  Turn sit to bed w/cues, min assist, sit to supine w/supervision.   o2 sats 96%, HR 66. Pt is impulsive throughout session and not receptive to suggestion/request to sit w/fatigue/decline in gait pattern/decreased safety.  Resistant to use of gait belt but did reluctantly agree when therapist educated on purpose/safety.    Pt left supine w/rails up x 4, alarm set, bed in lowest position, and needs in reach. Therapy Documentation Precautions:  Precautions Precautions: Fall Precaution Comments: R hemi, dysarthria, dysphagia, on 4L O2 via [place], NG tube, and NPO Restrictions Weight Bearing Restrictions: No    Therapy/Group: Individual Therapy  BarCallie FieldingT [place]17/2021, 1:09 PM

## 2019-05-24 NOTE — Progress Notes (Signed)
Speech Language Pathology Daily Session Note  Patient Details  Name: Jon [name] MRN: [id number] Date of Birth: 1965/09/01  Today's Date: 05/24/2019 SLP Individual Time: 1300-1355 SLP Individual Time Calculation (min): 55 min  Short Term Goals: Week 1: SLP Short Term Goal 1 (Week 1): Following improved chest x-rays and medical clearance, pt will particiate in instrumental swallow study to determine least restrictive diet. SLP Short Term Goal 1 - Progress (Week 1): Met SLP Short Term Goal 2 (Week 1): Pt will utilize speech intelligibility strategies to increase intelligibility to 60% at the word level with Max A verbal cues. SLP Short Term Goal 2 - Progress (Week 1): Met SLP Short Term Goal 3 (Week 1): Pt will initiate use of picture and/or letter communication board to communicate complex messages when he is not understood by staff in 3/5 opportunities with Min A cues. SLP Short Term Goal 3 - Progress (Week 1): Met SLP Short Term Goal 4 (Week 1): Pt will demonstrate basic problem solving during functional tasks with Mod A verbal/visual cues. SLP Short Term Goal 4 - Progress (Week 1): Met SLP Short Term Goal 6 (Week 1): Pt will participate in MBSS for instrumental assessment of oropharyngeal swallow function.  Skilled Therapeutic Interventions: Pt was seen for skilled ST targeting dysphagia and speech goals. SLP facilitated session with review of pt's recent MBSS; pt recalled he was unhappy with results and diet reccs, however required education regarding aspiration risk, correlation between breathing/swallowing sequence/his current decreased respiratory status, etc. SLP also provided trials of ice and thin H2O following pt's completion of thorough oral care via suction. Pt exhibited immediate cough in 2/3 thin trials via teaspoon. Throat clear noted in 3rd trial. Pt likely with better oral control and suspect timely swallow sequence of ice chips, as only 1 cough observed out of 6 trials  (however recent MBSS also noted aspiration was no always sensed). Pt required Mod A verbal cues to implement chin tuck and/or accuracy of chin tuck during thin intake. Pt later self fed 90% applesauce container with Mod A verbal cues for use of slow rate. Total oral clearance achieved and no s/sx aspiration with puree. SLP also provided opportunities to target pt's speech intelligibility, which appears to be improving when he implements pacing and overarticulation. Pt still requires fluctuating Mod-Max A verbal cues for repeating with use of strategies, however improved ability to use overarticulation noted.  Max A verbal cues required during 1st half of session, however cueing could then decrease to Mod A and pt was ~60-65% intelligible when communicating at phrase level. Pt expressed feeling frustrated at his current level of assist and overall discouraged at prognosis. Poor safety awareness evidenced by his statement "I want to get up and go to the bathroom, so what if I fall?". Pt also stated "If I had another stroke I wasn't supposed to be here." Pt seems to be struggling emotionally and may benefit from neuropsychologist consult. Pt left laying in bed with alarm set and needs met. Continue per current plan of care.       Pain Pain Assessment Pain Scale: 0-10 Pain Score: 0-No pain   Therapy/Group: Individual Therapy  Arbutus Leas 05/24/2019, 2:41 PM

## 2019-05-24 NOTE — Progress Notes (Signed)
[place] PHYSICAL MEDICINE & REHABILITATION PROGRESS NOTE  Subjective/Complaints: Patient seen sitting up in bed this morning.  No reported issues overnight.  Patient remains severely dysarthric with increased rate of speech.  Attempted to cue patient multiple times to decrease weight, however patient persists making speech unintelligible.  He does appear to ask for water.  Educated again.  ROS: Appears to deny CP, shortness of breath, nausea, vomiting, diarrhea.  Objective: Vital Signs: Blood pressure 118/70, pulse 64, temperature 97.7 F (36.5 C), resp. rate 18, height 5\' 11"  (1.803 m), weight 97.4 kg, SpO2 97 %. DG Swallowing Func-Speech Pathology  Result Date: 05/22/2019 Objective Swallowing Evaluation: Type of Study: MBS-Modified Barium Swallow Study  Patient Details Name: Jon [name] MRN: [id number] Date of Birth: 1965/10/03 Today's Date: 05/22/2019 Time: SLP Start Time (ACUTE ONLY): 1116 -SLP Stop Time (ACUTE ONLY): 1201 SLP Time Calculation (min) (ACUTE ONLY): 45 min Past Medical History: Past Medical History: Diagnosis Date . Aneurysm of thoracic aorta ([place]) 05/06/2012  IMO routine update IMO routine update . Anxiety   sees Dr. Wende Neighbors . Aortic aneurysm and dissection ([place]) 05/06/2012 . Aortic stenosis   mechanical AVR 04/22/12 . Aortic valve disorder 05/06/2012 . 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 Bicuspid aortic valve  . Cerebral infarction ([place]) 05/11/2012 . Cerebral thrombosis with cerebral infarction ([place]) 07/14/2013 . Cholelithiasis 12/20/2016 . Current use of long term anticoagulation 12/20/2016 . CVA (cerebral infarction)   occipital CVA 12/13 . Dissecting aortic aneurysm ([place]) 05/06/2012 . Dysarthria 09/10/2017 . Dysrhythmia   sees Dr. Leslie Dales, [facility] cardiology in [place] . Generalized constriction of visual field 10/15/2012  IMOUPDATE IMOUPDATE . 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 GERD (gastroesophageal reflux disease)  .  HCAP (healthcare-associated pneumonia) 05/06/2012 . Headache(784.0)   "due to vision problem" . History of CVA (cerebrovascular accident) 05/07/2012 . Hypothyroidism  . Idiopathic peripheral neuropathy 07/14/2013 . Late effects of CVA (cerebrovascular accident) 08/08/2015 . Migraine  . Nontraumatic multiple localized intracerebral hemorrhages ([place]) 10/16/2017 . Paroxysmal atrial fibrillation ([place]) 08/08/2015  On warfarin On warfarin . Presence of prosthetic heart valve 08/08/2015 . Shortness of breath  . Stroke ([place])   x3 . Stroke, embolic ([place]) 10/10/4401 . Syncope 05/06/2012 . Thoracic aortic aneurysm ([place])   replacement aortic root 04/22/12 . VBI (vertebrobasilar insufficiency) 09/10/2017 Past Surgical History: Past Surgical History: Procedure Laterality Date . APPLICATION OF WOUND VAC  08/11/4257  Procedure: APPLICATION OF WOUND VAC;  Surgeon: Melrose Nakayama, MD;  Location: [place];  Service: Vascular;  Laterality: Right; . CARDIAC CATHETERIZATION   . CHOLECYSTECTOMY   . CORONARY ARTERY BYPASS GRAFT  04/22/2012  Procedure: CORONARY ARTERY BYPASS GRAFTING (CABG);  Surgeon: Melrose Nakayama, MD;  Location: [place];  Service: Open Heart Surgery;  Laterality: N/A;  times one with right greater saphenous vein graft . I & D EXTREMITY  06/11/2012  Procedure: IRRIGATION AND DEBRIDEMENT EXTREMITY;  Surgeon: Melrose Nakayama, MD;  Location: [place];  Service: Vascular;  Laterality: Right; . INTRAOPERATIVE TRANSESOPHAGEAL ECHOCARDIOGRAM  04/22/2012  Procedure: INTRAOPERATIVE TRANSESOPHAGEAL ECHOCARDIOGRAM;  Surgeon: Melrose Nakayama, MD;  Location: [facility];  Service: Open Heart Surgery;  Laterality: N/A; . IR CT HEAD LTD  05/08/2019 . IR PERCUTANEOUS ART THROMBECTOMY/INFUSION INTRACRANIAL INC DIAG ANGIO  05/08/2019 . IR US GUIDE VASC ACCESS RIGHT  05/08/2019 . PERMANENT PACEMAKER INSERTION  10/2018  St. Jude's dual PPM . RADIOLOGY WITH ANESTHESIA N/A 05/08/2019  Procedure: IR WITH ANESTHESIA;  Surgeon: Radiologist, Medication, MD;   Location: [facility];  Service: Radiology;  Laterality: N/A; . ROTATOR CUFF REPAIR    12 years ago, Left . THORACIC AORTIC ANEURYSM REPAIR  04/22/2012  Procedure: THORACIC ASCENDING ANEURYSM REPAIR (AAA);  Surgeon: [name], MD;  Location: [facility];  Service: Open Heart Surgery;  Laterality: N/A; HPI: 54 yo admitted with right weakness and left gaze with aphasia with acute left ACA anterior territory CVA s/p revascularization. VDRF 1/1-1/2. PMhx: dysarthria, aortic dissection s/p repair, AVR, GERD, CVA x 3. Prior CVA resulted in verbosity, cognitive impairment including impulsivity, mild dysphagia. In 2014 MBS recommended regualr thin with a chin tuck.  No data recorded Assessment / Plan / Recommendation CHL IP CLINICAL IMPRESSIONS 05/22/2019 Clinical Impression Pt with severe oral phase dysphagia that results in continued pharyngeal deficits in timing of swallow initiation. Pt demonstrates right anterior spillage, lingual pumping and decreased bolus cohesion across puree, honey thick liquids, nectar thick liquids and thin liquids.  As a result, pt has moderate oral residue post-swallow with all consistencies assessed. Oral phase deficits also result in premature spillage of all liquid boluses, penetration and inconsistent aspiration during swallow. Aspiration is not always sensed. Chin tuck was helpful in increasing time for swallow initiation however pt had moderate amounts of oral residue that he aspirated post swallow. Suspect this is reason that pt had recent dx of aspiration pneumonia. In an effort to reduce oral residue (after the swallow) thin liquids via tsp were assessed with good airway protection established with chin tuck and decreased oral residue post swallow. However, pt not able to consistently condition to recieving thin liquid bolus and performing chin tuck during this study. If pt doesn't uitlize chin tuck he had sensed aspiration of thin liquids. Pt continues at high risk of aspiration given  severity of oral phase dysphagia. At this time, conservative diet would be dysphagia 1 with pudding thick liquids, medicine crushed in puree. Recommend thin liquids via tsp be administered at bedside by SLP only to target use of chin tuck and facilitate swiftness of oral transit and pharyngeal phase. SLP Visit Diagnosis Dysphagia, oropharyngeal phase (R13.12) Attention and concentration deficit following -- Frontal lobe and executive function deficit following -- Impact on safety and function Severe aspiration risk;Risk for inadequate nutrition/hydration   CHL IP TREATMENT RECOMMENDATION 05/10/2019 Treatment Recommendations F/U MBS in --- days (Comment)   Prognosis 05/08/2012 Prognosis for Safe Diet Advancement Good Barriers to Reach Goals Cognitive deficits Barriers/Prognosis Comment -- CHL IP DIET RECOMMENDATION 05/22/2019 SLP Diet Recommendations Dysphagia 1 (Puree) solids;Honey thick liquids Liquid Administration via Spoon Medication Administration Via alternative means Compensations Slow rate;Small sips/bites Postural Changes Seated upright at 90 degrees   CHL IP OTHER RECOMMENDATIONS 05/22/2019 Recommended Consults -- Oral Care Recommendations Oral care BID Other Recommendations Order thickener from pharmacy;Prohibited food (jello, ice cream, thin soups);Remove water pitcher;Have oral suction available   CHL IP FOLLOW UP RECOMMENDATIONS 05/12/2019 Follow up Recommendations Inpatient Rehab   CHL IP FREQUENCY AND DURATION 05/10/2019 Speech Therapy Frequency (ACUTE ONLY) min 2x/week Treatment Duration --      CHL IP ORAL PHASE 05/22/2019 Oral Phase Impaired Oral - Pudding Teaspoon -- Oral - Pudding Cup -- Oral - Honey Teaspoon Decreased bolus cohesion;Delayed oral transit;Premature spillage;Reduced posterior propulsion;Lingual pumping;Weak lingual manipulation Oral - Honey Cup -- Oral - Nectar Teaspoon Weak lingual manipulation;Lingual pumping;Decreased bolus cohesion;Premature spillage;Delayed oral transit Oral - Nectar  Cup -- Oral - Nectar Straw -- Oral - Thin Teaspoon Weak lingual manipulation;Lingual pumping;Delayed oral transit;Decreased bolus cohesion;Premature spillage Oral - Thin Cup  NT Oral - Thin Straw Weak lingual manipulation;Lingual pumping;Delayed oral transit;Decreased bolus cohesion;Premature spillage Oral - Puree Weak lingual manipulation;Lingual pumping;Delayed oral transit;Decreased bolus cohesion Oral - Mech Soft -- Oral - Regular -- Oral - Multi-Consistency -- Oral - Pill -- Oral Phase - Comment --  CHL IP PHARYNGEAL PHASE 05/22/2019 Pharyngeal Phase Impaired Pharyngeal- Pudding Teaspoon -- Pharyngeal -- Pharyngeal- Pudding Cup -- Pharyngeal -- Pharyngeal- Honey Teaspoon Delayed swallow initiation-vallecula;Penetration/Aspiration during swallow Pharyngeal Material enters airway, passes BELOW cords without attempt by patient to eject out (silent aspiration);Material enters airway, passes BELOW cords and not ejected out despite cough attempt by patient Pharyngeal- Honey Cup -- Pharyngeal -- Pharyngeal- Nectar Teaspoon Delayed swallow initiation-pyriform sinuses;Penetration/Aspiration during swallow Pharyngeal Material enters airway, passes BELOW cords without attempt by patient to eject out (silent aspiration);Material enters airway, passes BELOW cords and not ejected out despite cough attempt by patient Pharyngeal- Nectar Cup -- Pharyngeal -- Pharyngeal- Nectar Straw -- Pharyngeal -- Pharyngeal- Thin Teaspoon Delayed swallow initiation-pyriform sinuses;Penetration/Aspiration during swallow Pharyngeal Material enters airway, passes BELOW cords without attempt by patient to eject out (silent aspiration);Material enters airway, passes BELOW cords and not ejected out despite cough attempt by patient Pharyngeal- Thin Cup -- Pharyngeal -- Pharyngeal- Thin Straw Delayed swallow initiation-pyriform sinuses;Penetration/Aspiration during swallow Pharyngeal Material enters airway, passes BELOW cords without attempt by  patient to eject out (silent aspiration) Pharyngeal- Puree Delayed swallow initiation-vallecula Pharyngeal -- Pharyngeal- Mechanical Soft -- Pharyngeal -- Pharyngeal- Regular -- Pharyngeal -- Pharyngeal- Multi-consistency -- Pharyngeal -- Pharyngeal- Pill -- Pharyngeal -- Pharyngeal Comment --  CHL IP CERVICAL ESOPHAGEAL PHASE 05/22/2019 Cervical Esophageal Phase WFL Pudding Teaspoon -- Pudding Cup -- Honey Teaspoon -- Honey Cup -- Nectar Teaspoon -- Nectar Cup -- Nectar Straw -- Thin Teaspoon -- Thin Cup -- Thin Straw -- Puree -- Mechanical Soft -- Regular -- Multi-consistency -- Pill -- Cervical Esophageal Comment -- Happi Overton 05/22/2019, 11:59 AM              No results for input(s): WBC, HGB, HCT, PLT in the last 72 hours. No results for input(s): NA, K, CL, CO2, GLUCOSE, BUN, CREATININE, CALCIUM in the last 72 hours.  Physical Exam: BP 118/70 (BP Location: Left Arm)   Pulse 64   Temp 97.7 F (36.5 C)   Resp 18   Ht 5\' 11"  (1.803 m)   Wt 97.4 kg   SpO2 97%   BMI 29.95 kg/m  Constitutional: No distress . Vital signs reviewed.  Obese. HENT: Normocephalic.  Atraumatic.  + NG Eyes: EOMI. No discharge. Cardiovascular: No JVD. Respiratory: Normal effort.  No stridor. GI: Non-distended. Skin: Warm and dry.  Intact. Psych: Difficult to assess due to dysarthria.?  Anxious. Musc: Right upper extremity edema improving. Neurological: Alert Severe dysarthria, stable Able to follow simple motor commands.  Motor:  RUE: 4-4+/5 proximal distal, unchanged RLE: Hip flexion, knee extension 4 +/5, dorsiflexion 4+/5  Assessment/Plan: 1. 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  Physiatrist is providing close team supervision and 24 hour management of active medical problems listed below.  Physiatrist and rehab team continue to assess barriers to discharge/monitor  patient progress toward functional and medical goals  Care Tool:  Bathing    Body parts bathed by patient: Right arm, Abdomen, Right upper leg, Left upper leg, Face   Body parts bathed by helper: Left arm, Front perineal area, Buttocks, Left lower leg, Right lower  leg     Bathing assist Assist Level: Maximal Assistance - Patient 24 - 49%     Upper Body Dressing/Undressing Upper body dressing   What is the patient wearing?: Pull over shirt    Upper body assist Assist Level: Moderate Assistance - Patient 50 - 74%    Lower Body Dressing/Undressing Lower body dressing      What is the patient wearing?: Underwear/pull up, Pants     Lower body assist Assist for lower body dressing: Minimal Assistance - Patient > 75%     Toileting Toileting    Toileting assist Assist for toileting: Minimal Assistance - Patient > 75%     Transfers Chair/bed transfer  Transfers assist     Chair/bed transfer assist level: Moderate Assistance - Patient 50 - 74%     Locomotion Ambulation   Ambulation assist   Ambulation activity did not occur: Safety/medical concerns(fatigue, LE weakness, restricted to threapy in room due to IV fluids)  Assist level: 2 helpers Assistive device: Walker-rolling Max distance: 56ft   Walk 10 feet activity   Assist  Walk 10 feet activity did not occur: Safety/medical concerns(fatigue, LE weakness, restricted to threapy in room due to IV fluids)  Assist level: 2 helpers Assistive device: Walker-rolling   Walk 50 feet activity   Assist Walk 50 feet with 2 turns activity did not occur: Safety/medical concerns(fatigue, LE weakness, restricted to threapy in room due to IV fluids)  Assist level: 2 helpers Assistive device: Walker-rolling    Walk 150 feet activity   Assist Walk 150 feet activity did not occur: Safety/medical concerns(fatigue, LE weakness, restricted to threapy in room due to IV fluids)         Walk 10 feet on uneven surface   activity   Assist Walk 10 feet on uneven surfaces activity did not occur: Safety/medical concerns(fatigue, LE weakness, restricted to threapy in room due to IV fluids)         Wheelchair     Assist Will patient use wheelchair at discharge?: Yes Type of Wheelchair: Manual Wheelchair activity did not occur: Safety/medical concerns(fatigue, R hemi, restricted to threapy in room due to IV fluids)         Wheelchair 50 feet with 2 turns activity    Assist    Wheelchair 50 feet with 2 turns activity did not occur: Safety/medical concerns(fatigue, R hemi, restricted to threapy in room due to IV fluids)       Wheelchair 150 feet activity     Assist Wheelchair 150 feet activity did not occur: Safety/medical concerns(fatigue, R hemi, restricted to threapy in room due to IV fluids)          Medical Problem List and Plan: 1.  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  Continue CIR 2.  Antithrombotics: -DVT/anticoagulation:  Pharmaceutical: Coumadin goal 2.5-3.5.   INR subtherapeutic on 1/17  -antiplatelet therapy: on ASA 3. Pain Management: tylenol prn 4. Mood: LCSW to follow for evaluation and support when appropriate.              -antipsychotic agents: N/A 5. Neuropsych: This patient is not fully capable of making decisions on his own behalf. 6. Skin/Wound Care: Routine pressure relief measures.  7. Fluids/Electrolytes/Nutrition: NPO. Continue tube feeds, added water flushes.  8. Aspiration PNA: Completed 7 day course of Unasyn on 1/12. Needs to be upright with pulmonary toilet.    Wean supplemental oxygen as  tolerated 9. CAF/CHB s/p PPM:   Monitor HR Off amiodarone and Entresto due to issues with hypotension.  11 COPD/OSA: Duonebs and incruse resumed. Encourage IS and keep HOB>30 degrees.   Communicated medication adjustments with  respiratory therapy 12. Depression/anxiety disorder: Emotional support 13. H/o migraines: Has been managed on Topamax and gabapentin.  14. Hypokalemia:   Kdur DC'd on 1/11  Potassium 4.3 on 1/14, labs ordered for tomorrow  Continue to monitor 15.  Hyperglycemia-secondary to tube feeds  Slightly elevated on 1/17  Continue SSI  Continue to monitor 16.  Leukocytosis: Resolved  WBCs 9.0 on 1/13  Afebrile  Continue to monitor 17.  Acute blood loss anemia  Hemoglobin 11.0 on 1/13, labs ordered for tomorrow  Continue to monitor 18.  Hypoalbuminemia  Adjust with tube feeds 19.  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  Loose stools  Discussed changing tube feeds with dietary  KUB reviewed, unremarkable  Improved after completion of abx 21.  Post stroke dysphagia  Continue tube feeds  Advance D1 pudding   Continue to advance as tolerated   Educated patient again  LOS: 11 days A FACE TO FACE EVALUATION WAS PERFORMED  Ankit Karis Jubanil Patel 05/24/2019, 9:15 AM

## 2019-05-25 ENCOUNTER — Inpatient Hospital Stay (HOSPITAL_COMMUNITY): Payer: Medicaid Other

## 2019-05-25 LAB — GLUCOSE, CAPILLARY
Glucose-Capillary: 115 mg/dL — ABNORMAL HIGH (ref 70–99)
Glucose-Capillary: 118 mg/dL — ABNORMAL HIGH (ref 70–99)
Glucose-Capillary: 120 mg/dL — ABNORMAL HIGH (ref 70–99)
Glucose-Capillary: 146 mg/dL — ABNORMAL HIGH (ref 70–99)
Glucose-Capillary: 81 mg/dL (ref 70–99)
Glucose-Capillary: 99 mg/dL (ref 70–99)

## 2019-05-25 LAB — BASIC METABOLIC PANEL
Anion gap: 11 (ref 5–15)
BUN: 19 mg/dL (ref 6–20)
CO2: 27 mmol/L (ref 22–32)
Calcium: 9.1 mg/dL (ref 8.9–10.3)
Chloride: 105 mmol/L (ref 98–111)
Creatinine, Ser: 0.85 mg/dL (ref 0.61–1.24)
GFR calc Af Amer: >60 mL/min (ref >60)
GFR calc non Af Amer: >60 mL/min (ref >60)
Glucose, Bld: 133 mg/dL — ABNORMAL HIGH (ref 70–99)
Potassium: 4.2 mmol/L (ref 3.5–5.1)
Sodium: 143 mmol/L (ref 135–145)

## 2019-05-25 LAB — CBC
HCT: 38 % — ABNORMAL LOW (ref 39.0–52.0)
Hemoglobin: 11.4 g/dL — ABNORMAL LOW (ref 13.0–17.0)
MCH: 28.6 pg (ref 26.0–34.0)
MCHC: 30 g/dL (ref 30.0–36.0)
MCV: 95.5 fL (ref 80.0–100.0)
Platelets: 505 10*3/uL — ABNORMAL HIGH (ref 150–400)
RBC: 3.98 MIL/uL — ABNORMAL LOW (ref 4.22–5.81)
RDW: 17.6 % — ABNORMAL HIGH (ref 11.5–15.5)
WBC: 8.6 10*3/uL (ref 4.0–10.5)
nRBC: 0 % (ref 0.0–0.2)

## 2019-05-25 LAB — PROTIME-INR
INR: 2.8 — ABNORMAL HIGH (ref 0.8–1.2)
Prothrombin Time: 29.2 seconds — ABNORMAL HIGH (ref 11.4–15.2)

## 2019-05-25 MED ORDER — WARFARIN SODIUM 7.5 MG PO TABS
7.5000 mg | ORAL_TABLET | Freq: Once | ORAL | Status: AC
  Administered 2019-05-25: 7.5 mg via ORAL
  Filled 2019-05-25: qty 1

## 2019-05-25 MED ORDER — VITAL 1.5 CAL PO LIQD
900.0000 mL | ORAL | Status: DC
  Administered 2019-05-25 – 2019-05-27 (×3): 900 mL
  Filled 2019-05-25 (×4): qty 948

## 2019-05-25 MED ORDER — PRO-STAT SUGAR FREE PO LIQD
30.0000 mL | Freq: Two times a day (BID) | ORAL | Status: DC
  Administered 2019-05-25 – 2019-06-08 (×27): 30 mL
  Filled 2019-05-25 (×28): qty 30

## 2019-05-25 NOTE — Progress Notes (Signed)
[place] PHYSICAL MEDICINE & REHABILITATION PROGRESS NOTE  Subjective/Complaints: Patient seen sitting up in bed this morning.  No reported issues overnight.  Patient remains severely dysarthric with increased rate of speech. Says he is ready to go home.    He is able to answer yes/no questions reliably: denies pain, constipation, nods yes to sleeping well at night. At end of visit states clearly "You have a great day too."  ROS: Appears to deny CP, shortness of breath, nausea, vomiting, diarrhea.  Objective: Vital Signs: Blood pressure 101/61, pulse (!) 55, temperature 97.8 F (36.6 C), resp. rate 17, height 5\' 11"  (1.803 m), weight 98.8 kg, SpO2 96 %. No results found. Recent Labs    05/25/19 0602  WBC 8.6  HGB 11.4*  HCT 38.0*  PLT 505*   Recent Labs    05/25/19 0602  NA 143  K 4.2  CL 105  CO2 27  GLUCOSE 133*  BUN 19  CREATININE 0.85  CALCIUM 9.1    Physical Exam: BP 101/61 (BP Location: Left Arm)   Pulse (!) 55   Temp 97.8 F (36.6 C)   Resp 17   Ht 5\' 11"  (1.803 m)   Wt 98.8 kg   SpO2 96%   BMI 30.38 kg/m  Constitutional: No distress . Vital signs reviewed.  Obese. HENT: Normocephalic.  Atraumatic.  + NG Eyes: EOMI. No discharge. Cardiovascular: No JVD. Respiratory: Normal effort.  No stridor. GI: Non-distended. Skin: Warm and dry.  Intact. Psych: Difficult to assess due to dysarthria.?  Anxious. Musc: Right upper extremity edema improving. Neurological: Alert Severe dysarthria, stable Able to follow simple motor commands.  Motor:  RUE: 4-4+/5 proximal distal, unchanged RLE: Hip flexion, knee extension 4 +/5, dorsiflexion 4+/5  Assessment/Plan: 1. 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  Physiatrist is providing close team supervision and 24 hour management of active medical problems listed  below.  Physiatrist and rehab team continue to assess barriers to discharge/monitor patient progress toward functional and medical goals  Care Tool:  Bathing    Body parts bathed by patient: Right arm, Abdomen, Right upper leg, Left upper leg, Face   Body parts bathed by helper: Left arm, Front perineal area, Buttocks, Left lower leg, Right lower leg     Bathing assist Assist Level: Maximal Assistance - Patient 24 - 49%     Upper Body Dressing/Undressing Upper body dressing   What is the patient wearing?: Pull over shirt    Upper body assist Assist Level: Moderate Assistance - Patient 50 - 74%    Lower Body Dressing/Undressing Lower body dressing      What is the patient wearing?: Underwear/pull up, Pants     Lower body assist Assist for lower body dressing: Minimal Assistance - Patient > 75%     Toileting Toileting    Toileting assist Assist for toileting: Minimal Assistance - Patient > 75%     Transfers Chair/bed transfer  Transfers assist     Chair/bed transfer assist level: Moderate Assistance - Patient 50 - 74%     Locomotion Ambulation   Ambulation assist   Ambulation activity did not occur: Safety/medical concerns(fatigue, LE weakness, restricted to threapy in room due to IV fluids)  Assist level: 2 helpers Assistive device: Walker-rolling Max distance: 110   Walk 10 feet activity   Assist  Walk 10 feet activity did not occur: Safety/medical concerns(fatigue, LE  weakness, restricted to threapy in room due to IV fluids)  Assist level: 2 helpers Assistive device: Walker-rolling   Walk 50 feet activity   Assist Walk 50 feet with 2 turns activity did not occur: Safety/medical concerns(fatigue, LE weakness, restricted to threapy in room due to IV fluids)  Assist level: 2 helpers Assistive device: Walker-rolling    Walk 150 feet activity   Assist Walk 150 feet activity did not occur: Safety/medical concerns(fatigue, LE weakness,  restricted to threapy in room due to IV fluids)         Walk 10 feet on uneven surface  activity   Assist Walk 10 feet on uneven surfaces activity did not occur: Safety/medical concerns(fatigue, LE weakness, restricted to threapy in room due to IV fluids)         Wheelchair     Assist Will patient use wheelchair at discharge?: Yes Type of Wheelchair: Manual Wheelchair activity did not occur: Safety/medical concerns(fatigue, R hemi, restricted to threapy in room due to IV fluids)         Wheelchair 50 feet with 2 turns activity    Assist    Wheelchair 50 feet with 2 turns activity did not occur: Safety/medical concerns(fatigue, R hemi, restricted to threapy in room due to IV fluids)       Wheelchair 150 feet activity     Assist Wheelchair 150 feet activity did not occur: Safety/medical concerns(fatigue, R hemi, restricted to threapy in room due to IV fluids)          Medical Problem List and Plan: 1.  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  Continue CIR 2.  Antithrombotics: -DVT/anticoagulation:  Pharmaceutical: Coumadin goal 2.5-3.5.   INR subtherapeutic on 1/17   INR 2.8 on 1/18.   -antiplatelet therapy: on ASA 3. Pain Management: tylenol prn 4. Mood: LCSW to follow for evaluation and support when appropriate.              -antipsychotic agents: N/A 5. Neuropsych: This patient is not fully capable of making decisions on his own behalf. 6. Skin/Wound Care: Routine pressure relief measures.  7. Fluids/Electrolytes/Nutrition: NPO. Continue tube feeds, added water flushes.  8. Aspiration PNA: Completed 7 day course of Unasyn on 1/12. Needs to be upright with pulmonary toilet.    Wean supplemental oxygen as tolerated 9. CAF/CHB s/p PPM:   Monitor HR Off amiodarone and Entresto due to issues with hypotension.  11 COPD/OSA: Duonebs  and incruse resumed. Encourage IS and keep HOB>30 degrees.   Communicated medication adjustments with respiratory therapy 12. Depression/anxiety disorder: Emotional support 13. H/o migraines: Has been managed on Topamax and gabapentin.  14. Hypokalemia:   Kdur DC'd on 1/11  Potassium 4.3 on 1/14  Stable on 1/18  Continue to monitor 15.  Hyperglycemia-secondary to tube feeds  Slightly elevated on 1/17, 1/18  Continue SSI  Continue to monitor 16.  Leukocytosis: Resolved  WBCs 9.0 on 1/13  Afebrile  Continue to monitor 17.  Acute blood loss anemia  Hemoglobin 11.0 on 1/13  11.4 on 1/18  Continue to monitor 18.  Hypoalbuminemia  Adjust with tube feeds 19.  Combined congestive heart failure  EF 25-30%  Daily weights  No sign of fluid overload at this time, continue to monitor Filed Weights   05/23/19 0500 05/24/19 0500 05/25/19 0423  Weight: 97.4 kg 97.4 kg 98.8 kg   Stable on 1/17  Increased on 1/18, asymptomatic,  continue to monitor.  20.  Loose stools  Discussed changing tube feeds with dietary  KUB reviewed, unremarkable  Improved after completion of abx 21.  Post stroke dysphagia  Continue tube feeds  Advance D1 pudding   Continue to advance as tolerated   Educated patient again  LOS: 12 days A FACE TO FACE EVALUATION WAS PERFORMED  Clint Bolder P Trayton Szabo 05/25/2019, 9:38 AM

## 2019-05-25 NOTE — Progress Notes (Signed)
Physical Therapy Session Note  Patient Details  Name: Jon [name] MRN: [id number] Date of Birth: 01/16/1966  Today's Date: 05/25/2019   Short Term Goals: Week 2:  PT Short Term Goal 1 (Week 2): Pt will perform bed mobility with CGA PT Short Term Goal 2 (Week 2): Pt will perform stand<>pivot transfer with LRAD min A PT Short Term Goal 3 (Week 2): Pt will perform car transfer with LRAD mod A  Session #1: PT Individual Time: 9528-4132 PT Individual Time Calculation (min): 45 min   Denies pain. Session focused on functional mobility, activity tolerance, safety with mobility, and self care activities including toileting and bathing/dressing at sink level. Pt performs bed mobility with supervision and functional transfers with RW at overall min assist level with max cues for safety and to decrease impulsivity. Extra time and cues for speech strategies to improve communication throughout session. Pt with low frustration tolerance noted. Pt ambulated into the bathroom with overall min assist but requires a second person to manage IV pole for O2 and tube feeds for safety due to impulsivity. Pt with +BM (RN made aware) and assisted pt with hygiene. At sink level performed bathing with incorporation of RUE into functional tasks with cues and extra time. Pt uses RUE as stabilizer when opening deodorant with assist by PT needed due to pt trying to hurry through activity. Donned shirt with extra time and cues and assisted with threading of pants for time management. Oral hygiene with mod assist using suction toothbrush kit and education as to why he cannot use regular toothbrush or have water from the sink due to aspiration risk and diet restrictions. Pt verbalized understanding but demonstrates poor carryover of information and continues to ask. End of session set up with safety belt donned and call bell/phone in reach.   Vital Signs: Oxygen Therapy SpO2: 96 % O2 Device: Nasal Cannula O2 Flow Rate  (L/min): 2-3 L/min  Session #2: 4401-0272 (54 min) Individual Time Denies pain. Upon entering room, pt with cup of water in hand drinking. Unsure how long or how much patient had. Notified RN and speech therapist seeing him today. Educated on importance of following diet restrictions and effects of aspiration. Pt verbalized understanding but expresses frustration with therapist "ratting" him out to RN. Focused on gait training, transfers, dynamic standing balance and functional use of RUE. Pt able to gait x 100' x 2 with turns with overall min assist with +2 for safety to manage IV pole and O2 with cues for increased R foot clearance, maintaining body position with RW especially during turns, and min assist for balance during turning when returning to chair. Pt ademant about not wanting therapist to hold onto him during gait or balance activities (had to modify balance activity to allow for LUE support to improve balance instead of attempting without UE support to challenge balance as intended. Educated on need for physical assist at this point and pt denies need for help but reluctantly agrees to different hand placement. Performed coordination and strength activities for RUE and then progressed to seated and standing balance for ball toss and hit with focus on utilizing RUE with overall min assist for balance and CGA when LUE supported. End of session transferred back to bed with min assist without AD and repositioned in supine with supervision. RN present to address tube feed needs.   Pt maintained session on 2L of O2 during activity with O2 = 94-99% and HR = 65-70 bpm. Denies SOB or difficulty  breathing with activity.   Therapy Documentation Precautions:  Precautions Precautions: Fall Precaution Comments: R hemi, dysarthria, dysphagia, on 2L O2 via [place], NG tube Restrictions Weight Bearing Restrictions: No    Therapy/Group: Individual Therapy  Jon [name], PT, DPT,  CBIS  05/25/2019, 10:07 AM

## 2019-05-25 NOTE — Progress Notes (Signed)
Speech Language Pathology Daily Session Note  Patient Details  Name: Jon [name] MRN: [id number] Date of Birth: 04-13-1966  Today's Date: 05/25/2019 SLP Individual Time: 1500-1530 ; 737-106 SLP Individual Time Calculation (min): 30 min and 45 min   Short Term Goals: Week 2: SLP Short Term Goal 1 (Week 2): Pt will consume least restrictive diet with minimal overt s/sx aspiration and efficient mastication and oral clearance with Min A verbal cues for use of swallow strategies. SLP Short Term Goal 2 (Week 2): Pt will demonstrate basic problem solving during functional tasks with Min A verbal/visual cues. SLP Short Term Goal 3 (Week 2): Pt will utilize speech intelligibility strategies (specifically pausing between words and overarticulation) to increase intelligibility to 60% at the word level with Mod A verbal cues.  Skilled Therapeutic Interventions:  1# Skilled ST services focused on swallow and speech skills. SLP provided oral care with suction toothbrush, piror to trials of thin liquid. Pt consumed x3 ice chips with immediate cough on 2 out 3 trials x 1 small cup sips thin with immediate cough and mod A verbal cues to utilize chin tuck strategy accurately. Pt required extensive time to clear sense aspiration on 2 out 3 episodes. Pt demonstrated reduced insight of swallow function impairments requesting more thin liquids, " give me a cup. Can do it." SLP provided education on impairment in swallow function and need for continuation of current diet as well as aspiration risk noted today at bedside and recent MBS.  Pt later consumed magic cup and pudding from breakfast tray, pt required physical assistance holding cup and min A verbal cues for consuming small bites. Pt required mod A verbal cues at phrase level for 60% intelligibility when communicating wants/needs.  Pt was left in room with call bell within reach and bed alarm set. ST recommends to continue skilled ST services.   As of note  PT informed SLP of pt consuming thin via cup at alone sink. PT notified nursing staff to remove all cups from reach.   2#  Skilled ST services focused on speech skills. SLP provided education of current impairment in swallow function, need for puree/pudding textures for safe PO intake and thin liquid trials only with SLP services. Pt expressed "no coughing" when consuming thin via cup alone. SLP provided education from most recent MBS indicating sensed/silent aspiration on thin. Pt agreed to not consume thin alone, however SLP suspects compliance could be an issue due to reduced insight. Pt requested to use the bathroom and refused offering of urinal, SLP notified nursing staff for assistance, however pt began urinating, SLP provided and assisted with urinal. SLP and NT assessed with change pants and shirt following urinary accident. SLP facilitated use of speech intelligibility strategies at phrase level given verb picture cards, pt required pt demonstrated 70% intelligibility with mod A verbal cues to produce 2-3 breaths per phrase in order to coordinate respiration/phonation as well as reducing rate. Pt demonstrated 60% intelligibility at phrase level with max A verbal cues for use of speech strategies, despite education of increase intelligibility with use of strategies in structured task verse unstructured.  Pt was left in room with call bell within reach and bed alarm set. ST recommends to continue skilled ST services.      Pain Pain Assessment Pain Score: 0-No pain  Therapy/Group: Individual Therapy  Banessa Mao  [facility] 05/25/2019, 3:47 PM

## 2019-05-25 NOTE — Progress Notes (Signed)
ANTICOAGULATION CONSULT NOTE  Pharmacy Consult for Warfarin Indication: stroke and mechanical valve, atrial fibrillation  Allergies  Allergen Reactions  . Hydrocodone-Acetaminophen Other (See Comments)    PT MOTHER DOES NOT REMEMBER REACTION  Only use in Emergency   . Hydromorphone Itching and Rash       . Other Other (See Comments)    ALL NARCOTICS-ITCHING, RASH   . Oxycodone Other (See Comments)    Unknown reaction  . Penicillins Rash    Did it involve swelling of the face/tongue/throat, SOB, or low BP? Unknown Did it involve sudden or severe rash/hives, skin peeling, or any reaction on the inside of your mouth or nose? Unknown Did you need to seek medical attention at a hospital or doctor's office? Unknown When did it last happen?unknown If all above answers are "NO", may proceed with cephalosporin use.  . Dilaudid [Hydromorphone Hcl] Itching and Rash    Patient Measurements: Total Body Weight: 97.2 kg Height: 71 inches  Vital Signs: Temp: 97.8 F (36.6 C) (01/18 0423) BP: 101/61 (01/18 0423) Pulse Rate: 55 (01/18 0423)  Labs: Recent Labs    05/23/19 0711 05/24/19 0734 05/25/19 0602  HGB  --   --  11.4*  HCT  --   --  38.0*  PLT  --   --  505*  LABPROT 27.0* 26.3* 29.2*  INR 2.5* 2.4* 2.8*  CREATININE  --   --  0.85    Estimated Creatinine Clearance: 120.4 mL/min (by C-G formula based on SCr of 0.85 mg/dL).   Assessment: 54 yr old male presented to the ED with stroke symptoms after recent discharge from [facility]. He is supposed to be on warfarin and Lovenox for history of a mechanical valve.  Patient is s/p thrombectomy and s/p bridging with Heparin to therapeutic INR with warfarin. Pharmacy consulted to dose.   Today, INR therapeutic at 2.8 after dose increase yesterday. CBC  stable at this time. No bleeding noted.   It is noted that the patient started a dysphagia I diet but only 20- 25% intake recorded since 1/15.  Goal of Therapy:   INR 2.5-3.5  Monitor platelets by anticoagulation protocol: Yes   Plan:  - Warfarin 7.5 mg x 1 dose at 1800 today - Daily PT/INR, CBC q72h - Will continue to monitor for any signs/symptoms of bleeding and will follow up with PT/INR in the a.m.     Dyson Sevey A. Jeanella Craze, PharmD, BCPS, FNKF Clinical Pharmacist [place] Please utilize Amion for appropriate phone number to reach the unit pharmacist [facility])    05/25/2019   10:53 AM

## 2019-05-25 NOTE — Progress Notes (Signed)
Nutrition Follow-up  RD working remotely.  DOCUMENTATION CODES:   Obesity unspecified  INTERVENTION:   Transition to nocturnal tube feeding regimen: - Vital 1.5 @ 90 ml/hr to run for 10 hours from 2000 to 0600 - Pro-stat 30 ml BID - Free water per MD, currently 100 ml QID  Nocturnal tube feeding regimen and current free water provides 1550 kcal, 91 grams of protein, and 1088 ml of H2O (67% kcal needs, 79% protein needs).  NUTRITION DIAGNOSIS:   Inadequate oral intake related to dysphagia as evidenced by NPO status.  Ongoing, being addressed via TF and diet advancement  GOAL:   Patient will meet greater than or equal to 90% of their needs  Progressing  MONITOR:   PO intake, Diet advancement, Labs, Weight trends, TF tolerance  REASON FOR ASSESSMENT:   Consult Enteral/tube feeding initiation and management  ASSESSMENT:   54 year old male with PMH of CAF, CHB-s/p PPM, migraines, aortic dissection s/p AVR complicated by CVA with residual dysarthria, gait disorder and visual deficits. Pt presented on 05/08/19 with sudden right-sided weakness and left gaze deviation. CT head showed abnormal hypodensity left colossal body and possible pericallosal gyrus. 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 CTA chest showed diffuse lung opacity concerning for PNA and dissection or aneurysm . He underwent cerebral angiogram with attempted thrombectomy of left A2 occlusion without significant improvement in flow and nonflow limiting dissection of distal cervical segment identified. Neurology felt the stroke was embolic in setting of subtherapeutic INR. Pt was placed on dysphagia 1 diet with honey-thick liquids due to dysphagia on 05/10/19. Chest x-ray performed on 05/12/19 suggestive of aspiration  pneumonia. Pt was made NPO and Cortrak placed for nutrition support. Pt admitted to CIR on 05/13/19.  01/15 - s/p MBSS with recommendations for Dysphagia 1 diet with honey-thick liquids  Cortrak remains in place.  RD will switch pt to nocturnal tube feeds to promote PO intake during the day now that pt's diet has been advanced.  Weight trending up since last RD assessment. Will continue to monitor trends.  Meal Completion: 25-100% x last 8 meals since diet advanced  Medications reviewed and include: SSI q 4 hours, protonix, warfarin  Labs reviewed. CBG's: 118-222 x 24 hours  Diet Order:   Diet Order            DIET - DYS 1 Room service appropriate? Yes; Fluid consistency: Pudding Thick  Diet effective now              EDUCATION NEEDS:   No education needs have been identified at this time  Skin:  Skin Assessment: Skin Integrity Issues: Incisions: right groin  Last BM:  05/25/19 large type 3  Height:   Ht Readings from Last 1 Encounters:  05/16/19 5\' 11"  (1.803 m)    Weight:   Wt Readings from Last 1 Encounters:  05/25/19 98.8 kg    Ideal Body Weight:  78.2 kg  BMI:  Body mass index is 30.38 kg/m.  Estimated Nutritional Needs:   Kcal:  2300-2500  Protein:  115-130 grams  Fluid:  >/= 2.0 L    05/27/19, MS, RD, LDN Inpatient Clinical Dietitian Pager: [phone] Weekend/After Hours: [phone]

## 2019-05-26 ENCOUNTER — Inpatient Hospital Stay (HOSPITAL_COMMUNITY): Payer: Medicaid Other

## 2019-05-26 ENCOUNTER — Inpatient Hospital Stay (HOSPITAL_COMMUNITY): Payer: Medicaid Other | Admitting: Speech Pathology

## 2019-05-26 ENCOUNTER — Inpatient Hospital Stay (HOSPITAL_COMMUNITY): Payer: Medicaid Other | Admitting: Occupational Therapy

## 2019-05-26 ENCOUNTER — Encounter (HOSPITAL_COMMUNITY): Payer: Medicaid Other | Admitting: Psychology

## 2019-05-26 DIAGNOSIS — F329 Major depressive disorder, single episode, unspecified: Secondary | ICD-10-CM

## 2019-05-26 LAB — GLUCOSE, CAPILLARY
Glucose-Capillary: 104 mg/dL — ABNORMAL HIGH (ref 70–99)
Glucose-Capillary: 121 mg/dL — ABNORMAL HIGH (ref 70–99)
Glucose-Capillary: 123 mg/dL — ABNORMAL HIGH (ref 70–99)
Glucose-Capillary: 83 mg/dL (ref 70–99)
Glucose-Capillary: 94 mg/dL (ref 70–99)
Glucose-Capillary: 96 mg/dL (ref 70–99)

## 2019-05-26 LAB — PROTIME-INR
INR: 3 — ABNORMAL HIGH (ref 0.8–1.2)
Prothrombin Time: 30.9 seconds — ABNORMAL HIGH (ref 11.4–15.2)

## 2019-05-26 MED ORDER — FUROSEMIDE 20 MG PO TABS
20.0000 mg | ORAL_TABLET | Freq: Every day | ORAL | Status: DC
  Administered 2019-05-26: 20 mg via ORAL
  Filled 2019-05-26: qty 1

## 2019-05-26 MED ORDER — WARFARIN SODIUM 7.5 MG PO TABS
7.5000 mg | ORAL_TABLET | Freq: Once | ORAL | Status: AC
  Administered 2019-05-26: 7.5 mg via ORAL
  Filled 2019-05-26: qty 1

## 2019-05-26 NOTE — Progress Notes (Signed)
Pt slept mostly through the night, Pt requested pudding and ice cream at beginning and end of night. Pt tolerated well eating pudding and magic ice cream. No signs of distress at this time. Call light in reach

## 2019-05-26 NOTE — Progress Notes (Signed)
Physical Therapy Session Note  Patient Details  Name: Jon [name] MRN: [id number] Date of Birth: [birth year]  Today's Date: 05/26/2019 PT Individual Time: 0215-0259 PT Individual Time Calculation (min): 44 min   Short Term Goals: Week 1:  PT Short Term Goal 1 (Week 1): Pt will perform bed mobility with min A PT Short Term Goal 1 - Progress (Week 1): Met PT Short Term Goal 2 (Week 1): Pt will transfer stand<>pivot with LRAD mod A PT Short Term Goal 2 - Progress (Week 1): Met PT Short Term Goal 3 (Week 1): Pt will perform car transfer mod A PT Short Term Goal 3 - Progress (Week 1): Progressing toward goal Week 2:  PT Short Term Goal 1 (Week 2): Pt will perform bed mobility with CGA PT Short Term Goal 2 (Week 2): Pt will perform stand<>pivot transfer with LRAD min A PT Short Term Goal 3 (Week 2): Pt will perform car transfer with LRAD mod A  Skilled Therapeutic Interventions/Progress Updates:   Received pt sitting in WC, pt agreeable to therapy, and did not report any pain during session. Pt on 2L O2 via [place] throughout session. Session focused on functional mobility/trasnfers, ambulation, stair navigation, LE strength, dynamic standing balance, and improved endurance with activity. Pt attempted WC mobility with bilateral UE's, however unsuccessful with RUE. Pt performed WC mobility 52f using bilateral LEs and LUE and supervision. Pt required verbal cues for technique. Pt ambulated [id number] and [id number] 1 with RW min A +2 for equipment. Pt required verbal cues for increased RLE step length, upright posture, and RW safety; however pt with poor carry over. Pt navigated 4 steps with 4 rails min A +2 for equipment ascending and descending with a step to pattern. O2 sat 97% and HR 67bpm after activity. Pt educated on WC parts management and demonstrated education using teach back and demonstration. Concluded session with pt sitting in recliner, needs within reach, and seatbelt alarm on. NT made aware  of pt status.   Therapy Documentation Precautions:  Precautions Precautions: Fall Precaution Comments: R hemi, dysarthria, dysphagia, on 4L O2 via [place], NG tube, and NPO Restrictions Weight Bearing Restrictions: No   Therapy/Group: Individual Therapy [name], DPT   05/26/2019, 7:38 AM

## 2019-05-26 NOTE — Progress Notes (Signed)
Occupational Therapy Session Note  Patient Details  Name: Jon [name] MRN: [id number] Date of Birth: 03/30/1966  Today's Date: 05/26/2019 OT Individual Time: 1914-7829 OT Individual Time Calculation (min): 60 min    Short Term Goals: Week 2:  OT Short Term Goal 1 (Week 2): Pt will transfer to the toilet with min A. OT Short Term Goal 2 (Week 2): Pt will don shoes with Mod assist OT Short Term Goal 3 (Week 2): Pt will complete bathing with min assist at sit > stand level  Skilled Therapeutic Interventions/Progress Updates:    Treatment session with focus on self-care retraining with sit > stand, stand pivot transfers, functional use of RUE, and awareness of impairments.  Pt received supine in bed agreeable to therapy session.  Completed bed mobility with supervision and stand pivot transfer to w/c min assist.  Stand pivot w/c > tub bench in room shower with min assist.  Pt attempted to incorporate RUE into bathing, however dropping cloth 50% of time ultimately requiring hand over hand assist to maintain hold on wash cloth.  Continues to demonstrate ataxia in RUE with decreased sensation and decreased awareness as frequently continuing on with task despite having dropped cloth or hairbrush.  Pt completed sit > stand with supervision during bathing, assist to wash buttocks.  Pt required increased assistance with donning pants this session with inability to maintain grasp on pants with Rt hand to pull pants over hips.  Completed oral care with multimodal cues due to swallowing precautions.  Returned to bed min assist stand pivot and left in bed with all needs in reach. O2 sats dropped to 87% on 2L during shower, however returned to 94% on 1.5L while dressing and up to 98% on 1.5L at end of session at rest.    Pt with increased intelligibility this session, still requiring mod cues to follow strategies.  Therapy Documentation Precautions:  Precautions Precautions: Fall Precaution Comments:  R hemi, dysarthria, dysphagia, on 4L O2 via [place], NG tube, and NPO Restrictions Weight Bearing Restrictions: No General:   Vital Signs: Oxygen Therapy O2 Device: Nasal Cannula O2 Flow Rate (L/min): 1.5 L/min Pain:  Pt with no c/o pain   Therapy/Group: Individual Therapy  Rosalio Loud 05/26/2019, 9:16 AM

## 2019-05-26 NOTE — Progress Notes (Signed)
Speech Language Pathology Daily Session Note  Patient Details  Name: Jon [name] MRN: [id number] Date of Birth: [birth year]  Today's Date: 05/26/2019 SLP Individual Time: 0092-3300 SLP Individual Time Calculation (min): 44 min  Short Term Goals: Week 2: SLP Short Term Goal 1 (Week 2): Pt will consume least restrictive diet with minimal overt s/sx aspiration and efficient mastication and oral clearance with Min A verbal cues for use of swallow strategies. SLP Short Term Goal 2 (Week 2): Pt will demonstrate basic problem solving during functional tasks with Min A verbal/visual cues. SLP Short Term Goal 3 (Week 2): Pt will utilize speech intelligibility strategies (specifically pausing between words and overarticulation) to increase intelligibility to 60% at the word level with Mod A verbal cues.  Skilled Therapeutic Interventions: Pt was seen for skilled ST targeting dysphagia and speech goals. Pt's mother was present for first ~10 minutes of session and answered questions regarding thickener/rationale behind thin trials. Following thorough oral care via suction completed by pt with Supervision A verbal cues for thoroughness, pt accepted trials of thin H2O via teaspoon. Pt required Mod A verbal cue reminders to implement chin tuck during intake and exhibited strong reflexive cough response in ~50% trials. Recommend continue current diet. During speech tasks with topics unfamiliar to SLP, pt was ~60-65% intelligible at phrase level with Mod A verbal cues for repeating messages with slow rate/pausing between words. Pt left sitting in wheelchair with alarm set. Continue per current plan of care.          Pain Pain Assessment Pain Scale: 0-10 Pain Score: 0-No pain  Therapy/Group: Individual Therapy  [name] 05/26/2019, 2:14 PM

## 2019-05-26 NOTE — Progress Notes (Signed)
[facility] PHYSICAL MEDICINE & REHABILITATION PROGRESS NOTE  Subjective/Complaints: Patient seen laying in bed this morning.  He states he indicates he slept well overnight.  No reported issues overnight.  ROS: Denies CP, shortness of breath, nausea, vomiting, diarrhea.  Objective: Vital Signs: Blood pressure 114/69, pulse (!) 59, temperature 97.6 F (36.4 C), resp. rate 16, height 5\' 11"  (1.803 m), weight 99.5 kg, SpO2 97 %. No results found. Recent Labs    05/25/19 0602  WBC 8.6  HGB 11.4*  HCT 38.0*  PLT 505*   Recent Labs    05/25/19 0602  NA 143  K 4.2  CL 105  CO2 27  GLUCOSE 133*  BUN 19  CREATININE 0.85  CALCIUM 9.1    Physical Exam: BP 114/69 (BP Location: Left Arm)   Pulse (!) 59   Temp 97.6 F (36.4 C)   Resp 16   Ht 5\' 11"  (1.803 m)   Wt 99.5 kg   SpO2 97%   BMI 30.59 kg/m  Constitutional: No distress . Vital signs reviewed.  Obese. HENT: Normocephalic.  Atraumatic.  + NG. Eyes: EOMI. No discharge. Cardiovascular: No JVD. Respiratory: Normal effort.  No stridor. GI: Non-distended. Skin: Warm and dry.  Intact. Psych: Limited due to dysarthria Musc: No edema in extremities.  No tenderness in extremities. Neurological: Alert Severe dysarthria, unchanged Able to follow simple motor commands.  Motor:  RUE: 4-4+/5 proximal distal, stable RLE: Hip flexion, knee extension 4 +/5, dorsiflexion 4+/5  Assessment/Plan: 1. 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  Physiatrist is providing close team supervision and 24 hour management of active medical problems listed below.  Physiatrist and rehab team continue to assess barriers to discharge/monitor patient progress toward functional and medical goals  Care Tool:  Bathing    Body parts bathed by patient: Right arm, Abdomen, Right upper leg, Left upper leg, Face    Body parts bathed by helper: Buttocks     Bathing assist Assist Level: Maximal Assistance - Patient 24 - 49%     Upper Body Dressing/Undressing Upper body dressing   What is the patient wearing?: Pull over shirt    Upper body assist Assist Level: Minimal Assistance - Patient > 75%    Lower Body Dressing/Undressing Lower body dressing      What is the patient wearing?: Pants     Lower body assist Assist for lower body dressing: Minimal Assistance - Patient > 75%     Toileting Toileting    Toileting assist Assist for toileting: Minimal Assistance - Patient > 75%     Transfers Chair/bed transfer  Transfers assist     Chair/bed transfer assist level: Minimal Assistance - Patient > 75%     Locomotion Ambulation   Ambulation assist   Ambulation activity did not occur: Safety/medical concerns(fatigue, LE weakness, restricted to threapy in room due to IV fluids)  Assist level: 2 helpers(min assist with +2 for O2/IV pole management) Assistive device: Walker-rolling Max distance: 100'   Walk 10 feet activity   Assist  Walk 10 feet activity did not occur: Safety/medical concerns(fatigue, LE weakness, restricted to threapy in room due to IV fluids)  Assist level: 2 helpers Assistive device: Walker-rolling   Walk 50 feet activity   Assist Walk 50 feet with 2 turns activity did not occur: Safety/medical concerns(fatigue, LE weakness, restricted to threapy in room due to IV fluids)  Assist  level: 2 helpers Assistive device: Walker-rolling    Walk 150 feet activity   Assist Walk 150 feet activity did not occur: Safety/medical concerns(fatigue, LE weakness, restricted to threapy in room due to IV fluids)         Walk 10 feet on uneven surface  activity   Assist Walk 10 feet on uneven surfaces activity did not occur: Safety/medical concerns(fatigue, LE weakness, restricted to threapy in room due to IV fluids)         Wheelchair     Assist Will  patient use wheelchair at discharge?: Yes Type of Wheelchair: Manual Wheelchair activity did not occur: Safety/medical concerns(fatigue, R hemi, restricted to threapy in room due to IV fluids)         Wheelchair 50 feet with 2 turns activity    Assist    Wheelchair 50 feet with 2 turns activity did not occur: Safety/medical concerns(fatigue, R hemi, restricted to threapy in room due to IV fluids)       Wheelchair 150 feet activity     Assist Wheelchair 150 feet activity did not occur: Safety/medical concerns(fatigue, R hemi, restricted to threapy in room due to IV fluids)          Medical Problem List and Plan: 1.  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  Continue CIR 2.  Antithrombotics: -DVT/anticoagulation:  Pharmaceutical: Coumadin goal 2.5-3.5.   INR therapeutic on 1/19  -antiplatelet therapy: on ASA 3. Pain Management: tylenol prn 4. Mood: LCSW to follow for evaluation and support when appropriate.              -antipsychotic agents: N/A 5. Neuropsych: This patient is not fully capable of making decisions on his own behalf. 6. Skin/Wound Care: Routine pressure relief measures.  7. Fluids/Electrolytes/Nutrition: Monitor electrolytes 8. Aspiration PNA: Completed 7 day course of Unasyn on 1/12. Needs to be upright with pulmonary toilet.    Wean supplemental oxygen as tolerated 9. CAF/CHB s/p PPM:   Monitor HR Off amiodarone and Entresto due to issues with hypotension.  11 COPD/OSA: Duonebs and incruse resumed. Encourage IS and keep HOB>30 degrees.   Communicated medication adjustments with respiratory therapy 12. Depression/anxiety disorder: Emotional support 13. H/o migraines: Has been managed on Topamax and gabapentin.  14. Hypokalemia:   Kdur DC'd on 1/11  Potassium 4.2 on 1/18  Continue to monitor 15.  Hyperglycemia-secondary to tube  feeds  Relatively controlled on 1/19  Continue SSI  Continue to monitor 16.  Leukocytosis: Resolved  WBCs 9.0 on 1/13  Afebrile  Continue to monitor 17.  Acute blood loss anemia  Hemoglobin 11.4 on 1/18  Continue to monitor 18.  Hypoalbuminemia  Adjust with tube feeds 19.  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  Loose stools  Discussed changing tube feeds with dietary  KUB reviewed, unremarkable  Improved after completion of abx 21.  Post stroke dysphagia  Continue tube feeds  Advance D1 pudding  Continue to advance as tolerated   Educated patient again  LOS: 13 days A FACE TO FACE EVALUATION WAS PERFORMED  Shyanna Klingel Lorie Phenix 05/26/2019, 8:16 AM

## 2019-05-26 NOTE — Consult Note (Signed)
Neuropsychological Consultation   Patient:   Jon [name]   DOB:   14-Nov-1965  MR Number:  [id number]  Location:  [facility] [address] CENTER A [id number] [place] [address] Dept: [phone] Loc: [phone]           Date of Service:   05/26/2019  Start Time:   3 PM End Time:   4 PM  Provider/Observer:  Arley Phenix, Psy.D.       Clinical Neuropsychologist       Billing Code/Service: [id number]  Chief Complaint:    Jon [name]. Jon [name] is a 54 year old male with history of CHF, CHB, migraines, aortic dissection complicated by CVA with residual dysarthria, gait disorder and visual deficits.  The patient was previously seen at the inpatient comprehensive rehabilitation unit in 2014 after a CVA due to aortic stenosis and embolic stroke.  He has had continued ongoing difficulties and ongoing issues related to A. fib, heart valve issues etc.  The patient presented on 05/08/2019 with sudden right-sided weakness and left gaze deviation.  Head CT showed abnormal hypodensity left colossal body and possible pericallosal gyrus.  High-grade stenosis noted in the CCA and abrupt occlusion of proximal A2 left ACA with reconstitution distally and extensive airspace disease in left apex suspicious for pneumonia.  Follow-up MRI showed multifocal restricted diffusion involving left frontoparietal lobes in both ACA and MCA territories as well as involvement of corpus callosum minor involvement of the left occipital lobe with question of watershed and contralateral involvement of right arterial lateral thalamus and right centrum ovale.    Reason for Service:  HPI: Jon [name] is a 54 year old male with history of CAF, CHB- s/p PPM, migraines, aortic dissection s/p AVR complicated by CVA with residual dysarthria, gait disorder and visual deficits (can't see below his waist per mother).  He presented on 05/08/2019 with sudden  right-sided weakness and left gaze deviation.  History taken from chart review due to severe dysarthria.  CT head showed abnormal hypodensity left colossal body and possible pericallosal gyrus.  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  CTA chest showed diffuse lung opacity concerning for PNA and dissection or aneurysm .  He underwent cerebral angiogram with attempted thrombectomy of left A2 occlusion without significant improvement in flow and nonflow limiting dissection of distal cervical segment identified.  Coumadin was subtherapeutic at admission -- multiple attempts made to contact family for input and the reported patient had been recently discharged from hospital on Lovenox twice daily, however however mother had not been administering injections.  Echocardiogram showed ejection fraction of 25-30%.  He was placed on IV heparin and neurology felt the stroke was embolic in setting of subtherapeutic INR.  He was placed on dysphagia 1 honey liquid diet with chin tuck due to dysphagia with aspiration of nectars on 01/03.  Follow-up MRI showed multifocal restricted diffusion involving left frontoparietal lobes in both ACA and MCA territory as well as involvement of corpus callosum minor involvement of left occipital lobe question watershed and contralateral involvement of right anterolateral thalamus and right centrum ovale. He has had issues with hypotension requiring levophed off and on.  He had progressive leukocytosis with increasing cough and chest x-ray performed on 05/12/2019 personally reviewed, suggestive of aspiration pneumonia.  He was  made n.p.o. with cortak for nutritional support. Therapy ongoing and patient showing ability to follow simple commands with expressive aphasia, dysarthria and poor  safety awareness. CIR recommended due to functional decline.  Please see preadmission assessment from earlier today as well.  Current Status:  The patient continues to have significant severe expressive language deficits.  Many of the symptoms are pre-existing.  They are exacerbated by ongoing motor deficits and more recent stroke.  The patient has the appearance of some confusion but does acknowledge a great deal of frustration about another stroke and concerns about how he was managing his symptoms and medications prior to this most recent stroke.  The patient acknowledges a progressive worsening because of his recurrent strokes.  Behavioral Observation: Jon [name]  presents as a 54 y.o.-year-old Right Caucasian Male who appeared his stated age. his dress was Appropriate and he was Well Groomed and his manners were inappropriate to the situation.  his participation was indicative of Intrusive and Impulsive behaviors.  There were any physical disabilities noted.  he displayed an appropriate level of cooperation and motivation.     Interactions:    Active Inattentive and Redirectable  Attention:   abnormal and attention span appeared shorter than expected for age  Memory:   abnormal; remote memory intact, recent memory impaired  Visuo-spatial:  not examined  Speech (Volume):  normal  Speech:   non-fluent aphasia; slurred  Thought Process:  Coherent and Tangential  Though Content:  WNL; not suicidal and not homicidal  Orientation:   person and place  Judgment:   Poor  Planning:   Poor  Affect:    Irritable  Mood:    Dysphoric  Insight:   Fair  Intelligence:   normal  Medical History:   Past Medical History:  Diagnosis Date  . Aneurysm of thoracic aorta ([place]) 05/06/2012   IMO routine update IMO routine update  . Anxiety    sees Dr. Wende Neighbors  . Aortic aneurysm and dissection ([place]) 05/06/2012  . Aortic stenosis    mechanical AVR 04/22/12  . Aortic valve disorder  05/06/2012  . 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 Bicuspid aortic valve   . Cerebral infarction ([place]) 05/11/2012  . Cerebral thrombosis with cerebral infarction ([place]) 07/14/2013  . Cholelithiasis 12/20/2016  . Current use of long term anticoagulation 12/20/2016  . CVA (cerebral infarction)    occipital CVA 12/13  . Dissecting aortic aneurysm ([place]) 05/06/2012  . Dysarthria 09/10/2017  . Dysrhythmia    sees Dr. Leslie Dales, [facility] cardiology in [place]  . Generalized constriction of visual field 10/15/2012   IMOUPDATE IMOUPDATE  . 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 GERD (gastroesophageal reflux disease)   . HCAP (healthcare-associated pneumonia) 05/06/2012  . Headache(784.0)    "due to vision problem"  . History of CVA (cerebrovascular accident) 05/07/2012  . Hypothyroidism   . Idiopathic peripheral neuropathy 07/14/2013  . Late effects of CVA (cerebrovascular accident) 08/08/2015  . Migraine   . Nontraumatic multiple localized intracerebral hemorrhages ([place]) 10/16/2017  . Paroxysmal atrial fibrillation ([place]) 08/08/2015   On warfarin On warfarin  . Presence of prosthetic heart valve 08/08/2015  . Shortness of breath   . Stroke ([place])    x3  . Stroke, embolic ([place]) 4/0/3474  . Syncope 05/06/2012  . Thoracic aortic aneurysm ([place])    replacement aortic root 04/22/12  . VBI (vertebrobasilar insufficiency) 09/10/2017     Psychiatric History:  Prior history of anxiety but no indication  of history of depressive disorder.  Family Med/Psych History:  Family History  Problem Relation Age of Onset  . Lung disease Mother   . Diabetes Father   . Peripheral Artery Disease Father   . Hypertension Father   . CAD Father   . Heart disease Father   . Stroke Father   . Cancer Maternal Grandfather   . Heart disease Paternal Grandfather     Risk of Suicide/Violence: low patient has significant physical and cognitive impairments.  Impression/DX:  Venia Carbon. Gurney is a 54 year old male with history of CHF, CHB, migraines, aortic dissection complicated by CVA with residual dysarthria, gait disorder and visual deficits.  The patient was previously seen at the inpatient comprehensive rehabilitation unit in 2014 after a CVA due to aortic stenosis and embolic stroke.  He has had continued ongoing difficulties and ongoing issues related to A. fib, heart valve issues etc.  The patient presented on 05/08/2019 with sudden right-sided weakness and left gaze deviation.  Head CT showed abnormal hypodensity left colossal body and possible pericallosal gyrus.  High-grade stenosis noted in the CCA and abrupt occlusion of proximal A2 left ACA with reconstitution distally and extensive airspace disease in left apex suspicious for pneumonia.  Follow-up MRI showed multifocal restricted diffusion involving left frontoparietal lobes in both ACA and MCA territories as well as involvement of corpus callosum minor involvement of the left occipital lobe with question of watershed and contralateral involvement of right arterial lateral thalamus and right centrum ovale.   The patient continues to have significant severe expressive language deficits.  Many of the symptoms are pre-existing.  They are exacerbated by ongoing motor deficits and more recent stroke.  The patient has the appearance of some confusion but does acknowledge a great deal of frustration about another stroke and concerns about how he was managing his symptoms and medications prior to this most recent stroke.  The patient acknowledges a progressive worsening because of his recurrent strokes.  Disposition/Plan:  Due to the patient's significant severe expressive language deficits there are significant barriers to efficient and effective communication.  However, the patient was able to express his frustration.  He denied any severe depression but more of agitation and irritation to acute loss of function on top of his  pre-existing cognitive difficulties.  The patient reports that he has tried to manage his medical status for some time and is difficult for him to keep up with all of the interventions needed for his significant cardiovascular and cerebrovascular history.  Diagnosis:    Reactive depression secondary to recent stroke and aspiration pneumonia.        Electronically Signed   _______________________ Arley Phenix, Psy.D.

## 2019-05-26 NOTE — Progress Notes (Signed)
ANTICOAGULATION CONSULT NOTE  Pharmacy Consult for Warfarin Indication: stroke and mechanical valve, atrial fibrillation  Allergies  Allergen Reactions  . Hydrocodone-Acetaminophen Other (See Comments)    PT MOTHER DOES NOT REMEMBER REACTION  Only use in Emergency   . Hydromorphone Itching and Rash       . Other Other (See Comments)    ALL NARCOTICS-ITCHING, RASH   . Oxycodone Other (See Comments)    Unknown reaction  . Penicillins Rash    Did it involve swelling of the face/tongue/throat, SOB, or low BP? Unknown Did it involve sudden or severe rash/hives, skin peeling, or any reaction on the inside of your mouth or nose? Unknown Did you need to seek medical attention at a hospital or doctor's office? Unknown When did it last happen?unknown If all above answers are "NO", may proceed with cephalosporin use.  . Dilaudid [Hydromorphone Hcl] Itching and Rash    Patient Measurements: Total Body Weight: 97.2 kg Height: 71 inches  Vital Signs:    Labs: Recent Labs    05/24/19 0734 05/25/19 0602 05/26/19 0745  HGB  --  11.4*  --   HCT  --  38.0*  --   PLT  --  505*  --   LABPROT 26.3* 29.2* 30.9*  INR 2.4* 2.8* 3.0*  CREATININE  --  0.85  --     Estimated Creatinine Clearance: 120.8 mL/min (by C-G formula based on SCr of 0.85 mg/dL).   Assessment: 54 yr old male presented to the ED with stroke symptoms after recent discharge from [facility]. He is supposed to be on warfarin and Lovenox for history of a mechanical valve.  Patient is s/p thrombectomy and s/p bridging with Heparin to therapeutic INR with warfarin. Pharmacy consulted to dose.   Today, INR therapeutic at 3 after dose decrease yesterday. CBC  stable at this time. No bleeding noted.     Goal of Therapy:  INR 2.5-3.5  Monitor platelets by anticoagulation protocol: Yes   Plan:  - Warfarin 7.5 mg x 1 dose at 1800 today - Daily PT/INR, CBC q72h - Will continue to monitor for any  signs/symptoms of bleeding and will follow up with PT/INR in the a.m.     Andrew Blasius A. Jeanella Craze, PharmD, BCPS, FNKF Clinical Pharmacist [place] Please utilize Amion for appropriate phone number to reach the unit pharmacist [facility])    05/26/2019   10:15 AM

## 2019-05-26 NOTE — Progress Notes (Signed)
Occupational Therapy Session Note  Patient Details  Name: Jon [name] MRN: [id number] Date of Birth: 08-15-1965  Today's Date: 05/26/2019 OT Individual Time: 1104-1200 OT Individual Time Calculation (min): 56 min   Skilled Therapeutic Interventions/Progress Updates:    Pt greeted semi-reclined in bed with mother present and agreeable to OT treatment session. Pt reported need to go to the bathroom. Pt completed bed mobility with min A. Pt donned shoes at EOB with mod A and cues to not reach impulsively as pthad LOB to the R at EOB. Pt then completed stand-pivot to wc, then to regular toilet using grab bars. Pt able to manage clothing with CGA for balance. Pt voided bladder and had sucessful BM. Pt able to complete peri-care with set-up A and CGA for balance when reaching with L hand. Pt perseverative on being clean, but per mother, stated this is normal for him. BUE handwashing task completed at the sink with set-up A. Pt brought down to therapy gym and worked on R attention and functional use of R UE with dynavision activity. OT had pt work on finger isolation and hand-eye coordination to hit light buttons. B UE strengthening with 10 mins on [employer] arm bike on level 2. Pt needed cues to maintain grip with R hand on handles 50% of the time. Pt returned to room at end of session and left seated in wc with alarm belt on, mother present, call bell in reach, and needs met.   Therapy Documentation Precautions:  Precautions Precautions: Fall Precaution Comments: R hemi, dysarthria, dysphagia, on 4L O2 via [place], NG tube, and NPO Restrictions Weight Bearing Restrictions: No Pain:  denies pain   Therapy/Group: Individual Therapy  Valma Cava 05/26/2019, 11:38 AM

## 2019-05-27 ENCOUNTER — Inpatient Hospital Stay (HOSPITAL_COMMUNITY): Payer: Medicaid Other | Admitting: Occupational Therapy

## 2019-05-27 ENCOUNTER — Inpatient Hospital Stay (HOSPITAL_COMMUNITY): Payer: Medicaid Other | Admitting: Speech Pathology

## 2019-05-27 ENCOUNTER — Inpatient Hospital Stay (HOSPITAL_COMMUNITY): Payer: Medicaid Other

## 2019-05-27 LAB — GLUCOSE, CAPILLARY
Glucose-Capillary: 108 mg/dL — ABNORMAL HIGH (ref 70–99)
Glucose-Capillary: 112 mg/dL — ABNORMAL HIGH (ref 70–99)
Glucose-Capillary: 119 mg/dL — ABNORMAL HIGH (ref 70–99)
Glucose-Capillary: 123 mg/dL — ABNORMAL HIGH (ref 70–99)
Glucose-Capillary: 123 mg/dL — ABNORMAL HIGH (ref 70–99)
Glucose-Capillary: 83 mg/dL (ref 70–99)
Glucose-Capillary: 95 mg/dL (ref 70–99)

## 2019-05-27 LAB — PROTIME-INR
INR: 2.9 — ABNORMAL HIGH (ref 0.8–1.2)
Prothrombin Time: 30.4 seconds — ABNORMAL HIGH (ref 11.4–15.2)

## 2019-05-27 MED ORDER — WARFARIN SODIUM 7.5 MG PO TABS
7.5000 mg | ORAL_TABLET | Freq: Once | ORAL | Status: AC
  Administered 2019-05-27: 18:00:00 7.5 mg via ORAL
  Filled 2019-05-27: qty 1

## 2019-05-27 MED ORDER — RESOURCE THICKENUP CLEAR PO POWD
ORAL | Status: DC | PRN
  Filled 2019-05-27: qty 125

## 2019-05-27 MED ORDER — FUROSEMIDE 40 MG PO TABS
40.0000 mg | ORAL_TABLET | Freq: Every day | ORAL | Status: DC
  Administered 2019-05-27 – 2019-05-30 (×4): 40 mg via ORAL
  Filled 2019-05-27 (×4): qty 1

## 2019-05-27 NOTE — Progress Notes (Signed)
Occupational Therapy Session Note  Patient Details  Name: Jon [name] MRN: [id number] Date of Birth: 07-08-1965  Today's Date: 05/27/2019 OT Individual Time: 1700-1749 OT Individual Time Calculation (min): 42 min    Short Term Goals: Week 2:  OT Short Term Goal 1 (Week 2): Pt will transfer to the toilet with min A. OT Short Term Goal 2 (Week 2): Pt will don shoes with Mod assist OT Short Term Goal 3 (Week 2): Pt will complete bathing with min assist at sit > stand level  Skilled Therapeutic Interventions/Progress Updates:    Treatment session with focus on dynamic standing balance and functional use of dominant RUE.  Pt received supine in bed agreeable to therapy session.  Pt donned clean shorts at bed level with bridging.  Pt requiring increased time and effort when utilizing RUE to pull pants over Rt hip.  Completed stand pivot transfer CGA.  Engaged in dynamic standing activity incorporating reaching across midline and outside BOS to challenge balance while incorporating reaching with RUE.  Pt demonstrating difficulty when reaching to obtain items, requiring increased time and effort but ultimately able to pick up items.  Increased difficulty with manipulation, ultimately requiring assistance from opposite hand to complete task.  Pt required encouragement to maintain standing for duration of task as attempting to sit prematurely.  Returned to room and transferred back to bed with CGA.  Pt doffed pants at bed level for comfort.  Pt left with RN arriving.  Therapy Documentation Precautions:  Precautions Precautions: Fall Precaution Comments: R hemi, dysarthria, dysphagia, on 4L O2 via [place], NG tube, and NPO Restrictions Weight Bearing Restrictions: No General:   Vital Signs: Therapy Vitals Temp: (!) 97.5 F (36.4 C) Pulse Rate: 64 Resp: 20 BP: 113/76 Patient Position (if appropriate): Lying Oxygen Therapy SpO2: 100 % O2 Device: Nasal Cannula O2 Flow Rate (L/min): 1.5  L/min Pain: Pain Assessment Pain Scale: 0-10 Pain Score: 0-No pain   Therapy/Group: Individual Therapy  Rosalio Loud 05/27/2019, 3:42 PM

## 2019-05-27 NOTE — Progress Notes (Signed)
[place] PHYSICAL MEDICINE & REHABILITATION PROGRESS NOTE  Subjective/Complaints: Patient seen laying in bed this AM.  He indicates he slept well overnight.  Speech is slightly clearer.   ROS: Denies CP, shortness of breath, nausea, vomiting, diarrhea.  Objective: Vital Signs: Blood pressure 115/66, pulse (!) 58, temperature 97.6 F (36.4 C), resp. rate 18, height 5\' 11"  (1.803 m), weight 100.5 kg, SpO2 98 %. No results found. Recent Labs    05/25/19 0602  WBC 8.6  HGB 11.4*  HCT 38.0*  PLT 505*   Recent Labs    05/25/19 0602  NA 143  K 4.2  CL 105  CO2 27  GLUCOSE 133*  BUN 19  CREATININE 0.85  CALCIUM 9.1    Physical Exam: BP 115/66   Pulse (!) 58   Temp 97.6 F (36.4 C)   Resp 18   Ht 5\' 11"  (1.803 m)   Wt 100.5 kg   SpO2 98%   BMI 30.90 kg/m  Constitutional: No distress . Vital signs reviewed. Obese.  HENT: Normocephalic.  Atraumatic. +NG. Eyes: EOMI. No discharge. Cardiovascular: No JVD. Respiratory: Normal effort.  No stridor. GI: Non-distended. Skin: Warm and dry.  Intact. Psych: Limited due to dysarthria. Musc: No edema in extremities.  No tenderness in extremities. Neurological: Alert. Severe dysarthria, stable Able to follow simple motor commands.  Motor:  RUE: 4-4+/5 proximal distal, stable/improving RLE: Hip flexion, knee extension 4 +/5, dorsiflexion 4+/5  Assessment/Plan: 1. 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  Physiatrist is providing close team supervision and 24 hour management of active medical problems listed below.  Physiatrist and rehab team continue to assess barriers to discharge/monitor patient progress toward functional and medical goals  Care Tool:  Bathing    Body parts bathed by patient: Right arm, Abdomen, Right upper leg, Left upper leg, Face, Chest, Front perineal area    Body parts bathed by helper: Buttocks, Left arm     Bathing assist Assist Level: Minimal Assistance - Patient > 75%     Upper Body Dressing/Undressing Upper body dressing   What is the patient wearing?: Pull over shirt    Upper body assist Assist Level: Minimal Assistance - Patient > 75%    Lower Body Dressing/Undressing Lower body dressing      What is the patient wearing?: Pants     Lower body assist Assist for lower body dressing: Moderate Assistance - Patient 50 - 74%     Toileting Toileting    Toileting assist Assist for toileting: Minimal Assistance - Patient > 75%     Transfers Chair/bed transfer  Transfers assist     Chair/bed transfer assist level: Minimal Assistance - Patient > 75%     Locomotion Ambulation   Ambulation assist   Ambulation activity did not occur: Safety/medical concerns(fatigue, LE weakness, restricted to threapy in room due to IV fluids)  Assist level: 2 helpers Assistive device: Walker-rolling Max distance: 150   Walk 10 feet activity   Assist  Walk 10 feet activity did not occur: Safety/medical concerns(fatigue, LE weakness, restricted to threapy in room due to IV fluids)  Assist level: 2 helpers Assistive device: Walker-rolling   Walk 50 feet activity   Assist Walk 50 feet with 2 turns activity did not occur: Safety/medical concerns(fatigue, LE weakness, restricted to threapy in room due to IV fluids)  Assist level: 2 helpers Assistive device: 05/27/19  Walk 150 feet activity   Assist Walk 150 feet activity did not occur: Safety/medical concerns(fatigue, LE weakness, restricted to threapy in room due to IV fluids)  Assist level: 2 helpers Assistive device: Walker-rolling    Walk 10 feet on uneven surface  activity   Assist Walk 10 feet on uneven surfaces activity did not occur: Safety/medical concerns(fatigue, LE weakness, restricted to threapy in room due to IV fluids)          Wheelchair     Assist Will patient use wheelchair at discharge?: Yes Type of Wheelchair: Manual Wheelchair activity did not occur: Safety/medical concerns(fatigue, R hemi, restricted to threapy in room due to IV fluids)  Wheelchair assist level: Supervision/Verbal cueing Max wheelchair distance: 24ft    Wheelchair 50 feet with 2 turns activity    Assist    Wheelchair 50 feet with 2 turns activity did not occur: Safety/medical concerns(fatigue, R hemi, restricted to threapy in room due to IV fluids)   Assist Level: Supervision/Verbal cueing   Wheelchair 150 feet activity     Assist Wheelchair 150 feet activity did not occur: Safety/medical concerns(fatigue, R hemi, restricted to threapy in room due to IV fluids)          Medical Problem List and Plan: 1.  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  Continue CIR  Team conference today to discuss current and goals and coordination of care, home and environmental barriers, and discharge planning with nursing, case manager, and therapies.  2.  Antithrombotics: -DVT/anticoagulation:  Pharmaceutical: Coumadin goal 2.5-3.5.   INR therapeutic on 1/20  -antiplatelet therapy: on ASA 3. Pain Management: tylenol prn 4. Mood: LCSW to follow for evaluation and support when appropriate.              -antipsychotic agents: N/A 5. Neuropsych: This patient is not fully capable of making decisions on his own behalf. 6. Skin/Wound Care: Routine pressure relief measures.  7. Fluids/Electrolytes/Nutrition: Monitor electrolytes 8. Aspiration PNA: Completed 7 day course of Unasyn on 1/12. Needs to be upright with pulmonary toilet.    Wean supplemental oxygen as tolerated 9. CAF/CHB s/p PPM:   Monitor HR Off amiodarone and Entresto due to issues with hypotension.  11 COPD/OSA: Duonebs and incruse resumed. Encourage  IS and keep HOB>30 degrees.   Communicated medication adjustments with respiratory therapy 12. Depression/anxiety disorder: Emotional support 13. H/o migraines: Has been managed on Topamax and gabapentin.  14. Hypokalemia:   Kdur DC'd on 1/11  Potassium 4.2 on 1/18  Continue to monitor 15.  Hyperglycemia-secondary to tube feeds  Relatively controlled on 1/20  Continue SSI  Continue to monitor 16.  Leukocytosis: Resolved  WBCs 9.0 on 1/13  Afebrile  Continue to monitor 17.  Acute blood loss anemia  Hemoglobin 11.4 on 1/18  Continue to monitor 18.  Hypoalbuminemia  Adjust with tube feeds 19.  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  Loose stools  Discussed changing tube feeds with dietary  KUB reviewed, unremarkable  Improved after completion of abx 21.  Post stroke dysphagia  Continue tube feeds  Advance D1 pudding  Continue to advance as tolerated  Educated patient again  LOS: 14 days A FACE TO FACE EVALUATION WAS PERFORMED  Leathie Weich Lorie Phenix 05/27/2019, 8:18 AM

## 2019-05-27 NOTE — Progress Notes (Signed)
Speech Language Pathology Daily Session Note  Patient Details  Name: Jon [name] MRN: [id number] Date of Birth: 1965/06/28  Today's Date: 05/27/2019 SLP Individual Time: 7829-5621 SLP Individual Time Calculation (min): 44 min  Short Term Goals: Week 2: SLP Short Term Goal 1 (Week 2): Pt will consume least restrictive diet with minimal overt s/sx aspiration and efficient mastication and oral clearance with Min A verbal cues for use of swallow strategies. SLP Short Term Goal 2 (Week 2): Pt will demonstrate basic problem solving during functional tasks with Min A verbal/visual cues. SLP Short Term Goal 3 (Week 2): Pt will utilize speech intelligibility strategies (specifically pausing between words and overarticulation) to increase intelligibility to 60% at the word level with Mod A verbal cues.  Skilled Therapeutic Interventions: Pt was seen for skilled ST targeting dysphagia and cognitive goals. SLP facilitated session with a familiar basic money management task (ALFA), which pt demonstrated some improvements in accuracy in ability to add change, however Mod A verbal and visual cues still required for organization and error awareness throughout task. Pt able to recall chin tuck as swallow strategy when participating in thin H2O trials with SLP, however consistent Mod A verbal cues still required for him to implement it during intake (following thorough oral care via suction). Pt exhibited immediate cough in 3 out of 4 trials of thin via teaspoon. Discussed severe aspiration risk with pt and availability of pudding thick liquids throughout day. Spoke with RN regarding thickening lemonade, tea, or gingerale to pudding thick. Pt left laying in bed with alarm set and needs within reach. Continue per current plan of care.       Pain Pain Assessment Pain Scale: 0-10 Pain Score: 0-No pain   Therapy/Group: Individual Therapy  Jon [name] 05/27/2019, 3:21 PM

## 2019-05-27 NOTE — Progress Notes (Signed)
Occupational Therapy Session Note  Patient Details  Name: Jon [name] MRN: [id number] Date of Birth: Sep 05, 1965  Today's Date: 05/27/2019 OT Individual Time: 1020-1130 OT Individual Time Calculation (min): 70 min    Short Term Goals: Week 2:  OT Short Term Goal 1 (Week 2): Pt will transfer to the toilet with min A. OT Short Term Goal 2 (Week 2): Pt will don shoes with Mod assist OT Short Term Goal 3 (Week 2): Pt will complete bathing with min assist at sit > stand level  Skilled Therapeutic Interventions/Progress Updates:    Treatment session with focus on functional transfers, dynamic standing balance, and functional use of dominant RUE during self-care tasks.  Pt received in bed reporting need to urgently urinate.  Provided pt with urinal to which pt was able to void bladder.  Completed bed mobility Supervision and stand pivot transfer bed > w/c with CGA due to impulsivity.  Completed w/c > tub bench transfer with mod assist as pt impulsively reaching for bench and then having to transfer around self (instead of 90* transfer).  Completed bathing with hand over hand assist to incorporate dominant RUE into bathing.  Pt dropping cloth 50% of time and often continuing to wash without awareness of having dropped cloth.  O2 sats remained in mid to high 90's on 1.5 L throughout session.  Engaged in RUE NMR in sitting with use of resistive clothespins.  Pt able to utilize least resistive clothespins and place and remove them from dowel, but unable to increase pinch to next resistance level. Pt reporting hand fatigue and requesting to terminate task.  Transferred back to bed with min assist and returned to semi-reclined position with supervision.  Therapy Documentation Precautions:  Precautions Precautions: Fall Precaution Comments: R hemi, dysarthria, dysphagia, on 4L O2 via [place], NG tube, and NPO Restrictions Weight Bearing Restrictions: No General:   Vital Signs: Oxygen Therapy SpO2: 98  % O2 Device: Nasal Cannula O2 Flow Rate (L/min): 1.5 L/min Pain: Pain Assessment Pain Scale: Faces Pain Type: Chronic pain Pain Location: Head Pain Descriptors / Indicators: Headache Pain Frequency: Intermittent Pain Intervention(s): Medication (See eMAR)   Therapy/Group: Individual Therapy  Rosalio Loud 05/27/2019, 12:04 PM

## 2019-05-27 NOTE — Progress Notes (Signed)
Physical Therapy Weekly Progress Note  Patient Details  Name: Jon [name] MRN: [id number] Date of Birth: 23-Mar-1966  Beginning of progress report period: May 14, 2019 End of progress report period: May 27, 2019  Today's Date: 05/27/2019 PT Individual Time: 4403-4742 PT Individual Time Calculation (min): 44 min   Patient has met 3 of 3 short term goals.  Pt demonstrates improvements in functional mobility/transfers, ambulation, stair navigation, LE strength, dynamic standing balance, and endurance. Pt currently requires CGA/supervision for bed mobility, min A for transfers with RW, mod A to navigate 8 steps with 2 rails, supervision for WC mobility 12f, and ambulating 1569fwith RW min A +2 for safety/equipment. Pt also able to tolerate therapy on 1L O2 via [place] with O2 sat ranging from 92-99%. However, pt continues to demonstrate difficulty with R UE/LE motor planning, dynamic standing balance, safety awareness with mobility, dysarthria, dysphagia, and fatigue. Pt with continued impulsivity with mobility.   Patient continues to demonstrate the following deficits muscle weakness, impaired timing and sequencing, unbalanced muscle activation, decreased coordination and decreased motor planning and decreased standing balance, decreased postural control, hemiplegia and decreased balance strategies and therefore will continue to benefit from skilled PT intervention to increase functional independence with mobility.  Patient progressing toward long term goals..  Continue plan of care.  PT Short Term Goals Week 2:  PT Short Term Goal 1 (Week 2): Pt will perform bed mobility with CGA PT Short Term Goal 1 - Progress (Week 2): Met PT Short Term Goal 2 (Week 2): Pt will perform stand<>pivot transfer with LRAD min A PT Short Term Goal 2 - Progress (Week 2): Met PT Short Term Goal 3 (Week 2): Pt will perform car transfer with LRAD mod A PT Short Term Goal 3 - Progress (Week 2): Met Week 3:   PT Short Term Goal 1 (Week 3): Pt will perform stand<>pivot with LRAD CGA PT Short Term Goal 2 (Week 3): Pt will perform car transfer with LRAD min A PT Short Term Goal 3 (Week 3): Pt will ambulate [id number] LRAD min A  Skilled Therapeutic Interventions/Progress Updates:  Ambulation/gait training;Discharge planning;Functional mobility training;Therapeutic Activities;Balance/vestibular training;Disease management/prevention;Neuromuscular re-education;Therapeutic Exercise;Wheelchair propulsion/positioning;Cognitive remediation/compensation;DME/adaptive equipment instruction;Pain management;Splinting/orthotics;UE/LE Strength taining/ROM;Community reintegration;Functional electrical stimulation;Patient/family education;Stair training;UE/LE Coordination activities   Today's Interventions: Received pt standing pulling pants up with NT, therapist took over with care. Pt agreeable to therapy, and denied any pain throughout session. Session focused on functional mobility/transfers, simulated car transfers, ambulation, stair navigation, LE strength, dynamic standing balance/coordination/motor planning, and improved endurance with activity. Pt on 2L O2 decreasing to 1L via [place] throughout session. Pt continues to remain impulsive with all mobility and demonstrates poor safety awareness. Pt performed WC mobility 100f11fth supervision using bilateral LE's. Pt performed car transfer without AD mod A with verbal cues for technique and safety. Pt ambulated [id number] RW min A +2 for equipment/safety to therapy gym. Pt continues to require verbal cues for increased R LE step length, RW safety, and upright posture. O2 sat 95% increasing to 99% on 2L. Therapist decreased O2 sat to 1L. Pt navigated 8 steps with 2 rails ascending and descending with a step to pattern mod A with verbal cues for foot placement on step and step sequence; however pt with poor carry over. O2 sat 92% increasing to 99% with seated rest break on 1L O2.  Pt educated on WC parts management and demonstrated donning/doffing legrests and locking/unlocking brakes with supervision and verbal cues. Pt transported back to  gym in [facility] total assist. Concluded session with pt supine in bed, needs within reach, and bed alarm on.   Therapy Documentation Precautions:  Precautions Precautions: Fall Precaution Comments: R hemi, dysarthria, dysphagia, on 4L O2 via [place], NG tube, and NPO Restrictions Weight Bearing Restrictions: No  Therapy/Group: Individual Therapy Alfonse Alpers PT, DPT   05/27/2019, 7:47 AM

## 2019-05-27 NOTE — Progress Notes (Signed)
ANTICOAGULATION CONSULT NOTE  Pharmacy Consult for Warfarin Indication: stroke and mechanical valve, atrial fibrillation  Allergies  Allergen Reactions  . Hydrocodone-Acetaminophen Other (See Comments)    PT MOTHER DOES NOT REMEMBER REACTION  Only use in Emergency   . Hydromorphone Itching and Rash       . Other Other (See Comments)    ALL NARCOTICS-ITCHING, RASH   . Oxycodone Other (See Comments)    Unknown reaction  . Penicillins Rash    Did it involve swelling of the face/tongue/throat, SOB, or low BP? Unknown Did it involve sudden or severe rash/hives, skin peeling, or any reaction on the inside of your mouth or nose? Unknown Did you need to seek medical attention at a hospital or doctor's office? Unknown When did it last happen?unknown If all above answers are "NO", may proceed with cephalosporin use.  . Dilaudid [Hydromorphone Hcl] Itching and Rash    Patient Measurements: Total Body Weight: 97.2 kg Height: 71 inches  Vital Signs: Temp: 97.6 F (36.4 C) (01/20 0425) BP: 115/66 (01/20 0425) Pulse Rate: 58 (01/20 0425)  Labs: Recent Labs    05/25/19 0602 05/26/19 0745 05/27/19 0531  HGB 11.4*  --   --   HCT 38.0*  --   --   PLT 505*  --   --   LABPROT 29.2* 30.9* 30.4*  INR 2.8* 3.0* 2.9*  CREATININE 0.85  --   --     Estimated Creatinine Clearance: 121.4 mL/min (by C-G formula based on SCr of 0.85 mg/dL).   Assessment: 54 yr old male presented to the ED with stroke symptoms after recent discharge from [facility]. He is supposed to be on warfarin and Lovenox for history of a mechanical valve.  Patient is s/p thrombectomy and s/p bridging with Heparin to therapeutic INR with warfarin. Pharmacy consulted to dose.   Today, INR therapeutic at 2.9. Last CBC stable. No bleeding noted.     Goal of Therapy:  INR 2.5-3.5  Monitor platelets by anticoagulation protocol: Yes   Plan:  - Warfarin 7.5 mg x 1 dose at 1800 today - Daily PT/INR, CBC  q72h - Will continue to monitor for any signs/symptoms of bleeding and will follow up with PT/INR in the a.m.     Tiphany Fayson A. Jeanella Craze, PharmD, BCPS, FNKF Clinical Pharmacist [place] Please utilize Amion for appropriate phone number to reach the unit pharmacist [facility])    05/27/2019   10:51 AM

## 2019-05-27 NOTE — Patient Care Conference (Signed)
Inpatient RehabilitationTeam Conference and Plan of Care Update Date: 05/27/2019   Time: 11:30 AM    Patient Name: Jon [name]      Medical Record Number: [id number]  Date of Birth: 04-05-1966 Sex: Male         Room/Bed: 4W05C/4W05C-01 Payor Info: Payor: MEDICAID [place] / Plan: MEDICAID [place] ACCESS / Product Type: *No Product type* /    Admit Date/Time:  05/13/2019  5:12 PM  Primary Diagnosis:  Acute ischemic stroke [facility])  Patient Active Problem List   Diagnosis Date Noted  . Reactive depression   . Supplemental oxygen dependent   . Labile blood glucose   . Chronic combined systolic and diastolic congestive heart failure (HCC)   . Loose stools   . Acute on chronic combined systolic and diastolic congestive heart failure (HCC)   . Hypoalbuminemia due to protein-calorie malnutrition (HCC)   . Hyperglycemia   . Subtherapeutic international normalized ratio (INR)   . Embolic stroke (HCC) 05/13/2019  . Dysphagia, post-stroke   . Migraine without status migrainosus, not intractable   . Anxiety and depression   . Chronic obstructive pulmonary disease (HCC)   . OSA (obstructive sleep apnea)   . Chronic atrial fibrillation (HCC)   . Hypokalemia   . Essential hypertension 05/12/2019  . Hyperlipidemia 05/12/2019  . Diabetes mellitus type II, controlled (HCC) 05/12/2019  . Dysphagia 05/12/2019  . Aspiration pneumonia (HCC) 05/12/2019  . Leukocytosis 05/12/2019  . Cigarette smoker 05/12/2019  . Obesity 05/12/2019  . Family hx-stroke 05/12/2019  . Acute blood loss anemia 05/12/2019  . Stroke (cerebrum) (HCC) 05/08/2019  . Acute ischemic stroke (HCC) L MCA s/p attempted IR, embolic d/t PAF, low EF and subtherapeutic AC 05/08/2019  . Status post aortic valve replacement 02/23/2019  . 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 Erectile dysfunction 12/10/2017  . Nontraumatic multiple localized intracerebral hemorrhages (HCC) 10/16/2017  . Dysarthria  09/10/2017  . VBI (vertebrobasilar insufficiency) 09/10/2017  . Cholelithiasis 12/20/2016  . Current use of long term anticoagulation 12/20/2016  . Late effects of CVA (cerebrovascular accident) 08/08/2015  . Paroxysmal atrial fibrillation (HCC) 08/08/2015  . Presence of prosthetic heart valve 08/08/2015  . 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 Idiopathic peripheral neuropathy 07/14/2013  . Cerebral thrombosis with cerebral infarction (HCC) 07/14/2013  . Generalized constriction of visual field 10/15/2012  . Seroma, postoperative 06/13/2012  . CVA (cerebral infarction) 05/11/2012  . Cerebral infarction (HCC) 05/11/2012  . Stroke, embolic (HCC) 05/07/2012  . History of CVA (cerebrovascular accident) 05/07/2012  . HCAP (healthcare-associated pneumonia) 05/06/2012  . Syncope 05/06/2012  . Anxiety 05/06/2012  . Aortic stenosis 05/06/2012  . Aortic aneurysm and dissection (HCC) 05/06/2012  . Thoracic aortic aneurysm (HCC) 05/06/2012  . Aortic valve disorder 05/06/2012  . Dissecting aortic aneurysm (HCC) 05/06/2012  . Aneurysm of thoracic aorta (HCC) 05/06/2012    Expected Discharge Date: Expected Discharge Date: 06/09/19  Team Members Present: Physician leading conference: Dr. Maryla Morrow Social Worker Present: Amada Jupiter, LCSW Nurse Present: Chana Bode, RN;Hilary Curlene Labrum, RN Case Manager: Roderic Palau, RN PT Present: Raechel Chute, PT OT Present: Rosalio Loud, OT SLP Present: Suzzette Righter, CF-SLP PPS Coordinator present : Fae Pippin, SLP     Current Status/Progress Goal Weekly Team Focus  Bowel/Bladder   Pt is continent of bladder and bowel, occasionally having incontinence of bowel  No incontinence episodes  Toileting every 2 hours/ PRN  Swallow/Nutrition/ Hydration   Dys 1/pudding thick liquid, severe oral and pharyngeal phase deficits, risk for dehydration and severe aspiration risk   Supervision A least restrictive diet  Thin liquid trials via teaspoon with chin tuck, tolerance Dys 1   ADL's   Min assist stand pivot transfers, Min assist bathing and UB dressing, Mod assist LB dressing at sit > stand level  Supervision  ADL retarining, RUE NMR, standing balance, safety awareness, awareness of deficits, reinforcement of communication strategies   Mobility   bed mobility CGA, transfers min A, ambulation 173ft RW min A +2, 4 steps 2 rails min A +2  supervision transfers, CGA gait and stairs  functional mobility/transfers, ambulation, stair navigation, LE strength, balance/cooridnation, endurance.   Communication   Mod-Max A, ~60% intelligible phrase level  Min A  carryover speech strategies phrase level (pacing/pausing and breathing in between words, overarticulation)   Safety/Cognition/ Behavioral Observations  Min A problem solving, Min-Mod impulsivity  Supervision A  Basic problem solving and reducing impulsiveness   Pain   Pt denies pain during this time  Remain pain free  Assess pain every shift/ PRN   Skin   Pt has ecchymosis to BUE and abdomen, abrasion to right arm  Prevent further skin breakdown  Assess skin every shift/ PRN      *See Care Plan and progress notes for long and short-term goals.     Barriers to Discharge  Current Status/Progress Possible Resolutions Date Resolved   Nursing                  PT     dysarthria and impulsive              OT                  SLP                SW                Discharge Planning/Teaching Needs:  Pt to return home with mother as primary caregiver and other family (brother/ sis-in-law) to provide 24/7 assistance.  Ready to begin training   Team Discussion: Weight trending up, diuretic increased.  RN inc at times, tylenol for headache.  OT yelling for urinal, cont with urinal, min a transfers, impulsive, showered min a, RUE ataxic.  PT min A walker 150', min +2 safety, 4 steps min/mod, stair goals and amb CGA.   Wants to DC home early.  Get sister in law and mother in for training.  SLP MBS last week, impaired, D1pudding thick, trials of thins, no progress, needs cues to chin tuck, dysarthria, comm 1 word at a time, goals for comm S/Min A.   Revisions to Treatment Plan: N/A     Medical Summary Current Status: Expressive aphasia, right hemiparesis, dysarthria, poor safety awareness, limitations in self-care, dysphagia secondary to bilateral CVA, left greater than right Weekly Focus/Goal: Improve mobility, dysphagia, CHF  Barriers to Discharge: Medical stability;Weight;Nutrition means   Possible Resolutions to Barriers: Therapies, diuretics, follow labs, adance diet as tolerated   Continued Need for Acute Rehabilitation Level of Care: The patient requires daily medical management by a physician with specialized training in physical medicine and rehabilitation for the following reasons: Direction of a multidisciplinary physical rehabilitation program to maximize functional independence : Yes Medical management of patient stability for increased activity during participation in an intensive rehabilitation regime.: Yes Analysis of laboratory values and/or radiology reports with any subsequent need  for medication adjustment and/or medical intervention. : Yes   I attest that I was present, lead the team conference, and concur with the assessment and plan of the team.   Retta Diones 05/27/2019, 9:10 PM   Team conference was held via web/ teleconference due to [place] - 19

## 2019-05-28 ENCOUNTER — Inpatient Hospital Stay (HOSPITAL_COMMUNITY): Payer: Medicaid Other | Admitting: Occupational Therapy

## 2019-05-28 ENCOUNTER — Inpatient Hospital Stay (HOSPITAL_COMMUNITY): Payer: Medicaid Other

## 2019-05-28 ENCOUNTER — Inpatient Hospital Stay (HOSPITAL_COMMUNITY): Payer: Medicaid Other | Admitting: Speech Pathology

## 2019-05-28 LAB — GLUCOSE, CAPILLARY
Glucose-Capillary: 142 mg/dL — ABNORMAL HIGH (ref 70–99)
Glucose-Capillary: 143 mg/dL — ABNORMAL HIGH (ref 70–99)
Glucose-Capillary: 89 mg/dL (ref 70–99)
Glucose-Capillary: 94 mg/dL (ref 70–99)
Glucose-Capillary: 105 mg/dL — ABNORMAL HIGH (ref 70–99)

## 2019-05-28 LAB — PROTIME-INR
INR: 2.8 — ABNORMAL HIGH (ref 0.8–1.2)
Prothrombin Time: 29.8 seconds — ABNORMAL HIGH (ref 11.4–15.2)

## 2019-05-28 MED ORDER — WARFARIN SODIUM 7.5 MG PO TABS
7.5000 mg | ORAL_TABLET | Freq: Once | ORAL | Status: AC
  Administered 2019-05-28: 7.5 mg via ORAL
  Filled 2019-05-28: qty 1

## 2019-05-28 MED ORDER — VITAL 1.5 CAL PO LIQD
750.0000 mL | ORAL | Status: DC
  Administered 2019-05-28 – 2019-06-03 (×7): 750 mL
  Filled 2019-05-28 (×7): qty 948

## 2019-05-28 NOTE — Progress Notes (Signed)
[place] PHYSICAL MEDICINE & REHABILITATION PROGRESS NOTE  Subjective/Complaints: Patient seen laying in bed this morning.  He states he slept well overnight.  No reported issues overnight.  Decreased rate of speech and therefore more intelligible today.  He has questions about his discharge date.  ROS: Denies CP, shortness of breath, nausea, vomiting, diarrhea.  Objective: Vital Signs: Blood pressure 130/75, pulse 63, temperature 98.4 F (36.9 C), resp. rate 18, height 5\' 11"  (1.803 m), weight 100.3 kg, SpO2 98 %. No results found. No results for input(s): WBC, HGB, HCT, PLT in the last 72 hours. No results for input(s): NA, K, CL, CO2, GLUCOSE, BUN, CREATININE, CALCIUM in the last 72 hours.  Physical Exam: BP 130/75   Pulse 63   Temp 98.4 F (36.9 C)   Resp 18   Ht 5\' 11"  (1.803 m)   Wt 100.3 kg   SpO2 98%   BMI 30.84 kg/m  Constitutional: No distress . Vital signs reviewed.  Obese. HENT: Normocephalic.  Atraumatic.  + NG. Eyes: EOMI. No discharge. Cardiovascular: No JVD. Respiratory: Normal effort.  No stridor. GI: Non-distended. Skin: Warm and dry.  Intact. Psych: Normal mood.  Normal behavior. Musc: No edema in extremities.  No tenderness in extremities. Neurological: Alert Severe dysarthria, improving Able to follow simple motor commands.  Motor:  RUE: 4-4+/5 proximal distal, stable RLE: Hip flexion, knee extension 4 +/5, dorsiflexion 4+/5  Assessment/Plan: 1. 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  Physiatrist is providing close team supervision and 24 hour management of active medical problems listed below.  Physiatrist and rehab team continue to assess barriers to discharge/monitor patient progress toward functional and medical goals  Care Tool:  Bathing    Body parts bathed by patient: Right arm, Abdomen, Right upper  leg, Left upper leg, Face, Chest, Front perineal area, Buttocks, Right lower leg, Left lower leg   Body parts bathed by helper: Left arm     Bathing assist Assist Level: Minimal Assistance - Patient > 75%     Upper Body Dressing/Undressing Upper body dressing   What is the patient wearing?: Pull over shirt    Upper body assist Assist Level: Minimal Assistance - Patient > 75%    Lower Body Dressing/Undressing Lower body dressing      What is the patient wearing?: Pants, Underwear/pull up     Lower body assist Assist for lower body dressing: Minimal Assistance - Patient > 75%     Toileting Toileting    Toileting assist Assist for toileting: Minimal Assistance - Patient > 75%     Transfers Chair/bed transfer  Transfers assist     Chair/bed transfer assist level: Minimal Assistance - Patient > 75%     Locomotion Ambulation   Ambulation assist   Ambulation activity did not occur: Safety/medical concerns(fatigue, LE weakness, restricted to threapy in room due to IV fluids)  Assist level: 2 helpers Assistive device: Walker-rolling Max distance: 150   Walk 10 feet activity   Assist  Walk 10 feet activity did not occur: Safety/medical concerns(fatigue, LE weakness, restricted to threapy in room due to IV fluids)  Assist level: 2 helpers Assistive device: Walker-rolling   Walk 50 feet activity   Assist Walk 50 feet with 2 turns activity did not occur: Safety/medical concerns(fatigue, LE weakness, restricted to threapy in room due to IV fluids)  Assist level: 2 helpers Assistive device:  Walk 150 feet activity   Assist Walk 150 feet activity did not occur: Safety/medical concerns(fatigue, LE weakness, restricted to threapy in room due to IV fluids)  Assist level: 2 helpers Assistive device: Walker-rolling    Walk 10 feet on uneven surface  activity   Assist Walk 10 feet on uneven surfaces activity did not occur: Safety/medical  concerns(fatigue, LE weakness, restricted to threapy in room due to IV fluids)         Wheelchair     Assist Will patient use wheelchair at discharge?: Yes Type of Wheelchair: Manual Wheelchair activity did not occur: Safety/medical concerns(fatigue, R hemi, restricted to threapy in room due to IV fluids)  Wheelchair assist level: Supervision/Verbal cueing Max wheelchair distance: 162ft    Wheelchair 50 feet with 2 turns activity    Assist    Wheelchair 50 feet with 2 turns activity did not occur: Safety/medical concerns(fatigue, R hemi, restricted to threapy in room due to IV fluids)   Assist Level: Supervision/Verbal cueing   Wheelchair 150 feet activity     Assist Wheelchair 150 feet activity did not occur: Safety/medical concerns(fatigue, R hemi, restricted to threapy in room due to IV fluids)          Medical Problem List and Plan: 1.  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  Continue CIR 2.  Antithrombotics: -DVT/anticoagulation:  Pharmaceutical: Coumadin goal 2.5-3.5.   INR therapeutic on 1/21  -antiplatelet therapy: on ASA 3. Pain Management: tylenol prn 4. Mood: LCSW to follow for evaluation and support when appropriate.              -antipsychotic agents: N/A 5. Neuropsych: This patient is not fully capable of making decisions on his own behalf. 6. Skin/Wound Care: Routine pressure relief measures.  7. Fluids/Electrolytes/Nutrition: Monitor electrolytes 8. Aspiration PNA: Completed 7 day course of Unasyn on 1/12. Needs to be upright with pulmonary toilet.    Wean supplemental oxygen as tolerated 9. CAF/CHB s/p PPM:   Monitor HR Off amiodarone and Entresto due to issues with hypotension.  11 COPD/OSA: Duonebs and incruse resumed. Encourage IS and keep HOB>30 degrees.   Communicated medication adjustments with respiratory  therapy 12. Depression/anxiety disorder: Emotional support 13. H/o migraines: Has been managed on Topamax and gabapentin.  14. Hypokalemia:   Kdur DC'd on 1/11  Potassium 4.2 on 1/18  Continue to monitor 15.  Hyperglycemia-secondary to tube feeds  Labile and?  Trending up on 1/21, monitor for trend  Continue SSI  Continue to monitor 16.  Leukocytosis: Resolved  WBCs 9.0 on 1/13  Afebrile  Continue to monitor 17.  Acute blood loss anemia  Hemoglobin 11.4 on 1/18  Continue to monitor 18.  Hypoalbuminemia  Adjust with tube feeds 19.  Combined congestive heart failure  EF 25-30%  Daily weights  No sign of fluid overload at this time, continue to monitor Filed Weights   05/26/19 0446 05/27/19 0417 05/28/19 0500  Weight: 99.5 kg 100.5 kg 100.3 kg   Lasix 20 daily started on 1/19, increased on 1/20  Stable on 1/21 20.  Loose stools  Discussed changing tube feeds with dietary  KUB reviewed, unremarkable  Improved after completion of abx 21.  Post stroke dysphagia  Continue tube feeds  Advance D1 pudding  Continue to advance as tolerated   Educated patient again  LOS: 15 days A FACE TO FACE EVALUATION WAS PERFORMED  Ankit Lorie Phenix 05/28/2019, 8:11  AM

## 2019-05-28 NOTE — Progress Notes (Signed)
Speech Language Pathology Weekly Progress and Session Note  Patient Details  Name: Jon [name] MRN: [id number] Date of Birth: 07-22-1965  Beginning of progress report period: May 21, 2019 End of progress report period: May 28, 2019  Today's Date: 05/28/2019 SLP Individual Time: 0109-3235 SLP Individual Time Calculation (min): 44 min  Short Term Goals: Week 2: SLP Short Term Goal 1 (Week 2): Pt will consume least restrictive diet with minimal overt s/sx aspiration and efficient mastication and oral clearance with Min A verbal cues for use of swallow strategies. SLP Short Term Goal 1 - Progress (Week 2): Met SLP Short Term Goal 2 (Week 2): Pt will demonstrate basic problem solving during functional tasks with Min A verbal/visual cues. SLP Short Term Goal 2 - Progress (Week 2): Progressing toward goal SLP Short Term Goal 3 (Week 2): Pt will utilize speech intelligibility strategies (specifically pausing between words and overarticulation) to increase intelligibility to 60% at the word level with Mod A verbal cues. SLP Short Term Goal 3 - Progress (Week 2): Met    New Short Term Goals: Week 3: SLP Short Term Goal 1 (Week 3): Pt will consume current diet with minimal overt s/sx aspiration and Supervision A for use of swallow strategies. SLP Short Term Goal 2 (Week 3): Pt wil consume upgraded trials Dys 2 X2 with efficient mastication and oral clearance X3 prior to solid upgrade. SLP Short Term Goal 3 (Week 3): During therapeutic trials of thin via teaspoon or small ice chips, pt will implement chin tuck strategy with Min A verbal cues. SLP Short Term Goal 4 (Week 3): Pt will utilize speech intelligibility strategies (specifically pausing between words and overarticulation) to increase intelligibility to 70% at the word level with Min A verbal cues. SLP Short Term Goal 5 (Week 3): Pt will demonstrate basic problem solving during functional tasks with Min A verbal/visual  cues.  Weekly Progress Updates: Pt has made functional gains and met 3 out of 4 short term goals (4th is not listed above because it was previously marked as met when pt participated in MBSS to assess oropharyngeal swallow function). Pt is currently Mod assist for due to serve dysarthria impacting speech intelligibility and functional communication at the phrase level as well as cognitive impairments marked by problem solving and attention deficits, as well as impulsivity. Pt is consuming Dys 1 diet with pudding thick liquids, which was recommended after MBSS on 05/22/19. Pt is consuming ice chips/thin trials via teaspoon after oral care and with use of chin tuck only with SLP but continues to present with relatively frequent overt s/sx aspiration and is not appropriate for upgrade or repeat MBSS yet. Pt has demonstrated improved use of speech intelligibility strategies. Pt and family education is ongoing. Pt would continue to benefit from skilled ST while inpatient in order to maximize functional independence and reduce burden of care prior to discharge. Anticipate that pt will need 24/7 supervision at discharge in addition to [place] follow up at next level of care.      Intensity: Minumum of 1-2 x/day, 30 to 90 minutes Frequency: 3 to 5 out of 7 days Duration/Length of Stay: 06/09/19 Treatment/Interventions: Cognitive remediation/compensation;Internal/external aids;Speech/Language facilitation;Cueing hierarchy;Dysphagia/aspiration precaution training;Functional tasks;Patient/family education;Multimodal communication approach   Daily Session  Skilled Therapeutic Interventions: Pt was seen for skilled ST targeting dysphagia and speech goals. SLP provided skilled observation of pt consuming current diet - Dys 1 (puree) solids and pudding thick liquids. No overt s/sx aspiration were observed throughout his intake  of solids or liquids, however Min oral residue of puree (pudding) noted. Pt required Mod A verbal  cues for secretion management throughout session today; thin white secretions noted to spill out of right anterior oral cavity. Overall quantity of secretions noted to be increased in comparison to previous encounters. SLP also provided verbal explanation, written handout, and demonstration of 2 pharyngeal strengthening exercises. Pt returned demonstrated of effortful swallow and chin tuck against resistance (CTAR) X3, however had difficulty triggering volitional dry swallow during exercises. Recommended pt work toward performing 10 reps of each exercise 3X daily. Pt's dysarthria still impacting functional communication, however greatly improved since admission. During functional conversational tasks with mix of known and unknown topics to this SLP, pt was ~70% intelligible at the phrase level and Mod A verbal cues for repetition of messages using pacing/slow rate and overarticulation. Pt's RN and other therapists also noting improvements in intelligibility as well. Pt left sitting in wheelchair with alarm set and needs within reach. Continue per current plan of care.       Pain Pain Assessment Pain Scale: Faces Pain Score: 0-No pain Pain Location: Head Pain Descriptors / Indicators: Headache Pain Onset: Awakened from sleep  Therapy/Group: Individual Therapy  Arbutus Leas 05/28/2019, 12:55 PM

## 2019-05-28 NOTE — Progress Notes (Signed)
Physical Therapy Session Note  Patient Details  Name: Jon [name] MRN: [id number] Date of Birth: 04/28/1966  Today's Date: 05/28/2019 PT Individual Time: 1115-1202 PT Individual Time Calculation (min): 47 min   Short Term Goals: Week 2:  PT Short Term Goal 1 (Week 2): Pt will perform bed mobility with CGA PT Short Term Goal 1 - Progress (Week 2): Met PT Short Term Goal 2 (Week 2): Pt will perform stand<>pivot transfer with LRAD min A PT Short Term Goal 2 - Progress (Week 2): Met PT Short Term Goal 3 (Week 2): Pt will perform car transfer with LRAD mod A PT Short Term Goal 3 - Progress (Week 2): Met Week 3:  PT Short Term Goal 1 (Week 3): Pt will perform stand<>pivot with LRAD CGA PT Short Term Goal 2 (Week 3): Pt will perform car transfer with LRAD min A PT Short Term Goal 3 (Week 3): Pt will ambulate 114f with LRAD min A  Skilled Therapeutic Interventions/Progress Updates:   Received pt sitting in WC, pt agreeable to therapy, and denied any pain throughout session. Pt on 1L O2 decreasing to 0.5L via [place] throughout session. Session focused on functional mobility/transfers, LE strength, ambulation, balance/coordination/motor planning, and improved endurance with activity. Pt continues to demonstrate dysarthria and remains impulsive with mobility, requiring max verbal cues for safety.  Pt performed WC mobility 1549fusing bilateral LEs and L UE with supervision. Pt ambulated 10067f 1 with RW min A +2 for safety/equipment. O2 sat 97% on 1L O2 after ambulation. Therapist decreased O2 to 0.5L and pt ambulated additional [id number] RW min A+2 for safety/equipment. O2 sat 85% increasing to 99% within 45 seconds. Potential error in pulse ox, pt asymptomatic. Pt continues to require verbal cues for increased R LE step length manual facilitation to maintain RW safety. Pt performed bilateral UE/LE strengthening on Nustep for 5 minutes at workload 6 for a total of 223 steps. O2 sat 98% after  activity. Pt transported back to room in WC. [facility] with pt sitting in WC, needs within reach, and seatbelt alarm on.   Therapy Documentation Precautions:  Precautions Precautions: Fall Precaution Comments: R hemi, dysarthria, dysphagia, on 4L O2 via [place], NG tube, and NPO Restrictions Weight Bearing Restrictions: No   Therapy/Group: Individual Therapy AnnaAlfonse Alpers DPT   05/28/2019, 7:38 AM

## 2019-05-28 NOTE — Progress Notes (Signed)
Physical Therapy Session Note  Patient Details  Name: Jon [name] MRN: [id number] Date of Birth: 1965-07-20  Today's Date: 05/28/2019 PT Individual Time: 1350-1407 ; 1450-1530 PT Individual Time Calculation (min): 17 min , 40 min Short Term Goals:  Week 3:  PT Short Term Goal 1 (Week 3): Pt will perform stand<>pivot with LRAD CGA PT Short Term Goal 2 (Week 3): Pt will perform car transfer with LRAD min A PT Short Term Goal 3 (Week 3): Pt will ambulate 155ft with LRAD min A      Skilled Therapeutic Interventions/Progress Updates:  tx 1:  Pt seated in w/c.  He rated HA "pretty bed", premedicated.  Therapeutic activity for pelvic dissociation, scooting forward/backward in w/c without use of UEs; TCS for wt shifting and assistance when moving backwards.  RN had been feeding pt magic cup; he asked for some more of it.  PT scooped small bites; pt fed himself.  Cues to clear throat.  Pt asked to get back to bed due to HA; PT infomed Terri, NT.  At end of session, pt seated in w/c with needs at hand and seat belt alarm set.   tx 2:  Pt resting in bed. He declined getting OOB, but agreed to bedside tx.  neuromuscular re-education via demo, multimodal cues for supine: bil bridging, lower trunk rotation with passive stretch at end ranges L/R.  In R side lying, bil hip flexion/extension, L hip abduciton with flexed knees and hips.  French Ana, RT arrived and pt agreed to ambulate, with urging.  Pt donned pants in supine with min assistance to thread pants over feet.  Supine> with with supervision.  Pt donned shoes with min assist Sit> stand with supervision.  Gait training iwht RW, CG x 100', CG x 100', mod assist x 20' due to scissoring with fatigue.  Pt has poor insight into safety/fatigue factor regarding when he needs to sit.  Pt on 1L O2> 0.5L by 3rd bout of ambulation.  Post-ambulation O2 sats = 90=94%.  At end of session, pt in bed with needs at hand and bed alarm set.       Therapy  Documentation Precautions:  Precautions Precautions: Fall Precaution Comments: R hemi, dysarthria, dysphagia, on 2L O2 via [place], NG tube, and NPO Restrictions Weight Bearing Restrictions: No  Pain: Pain Assessment Pain Scale: 0-10 Pain Score: 0-No pain       Therapy/Group: Individual Therapy  Jon [name] 05/28/2019, 5:00 PM

## 2019-05-28 NOTE — Progress Notes (Signed)
Occupational Therapy Session Note  Patient Details  Name: Jon [name] MRN: [id number] Date of Birth: 12-Oct-1965  Today's Date: 05/28/2019 OT Individual Time: 1001-1048 OT Individual Time Calculation (min): 47 min    Short Term Goals: Week 2:  OT Short Term Goal 1 (Week 2): Pt will transfer to the toilet with min A. OT Short Term Goal 2 (Week 2): Pt will don shoes with Mod assist OT Short Term Goal 3 (Week 2): Pt will complete bathing with min assist at sit > stand level  Skilled Therapeutic Interventions/Progress Updates:    Pt completed toileting to start session with NT already having transferred pt to the bathroom with use of the RW.  He was able to complete toilet hygiene in sitting with supervision and lateral lean to the left.  He needed min assist for removal of brief as well as for transfer over to the tub bench from the toilet.  He completed all bathing with overall min assist but needed mod instructional cueing for sequencing.  He demonstrated decreased ability to hold the washcloth with the RUE when attempting to wash the left arm, and required min assist for therapist.  When attempting to apply shampoo to his head, he tried holding the cup with the RUE and then dropped it on the floor with little awareness that he had lost it.  He also dropped the washcloth X 2 as well.  Therapist provided max instructional cueing for pt to slow down and maintain overall visual attention to the RUE when attempting to use it.  He transferred out to the sink for dressing and oral hygiene.  Min assist for transfer with use of the RW for support.  He donned a pullover shirt with supervision and donned his underpants and pants with min assist sit to stand.  He began dressing the LLE first instead of the right but he was still somewhat successful.  Min assist was needed for pulling his underpants and pants over his hips.  He needed mod assist for donning gripper socks secondary to not being able to grasp  the sock with the RUE.  Oral care was completed with setup.  Pt demonstrated coughing X 2 secondary to residual water in his mouth when rinsing, but he did not attempt to swallow any water.  Finished session with pt in the wheelchair at bedside with call button in reach and safety belt in place awaiting PT session in approximately 30 mins.    Therapy Documentation Precautions:  Precautions Precautions: Fall Precaution Comments: R hemi, dysarthria, dysphagia, on 4L O2 via [place], NG tube, and NPO Restrictions Weight Bearing Restrictions: No   Pain: Pain Assessment Pain Scale: Faces Pain Score: 0-No pain Pain Location: Head Pain Descriptors / Indicators: Headache Pain Onset: Awakened from sleep ADL: See Care Tool Section for some details of mobility and selfcare  Therapy/Group: Individual Therapy  Danny Yackley OTR/L 05/28/2019, 12:40 PM

## 2019-05-28 NOTE — Progress Notes (Signed)
ANTICOAGULATION CONSULT NOTE  Pharmacy Consult for Warfarin Indication: stroke and mechanical valve, atrial fibrillation  Allergies  Allergen Reactions  . Hydrocodone-Acetaminophen Other (See Comments)    PT MOTHER DOES NOT REMEMBER REACTION  Only use in Emergency   . Hydromorphone Itching and Rash       . Other Other (See Comments)    ALL NARCOTICS-ITCHING, RASH   . Oxycodone Other (See Comments)    Unknown reaction  . Penicillins Rash    Did it involve swelling of the face/tongue/throat, SOB, or low BP? Unknown Did it involve sudden or severe rash/hives, skin peeling, or any reaction on the inside of your mouth or nose? Unknown Did you need to seek medical attention at a hospital or doctor's office? Unknown When did it last happen?unknown If all above answers are "NO", may proceed with cephalosporin use.  . Dilaudid [Hydromorphone Hcl] Itching and Rash    Patient Measurements: Total Body Weight: 97.2 kg Height: 71 inches  Vital Signs: Temp: 98.4 F (36.9 C) (01/21 0319) BP: 130/75 (01/21 0319) Pulse Rate: 63 (01/21 0319)  Labs: Recent Labs    05/26/19 0745 05/27/19 0531 05/28/19 0525  LABPROT 30.9* 30.4* 29.8*  INR 3.0* 2.9* 2.8*    Estimated Creatinine Clearance: 121.3 mL/min (by C-G formula based on SCr of 0.85 mg/dL).   Assessment: 54 yr old male presented to the ED with stroke symptoms after recent discharge from [facility]. He is supposed to be on warfarin and Lovenox for history of a mechanical valve.  Patient is s/p thrombectomy and s/p bridging with Heparin to therapeutic INR with warfarin. Pharmacy consulted to dose.   .  INR therapeutic at 2.8 this AM, no bleeding reported, previous CBCs have been stable.     Goal of Therapy:  INR 2.5-3.5  Monitor platelets by anticoagulation protocol: Yes   Plan:  Give warfarin 7.5 mg PO x 1 today Daily INR, s/s bleeding  Daylene Posey, PharmD Clinical Pharmacist Please check AMION for all  [facility] Pharmacy numbers 05/28/2019 8:48 AM

## 2019-05-28 NOTE — Progress Notes (Signed)
Nutrition Follow-up  RD working remotely.  DOCUMENTATION CODES:   Obesity unspecified  INTERVENTION:   Decrease nocturnal tube feeding regimen: - Vital 1.5 @ 75 ml/hr to run for 10 hours from 2000 to 0600 - Pro-stat 30 ml BID - Free water per MD, currently 100 ml QID  Nocturnal tube feeding regimen and current free water provides 1325 kcal, 81 grams of protein, and 973 ml of H2O (58% kcal needs, 70% protein needs).  NUTRITION DIAGNOSIS:   Inadequate oral intake related to dysphagia as evidenced by NPO status.  Ongoing, being addressed via TF and diet advancement  GOAL:   Patient will meet greater than or equal to 90% of their needs  Progressing  MONITOR:   PO intake, Diet advancement, Labs, Weight trends, TF tolerance  REASON FOR ASSESSMENT:   Consult Enteral/tube feeding initiation and management  ASSESSMENT:   54 year old male with PMH of CAF, CHB-s/p PPM, migraines, aortic dissection s/p AVR complicated by CVA with residual dysarthria, gait disorder and visual deficits. Pt presented on 05/08/19 with sudden right-sided weakness and left gaze deviation. CT head showed abnormal hypodensity left colossal body and possible pericallosal gyrus. 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 CTA chest showed diffuse lung opacity concerning for PNA and dissection or aneurysm . He underwent cerebral angiogram with attempted thrombectomy of left A2 occlusion without significant improvement in flow and nonflow limiting dissection of distal cervical segment identified. Neurology felt the stroke was embolic in setting of subtherapeutic INR. Pt was placed on dysphagia 1 diet with honey-thick liquids due to dysphagia on 05/10/19. Chest x-ray performed on 05/12/19 suggestive of aspiration pneumonia.  Pt was made NPO and Cortrak placed for nutrition support. Pt admitted to CIR on 05/13/19.  01/15 - s/p MBSS with recommendations for Dysphagia 1 diet with honey-thick liquids  Cortrak remains in place.  Given improved PO intake, RD will decrease rate of nocturnal tube feeds to promote appetite.  Weight stable trending up since last RD assessment. Overall, down 12 lbs compared to admit weight.  Meal Completion: 35-100% x last 8 meals  Medications reviewed and include: SSI q 4 hours, protonix, warfarin  Labs reviewed. CBG's: 83-143 x 24 hours  UOP: 550 ml x 24 hours  Diet Order:   Diet Order            DIET - DYS 1 Room service appropriate? Yes; Fluid consistency: Pudding Thick  Diet effective now              EDUCATION NEEDS:   No education needs have been identified at this time  Skin:  Skin Assessment: Skin Integrity Issues: Incisions: right groin  Last BM:  05/28/19 large type 4  Height:   Ht Readings from Last 1 Encounters:  05/16/19 5\' 11"  (1.803 m)    Weight:   Wt Readings from Last 1 Encounters:  05/28/19 100.3 kg    Ideal Body Weight:  78.2 kg  BMI:  Body mass index is 30.84 kg/m.  Estimated Nutritional Needs:   Kcal:  2300-2500  Protein:  115-130 grams  Fluid:  >/= 2.0 L    05/30/19, MS, RD, LDN Inpatient Clinical Dietitian Pager: [phone] Weekend/After Hours: [phone]

## 2019-05-29 ENCOUNTER — Inpatient Hospital Stay (HOSPITAL_COMMUNITY): Payer: Medicaid Other

## 2019-05-29 ENCOUNTER — Inpatient Hospital Stay (HOSPITAL_COMMUNITY): Payer: Medicaid Other | Admitting: Speech Pathology

## 2019-05-29 ENCOUNTER — Inpatient Hospital Stay (HOSPITAL_COMMUNITY): Payer: Medicaid Other | Admitting: Occupational Therapy

## 2019-05-29 LAB — GLUCOSE, CAPILLARY
Glucose-Capillary: 105 mg/dL — ABNORMAL HIGH (ref 70–99)
Glucose-Capillary: 120 mg/dL — ABNORMAL HIGH (ref 70–99)
Glucose-Capillary: 126 mg/dL — ABNORMAL HIGH (ref 70–99)
Glucose-Capillary: 84 mg/dL (ref 70–99)
Glucose-Capillary: 92 mg/dL (ref 70–99)
Glucose-Capillary: 93 mg/dL (ref 70–99)
Glucose-Capillary: 94 mg/dL (ref 70–99)

## 2019-05-29 LAB — PROTIME-INR
INR: 3.1 — ABNORMAL HIGH (ref 0.8–1.2)
Prothrombin Time: 31.5 seconds — ABNORMAL HIGH (ref 11.4–15.2)

## 2019-05-29 MED ORDER — FREE WATER
100.0000 mL | Freq: Two times a day (BID) | Status: DC
  Administered 2019-05-29 – 2019-05-30 (×2): 100 mL

## 2019-05-29 MED ORDER — WARFARIN SODIUM 7.5 MG PO TABS
7.5000 mg | ORAL_TABLET | Freq: Once | ORAL | Status: AC
  Administered 2019-05-29: 17:00:00 7.5 mg via ORAL
  Filled 2019-05-29: qty 1

## 2019-05-29 NOTE — Progress Notes (Signed)
[place] PHYSICAL MEDICINE & REHABILITATION PROGRESS NOTE  Subjective/Complaints: Patient seen laying in bed this AM.  He states he states well overnight.  Rate of speech improved with cues with increased intelligibility.  He states he wants to make sure that he is safe prior to discharge.  ROS: Denies CP, shortness of breath, nausea, vomiting, diarrhea.  Objective: Vital Signs: Blood pressure 117/88, pulse 65, temperature 98.3 F (36.8 C), resp. rate 18, height 5\' 11"  (1.803 m), weight 101.3 kg, SpO2 98 %. No results found. No results for input(s): WBC, HGB, HCT, PLT in the last 72 hours. No results for input(s): NA, K, CL, CO2, GLUCOSE, BUN, CREATININE, CALCIUM in the last 72 hours.  Physical Exam: BP 117/88   Pulse 65   Temp 98.3 F (36.8 C)   Resp 18   Ht 5\' 11"  (1.803 m)   Wt 101.3 kg   SpO2 98%   BMI 31.15 kg/m  Constitutional: No distress . Vital signs reviewed.  Obese. HENT: Normocephalic.  Atraumatic.  + NG. Eyes: EOMI. No discharge. Cardiovascular: No JVD. Respiratory: Normal effort.  No stridor. GI: Non-distended. Skin: Warm and dry.  Intact. Psych: Normal mood.  Normal behavior. Musc: No edema in extremities.  No tenderness in extremities. Neurological: Alert Severe dysarthria, stable Able to follow simple motor commands.  Motor:  RUE: 4-4+/5 proximal distal, unchanged RLE: Hip flexion, knee extension 4 +/5, dorsiflexion 4+/5, unchanged  Assessment/Plan: 1. 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  Physiatrist is providing close team supervision and 24 hour management of active medical problems listed below.  Physiatrist and rehab team continue to assess barriers to discharge/monitor patient progress toward functional and medical goals  Care Tool:  Bathing    Body parts bathed by patient: Right arm, Abdomen, Right  upper leg, Left upper leg, Face, Chest, Front perineal area, Buttocks, Right lower leg, Left lower leg   Body parts bathed by helper: Left arm     Bathing assist Assist Level: Minimal Assistance - Patient > 75%     Upper Body Dressing/Undressing Upper body dressing   What is the patient wearing?: Pull over shirt    Upper body assist Assist Level: Supervision/Verbal cueing    Lower Body Dressing/Undressing Lower body dressing      What is the patient wearing?: Pants, Underwear/pull up     Lower body assist Assist for lower body dressing: Minimal Assistance - Patient > 75%     Toileting Toileting    Toileting assist Assist for toileting: Minimal Assistance - Patient > 75%     Transfers Chair/bed transfer  Transfers assist     Chair/bed transfer assist level: Minimal Assistance - Patient > 75%     Locomotion Ambulation   Ambulation assist   Ambulation activity did not occur: Safety/medical concerns(fatigue, LE weakness, restricted to threapy in room due to IV fluids)  Assist level: 2 helpers Assistive device: Walker-rolling Max distance: 18ft   Walk 10 feet activity   Assist  Walk 10 feet activity did not occur: Safety/medical concerns(fatigue, LE weakness, restricted to threapy in room due to IV fluids)  Assist level: 2 helpers Assistive device: Walker-rolling   Walk 50 feet activity   Assist Walk 50 feet with 2 turns activity did not occur: Safety/medical concerns(fatigue, LE weakness, restricted to threapy in room due to IV fluids)  Assist level: 2 helpers Assistive device:  Walk 150 feet activity   Assist Walk 150 feet activity did not occur: Safety/medical concerns(fatigue, LE weakness, restricted to threapy in room due to IV fluids)  Assist level: 2 helpers Assistive device: Walker-rolling    Walk 10 feet on uneven surface  activity   Assist Walk 10 feet on uneven surfaces activity did not occur: Safety/medical  concerns(fatigue, LE weakness, restricted to threapy in room due to IV fluids)         Wheelchair     Assist Will patient use wheelchair at discharge?: Yes Type of Wheelchair: Manual Wheelchair activity did not occur: Safety/medical concerns(fatigue, R hemi, restricted to threapy in room due to IV fluids)  Wheelchair assist level: Supervision/Verbal cueing Max wheelchair distance: 1100ft    Wheelchair 50 feet with 2 turns activity    Assist    Wheelchair 50 feet with 2 turns activity did not occur: Safety/medical concerns(fatigue, R hemi, restricted to threapy in room due to IV fluids)   Assist Level: Supervision/Verbal cueing   Wheelchair 150 feet activity     Assist Wheelchair 150 feet activity did not occur: Safety/medical concerns(fatigue, R hemi, restricted to threapy in room due to IV fluids)   Assist Level: Supervision/Verbal cueing      Medical Problem List and Plan: 1.  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  Continue CIR 2.  Antithrombotics: -DVT/anticoagulation:  Pharmaceutical: Coumadin goal 2.5-3.5.   INR therapeutic on 1/22  -antiplatelet therapy: on ASA 3. Pain Management: tylenol prn 4. Mood: LCSW to follow for evaluation and support when appropriate.              -antipsychotic agents: N/A 5. Neuropsych: This patient is not fully capable of making decisions on his own behalf. 6. Skin/Wound Care: Routine pressure relief measures.  7. Fluids/Electrolytes/Nutrition: Monitor electrolytes 8. Aspiration PNA: Completed 7 day course of Unasyn on 1/12. Needs to be upright with pulmonary toilet.    Wean supplemental oxygen as tolerated 9. CAF/CHB s/p PPM:   Monitor HR Off amiodarone and Entresto due to issues with hypotension.  11 COPD/OSA: Duonebs and incruse resumed. Encourage IS and keep HOB>30 degrees.   Communicated  medication adjustments with respiratory therapy 12. Depression/anxiety disorder: Emotional support 13. H/o migraines: Has been managed on Topamax and gabapentin.  14. Hypokalemia:   Kdur DC'd on 1/11  Potassium 4.2 on 1/18, labs ordered for Monday  Continue to monitor 15.  Hyperglycemia-secondary to tube feeds  Relatively controlled on 1/22  Continue SSI  Continue to monitor 16.  Leukocytosis: Resolved  WBCs 9.0 on 1/13  Afebrile  Continue to monitor 17.  Acute blood loss anemia  Hemoglobin 11.4 on 1/18, labs ordered for Monday  Continue to monitor 18.  Hypoalbuminemia  Adjust with tube feeds 19.  Acute on chronic combined congestive heart failure  EF 25-30%  Daily weights  No sign of fluid overload at this time, continue to monitor [facility] Weights   05/27/19 0417 05/28/19 0500 05/29/19 0409  Weight: 100.5 kg 100.3 kg 101.3 kg   Lasix 20 daily started on 1/19, increased on 1/20  Free water flushes decreased on 1/22  ? Trending up again on 1/22 20.  Loose stools  Discussed changing tube feeds with dietary  KUB reviewed, unremarkable  Improved after completion of abx 21.  Post stroke dysphagia  Continue tube feeds  Advance D1 pudding  Continue to advance as tolerated   Educated patient again  LOS: 16 days A FACE TO FACE EVALUATION WAS PERFORMED  Marlys Stegmaier Lorie Phenix 05/29/2019, 8:55 AM

## 2019-05-29 NOTE — Progress Notes (Signed)
Occupational Therapy Weekly Progress Note  Patient Details  Name: Jon [name] MRN: [id number] Date of Birth: [birth year]  Beginning of progress report period: May 22, 2019 End of progress report period: May 29, 2019   Patient has met 3 of 3 short term goals.  Pt is making steady progress towards goals.  Pt is currently able to complete stand pivot transfers with Min assist to CGA, requiring cues for safety due to impulsivity and decreased awareness of impairments.  Pt currently requires min assist for dynamic standing balance and hand over hand assist to incorporate use of dominant RUE into self-care tasks.  Pt continues to demonstrate ataxia as well as decreased sensation and proprioception in RUE.  Pt continues to demonstrate severe dysarthria, however is able to utilize speech strategies intermittently which is improving his intelligibility.   Patient continues to demonstrate the following deficits: muscle weakness,decreased cardiorespiratoy endurance and decreased oxygen support,impaired timing and sequencing and unbalanced muscle activation,decreased visual acuity,decreased attention, decreased awareness, decreased safety awareness and decreased memoryand decreased sitting balance, decreased standing balance, hemiplegia and decreased balance strategies and therefore will continue to benefit from skilled OT intervention to enhance overall performance with BADL and Reduce care partner burden.  Patient progressing toward long term goals..  Continue plan of care.  OT Short Term Goals Week 2:  OT Short Term Goal 1 (Week 2): Pt will transfer to the toilet with min A. OT Short Term Goal 1 - Progress (Week 2): Met OT Short Term Goal 2 (Week 2): Pt will don shoes with Mod assist OT Short Term Goal 2 - Progress (Week 2): Met OT Short Term Goal 3 (Week 2): Pt will complete bathing with min assist at sit > stand level OT Short Term Goal 3 - Progress (Week 2): Met Week 3:  OT Short  Term Goal 1 (Week 3): Pt will complete toilet transfers with min assist ambulating with RW OT Short Term Goal 2 (Week 3): Pt will complete LB dressing to include donning socks/shoes with min assist OT Short Term Goal 3 (Week 3): Pt will complete 2 grooming tasks in standing with min assist for increased standing tolerance/balance   Fabiano Ginley, [facility] 05/29/2019, 7:23 AM

## 2019-05-29 NOTE — Progress Notes (Signed)
Physical Therapy Session Note  Patient Details  Name: Jon [name] MRN: [id number] Date of Birth: 04/30/1966  Today's Date: 05/29/2019 PT Individual Time: 0900-0959 PT Individual Time Calculation (min): 59 min   Short Term Goals: Week 2:  PT Short Term Goal 1 (Week 2): Pt will perform bed mobility with CGA PT Short Term Goal 1 - Progress (Week 2): Met PT Short Term Goal 2 (Week 2): Pt will perform stand<>pivot transfer with LRAD min A PT Short Term Goal 2 - Progress (Week 2): Met PT Short Term Goal 3 (Week 2): Pt will perform car transfer with LRAD mod A PT Short Term Goal 3 - Progress (Week 2): Met Week 3:  PT Short Term Goal 1 (Week 3): Pt will perform stand<>pivot with LRAD CGA PT Short Term Goal 2 (Week 3): Pt will perform car transfer with LRAD min A PT Short Term Goal 3 (Week 3): Pt will ambulate 160f with LRAD min A  Skilled Therapeutic Interventions/Progress Updates:   Received pt supine in bed, pt agreeable to therapy, and denied any pain throughout session. Session focused on functional mobility/transfers, LE strength, ambulation, balance/coordination/motor planning, and improved endurance with activity. Pt on 1 L O2 via [place] decreasing to RA. Pt reported urge to use restroom and was very impulsive attempting to get to bathroom. Pt required verbal cues to slow down and let therapist prepare everything to get to bathroom. Pt performed bed mobility with supervision and ambulated 170fwith RW min A to bathroom. Pt performed toilet transfer min A and required min A for LE clothing management but able to perform hygiene management with supervision. Pt refused to wear incontinence brief and donned underwear and pants in sitting with min A and required min A to pull pants over hips in standing. Pt donned pull over shirt seated with supervision. Pt stood at sink and combed hair and washed face with washcloth sitting in WC, both with supervision. Pt refusing to wear O2. O2 sat sitting in  WC 97% on RA. [facility] purposes. Pt ambulated 8034fith RW min A +2 for WC follow on RA. O2 sat 96% and HR 77bpm after activity. Pt performed furniture transfer on couch with RW min A. Pt ambulated additional 150f31fth RW min A +2 for WC follow. O2 sat 87% increasing to 94% with seated rest break and education for pursed lip breathing. Pt navigated 8 steps with 2 rails min A ascending and descending with a a step to pattern. Pt required verbal cues for foot placement on step, turning, and stepping technique. O2 sat 87% increasing to 96% on RA with seated rest break. Pt performed bilateral LE strengthening on Kinetron at 10cm/sec for 2.5 minutes with bilateral UE support on handgrips. O2 sat 96% on RA after activity. Concluded session with pt sitting in WC, needs within reach, seatbelt alarm on. Pt on 0.5L O2 via [place] at end of session.   Therapy Documentation Precautions:  Precautions Precautions: Fall Precaution Comments: R hemi, dysarthria, dysphagia, on 4L O2 via [place], NG tube, and NPO Restrictions Weight Bearing Restrictions: No  Therapy/Group: Individual Therapy AnnaAlfonse Alpers DPT   05/29/2019, 7:28 AM

## 2019-05-29 NOTE — Plan of Care (Signed)
  Problem: RH Swallowing Goal: LTG Patient will consume least restrictive diet using compensatory strategies with assistance (SLP) Description: LTG:  Patient will consume least restrictive diet using compensatory strategies with assistance (SLP) Flowsheets (Taken 05/29/2019 1531) LTG: Pt Patient will consume least restrictive diet using compensatory strategies with assistance of (SLP): Minimal Assistance - Patient > 75% Goal: LTG Patient will participate in dysphagia therapy to increase swallow function with assistance (SLP) Description: LTG:  Patient will participate in dysphagia therapy to increase swallow function with assistance (SLP) Flowsheets (Taken 05/29/2019 1531) LTG: Pt will participate in dysphagia therapy to increase swallow function with assistance of (SLP): Minimal Assistance - Patient > 75%  Downgraded due to slow progress and baseline impulsivity impacting pt's carryover of swallow strategies

## 2019-05-29 NOTE — Progress Notes (Signed)
Speech Language Pathology Daily Session Note  Patient Details  Name: Jon [name] MRN: [id number] Date of Birth: December 11, 1965  Today's Date: 05/29/2019 SLP Individual Time: 1300-1345 SLP Individual Time Calculation (min): 45 min  Short Term Goals: Week 3: SLP Short Term Goal 1 (Week 3): Pt will consume current diet with minimal overt s/sx aspiration and Supervision A for use of swallow strategies. SLP Short Term Goal 2 (Week 3): Pt wil consume upgraded trials Dys 2 X2 with efficient mastication and oral clearance X3 prior to solid upgrade. SLP Short Term Goal 3 (Week 3): During therapeutic trials of thin via teaspoon or small ice chips, pt will implement chin tuck strategy with Min A verbal cues. SLP Short Term Goal 4 (Week 3): Pt will utilize speech intelligibility strategies (specifically pausing between words and overarticulation) to increase intelligibility to 70% at the word level with Min A verbal cues. SLP Short Term Goal 5 (Week 3): Pt will demonstrate basic problem solving during functional tasks with Min A verbal/visual cues.   Skilled Therapeutic Interventions: Pt was seen for skilled ST targeting dysphagia goals and education with pt and his mother who was present for session. SLP facilitated session with verbal review of pt's current diet recommendations and rationale behind thickened liquids throughout day as well as thin liquid trials with chin tuck only with SLP. SLP also provided verbal review and demonstration with some hands on portions of teaching pt's mother how to use thickener to achieve pudding thick consistency and specified pudding thick liquid needs to be thick enough to not pass through straw - must be administered via spoon because of thickness. Also discussed Dys 1 (puree) consistency and need for food processor or blender to achieve smooth puree consistency if pt goes home on current diet. SLP also noted impact of pt's impulsivity on swallowing safety and emphasized  importance of 24/7 supervision (including during meals) and assistance with slow rate of intake. After oral care with suction, pt consumed 3 ice chips, with immediate cough noted X1. Verbal cues also required for pt to remember to implement chin tuck with each trial of thin. Pt subsequently accepted upgraded trial of Dys 2 (minced) solids with prolonged mastication and trace oral residue noted. No overt s/sx aspiration noted during pt's consumption of Dys 2 or pudding thick liquids. Mod A verbal cues were required for use of slow rate. Recommend continue current diet. Pt left laying in bed with alarm set and needs within reach, mother still present. Continue per current plan of care.        Pain Pain Assessment Pain Scale: Faces Faces Pain Scale: No hurt  Therapy/Group: Individual Therapy  [name] 05/29/2019, 3:25 PM

## 2019-05-29 NOTE — Progress Notes (Signed)
Occupational Therapy Session Note  Patient Details  Name: Jon [name] MRN: [id number] Date of Birth: October 26, 1965  Today's Date: 05/29/2019 OT Individual Time: 1050-1202 OT Individual Time Calculation (min): 72 min    Short Term Goals: Week 3:  OT Short Term Goal 1 (Week 3): Pt will complete toilet transfers with min assist ambulating with RW OT Short Term Goal 2 (Week 3): Pt will complete LB dressing to include donning socks/shoes with min assist OT Short Term Goal 3 (Week 3): Pt will complete 2 grooming tasks in standing with min assist for increased standing tolerance/balance  Skilled Therapeutic Interventions/Progress Updates:    Pt seen for OT session, reporting increased headache at start of session.  Pt's nurse notified and medication given.  Pt agreed to try in therapy even though his head was bothering him.  Therapist took pt down to the [employer] where he worked on [employer] coordination in standing with use of the [employer].  He was able to stand with min guard assist while using the right index finger for one minute intervals.  Therapist began with use of only the inner 3 circles of the upper half of the board with an average time of 4.3 seconds between each light.  Next the scanning area was increased to include both the upper and lower parts of the 3 circles and he was able to complete in an average of 5 seconds. Next, he was able to complete an interval with use of the entire board with an average of 8.5 seconds.  The final set was performed with a time limit of 5 seconds on each light and use of the complete board.  He was able to perform 8/15 with the RUE.  Oxygen sats were monitored throughout trials on room air and he was able to maintain them at 96% or better without any SOB noted.  He next worked on functional reach with the RUE while sitting on a mat.  Therapist had him work on reaching for cups and then stacking them beside of him.  Emphasis on maintaining visual attention to  the hand during the entire reaching and grasping process.  Pt continues to demonstrate decreased sensation in the right hand as well as decreased proprioception as well as decreased FM coordination.  At times he would demonstrate difficulty grasping the cup with a lateral pinch grip, requiring mod assist to complete.  He also knocked the cups over on one occasion as he did not maintain visual attention to the hand after placing the cup and when attempting to reach for the next one, he drug his hand across the one he had just placed.  Finished session with transfer back to the room and transfer to the bed with min assist and use of the RW.  He needed mod instructional cueing for correct safe use of the walker as he grasps onto it with sit to stand and then did square up to the surface to sit and did not reach back.  He transitioned to supine with supervision.  Call button and phone in reach with bed alarm in place and pt's mother present as well.    Therapy Documentation Precautions:  Precautions Precautions: Fall Precaution Comments: R hemi, dysarthria, dysphagia, on .5L O2 via [place], NG tube, and NPO Restrictions Weight Bearing Restrictions: No   Vital Signs: Therapy Vitals Temp: 97.8 F (36.6 C) Pulse Rate: 63 BP: 110/66 Patient Position (if appropriate): Lying Oxygen Therapy SpO2: 97 % O2 Device: Nasal Cannula Pain: Pain  Assessment Pain Scale: Faces Faces Pain Scale: No hurt ADL: See Care Tool Section for some details of mobility and selfcare  Therapy/Group: Individual Therapy  Tashara Suder OTR/L 05/29/2019, 4:14 PM

## 2019-05-30 ENCOUNTER — Inpatient Hospital Stay (HOSPITAL_COMMUNITY): Payer: Medicaid Other | Admitting: Occupational Therapy

## 2019-05-30 LAB — GLUCOSE, CAPILLARY
Glucose-Capillary: 129 mg/dL — ABNORMAL HIGH (ref 70–99)
Glucose-Capillary: 97 mg/dL (ref 70–99)
Glucose-Capillary: 95 mg/dL (ref 70–99)
Glucose-Capillary: 117 mg/dL — ABNORMAL HIGH (ref 70–99)

## 2019-05-30 LAB — PROTIME-INR
INR: 2.9 — ABNORMAL HIGH (ref 0.8–1.2)
Prothrombin Time: 30 seconds — ABNORMAL HIGH (ref 11.4–15.2)

## 2019-05-30 MED ORDER — FUROSEMIDE 40 MG PO TABS
40.0000 mg | ORAL_TABLET | Freq: Two times a day (BID) | ORAL | Status: DC
  Administered 2019-05-30: 18:00:00 40 mg via ORAL
  Filled 2019-05-30: qty 1

## 2019-05-30 MED ORDER — WARFARIN SODIUM 7.5 MG PO TABS
7.5000 mg | ORAL_TABLET | Freq: Once | ORAL | Status: AC
  Administered 2019-05-30: 18:00:00 7.5 mg via ORAL
  Filled 2019-05-30: qty 1

## 2019-05-30 NOTE — Progress Notes (Signed)
Occupational Therapy Session Note  Patient Details  Name: Jon [name] MRN: [id number] Date of Birth: 02-Aug-1965  Today's Date: 05/30/2019 OT Individual Time: 1100-1215 OT Individual Time Calculation (min): 75 min    Short Term Goals: Week 3:  OT Short Term Goal 1 (Week 3): Pt will complete toilet transfers with min assist ambulating with RW OT Short Term Goal 2 (Week 3): Pt will complete LB dressing to include donning socks/shoes with min assist OT Short Term Goal 3 (Week 3): Pt will complete 2 grooming tasks in standing with min assist for increased standing tolerance/balance  Skilled Therapeutic Interventions/Progress Updates: participation focus as follows:  Bed to RW for toielt transfer=close S              Toileting=extra time for elimination of BM but able to stand and cleanse self with CGA\  Shower transfer=close S via walker , grab bar and tub branch bench with back  UB bathing and dressing = Min A  And Min HOH assist  Lb bathing and dressing sit to stand= Min A, especially for balance as he chose to bend forward to don socks (wore no shoes this session)  Dynamic standing balance Fair - before fatigueing  - after fatiguing dynamic and static balance= poor with right lateral leans and pt requiring moderate physical heavy assist to attempt to back to midline postural sitting, standing or positioning.  Grooming= extra time as he initated using right hemiparesis UE  Patient with difficulty crossing midline with right hand and eyes   - At end of session, after fatiguing he was very heavily leaning right laterally; so, one needs to stay close by.   He rquired moderate assistance to come back to midline  He stated he was very fatigued at end of session and asked toly back down to go back to sleep  Continue OT plan of care     Therapy Documentation Precautions:  Precautions Precautions: Fall Precaution Comments: R hemi, dysarthria, dysphagia, on .5L O2 via [place], NG  tube, and NPO Restrictions Weight Bearing Restrictions: No    Pain:denied    Therapy/Group: Individual Therapy  [facility] 05/30/2019, 1:39 PM

## 2019-05-30 NOTE — Progress Notes (Signed)
ANTICOAGULATION CONSULT NOTE  Pharmacy Consult for Warfarin Indication: stroke and mechanical valve, atrial fibrillation  Allergies  Allergen Reactions  . Hydrocodone-Acetaminophen Other (See Comments)    PT MOTHER DOES NOT REMEMBER REACTION  Only use in Emergency   . Hydromorphone Itching and Rash       . Other Other (See Comments)    ALL NARCOTICS-ITCHING, RASH   . Oxycodone Other (See Comments)    Unknown reaction  . Penicillins Rash    Did it involve swelling of the face/tongue/throat, SOB, or low BP? Unknown Did it involve sudden or severe rash/hives, skin peeling, or any reaction on the inside of your mouth or nose? Unknown Did you need to seek medical attention at a hospital or doctor's office? Unknown When did it last happen?unknown If all above answers are "NO", may proceed with cephalosporin use.  . Dilaudid [Hydromorphone Hcl] Itching and Rash    Patient Measurements: Total Body Weight: 97.2 kg Height: 71 inches  Vital Signs: Temp: 97.6 F (36.4 C) (01/23 0425) Temp Source: Oral (01/22 2000) BP: 116/72 (01/23 0425) Pulse Rate: 60 (01/23 0425)  Labs: Recent Labs    05/28/19 0525 05/29/19 0535 05/30/19 0609  LABPROT 29.8* 31.5* 30.0*  INR 2.8* 3.1* 2.9*    Estimated Creatinine Clearance: 122.5 mL/min (by C-G formula based on SCr of 0.85 mg/dL).   Assessment: 54 yr old male presented to the ED with stroke symptoms after recent discharge from [facility]. He is supposed to be on warfarin and Lovenox for history of a mechanical valve.  Patient is s/p thrombectomy and s/p bridging with Heparin to therapeutic INR with warfarin. Pharmacy consulted to dose.   INR continues to be therapeutic at 2.9 this AM, no bleeding reported, previous CBCs have been stable.     Goal of Therapy:  INR 2.5-3.5  Monitor platelets by anticoagulation protocol: Yes   Plan:  Give warfarin 7.5 mg PO x 1 today Daily INR, s/s bleeding  Domenic Moras,  PharmD PGY1 Ambulatory Care Pharmacy Resident 05/30/2019 7:07 AM

## 2019-05-30 NOTE — Progress Notes (Signed)
[place] PHYSICAL MEDICINE & REHABILITATION PROGRESS NOTE  Subjective/Complaints: No issues overnight. Anticipating therapy this morning  ROS: Patient denies fever, rash, sore throat, blurred vision, nausea, vomiting, diarrhea, cough, shortness of breath or chest pain, joint or back pain, headache, or mood change.   Objective: Vital Signs: Blood pressure 116/72, pulse 60, temperature 97.6 F (36.4 C), resp. rate 20, height 5\' 11"  (1.803 m), weight 102.6 kg, SpO2 96 %. No results found. No results for input(s): WBC, HGB, HCT, PLT in the last 72 hours. No results for input(s): NA, K, CL, CO2, GLUCOSE, BUN, CREATININE, CALCIUM in the last 72 hours.  Physical Exam: BP 116/72 (BP Location: Left Arm)   Pulse 60   Temp 97.6 F (36.4 C)   Resp 20   Ht 5\' 11"  (1.803 m)   Wt 102.6 kg   SpO2 96%   BMI 31.55 kg/m  Constitutional: No distress . Vital signs reviewed. HEENT: EOMI, oral membranes moist Neck: supple Cardiovascular: RRR without murmur. No JVD    Respiratory: CTA Bilaterally without wheezes or rales. Normal effort    GI: BS +, non-tender, non-distended  Skin: Warm and dry.  Intact. Psych: Normal mood.  Normal behavior. Musc: No edema in extremities.  No tenderness in extremities. Neurological: Alert, fixed upward gaze Severe dysarthria present, barely intelligible Able to follow simple motor commands.  Motor:  RUE: 4-4+/5 proximal distal, stable RLE: Hip flexion, knee extension 4 +/5, dorsiflexion 4+/5, stable  Assessment/Plan: 1. 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  Physiatrist is providing close team supervision and 24 hour management of active medical problems listed below.  Physiatrist and rehab team continue to assess barriers to discharge/monitor patient progress toward functional and medical goals  Care Tool:  Bathing     Body parts bathed by patient: Right arm, Abdomen, Right upper leg, Left upper leg, Face, Chest, Front perineal area, Buttocks, Right lower leg, Left lower leg   Body parts bathed by helper: Left arm     Bathing assist Assist Level: Minimal Assistance - Patient > 75%     Upper Body Dressing/Undressing Upper body dressing   What is the patient wearing?: Pull over shirt    Upper body assist Assist Level: Supervision/Verbal cueing    Lower Body Dressing/Undressing Lower body dressing      What is the patient wearing?: Underwear/pull up, Pants     Lower body assist Assist for lower body dressing: Moderate Assistance - Patient 50 - 74%     Toileting Toileting    Toileting assist Assist for toileting: Minimal Assistance - Patient > 75%     Transfers Chair/bed transfer  Transfers assist     Chair/bed transfer assist level: Minimal Assistance - Patient > 75%     Locomotion Ambulation   Ambulation assist   Ambulation activity did not occur: Safety/medical concerns(fatigue, LE weakness, restricted to threapy in room due to IV fluids)  Assist level: 2 helpers Assistive device: Walker-rolling Max distance: 155ft   Walk 10 feet activity   Assist  Walk 10 feet activity did not occur: Safety/medical concerns(fatigue, LE weakness, restricted to threapy in room due to IV fluids)  Assist level: 2 helpers Assistive device: Walker-rolling   Walk 50 feet activity   Assist Walk 50 feet with 2 turns activity did not occur: Safety/medical concerns(fatigue, LE weakness, restricted to threapy in room due to IV fluids)  Assist level:  2 helpers Assistive device: Walker-rolling    Walk 150 feet activity   Assist Walk 150 feet activity did not occur: Safety/medical concerns(fatigue, LE weakness, restricted to threapy in room due to IV fluids)  Assist level: 2 helpers Assistive device: Walker-rolling    Walk 10 feet on uneven surface  activity   Assist Walk 10 feet  on uneven surfaces activity did not occur: Safety/medical concerns(fatigue, LE weakness, restricted to threapy in room due to IV fluids)         Wheelchair     Assist Will patient use wheelchair at discharge?: Yes Type of Wheelchair: Manual Wheelchair activity did not occur: Safety/medical concerns(fatigue, R hemi, restricted to threapy in room due to IV fluids)  Wheelchair assist level: Supervision/Verbal cueing Max wheelchair distance: 161ft    Wheelchair 50 feet with 2 turns activity    Assist    Wheelchair 50 feet with 2 turns activity did not occur: Safety/medical concerns(fatigue, R hemi, restricted to threapy in room due to IV fluids)   Assist Level: Supervision/Verbal cueing   Wheelchair 150 feet activity     Assist Wheelchair 150 feet activity did not occur: Safety/medical concerns(fatigue, R hemi, restricted to threapy in room due to IV fluids)   Assist Level: Supervision/Verbal cueing      Medical Problem List and Plan: 1.  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  Continue CIR 2.  Antithrombotics: -DVT/anticoagulation:  Pharmaceutical: Coumadin goal 2.5-3.5.   INR 2.9 1/23  -antiplatelet therapy: on ASA 3. Pain Management: tylenol prn 4. Mood: LCSW to follow for evaluation and support when appropriate.              -antipsychotic agents: N/A 5. Neuropsych: This patient is not fully capable of making decisions on his own behalf. 6. Skin/Wound Care: Routine pressure relief measures.  7. Fluids/Electrolytes/Nutrition: Monitor electrolytes 8. Aspiration PNA: Completed 7 day course of Unasyn on 1/12. Needs to be upright with pulmonary toilet.    Wean supplemental oxygen as tolerated 9. CAF/CHB s/p PPM:   Monitor HR Off amiodarone and Entresto due to issues with hypotension.  11 COPD/OSA: Duonebs and incruse resumed. Encourage IS  and keep HOB>30 degrees.   Communicated medication adjustments with respiratory therapy 12. Depression/anxiety disorder: Emotional support 13. H/o migraines: Has been managed on Topamax and gabapentin.  14. Hypokalemia:   Kdur DC'd on 1/11  Potassium 4.2 on 1/18, labs ordered for Monday  Continue to monitor 15.  Hyperglycemia-secondary to tube feeds  Relatively controlled on 1/23  Continue SSI    16.  Leukocytosis: Resolved  WBCs 9.0 on 1/13  Afebrile  Continue to monitor 17.  Acute blood loss anemia  Hemoglobin 11.4 on 1/18, labs ordered for Monday  Continue to monitor 18.  Hypoalbuminemia  Adjust with tube feeds 19.  Acute on chronic combined congestive heart failure  EF 25-30%  Daily weights  No sign of fluid overload at this time, continue to monitor [facility] Weights   05/28/19 0500 05/29/19 0409 05/30/19 0425  Weight: 100.3 kg 101.3 kg 102.6 kg   Lasix 20 daily started on 1/19, increased on 1/20  Free water flushes decreased on 1/22---hold on 1/23  -pt is on 40mg  am and 20mg  pm lasix at home  1/23 Further weight increase today--increase lasix to 40mg  bid for now   -recheck bmet in am 20.  Loose stools  Discussed changing tube feeds with dietary  KUB reviewed,  unremarkable  Improved after completion of abx 21.  Post stroke dysphagia  Continue tube feeds  Advance D1 pudding  Continue to advance as tolerated   Educated patient again  LOS: 17 days A FACE TO FACE EVALUATION WAS PERFORMED  Ranelle Oyster 05/30/2019, 11:45 AM

## 2019-05-31 ENCOUNTER — Inpatient Hospital Stay (HOSPITAL_COMMUNITY): Payer: Medicaid Other | Admitting: Speech Pathology

## 2019-05-31 LAB — GLUCOSE, CAPILLARY
Glucose-Capillary: 100 mg/dL — ABNORMAL HIGH (ref 70–99)
Glucose-Capillary: 112 mg/dL — ABNORMAL HIGH (ref 70–99)
Glucose-Capillary: 116 mg/dL — ABNORMAL HIGH (ref 70–99)
Glucose-Capillary: 118 mg/dL — ABNORMAL HIGH (ref 70–99)
Glucose-Capillary: 128 mg/dL — ABNORMAL HIGH (ref 70–99)
Glucose-Capillary: 89 mg/dL (ref 70–99)
Glucose-Capillary: 89 mg/dL (ref 70–99)

## 2019-05-31 LAB — BASIC METABOLIC PANEL
Anion gap: 11 (ref 5–15)
BUN: 16 mg/dL (ref 6–20)
CO2: 26 mmol/L (ref 22–32)
Calcium: 8.8 mg/dL — ABNORMAL LOW (ref 8.9–10.3)
Chloride: 104 mmol/L (ref 98–111)
Creatinine, Ser: 0.83 mg/dL (ref 0.61–1.24)
GFR calc Af Amer: >60 mL/min (ref >60)
GFR calc non Af Amer: >60 mL/min (ref >60)
Glucose, Bld: 127 mg/dL — ABNORMAL HIGH (ref 70–99)
Potassium: 3.9 mmol/L (ref 3.5–5.1)
Sodium: 141 mmol/L (ref 135–145)

## 2019-05-31 LAB — PROTIME-INR
INR: 2.7 — ABNORMAL HIGH (ref 0.8–1.2)
Prothrombin Time: 28.4 seconds — ABNORMAL HIGH (ref 11.4–15.2)

## 2019-05-31 MED ORDER — FUROSEMIDE 40 MG PO TABS
40.0000 mg | ORAL_TABLET | Freq: Two times a day (BID) | ORAL | Status: DC
  Administered 2019-05-31 – 2019-06-04 (×9): 40 mg
  Filled 2019-05-31 (×9): qty 1

## 2019-05-31 MED ORDER — WARFARIN SODIUM 5 MG PO TABS
10.0000 mg | ORAL_TABLET | Freq: Once | ORAL | Status: AC
  Administered 2019-05-31: 10 mg
  Filled 2019-05-31: qty 2

## 2019-05-31 MED ORDER — LEVOTHYROXINE SODIUM 50 MCG PO TABS
50.0000 ug | ORAL_TABLET | Freq: Every day | ORAL | Status: DC
  Administered 2019-06-01 – 2019-06-04 (×4): 50 ug
  Filled 2019-05-31 (×4): qty 1

## 2019-05-31 NOTE — Progress Notes (Signed)
Speech Language Pathology Daily Session Note  Patient Details  Name: Jon [name] MRN: [id number] Date of Birth: 16-Sep-1965  Today's Date: 05/31/2019 SLP Individual Time: 6384-6659 SLP Individual Time Calculation (min): 50 min  Short Term Goals: Week 3: SLP Short Term Goal 1 (Week 3): Pt will consume current diet with minimal overt s/sx aspiration and Supervision A for use of swallow strategies. SLP Short Term Goal 2 (Week 3): Pt wil consume upgraded trials Dys 2 X2 with efficient mastication and oral clearance X3 prior to solid upgrade. SLP Short Term Goal 3 (Week 3): During therapeutic trials of thin via teaspoon or small ice chips, pt will implement chin tuck strategy with Min A verbal cues. SLP Short Term Goal 4 (Week 3): Pt will utilize speech intelligibility strategies (specifically pausing between words and overarticulation) to increase intelligibility to 70% at the word level with Min A verbal cues. SLP Short Term Goal 5 (Week 3): Pt will demonstrate basic problem solving during functional tasks with Min A verbal/visual cues.  Skilled Therapeutic Interventions: Patient received skilled SLP services targeting dysphagia and speech goals. Patient was repositioned upright in bed and assisted with oral care prior to PO trials. Patient completed PO trials of ice chips x5 and thin liquids via teaspoon x2 while implementing chin tuck. S/sx of aspiration were observed including an immediate throat clear with 1/5 trials of ice chips and a prolonged cough with 2/2 trials of thin liquids via spoon. PO trials of thin liquids discontinued due to prolonged coughing episodes. With trials of dysphagia 2 patient demonstrated inefficient mastication of the bolus requiring max verbal cues to continue to masticate the bolus. Patient required min verbal cues to recall speech intelligibility strategies. Patient responded to biographical -wh questions targeting speech intelligibility requiring mod verbal cues  to slow rate of speech and over-articulate to bring listener understanding to ~50%. At the end of therapy session patient upright in bed, bed alarm activated, and all needs within reach.  Pain Pain Assessment Pain Scale: Faces Pain Score: 0-No pain Faces Pain Scale: No hurt  Therapy/Group: Individual Therapy  [name] 05/31/2019, 10:47 AM

## 2019-05-31 NOTE — Progress Notes (Signed)
ANTICOAGULATION CONSULT NOTE  Pharmacy Consult for Warfarin Indication: stroke and mechanical valve, atrial fibrillation  Allergies  Allergen Reactions  . Hydrocodone-Acetaminophen Other (See Comments)    PT MOTHER DOES NOT REMEMBER REACTION  Only use in Emergency   . Hydromorphone Itching and Rash       . Other Other (See Comments)    ALL NARCOTICS-ITCHING, RASH   . Oxycodone Other (See Comments)    Unknown reaction  . Penicillins Rash    Did it involve swelling of the face/tongue/throat, SOB, or low BP? Unknown Did it involve sudden or severe rash/hives, skin peeling, or any reaction on the inside of your mouth or nose? Unknown Did you need to seek medical attention at a hospital or doctor's office? Unknown When did it last happen?unknown If all above answers are "NO", may proceed with cephalosporin use.  . Dilaudid [Hydromorphone Hcl] Itching and Rash    Patient Measurements: Total Body Weight: 97.2 kg Height: 71 inches  Vital Signs: Temp: 97.9 F (36.6 C) (01/24 0426) Temp Source: Oral (01/24 0426) BP: 100/72 (01/24 0428) Pulse Rate: 62 (01/24 0426)  Labs: Recent Labs    05/29/19 0535 05/30/19 0609 05/31/19 0520  LABPROT 31.5* 30.0* 28.4*  INR 3.1* 2.9* 2.7*  CREATININE  --   --  0.83    Estimated Creatinine Clearance: 124.5 mL/min (by C-G formula based on SCr of 0.83 mg/dL).   Assessment: 54 yr old male presented to the ED with stroke symptoms after recent discharge from [facility]. He is supposed to be on warfarin and Lovenox for history of a mechanical valve.  Patient is s/p thrombectomy and s/p bridging with Heparin to therapeutic INR with warfarin. Pharmacy consulted to dose.   INR continues to be therapeutic at 2.7 this AM, no bleeding reported, previous CBCs have been stable.     Goal of Therapy:  INR 2.5-3.5  Monitor platelets by anticoagulation protocol: Yes   Plan:  Give warfarin 10 mg PO x 1 today Daily INR, s/s  bleeding  Domenic Moras, PharmD PGY1 Ambulatory Care Pharmacy Resident 05/31/2019 7:09 AM

## 2019-05-31 NOTE — Progress Notes (Signed)
[place] PHYSICAL MEDICINE & REHABILITATION PROGRESS NOTE  Subjective/Complaints: Lying in bed. Indicates no new problems  ROS: Limited due to cognitive/behavioral/speech   Objective: Vital Signs: Blood pressure 100/72, pulse 62, temperature 97.9 F (36.6 C), temperature source Oral, resp. rate 19, height 5\' 11"  (1.803 m), weight 100.7 kg, SpO2 95 %. No results found. No results for input(s): WBC, HGB, HCT, PLT in the last 72 hours. Recent Labs    05/31/19 0520  NA 141  K 3.9  CL 104  CO2 26  GLUCOSE 127*  BUN 16  CREATININE 0.83  CALCIUM 8.8*    Physical Exam: BP 100/72   Pulse 62   Temp 97.9 F (36.6 C) (Oral)   Resp 19   Ht 5\' 11"  (1.803 m)   Wt 100.7 kg   SpO2 95%   BMI 30.96 kg/m  Constitutional: No distress . Vital signs reviewed. HEENT: EOMI, oral membranes moist Neck: supple Cardiovascular: RRR without murmur. No JVD    Respiratory: CTA Bilaterally without wheezes or rales. Normal effort    GI: BS +, non-tender, non-distended  Skin: Warm and dry.  Intact. Psych: Normal mood.  Normal behavior. Musc: No edema in extremities.  No tenderness in extremities. Neurological: Alert, fixed upward gaze seems less Severe dysarthria Able to follow simple motor commands.  Motor:  RUE: 4-4+/5 proximal distal, stable RLE: Hip flexion, knee extension 4 +/5, dorsiflexion 4+/5, stable  Assessment/Plan: 1. 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  Physiatrist is providing close team supervision and 24 hour management of active medical problems listed below.  Physiatrist and rehab team continue to assess barriers to discharge/monitor patient progress toward functional and medical goals  Care Tool:  Bathing    Body parts bathed by patient: Right arm, Left lower leg, Left arm, Chest, Face, Abdomen, Front perineal area, Right upper  leg, Left upper leg   Body parts bathed by helper: Buttocks, Right lower leg     Bathing assist Assist Level: Minimal Assistance - Patient > 75%     Upper Body Dressing/Undressing Upper body dressing   What is the patient wearing?: Pull over shirt    Upper body assist Assist Level: Moderate Assistance - Patient 50 - 74%    Lower Body Dressing/Undressing Lower body dressing      What is the patient wearing?: Pants, Underwear/pull up     Lower body assist Assist for lower body dressing: Minimal Assistance - Patient > 75%     Toileting Toileting    Toileting assist Assist for toileting: Minimal Assistance - Patient > 75%     Transfers Chair/bed transfer  Transfers assist     Chair/bed transfer assist level: Minimal Assistance - Patient > 75%     Locomotion Ambulation   Ambulation assist   Ambulation activity did not occur: Safety/medical concerns(fatigue, LE weakness, restricted to threapy in room due to IV fluids)  Assist level: 2 helpers Assistive device: Walker-rolling Max distance: 157ft   Walk 10 feet activity   Assist  Walk 10 feet activity did not occur: Safety/medical concerns(fatigue, LE weakness, restricted to threapy in room due to IV fluids)  Assist level: 2 helpers Assistive device: Walker-rolling   Walk 50 feet activity   Assist Walk 50 feet with 2 turns activity did not occur: Safety/medical concerns(fatigue, LE weakness, restricted to threapy in room due to IV fluids)  Assist level: 2 helpers Assistive  device: Walker-rolling    Walk 150 feet activity   Assist Walk 150 feet activity did not occur: Safety/medical concerns(fatigue, LE weakness, restricted to threapy in room due to IV fluids)  Assist level: 2 helpers Assistive device: Walker-rolling    Walk 10 feet on uneven surface  activity   Assist Walk 10 feet on uneven surfaces activity did not occur: Safety/medical concerns(fatigue, LE weakness, restricted to threapy in  room due to IV fluids)         Wheelchair     Assist Will patient use wheelchair at discharge?: Yes Type of Wheelchair: Manual Wheelchair activity did not occur: Safety/medical concerns(fatigue, R hemi, restricted to threapy in room due to IV fluids)  Wheelchair assist level: Supervision/Verbal cueing Max wheelchair distance: 15ft    Wheelchair 50 feet with 2 turns activity    Assist    Wheelchair 50 feet with 2 turns activity did not occur: Safety/medical concerns(fatigue, R hemi, restricted to threapy in room due to IV fluids)   Assist Level: Supervision/Verbal cueing   Wheelchair 150 feet activity     Assist Wheelchair 150 feet activity did not occur: Safety/medical concerns(fatigue, R hemi, restricted to threapy in room due to IV fluids)   Assist Level: Supervision/Verbal cueing      Medical Problem List and Plan: 1.  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  Continue CIR 2.  Antithrombotics: -DVT/anticoagulation:  Pharmaceutical: Coumadin goal 2.5-3.5.   INR 2.7 1/24  -antiplatelet therapy: on ASA 3. Pain Management: tylenol prn 4. Mood: LCSW to follow for evaluation and support when appropriate.              -antipsychotic agents: N/A 5. Neuropsych: This patient is not fully capable of making decisions on his own behalf. 6. Skin/Wound Care: Routine pressure relief measures.  7. Fluids/Electrolytes/Nutrition: Monitor electrolytes 8. Aspiration PNA: Completed 7 day course of Unasyn on 1/12. Needs to be upright with pulmonary toilet.    Wean supplemental oxygen as tolerated 9. CAF/CHB s/p PPM:   Monitor HR Off amiodarone and Entresto due to issues with hypotension.  11 COPD/OSA: Duonebs and incruse resumed. Encourage IS and keep HOB>30 degrees.   Communicated medication adjustments with respiratory therapy 12. Depression/anxiety  disorder: Emotional support 13. H/o migraines: Has been managed on Topamax and gabapentin.  14. Hypokalemia:   Kdur DC'd on 1/11  1/24 Potassium 3.9 today  Continue to monitor labs 15.  Hyperglycemia-secondary to tube feeds  Relatively controlled on 1/24  Continue SSI as needed    16.  Leukocytosis: Resolved  WBCs 9.0 on 1/13  Afebrile  Continue to monitor 17.  Acute blood loss anemia  Hemoglobin 11.4 on 1/18, labs ordered for Monday  Continue to monitor 18.  Hypoalbuminemia  Adjust with tube feeds 19.  Acute on chronic combined congestive heart failure  EF 25-30%  Daily weights  No sign of fluid overload at this time, continue to monitor [facility] Weights   05/29/19 0409 05/30/19 0425 05/31/19 0426  Weight: 101.3 kg 102.6 kg 100.7 kg   Lasix 20 daily started on 1/19, increased on 1/20  Free water flushes decreased on 1/22---hold on 1/23  -pt is on 40mg  am and 20mg  pm lasix at home  1/23 Further weight increase today--increase lasix to 40mg  bid for now   -recheck bmet in am  1/24 weight down to 100.7 today, continue bid lasix   -reassess in AM 20.  Loose  stools  Discussed changing tube feeds with dietary  KUB reviewed, unremarkable  Improved after completion of abx 21.  Post stroke dysphagia  Continue tube feeds  Advance D1 pudding  Continue to advance as tolerated      LOS: 18 days A FACE TO FACE EVALUATION WAS PERFORMED  Meredith Staggers 05/31/2019, 10:25 AM

## 2019-06-01 ENCOUNTER — Inpatient Hospital Stay (HOSPITAL_COMMUNITY): Payer: Medicaid Other | Admitting: Occupational Therapy

## 2019-06-01 ENCOUNTER — Inpatient Hospital Stay (HOSPITAL_COMMUNITY): Payer: Medicaid Other

## 2019-06-01 ENCOUNTER — Inpatient Hospital Stay (HOSPITAL_COMMUNITY): Payer: Medicaid Other | Admitting: Speech Pathology

## 2019-06-01 LAB — BASIC METABOLIC PANEL
Anion gap: 11 (ref 5–15)
BUN: 15 mg/dL (ref 6–20)
CO2: 28 mmol/L (ref 22–32)
Calcium: 9.1 mg/dL (ref 8.9–10.3)
Chloride: 100 mmol/L (ref 98–111)
Creatinine, Ser: 0.95 mg/dL (ref 0.61–1.24)
GFR calc Af Amer: >60 mL/min (ref >60)
GFR calc non Af Amer: >60 mL/min (ref >60)
Glucose, Bld: 129 mg/dL — ABNORMAL HIGH (ref 70–99)
Potassium: 3.8 mmol/L (ref 3.5–5.1)
Sodium: 139 mmol/L (ref 135–145)

## 2019-06-01 LAB — CBC
HCT: 37.5 % — ABNORMAL LOW (ref 39.0–52.0)
Hemoglobin: 12 g/dL — ABNORMAL LOW (ref 13.0–17.0)
MCH: 29 pg (ref 26.0–34.0)
MCHC: 32 g/dL (ref 30.0–36.0)
MCV: 90.6 fL (ref 80.0–100.0)
Platelets: 382 10*3/uL (ref 150–400)
RBC: 4.14 MIL/uL — ABNORMAL LOW (ref 4.22–5.81)
RDW: 17.2 % — ABNORMAL HIGH (ref 11.5–15.5)
WBC: 6.6 10*3/uL (ref 4.0–10.5)
nRBC: 0 % (ref 0.0–0.2)

## 2019-06-01 LAB — GLUCOSE, CAPILLARY
Glucose-Capillary: 120 mg/dL — ABNORMAL HIGH (ref 70–99)
Glucose-Capillary: 133 mg/dL — ABNORMAL HIGH (ref 70–99)
Glucose-Capillary: 136 mg/dL — ABNORMAL HIGH (ref 70–99)
Glucose-Capillary: 144 mg/dL — ABNORMAL HIGH (ref 70–99)
Glucose-Capillary: 74 mg/dL (ref 70–99)
Glucose-Capillary: 81 mg/dL (ref 70–99)

## 2019-06-01 LAB — PROTIME-INR
INR: 2.8 — ABNORMAL HIGH (ref 0.8–1.2)
Prothrombin Time: 29.1 seconds — ABNORMAL HIGH (ref 11.4–15.2)

## 2019-06-01 MED ORDER — WARFARIN SODIUM 7.5 MG PO TABS
7.5000 mg | ORAL_TABLET | Freq: Once | ORAL | Status: AC
  Administered 2019-06-01: 7.5 mg via ORAL
  Filled 2019-06-01: qty 1

## 2019-06-01 NOTE — Progress Notes (Signed)
Speech Language Pathology Daily Session Note  Patient Details  Name: Jon [name] MRN: [id number] Date of Birth: [birth year]  Today's Date: 06/01/2019 SLP Individual Time: 6720-9470 SLP Individual Time Calculation (min): 63 min  Short Term Goals: Week 3: SLP Short Term Goal 1 (Week 3): Pt will consume current diet with minimal overt s/sx aspiration and Supervision A for use of swallow strategies. SLP Short Term Goal 2 (Week 3): Pt wil consume upgraded trials Dys 2 X2 with efficient mastication and oral clearance X3 prior to solid upgrade. SLP Short Term Goal 3 (Week 3): During therapeutic trials of thin via teaspoon or small ice chips, pt will implement chin tuck strategy with Min A verbal cues. SLP Short Term Goal 4 (Week 3): Pt will utilize speech intelligibility strategies (specifically pausing between words and overarticulation) to increase intelligibility to 70% at the word level with Min A verbal cues. SLP Short Term Goal 5 (Week 3): Pt will demonstrate basic problem solving during functional tasks with Min A verbal/visual cues.  Skilled Therapeutic Interventions: Pt was seen for skilled ST targeting dysphagia and cognitive goals. SLP facilitated session with overall Mod A for thoroughness of oral care via suction, followed by trials of thin H2O. Pt exhibited throat clear in 1/2 ice chip trials, and immediate cough in 2/10 thin H2O via teaspoon. Pt required 2 verbal cues to implement chin tuck during intake, and 1/2 coughing episodes occurred when pt swallowed before performing chin tuck maneuver. Pt did demonstrate good insight/awareness that he forgot to implement chin tuck soon enough prior to any feedback form SLP. Pt also accepted ~6oz pudding thick gingerale without any overt s/sx aspiration and Min A verbal cues for use of slow rate. Pt eventually became too fatigued to self-feed, therefore SLP took over.  SLP further facilitated session with extra time and Min A verbal cues for  error awareness and problem solving during a semi-complex money scenario worksheet. Pt with improved accuracy with use of calculator, however Min A verbal cues required for pt to efficiently problem solve its use. Pt with improved ability to self-monitor need to slow down during structured cognitive tasks, however still very impulsive during other functional tasks such as repositioning, PO intake, etc. Pt left laying in bed with alarm set and needs within reach. Continue per current plan of care.       Pain Pain Assessment Pain Scale: 0-10 Pain Score: 0-No pain  Therapy/Group: Individual Therapy  [name] 06/01/2019, 9:50 AM

## 2019-06-01 NOTE — Progress Notes (Signed)
Physical Therapy Session Note  Patient Details  Name: Jon [name] MRN: [id number] Date of Birth: 15-Dec-1965  Today's Date: 06/01/2019 PT Individual Time: 1400-1500 PT Individual Time Calculation (min): 60 min   Short Term Goals: Week 3:  PT Short Term Goal 1 (Week 3): Pt will perform stand<>pivot with LRAD CGA PT Short Term Goal 2 (Week 3): Pt will perform car transfer with LRAD min A PT Short Term Goal 3 (Week 3): Pt will ambulate 138ft with LRAD min A  Skilled Therapeutic Interventions/Progress Updates:    Extra time and encouragement for patient to don socks with PT ultimately assisting with starting sock and pt pulling up the rest of the way in semi reclined position and then seated EOB with figure 4 position. Pt with 2 episodes of LOB requiring mod assist to recover due to poor awareness and delayed balance reactions. Assisted with shoes for time management. Min assist stand step transfer with RW to w/c. Pt on 1L O2 but worked during therapy session on room air with sats >94% and HR ~ 70 bpm with activity. Cues for breathing technique to decrease work of breathing, but vitals remained WFL. Pt impulsive throughout session and requires cues for safety and education. Functional gait training x 150' with min assist overall (longer distance had w/c follow for safety due to impulsive nature and increasing fatigue in BLE) with mod cues for positioning of RW, upright trunk posture, and increased foot clearance (pt with tendency for shuffled gait pattern and flexed through trunk, and knees). Pt is able to correct with cues but as fatigued, returns to slumped position and decreased safety with turning noted. Stair negotiation for home entry practice with bilateral rails and min assist overall for controlling descent (CGA for ascending). NMR for balance retraining in standing while performing ball toss to rebounder without UE support and focus on RUE coordination and control with min assist for  balance. End of session set up in w/c with RN to provide thickened drink.   Therapy Documentation Precautions:  Precautions Precautions: Fall Precaution Comments: R hemi, dysarthria, dysphagia, on .5L O2 via [place], NG tube Restrictions Weight Bearing Restrictions: No  Pain:  No reports of pain.   Therapy/Group: Individual Therapy  Karolee Stamps Darrol Poke, PT, DPT, CBIS  06/01/2019, 3:25 PM

## 2019-06-01 NOTE — Progress Notes (Signed)
Occupational Therapy Session Note  Patient Details  Name: Jon [name] MRN: [id number] Date of Birth: [birth year]  Today's Date: 06/01/2019 OT Individual Time: 1105-1200 OT Individual Time Calculation (min): 55 min    Short Term Goals: Week 3:  OT Short Term Goal 1 (Week 3): Pt will complete toilet transfers with min assist ambulating with RW OT Short Term Goal 2 (Week 3): Pt will complete LB dressing to include donning socks/shoes with min assist OT Short Term Goal 3 (Week 3): Pt will complete 2 grooming tasks in standing with min assist for increased standing tolerance/balance  Skilled Therapeutic Interventions/Progress Updates:    Treatment session with focus on functional transfers, dynamic standing balance, and functional use of RUE during self-care tasks.  Pt received in bed, agreeable to shower.  Pt completed bed mobility supervision and sit> stand to RW with min assist.  Pt ambulated to bathroom with RW with min assist and cues for attention to RLE during mobility.  Pt completed toilet transfer and tub bench transfer with RW with min assist and mod cues due to impulsivity.  Pt completed bathing at sit > stand level with min assist to wash buttocks and hand over hand assist initially to secure wash cloth when in use with RUE.  Pt often dropping cloth without awareness, requiring cues and setup to use cloth.  Pt completed oral care with mod cues to not swallow water.  Therapist assisted with grooming to apply toothpaste and brush hair.  Pt donned clothing with CGA for standing balance when attempting to pull pants over hips, ultimately requiring assistance when pulling pants over Rt hip.  Pt donned shirt with increased time with min assist.  Returned to bed stand pivot with CGA.  Pt left semi-reclined in bed with all needs in reach.  O2 sats maintained in mid 90s on 1L supplemental O2 during activity.  Therapy Documentation Precautions:  Precautions Precautions: Fall Precaution  Comments: R hemi, dysarthria, dysphagia, on .5L O2 via [place], NG tube, and NPO Restrictions Weight Bearing Restrictions: No General:   Vital Signs: Oxygen Therapy SpO2: 96 % O2 Device: Nasal Cannula O2 Flow Rate (L/min): 1 L/min Pain: Pain Assessment Pain Scale: 0-10 Pain Score: 0-No pain   Therapy/Group: Individual Therapy  Rosalio Loud 06/01/2019, 12:21 PM

## 2019-06-01 NOTE — Progress Notes (Signed)
PHYSICAL MEDICINE & REHABILITATION PROGRESS NOTE  Subjective/Complaints: "Doing alright." Denies pain. Had BM this morning as per aide at bedside.  Sleeping well at night.   ROS: Limited due to cognitive/behavioral/speech   Objective: Vital Signs: Blood pressure 114/78, pulse 64, temperature 97.7 F (36.5 C), temperature source Oral, resp. rate 18, height 5\' 11"  (1.803 m), weight 102.7 kg, SpO2 96 %. No results found. Recent Labs    06/01/19 0711  WBC 6.6  HGB 12.0*  HCT 37.5*  PLT 382   Recent Labs    05/31/19 0520 06/01/19 0711  NA 141 139  K 3.9 3.8  CL 104 100  CO2 26 28  GLUCOSE 127* 129*  BUN 16 15  CREATININE 0.83 0.95  CALCIUM 8.8* 9.1    Physical Exam: BP 114/78 (BP Location: Left Arm)   Pulse 64   Temp 97.7 F (36.5 C) (Oral)   Resp 18   Ht 5\' 11"  (1.803 m)   Wt 102.7 kg   SpO2 96%   BMI 31.58 kg/m  Constitutional: No distress . Vital signs reviewed. HEENT: EOMI, oral membranes moist Neck: supple Cardiovascular: RRR without murmur. No JVD    Respiratory: CTA Bilaterally without wheezes or rales. Normal effort    GI: BS +, non-tender, non-distended  Skin: Warm and dry.  Intact. Psych: Normal mood.  Normal behavior. Musc: No edema in extremities.  No tenderness in extremities. Neurological: Alert, fixed upward gaze seems less Severe dysarthria Able to follow simple motor commands.  Motor:  RUE: 4-4+/5 proximal distal, stable RLE: Hip flexion, knee extension 4 +/5, dorsiflexion 4+/5, stable  Assessment/Plan: 1. 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  Physiatrist is providing close team supervision and 24 hour management of active medical problems listed below.  Physiatrist and rehab team continue to assess barriers to discharge/monitor patient progress toward functional and medical  goals  Care Tool:  Bathing    Body parts bathed by patient: Right arm, Left lower leg, Left arm, Chest, Face, Abdomen, Front perineal area, Right upper leg, Left upper leg   Body parts bathed by helper: Buttocks, Right lower leg     Bathing assist Assist Level: Minimal Assistance - Patient > 75%     Upper Body Dressing/Undressing Upper body dressing   What is the patient wearing?: Pull over shirt    Upper body assist Assist Level: Moderate Assistance - Patient 50 - 74%    Lower Body Dressing/Undressing Lower body dressing      What is the patient wearing?: Pants, Underwear/pull up     Lower body assist Assist for lower body dressing: Minimal Assistance - Patient > 75%     Toileting Toileting    Toileting assist Assist for toileting: Minimal Assistance - Patient > 75%     Transfers Chair/bed transfer  Transfers assist     Chair/bed transfer assist level: Minimal Assistance - Patient > 75%     Locomotion Ambulation   Ambulation assist   Ambulation activity did not occur: Safety/medical concerns(fatigue, LE weakness, restricted to threapy in room due to IV fluids)  Assist level: 2 helpers Assistive device: Walker-rolling Max distance: 166ft   Walk 10 feet activity   Assist  Walk 10 feet activity did not occur: Safety/medical concerns(fatigue, LE weakness, restricted to threapy in room due to IV fluids)  Assist level: 2 helpers Assistive device: Walker-rolling   Walk 50  feet activity   Assist Walk 50 feet with 2 turns activity did not occur: Safety/medical concerns(fatigue, LE weakness, restricted to threapy in room due to IV fluids)  Assist level: 2 helpers Assistive device: Walker-rolling    Walk 150 feet activity   Assist Walk 150 feet activity did not occur: Safety/medical concerns(fatigue, LE weakness, restricted to threapy in room due to IV fluids)  Assist level: 2 helpers Assistive device: Walker-rolling    Walk 10 feet on uneven  surface  activity   Assist Walk 10 feet on uneven surfaces activity did not occur: Safety/medical concerns(fatigue, LE weakness, restricted to threapy in room due to IV fluids)         Wheelchair     Assist Will patient use wheelchair at discharge?: Yes Type of Wheelchair: Manual Wheelchair activity did not occur: Safety/medical concerns(fatigue, R hemi, restricted to threapy in room due to IV fluids)  Wheelchair assist level: Supervision/Verbal cueing Max wheelchair distance: 123ft    Wheelchair 50 feet with 2 turns activity    Assist    Wheelchair 50 feet with 2 turns activity did not occur: Safety/medical concerns(fatigue, R hemi, restricted to threapy in room due to IV fluids)   Assist Level: Supervision/Verbal cueing   Wheelchair 150 feet activity     Assist Wheelchair 150 feet activity did not occur: Safety/medical concerns(fatigue, R hemi, restricted to threapy in room due to IV fluids)   Assist Level: Supervision/Verbal cueing      Medical Problem List and Plan: 1.  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  Continue CIR 2.  Antithrombotics: -DVT/anticoagulation:  Pharmaceutical: Coumadin goal 2.5-3.5.   INR 2.7 1/24   INR 2.8 on 1/25  -antiplatelet therapy: on ASA 3. Pain Management: tylenol prn 4. Mood: LCSW to follow for evaluation and support when appropriate.              -antipsychotic agents: N/A 5. Neuropsych: This patient is not fully capable of making decisions on his own behalf. 6. Skin/Wound Care: Routine pressure relief measures.  7. Fluids/Electrolytes/Nutrition: Monitor electrolytes 8. Aspiration PNA: Completed 7 day course of Unasyn on 1/12. Needs to be upright with pulmonary toilet.    Wean supplemental oxygen as tolerated 9. CAF/CHB s/p PPM:   Monitor HR Off amiodarone and Entresto due to issues with  hypotension.  11 COPD/OSA: Duonebs and incruse resumed. Encourage IS and keep HOB>30 degrees.   Communicated medication adjustments with respiratory therapy 12. Depression/anxiety disorder: Emotional support 13. H/o migraines: Has been managed on Topamax and gabapentin.  14. Hypokalemia:   Kdur DC'd on 1/11  1/24 Potassium 3.9 today  1/25 K+ 3.8  Continue to monitor labs 15.  Hyperglycemia-secondary to tube feeds  Relatively controlled on 1/24, 1/25  Continue SSI as needed 16.  Leukocytosis: Resolved  WBCs 9.0 on 1/13  Afebrile  Continue to monitor 17.  Acute blood loss anemia  Hemoglobin 11.4 on 1/18, 12.0 on 1/25.   Continue to monitor 18.  Hypoalbuminemia  Adjust with tube feeds 19.  Acute on chronic combined congestive heart failure  EF 25-30%  Daily weights  No sign of fluid overload at this time, continue to monitor Filed Weights   05/30/19 0425 05/31/19 0426 06/01/19 0500  Weight: 102.6 kg 100.7 kg 102.7 kg   Lasix 20 daily started on 1/19, increased on 1/20  Free water flushes decreased on 1/22---hold on 1/23  -pt is on 40mg  am  and 20mg  pm lasix at home  1/23 Further weight increase today--increase lasix to 40mg  bid for now   -recheck bmet in am  1/24 weight down to 100.7 today, continue bid lasix   -reassess in AM  1/25: weight back to 102.7kg, similar to 1/23, will maintain current Lasix dose and consider increase if continues to elevate.  20.  Loose stools  Discussed changing tube feeds with dietary  KUB reviewed, unremarkable  Improved after completion of abx 21.  Post stroke dysphagia  Continue tube feeds  Advance D1 pudding  Continue to advance as tolerated      LOS: 19 days A FACE TO FACE EVALUATION WAS PERFORMED  Clide Deutscher Ardian Haberland 06/01/2019, 9:38 AM

## 2019-06-01 NOTE — Progress Notes (Signed)
ANTICOAGULATION CONSULT NOTE  Pharmacy Consult for Warfarin Indication: stroke and mechanical valve, atrial fibrillation  Allergies  Allergen Reactions  . Hydrocodone-Acetaminophen Other (See Comments)    PT MOTHER DOES NOT REMEMBER REACTION  Only use in Emergency   . Hydromorphone Itching and Rash       . Other Other (See Comments)    ALL NARCOTICS-ITCHING, RASH   . Oxycodone Other (See Comments)    Unknown reaction  . Penicillins Rash    Did it involve swelling of the face/tongue/throat, SOB, or low BP? Unknown Did it involve sudden or severe rash/hives, skin peeling, or any reaction on the inside of your mouth or nose? Unknown Did you need to seek medical attention at a hospital or doctor's office? Unknown When did it last happen?unknown If all above answers are "NO", may proceed with cephalosporin use.  . Dilaudid [Hydromorphone Hcl] Itching and Rash    Patient Measurements: Total Body Weight: 97.2 kg Height: 71 inches  Vital Signs: Temp: 97.7 F (36.5 C) (01/25 0352) Temp Source: Oral (01/25 0352) BP: 114/78 (01/25 0352) Pulse Rate: 64 (01/25 0352)  Labs: Recent Labs    05/30/19 0609 05/31/19 0520 06/01/19 0711  HGB  --   --  12.0*  HCT  --   --  37.5*  PLT  --   --  382  LABPROT 30.0* 28.4* 29.1*  INR 2.9* 2.7* 2.8*  CREATININE  --  0.83 0.95    Estimated Creatinine Clearance: 109.8 mL/min (by C-G formula based on SCr of 0.95 mg/dL).   Assessment: 54 yr old male presented to the ED with stroke symptoms after recent discharge from [facility]. He is supposed to be on warfarin and Lovenox for history of a mechanical valve.  Patient is s/p thrombectomy and s/p bridging with Heparin to therapeutic INR with warfarin. Pharmacy consulted to dose.   INR continues to be therapeutic at 2.8 this AM, no bleeding reported, previous CBCs have been stable.     Goal of Therapy:  INR 2.5-3.5  Monitor platelets by anticoagulation protocol: Yes   Plan:   Warfarin 7.5 mg po x 1 dose tonight Daily INR, s/s bleeding  Thank you Okey Regal, PharmD 06/01/2019 9:23 AM

## 2019-06-02 ENCOUNTER — Inpatient Hospital Stay (HOSPITAL_COMMUNITY): Payer: Medicaid Other | Admitting: Physical Therapy

## 2019-06-02 ENCOUNTER — Inpatient Hospital Stay (HOSPITAL_COMMUNITY): Payer: Medicaid Other | Admitting: Occupational Therapy

## 2019-06-02 ENCOUNTER — Inpatient Hospital Stay (HOSPITAL_COMMUNITY): Payer: Medicaid Other | Admitting: Speech Pathology

## 2019-06-02 ENCOUNTER — Inpatient Hospital Stay (HOSPITAL_COMMUNITY): Payer: Medicaid Other

## 2019-06-02 LAB — GLUCOSE, CAPILLARY
Glucose-Capillary: 120 mg/dL — ABNORMAL HIGH (ref 70–99)
Glucose-Capillary: 132 mg/dL — ABNORMAL HIGH (ref 70–99)
Glucose-Capillary: 77 mg/dL (ref 70–99)
Glucose-Capillary: 81 mg/dL (ref 70–99)
Glucose-Capillary: 88 mg/dL (ref 70–99)
Glucose-Capillary: 94 mg/dL (ref 70–99)

## 2019-06-02 LAB — PROTIME-INR
INR: 2.9 — ABNORMAL HIGH (ref 0.8–1.2)
Prothrombin Time: 30 seconds — ABNORMAL HIGH (ref 11.4–15.2)

## 2019-06-02 MED ORDER — WARFARIN SODIUM 7.5 MG PO TABS
7.5000 mg | ORAL_TABLET | Freq: Once | ORAL | Status: AC
  Administered 2019-06-02: 7.5 mg via ORAL
  Filled 2019-06-02: qty 1

## 2019-06-02 NOTE — Progress Notes (Signed)
Speech Language Pathology Daily Session Note  Patient Details  Name: Elisandro Jarrett MRN: [id number] Date of Birth: 10-25-1965  Today's Date: 06/02/2019 SLP Individual Time: 1029-1100 SLP Individual Time Calculation (min): 31 min  Short Term Goals: Week 3: SLP Short Term Goal 1 (Week 3): Pt will consume current diet with minimal overt s/sx aspiration and Supervision A for use of swallow strategies. SLP Short Term Goal 2 (Week 3): Pt wil consume upgraded trials Dys 2 X2 with efficient mastication and oral clearance X3 prior to solid upgrade. SLP Short Term Goal 3 (Week 3): During therapeutic trials of thin via teaspoon or small ice chips, pt will implement chin tuck strategy with Min A verbal cues. SLP Short Term Goal 4 (Week 3): Pt will utilize speech intelligibility strategies (specifically pausing between words and overarticulation) to increase intelligibility to 70% at the word level with Min A verbal cues. SLP Short Term Goal 5 (Week 3): Pt will demonstrate basic problem solving during functional tasks with Min A verbal/visual cues.  Skilled Therapeutic Interventions: Pt was seen for skilled ST targeting dysphagia and speech goals. Following thorough oral care via suction toothbrush, SLP provided upgraded trials of thin H2O. Pt exhibited immediate cough in 3/4 teaspoon or cup sips of thin H2O and immediate cough in 1/3 small ice chips. Pt appears to have better oral control of thin when presented as ice chip rather than liquid teaspoon bolus. Hand over hand assist to control bolus size was provided during cup sip attempt at pt's request - cup sip did not decrease cough response to thin. Min A verbal cues required for implementation of chin tuck throughout intake, however pt reported even when he performs chin tuck he has difficulty waiting to swallow until chin is fully tucked. Upgraded Dys 2 solid trial also provided, during which prolonged mastication and min oral residue was noted. Continue  current diet. During phrase level speech tasks, pt required increased Max A for repeating messages using slow rate, overarticulation, and at times increased vocal intensity. Pt's affect was visibly different and he reported fatigued, which was believed to contribute to need for increased cueing and decreased overall intelligibility today (~50% phrase level). Pt left laying in bed with alarm set and needs within reach. Continue per current plan of care.     Pain Pain Assessment Pain Scale: 0-10 Pain Score: 0-No pain  Therapy/Group: Individual Therapy  [name] 06/02/2019, 11:05 AM

## 2019-06-02 NOTE — Plan of Care (Signed)
  Problem: RH Balance Goal: LTG Patient will maintain dynamic standing with ADLs (OT) Description: LTG:  Patient will maintain dynamic standing balance with assist during activities of daily living (OT)  Flowsheets (Taken 06/02/2019 1513) LTG: Pt will maintain dynamic standing balance during ADLs with: (downgraded) Contact Guard/Touching assist Note: Downgraded to CGA due to pt decreased safety awareness and impulsivity   Problem: Sit to Stand Goal: LTG:  Patient will perform sit to stand in prep for activites of daily living with assistance level (OT) Description: LTG:  Patient will perform sit to stand in prep for activites of daily living with assistance level (OT) Flowsheets (Taken 06/02/2019 1513) LTG: PT will perform sit to stand in prep for activites of daily living with assistance level: (downgraded) Contact Guard/Touching assist Note: Downgraded to CGA due to pt decreased safety awareness and impulsivity   Problem: RH Bathing Goal: LTG Patient will bathe all body parts with assist levels (OT) Description: LTG: Patient will bathe all body parts with assist levels (OT) Flowsheets (Taken 06/02/2019 1513) LTG: Pt will perform bathing with assistance level/cueing: (downgraded) Contact Guard/Touching assist Note: Downgraded to CGA due to pt decreased safety awareness and impulsivity   Problem: RH Dressing Goal: LTG Patient will perform lower body dressing w/assist (OT) Description: LTG: Patient will perform lower body dressing with assist, with/without cues in positioning using equipment (OT) Flowsheets (Taken 06/02/2019 1513) LTG: Pt will perform lower body dressing with assistance level of: (downgraded) Minimal Assistance - Patient > 75% Note: Downgraded to CGA due to pt decreased safety awareness and impulsivity.  Pt will also require assistance to don socks/shoes   Problem: RH Toileting Goal: LTG Patient will perform toileting task (3/3 steps) with assistance level (OT) Description:  LTG: Patient will perform toileting task (3/3 steps) with assistance level (OT)  Flowsheets (Taken 06/02/2019 1513) LTG: Pt will perform toileting task (3/3 steps) with assistance level: (downgraded) Contact Guard/Touching assist Note: Downgraded to CGA due to pt decreased safety awareness and impulsivity   Problem: RH Functional Use of Upper Extremity Goal: LTG Patient will use RT/LT upper extremity as a (OT) Description: LTG: Patient will use right/left upper extremity as a stabilizer/gross assist/diminished/nondominant/dominant level with assist, with/without cues during functional activity (OT) Flowsheets (Taken 06/02/2019 1513) LTG: Use of upper extremity in functional activities: (downgraded) RUE as diminished level   Problem: RH Toilet Transfers Goal: LTG Patient will perform toilet transfers w/assist (OT) Description: LTG: Patient will perform toilet transfers with assist, with/without cues using equipment (OT) Flowsheets (Taken 06/02/2019 1513) LTG: Pt will perform toilet transfers with assistance level of: (downgraded) Contact Guard/Touching assist Note: Downgraded to CGA due to pt decreased safety awareness and impulsivity   Problem: RH Tub/Shower Transfers Goal: LTG Patient will perform tub/shower transfers w/assist (OT) Description: LTG: Patient will perform tub/shower transfers with assist, with/without cues using equipment (OT) Flowsheets (Taken 06/02/2019 1513) LTG: Pt will perform tub/shower stall transfers with assistance level of: (downgraded) Contact Guard/Touching assist Note: Downgraded to CGA due to pt decreased safety awareness and impulsivity

## 2019-06-02 NOTE — Progress Notes (Signed)
Occupational Therapy Session Note  Patient Details  Name: Jon [name] MRN: [id number] Date of Birth: 12-14-1965  Today's Date: 06/02/2019 OT Individual Time: 1405-1500 OT Individual Time Calculation (min): 55 min    Short Term Goals: Week 3:  OT Short Term Goal 1 (Week 3): Pt will complete toilet transfers with min assist ambulating with RW OT Short Term Goal 2 (Week 3): Pt will complete LB dressing to include donning socks/shoes with min assist OT Short Term Goal 3 (Week 3): Pt will complete 2 grooming tasks in standing with min assist for increased standing tolerance/balance  Skilled Therapeutic Interventions/Progress Updates:    Treatment session with focus on functional transfers, dynamic standing balance, and functional use of dominant RUE during self-care tasks.  Pt received upright in w/c requesting shower this session.  Pt ambulated to bathroom with RW with Min assist and min cues for sequencing and safety when navigating threshold in to bathroom.  Pt completed toileting tasks with CGA for standing balance.  Ambulated short distance without AD, utilizing grab bars to transfer from toilet to shower.  Completed bathing with CGA for standing balance and hand over hand for setup when washing Lt arm with RUE.  Pt able to complete once setup.  Pt dropped cloth when attempting to wash buttocks with Rt hand - would benefit from wash mitt to maintain hold on washcloth.  Ambulated back out of bathroom with RW with min assist.  Completed dressing at sit > stand level with min assist for LB dressing to pull pants over Rt hip.  Educated on increased use of LUE when completing LB dressing.  Setup assist and backward chaining when donning socks, ultimately requiring assistance to don shoes as well.  Remained on room air throughout session. Pt 95-100% on room air during dressing tasks.  Dropped to 85% after shower, but able to return to 93% in 1-2 mins with cues for breathing technique.  Therapy  Documentation Precautions:  Precautions Precautions: Fall Precaution Comments: R hemi, dysarthria, dysphagia, on .5L O2 via [place], NG tube, and NPO Restrictions Weight Bearing Restrictions: No General:   Vital Signs: Therapy Vitals Temp: 97.7 F (36.5 C) Pulse Rate: 60 Resp: 18 BP: 118/75 Patient Position (if appropriate): Sitting Oxygen Therapy SpO2: 98 % O2 Device: Room Air Pain:  Pt with no c/o pain   Therapy/Group: Individual Therapy  Rosalio Loud 06/02/2019, 3:17 PM

## 2019-06-02 NOTE — Progress Notes (Signed)
ANTICOAGULATION CONSULT NOTE  Pharmacy Consult for Warfarin Indication: stroke and mechanical valve, atrial fibrillation  Allergies  Allergen Reactions  . Hydrocodone-Acetaminophen Other (See Comments)    PT MOTHER DOES NOT REMEMBER REACTION  Only use in Emergency   . Hydromorphone Itching and Rash       . Other Other (See Comments)    ALL NARCOTICS-ITCHING, RASH   . Oxycodone Other (See Comments)    Unknown reaction  . Penicillins Rash    Did it involve swelling of the face/tongue/throat, SOB, or low BP? Unknown Did it involve sudden or severe rash/hives, skin peeling, or any reaction on the inside of your mouth or nose? Unknown Did you need to seek medical attention at a hospital or doctor's office? Unknown When did it last happen?unknown If all above answers are "NO", may proceed with cephalosporin use.  . Dilaudid [Hydromorphone Hcl] Itching and Rash    Patient Measurements: Total Body Weight: 97.2 kg Height: 71 inches  Vital Signs: Temp: 98.3 F (36.8 C) (01/26 0417) BP: 123/78 (01/26 0417) Pulse Rate: 62 (01/26 0752)  Labs: Recent Labs    05/31/19 0520 06/01/19 0711 06/02/19 0539  HGB  --  12.0*  --   HCT  --  37.5*  --   PLT  --  382  --   LABPROT 28.4* 29.1* 30.0*  INR 2.7* 2.8* 2.9*  CREATININE 0.83 0.95  --     Estimated Creatinine Clearance: 108.9 mL/min (by C-G formula based on SCr of 0.95 mg/dL).   Assessment: 54 yr old male presented to the ED with stroke symptoms after recent discharge from [facility]. He is supposed to be on warfarin and Lovenox for history of a mechanical valve.  Patient is s/p thrombectomy and s/p bridging with Heparin to therapeutic INR with warfarin. Pharmacy consulted to dose.   INR continues to be therapeutic at 2.9 this AM, no bleeding reported, previous CBCs have been stable.     Goal of Therapy:  INR 2.5-3.5  Monitor platelets by anticoagulation protocol: Yes   Plan:  Warfarin 7.5 mg po x 1 dose  tonight Daily INR, s/s bleeding  Thank you Okey Regal, PharmD 06/02/2019 10:11 AM

## 2019-06-02 NOTE — Progress Notes (Signed)
Physical Therapy Session Note  Patient Details  Name: Jon [name] MRN: [id number] Date of Birth: [birth year]  Today's Date: 06/02/2019 PT Individual Time: 0973-5329 PT Individual Time Calculation (min): 59 min   Short Term Goals: Week 3:  PT Short Term Goal 1 (Week 3): Pt will perform stand<>pivot with LRAD CGA PT Short Term Goal 2 (Week 3): Pt will perform car transfer with LRAD min A PT Short Term Goal 3 (Week 3): Pt will ambulate 115ft with LRAD min A  Skilled Therapeutic Interventions/Progress Updates:    Pt received sitting in w/c and agreeable to therapy session. Pt received and maintained on RA throughout session with SpO2 staying >93%. Transported to/from gym in w/c for energy conservation. Sit<>stands using RW with min assist/CGA for balance/steadying throughout session with pt demonstrating lack of safety awareness to ensure R foot and R hand in proper position prior to initiating standing - requires cuing for correction. Gait training ~144ft using RW with min assist for balance and +2 w/c follow for safety - pt demonstrates progressively worsening gait mechanics and poorer AD management having it too far in front of him, worsening forward trunk flexion, worsening B LE foot clearance and step length resulting in shuffling gait pattern as well as poor safety awareness to correct mistakes - therapist providing mod cuing and assist for improved gait and AD management.  Standing balance and R UE NMR task using dynavision with CGA for steadying balance and L UE support on RW - pt prefers to stand with L LE slightly forward compared to R LE while maintaining >50% of his weight through R LE (reports this is his "normal" stance) while participating in 2 rounds of pushing buttons (10% of buttons were green w/ pt instructed not to touch those and he did that 100% of the time): 1st trial: with average reaction time of 3.69seconds and pt having most difficulty pushing the buttons in L  lower quadrant (touch 0/3) and R lower quadrant (2/4) 2nd trial: 3 minutes with average reaction time of 2.88 seconds and pt continuing to have most difficulty with lower quadrants L: 3/7 and R 6/13 - due to pt increasing difficulty with task therapist provided hand-over-hand assistance to touch lights the last minute Ambulatory ~79ft simulated car transfer (SUV height) using RW with min assist for balance while ambulating and min assist for balance while getting in/out of the vehicle - pt demonstrating proper sequencing of transfer with min cuing. Ambulated ~58ft up/down ramp using RW with min assist for balance and cuing for staying close to AD and slowing it down when descending ramp.  Dynamic standing balance and R UE NMR task of card matching with L UE support on RW and CGA for steadying - pt required increased time to grasp card in his R hand with cuing for finger extension prior to initiating grasp and ~45% of the time assist for repositioning the card in his hand to place on the board. Transported back to room. Squat pivot w/c>EOB using L UE support on bedrail and min assist for balance. Pt doffed shoes sitting EOB without assistance. Sit>supine with supervision. Pt left supine in bed with needs in reach and bed alarm on.    Therapy Documentation Precautions:  Precautions Precautions: Fall Precaution Comments: R hemi, dysarthria, dysphagia, on .5L O2 via [place], NG tube, and NPO Restrictions Weight Bearing Restrictions: No  Pain: No complaints of pain during session.    Therapy/Group: Individual Therapy  Ginny Forth, PT, DPT 06/02/2019,  3:32 PM

## 2019-06-02 NOTE — Progress Notes (Signed)
Physical Therapy Session Note  Patient Details  Name: Jon [name] MRN: [id number] Date of Birth: 03/22/1966  Today's Date: 06/02/2019 PT Individual Time: 0845-0944 PT Individual Time Calculation (min): 59 min   Short Term Goals: Week 2:  PT Short Term Goal 1 (Week 2): Pt will perform bed mobility with CGA PT Short Term Goal 1 - Progress (Week 2): Met PT Short Term Goal 2 (Week 2): Pt will perform stand<>pivot transfer with LRAD min A PT Short Term Goal 2 - Progress (Week 2): Met PT Short Term Goal 3 (Week 2): Pt will perform car transfer with LRAD mod A PT Short Term Goal 3 - Progress (Week 2): Met Week 3:  PT Short Term Goal 1 (Week 3): Pt will perform stand<>pivot with LRAD CGA PT Short Term Goal 2 (Week 3): Pt will perform car transfer with LRAD min A PT Short Term Goal 3 (Week 3): Pt will ambulate 150ft with LRAD min A  Skilled Therapeutic Interventions/Progress Updates:   Received pt sitting in WC with RN present administering medication, pt agreeable to therapy, and denied any pain throughout session. Pt on 1L O2 via [place] initially decreasing to RA during therapy. Session focused on functional mobility/transfers, ambulation, LE strength, balance/coordination, and improved activity tolerance. Pt initially frustrated unable to find underwear, RN and therapist assisted and attempted to calm patient down. Pt donned shoes max A for time management purposes. Pt transported to gym in WC for time management purposes. Pt requesting to go without O2. O2 sat 93% at rest on RA. Pt ambulated 150ft with RW min A +2 for WC follow. O2 sat 95% and HR 68bpm on RA. Pt continues to require verbal cues for RW safety, posture, and increased R LE step length. Pt transferred stand<>pivot with RW min A to mat. Pt performed standing connect 4 x 2 trials without AD CGA using R UE. Pt with increased fatigue during activity spontaneously sitting down to rest. Pt performed sit<>stand without UE support x10 CGA. O2  sat 93% on RA. Pt required multiple rest breaks throughout session due to increased fatigue. Pt performed standing alternating marching with bilateral UE support on RW x10 CGA/min A. Pt required verbal cues to increase BOS for safety. Pt with increased agitation stating "don't touch me". Pt with poor safety awareness. Therapist educated pt on current balance status and need to hold onto patient for safety due to LOB. O2 sat 91% HR 67bpm after activity. Concluded session with pt sitting in WC, needs within reach, and seatbelt alarm on.   Therapy Documentation Precautions:  Precautions Precautions: Fall Precaution Comments: R hemi, dysarthria, dysphagia, on .5L O2 via [place], NG tube, and NPO Restrictions Weight Bearing Restrictions: No   Therapy/Group: Individual Therapy Anna M Johnson  Anna Johnson PT, DPT   06/02/2019, 7:27 AM  

## 2019-06-02 NOTE — Progress Notes (Signed)
[facility] PHYSICAL MEDICINE & REHABILITATION PROGRESS NOTE  Subjective/Complaints: Patient seen laying in bed this morning.  Indicates he slept well overnight.  He is still sleepy this morning.  ROS: Limited due to lethargy  Objective: Vital Signs: Blood pressure 123/78, pulse 62, temperature 98.3 F (36.8 C), resp. rate 16, height 5\' 11"  (1.803 m), weight 101 kg, SpO2 98 %. No results found. Recent Labs    06/01/19 0711  WBC 6.6  HGB 12.0*  HCT 37.5*  PLT 382   Recent Labs    05/31/19 0520 06/01/19 0711  NA 141 139  K 3.9 3.8  CL 104 100  CO2 26 28  GLUCOSE 127* 129*  BUN 16 15  CREATININE 0.83 0.95  CALCIUM 8.8* 9.1    Physical Exam: BP 123/78   Pulse 62   Temp 98.3 F (36.8 C)   Resp 16   Ht 5\' 11"  (1.803 m)   Wt 101 kg   SpO2 98%   BMI 31.06 kg/m  Constitutional: No distress . Vital signs reviewed. HENT: Normocephalic.  Atraumatic. Eyes: EOMI. No discharge. Cardiovascular: No JVD. Respiratory: Normal effort.  No stridor. GI: Non-distended. Skin: Warm and dry.  Intact. Psych: Normal mood.  Normal behavior. Musc: No edema in extremities.  No tenderness in extremities. Neuro: Somnolent Severe dysarthria, stable Able to follow simple motor commands.  Motor:  RUE: 4-4+/5 proximal distal, stable RLE: Hip flexion, knee extension 4 +/5, dorsiflexion 4+/5, stable  Assessment/Plan: 1. 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  Physiatrist is providing close team supervision and 24 hour management of active medical problems listed below.  Physiatrist and rehab team continue to assess barriers to discharge/monitor patient progress toward functional and medical goals  Care Tool:  Bathing    Body parts bathed by patient: Right arm, Left lower leg, Left arm, Chest, Face, Abdomen, Front perineal area, Right upper leg, Left  upper leg, Right lower leg   Body parts bathed by helper: Buttocks     Bathing assist Assist Level: Minimal Assistance - Patient > 75%     Upper Body Dressing/Undressing Upper body dressing   What is the patient wearing?: Pull over shirt    Upper body assist Assist Level: Minimal Assistance - Patient > 75%    Lower Body Dressing/Undressing Lower body dressing      What is the patient wearing?: Pants, Underwear/pull up     Lower body assist Assist for lower body dressing: Minimal Assistance - Patient > 75%     Toileting Toileting    Toileting assist Assist for toileting: Minimal Assistance - Patient > 75%     Transfers Chair/bed transfer  Transfers assist     Chair/bed transfer assist level: Minimal Assistance - Patient > 75%     Locomotion Ambulation   Ambulation assist   Ambulation activity did not occur: Safety/medical concerns(fatigue, LE weakness, restricted to threapy in room due to IV fluids)  Assist level: 2 helpers Assistive device: Walker-rolling Max distance: 150'   Walk 10 feet activity   Assist  Walk 10 feet activity did not occur: Safety/medical concerns(fatigue, LE weakness, restricted to threapy in room due to IV fluids)  Assist level: Minimal Assistance - Patient > 75% Assistive device: Walker-rolling   Walk 50 feet activity   Assist Walk 50 feet with 2 turns activity did not occur: Safety/medical concerns(fatigue, LE weakness, restricted to threapy in room  due to IV fluids)  Assist level: Minimal Assistance - Patient > 75% Assistive device: Walker-rolling    Walk 150 feet activity   Assist Walk 150 feet activity did not occur: Safety/medical concerns(fatigue, LE weakness, restricted to threapy in room due to IV fluids)  Assist level: 2 helpers(for safety) Assistive device: Walker-rolling    Walk 10 feet on uneven surface  activity   Assist Walk 10 feet on uneven surfaces activity did not occur: Safety/medical  concerns(fatigue, LE weakness, restricted to threapy in room due to IV fluids)         Wheelchair     Assist Will patient use wheelchair at discharge?: Yes Type of Wheelchair: Manual Wheelchair activity did not occur: Safety/medical concerns(fatigue, R hemi, restricted to threapy in room due to IV fluids)  Wheelchair assist level: Supervision/Verbal cueing Max wheelchair distance: 140ft    Wheelchair 50 feet with 2 turns activity    Assist    Wheelchair 50 feet with 2 turns activity did not occur: Safety/medical concerns(fatigue, R hemi, restricted to threapy in room due to IV fluids)   Assist Level: Supervision/Verbal cueing   Wheelchair 150 feet activity     Assist Wheelchair 150 feet activity did not occur: Safety/medical concerns(fatigue, R hemi, restricted to threapy in room due to IV fluids)   Assist Level: Supervision/Verbal cueing      Medical Problem List and Plan: 1.  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  Continue CIR 2.  Antithrombotics: -DVT/anticoagulation:  Pharmaceutical: Coumadin goal 2.5-3.5.   INR therapeutic 1/26  -antiplatelet therapy: on ASA 3. Pain Management: tylenol prn 4. Mood: LCSW to follow for evaluation and support when appropriate.              -antipsychotic agents: N/A 5. Neuropsych: This patient is not fully capable of making decisions on his own behalf. 6. Skin/Wound Care: Routine pressure relief measures.  7. Fluids/Electrolytes/Nutrition: Monitor electrolytes 8. Aspiration PNA: Completed 7 day course of Unasyn on 1/12. Needs to be upright with pulmonary toilet.    Wean supplemental oxygen as tolerated 9. CAF/CHB s/p PPM:   Monitor HR Off amiodarone and Entresto due to issues with hypotension.   Controlled on 1/26 11 COPD/OSA: Duonebs and incruse resumed. Encourage IS and keep HOB>30 degrees.    Communicated medication adjustments with respiratory therapy 12. Depression/anxiety disorder: Emotional support 13. H/o migraines: Has been managed on Topamax and gabapentin.  14. Hypokalemia:   Kdur DC'd on 1/11  Potassium 3.8 on 1/25  Continue to monitor labs 15.  Hyperglycemia-secondary to tube feeds  Labile on 1/26  Continue SSI as needed 16.  Leukocytosis: Resolved  WBCs 9.0 on 1/13  Afebrile  Continue to monitor 17.  Acute blood loss anemia  Hemoglobin 12.0 on 11/25  Continue to monitor 18.  Hypoalbuminemia  Adjust with tube feeds 19.  Acute on chronic combined congestive heart failure  EF 25-30%  Daily weights  No sign of fluid overload at this time, continue to monitor Filed Weights   05/31/19 0426 06/01/19 0500 06/02/19 0500  Weight: 100.7 kg 102.7 kg 101 kg   Lasix 20 daily started on 1/19, increased on 1/20, increased to 40 twice daily  Free water flushes decreased on 1/22---hold on 1/23  -pt is on 40mg  am and 20mg  pm lasix at home  ?  Reliability on 1/26 20.  Loose stools  Discussed changing tube feeds with dietary  KUB reviewed,  unremarkable  Improved after completion of abx 21.  Post stroke dysphagia  Continue tube feeds  Advance D1 pudding  Continue to advance as tolerated      LOS: 20 days A FACE TO FACE EVALUATION WAS PERFORMED  Dushawn Pusey Karis Juba 06/02/2019, 8:20 AM

## 2019-06-02 NOTE — Progress Notes (Signed)
Nutrition Follow-up  RD working remotely.  DOCUMENTATION CODES:   Obesity unspecified  INTERVENTION:   Continue nocturnal tube feeding regimen: - Vital 1.5 @ 75 ml/hr to run for 10 hours from 2000 to 0600 - Pro-stat 30 ml BID - Free water per MD  Nocturnal tube feeding regimenprovides1325kcal, 81grams of protein, and of H2O (58% kcal needs, 70% protein needs).  NUTRITION DIAGNOSIS:   Inadequate oral intake related to dysphagia as evidenced by NPO status.  Ongoing, being addressed via TFand diet advancement  GOAL:   Patient will meet greater than or equal to 90% of their needs  Progressing  MONITOR:   PO intake, Diet advancement, Labs, Weight trends, TF tolerance  REASON FOR ASSESSMENT:   Consult Enteral/tube feeding initiation and management  ASSESSMENT:   54 year old male with PMH of CAF, CHB-s/p PPM, migraines, aortic dissection s/p AVR complicated by CVA with residual dysarthria, gait disorder and visual deficits. Pt presented on 05/08/19 with sudden right-sided weakness and left gaze deviation. CT head showed abnormal hypodensity left colossal body and possible pericallosal gyrus. 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 CTA chest showed diffuse lung opacity concerning for PNA and dissection or aneurysm . He underwent cerebral angiogram with attempted thrombectomy of left A2 occlusion without significant improvement in flow and nonflow limiting dissection of distal cervical segment identified. Neurology felt the stroke was embolic in setting of subtherapeutic INR. Pt was placed on dysphagia 1 diet with honey-thick liquids due to dysphagia on 05/10/19. Chest x-ray performed on 05/12/19 suggestive of aspiration pneumonia. Pt was made NPO and Cortrak placed for  nutrition support. Pt admitted to CIR on 05/13/19.  01/15 - s/p MBSS with recommendations for Dysphagia 1 diet with honey-thick liquids  Cortrak remains in place. Free water flushes currently on hold due to volume status.  Weight stable since last RD assessment.  Will continue with nocturnal tube feeds until pt is able to demonstrate ability to consistently meet kcal and protein needs via PO intake alone. Several meal completions were 10-25%.  Meal Completion: 10-100% x last 8 meals (averaging 50%)  Medications reviewed and include: Lasix 40 mg BID, SSI q 4 hours, protonix, warfarin  Labs reviewed. CBG's: 74-144 x 24 hours  UOP: 700 ml x 24 hours I/O's: +11.4 L since admit  Diet Order:   Diet Order            DIET - DYS 1 Room service appropriate? Yes; Fluid consistency: Pudding Thick  Diet effective now              EDUCATION NEEDS:   No education needs have been identified at this time  Skin:  Skin Assessment: Skin Integrity Issues: Incisions: right groin  Last BM:  06/02/19 medium type 4  Height:   Ht Readings from Last 1 Encounters:  05/16/19 5\' 11"  (1.803 m)    Weight:   Wt Readings from Last 1 Encounters:  06/02/19 101 kg    Ideal Body Weight:  78.2 kg  BMI:  Body mass index is 31.06 kg/m.  Estimated Nutritional Needs:   Kcal:  2300-2500  Protein:  115-130 grams  Fluid:  >/= 2.0 L    06/04/19, MS, RD, LDN Inpatient Clinical Dietitian Pager: [phone] Weekend/After Hours: [phone]

## 2019-06-02 NOTE — Plan of Care (Signed)
  Problem: Sit to Stand Goal: LTG:  Patient will perform sit to stand with assistance level (PT) Description: LTG:  Patient will perform sit to stand with assistance level (PT) Flowsheets (Taken 06/02/2019 1600) LTG: PT will perform sit to stand in preparation for functional mobility with assistance level: (downgraded due to impulsiveness and decreased postural control) Contact Guard/Touching assist Note: downgraded due to impulsiveness and decreased postural control   Problem: RH Bed to Chair Transfers Goal: LTG Patient will perform bed/chair transfers w/assist (PT) Description: LTG: Patient will perform bed to chair transfers with assistance (PT). Flowsheets (Taken 06/02/2019 1600) LTG: Pt will perform Bed to Chair Transfers with assistance level: (downgraded due to impulsiveness and decreased postural control) Contact Guard/Touching assist Note: downgraded due to impulsiveness and decreased postural control   Problem: RH Car Transfers Goal: LTG Patient will perform car transfers with assist (PT) Description: LTG: Patient will perform car transfers with assistance (PT). Flowsheets (Taken 06/02/2019 1600) LTG: Pt will perform car transfers with assist:: (downgraded due to impulsiveness and decreased postural control) Contact Guard/Touching assist Note: downgraded due to impulsiveness and decreased postural control

## 2019-06-03 ENCOUNTER — Inpatient Hospital Stay (HOSPITAL_COMMUNITY): Payer: Medicaid Other | Admitting: Occupational Therapy

## 2019-06-03 ENCOUNTER — Inpatient Hospital Stay (HOSPITAL_COMMUNITY): Payer: Medicaid Other

## 2019-06-03 ENCOUNTER — Inpatient Hospital Stay (HOSPITAL_COMMUNITY): Payer: Medicaid Other | Admitting: Speech Pathology

## 2019-06-03 LAB — PROTIME-INR
INR: 3 — ABNORMAL HIGH (ref 0.8–1.2)
Prothrombin Time: 30.7 seconds — ABNORMAL HIGH (ref 11.4–15.2)

## 2019-06-03 LAB — GLUCOSE, CAPILLARY
Glucose-Capillary: 108 mg/dL — ABNORMAL HIGH (ref 70–99)
Glucose-Capillary: 117 mg/dL — ABNORMAL HIGH (ref 70–99)
Glucose-Capillary: 155 mg/dL — ABNORMAL HIGH (ref 70–99)
Glucose-Capillary: 80 mg/dL (ref 70–99)
Glucose-Capillary: 89 mg/dL (ref 70–99)
Glucose-Capillary: 96 mg/dL (ref 70–99)

## 2019-06-03 MED ORDER — WARFARIN SODIUM 7.5 MG PO TABS
7.5000 mg | ORAL_TABLET | Freq: Once | ORAL | Status: AC
  Administered 2019-06-03: 7.5 mg via ORAL
  Filled 2019-06-03: qty 1

## 2019-06-03 NOTE — Patient Care Conference (Signed)
Inpatient RehabilitationTeam Conference and Plan of Care Update Date: 06/03/2019   Time: 11:30 AM    Patient Name: Jon [name]      Medical Record Number: [id number]  Date of Birth: 1965/07/08 Sex: Male         Room/Bed: 4W05C/4W05C-01 Payor Info: Payor: MEDICAID [place] / Plan: MEDICAID [place] ACCESS / Product Type: *No Product type* /    Admit Date/Time:  05/13/2019  5:12 PM  Primary Diagnosis:  Acute ischemic stroke [facility])  Patient Active Problem List   Diagnosis Date Noted  . Reactive depression   . Supplemental oxygen dependent   . Labile blood glucose   . Chronic combined systolic and diastolic congestive heart failure ([place])   . Loose stools   . Acute on chronic combined systolic and diastolic congestive heart failure ([place])   . Hypoalbuminemia due to protein-calorie malnutrition ([place])   . Hyperglycemia   . Subtherapeutic international normalized ratio (INR)   . Embolic stroke ([place]) 47/42/5956  . Dysphagia, post-stroke   . Migraine without status migrainosus, not intractable   . Anxiety and depression   . Chronic obstructive pulmonary disease ([place])   . OSA (obstructive sleep apnea)   . Chronic atrial fibrillation ([place])   . Hypokalemia   . Essential hypertension 05/12/2019  . Hyperlipidemia 05/12/2019  . Diabetes mellitus type II, controlled ([place]) 05/12/2019  . Dysphagia 05/12/2019  . Aspiration pneumonia ([place]) 05/12/2019  . Leukocytosis 05/12/2019  . Cigarette smoker 05/12/2019  . Obesity 05/12/2019  . Family hx-stroke 05/12/2019  . Acute blood loss anemia 05/12/2019  . Stroke (cerebrum) ([place]) 05/08/2019  . Acute ischemic stroke ([place]) L MCA s/p attempted IR, embolic d/t PAF, low EF and subtherapeutic AC 05/08/2019  . Status post aortic valve replacement 02/23/2019  . Pacemaker [place] Jude device implanted in summer 2020 02/23/2019  . Erectile dysfunction 12/10/2017  . Nontraumatic multiple localized intracerebral hemorrhages ([place]) 10/16/2017  . Dysarthria  09/10/2017  . VBI (vertebrobasilar insufficiency) 09/10/2017  . Cholelithiasis 12/20/2016  . Current use of long term anticoagulation 12/20/2016  . Late effects of CVA (cerebrovascular accident) 08/08/2015  . Paroxysmal atrial fibrillation ([place]) 08/08/2015  . Presence of prosthetic heart valve 08/08/2015  . 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 Idiopathic peripheral neuropathy 07/14/2013  . Cerebral thrombosis with cerebral infarction ([place]) 07/14/2013  . Generalized constriction of visual field 10/15/2012  . Seroma, postoperative 06/13/2012  . CVA (cerebral infarction) 05/11/2012  . Cerebral infarction ([place]) 05/11/2012  . Stroke, embolic ([place]) 38/75/6433  . History of CVA (cerebrovascular accident) 05/07/2012  . HCAP (healthcare-associated pneumonia) 05/06/2012  . Syncope 05/06/2012  . Anxiety 05/06/2012  . Aortic stenosis 05/06/2012  . Aortic aneurysm and dissection ([place]) 05/06/2012  . Thoracic aortic aneurysm ([place]) 05/06/2012  . Aortic valve disorder 05/06/2012  . Dissecting aortic aneurysm ([place]) 05/06/2012  . Aneurysm of thoracic aorta ([place]) 05/06/2012    Expected Discharge Date: Expected Discharge Date: 06/09/19  Team Members Present: Physician leading conference: Dr. Delice Lesch Social Worker Present: Lennart Pall, LCSW Nurse Present: Dorien Chihuahua, RN;Karen Lovena Neighbours, RN Case Manager: Karene Fry, RN PT Present: Becky Sax, PT OT Present: Simonne Come, OT SLP Present: Jettie Booze, CF-SLP PPS Coordinator present : Ileana Ladd, PT     Current Status/Progress Goal Weekly Team Focus  Bowel/Bladder   Patient is continent of bowel and bladder  Patien to remain continent of bowel and bladder  Toileting every 2 hours/PRN   Swallow/Nutrition/  Hydration   Dys 1/pudding thick liquids, persistent oral and pharyngeal swallow deficits, severe aspiration risk  Min A least restrictive diet   Tolerance Dys1/pudding, upgraded Dys2 trials, thin trials with chin tuck, repeat MBSS to assess potential for advancement prior to d/c, finish family education   ADL's   Min assist transfers ambulating with RW, Min A to CGA with bathing and LB dressing (except continues to require mod assist with socks/shoes), Supervision UB dressing.  Downgraded any dynamic standing activity (bathing, LB dressing, standing) to CGA due to impulsivity and decreased safety awareness, Supervision seated tasks  ADL retraining, RUE NMR, standing balance, safety awareness, awareness of deficits, reinforcement of communication strategies, family education, d/c planning   Mobility   bed mobility supervision, transfers min A, ambulation 164ft with RW min A, 8 steps 2 rails min A  supervision transfers, CGA gait and stairs  functional mobility/transfers, ambulation, stair navigation, car transfers, LE strength, balance/coordination, endurance   Communication   Mod A ~60-70% intelligible  Min A  carryover speech strategies phrase level (pacing/pausing between words, overarticulation)   Safety/Cognition/ Behavioral Observations  Min A problem solving, Min-Mod susatined attention  Supervision A  basic problem solving and susatined attention, self-monitoring   Pain   Patient denies pain during this time  Remain pain free  Assess pain every shift/PRN   Skin   Pt has ecchymosis to BUE and abdomen, abrasion to right arm  Prevent further skin breakdown  Assess skin every shift/PRN    Rehab Goals Patient on target to meet rehab goals: Yes *See Care Plan and progress notes for long and short-term goals.     Barriers to Discharge  Current Status/Progress Possible Resolutions Date Resolved   Nursing                  PT     dysarthria and impulsive              OT                  SLP                SW                Discharge Planning/Teaching Needs:  Pt to return home with mother as primary caregiver and other  family (brother/ sis-in-law) to provide 24/7 assistance.  Planned to begin Thursday 1/28   Team Discussion: Expressive aphasia, r hemi, dysarthria, on RA X 2 days.  RN cont B/B, tylenol for headaches, coretrak present.  OT min transfers with walker, impulsive, downgrade to min guard, min/CGA bathing and LB dressing, sensation impaired, needs cues with self care.  PT on RA, down to 85% with 4 stairs, min transfers/amb/stairs, impulsive, max cues safety walking, downgrade goals CGA.  SLP D1puree/pudding thick, likely home on puree, repeat MBS tom/Fri, needs cues for strategies for swallow, speech mod A, fam ed tom with mon and sister in law.   Revisions to Treatment Plan: N/A     Medical Summary Current Status: Expressive aphasia, right hemiparesis, dysarthria, poor safety awareness, limitations in self-care, dysphagia secondary to bilateral CVA, left greater than right Weekly Focus/Goal: Improve mobility, dysphagia, CHF  Barriers to Discharge: Medical stability;Weight;Nutrition means   Possible Resolutions to Barriers: Therapies, diuretics, follow labs, adance diet as tolerated   Continued Need for Acute Rehabilitation Level of Care: The patient requires daily medical management by a physician with specialized training in physical medicine and rehabilitation for the following reasons:  Direction of a multidisciplinary physical rehabilitation program to maximize functional independence : Yes Medical management of patient stability for increased activity during participation in an intensive rehabilitation regime.: Yes Analysis of laboratory values and/or radiology reports with any subsequent need for medication adjustment and/or medical intervention. : Yes   I attest that I was present, lead the team conference, and concur with the assessment and plan of the team.   Trish Mage 06/03/2019, 9:42 PM   Team conference was held via web/ teleconference due to COVID - 19

## 2019-06-03 NOTE — Progress Notes (Signed)
Speech Language Pathology Daily Session Note  Patient Details  Name: Jon [name] MRN: [id number] Date of Birth: 07/13/1965  Today's Date: 06/03/2019 SLP Individual Time: 1331-1430 SLP Individual Time Calculation (min): 59 min  Short Term Goals: Week 3: SLP Short Term Goal 1 (Week 3): Pt will consume current diet with minimal overt s/sx aspiration and Supervision A for use of swallow strategies. SLP Short Term Goal 2 (Week 3): Pt wil consume upgraded trials Dys 2 X2 with efficient mastication and oral clearance X3 prior to solid upgrade. SLP Short Term Goal 3 (Week 3): During therapeutic trials of thin via teaspoon or small ice chips, pt will implement chin tuck strategy with Min A verbal cues. SLP Short Term Goal 4 (Week 3): Pt will utilize speech intelligibility strategies (specifically pausing between words and overarticulation) to increase intelligibility to 70% at the word level with Min A verbal cues. SLP Short Term Goal 5 (Week 3): Pt will demonstrate basic problem solving during functional tasks with Min A verbal/visual cues.  Skilled Therapeutic Interventions: Pt was seen for skilled ST targeting dysphagia and cognitive goals including sustained attention, awareness, and problem solving. Following oral care, pt accepted ice and teaspoon H2O trials. He exhibited immediate cough in 100% (2/2) ice chip presentations, and 50% thin teaspoon presentations. Total of 2 verbal cues were required for pt to implement chin tuck throughout intake. Although he is more aware of his distractibility, pt is with limited ability to self-monitor/self-correct to redirect himself to tasks. Extensive education regarding oral and pharyngeal phase swallow impairments reviewed with pt and subsequent current diet recommendations (Dys 1/Pudding thick liquids), however he continues to express discontent surrounding diet. Pt stated "I would rather die than eat that". Given pt's statements in today's session and  impulsivity, SLP questions whether pt will remain in compliance with diet recommendations and swallow strategies after leaving the hospital. Therefore, SLP emphasized pt's severe aspiration risk and potential to develop aspiration pneumonia again. Mod A verbal cues were required for problem solving and sequencing when planning and executing transfers from bed to chair as well as completing self care tasks at the sink. Increased Max A verbal and visual cues required for safety awareness (ex: pt attempting to get out of bed with rails still up, attempting to stand before chair locked or walker placed in front of him, etc.). Pt also highly distracted, both internally and externally today, therefore, Mod-Max A verbal cues were required for redirection to tasks and conversational topics throughout session. Pt left laying in bed with alarm set and needs within reach. Continue per current plan of care.        Pain Pain Assessment Pain Scale: 0-10 Pain Score: 0-No pain  Therapy/Group: Individual Therapy  Jon [name] 06/03/2019, 2:36 PM

## 2019-06-03 NOTE — Progress Notes (Signed)
Occupational Therapy Session Note  Patient Details  Name: Jon [name] MRN: [id number] Date of Birth: [birth year]  Today's Date: 06/03/2019 OT Individual Time: 8546-2703 OT Individual Time Calculation (min): 60 min    Short Term Goals: Week 3:  OT Short Term Goal 1 (Week 3): Pt will complete toilet transfers with min assist ambulating with RW OT Short Term Goal 2 (Week 3): Pt will complete LB dressing to include donning socks/shoes with min assist OT Short Term Goal 3 (Week 3): Pt will complete 2 grooming tasks in standing with min assist for increased standing tolerance/balance  Skilled Therapeutic Interventions/Progress Updates:    Treatment session with focus on functional transfers, dynamic standing balance, and functional use of RUE during self-care tasks.  Pt received semi-reclined in bed with MD present.  Pt requesting to shower this session.  Completed bed mobility with supervision.  Ambulated to toilet with RW with min assist.  Pt completed transfer with Min assist and mod cues due to impulsivity and pushing RW out to side as approaching toilet.  Ambulated to shower with RW with min assist.  Engaged in bathing with hand over hand assist to ensure proper grasp with Rt hand on wash cloth.  Pt dropping cloth 4 times during bathing with no awareness, often continuing with washing despite not having cloth. Provided pt with wash mit after shower, educating on use with plan to utilize during next bathing session.  Pt ambulated back to room with RW with CGA.  Engaged in dressing with mod cues for sequencing and problem solving as pt struggling with donning underwear as he was still slightly damp post shower.  Min assist and tactile cues for upright standing posture while attempting to pull underwear and pants over hips.  Pt able to pull shorts over hips, but required assistance to adjust underwear over Rt hip.  Mod cues for safety when completing oral care to ensure that pt did not swallow any  water due to aspiration risk.  Pt remained upright in w/c with RN present to administer meds.    Therapy Documentation Precautions:  Precautions Precautions: Fall Precaution Comments: R hemi, dysarthria, dysphagia, on .5L O2 via [place], NG tube, and NPO Restrictions Weight Bearing Restrictions: No General:   Vital Signs: Therapy Vitals Temp: 98.7 F (37.1 C) Pulse Rate: (!) 59 Resp: 18 BP: 111/76 Patient Position (if appropriate): Lying Oxygen Therapy SpO2: 99 % O2 Device: Room Air Pain:  Pt with no c/o pain   Therapy/Group: Individual Therapy  Rosalio Loud 06/03/2019, 1:25 PM

## 2019-06-03 NOTE — Progress Notes (Signed)
ANTICOAGULATION CONSULT NOTE  Pharmacy Consult for Warfarin Indication: stroke and mechanical valve, atrial fibrillation  Allergies  Allergen Reactions  . Hydrocodone-Acetaminophen Other (See Comments)    PT MOTHER DOES NOT REMEMBER REACTION  Only use in Emergency   . Hydromorphone Itching and Rash       . Other Other (See Comments)    ALL NARCOTICS-ITCHING, RASH   . Oxycodone Other (See Comments)    Unknown reaction  . Penicillins Rash    Did it involve swelling of the face/tongue/throat, SOB, or low BP? Unknown Did it involve sudden or severe rash/hives, skin peeling, or any reaction on the inside of your mouth or nose? Unknown Did you need to seek medical attention at a hospital or doctor's office? Unknown When did it last happen?unknown If all above answers are "NO", may proceed with cephalosporin use.  . Dilaudid [Hydromorphone Hcl] Itching and Rash    Patient Measurements: Total Body Weight: 97.2 kg Height: 71 inches  Vital Signs: Temp: 97.8 F (36.6 C) (01/27 0348) Temp Source: Oral (01/27 0348) BP: 103/73 (01/27 0348) Pulse Rate: 63 (01/27 0731)  Labs: Recent Labs    06/01/19 0711 06/02/19 0539 06/03/19 0500  HGB 12.0*  --   --   HCT 37.5*  --   --   PLT 382  --   --   LABPROT 29.1* 30.0* 30.7*  INR 2.8* 2.9* 3.0*  CREATININE 0.95  --   --     Estimated Creatinine Clearance: 109.1 mL/min (by C-G formula based on SCr of 0.95 mg/dL).   Assessment: 54 yr old male presented to the ED with stroke symptoms after recent discharge from [facility]. He is supposed to be on warfarin and Lovenox for history of a mechanical valve.  Patient is s/p thrombectomy and s/p bridging with Heparin to therapeutic INR with warfarin. Pharmacy consulted to dose.   INR continues to be therapeutic at 3 this AM, no bleeding reported, previous CBCs have been stable.     Goal of Therapy:  INR 2.5-3.5  Monitor platelets by anticoagulation protocol: Yes   Plan:   Warfarin 7.5 mg po x 1 dose tonight Daily INR, s/s bleeding  Siren Porrata A. Jeanella Craze, PharmD, BCPS, FNKF Clinical Pharmacist [place] Please utilize Amion for appropriate phone number to reach the unit pharmacist [facility])   06/03/2019 11:54 AM

## 2019-06-03 NOTE — Progress Notes (Signed)
Physical Therapy Weekly Progress Note  Patient Details  Name: Jon [name] MRN: [id number] Date of Birth: 04-09-1966  Beginning of progress report period: May 14, 2019 End of progress report period: June 03, 2019  Today's Date: 06/03/2019 PT Individual Time: 8413-2440 PT Individual Time Calculation (min): 56 min   Patient has met 2 of 3 short term goals. Pt demonstrates improvements in functional mobility/transfers, LE strength, balance, ambulation, oxygen saturation, and endurance. Pt currently requires supervision for bed mobility, min A for transfers with/without RW, ambulation 181f with RW min A +2 for WC follow/safety, 8 steps with 2 rails min A, and WC mobility 1556fwith bilateral LEs and supervision. Pt continues to demonstrate difficulties with safety awareness, impulsivity, R NMR and attention, breathing techniques, and endurance with activity.   Patient continues to demonstrate the following deficits muscle weakness, impaired timing and sequencing, unbalanced muscle activation, decreased coordination and decreased motor planning and decreased standing balance, decreased postural control, hemiplegia and decreased balance strategies and therefore will continue to benefit from skilled PT intervention to increase functional independence with mobility.  Patient progressing toward long term goals..  Continue plan of care.  PT Short Term Goals Week 3:  PT Short Term Goal 1 (Week 3): Pt will perform stand<>pivot with LRAD CGA PT Short Term Goal 1 - Progress (Week 3): Progressing toward goal PT Short Term Goal 2 (Week 3): Pt will perform car transfer with LRAD min A PT Short Term Goal 2 - Progress (Week 3): Met PT Short Term Goal 3 (Week 3): Pt will ambulate 15059fith LRAD min A PT Short Term Goal 3 - Progress (Week 3): Met Week 4:  PT Short Term Goal 1 (Week 4): STG=LTG due to LOS  Skilled Therapeutic Interventions/Progress Updates:  Ambulation/gait training;Discharge  planning;Functional mobility training;Therapeutic Activities;Balance/vestibular training;Disease management/prevention;Neuromuscular re-education;Therapeutic Exercise;Wheelchair propulsion/positioning;Cognitive remediation/compensation;DME/adaptive equipment instruction;Pain management;Splinting/orthotics;UE/LE Strength taining/ROM;Community reintegration;Functional electrical stimulation;Patient/family education;Stair training;UE/LE Coordination activities   Today's Interventions: Received pt sitting in WC, pt agreeable to therapy, and denied any pain during session. Pt on RA throughout session. Session focused on functional mobility/transfers, ambulation, simulated car transfers, stair navigation, LE strength, dynamic standing balance, and improved endurance with activity. Pt continues to remain impulsive with mobility and requires verbal cues for safety. Pt performed WC mobility 150f31fing LUE and bilateral LEs with supervision. Pt performed ambulatory 20ft98f transfer with RW min A. O2 sat 93% after activity on RA. Pt ambulated 5ft w63fout AD min A to stairs. Pt navigated 4 steps with 2 rails min A ascending and descending with a step to pattern. Pt required max verbal cues for foot placement on step. O2 sat 85% increasing to 95% with 2 minute seated rest break and cues for pursed lip breathing.  Pt ambulated 175ft w66fRW min A +2 for WC follow. Pt continues to require max verbal cues for increased R LE step length, posture, and RW safety. Pt with extremely narrow BOS when turning and poor safety awareness requiring max verbal cues to prevent anterior LOB. Pt also requires frequent verbal reminders to reposition R UE on handgrip of RW. O2 sat 97% after activity. Pt stated "you get on my nerves" when therapist educated pt on safety awareness and techniques for energy conservation. Pt performed bilateral UE and LE strengthening on Nustep for 6 minutes at workload 7 for a total of  269 steps. O2 sat 91%  increasing to 97% with seated rest break. Stand<>pivot without AD WC<>bed and sit<>supine with supervision. Concluded session with pt  supine in bed, needs within reach, and bed alarm on.   Therapy Documentation Precautions:  Precautions Precautions: Fall Precaution Comments: R hemi, dysarthria, dysphagia, on .5L O2 via [place], NG tube, and NPO Restrictions Weight Bearing Restrictions: No  Therapy/Group: Individual Therapy  Alfonse Alpers PT, DPT  06/03/2019, 7:40 AM

## 2019-06-03 NOTE — Progress Notes (Signed)
Jon [name] PHYSICAL MEDICINE & REHABILITATION PROGRESS NOTE  Subjective/Complaints: Patient seen laying in bed this AM.  He states he did not sleep well overnight due to staff "touching" him.  He acknowledge that he knows "they are doing their job". He has questions regarding discharge. He has questions regarding removal of NG. He is about to work with therapies.   ROS: Denies CP, SOB, N/V/D  Objective: Vital Signs: Blood pressure 103/73, pulse 63, temperature 97.8 F (36.6 C), temperature source Oral, resp. rate 16, height 5\' 11"  (1.803 m), weight 101.5 kg, SpO2 94 %. No results found. Recent Labs    06/01/19 0711  WBC 6.6  HGB 12.0*  HCT 37.5*  PLT 382   Recent Labs    06/01/19 0711  NA 139  K 3.8  CL 100  CO2 28  GLUCOSE 129*  BUN 15  CREATININE 0.95  CALCIUM 9.1    Physical Exam: BP 103/73 (BP Location: Left Arm)   Pulse 63   Temp 97.8 F (36.6 C) (Oral)   Resp 16   Ht 5\' 11"  (1.803 m)   Wt 101.5 kg   SpO2 94%   BMI 31.21 kg/m  Constitutional: No distress . Vital signs reviewed. HENT: Normocephalic.  Atraumatic. +NG. Eyes: EOMI. No discharge. Cardiovascular: No JVD. Respiratory: Normal effort.  No stridor. GI: Non-distended. Skin: Warm and dry.  Intact. Psych: Normal mood.  Normal behavior. Musc: No edema in extremities.  No tenderness in extremities. Neuro: Alert Severe dysarthria, unchanged Able to follow simple motor commands.  Motor:  RUE: 4-4+/5 proximal distal, unchanged RLE: Hip flexion, knee extension 4 +/5, dorsiflexion 4+/5, unchanged  Assessment/Plan: 1. 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  Physiatrist is providing close team supervision and 24 hour management of active medical problems listed below.  Physiatrist and rehab team continue to assess barriers to discharge/monitor patient progress toward  functional and medical goals  Care Tool:  Bathing    Body parts bathed by patient: Right arm, Left lower leg, Left arm, Chest, Face, Abdomen, Front perineal area, Right upper leg, Left upper leg, Right lower leg   Body parts bathed by helper: Buttocks     Bathing assist Assist Level: Minimal Assistance - Patient > 75%     Upper Body Dressing/Undressing Upper body dressing   What is the patient wearing?: Pull over shirt    Upper body assist Assist Level: Supervision/Verbal cueing    Lower Body Dressing/Undressing Lower body dressing      What is the patient wearing?: Pants, Underwear/pull up     Lower body assist Assist for lower body dressing: Minimal Assistance - Patient > 75%     Toileting Toileting    Toileting assist Assist for toileting: Minimal Assistance - Patient > 75%     Transfers Chair/bed transfer  Transfers assist     Chair/bed transfer assist level: Minimal Assistance - Patient > 75%     Locomotion Ambulation   Ambulation assist   Ambulation activity did not occur: Safety/medical concerns(fatigue, LE weakness, restricted to threapy in room due to IV fluids)  Assist level: Minimal Assistance - Patient > 75%(+2 w/c follow) Assistive device: Walker-rolling Max distance: 142ft   Walk 10 feet activity   Assist  Walk 10 feet activity did not occur: Safety/medical concerns(fatigue, LE weakness, restricted to threapy in room due to IV fluids)  Assist level: Minimal Assistance - Patient >  75% Assistive device: Walker-rolling   Walk 50 feet activity   Assist Walk 50 feet with 2 turns activity did not occur: Safety/medical concerns(fatigue, LE weakness, restricted to threapy in room due to IV fluids)  Assist level: Minimal Assistance - Patient > 75% Assistive device: Walker-rolling    Walk 150 feet activity   Assist Walk 150 feet activity did not occur: Safety/medical concerns(fatigue, LE weakness, restricted to threapy in room due to  IV fluids)  Assist level: Minimal Assistance - Patient > 75% Assistive device: Walker-rolling    Walk 10 feet on uneven surface  activity   Assist Walk 10 feet on uneven surfaces activity did not occur: Safety/medical concerns(fatigue, LE weakness, restricted to threapy in room due to IV fluids)         Wheelchair     Assist Will patient use wheelchair at discharge?: Yes Type of Wheelchair: Manual Wheelchair activity did not occur: Safety/medical concerns(fatigue, R hemi, restricted to threapy in room due to IV fluids)  Wheelchair assist level: Supervision/Verbal cueing Max wheelchair distance: 160ft    Wheelchair 50 feet with 2 turns activity    Assist    Wheelchair 50 feet with 2 turns activity did not occur: Safety/medical concerns(fatigue, R hemi, restricted to threapy in room due to IV fluids)   Assist Level: Supervision/Verbal cueing   Wheelchair 150 feet activity     Assist Wheelchair 150 feet activity did not occur: Safety/medical concerns(fatigue, R hemi, restricted to threapy in room due to IV fluids)   Assist Level: Supervision/Verbal cueing      Medical Problem List and Plan: 1.  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  Continue CIR  Team conference today to discuss current and goals and coordination of care, home and environmental barriers, and discharge planning with nursing, case manager, and therapies.  2.  Antithrombotics: -DVT/anticoagulation:  Pharmaceutical: Coumadin goal 2.5-3.5.   INR therapeutic 1/27  -antiplatelet therapy: on ASA 3. Pain Management: tylenol prn 4. Mood: LCSW to follow for evaluation and support when appropriate.              -antipsychotic agents: N/A 5. Neuropsych: This patient is not fully capable of making decisions on his own behalf. 6. Skin/Wound Care: Routine pressure relief measures.   7. Fluids/Electrolytes/Nutrition: Monitor electrolytes 8. Aspiration PNA: Completed 7 day course of Unasyn on 1/12. Needs to be upright with pulmonary toilet.    Weaned supplemental oxygen as tolerated 9. CAF/CHB s/p PPM:   Monitor HR Off amiodarone and Entresto due to issues with hypotension.   Controlled on 1/27 11 COPD/OSA: Duonebs and incruse resumed. Encourage IS and keep HOB>30 degrees.   Communicated medication adjustments with respiratory therapy 12. Depression/anxiety disorder: Emotional support 13. H/o migraines: Has been managed on Topamax and gabapentin.  14. Hypokalemia:   Kdur DC'd on 1/11  Potassium 3.8 on 1/25  Continue to monitor labs 15.  Hyperglycemia-secondary to tube feeds  Labile on 1/27  Continue SSI as needed 16.  Leukocytosis: Resolved  WBCs 9.0 on 1/13  Afebrile  Continue to monitor 17.  Acute blood loss anemia  Hemoglobin 12.0 on 1/25  Continue to monitor 18.  Hypoalbuminemia  Adjust with tube feeds 19.  Acute on chronic combined congestive heart failure  EF 25-30%  Daily weights  No sign of fluid overload at this time, continue to monitor Filed Weights   06/01/19 0500 06/02/19 0500 06/03/19 0500  Weight: 102.7  kg 101 kg 101.5 kg   Lasix 20 daily started on 1/19, increased on 1/20, increased to 40 twice daily  Free water flushes decreased on 1/22---hold on 1/23  -pt is on 40mg  am and 20mg  pm lasix at home  ?Stable on 1/27 20.  Loose stools  Discussed changing tube feeds with dietary  KUB reviewed, unremarkable  Improved after completion of abx 21.  Post stroke dysphagia  Continue tube feeds  Advance D1 pudding  Plan for MBS tomorrow  Continue to advance as tolerated      LOS: 21 days A FACE TO FACE EVALUATION WAS PERFORMED  Afia Messenger 06/03/2019, 8:37 AM

## 2019-06-04 ENCOUNTER — Ambulatory Visit (HOSPITAL_COMMUNITY): Payer: Medicaid Other

## 2019-06-04 ENCOUNTER — Inpatient Hospital Stay (HOSPITAL_COMMUNITY): Payer: Medicaid Other

## 2019-06-04 ENCOUNTER — Encounter (HOSPITAL_COMMUNITY): Payer: Medicaid Other | Admitting: Speech Pathology

## 2019-06-04 ENCOUNTER — Encounter (HOSPITAL_COMMUNITY): Payer: Medicaid Other | Admitting: Occupational Therapy

## 2019-06-04 LAB — GLUCOSE, CAPILLARY
Glucose-Capillary: 124 mg/dL — ABNORMAL HIGH (ref 70–99)
Glucose-Capillary: 88 mg/dL (ref 70–99)
Glucose-Capillary: 84 mg/dL (ref 70–99)
Glucose-Capillary: 90 mg/dL (ref 70–99)

## 2019-06-04 LAB — PROTIME-INR
INR: 2.8 — ABNORMAL HIGH (ref 0.8–1.2)
Prothrombin Time: 29.4 seconds — ABNORMAL HIGH (ref 11.4–15.2)

## 2019-06-04 MED ORDER — ROSUVASTATIN CALCIUM 20 MG PO TABS
40.0000 mg | ORAL_TABLET | Freq: Every day | ORAL | Status: DC
  Administered 2019-06-05 – 2019-06-09 (×5): 40 mg via ORAL
  Filled 2019-06-04 (×5): qty 2

## 2019-06-04 MED ORDER — TOPIRAMATE 25 MG PO TABS
50.0000 mg | ORAL_TABLET | Freq: Every day | ORAL | Status: DC
  Administered 2019-06-05 – 2019-06-09 (×5): 50 mg via ORAL
  Filled 2019-06-04 (×5): qty 2

## 2019-06-04 MED ORDER — SENNOSIDES-DOCUSATE SODIUM 8.6-50 MG PO TABS: 1.0000 | ORAL_TABLET | Freq: Every evening | ORAL | Status: DC | PRN

## 2019-06-04 MED ORDER — INSULIN ASPART 100 UNIT/ML ~~LOC~~ SOLN: 0.0000 [IU] | Freq: Three times a day (TID) | SUBCUTANEOUS | Status: DC

## 2019-06-04 MED ORDER — GABAPENTIN 300 MG PO CAPS
300.0000 mg | ORAL_CAPSULE | Freq: Three times a day (TID) | ORAL | Status: DC
  Administered 2019-06-04 – 2019-06-09 (×15): 300 mg via ORAL
  Filled 2019-06-04 (×15): qty 1

## 2019-06-04 MED ORDER — INSULIN ASPART 100 UNIT/ML ~~LOC~~ SOLN: 0.0000 [IU] | Freq: Every day | SUBCUTANEOUS | Status: DC

## 2019-06-04 MED ORDER — ASPIRIN 81 MG PO CHEW
81.0000 mg | CHEWABLE_TABLET | Freq: Every day | ORAL | Status: DC
  Administered 2019-06-05 – 2019-06-09 (×5): 81 mg via ORAL
  Filled 2019-06-04 (×5): qty 1

## 2019-06-04 MED ORDER — LEVOTHYROXINE SODIUM 50 MCG PO TABS
50.0000 ug | ORAL_TABLET | Freq: Every day | ORAL | Status: DC
  Administered 2019-06-05 – 2019-06-09 (×5): 50 ug via ORAL
  Filled 2019-06-04 (×6): qty 1

## 2019-06-04 MED ORDER — WARFARIN SODIUM 7.5 MG PO TABS
7.5000 mg | ORAL_TABLET | Freq: Once | ORAL | Status: AC
  Administered 2019-06-04: 18:00:00 7.5 mg via ORAL
  Filled 2019-06-04: qty 1

## 2019-06-04 MED ORDER — FUROSEMIDE 40 MG PO TABS
40.0000 mg | ORAL_TABLET | Freq: Two times a day (BID) | ORAL | Status: DC
  Administered 2019-06-04 – 2019-06-09 (×10): 40 mg via ORAL
  Filled 2019-06-04 (×10): qty 1

## 2019-06-04 NOTE — Progress Notes (Signed)
Speech Language Pathology Weekly Progress and Session Note  Patient Details  Name: Jon [name] MRN: [id number] Date of Birth: Aug 31, 1965  Beginning of progress report period: May 29, 2019 End of progress report period: June 04, 2019  Today's Date: 06/04/2019 SLP Individual Time: 0920-1003 SLP Individual Time Calculation (min): 43 min  Short Term Goals: Week 3: SLP Short Term Goal 1 (Week 3): Pt will consume current diet with minimal overt s/sx aspiration and Supervision A for use of swallow strategies. SLP Short Term Goal 1 - Progress (Week 3): Progressing toward goal SLP Short Term Goal 2 (Week 3): Pt wil consume upgraded trials Dys 2 X2 with efficient mastication and oral clearance X3 prior to solid upgrade. SLP Short Term Goal 2 - Progress (Week 3): Met SLP Short Term Goal 3 (Week 3): During therapeutic trials of thin via teaspoon or small ice chips, pt will implement chin tuck strategy with Min A verbal cues. SLP Short Term Goal 3 - Progress (Week 3): Met SLP Short Term Goal 4 (Week 3): Pt will utilize speech intelligibility strategies (specifically pausing between words and overarticulation) to increase intelligibility to 70% at the word level with Min A verbal cues. SLP Short Term Goal 4 - Progress (Week 3): Progressing toward goal SLP Short Term Goal 5 (Week 3): Pt will demonstrate basic problem solving during functional tasks with Min A verbal/visual cues. SLP Short Term Goal 5 - Progress (Week 3): Met    New Short Term Goals: Week 4: SLP Short Term Goal 1 (Week 4): STG=LTG due to remaining LOS  Weekly Progress Updates: Pt has made functional gains and met 3 out of 5 short term goals. Pt is currently Mod assist due to mod-severe dysarthria impacting speech intelligibility, cognitive impairments impacting problem solving, awareness, and impulsivity, as well as severe oropharyngeal dysphagia. Pt participated in repeat MBSS today (1/28) and is upgraded to Dys 2  (minced/ground) diet with nectar thick liquids (with use of chin tuck and extra dry swallow). Pt has demonstrated improved oropharyngeal swallow function, sustained attention and problem solving, as well as use of speech strategies. Pt and family education is ongoing. Pt would continue to benefit from skilled ST while inpatient in order to maximize functional independence and reduce burden of care prior to discharge. Anticipate that pt will need 24/7 supervision at discharge in addition to [place] follow up at next level of care.      Intensity: Minumum of 1-2 x/day, 30 to 90 minutes Frequency: 3 to 5 out of 7 days Duration/Length of Stay: 06/09/19 Treatment/Interventions: Cognitive remediation/compensation;Internal/external aids;Speech/Language facilitation;Cueing hierarchy;Dysphagia/aspiration precaution training;Functional tasks;Patient/family education;Multimodal communication approach   Daily Session  Skilled Therapeutic Interventions: Pt was seen for skilled ST targeting dysphagia goals and education with pt, his mother, and sister-in-law. Extensive education provided regarding results of pt's MBSS this morning as well as upgrade to Dys 2 (ground) texture, nectar thick liquid diet with use of compensatory strategies (chin tuck and extra dry swallows). Handouts provided to reinforce diet recommendations, swallow strategies, and need for 24/7 supervision. Pt demonstrated use of swallow strategies with nectar thick liquids with Max A multimodal cues with 1 immediate cough throughout intake of 8 oz. Discussed need to minimize distractions and demonstrated how to effectively cue pt for strategies, including tactile assist to implement slow rate as needed. Hands on teaching of how to thicken drinks to nectar consistency provided to sister-in-law using thicken-up thickener. Answered questions regarding foods in and out of compliance with Dys 2 and how to  achieve this consistency by pulsing food processor. Also  reviewed pt's speech intelligibility strategies with family and how fatigue may impact intelligibility. Education regarding pt's impulsivity and attention provided with emphasis on minimizing distractions, no multi-tasking, slowing down, and 24/7 supervision. Pt and family in agreement. Pt left in bed with alarm set and family still present. Continue per current plan of care.         Pain Pain Assessment Pain Scale: 0-10 Pain Score: 0-No pain  Therapy/Group: Individual Therapy  Arbutus Leas 06/04/2019, 11:58 AM

## 2019-06-04 NOTE — Progress Notes (Signed)
Modified Barium Swallow Progress Note  Patient Details  Name: Mortimer Bair MRN: [id number] Date of Birth: February 21, 1966  Today's Date: 06/04/2019  Modified Barium Swallow completed.  Full report located under Chart Review in the Imaging Section.  Brief recommendations include the following:  Clinical Impression   Pt presents with moderate oropharyngeal dysphagia but improved ability to protect airway with use of compensatory strategies since last MBSS. Pt's oral phase is characterized by reduced oral control of boluses and weak lingual manipulation, which results in various levels of lingual residue post-swallow depending on bolus texture as well as premature spillage of liquids. Although Honey thick barium was consumed without any penetration or aspiration when implementing chin tuck, moderate oral residue required dry swallows X3-4 to clear. Nectar thick barium with chin tuck strategy resulted in flash penetration into laryngeal vestibule X1 and penetration above vocal folds X1, but in both instances penetrates were cleared either ejected during initial swallow or with an extra dry swallow. Nectar also resulted in less lingual/palatal residue post swallow in comparison to honey. Swallow initiation of thin barium occurred at the level of the pyriform sinuses and immediate sensed aspiration during the swallow. Significantly reduced oral control of thin presents mod-severe aspiration risk with this texture. Chin tuck was not effective to further protect airway with thin. Pt also exhibited prolonged mastication of Dys 3 (mech soft) solids and moderate oral residue, however airway protection remained in tact. Recommend pt upgrade to Dys 2 (ground/minced) texture diet, nectar thick liquids, meds crushed in puree. Pt MUST use chin tuck, followed by extra dry swallow with nectar. Although chin tuck not required for solids, he must still perform extra dry swallows to assist with oral clearance. Full  supervision should be provided to ensure use of swallow strategies, in addition to assistance with self feeding.   Swallow Evaluation Recommendations       SLP Diet Recommendations: Dysphagia 2 (Fine chop) solids;Nectar thick liquid;Other (Comment)(must use compensatory strategies - chin tuck and extra swallows)   Liquid Administration via: Cup;Spoon   Medication Administration: Crushed with puree   Supervision: Full supervision/cueing for compensatory strategies;Staff to assist with self feeding   Compensations: Slow rate;Small sips/bites;Other (Comment)(chin tuck and extra dry swallow with every sip; extra swallows also required with solids, but no chin tuck)   Postural Changes: Remain semi-upright after after feeds/meals (Comment)   Oral Care Recommendations: Oral care BID   Other Recommendations: Order thickener from pharmacy;Prohibited food (jello, ice cream, thin soups);Remove water pitcher;Have oral suction available    [name] 06/04/2019,10:30 AM

## 2019-06-04 NOTE — Progress Notes (Signed)
Physical Therapy Session Note  Patient Details  Name: Jon [name] MRN: [id number] Date of Birth: 1965/05/16  Today's Date: 06/04/2019 PT Individual Time: 1445-1540 PT Individual Time Calculation (min): 55 min   Short Term Goals: Week 1:  PT Short Term Goal 1 (Week 1): Pt will perform bed mobility with min A PT Short Term Goal 1 - Progress (Week 1): Met PT Short Term Goal 2 (Week 1): Pt will transfer stand<>pivot with LRAD mod A PT Short Term Goal 2 - Progress (Week 1): Met PT Short Term Goal 3 (Week 1): Pt will perform car transfer mod A PT Short Term Goal 3 - Progress (Week 1): Progressing toward goal Week 2:  PT Short Term Goal 1 (Week 2): Pt will perform bed mobility with CGA PT Short Term Goal 1 - Progress (Week 2): Met PT Short Term Goal 2 (Week 2): Pt will perform stand<>pivot transfer with LRAD min A PT Short Term Goal 2 - Progress (Week 2): Met PT Short Term Goal 3 (Week 2): Pt will perform car transfer with LRAD mod A PT Short Term Goal 3 - Progress (Week 2): Met Week 3:  PT Short Term Goal 1 (Week 3): Pt will perform stand<>pivot with LRAD CGA PT Short Term Goal 1 - Progress (Week 3): Progressing toward goal PT Short Term Goal 2 (Week 3): Pt will perform car transfer with LRAD min A PT Short Term Goal 2 - Progress (Week 3): Met PT Short Term Goal 3 (Week 3): Pt will ambulate 115f with LRAD min A PT Short Term Goal 3 - Progress (Week 3): Met  Skilled Therapeutic Interventions/Progress Updates:    PAIN denies pain this pm  Pt initially supine and agreeable to treatment session with focus on balance, global strengthening, functional mobility.  Pt supine to sit w/supervision.  donns shoes in sitting w/supervision.  STS w/RW w/cues for safety.  Gait 2749fw/RW w/constant cues for maintaining safe distance to walker, increasing heel strike, and for improving clearance bilat LE's.  Pt refused rest breaks during gait stating he was "going all the way". HR 77, 02 sats 94%  following extended gait.  STS from wc, sidestepping w/RW w/cues for safety and cga., sits w/supervision. Dynamic balance and cardiovascular endurance as follows: Standing ball toss w/emphasis on overhead reach to encourage upright posture in standing.  2 min x 2 w/cga only Standing w/feet together x 1 min cga Standing ball toss w/feet together 2 min x 2 w/cga only including reaching to floor to retrieve ball.  Performed for balance challenge: Gait 15014f/HHA of 1, cga of 1 and cues to increase heel strike, increase clearance bilat.  Pt without balance loss, demonstrates impulsive behavior such as shaking hips as if dancing  but no loss of balance.  However, gait pattern significantly deteriorateds w/fatigue and pt required min assist of 2 for last 96f73fotal 175ft54fPt transferred to edge of bed w/min assist, removes shoes w/supervision, sit to supine w/supervision, scoots in bed w/supervision.  Pt impulsively returned to sitting on edge of bed stating he had to go to bathroom and required cues to donn shoes and wait for AD/assist.  Pt sts w/RW w/cga, gait 10ft 26fommode w/cga and cues for safety, and transfers to commode w/cga and cues.  Pt able to perform hygiene w/set up assist and cga on commode due to impulsivity.  STS w/cues and cga, raises pants w/min assit, gait 10ft t19fd as above and returns sit, sit to supine,  scoots as described above.  Pt left supine w/rails up x 4, alarm set, bed in lowest position, and needs in reach.    Therapy Documentation Precautions:  Precautions Precautions: Fall Precaution Comments: R hemi, dysarthria, dysphagia, on .5L O2 via [place], NG tube, and NPO Restrictions Weight Bearing Restrictions: No    Therapy/Group: Individual Therapy Callie Fielding, [place] 06/04/2019, 4:02 PM

## 2019-06-04 NOTE — Progress Notes (Signed)
Physical Therapy Session Note  Patient Details  Name: Jon [name] MRN: [id number] Date of Birth: 04-08-1966  Today's Date: 06/04/2019 PT Individual Time: 1015-1100 PT Individual Time Calculation (min): 45 min   Short Term Goals: Week 3:  PT Short Term Goal 1 (Week 3): Pt will perform stand<>pivot with LRAD CGA PT Short Term Goal 1 - Progress (Week 3): Progressing toward goal PT Short Term Goal 2 (Week 3): Pt will perform car transfer with LRAD min A PT Short Term Goal 2 - Progress (Week 3): Met PT Short Term Goal 3 (Week 3): Pt will ambulate 165f with LRAD min A PT Short Term Goal 3 - Progress (Week 3): Met Week 4:  PT Short Term Goal 1 (Week 4): STG=LTG due to LOS  Skilled Therapeutic Interventions/Progress Updates:   Received pt supine in bed, pt agreeable to therapy, and denied any pain during session. Sister in lSports coachand mother present for family education training. Session focused on education, functional mobility/transfers, stair navigation, ambulation, simulated car transfers, balance/coordination, safety precautions, body mechanics and transfer techniques, and endurance with activity. Pt performed bed mobility with supervision and transferred bed<>WC stand<>pivot without AD min A. [facility]. Pt performed 233fambulatory car transfer with RW min A x 1 with therapist and min A x 1 with sister-in-law (PKieth Brightly Pt required verbal cues for RW safety, turning sequence, and step sequence. Pt and family educated on transfer technique/safety and verbalized/demonstrated confidence with task. Pt navigated 4 steps with 2 rails min A x 1 with therapist and x 1 with sister-in-law, ascending and descending with a step to pattern. Pt required verbal cues for foot placement on step. Therapist educated pt and family on techniques; family verbalized understanding/confidence with task. Pt ambulated 15063fith RW with therapist min A x1 and with sister in law  x 1. Family educated on signs to be aware of with pt fatigue such as decreased R LE step length, poor RW safety, and narrow BOS as well as transfer technique, positioning, and body mechanics to ensure patient safety; family verbalized and demonstrated confidence. Pt reported urge to use restroom and was [facility] 27f68fth RW min A to toilet and able to urinate while standing CGA. Pt required CGA for LE clothing and hygiene management. Family verbalized and demonstrated confidence with all discharge tasks today. Family was educated on pt's CLOF and impulsive behaviors and verbalized understanding of strategies to manage. Concluded session with pt sitting in WC, [facility], family present, awaiting OT family education session.   Therapy Documentation Precautions:  Precautions Precautions: Fall Precaution Comments: R hemi, dysarthria, dysphagia, on .5L O2 via [place], NG tube, and NPO Restrictions Weight Bearing Restrictions: No   Therapy/Group: Individual Therapy AnnaAlfonse Alpers DPT   06/04/2019, 7:41 AM

## 2019-06-04 NOTE — Progress Notes (Signed)
ANTICOAGULATION CONSULT NOTE  Pharmacy Consult for Warfarin Indication: stroke and mechanical valve, atrial fibrillation  Allergies  Allergen Reactions  . Hydrocodone-Acetaminophen Other (See Comments)    PT MOTHER DOES NOT REMEMBER REACTION  Only use in Emergency   . Hydromorphone Itching and Rash       . Other Other (See Comments)    ALL NARCOTICS-ITCHING, RASH   . Oxycodone Other (See Comments)    Unknown reaction  . Penicillins Rash    Did it involve swelling of the face/tongue/throat, SOB, or low BP? Unknown Did it involve sudden or severe rash/hives, skin peeling, or any reaction on the inside of your mouth or nose? Unknown Did you need to seek medical attention at a hospital or doctor's office? Unknown When did it last happen?unknown If all above answers are "NO", may proceed with cephalosporin use.  . Dilaudid [Hydromorphone Hcl] Itching and Rash    Patient Measurements: Total Body Weight: 97.2 kg Height: 71 inches  Vital Signs: Temp: 98.1 F (36.7 C) (01/28 0354) BP: 109/49 (01/28 0354) Pulse Rate: 63 (01/28 0354)  Labs: Recent Labs    06/02/19 0539 06/03/19 0500 06/04/19 0531  LABPROT 30.0* 30.7* 29.4*  INR 2.9* 3.0* 2.8*    Estimated Creatinine Clearance: 109.1 mL/min (by C-G formula based on SCr of 0.95 mg/dL).   Assessment: 54 yr old male presented to the ED with stroke symptoms after recent discharge from [facility]. He is supposed to be on warfarin and Lovenox for history of a mechanical valve.  Patient is s/p thrombectomy and s/p bridging with Heparin to therapeutic INR with warfarin. Pharmacy consulted to dose.   INR continues to be therapeutic at 2.8 this AM, no bleeding reported, previous CBCs have been stable.     Goal of Therapy:  INR 2.5-3.5  Monitor platelets by anticoagulation protocol: Yes   Plan:  Warfarin 7.5 mg po x 1 dose tonight Daily INR, s/s bleeding  Jasher Barkan A. Jeanella Craze, PharmD, BCPS, FNKF Clinical  Pharmacist [place] Please utilize Amion for appropriate phone number to reach the unit pharmacist [facility])   06/04/2019 10:16 AM

## 2019-06-04 NOTE — Progress Notes (Signed)
Updated by Dr. Allena Katz that patient has been advanced to dysphagia 2, nectar liquids. Added CM restrictions and changed CBGs to ac/hs to match meals. Changed all meds to oral route--if tolerating this may be able to remove cortak in 24-48 hours.

## 2019-06-04 NOTE — Progress Notes (Signed)
Jon [name] PHYSICAL MEDICINE & REHABILITATION PROGRESS NOTE  Subjective/Complaints: Patient seen sitting up in bed this morning.  He states he slept well overnight.  He states his family is supposed to come in for education today.  Discussed MBS with therapies and PA, plan for 9 AM today.  ROS: Denies CP, SOB, N/V/D  Objective: Vital Signs: Blood pressure (!) 109/49, pulse 63, temperature 98.1 F (36.7 C), resp. rate 20, height 5\' 11"  (1.803 m), weight 101.5 kg, SpO2 92 %. No results found. No results for input(s): WBC, HGB, HCT, PLT in the last 72 hours. No results for input(s): NA, K, CL, CO2, GLUCOSE, BUN, CREATININE, CALCIUM in the last 72 hours.  Physical Exam: BP (!) 109/49 (BP Location: Left Arm)   Pulse 63   Temp 98.1 F (36.7 C)   Resp 20   Ht 5\' 11"  (1.803 m)   Wt 101.5 kg   SpO2 92%   BMI 31.21 kg/m  Constitutional: No distress . Vital signs reviewed. HENT: Normocephalic.  Atraumatic.  + NG. Eyes: EOMI. No discharge. Cardiovascular: No JVD. Respiratory: Normal effort.  No stridor. GI: Non-distended. Skin: Warm and dry.  Intact. Psych: Normal mood.  Normal behavior. Musc: No edema in extremities.  No tenderness in extremities. Neuro: Alert Severe dysarthria, stable Able to follow simple motor commands.  Motor:  RUE: 4-4+/5 proximal distal, unchanged RLE: Hip flexion, knee extension 4 +/5, dorsiflexion 4+/5, stable  Assessment/Plan: 1. 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  Physiatrist is providing close team supervision and 24 hour management of active medical problems listed below.  Physiatrist and rehab team continue to assess barriers to discharge/monitor patient progress toward functional and medical goals  Care Tool:  Bathing    Body parts bathed by patient: Right arm, Left lower leg, Left arm, Chest, Face,  Abdomen, Front perineal area, Right upper leg, Left upper leg, Right lower leg   Body parts bathed by helper: Buttocks     Bathing assist Assist Level: Minimal Assistance - Patient > 75%     Upper Body Dressing/Undressing Upper body dressing   What is the patient wearing?: Pull over shirt    Upper body assist Assist Level: Supervision/Verbal cueing    Lower Body Dressing/Undressing Lower body dressing      What is the patient wearing?: Pants, Underwear/pull up     Lower body assist Assist for lower body dressing: Minimal Assistance - Patient > 75%     Toileting Toileting    Toileting assist Assist for toileting: Contact Guard/Touching assist     Transfers Chair/bed transfer  Transfers assist     Chair/bed transfer assist level: Minimal Assistance - Patient > 75%     Locomotion Ambulation   Ambulation assist   Ambulation activity did not occur: Safety/medical concerns(fatigue, LE weakness, restricted to threapy in room due to IV fluids)  Assist level: Minimal Assistance - Patient > 75% Assistive device: Walker-rolling Max distance: 167ft   Walk 10 feet activity   Assist  Walk 10 feet activity did not occur: Safety/medical concerns(fatigue, LE weakness, restricted to threapy in room due to IV fluids)  Assist level: Minimal Assistance - Patient > 75% Assistive device: Walker-rolling   Walk 50 feet activity   Assist Walk 50 feet with 2 turns activity did not occur: Safety/medical concerns(fatigue, LE weakness, restricted to threapy in room due to IV fluids)  Assist level: Minimal  Assistance - Patient > 75% Assistive device: Walker-rolling    Walk 150 feet activity   Assist Walk 150 feet activity did not occur: Safety/medical concerns(fatigue, LE weakness, restricted to threapy in room due to IV fluids)  Assist level: Minimal Assistance - Patient > 75% Assistive device: Walker-rolling    Walk 10 feet on uneven surface  activity   Assist  Walk 10 feet on uneven surfaces activity did not occur: Safety/medical concerns(fatigue, LE weakness, restricted to threapy in room due to IV fluids)         Wheelchair     Assist Will patient use wheelchair at discharge?: Yes Type of Wheelchair: Manual Wheelchair activity did not occur: Safety/medical concerns(fatigue, R hemi, restricted to threapy in room due to IV fluids)  Wheelchair assist level: Supervision/Verbal cueing Max wheelchair distance: 157ft    Wheelchair 50 feet with 2 turns activity    Assist    Wheelchair 50 feet with 2 turns activity did not occur: Safety/medical concerns(fatigue, R hemi, restricted to threapy in room due to IV fluids)   Assist Level: Supervision/Verbal cueing   Wheelchair 150 feet activity     Assist Wheelchair 150 feet activity did not occur: Safety/medical concerns(fatigue, R hemi, restricted to threapy in room due to IV fluids)   Assist Level: Supervision/Verbal cueing      Medical Problem List and Plan: 1.  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  Continue CIR 2.  Antithrombotics: -DVT/anticoagulation:  Pharmaceutical: Coumadin goal 2.5-3.5.   INR therapeutic 1/28  -antiplatelet therapy: on ASA 3. Pain Management: tylenol prn 4. Mood: LCSW to follow for evaluation and support when appropriate.              -antipsychotic agents: N/A 5. Neuropsych: This patient is not fully capable of making decisions on his own behalf. 6. Skin/Wound Care: Routine pressure relief measures.  7. Fluids/Electrolytes/Nutrition: Monitor electrolytes 8. Aspiration PNA: Completed 7 day course of Unasyn on 1/12. Needs to be upright with pulmonary toilet.    Weaned supplemental oxygen as tolerated 9. CAF/CHB s/p PPM:   Monitor HR Off amiodarone and Entresto due to issues with hypotension.   Controlled on 1/28 11 COPD/OSA:  Duonebs and incruse resumed. Encourage IS and keep HOB>30 degrees.   Communicated medication adjustments with respiratory therapy 12. Depression/anxiety disorder: Emotional support 13. H/o migraines: Has been managed on Topamax and gabapentin.  14. Hypokalemia:   Kdur DC'd on 1/11  Potassium 3.8 on 1/25  Continue to monitor labs 15.  Hyperglycemia-secondary to tube feeds  Relatively controlled on 1/28  Continue SSI as needed 16.  Leukocytosis: Resolved  WBCs 9.0 on 1/13  Afebrile  Continue to monitor 17.  Acute blood loss anemia  Hemoglobin 12.0 on 1/25  Continue to monitor 18.  Hypoalbuminemia  Adjust with tube feeds 19.  Acute on chronic combined congestive heart failure  EF 25-30%  Daily weights  No sign of fluid overload at this time, continue to monitor Filed Weights   06/02/19 0500 06/03/19 0500 06/04/19 0351  Weight: 101 kg 101.5 kg 101.5 kg   Lasix 20 daily started on 1/19, increased on 1/20, increased to 40 twice daily  Free water flushes decreased on 1/22---hold on 1/23  -pt is on 40mg  am and 20mg  pm lasix at home  Stable on 1/28 20.  Loose stools  Discussed changing tube feeds with dietary  KUB reviewed, unremarkable  Improved after completion of  abx 21.  Post stroke dysphagia  Eating variable, but overall moderate  Advanced D1 pudding, MBS today, discussed with therapies  Liquid supplement DC'd, will monitor hydration status and consider alternative means if necessary  BMP ordered for tomorrow  Continue to advance as tolerated      LOS: 22 days A FACE TO FACE EVALUATION WAS PERFORMED  Selinda Korzeniewski Karis Juba 06/04/2019, 9:09 AM

## 2019-06-04 NOTE — Progress Notes (Signed)
Social Work Patient ID: Jon [name], male   DOB: 17-Nov-1965, 54 y.o.   MRN: [id number]  Met with pt, mother and sister-in-law following family ed today.  All pleased with pt's functional level and his upgrade on diet/ liquids.  All report feeling very comfortable with plans for d/c on Tuesdays and still to d/c to brother's home with mother and sister-in-law to provide caregiver support.  Reviewed HH and DME to be ordered.  Reynold Mantell, LCSW

## 2019-06-04 NOTE — Progress Notes (Signed)
Occupational Therapy Session Note  Patient Details  Name: Jon [name] MRN: [id number] Date of Birth: November 23, 1965  Today's Date: 06/04/2019 OT Individual Time: 5732-2025 OT Individual Time Calculation (min): 49 min    Short Term Goals: Week 3:  OT Short Term Goal 1 (Week 3): Pt will complete toilet transfers with min assist ambulating with RW OT Short Term Goal 2 (Week 3): Pt will complete LB dressing to include donning socks/shoes with min assist OT Short Term Goal 3 (Week 3): Pt will complete 2 grooming tasks in standing with min assist for increased standing tolerance/balance  Skilled Therapeutic Interventions/Progress Updates:    Pt seen for OT treatment with pt's sister and his mother present.  Educated pt's sister on use of the wash mit with the RUE secondary to decreased sensation and pt frequently dropping his washcloth when attempting use with the RUE.  Pt's sister completed all hands on assist for toilet and shower tub transfers. Pt's mother did not practice any hands on.   Educated both on correct positioning on the right side during all transfers secondary to possible LOB to the right or backwards. Provided education on tub bench as well as need for supervision throughout bathing tasks.  His sister reports having a hand held shower already and she will also get a mat to put in the tub.  Tub bench will be ordered by SW as well.  Took pt back to the room and educated on use of shoe buttons as pt cannot currently tie his shoes consistently.  Discussed the need to continue practicing tying shoes as well as working on visual attention while completing other tasks at home as well as folding laundry, opening containers, picking up objects with the RUE and placing them from one spot to the other.  Both pt and family voice understanding.  Finished session with pt in the wheelchair and call button and phone in reach with family present.     Therapy Documentation Precautions:   Precautions Precautions: Fall Precaution Comments: R hemi, dysarthria, dysphagia, on .5L O2 via [place], NG tube, and NPO Restrictions Weight Bearing Restrictions: No  Pain: Pain Assessment Pain Scale: Faces Pain Score: 0-No pain Faces Pain Scale: No hurt ADL: See Care Tool Section for some details of mobility and selfcare  Therapy/Group: Individual Therapy  Zala Degrasse OTR/L 06/04/2019, 12:12 PM

## 2019-06-05 ENCOUNTER — Inpatient Hospital Stay (HOSPITAL_COMMUNITY): Payer: Medicaid Other | Admitting: Speech Pathology

## 2019-06-05 ENCOUNTER — Inpatient Hospital Stay (HOSPITAL_COMMUNITY): Payer: Medicaid Other | Admitting: Occupational Therapy

## 2019-06-05 ENCOUNTER — Inpatient Hospital Stay (HOSPITAL_COMMUNITY): Payer: Medicaid Other

## 2019-06-05 LAB — GLUCOSE, CAPILLARY
Glucose-Capillary: 105 mg/dL — ABNORMAL HIGH (ref 70–99)
Glucose-Capillary: 105 mg/dL — ABNORMAL HIGH (ref 70–99)
Glucose-Capillary: 117 mg/dL — ABNORMAL HIGH (ref 70–99)
Glucose-Capillary: 88 mg/dL (ref 70–99)

## 2019-06-05 LAB — BASIC METABOLIC PANEL
Anion gap: 11 (ref 5–15)
BUN: 14 mg/dL (ref 6–20)
CO2: 26 mmol/L (ref 22–32)
Calcium: 9.1 mg/dL (ref 8.9–10.3)
Chloride: 103 mmol/L (ref 98–111)
Creatinine, Ser: 1 mg/dL (ref 0.61–1.24)
GFR calc Af Amer: >60 mL/min (ref >60)
GFR calc non Af Amer: >60 mL/min (ref >60)
Glucose, Bld: 119 mg/dL — ABNORMAL HIGH (ref 70–99)
Potassium: 3.7 mmol/L (ref 3.5–5.1)
Sodium: 140 mmol/L (ref 135–145)

## 2019-06-05 LAB — PROTIME-INR
INR: 2.8 — ABNORMAL HIGH (ref 0.8–1.2)
Prothrombin Time: 29.8 seconds — ABNORMAL HIGH (ref 11.4–15.2)

## 2019-06-05 MED ORDER — WARFARIN SODIUM 7.5 MG PO TABS
7.5000 mg | ORAL_TABLET | Freq: Once | ORAL | Status: AC
  Administered 2019-06-05: 7.5 mg via ORAL
  Filled 2019-06-05: qty 1

## 2019-06-05 NOTE — Progress Notes (Signed)
Occupational Therapy Weekly Progress Note  Patient Details  Name: Jon [name] MRN: [id number] Date of Birth: Oct 07, 1965  Beginning of progress report period: May 29, 2019 End of progress report period: June 05, 2019  Today's Date: 06/05/2019 OT Individual Time: 1347-1500 OT Individual Time Calculation (min): 73 min    Patient has met 2 of 3 short term goals.  Pt is making steady progress towards goals.  Pt currently requires CGA to Min A with RW during functional mobility and self-care tasks due to decreased Lt proprioception, decreased awareness, and impulsivity.  Pt currently able to complete bathing and dressing at [place] assist level for LB and Supervision for UB and grooming tasks and bathroom transfers with Min assist with RW.  Hands on family education completed 06/04/2019 with sister in law completing all hands on and both sister in law and mother reporting understanding of recommendations.  Pt to d/c to brother's home with assist from sister in law.    Patient continues to demonstrate the following deficits: muscle weakness,decreased cardiorespiratoy endurance and decreased oxygen support,impaired timing and sequencing and unbalanced muscle activation,decreased visual acuity,decreased attention, decreased awareness, decreased safety awareness and decreased memoryand decreased sitting balance, decreased standing balance, hemiplegia and decreased balance strategies and therefore will continue to benefit from skilled OT intervention to enhance overall performance with BADL and Reduce care partner burden.  Patient progressing toward long term goals..  Continue plan of care.  OT Short Term Goals Week 3:  OT Short Term Goal 1 (Week 3): Pt will complete toilet transfers with min assist ambulating with RW OT Short Term Goal 1 - Progress (Week 3): Met OT Short Term Goal 2 (Week 3): Pt will complete LB dressing to include donning socks/shoes with min assist OT Short Term Goal 2 -  Progress (Week 3): Met OT Short Term Goal 3 (Week 3): Pt will complete 2 grooming tasks in standing with min assist for increased standing tolerance/balance OT Short Term Goal 3 - Progress (Week 3): Progressing toward goal Week 4:  OT Short Term Goal 1 (Week 4): STG = LTGs due to remaining LOS  Skilled Therapeutic Interventions/Progress Updates:    Treatment session with focus on functional mobility, dynamic standing balance, and functional use of dominant RUE.  Pt received supine in bed agreeable to shower.  Pt ambulated bed > toilet with RW with CGA and min cues for RW placement during mobility.  Pt completed toileting with lateral leans and supervision.  Ambulated to shower chair with Min assist without AD.  Completed bathing overall Supervision with exception of CGA when standing to wash buttocks.  Much improved success with bathing when utilizing wash mitt, to eliminate dropping of washcloth.  Completed dressing with min assist to pull underwear over Rt hip after multiple unsuccessful attempts.  Pt able to don socks and shoes and fasten with use of shoe buttons with min cues for completion.  Completed oral care in sitting for energy conservation.  Ambulated 150' x2 with RW with min assist and max multimodal cues for safety as pt with tendency to push RW too far in front of self during ambulation.  Returned to room and left upright in bed with all needs in reach.  Therapy Documentation Precautions:  Precautions Precautions: Fall Precaution Comments: R hemi, dysarthria, dysphagia, on .5L O2 via [place], NG tube, and NPO Restrictions Weight Bearing Restrictions: No General:   Vital Signs: Therapy Vitals Temp: 98.3 F (36.8 C) Pulse Rate: 61 Resp: 18 BP: 97/64 Patient Position (if  appropriate): Lying Oxygen Therapy SpO2: 90 % O2 Device: Room Air Pain:   Pt with no c/o pain  Therapy/Group: Individual Therapy  Simonne Come 06/05/2019, 8:35 AM

## 2019-06-05 NOTE — Progress Notes (Signed)
[facility] PHYSICAL MEDICINE & REHABILITATION PROGRESS NOTE  Subjective/Complaints: Patient seen sitting up in bed at 2355 overnight.  He states he is eating and drinking more fluids and.  Discussed need for oral nutrition/fluid maintenance avoid PEG.  ROS: Denies CP, SOB, N/V/D  Objective: Vital Signs: Blood pressure 97/64, pulse 61, temperature 98.3 F (36.8 C), resp. rate 18, height 5\' 11"  (1.803 m), weight 101.4 kg, SpO2 90 %. DG Swallowing Func-Speech Pathology  Result Date: 06/04/2019 Objective Swallowing Evaluation: Type of Study: MBS-Modified Barium Swallow Study  Patient Details Name: Jon [name] MRN: [name] Date of Birth: [birth year] Today's Date: 06/04/2019 06/06/2019 11 mins Past Medical History: Past Medical History: Diagnosis Date . Aneurysm of thoracic aorta (HCC) 05/06/2012  IMO routine update IMO routine update . Anxiety   sees Dr. 05/08/2012 . Aortic aneurysm and dissection (HCC) 05/06/2012 . Aortic stenosis   mechanical AVR 04/22/12 . Aortic valve disorder 05/06/2012 . 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 Bicuspid aortic valve  . Cerebral infarction (HCC) 05/11/2012 . Cerebral thrombosis with cerebral infarction (HCC) 07/14/2013 . Cholelithiasis 12/20/2016 . Current use of long term anticoagulation 12/20/2016 . CVA (cerebral infarction)   occipital CVA 12/13 . Dissecting aortic aneurysm (HCC) 05/06/2012 . Dysarthria 09/10/2017 . Dysrhythmia   sees Dr. 11/10/2017, [facility] cardiology in [place] . Generalized constriction of visual field 10/15/2012  IMOUPDATE IMOUPDATE . 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 GERD (gastroesophageal reflux disease)  . HCAP (healthcare-associated pneumonia) 05/06/2012 . Headache(784.0)   "due to vision problem" . History of CVA (cerebrovascular accident) 05/07/2012 . Hypothyroidism  . Idiopathic peripheral neuropathy 07/14/2013 . Late effects of CVA (cerebrovascular accident) 08/08/2015 . Migraine  . Nontraumatic  multiple localized intracerebral hemorrhages (HCC) 10/16/2017 . Paroxysmal atrial fibrillation (HCC) 08/08/2015  On warfarin On warfarin . Presence of prosthetic heart valve 08/08/2015 . Shortness of breath  . Stroke (HCC)   x3 . Stroke, embolic (HCC) 05/07/2012 . Syncope 05/06/2012 . Thoracic aortic aneurysm (HCC)   replacement aortic root 04/22/12 . VBI (vertebrobasilar insufficiency) 09/10/2017 Past Surgical History: Past Surgical History: Procedure Laterality Date . APPLICATION OF WOUND VAC  06/11/2012  Procedure: APPLICATION OF WOUND VAC;  Surgeon: 08/09/2012, MD;  Location: [facility] OR;  Service: Vascular;  Laterality: Right; . CARDIAC CATHETERIZATION   . CHOLECYSTECTOMY   . CORONARY ARTERY BYPASS GRAFT  04/22/2012  Procedure: CORONARY ARTERY BYPASS GRAFTING (CABG);  Surgeon: 04/24/2012, MD;  Location: [facility] OR;  Service: Open Heart Surgery;  Laterality: N/A;  times one with right greater saphenous vein graft . I & D EXTREMITY  06/11/2012  Procedure: IRRIGATION AND DEBRIDEMENT EXTREMITY;  Surgeon: 08/09/2012, MD;  Location: [facility] OR;  Service: Vascular;  Laterality: Right; . INTRAOPERATIVE TRANSESOPHAGEAL ECHOCARDIOGRAM  04/22/2012  Procedure: INTRAOPERATIVE TRANSESOPHAGEAL ECHOCARDIOGRAM;  Surgeon: 04/24/2012, MD;  Location: [facility] OR;  Service: Open Heart Surgery;  Laterality: N/A; . IR CT HEAD LTD  05/08/2019 . IR PERCUTANEOUS ART THROMBECTOMY/INFUSION INTRACRANIAL INC DIAG ANGIO  05/08/2019 . IR 07/06/2019 GUIDE VASC ACCESS RIGHT  05/08/2019 . PERMANENT PACEMAKER INSERTION  10/2018  [employer] dual PPM . RADIOLOGY WITH ANESTHESIA N/A 05/08/2019  Procedure: IR WITH ANESTHESIA;  Surgeon: Radiologist, Medication, MD;  Location: [facility] OR;  Service: Radiology;  Laterality: N/A; . ROTATOR CUFF REPAIR    12 years ago, Left . THORACIC AORTIC ANEURYSM REPAIR  04/22/2012  Procedure: THORACIC ASCENDING ANEURYSM REPAIR (AAA);  Surgeon: 04/24/2012, MD;  Location: [facility] OR;  Service: Open Heart  Surgery;  Laterality: N/A;  HPI: 54 yo admitted with right weakness and left gaze with aphasia with acute left ACA anterior territory CVA s/p revascularization. VDRF 1/1-1/2. PMhx: dysarthria, aortic dissection s/p repair, AVR, GERD, CVA x 3. Prior CVA resulted in verbosity, cognitive impairment including impulsivity, mild dysphagia. Admitted to CIR 05/13/19.  No data recorded Assessment / Plan / Recommendation CHL IP CLINICAL IMPRESSIONS 06/04/2019 Clinical Impression --Pt presents with moderate oropharyngeal dysphagia but improved ability to protect airway with use of compensatory strategies since last MBSS. Pt's oral phase is characterized by reduced oral control of boluses and weak lingual manipulation, which results in various levels of lingual residue post-swallow depending on bolus texture as well as premature spillage of liquids. Although Honey thick barium was consumed without any penetration or aspiration when implementing chin tuck, moderate oral residue required dry swallows X3-4 to clear. Nectar thick barium with chin tuck strategy resulted in flash penetration into laryngeal vestibule X1 and penetration above vocal folds X1, but in both instances penetrates were cleared either ejected during initial swallow or with an extra dry swallow. Nectar also resulted in less lingual/palatal residue post swallow in comparison to honey. Swallow initiation of thin barium occurred at the level of the pyriform sinuses and immediate sensed aspiration during the swallow. Significantly reduced oral control of thin presents mod-severe aspiration risk with this texture. Chin tuck was not effective to further protect airway with thin. Pt also exhibited prolonged mastication of Dys 3 (mech soft) solids and moderate oral residue, however airway protection remained in tact. Recommend pt upgrade to Dys 2 (ground/minced) texture diet, nectar thick liquids, meds crushed in puree. Pt MUST use chin tuck, followed by extra dry swallow with nectar. Although chin  tuck not required for solids, he must still perform extra dry swallows to assist with oral clearance. Full supervision should be provided to ensure use of swallow strategies, in addition to assistance with self feeding. SLP Visit Diagnosis Dysphagia, oropharyngeal phase (R13.12) Attention and concentration deficit following -- Frontal lobe and executive function deficit following -- Impact on safety and function Moderate aspiration risk;Risk for inadequate nutrition/hydration   CHL IP TREATMENT RECOMMENDATION 06/04/2019 Treatment Recommendations F/U MBS in --- days (Comment);Therapy as outlined in treatment plan below   Prognosis 06/04/2019 Prognosis for Safe Diet Advancement Good Barriers to Reach Goals Cognitive deficits;Behavior Barriers/Prognosis Comment -- CHL IP DIET RECOMMENDATION 06/04/2019 SLP Diet Recommendations Dysphagia 2 (Fine chop) solids;Nectar thick liquid;Other (Comment) Liquid Administration via Cup;Spoon Medication Administration Crushed with puree Compensations Slow rate;Small sips/bites;Other (Comment) Postural Changes Remain semi-upright after after feeds/meals (Comment)   CHL IP OTHER RECOMMENDATIONS 06/04/2019 Recommended Consults -- Oral Care Recommendations Oral care BID Other Recommendations Order thickener from pharmacy;Prohibited food (jello, ice cream, thin soups);Remove water pitcher;Have oral suction available   CHL IP FOLLOW UP RECOMMENDATIONS 06/04/2019 Follow up Recommendations Home health SLP   CHL IP FREQUENCY AND DURATION 05/10/2019 Speech Therapy Frequency (ACUTE ONLY) min 2x/week Treatment Duration --      CHL IP ORAL PHASE 06/04/2019 Oral Phase Impaired Oral - Pudding Teaspoon -- Oral - Pudding Cup -- Oral - Honey Teaspoon NT Oral - Honey Cup Lingual pumping;Decreased bolus cohesion;Premature spillage;Lingual/palatal residue Oral - Nectar Teaspoon NT Oral - Nectar Cup Premature spillage;Lingual/palatal residue;Decreased bolus cohesion Oral - Nectar Straw -- Oral - Thin Teaspoon NT  Oral - Thin Cup Decreased bolus cohesion;Premature spillage Oral - Thin Straw NT Oral - Puree NT Oral - Mech Soft Decreased bolus cohesion;Delayed oral transit;Impaired mastication;Weak lingual manipulation;Lingual/palatal residue  Oral - Regular -- Oral - Multi-Consistency -- Oral - Pill -- Oral Phase - Comment --  CHL IP PHARYNGEAL PHASE 06/04/2019 Pharyngeal Phase Impaired Pharyngeal- Pudding Teaspoon -- Pharyngeal -- Pharyngeal- Pudding Cup -- Pharyngeal -- Pharyngeal- Honey Teaspoon NT Pharyngeal -- Pharyngeal- Honey Cup Reduced epiglottic inversion;Delayed swallow initiation-vallecula;Compensatory strategies attempted (with notebox) Pharyngeal -- Pharyngeal- Nectar Teaspoon NT Pharyngeal -- Pharyngeal- Nectar Cup Delayed swallow initiation-vallecula;Reduced epiglottic inversion;Penetration/Aspiration during swallow;Compensatory strategies attempted (with notebox) Pharyngeal Material enters airway, remains ABOVE vocal cords then ejected out Pharyngeal- Nectar Straw -- Pharyngeal -- Pharyngeal- Thin Teaspoon NT Pharyngeal -- Pharyngeal- Thin Cup Delayed swallow initiation-pyriform sinuses;Reduced airway/laryngeal closure;Penetration/Aspiration during swallow;Compensatory strategies attempted (with notebox) Pharyngeal Material enters airway, passes BELOW cords and not ejected out despite cough attempt by patient Pharyngeal- Thin Straw NT Pharyngeal -- Pharyngeal- Puree NT Pharyngeal -- Pharyngeal- Mechanical Soft WFL Pharyngeal -- Pharyngeal- Regular -- Pharyngeal -- Pharyngeal- Multi-consistency -- Pharyngeal -- Pharyngeal- Pill -- Pharyngeal -- Pharyngeal Comment --  CHL IP CERVICAL ESOPHAGEAL PHASE 06/04/2019 Cervical Esophageal Phase WFL Pudding Teaspoon -- Pudding Cup -- Honey Teaspoon -- Honey Cup -- Nectar Teaspoon -- Nectar Cup -- Nectar Straw -- Thin Teaspoon -- Thin Cup -- Thin Straw -- Puree -- Mechanical Soft -- Regular -- Multi-consistency -- Pill -- Cervical Esophageal Comment -- Arbutus Leas  06/04/2019, 11:52 AM              No results for input(s): WBC, HGB, HCT, PLT in the last 72 hours. Recent Labs    06/05/19 0553  NA 140  K 3.7  CL 103  CO2 26  GLUCOSE 119*  BUN 14  CREATININE 1.00  CALCIUM 9.1    Physical Exam: BP 97/64 (BP Location: Left Arm)   Pulse 61   Temp 98.3 F (36.8 C)   Resp 18   Ht 5\' 11"  (1.803 m)   Wt 101.4 kg   SpO2 90%   BMI 31.18 kg/m  Constitutional: No distress . Vital signs reviewed. HENT: Normocephalic.  Atraumatic.  + NG. Eyes: EOMI. No discharge. Cardiovascular: No JVD. Respiratory: Normal effort.  No stridor. GI: Non-distended. Skin: Warm and dry.  Intact. Psych: Normal mood.  Normal behavior. Musc: No edema in extremities.  No tenderness in extremities. Neuro: Alert Severe dysarthria, unchanged Able to follow simple motor commands.  Motor:  RUE: 4-4+/5 proximal distal, stable RLE: Hip flexion, knee extension 4 +/5, dorsiflexion 4+/5, stable  Assessment/Plan: 1. 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  Physiatrist is providing close team supervision and 24 hour management of active medical problems listed below.  Physiatrist and rehab team continue to assess barriers to discharge/monitor patient progress toward functional and medical goals  Care Tool:  Bathing    Body parts bathed by patient: Right arm, Left lower leg, Left arm, Chest, Face, Abdomen, Front perineal area, Right upper leg, Left upper leg, Right lower leg   Body parts bathed by helper: Buttocks     Bathing assist Assist Level: Minimal Assistance - Patient > 75%     Upper Body Dressing/Undressing Upper body dressing   What is the patient wearing?: Pull over shirt    Upper body assist Assist Level: Supervision/Verbal cueing    Lower Body Dressing/Undressing Lower body dressing      What is the patient  wearing?: Pants, Underwear/pull up     Lower body assist Assist for lower body dressing:  Minimal Assistance - Patient > 75%     Toileting Toileting    Toileting assist Assist for toileting: Contact Guard/Touching assist     Transfers Chair/bed transfer  Transfers assist     Chair/bed transfer assist level: Contact Guard/Touching assist(and cues for safety)     Locomotion Ambulation   Ambulation assist   Ambulation activity did not occur: Safety/medical concerns(fatigue, LE weakness, restricted to threapy in room due to IV fluids)  Assist level: Minimal Assistance - Patient > 75%(cues for safety) Assistive device: Walker-rolling Max distance: 275   Walk 10 feet activity   Assist  Walk 10 feet activity did not occur: Safety/medical concerns(fatigue, LE weakness, restricted to threapy in room due to IV fluids)  Assist level: Contact Guard/Touching assist Assistive device: Walker-rolling   Walk 50 feet activity   Assist Walk 50 feet with 2 turns activity did not occur: Safety/medical concerns(fatigue, LE weakness, restricted to threapy in room due to IV fluids)  Assist level: Contact Guard/Touching assist Assistive device: Walker-rolling    Walk 150 feet activity   Assist Walk 150 feet activity did not occur: Safety/medical concerns(fatigue, LE weakness, restricted to threapy in room due to IV fluids)  Assist level: Minimal Assistance - Patient > 75% Assistive device: Walker-rolling    Walk 10 feet on uneven surface  activity   Assist Walk 10 feet on uneven surfaces activity did not occur: Safety/medical concerns(fatigue, LE weakness, restricted to threapy in room due to IV fluids)         Wheelchair     Assist Will patient use wheelchair at discharge?: Yes Type of Wheelchair: Manual Wheelchair activity did not occur: Safety/medical concerns(fatigue, R hemi, restricted to threapy in room due to IV fluids)  Wheelchair assist level:  Supervision/Verbal cueing Max wheelchair distance: 153ft    Wheelchair 50 feet with 2 turns activity    Assist    Wheelchair 50 feet with 2 turns activity did not occur: Safety/medical concerns(fatigue, R hemi, restricted to threapy in room due to IV fluids)   Assist Level: Supervision/Verbal cueing   Wheelchair 150 feet activity     Assist Wheelchair 150 feet activity did not occur: Safety/medical concerns(fatigue, R hemi, restricted to threapy in room due to IV fluids)   Assist Level: Supervision/Verbal cueing      Medical Problem List and Plan: 1.  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  Continue CIR 2.  Antithrombotics: -DVT/anticoagulation:  Pharmaceutical: Coumadin goal 2.5-3.5.   INR therapeutic 1/29  -antiplatelet therapy: on ASA 3. Pain Management: tylenol prn 4. Mood: LCSW to follow for evaluation and support when appropriate.              -antipsychotic agents: N/A 5. Neuropsych: This patient is not fully capable of making decisions on his own behalf. 6. Skin/Wound Care: Routine pressure relief measures.  7. Fluids/Electrolytes/Nutrition: Monitor electrolytes 8. Aspiration PNA: Completed 7 day course of Unasyn on 1/12. Needs to be upright with pulmonary toilet.    Weaned supplemental oxygen as tolerated 9. CAF/CHB s/p PPM:   Monitor HR Off amiodarone and Entresto due to issues with hypotension.   Controlled on 1/29 11 COPD/OSA: Duonebs and incruse resumed. Encourage IS and keep HOB>30 degrees.   Communicated medication adjustments with respiratory therapy 12. Depression/anxiety disorder: Emotional support 13. H/o migraines: Has been managed on Topamax and gabapentin.  14. Hypokalemia: Resolved  Kdur DC'd on 1/11  Potassium 3.7 on 1/29  Continue to monitor labs 15.  Hyperglycemia-secondary to tube feeds  Relatively controlled on  1/29  Continue SSI as needed 16.  Leukocytosis: Resolved  WBCs 9.0 on 1/13  Afebrile  Continue to monitor 17.  Acute blood loss anemia  Hemoglobin 12.0 on 1/25  Continue to monitor 18.  Hypoalbuminemia  Adjust with tube feeds 19.  Acute on chronic combined congestive heart failure  EF 25-30%  Daily weights  No sign of fluid overload at this time, continue to monitor [facility] Weights   06/03/19 0500 06/04/19 0351 06/05/19 0459  Weight: 101.5 kg 101.5 kg 101.4 kg   Lasix 20 daily started on 1/19, increased on 1/20, increased to 40 twice daily  Free water flushes decreased on 1/22---hold on 1/23  -pt is on 40mg  am and 20mg  pm lasix at home  Stable on 1/29 20.  Loose stools  Discussed changing tube feeds with dietary  KUB reviewed, unremarkable  Improved after completion of abx 21.  Post stroke dysphagia  Eating variable, but overall moderate  Advanced D1 pudding, advanced to D2 nectar  Liquid supplement DC'd, DC NG on 1/29   Will monitor intake through weekend- labs ordered for Monday  Continue to advance diet as tolerated      LOS: 23 days A FACE TO FACE EVALUATION WAS PERFORMED  Tremayne Sheldon 2/29 06/05/2019, 8:33 AM

## 2019-06-05 NOTE — Progress Notes (Signed)
Physical Therapy Session Note  Patient Details  Name: Jon [name] MRN: [id number] Date of Birth: Jul 24, 1965  Today's Date: 06/05/2019 PT Individual Time: 1100-1156 PT Individual Time Calculation (min): 56 min   Short Term Goals: Week 3:  PT Short Term Goal 1 (Week 3): Pt will perform stand<>pivot with LRAD CGA PT Short Term Goal 1 - Progress (Week 3): Progressing toward goal PT Short Term Goal 2 (Week 3): Pt will perform car transfer with LRAD min A PT Short Term Goal 2 - Progress (Week 3): Met PT Short Term Goal 3 (Week 3): Pt will ambulate 127f with LRAD min A PT Short Term Goal 3 - Progress (Week 3): Met Week 4:  PT Short Term Goal 1 (Week 4): STG=LTG due to LOS  Skilled Therapeutic Interventions/Progress Updates:   Received pt supine in bed, pt agreeable to therapy, and denied any pain throughout session. Pt on RA during session. Session focused on functional mobility/transfers, ambulation, LE strength, balance/coordination, NMR, and improved endurance with activity. Pt ambulated 1550fwith RW CGA/min A the therapy gym. Pt performed standing ball toss with therapy tech without AD 2.5 minutes x 2 trials min A/CGA. Pt performed obstacle course weaving in and out of cones, stepping over obstacle, and alternating toe taps x5 on 6in step with RW min A. Pt demonstrated narrow BOS with turns through cones, decreased safety awareness pushing the RW too far forward, and shuffling gait pattern. Therapist provided verbal cues and educated pt on safety awareness, however pt ultimately ignoring therapist. Pt performed forward/backwards tandem walking inside parallel bars with bilateral UE support min A x 2 laps. Pt ambulated additional 10079fith RW min A to dayroom. Pt performed standing cornhole tosses with R UE x 4 games playing against therapy tech without AD min A. Pt demonstrated 1 significant anterior LOB requiring max A to correct. Pt completely unaware of balance deficits despite maximal  education. Pt encouraged to take multiple rest breaks throughout session due to increased fatigue. Pt ambulated additional 150f32fth RW min A back to room. Pt required verbal and tactile cues for RW safety, increased R LE step length, and upright posture. Concluded session with pt supine in bed, needs within reach, and bed alarm on. NT present assisting pt with drinking orange juice.    Therapy Documentation Precautions:  Precautions Precautions: Fall Precaution Comments: R hemi, dysarthria, dysphagia, on .5L O2 via [place], NG tube, and NPO Restrictions Weight Bearing Restrictions: No   Therapy/Group: Individual Therapy AnnaAlfonse Alpers DPT   06/05/2019, 7:48 AM

## 2019-06-05 NOTE — Progress Notes (Signed)
ANTICOAGULATION CONSULT NOTE  Pharmacy Consult for Warfarin Indication: stroke and mechanical valve, atrial fibrillation  Allergies  Allergen Reactions  . Hydrocodone-Acetaminophen Other (See Comments)    PT MOTHER DOES NOT REMEMBER REACTION  Only use in Emergency   . Hydromorphone Itching and Rash       . Other Other (See Comments)    ALL NARCOTICS-ITCHING, RASH   . Oxycodone Other (See Comments)    Unknown reaction  . Penicillins Rash    Did it involve swelling of the face/tongue/throat, SOB, or low BP? Unknown Did it involve sudden or severe rash/hives, skin peeling, or any reaction on the inside of your mouth or nose? Unknown Did you need to seek medical attention at a hospital or doctor's office? Unknown When did it last happen?unknown If all above answers are "NO", may proceed with cephalosporin use.  . Dilaudid [Hydromorphone Hcl] Itching and Rash    Patient Measurements: Total Body Weight: 97.2 kg Height: 71 inches  Vital Signs: Temp: 98.3 F (36.8 C) (01/29 0459) BP: 97/64 (01/29 0459) Pulse Rate: 61 (01/29 0459)  Labs: Recent Labs    06/03/19 0500 06/04/19 0531 06/05/19 0553  LABPROT 30.7* 29.4* 29.8*  INR 3.0* 2.8* 2.8*  CREATININE  --   --  1.00    Estimated Creatinine Clearance: 103.6 mL/min (by C-G formula based on SCr of 1 mg/dL).   Assessment: 54 yr old male presented to the ED with stroke symptoms after recent discharge from [facility]. He is supposed to be on warfarin and Lovenox for history of a mechanical valve.  Patient is s/p thrombectomy and s/p bridging with Heparin to therapeutic INR with warfarin. Pharmacy consulted to dose.   INR continues to be therapeutic at 2.8 again this AM, no bleeding reported, previous CBCs have been stable.     Goal of Therapy:  INR 2.5-3.5  Monitor platelets by anticoagulation protocol: Yes   Plan:  Warfarin 7.5 mg po x 1 dose tonight Daily INR, s/s bleeding  Ayjah Show A. Jeanella Craze, PharmD,  BCPS, FNKF Clinical Pharmacist [place] Please utilize Amion for appropriate phone number to reach the unit pharmacist [facility])   06/05/2019 9:48 AM

## 2019-06-05 NOTE — Plan of Care (Signed)
  Problem: Consults Goal: RH STROKE PATIENT EDUCATION Description: See Patient Education module for education specifics  Outcome: Progressing Goal: Nutrition Consult-if indicated Outcome: Progressing Goal: Diabetes Guidelines if Diabetic/Glucose > 140 Description: If diabetic or lab glucose is > 140 mg/dl - Initiate Diabetes/Hyperglycemia Guidelines & Document Interventions  Outcome: Progressing   Problem: RH BOWEL ELIMINATION Goal: RH STG MANAGE BOWEL WITH ASSISTANCE Description: STG Manage Bowel with mod Assistance. Outcome: Progressing Goal: RH STG MANAGE BOWEL W/MEDICATION W/ASSISTANCE Description: STG Manage Bowel with Medication with min Assistance. Outcome: Progressing   Problem: RH BLADDER ELIMINATION Goal: RH STG MANAGE BLADDER WITH ASSISTANCE Description: STG Manage Bladder With mod Assistance Outcome: Progressing Goal: RH STG MANAGE BLADDER WITH MEDICATION WITH ASSISTANCE Description: STG Manage Bladder With Medication With min Assistance. Outcome: Progressing   Problem: RH SKIN INTEGRITY Goal: RH STG SKIN FREE OF INFECTION/BREAKDOWN Description: Skin to remain free from infection and breakdown while rehab with min assist. Outcome: Progressing Goal: RH STG ABLE TO PERFORM INCISION/WOUND CARE W/ASSISTANCE Description: STG Able To Perform Incision/Wound Care With min Assistance. Outcome: Progressing   Problem: RH SAFETY Goal: RH STG ADHERE TO SAFETY PRECAUTIONS W/ASSISTANCE/DEVICE Description: STG Adhere to Safety Precautions With mod Assistance and appropriate assistive Device. Outcome: Progressing   Problem: RH PAIN MANAGEMENT Goal: RH STG PAIN MANAGED AT OR BELOW PT'S PAIN GOAL Description: <3 on a 0-10 pain scale Outcome: Progressing   Problem: RH KNOWLEDGE DEFICIT Goal: RH STG INCREASE KNOWLEDGE OF DIABETES Description: Patient and caregiver will increase knowledge on diabetic medications, dietary restrictions, and follow-up care with the MD post discharge  with min assist from staff. Outcome: Progressing Goal: RH STG INCREASE KNOWLEDGE OF HYPERTENSION Description: Patient and caregiver will increase knowledge on HTN medications, dietary restrictions, and follow-up care with the MD post discharge with min assist from staff.  Outcome: Progressing Goal: RH STG INCREASE KNOWLEDGE OF DYSPHAGIA/FLUID INTAKE Description: Patient and caregiver will increase knowledge of safe swallowing techniques to prevent aspiration pneumonia; increase knowledge of dysphagia diets, and thickened liquids with min assist from staff.  Outcome: Progressing Goal: RH STG INCREASE KNOWLEGDE OF HYPERLIPIDEMIA Description: Patient and caregiver will increase knowledge on HLD medications, dietary restrictions, and follow-up care with the MD post discharge with min assist from staff.  Outcome: Progressing Goal: RH STG INCREASE KNOWLEDGE OF STROKE PROPHYLAXIS Description: Patient and caregiver will increase knowledge on Stroke prevention medications, dietary restrictions, and follow-up care with the MD post discharge with min assist from staff.  Outcome: Progressing   Problem: RH Vision Goal: RH LTG Vision (Specify) Outcome: Progressing   

## 2019-06-05 NOTE — Progress Notes (Signed)
Speech Language Pathology Daily Session Note  Patient Details  Name: Camari Wisham MRN: [id number] Date of Birth: Aug 25, 1965  Today's Date: 06/05/2019 SLP Individual Time: 1216-2446 SLP Individual Time Calculation (min): 45 min  Short Term Goals: Week 4: SLP Short Term Goal 1 (Week 4): STG=LTG due to remaining LOS  Skilled Therapeutic Interventions:  Skilled treatment session targeted dysphagia and communication goals. SLP provided printed words (from [facility]) for pt to read as barrier task to assess speech intelligibility. Pt unable to read most words presented therefore it was not a fair assessment of speech intelligibility. During less structured communication task, pt with attempt to increase voice intensity and to also over-articulation but was not able to pace his words which reduced speech intelligibility to ~ 40% at sentence to simple conversation. When cued to "say just this part over" pt able to utilize pacing and increase speech intelligibility.   SLP provided skilled observation of pt consuming nectar thick liquids. Pt with no use of safe swallow strategies. Despite Maximal verbal cues, pt with use of chin tuck ~ 25% of time and no dry swallow. Despite education, pt didn't appear motivated or concerned with aspiration risk.   Pt was left upright in bed, bed alarm on and all needs within reach.      Pain Pain Assessment Pain Scale: 0-10 Pain Score: 0-No pain  Therapy/Group: Individual Therapy  Leondra Cullin 06/05/2019, 10:56 AM

## 2019-06-06 ENCOUNTER — Inpatient Hospital Stay (HOSPITAL_COMMUNITY): Payer: Medicaid Other

## 2019-06-06 LAB — GLUCOSE, CAPILLARY
Glucose-Capillary: 102 mg/dL — ABNORMAL HIGH (ref 70–99)
Glucose-Capillary: 102 mg/dL — ABNORMAL HIGH (ref 70–99)
Glucose-Capillary: 85 mg/dL (ref 70–99)
Glucose-Capillary: 118 mg/dL — ABNORMAL HIGH (ref 70–99)
Glucose-Capillary: 97 mg/dL (ref 70–99)

## 2019-06-06 LAB — PROTIME-INR
INR: 3.2 — ABNORMAL HIGH (ref 0.8–1.2)
Prothrombin Time: 32.7 seconds — ABNORMAL HIGH (ref 11.4–15.2)

## 2019-06-06 MED ORDER — WARFARIN SODIUM 7.5 MG PO TABS
7.5000 mg | ORAL_TABLET | Freq: Once | ORAL | Status: AC
  Administered 2019-06-06: 7.5 mg via ORAL
  Filled 2019-06-06: qty 1

## 2019-06-06 NOTE — Progress Notes (Signed)
[place] PHYSICAL MEDICINE & REHABILITATION PROGRESS NOTE  Subjective/Complaints: Patient states he needs help using the urinal.  Denies other complaints. No pain or constipation.   ROS: Denies CP, SOB, N/V/D  Objective: Vital Signs: Blood pressure 112/74, pulse 65, temperature 97.7 F (36.5 C), temperature source Oral, resp. rate 16, height 5\' 11"  (1.803 m), weight 101.4 kg, SpO2 97 %. No results found. No results for input(s): WBC, HGB, HCT, PLT in the last 72 hours. Recent Labs    06/05/19 0553  NA 140  K 3.7  CL 103  CO2 26  GLUCOSE 119*  BUN 14  CREATININE 1.00  CALCIUM 9.1    Physical Exam: BP 112/74 (BP Location: Left Arm)   Pulse 65   Temp 97.7 F (36.5 C) (Oral) Comment: awake to void  Resp 16   Ht 5\' 11"  (1.803 m)   Wt 101.4 kg   SpO2 97%   BMI 31.18 kg/m  Constitutional: No distress . Vital signs reviewed. Lying in bed attempting to use urinal.  HENT: Normocephalic.  Atraumatic.  + NG. Eyes: EOMI. No discharge. Cardiovascular: No JVD. Respiratory: Normal effort.  No stridor. GI: Non-distended. Skin: Warm and dry.  Intact. Psych: Normal mood.  Normal behavior. Musc: No edema in extremities.  No tenderness in extremities. Neuro: Alert Severe dysarthria, unchanged Able to follow simple motor commands.  Motor:  RUE: 4-4+/5 proximal distal, stable RLE: Hip flexion, knee extension 4 +/5, dorsiflexion 4+/5, stable  Assessment/Plan: 1. 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  Physiatrist is providing close team supervision and 24 hour management of active medical problems listed below.  Physiatrist and rehab team continue to assess barriers to discharge/monitor patient progress toward functional and medical goals  Care Tool:  Bathing    Body parts bathed by patient: Right arm, Left lower leg, Left arm, Chest,  Face, Abdomen, Front perineal area, Right upper leg, Left upper leg, Right lower leg   Body parts bathed by helper: Buttocks     Bathing assist Assist Level: Minimal Assistance - Patient > 75%     Upper Body Dressing/Undressing Upper body dressing   What is the patient wearing?: Pull over shirt    Upper body assist Assist Level: Supervision/Verbal cueing    Lower Body Dressing/Undressing Lower body dressing      What is the patient wearing?: Underwear/pull up, Pants     Lower body assist Assist for lower body dressing: Minimal Assistance - Patient > 75%     Toileting Toileting    Toileting assist Assist for toileting: Contact Guard/Touching assist     Transfers Chair/bed transfer  Transfers assist     Chair/bed transfer assist level: Contact Guard/Touching assist     Locomotion Ambulation   Ambulation assist   Ambulation activity did not occur: Safety/medical concerns(fatigue, LE weakness, restricted to threapy in room due to IV fluids)  Assist level: Contact Guard/Touching assist Assistive device: Walker-rolling Max distance: 2ft   Walk 10 feet activity   Assist  Walk 10 feet activity did not occur: Safety/medical concerns(fatigue, LE weakness, restricted to threapy in room due to IV fluids)  Assist level: Contact Guard/Touching assist Assistive device: Walker-rolling   Walk 50 feet activity   Assist Walk 50 feet with 2 turns activity did not occur: Safety/medical concerns(fatigue, LE weakness, restricted to threapy in room due to IV fluids)  Assist level: Contact Guard/Touching assist Assistive  device: Walker-rolling    Walk 150 feet activity   Assist Walk 150 feet activity did not occur: Safety/medical concerns(fatigue, LE weakness, restricted to threapy in room due to IV fluids)  Assist level: Minimal Assistance - Patient > 75% Assistive device: Walker-rolling    Walk 10 feet on uneven surface  activity   Assist Walk 10 feet on  uneven surfaces activity did not occur: Safety/medical concerns(fatigue, LE weakness, restricted to threapy in room due to IV fluids)         Wheelchair     Assist Will patient use wheelchair at discharge?: Yes Type of Wheelchair: Manual Wheelchair activity did not occur: Safety/medical concerns(fatigue, R hemi, restricted to threapy in room due to IV fluids)  Wheelchair assist level: Supervision/Verbal cueing Max wheelchair distance: 148ft    Wheelchair 50 feet with 2 turns activity    Assist    Wheelchair 50 feet with 2 turns activity did not occur: Safety/medical concerns(fatigue, R hemi, restricted to threapy in room due to IV fluids)   Assist Level: Supervision/Verbal cueing   Wheelchair 150 feet activity     Assist Wheelchair 150 feet activity did not occur: Safety/medical concerns(fatigue, R hemi, restricted to threapy in room due to IV fluids)   Assist Level: Supervision/Verbal cueing    Medical Problem List and Plan: 1.  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  Continue CIR 2.  Antithrombotics: -DVT/anticoagulation:  Pharmaceutical: Coumadin goal 2.5-3.5.   INR therapeutic 1/29, 1/30. Appreciate pharmacy consult. Reviewed note. Warfarin 7.5mg  tonight.   -antiplatelet therapy: on ASA 3. Pain Management: tylenol prn 4. Mood: LCSW to follow for evaluation and support when appropriate.              -antipsychotic agents: N/A 5. Neuropsych: This patient is not fully capable of making decisions on his own behalf. 6. Skin/Wound Care: Routine pressure relief measures.  7. Fluids/Electrolytes/Nutrition: Monitor electrolytes 8. Aspiration PNA: Completed 7 day course of Unasyn on 1/12. Needs to be upright with pulmonary toilet.    Weaned supplemental oxygen as tolerated 9. CAF/CHB s/p PPM:   Monitor HR Off amiodarone and Entresto due to  issues with hypotension.   Controlled on 1/29, 1/30 11 COPD/OSA: Duonebs and incruse resumed. Encourage IS and keep HOB>30 degrees.   Communicated medication adjustments with respiratory therapy 12. Depression/anxiety disorder: Emotional support 13. H/o migraines: Has been managed on Topamax and gabapentin.  14. Hypokalemia: Resolved  Kdur DC'd on 1/11  Potassium 3.7 on 1/29  Continue to monitor labs 15.  Hyperglycemia-secondary to tube feeds  Relatively controlled on 1/29, 1/30  Continue SSI as needed 16.  Leukocytosis: Resolved  WBCs 9.0 on 1/13  Afebrile  Continue to monitor 17.  Acute blood loss anemia  Hemoglobin 12.0 on 1/25  Continue to monitor 18.  Hypoalbuminemia  Adjust with tube feeds 19.  Acute on chronic combined congestive heart failure  EF 25-30%  Daily weights  No sign of fluid overload at this time, continue to monitor [facility] Weights   06/03/19 0500 06/04/19 0351 06/05/19 0459  Weight: 101.5 kg 101.5 kg 101.4 kg   Lasix 20 daily started on 1/19, increased on 1/20, increased to 40 twice daily  Free water flushes decreased on 1/22---hold on 1/23  -pt is on 40mg  am and 20mg  pm lasix at home  Stable on 1/29, 1/30 20.  Loose stools  Discussed changing tube feeds with dietary  KUB reviewed, unremarkable  Improved after completion of abx 21.  Post stroke dysphagia  Eating variable, but overall moderate  Advanced D1 pudding, advanced to D2 nectar  Liquid supplement DC'd, DC NG on 1/29   Will monitor intake through weekend- labs ordered for Monday  Continue to advance diet as tolerated      LOS: 24 days A FACE TO FACE EVALUATION WAS PERFORMED  Drema Pry Cherish Runde 06/06/2019, 2:59 PM

## 2019-06-06 NOTE — Plan of Care (Signed)
  Problem: Consults Goal: RH STROKE PATIENT EDUCATION Description: See Patient Education module for education specifics  Outcome: Progressing Goal: Nutrition Consult-if indicated Outcome: Progressing Goal: Diabetes Guidelines if Diabetic/Glucose > 140 Description: If diabetic or lab glucose is > 140 mg/dl - Initiate Diabetes/Hyperglycemia Guidelines & Document Interventions  Outcome: Progressing   Problem: RH BOWEL ELIMINATION Goal: RH STG MANAGE BOWEL WITH ASSISTANCE Description: STG Manage Bowel with mod Assistance. Outcome: Progressing Goal: RH STG MANAGE BOWEL W/MEDICATION W/ASSISTANCE Description: STG Manage Bowel with Medication with min Assistance. Outcome: Progressing   Problem: RH BLADDER ELIMINATION Goal: RH STG MANAGE BLADDER WITH ASSISTANCE Description: STG Manage Bladder With mod Assistance Outcome: Progressing Goal: RH STG MANAGE BLADDER WITH MEDICATION WITH ASSISTANCE Description: STG Manage Bladder With Medication With min Assistance. Outcome: Progressing   Problem: RH SKIN INTEGRITY Goal: RH STG SKIN FREE OF INFECTION/BREAKDOWN Description: Skin to remain free from infection and breakdown while rehab with min assist. Outcome: Progressing Goal: RH STG ABLE TO PERFORM INCISION/WOUND CARE W/ASSISTANCE Description: STG Able To Perform Incision/Wound Care With min Assistance. Outcome: Progressing   Problem: RH SAFETY Goal: RH STG ADHERE TO SAFETY PRECAUTIONS W/ASSISTANCE/DEVICE Description: STG Adhere to Safety Precautions With mod Assistance and appropriate assistive Device. Outcome: Progressing   Problem: RH PAIN MANAGEMENT Goal: RH STG PAIN MANAGED AT OR BELOW PT'S PAIN GOAL Description: <3 on a 0-10 pain scale Outcome: Progressing   Problem: RH KNOWLEDGE DEFICIT Goal: RH STG INCREASE KNOWLEDGE OF DIABETES Description: Patient and caregiver will increase knowledge on diabetic medications, dietary restrictions, and follow-up care with the MD post discharge  with min assist from staff. Outcome: Progressing Goal: RH STG INCREASE KNOWLEDGE OF HYPERTENSION Description: Patient and caregiver will increase knowledge on HTN medications, dietary restrictions, and follow-up care with the MD post discharge with min assist from staff.  Outcome: Progressing Goal: RH STG INCREASE KNOWLEDGE OF DYSPHAGIA/FLUID INTAKE Description: Patient and caregiver will increase knowledge of safe swallowing techniques to prevent aspiration pneumonia; increase knowledge of dysphagia diets, and thickened liquids with min assist from staff.  Outcome: Progressing Goal: RH STG INCREASE KNOWLEGDE OF HYPERLIPIDEMIA Description: Patient and caregiver will increase knowledge on HLD medications, dietary restrictions, and follow-up care with the MD post discharge with min assist from staff.  Outcome: Progressing Goal: RH STG INCREASE KNOWLEDGE OF STROKE PROPHYLAXIS Description: Patient and caregiver will increase knowledge on Stroke prevention medications, dietary restrictions, and follow-up care with the MD post discharge with min assist from staff.  Outcome: Progressing   Problem: RH Vision Goal: RH LTG Vision (Specify) Outcome: Progressing   

## 2019-06-06 NOTE — Progress Notes (Signed)
Physical Therapy Session Note  Patient Details  Name: Jon [name] MRN: [id number] Date of Birth: [birth year]  Today's Date: 06/06/2019 PT Individual Time: 0915-1000 PT Individual Time Calculation (min): 45 min   Short Term Goals: Week 3:  PT Short Term Goal 1 (Week 3): Pt will perform stand<>pivot with LRAD CGA PT Short Term Goal 1 - Progress (Week 3): Progressing toward goal PT Short Term Goal 2 (Week 3): Pt will perform car transfer with LRAD min A PT Short Term Goal 2 - Progress (Week 3): Met PT Short Term Goal 3 (Week 3): Pt will ambulate 17f with LRAD min A PT Short Term Goal 3 - Progress (Week 3): Met Week 4:  PT Short Term Goal 1 (Week 4): STG=LTG due to LOS  Skilled Therapeutic Interventions/Progress Updates:   Received pt supine in bed, RN present administering medication, pt agreeable to therapy, and denied any pain throughout session. Session focused on functional mobility/transfers, dressing, toileting, ambulation with RW, LE strength, dynamic standing balance/coordination, and improved activity tolerance. Pt doffed brief in supine with supervision and donned underwear in supine with supervision. Pt performed bed mobility with supervision and donned pants sitting EOB with min A for threading LE's through pants. Pt transferred sit<>stand with RW and pulled pants over hips with CGA. Pt ambulated 159fwith RW min A to bathroom and performed toilet transfer CGA. Pt continent of bowel and able to perform hygiene with supervision however required min A to pull underwear and pants over hips in standing. Pt ambulated 7f59fith RW min A to sink and washed hands with supervision. Pt declined ambulating to gym today and stated that he felt short of breath. O2 sat 94% on RA. Therapist educated pt on importance of frequent rest breaks and breathing techniques; however pt with poor carry over. Pt performed WC mobility 150f60fth bilateral LEs and supervision to dayroom. O2 sat 96% after  activity, however pt reported feeling fatigued. Pt transferred stand<>pivot WC<> Nustep CGA without AD. Pt performed bilateral UE and LE strengthening on Nustep for 3 minutes at workload 6 for a total of 154 steps. Pt reported fatigue at end of session and requested to return to bed. [facility] Pt transferred WC<>bed stand<>pivot without AD CGA and sit<>supine with supervision. Concluded session with pt supine in bed, needs within reach, and bed alarm on.   Therapy Documentation Precautions:  Precautions Precautions: Fall Precaution Comments: R hemi, dysarthria, dysphagia, on .5L O2 via [place], NG tube, and NPO Restrictions Weight Bearing Restrictions: No   Therapy/Group: Individual Therapy AnnaAlfonse Alpers DPT   06/06/2019, 7:45 AM

## 2019-06-06 NOTE — Progress Notes (Signed)
ANTICOAGULATION CONSULT NOTE  Pharmacy Consult for Warfarin Indication: stroke and mechanical valve, atrial fibrillation  Allergies  Allergen Reactions  . Hydrocodone-Acetaminophen Other (See Comments)    PT MOTHER DOES NOT REMEMBER REACTION  Only use in Emergency   . Hydromorphone Itching and Rash       . Other Other (See Comments)    ALL NARCOTICS-ITCHING, RASH   . Oxycodone Other (See Comments)    Unknown reaction  . Penicillins Rash    Did it involve swelling of the face/tongue/throat, SOB, or low BP? Unknown Did it involve sudden or severe rash/hives, skin peeling, or any reaction on the inside of your mouth or nose? Unknown Did you need to seek medical attention at a hospital or doctor's office? Unknown When did it last happen?unknown If all above answers are "NO", may proceed with cephalosporin use.  . Dilaudid [Hydromorphone Hcl] Itching and Rash    Patient Measurements: Total Body Weight: 97.2 kg Height: 71 inches  Vital Signs: Temp: 97.7 F (36.5 C) (01/30 0413) Temp Source: Oral (01/30 0413) BP: 112/74 (01/30 0413) Pulse Rate: 65 (01/30 0413)  Labs: Recent Labs    06/04/19 0531 06/05/19 0553 06/06/19 0709  LABPROT 29.4* 29.8* 32.7*  INR 2.8* 2.8* 3.2*  CREATININE  --  1.00  --     Estimated Creatinine Clearance: 103.6 mL/min (by C-G formula based on SCr of 1 mg/dL).   Assessment: 54 yr old male presented to the ED with stroke symptoms after recent discharge from [facility]. He is supposed to be on warfarin and Lovenox for history of a mechanical valve.  Patient is s/p thrombectomy and s/p bridging with Heparin to therapeutic INR with warfarin. Pharmacy consulted to dose.   INR continues to be therapeutic at 3.2 this AM, previous CBCs have been stable.    Goal of Therapy:  INR 2.5-3.5  Monitor platelets by anticoagulation protocol: Yes   Plan:  Warfarin 7.5 mg po x 1 dose tonight Daily INR, s/s bleeding  Fabio Neighbors, PharmD PGY1  Ambulatory Care Resident [employer] # [phone]  Please utilize Amion for appropriate phone number to reach the unit pharmacist [facility])

## 2019-06-07 ENCOUNTER — Inpatient Hospital Stay (HOSPITAL_COMMUNITY): Payer: Medicaid Other | Admitting: Speech Pathology

## 2019-06-07 LAB — PROTIME-INR
INR: 2.9 — ABNORMAL HIGH (ref 0.8–1.2)
Prothrombin Time: 30.3 seconds — ABNORMAL HIGH (ref 11.4–15.2)

## 2019-06-07 LAB — GLUCOSE, CAPILLARY
Glucose-Capillary: 106 mg/dL — ABNORMAL HIGH (ref 70–99)
Glucose-Capillary: 74 mg/dL (ref 70–99)
Glucose-Capillary: 82 mg/dL (ref 70–99)
Glucose-Capillary: 96 mg/dL (ref 70–99)

## 2019-06-07 MED ORDER — WARFARIN SODIUM 7.5 MG PO TABS
7.5000 mg | ORAL_TABLET | Freq: Once | ORAL | Status: AC
  Administered 2019-06-07: 18:00:00 7.5 mg via ORAL
  Filled 2019-06-07: qty 1

## 2019-06-07 NOTE — Progress Notes (Signed)
[place] PHYSICAL MEDICINE & REHABILITATION PROGRESS NOTE  Subjective/Complaints: Sitting up in [facility] working with SLP Denny Peon. Speech improved.  Excited to be going home on Tuesday! Denies pain, constipation.   ROS: Denies CP, SOB, N/V/D  Objective: Vital Signs: Blood pressure 112/73, pulse 68, temperature 98.2 F (36.8 C), resp. rate 16, height 5\' 11"  (1.803 m), weight 101.4 kg, SpO2 98 %. No results found. No results for input(s): WBC, HGB, HCT, PLT in the last 72 hours. Recent Labs    06/05/19 0553  NA 140  K 3.7  CL 103  CO2 26  GLUCOSE 119*  BUN 14  CREATININE 1.00  CALCIUM 9.1    Physical Exam: BP 112/73   Pulse 68   Temp 98.2 F (36.8 C)   Resp 16   Ht 5\' 11"  (1.803 m)   Wt 101.4 kg   SpO2 98%   BMI 31.18 kg/m  Constitutional: No distress . Vital signs reviewed. Sitting up in [facility] working with SLP .  HENT: Normocephalic.  Atraumatic.  + NG. Eyes: EOMI. No discharge. Cardiovascular: No JVD. Respiratory: Normal effort.  No stridor. GI: Non-distended. Skin: Warm and dry.  Intact. Psych: Normal mood.  Normal behavior. Musc: No edema in extremities.  No tenderness in extremities. Neuro: Alert Severe dysarthria, speech continues to improve.  Able to follow simple motor commands.  Motor:  RUE: 4-4+/5 proximal distal, stable RLE: Hip flexion, knee extension 4 +/5, dorsiflexion 4+/5, stable  Assessment/Plan: 1. 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  Physiatrist is providing close team supervision and 24 hour management of active medical problems listed below.  Physiatrist and rehab team continue to assess barriers to discharge/monitor patient progress toward functional and medical goals  Care Tool:  Bathing    Body parts bathed by patient: Right arm, Left lower leg, Left arm, Chest, Face, Abdomen, Front perineal  area, Right upper leg, Left upper leg, Right lower leg   Body parts bathed by helper: Buttocks     Bathing assist Assist Level: Minimal Assistance - Patient > 75%     Upper Body Dressing/Undressing Upper body dressing   What is the patient wearing?: Pull over shirt    Upper body assist Assist Level: Supervision/Verbal cueing    Lower Body Dressing/Undressing Lower body dressing      What is the patient wearing?: Underwear/pull up, Pants     Lower body assist Assist for lower body dressing: Minimal Assistance - Patient > 75%     Toileting Toileting    Toileting assist Assist for toileting: Contact Guard/Touching assist     Transfers Chair/bed transfer  Transfers assist     Chair/bed transfer assist level: Contact Guard/Touching assist     Locomotion Ambulation   Ambulation assist   Ambulation activity did not occur: Safety/medical concerns(fatigue, LE weakness, restricted to threapy in room due to IV fluids)  Assist level: Contact Guard/Touching assist Assistive device: Walker-rolling Max distance: 50ft   Walk 10 feet activity   Assist  Walk 10 feet activity did not occur: Safety/medical concerns(fatigue, LE weakness, restricted to threapy in room due to IV fluids)  Assist level: Contact Guard/Touching assist Assistive device: Walker-rolling   Walk 50 feet activity   Assist Walk 50 feet with 2 turns activity did not occur: Safety/medical concerns(fatigue, LE weakness, restricted to threapy in room due to IV fluids)  Assist level: Contact Guard/Touching assist Assistive device:  Walker-rolling    Walk 150 feet activity   Assist Walk 150 feet activity did not occur: Safety/medical concerns(fatigue, LE weakness, restricted to threapy in room due to IV fluids)  Assist level: Minimal Assistance - Patient > 75% Assistive device: Walker-rolling    Walk 10 feet on uneven surface  activity   Assist Walk 10 feet on uneven surfaces activity did not  occur: Safety/medical concerns(fatigue, LE weakness, restricted to threapy in room due to IV fluids)         Wheelchair     Assist Will patient use wheelchair at discharge?: Yes Type of Wheelchair: Manual Wheelchair activity did not occur: Safety/medical concerns(fatigue, R hemi, restricted to threapy in room due to IV fluids)  Wheelchair assist level: Supervision/Verbal cueing Max wheelchair distance: 164ft    Wheelchair 50 feet with 2 turns activity    Assist    Wheelchair 50 feet with 2 turns activity did not occur: Safety/medical concerns(fatigue, R hemi, restricted to threapy in room due to IV fluids)   Assist Level: Supervision/Verbal cueing   Wheelchair 150 feet activity     Assist Wheelchair 150 feet activity did not occur: Safety/medical concerns(fatigue, R hemi, restricted to threapy in room due to IV fluids)   Assist Level: Supervision/Verbal cueing    Medical Problem List and Plan: 1.  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  Continue CIR 2.  Antithrombotics: -DVT/anticoagulation:  Pharmaceutical: Coumadin goal 2.5-3.5.   INR therapeutic 1/29, 1/30. Appreciate pharmacy consult. Reviewed note. Warfarin 7.5mg  tonight.   1/31: INR 3.2. Appreciate pharm consult. Therapeutic. Warfarin 7.5 tonight.   -antiplatelet therapy: on ASA 3. Pain Management: tylenol prn 4. Mood: LCSW to follow for evaluation and support when appropriate.              -antipsychotic agents: N/A 5. Neuropsych: This patient is not fully capable of making decisions on his own behalf. 6. Skin/Wound Care: Routine pressure relief measures.  7. Fluids/Electrolytes/Nutrition: Monitor electrolytes 8. Aspiration PNA: Completed 7 day course of Unasyn on 1/12. Needs to be upright with pulmonary toilet.    Weaned supplemental oxygen as tolerated 9. CAF/CHB s/p PPM:    Monitor HR Off amiodarone and Entresto due to issues with hypotension.   Controlled on 1/29, 1/30  1/31: flowsheet reviewed, HR stable.  11 COPD/OSA: Duonebs and incruse resumed. Encourage IS and keep HOB>30 degrees.   Communicated medication adjustments with respiratory therapy 12. Depression/anxiety disorder: Emotional support 13. H/o migraines: Has been managed on Topamax and gabapentin.  14. Hypokalemia: Resolved  Kdur DC'd on 1/11  Potassium 3.7 on 1/29  Continue to monitor labs 15.  Hyperglycemia-secondary to tube feeds  Relatively controlled on 1/29, 1/30, 1/31  Continue SSI as needed 16.  Leukocytosis: Resolved  WBCs 9.0 on 1/13  Afebrile  Continue to monitor 17.  Acute blood loss anemia  Hemoglobin 12.0 on 1/25  Continue to monitor 18.  Hypoalbuminemia  Adjust with tube feeds 19.  Acute on chronic combined congestive heart failure  EF 25-30%  Daily weights  No sign of fluid overload at this time, continue to monitor [facility] Weights   06/03/19 0500 06/04/19 0351 06/05/19 0459  Weight: 101.5 kg 101.5 kg 101.4 kg   Lasix 20 daily started on 1/19, increased on 1/20, increased to 40 twice daily  Free water flushes decreased on 1/22---hold on 1/23  -pt is on 40mg  am and 20mg  pm lasix at home  Stable on 1/29, 1/30, 1/31 20.  Loose stools  Discussed changing tube feeds with dietary  KUB reviewed, unremarkable  Improved after completion of abx 21.  Post stroke dysphagia  Eating variable, but overall moderate  Advanced D1 pudding, advanced to D2 nectar  Liquid supplement DC'd, DC NG on 1/29   Will monitor intake through weekend- labs ordered for Monday  Continue to advance diet as tolerated      LOS: 25 days A FACE TO FACE EVALUATION WAS [place] 06/07/2019, 11:41 AM

## 2019-06-07 NOTE — Progress Notes (Addendum)
ANTICOAGULATION CONSULT NOTE  Pharmacy Consult for Warfarin Indication: stroke and mechanical valve, atrial fibrillation  Allergies  Allergen Reactions  . Hydrocodone-Acetaminophen Other (See Comments)    PT MOTHER DOES NOT REMEMBER REACTION  Only use in Emergency   . Hydromorphone Itching and Rash       . Other Other (See Comments)    ALL NARCOTICS-ITCHING, RASH   . Oxycodone Other (See Comments)    Unknown reaction  . Penicillins Rash    Did it involve swelling of the face/tongue/throat, SOB, or low BP? Unknown Did it involve sudden or severe rash/hives, skin peeling, or any reaction on the inside of your mouth or nose? Unknown Did you need to seek medical attention at a hospital or doctor's office? Unknown When did it last happen?unknown If all above answers are "NO", may proceed with cephalosporin use.  . Dilaudid [Hydromorphone Hcl] Itching and Rash    Patient Measurements: Total Body Weight: 97.2 kg Height: 71 inches  Vital Signs: Temp: 98.2 F (36.8 C) (01/31 0601) BP: 112/73 (01/31 0601) Pulse Rate: 68 (01/31 0700)  Labs: Recent Labs    06/05/19 0553 06/06/19 0709 06/07/19 0716  LABPROT 29.8* 32.7* 30.3*  INR 2.8* 3.2* 2.9*  CREATININE 1.00  --   --     Estimated Creatinine Clearance: 103.6 mL/min (by C-G formula based on SCr of 1 mg/dL).   Assessment: 54 yr old male presented to the ED with stroke symptoms after recent discharge from [facility]. He is supposed to be on warfarin and Lovenox for history of a mechanical valve.  Patient is s/p thrombectomy and s/p bridging with Heparin to therapeutic INR with warfarin. Pharmacy consulted to dose.   INR continues to be therapeutic at 2.9 this AM, previous CBCs have been stable (last CBC 1/25)    Goal of Therapy:  INR 2.5-3.5  Monitor platelets by anticoagulation protocol: Yes   Plan:  Warfarin 7.5 mg po x 1 dose tonight Daily INR, s/s bleeding CBC Q-Mon  Fabio Neighbors, PharmD PGY1  Ambulatory Care Resident [employer] # [phone]  Please utilize Amion for appropriate phone number to reach the unit pharmacist [facility])

## 2019-06-07 NOTE — Progress Notes (Signed)
ANTICOAGULATION CONSULT NOTE  Pharmacy Consult for Warfarin Indication: stroke and mechanical valve, atrial fibrillation  Allergies  Allergen Reactions  . Hydrocodone-Acetaminophen Other (See Comments)    PT MOTHER DOES NOT REMEMBER REACTION  Only use in Emergency   . Hydromorphone Itching and Rash       . Other Other (See Comments)    ALL NARCOTICS-ITCHING, RASH   . Oxycodone Other (See Comments)    Unknown reaction  . Penicillins Rash    Did it involve swelling of the face/tongue/throat, SOB, or low BP? Unknown Did it involve sudden or severe rash/hives, skin peeling, or any reaction on the inside of your mouth or nose? Unknown Did you need to seek medical attention at a hospital or doctor's office? Unknown When did it last happen?unknown If all above answers are "NO", may proceed with cephalosporin use.  . Dilaudid [Hydromorphone Hcl] Itching and Rash    Patient Measurements: Total Body Weight: 97.2 kg Height: 71 inches  Vital Signs: Temp: 98.2 F (36.8 C) (01/31 0601) BP: 112/73 (01/31 0601) Pulse Rate: 68 (01/31 0700)  Labs: Recent Labs    06/05/19 0553 06/06/19 0709 06/07/19 0716  LABPROT 29.8* 32.7* 30.3*  INR 2.8* 3.2* 2.9*  CREATININE 1.00  --   --     Estimated Creatinine Clearance: 103.6 mL/min (by C-G formula based on SCr of 1 mg/dL).   Assessment: 54 yr old male presented to the ED with stroke symptoms after recent discharge from [facility]. He is supposed to be on warfarin and Lovenox for history of a mechanical valve.  Patient is s/p thrombectomy and s/p bridging with Heparin to therapeutic INR with warfarin. Pharmacy consulted to dose.   INR continues to be therapeutic at 3.2 this AM, previous CBCs have been stable.    Goal of Therapy:  INR 2.5-3.5  Monitor platelets by anticoagulation protocol: Yes   Plan:  Warfarin 7.5 mg po x 1 dose tonight Daily INR, s/s bleeding CBC Q-Mon  Fabio Neighbors, PharmD PGY1 Ambulatory Care  Resident [employer] # [phone]  Please utilize Amion for appropriate phone number to reach the unit pharmacist [facility])

## 2019-06-07 NOTE — Progress Notes (Signed)
Speech Language Pathology Daily Session Note  Patient Details  Name: Jon [name] MRN: [id number] Date of Birth: [birth year]  Today's Date: 06/07/2019 SLP Individual Time: 0816-0901 SLP Individual Time Calculation (min): 45 min  Short Term Goals: Week 4: SLP Short Term Goal 1 (Week 4): STG=LTG due to remaining LOS  Skilled Therapeutic Interventions: Pt was seen for skilled ST targeting dysphagia and speech goals. Upon arrival pt consuming medications with nectar thick water, however not adhering to recommended swallow strategies. SLP provided Max A verbal cues for implementation of chin tuck and extra swallow during medication consumption and education regarding importance of carryover of strategies even outside of meals (when taking meds). Pt able to verbally acknowledge the importance and his aspiration risk without use of strategies, however continued to demonstrate difficulty implementing them. During consumption of additional 4oz nectar thick cranberry juice, pt demonstrated improved ability to use chin tuck and extra dry swallows with decreased Mod A verbal cues. Pt verbalized frustration with being unable to get out of bed on his own, but able to identify fall risk as reason to seek assistance with Min A question cues. During a structured phrase level speech task, pt read menu items with ~85% intelligibility and excellent implementation of pacing and overarticulation ith Min A verbal cues. Pt also communicated at the phrase and short sentence level during informal speech tasks with Min A verbal cues for use of clear speech strategies, however intelligibility decreased to ~75% and he demonstrated greater difficulty implementing overarticulation during informal tasks (compared to structured). Pt safely transferred from wheelchair to bed (with RW) and left with bed alarm on and needs within reach. Continue per current plan of care.       Pain Pain Assessment Pain Scale: 0-10 Pain Score:  0-No pain  Therapy/Group: Individual Therapy  [name] 06/07/2019, 9:11 AM

## 2019-06-08 ENCOUNTER — Inpatient Hospital Stay (HOSPITAL_COMMUNITY): Payer: Medicaid Other | Admitting: Speech Pathology

## 2019-06-08 ENCOUNTER — Inpatient Hospital Stay (HOSPITAL_COMMUNITY): Payer: Medicaid Other | Admitting: Occupational Therapy

## 2019-06-08 ENCOUNTER — Inpatient Hospital Stay (HOSPITAL_COMMUNITY): Payer: Medicaid Other

## 2019-06-08 LAB — BASIC METABOLIC PANEL
Anion gap: 13 (ref 5–15)
BUN: 14 mg/dL (ref 6–20)
CO2: 27 mmol/L (ref 22–32)
Calcium: 9.5 mg/dL (ref 8.9–10.3)
Chloride: 100 mmol/L (ref 98–111)
Creatinine, Ser: 1.04 mg/dL (ref 0.61–1.24)
GFR calc Af Amer: >60 mL/min (ref >60)
GFR calc non Af Amer: >60 mL/min (ref >60)
Glucose, Bld: 101 mg/dL — ABNORMAL HIGH (ref 70–99)
Potassium: 3.5 mmol/L (ref 3.5–5.1)
Sodium: 140 mmol/L (ref 135–145)

## 2019-06-08 LAB — CBC
HCT: 39.4 % (ref 39.0–52.0)
Hemoglobin: 12.5 g/dL — ABNORMAL LOW (ref 13.0–17.0)
MCH: 28.6 pg (ref 26.0–34.0)
MCHC: 31.7 g/dL (ref 30.0–36.0)
MCV: 90.2 fL (ref 80.0–100.0)
Platelets: 308 10*3/uL (ref 150–400)
RBC: 4.37 MIL/uL (ref 4.22–5.81)
RDW: 16.9 % — ABNORMAL HIGH (ref 11.5–15.5)
WBC: 8.1 10*3/uL (ref 4.0–10.5)
nRBC: 0 % (ref 0.0–0.2)

## 2019-06-08 LAB — GLUCOSE, CAPILLARY
Glucose-Capillary: 104 mg/dL — ABNORMAL HIGH (ref 70–99)
Glucose-Capillary: 98 mg/dL (ref 70–99)
Glucose-Capillary: 83 mg/dL (ref 70–99)
Glucose-Capillary: 111 mg/dL — ABNORMAL HIGH (ref 70–99)

## 2019-06-08 LAB — PROTIME-INR
INR: 2.8 — ABNORMAL HIGH (ref 0.8–1.2)
Prothrombin Time: 29.1 seconds — ABNORMAL HIGH (ref 11.4–15.2)

## 2019-06-08 MED ORDER — WARFARIN SODIUM 7.5 MG PO TABS
7.5000 mg | ORAL_TABLET | Freq: Every day | ORAL | Status: DC
  Administered 2019-06-08: 7.5 mg via ORAL
  Filled 2019-06-08: qty 1

## 2019-06-08 MED ORDER — PRO-STAT SUGAR FREE PO LIQD
30.0000 mL | Freq: Three times a day (TID) | ORAL | Status: DC
  Administered 2019-06-08 – 2019-06-09 (×2): 30 mL
  Filled 2019-06-08 (×2): qty 30

## 2019-06-08 NOTE — Progress Notes (Signed)
[facility] PHYSICAL MEDICINE & REHABILITATION PROGRESS NOTE  Subjective/Complaints: Patient seen laying in bed this morning.  He states he slept well overnight.  He states he had a good weekend.  He states he is eating and drinking well.  ROS: Denies CP, SOB, N/V/D  Objective: Vital Signs: Blood pressure 111/76, pulse 64, temperature 98.5 F (36.9 C), resp. rate 18, height 5\' 11"  (1.803 m), weight 101.4 kg, SpO2 95 %. No results found. Recent Labs    06/08/19 0614  WBC 8.1  HGB 12.5*  HCT 39.4  PLT 308   Recent Labs    06/08/19 0614  NA 140  K 3.5  CL 100  CO2 27  GLUCOSE 101*  BUN 14  CREATININE 1.04  CALCIUM 9.5    Physical Exam: BP 111/76 (BP Location: Left Arm)   Pulse 64   Temp 98.5 F (36.9 C)   Resp 18   Ht 5\' 11"  (1.803 m)   Wt 101.4 kg   SpO2 95%   BMI 31.18 kg/m  Constitutional: No distress . Vital signs reviewed. HENT: Normocephalic.  Atraumatic. Eyes: EOMI. No discharge. Cardiovascular: No JVD. Respiratory: Normal effort.  No stridor. GI: Non-distended. Skin: Warm and dry.  Intact. Psych: Normal mood.  Normal behavior. Musc: No edema in extremities.  No tenderness in extremities. Neuro: Alert Severe dysarthria, slowly improving Able to follow simple motor commands.  Motor:  RUE: 4-4+/5 proximal distal, unchanged RLE: Hip flexion, knee extension 4 +/5, dorsiflexion 4+/5, stable  Assessment/Plan: 1. 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  Physiatrist is providing close team supervision and 24 hour management of active medical problems listed below.  Physiatrist and rehab team continue to assess barriers to discharge/monitor patient progress toward functional and medical goals  Care Tool:  Bathing    Body parts bathed by patient: Right arm, Left lower leg, Left arm, Chest, Face, Abdomen, Front perineal  area, Right upper leg, Left upper leg, Right lower leg   Body parts bathed by helper: Buttocks     Bathing assist Assist Level: Minimal Assistance - Patient > 75%     Upper Body Dressing/Undressing Upper body dressing   What is the patient wearing?: Pull over shirt    Upper body assist Assist Level: Supervision/Verbal cueing    Lower Body Dressing/Undressing Lower body dressing      What is the patient wearing?: Underwear/pull up, Pants     Lower body assist Assist for lower body dressing: Minimal Assistance - Patient > 75%     Toileting Toileting    Toileting assist Assist for toileting: Contact Guard/Touching assist     Transfers Chair/bed transfer  Transfers assist     Chair/bed transfer assist level: Contact Guard/Touching assist     Locomotion Ambulation   Ambulation assist   Ambulation activity did not occur: Safety/medical concerns(fatigue, LE weakness, restricted to threapy in room due to IV fluids)  Assist level: Contact Guard/Touching assist Assistive device: Walker-rolling Max distance: 79ft   Walk 10 feet activity   Assist  Walk 10 feet activity did not occur: Safety/medical concerns(fatigue, LE weakness, restricted to threapy in room due to IV fluids)  Assist level: Contact Guard/Touching assist Assistive device: Walker-rolling   Walk 50 feet activity   Assist Walk 50 feet with 2 turns activity did not occur: Safety/medical concerns(fatigue, LE weakness, restricted to threapy in room due to IV fluids)  Assist  level: Contact Guard/Touching assist Assistive device: Walker-rolling    Walk 150 feet activity   Assist Walk 150 feet activity did not occur: Safety/medical concerns(fatigue, LE weakness, restricted to threapy in room due to IV fluids)  Assist level: Minimal Assistance - Patient > 75% Assistive device: Walker-rolling    Walk 10 feet on uneven surface  activity   Assist Walk 10 feet on uneven surfaces activity did not  occur: Safety/medical concerns(fatigue, LE weakness, restricted to threapy in room due to IV fluids)         Wheelchair     Assist Will patient use wheelchair at discharge?: Yes Type of Wheelchair: Manual Wheelchair activity did not occur: Safety/medical concerns(fatigue, R hemi, restricted to threapy in room due to IV fluids)  Wheelchair assist level: Supervision/Verbal cueing Max wheelchair distance: 139ft    Wheelchair 50 feet with 2 turns activity    Assist    Wheelchair 50 feet with 2 turns activity did not occur: Safety/medical concerns(fatigue, R hemi, restricted to threapy in room due to IV fluids)   Assist Level: Supervision/Verbal cueing   Wheelchair 150 feet activity     Assist Wheelchair 150 feet activity did not occur: Safety/medical concerns(fatigue, R hemi, restricted to threapy in room due to IV fluids)   Assist Level: Supervision/Verbal cueing    Medical Problem List and Plan: 1.  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  Continue CIR 2.  Antithrombotics: -DVT/anticoagulation:  Pharmaceutical: Coumadin goal 2.5-3.5.   INR therapeutic 2/1  -antiplatelet therapy: on ASA 3. Pain Management: tylenol prn 4. Mood: LCSW to follow for evaluation and support when appropriate.              -antipsychotic agents: N/A 5. Neuropsych: This patient is not fully capable of making decisions on his own behalf. 6. Skin/Wound Care: Routine pressure relief measures.  7. Fluids/Electrolytes/Nutrition: Monitor electrolytes 8. Aspiration PNA: Completed 7 day course of Unasyn on 1/12. Needs to be upright with pulmonary toilet.    Weaned supplemental oxygen as tolerated 9. CAF/CHB s/p PPM:   Monitor HR Off amiodarone and Entresto due to issues with hypotension.   Controlled on 2/1 11 COPD/OSA: Duonebs and incruse resumed. Encourage IS and keep  HOB>30 degrees.   Communicated medication adjustments with respiratory therapy 12. Depression/anxiety disorder: Emotional support 13. H/o migraines: Has been managed on Topamax and gabapentin.  14. Hypokalemia: Resolved  Kdur DC'd on 1/11  Potassium 3.5 on 2/1  Continue to monitor labs 15.  Hyperglycemia-secondary to tube feeds-now off tube feeds  Relatively controlled on 2/1  Continue SSI as needed 16.  Leukocytosis: Resolved  WBCs 9.0 on 1/13  Afebrile  Continue to monitor 17.  Acute blood loss anemia  Hemoglobin 12.5 on 2/1  Continue to monitor 18.  Hypoalbuminemia  Adjust with tube feeds 19.  Acute on chronic combined congestive heart failure  EF 25-30%  Daily weights  No sign of fluid overload at this time, continue to monitor [facility] Weights   06/03/19 0500 06/04/19 0351 06/05/19 0459  Weight: 101.5 kg 101.5 kg 101.4 kg   Lasix 20 daily started on 1/19, increased on 1/20, increased to 40 twice daily  Free water flushes decreased on 1/22---hold on 1/23  -pt is on 40mg  am and 20mg  pm lasix at home  Stable on 2/1 20.  Loose stools  Discussed changing tube feeds with dietary  KUB reviewed, unremarkable  Improved after completion of  abx 21.  Post stroke dysphagia  Eating variable, but overall moderate on 2/1  Advanced D1 pudding, advanced to D2 nectar  Liquid supplement DC'd, DC NG on 1/29   Appears to be maintaining oral hydration/nutrition     LOS: 26 days A FACE TO FACE EVALUATION WAS PERFORMED  Georgean Spainhower Karis Juba 06/08/2019, 8:18 AM

## 2019-06-08 NOTE — Progress Notes (Signed)
Occupational Therapy Discharge Summary  Patient Details  Name: Jon [name] MRN: [id number] Date of Birth: [birth year]   Patient has met 78 of 11 long term goals due to improved activity tolerance, improved balance, postural control, ability to compensate for deficits, functional use of  RIGHT upper and RIGHT lower extremity, improved attention, improved awareness and improved coordination.  Patient to discharge at overall Contact guard level.  Patient's care partner is independent to provide the necessary physical and cognitive assistance at discharge.  Patient is to d/c to brother's house where his sister in law and mother will be providing the recommended assistance at contact guard.  Pt's sister in law has demonstrated safety and understanding with mobility, self-care tasks, and how to cue pt for increased safety.  Reasons goals not met: N/A  Recommendation:  Patient will benefit from ongoing skilled OT services in outpatient setting to continue to advance functional skills in the area of BADL and Reduce care partner burden.  Equipment: tub transfer bench  Reasons for discharge: treatment goals met and discharge from hospital  Patient/family agrees with progress made and goals achieved: Yes  OT Discharge Precautions/Restrictions  Precautions Precautions: Fall Precaution Comments: R hemi, dysarthria, dysphagia, impulsive General   Vital Signs Therapy Vitals Temp: 97.9 F (36.6 C) Pulse Rate: 62 Resp: 16 BP: 118/74 Patient Position (if appropriate): Lying Oxygen Therapy SpO2: 98 % O2 Device: Room Air Pain Pain Assessment Pain Scale: 0-10 Pain Score: 0-No pain ADL ADL Eating: Supervision/safety, Minimal cueing Where Assessed-Eating: Chair Grooming: Supervision/safety Where Assessed-Grooming: Sitting at sink Upper Body Bathing: Supervision/safety Where Assessed-Upper Body Bathing: Shower Lower Body Bathing: Contact guard Where Assessed-Lower Body Bathing:  Shower Upper Body Dressing: Supervision/safety Where Assessed-Upper Body Dressing: Edge of bed Lower Body Dressing: Contact guard Where Assessed-Lower Body Dressing: Edge of bed Toileting: Supervision/safety Where Assessed-Toileting: Glass blower/designer: Close supervision, Therapist, music Method: Counselling psychologist: Grab bars(RW) Tub/Shower Transfer: Metallurgist Method: Optometrist: Facilities manager: Close supervision Social research officer, government Method: Heritage manager: Radio broadcast assistant, Grab bars Vision Baseline Vision/History: (mother reports deficits from other ailments and previous CVA) Patient Visual Report: Other (comment) Vision Assessment?: Yes Eye Alignment: Impaired (comment) Ocular Range of Motion: Within Functional Limits Tracking/Visual Pursuits: Able to track stimulus in all quads without difficulty Saccades: Decreased speed of saccadic movement Additional Comments: occasional head movements to locate objects in ADLs; does close Lt eye occasionally during self-care tasks Perception  Perception: Within Functional Limits Praxis Praxis: Impaired Praxis Impairment Details: Motor planning Cognition Overall Cognitive Status: History of cognitive impairments - at baseline Arousal/Alertness: Awake/alert Orientation Level: Oriented X4 Attention: Sustained Sustained Attention: Impaired Sustained Attention Impairment: Verbal basic;Functional basic Awareness: Impaired Awareness Impairment: Emergent impairment Problem Solving: Impaired Problem Solving Impairment: Functional basic;Verbal basic Behaviors: Restless;Impulsive Safety/Judgment: Impaired Comments: Very impulsive with mobility and self-care tasks Sensation Sensation Light Touch: Impaired by gross assessment Light Touch Impaired Details: Impaired RUE Proprioception: Impaired by gross  assessment Proprioception Impaired Details: Impaired RUE;Impaired RLE Coordination Gross Motor Movements are Fluid and Coordinated: No Fine Motor Movements are Fluid and Coordinated: No Coordination and Movement Description: Grossly uncoordinated due to R hemi, LE weakness, and decreased postural control Finger Nose Finger Test: dysmetria bilaterally, overshoots with LUE Heel Shin Test: decreased bilaterally Mobility  Bed Mobility Bed Mobility: Rolling Right;Rolling Left;Supine to Sit;Sit to Supine Rolling Right: Independent Rolling Left: Independent Supine to Sit: Independent Sit to Supine: Independent Transfers Sit to Stand:  Contact Guard/Touching assist Stand to Sit: Contact Guard/Touching assist   Balance Balance Balance Assessed: Yes Standardized Balance Assessment Standardized Balance Assessment: Timed Up and Go Test Timed Up and Go Test TUG: Normal TUG Normal TUG (seconds): 26 Static Sitting Balance Static Sitting - Balance Support: No upper extremity supported Static Sitting - Level of Assistance: 7: Independent Dynamic Sitting Balance Dynamic Sitting - Balance Support: No upper extremity supported Dynamic Sitting - Level of Assistance: 7: Independent Static Standing Balance Static Standing - Balance Support: Bilateral upper extremity supported Static Standing - Level of Assistance: 5: Stand by assistance Dynamic Standing Balance Dynamic Standing - Balance Support: Bilateral upper extremity supported Dynamic Standing - Level of Assistance: 5: Stand by assistance Extremity/Trunk Assessment RUE Assessment RUE Assessment: Exceptions to [facility] General Strength Comments: strength grossly 4/5 RUE Body System: Neuro Brunstrum levels for arm and hand: Arm;Hand Brunstrum level for arm: Stage V Relative Independence from Synergy Brunstrum level for hand: Stage IV Movements deviating from synergies RUE Tone RUE Tone: Within Functional Limits LUE Assessment LUE Assessment:  Within Functional Limits   Alysia Scism 06/08/2019, 3:16 PM

## 2019-06-08 NOTE — Progress Notes (Signed)
Physical Therapy Discharge Summary  Patient Details  Name: Jon [name] MRN: [id number] Date of Birth: [birth year]  Today's Date: 06/08/2019 PT Individual Time: 2633-3545 PT Individual Time Calculation (min): 48 min    Patient has met 11 of 11 long term goals due to improved activity tolerance, improved balance, improved postural control, increased strength, improved awareness and improved coordination. Patient to discharge at a wheelchair level CGA.  Patient's care partner is independent to provide the necessary physical assistance at discharge. Family (mother and sister-in-law) attended family education training and verbalized/demonstrated confidence with tasks required for discharge home. Therapist educated family/patient heavily on pt's decreased safety awareness and impulsivity and demonstrated methods and strategies to ensure patient safety; family verbalized understanding.   All goals met   Recommendation:  Patient will benefit from ongoing skilled PT services in outpatient setting to continue to advance safe functional mobility, address ongoing impairments in ambulation, LE strength, dynamic standing balance/coordination, motor control, endurance, and to minimize fall risk.  Equipment: RW and manual WC  Reasons for discharge: treatment goals met  Patient/family agrees with progress made and goals achieved: Yes  Today's Interventions: Received pt sitting in WC, pt agreeable to therapy, and denied any pain throughout session. Session focused on discharge planning, functional mobility/transfers, simulated car transfer, ambulation with RW, stair navigation, LE strength, dynamic standing balance, and improved endurance with activity. Pt performed WC mobility 46f with bilateral LE and L UE independently. Pt refused to propel WC any further due to fatigue; however has been able to easily propel self 1547findependently in previous sessions. Pt performed ambulatory simulated car  transfer with RW CGA. Pt ambulated 1080fn uneven surfaces CGA with CGA. Pt required cues for RW safety and wider BOS. Pt navigated 12 steps with 2 rails CGA ascending and descending with a step to pattern. Pt required verbal cues for foot placement on step. Pt performed TUG with RW CGA/min A with turns with average of 26 seconds. Trial 1: 29 seconds, Trial 2: 23 seconds. Pt was educated on results of test and increased fall risk; however pt not paying attention to therapist and distracted by other people in gym. Pt ambulated [id number] RW CGA. Pt required verbal cues for increased R LE step length, RW safety, and wider BOS with increased fatigue. After ambulating pt refused to do any additional activities. Pt transported back to [facility]. Concluded session with pt sitting in WC, needs within reach, and seatbelt alarm on. Therapist provided pt with thickened orange juice and water.   PT Discharge Precautions/Restrictions Precautions Precautions: Fall Precaution Comments: R hemi, dysarthria, dysphagia, impulsive Restrictions Weight Bearing Restrictions: No Cognition Overall Cognitive Status: History of cognitive impairments - at baseline Arousal/Alertness: Awake/alert Orientation Level: Oriented X4 Awareness: Impaired Problem Solving: Impaired Behaviors: Restless;Impulsive Safety/Judgment: Impaired Comments: Very impulsive with mobility Sensation Sensation Light Touch: Appears Intact Proprioception: Impaired by gross assessment Coordination Gross Motor Movements are Fluid and Coordinated: No Fine Motor Movements are Fluid and Coordinated: No Coordination and Movement Description: Grossly uncoordinated due to R hemi, LE weakness, and decreased postural control Finger Nose Finger Test: dysmetria bilaterally Heel Shin Test: decreased bilaterally Motor  Motor Motor: Hemiplegia;Abnormal postural alignment and control Motor - Skilled Clinical Observations: Grossly uncoordinated  due to LE weakness, R hemi, and decreased postural control  Mobility Bed Mobility Bed Mobility: Rolling Right;Rolling Left;Supine to Sit;Sit to Supine Rolling Right: Independent Rolling Left: Independent Supine to Sit: Independent Sit to Supine: Independent Transfers Transfers: Sit to Stand;Stand  to Sit;Stand Pivot Transfers Sit to Stand: Contact Guard/Touching assist Stand to Sit: Contact Guard/Touching assist Stand Pivot Transfers: Contact Guard/Touching assist Stand Pivot Transfer Details: Verbal cues for technique;Verbal cues for precautions/safety;Verbal cues for sequencing;Verbal cues for safe use of DME/AE Stand Pivot Transfer Details (indicate cue type and reason): Pt requires verbal cues for RW safety, technique, and foot placement Transfer (Assistive device): Rolling walker Locomotion  Gait Ambulation: Yes Gait Assistance: Contact Guard/Touching assist Gait Distance (Feet): 150 Feet Assistive device: Rolling walker Gait Gait: Yes Gait Pattern: Impaired Gait Pattern: Decreased step length - right;Narrow base of support;Decreased stride length;Poor foot clearance - right;Trunk flexed;Decreased trunk rotation;Shuffle Gait velocity: decreased Stairs / Additional Locomotion Stairs: Yes Stairs Assistance: Contact Guard/Touching assist Stair Management Technique: Two rails Number of Stairs: 12 Height of Stairs: 6 Ramp: Contact Guard/touching assist(RW) Wheelchair Mobility Wheelchair Mobility: Yes Wheelchair Assistance: Independent with Camera operator: Both lower extermities Wheelchair Parts Management: Independent Distance: 122f  Trunk/Postural Assessment  Cervical Assessment Cervical Assessment: Within Functional Limits Thoracic Assessment Thoracic Assessment: Within Functional Limits Lumbar Assessment Lumbar Assessment: Exceptions to WFL(posterior pelvic tilt) Postural Control Postural Control: Deficits on evaluation   Balance Balance Balance Assessed: Yes Standardized Balance Assessment Standardized Balance Assessment: Timed Up and Go Test Timed Up and Go Test TUG: Normal TUG Normal TUG (seconds): 26 Static Sitting Balance Static Sitting - Balance Support: No upper extremity supported Static Sitting - Level of Assistance: 7: Independent Dynamic Sitting Balance Dynamic Sitting - Balance Support: No upper extremity supported Dynamic Sitting - Level of Assistance: 7: Independent Static Standing Balance Static Standing - Balance Support: Bilateral upper extremity supported Static Standing - Level of Assistance: 5: Stand by assistance Dynamic Standing Balance Dynamic Standing - Balance Support: Bilateral upper extremity supported Dynamic Standing - Level of Assistance: 5: Stand by assistance Extremity Assessment  RLE Assessment RLE Assessment: Exceptions to [facility] Strength Comments: grossly generalized to 4/5 LLE Assessment LLE Assessment: Exceptions to W[facility]intestinal EndocsopyGeneral Strength Comments: grossly generalized to 4/5    AAlfonse AlpersPT, DPT  06/08/2019, 7:49 AM

## 2019-06-08 NOTE — Progress Notes (Signed)
Nutrition Follow-up  DOCUMENTATION CODES:   Obesity unspecified  INTERVENTION:   81mL Pro-stat TID, provides 100 kcal 15g protein per serving.  Magic cup TID with meals (orange flavor only), each supplement provides 290 kcal and 9 grams of protein    NUTRITION DIAGNOSIS:   Inadequate oral intake related to dysphagia as evidenced by NPO status.  Ongoing.  GOAL:   Patient will meet greater than or equal to 90% of their needs  Progressing.   MONITOR:   PO intake, Diet advancement, Labs, Weight trends, TF tolerance  REASON FOR ASSESSMENT:   Consult Enteral/tube feeding initiation and management  ASSESSMENT:   54 year old male with PMH of CAF, CHB-s/p PPM, migraines, aortic dissection s/p AVR complicated by CVA with residual dysarthria, gait disorder and visual deficits. Pt presented on 05/08/19 with sudden right-sided weakness and left gaze deviation. CT head showed abnormal hypodensity left colossal body and possible pericallosal gyrus. 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 CTA chest showed diffuse lung opacity concerning for PNA and dissection or aneurysm . He underwent cerebral angiogram with attempted thrombectomy of left A2 occlusion without significant improvement in flow and nonflow limiting dissection of distal cervical segment identified. Neurology felt the stroke was embolic in setting of subtherapeutic INR. Pt was placed on dysphagia 1 diet with honey-thick liquids due to dysphagia on 05/10/19. Chest x-ray performed on 05/12/19 suggestive of aspiration pneumonia. Pt was made NPO and Cortrak placed for nutrition support. Pt admitted to CIR on 05/13/19.  01/01- OG tube placed 01/02 - OG removed 01/05 - Cortrak placed 01/15 - s/p MBSS with recommendations for Dysphagia 1  diet with honey-thick liquids 01/28 - s/p MBSS with recommendations for Dysphagia 2 diet with necar-thick liquids 01/29 - Cortrak removed  Noted anticipated discharge date of 06/09/19.   RN states pt taking Pro-stat well and that pt did well with Magic Cup previously. Pt reports preference for the orange flavor only.   Diet Order: DYS2 Nectar thick PO Intake: 25-100% x last 8 recorded meals (71% average meal intake)  Medications reviewed and include: 23mL Pro-stat BID, Lasix, SSI, Coumadin  Labs reviewed. CBGs 74-104  UOP: x24 hours I/O: -1,462mL since admit   NUTRITION - FOCUSED PHYSICAL EXAM:    Most Recent Value  Orbital Region  No depletion  Upper Arm Region Mild depletion  Thoracic and Lumbar Region  No depletion  Buccal Region  No depletion  Temple Region  No depletion  Clavicle Bone Region  Mild depletion  Clavicle and Acromion Bone Region  Mild depletion  Scapular Bone Region Mild depletion  Dorsal Hand  No depletion  Patellar Region  Mild depletion  Anterior Thigh Region  Mild depletion  Posterior Calf Region  Mild depletion  Edema (RD Assessment)  None  Hair  Reviewed  Eyes  Reviewed  Mouth  Reviewed  Skin  Reviewed  Nails  Reviewed       Diet Order:   Diet Order            DIET DYS 2 Room service appropriate? Yes with Assist; Fluid consistency: Nectar Thick  Diet effective now              EDUCATION NEEDS:   No education needs have been identified at this time  Skin:  Skin Assessment: Skin Integrity Issues: Skin Integrity Issues::  Incisions Incisions: right groin  Last BM:  06/08/19  Height:   Ht Readings from Last 1 Encounters:  05/16/19 5\' 11"  (1.803 m)    Weight:   Wt Readings from Last 1 Encounters:  06/05/19 101.4 kg    Ideal Body Weight:  78.2 kg  BMI:  Body mass index is 31.18 kg/m.  Estimated Nutritional Needs:   Kcal:  4888-9169  Protein:  115-130 grams  Fluid:  >/= 2.0 L    Larkin Ina, MS, RD,  LDN Pager: [phone] Weekend/After Hours Pager: [phone]

## 2019-06-08 NOTE — Progress Notes (Signed)
Speech Language Pathology Discharge Summary  Patient Details  Name: Jon [name] MRN: [id number] Date of Birth: 1966-05-06  Today's Date: 06/08/2019 SLP Individual Time: 0826-0927 SLP Individual Time Calculation (min): 61 min   Skilled Therapeutic Interventions:  Pt was seen for skilled ST targeting dysphagia and cognitive goals. SLP re-administered Cognistat to gauge progress toward cognitive goals during this admission. Pt scored WFL for all subtests with the exception of abstract thinking (mild deficit) and attention (moderate deficits). Pt demonstrated vast improvements on problem solving subtests. Pt completed basic calculations independently X3 and when provided pen and paper and Supervision A verbal cues, completed final calculation accurately (division). Results discussed with pt and emphasized impact of attention and impulsivity on functioning at home.  During skilled observation of pt consuming 4oz nectar thick juice, pt with improved ability to use slow rate, small sips, and extra dry swallow, however moderate cues still required for implementation and or/accuracy in timing of chin tuck. Pt also consumed Dys 2 (minced/ground) snack with mildly prolonged mastication, but complete oral clearance, and mastication appeared to be functional for pt. No overt s/sx noted during intake of solids or liquids, however pt noted to intermittently cough throughout session (both prior to and following PO intake). Of note, pt was ~80% intelligible at the phrase level today with only Min A verbal cues for repeating messages using pacing, increased vocal intensity, and overarticulation. Pt much more aware of verbal errors now. Pt left laying in bed with alarm set and needs within reach. Continue per current plan of care.       Patient has met 5 of 7 long term goals.  Patient to discharge at overall Min;Mod level.  Reasons goals not met: Pt still required moderate cues to utilize swallow strategies  (chin tuck and extra dry swallows with liquids, extra dry swallows with solids)   Clinical Impression/Discharge Summary:   Pt made functional gains and met 5 out of 7 long term goals this admission. Pt currently requires anywhere for from [place] assist for cognitive due to impairments ranging in severity from mild to moderate in the areas of sustained attention, problem solving, and impulsivity. Pt's decreased attention and impulsivity now have the greatest impact on him functionally; problem solving has improved since admissoin, however mild deficits still noted. Pt will required 24/7 supervision and assistance at discharge. Pt is consuming Dys 2 (minced/ground) diet with nectar thick liquids with Mod A verbal cues for use of swallow strategies. Pt's swallow strategies noted to improve oropharyngeal swallow function on most recent MBSS include: chin tuck, extra 1-2 dry swallows, slow rate, small bites/sips. Pt does not have to perform chin tuck with solid intake, but should still execute dry swallow to reduce residue. Pt's oropharyngeal swallow function has significantly improved since admission, however moderate dysphagia still present. In follow up level of are, recommend trials of ice and teaspoons thin H2O after oral care in order to assess readiness for repeat MBSS as outpatient. Pt's speech intelligibility has also significantly improved since admission; he is 75-80% intelligible at the phrase level and uses speech strategies (paticularly pacing, overstimulate, increased vocal intensity) with Min A verbal cues. However, given dysphagia, dysarthria, motor speech impairments, and cognitive deficits still present, recommend pt continue to receive skilled ST services upon discharge. Pt and family education is complete at this time.   Care Partner:  Caregiver Able to Provide Assistance: Yes  Type of Caregiver Assistance: Cognitive  Recommendation:  Home Health SLP;24 hour supervision/assistance   Rationale for  SLP Follow Up: Maximize functional communication;Maximize swallowing safety;Maximize cognitive function and independence;Reduce caregiver burden   Equipment: none   Reasons for discharge: Discharged from hospital   Patient/Family Agrees with Progress Made and Goals Achieved: Yes    Arbutus Leas 06/08/2019, 7:25 AM

## 2019-06-08 NOTE — Progress Notes (Signed)
ANTICOAGULATION CONSULT NOTE - Follow Up Consult  Pharmacy Consult for Coumadin Indication:  Afib + mech AVR.  New CVA.  Allergies  Allergen Reactions  . Hydrocodone-Acetaminophen Other (See Comments)    PT MOTHER DOES NOT REMEMBER REACTION  Only use in Emergency   . Hydromorphone Itching and Rash       . Other Other (See Comments)    ALL NARCOTICS-ITCHING, RASH   . Oxycodone Other (See Comments)    Unknown reaction  . Penicillins Rash    Did it involve swelling of the face/tongue/throat, SOB, or low BP? Unknown Did it involve sudden or severe rash/hives, skin peeling, or any reaction on the inside of your mouth or nose? Unknown Did you need to seek medical attention at a hospital or doctor's office? Unknown When did it last happen?unknown If all above answers are "NO", may proceed with cephalosporin use.  . Dilaudid [Hydromorphone Hcl] Itching and Rash    Patient Measurements: Height: 5\' 11"  (180.3 cm) Weight: 223 lb 8.7 oz (101.4 kg) IBW/kg (Calculated) : 75.3 Heparin Dosing Weight:    Vital Signs: BP: 111/76 (02/01 0553) Pulse Rate: 64 (02/01 0603)  Labs: Recent Labs    06/06/19 0709 06/07/19 0716 06/08/19 0614  HGB  --   --  12.5*  HCT  --   --  39.4  PLT  --   --  308  LABPROT 32.7* 30.3* 29.1*  INR 3.2* 2.9* 2.8*  CREATININE  --   --  1.04    Estimated Creatinine Clearance: 99.6 mL/min (by C-G formula based on SCr of 1.04 mg/dL).  Assessment:  Anticoag: Coumadin  PTA for hx Afib + mech AVR.  New CVA. Discharged from [facility], received Lovenox 12/31 PM (family was not giving Coumadin) >> started Heparin, restart Coumadin 1/4. D/c Hep bridge 1/11 INR 2.8 (goal 2.5-3.5), Hgb 12.5  - PTA dose: 5mg  alternating with 2.5mg   Goal of Therapy:  2.5-3.5 Monitor platelets by anticoagulation protocol: Yes   Plan:  Coumadin 7.5mg  po  daily. (INR 2.5-3.5 goal) Change INR to MWF Watch for INR drop now off 3/11 S. , PharmD,  BCPS Clinical Staff Pharmacist [email or web address], Merilynn Finland 06/08/2019,10:23 AM

## 2019-06-08 NOTE — Progress Notes (Signed)
Occupational Therapy Session Note  Patient Details  Name: Jon [name] MRN: [id number] Date of Birth: 27-Oct-1965  Today's Date: 06/08/2019 OT Individual Time: 1400-1457 OT Individual Time Calculation (min): 57 min    Short Term Goals: Week 4:  OT Short Term Goal 1 (Week 4): STG = LTGs due to remaining LOS  Skilled Therapeutic Interventions/Progress Updates:    Completed ADL retraining at overall CGA level.  Pt ambulated to bathroom with RW with CGA and completed toilet transfers and walk-in shower transfers with CGA with RW.  Pt completed bathing at sit > stand level at supervision overall, until pt attempted to turn around in shower requiring CGA for safety due to impulsivity.  Utilized wash mitt on RUE during bathing to eliminate dropping of cloth secondary to decreased sensation and proprioception.  Completed dressing at sit > stand level with CGA for LB dressing. Pt able to don socks/shoes with increased time and supervision this session.  Pt returned to supine in bed at end of session.  Pt ready for d/c tomorrow.  Therapy Documentation Precautions:  Precautions Precautions: Fall Precaution Comments: R hemi, dysarthria, dysphagia, impulsive Restrictions Weight Bearing Restrictions: No General:   Vital Signs: Therapy Vitals Temp: 97.9 F (36.6 C) Pulse Rate: 62 Resp: 16 BP: 118/74 Patient Position (if appropriate): Lying Oxygen Therapy SpO2: 98 % O2 Device: Room Air Pain: Pain Assessment Pain Scale: 0-10 Pain Score: 0-No pain ADL: ADL Eating: Supervision/safety, Minimal cueing Where Assessed-Eating: Chair Grooming: Supervision/safety Where Assessed-Grooming: Sitting at sink Upper Body Bathing: Supervision/safety Where Assessed-Upper Body Bathing: Shower Lower Body Bathing: Contact guard Where Assessed-Lower Body Bathing: Shower Upper Body Dressing: Supervision/safety Where Assessed-Upper Body Dressing: Edge of bed Lower Body Dressing: Contact guard Where  Assessed-Lower Body Dressing: Edge of bed Toileting: Supervision/safety Where Assessed-Toileting: Teacher, adult education: Close supervision, Furniture conservator/restorer Method: Proofreader: Grab bars(RW) Tub/Shower Transfer: Scientific laboratory technician Method: Ship broker: Insurance underwriter: Close supervision Film/video editor Method: Designer, industrial/product: Emergency planning/management officer, Grab bars   Therapy/Group: Individual Therapy  Rosalio Loud 06/08/2019, 3:13 PM

## 2019-06-09 LAB — GLUCOSE, CAPILLARY
Glucose-Capillary: 89 mg/dL (ref 70–99)
Glucose-Capillary: 103 mg/dL — ABNORMAL HIGH (ref 70–99)

## 2019-06-09 MED ORDER — LEVOTHYROXINE SODIUM 50 MCG PO TABS: 50.0000 ug | ORAL_TABLET | Freq: Every day | ORAL | 0 refills | Status: DC

## 2019-06-09 MED ORDER — UMECLIDINIUM BROMIDE 62.5 MCG/INH IN AEPB: 1.0000 | INHALATION_SPRAY | Freq: Every day | RESPIRATORY_TRACT | 0 refills | Status: AC

## 2019-06-09 MED ORDER — ROSUVASTATIN CALCIUM 40 MG PO TABS: 40.0000 mg | ORAL_TABLET | Freq: Every day | ORAL | 0 refills | Status: AC

## 2019-06-09 MED ORDER — TOPIRAMATE 50 MG PO TABS: 50.0000 mg | ORAL_TABLET | Freq: Every day | ORAL | 0 refills | Status: AC

## 2019-06-09 MED ORDER — PANTOPRAZOLE SODIUM 40 MG PO TBEC: 40.0000 mg | DELAYED_RELEASE_TABLET | Freq: Every day | ORAL | 0 refills | Status: AC

## 2019-06-09 MED ORDER — WARFARIN SODIUM 7.5 MG PO TABS: 7.5000 mg | ORAL_TABLET | Freq: Every day | ORAL | 0 refills | Status: DC

## 2019-06-09 MED ORDER — SENNOSIDES-DOCUSATE SODIUM 8.6-50 MG PO TABS: 1.0000 | ORAL_TABLET | Freq: Every evening | ORAL | 0 refills | Status: DC | PRN

## 2019-06-09 MED ORDER — RESOURCE THICKENUP CLEAR PO POWD: ORAL | 1 refills | Status: DC

## 2019-06-09 MED ORDER — WARFARIN SODIUM 7.5 MG PO TABS: 7.5000 mg | ORAL_TABLET | Freq: Every day | ORAL | 0 refills | Status: AC

## 2019-06-09 MED ORDER — FUROSEMIDE 40 MG PO TABS: 40.0000 mg | ORAL_TABLET | Freq: Two times a day (BID) | ORAL | 0 refills | Status: DC

## 2019-06-09 MED ORDER — GABAPENTIN 300 MG PO CAPS: 300.0000 mg | ORAL_CAPSULE | Freq: Three times a day (TID) | ORAL | 0 refills | Status: DC

## 2019-06-09 MED ORDER — ACETAMINOPHEN 325 MG PO TABS: 325.0000 mg | ORAL_TABLET | ORAL | Status: AC | PRN

## 2019-06-09 MED ORDER — ASPIRIN 81 MG PO CHEW: 81.0000 mg | CHEWABLE_TABLET | Freq: Every day | ORAL | 1 refills | Status: AC

## 2019-06-09 NOTE — Progress Notes (Signed)
Discharge instructions provided to pt and family by P.Love, PA. Pt discharged with personal belongings. Pt and family verbalized an understanding of discharge instructions as well as follow up appts.

## 2019-06-09 NOTE — Plan of Care (Signed)
Pt discharging to home.

## 2019-06-09 NOTE — Progress Notes (Signed)
Social Work Discharge Note   The overall goal for the admission was met for:   Discharge location: Yes - pt to initially d/c to brother's home where sister-in-law and mother will provide 24/7 assistance  Length of Stay: Yes - 27 days  Discharge activity level: Yes - minimal assistance overall  Home/community participation: Yes  Services provided included: MD, RD, PT, OT, SLP, RN, TR, Pharmacy, Neuropsych and SW  Financial Services: Medicaid  Follow-up services arranged: Outpatient: PT, OT, ST via [facility], DME: 20x18 lightweight w/c, cushion, walker, tub bench via [facility] and Patient/Family has no preference for HH/DME agencies  Comments (or additional information):  Contact - mother, Warrick Parisian @ [phone] or ([phone]  Patient/Family verbalized understanding of follow-up arrangements: Yes  Individual responsible for coordination of the follow-up plan: pt  Confirmed correct DME delivered: Lennart Pall 06/09/2019    Honorio Devol, Lorre Nick

## 2019-06-09 NOTE — Discharge Instructions (Signed)
Inpatient Rehab Discharge Instructions  Jon [name] Discharge date and time:  06/09/19  Activities/Precautions/ Functional Status: Activity: no lifting, driving, or strenuous exercise till cleared by MD Diet: cardiac diet and diabetic diet--Chopped food/nectar thin liquids.  Wound Care: none needed   Functional status:  ___ No restrictions     ___ Walk up steps independently _X__ 24/7 supervision/assistance   ___ Walk up steps with assistance ___ Intermittent supervision/assistance  ___ Bathe/dress independently ___ Walk with walker     ___ Bathe/dress with assistance ___ Walk Independently    ___ Shower independently ___ Walk with assistance    _X__ Shower with assistance _X__ No alcohol     ___ Return to work/school ________     COMMUNITY REFERRALS UPON DISCHARGE:     Outpatient: PT     OT    ST                 Agency:  [facility] Phone:  [phone]               Appointment Date/Time: 2/8 @ 1:15 - 4:15  (arrive by 1:00pm)   Medical Equipment/Items Ordered:  Wheelchair, cushion, walker, tub bench                                                      Agency/Supplier:  [facility] @ [phone]   Special Instructions: 1. Need to take small sips of liquids. Need to tuck your chin with all liquids to prevent aspiration.   2. Medications need to be taken with puree or pudding.     STROKE/TIA DISCHARGE INSTRUCTIONS SMOKING Cigarette smoking nearly doubles your risk of having a stroke & is the single most alterable risk factor  If you smoke or have smoked in the last 12 months, you are advised to quit smoking for your health.  Most of the excess cardiovascular risk related to smoking disappears within a year of stopping.  Ask you doctor about anti-smoking medications  [place] Quit Line: 1-800-QUIT NOW  Free Smoking Cessation Classes [phone]  CHOLESTEROL Know your levels; limit fat & cholesterol in your diet  Lipid Panel       Component Value Date/Time   CHOL 132 05/09/2019 0504   TRIG 157 (H) 05/09/2019 0504   HDL 34 (L) 05/09/2019 0504   CHOLHDL 3.9 05/09/2019 0504   VLDL 31 05/09/2019 0504   LDLCALC 67 05/09/2019 0504      Many patients benefit from treatment even if their cholesterol is at goal.  Goal: Total Cholesterol (CHOL) less than 160  Goal:  Triglycerides (TRIG) less than 150  Goal:  HDL greater than 40  Goal:  LDL (LDLCALC) less than 100   BLOOD PRESSURE American Stroke Association blood pressure target is less that 120/80 mm/Hg  Your discharge blood pressure is:  BP: 110/73  Monitor your blood pressure  Limit your salt and alcohol intake  Many individuals will require more than one medication for high blood pressure  DIABETES (A1c is a blood sugar average for last 3 months) Goal HGBA1c is under 7% (HBGA1c is blood sugar average for last 3 months)  Diabetes:     Lab Results  Component Value Date   HGBA1C 6.9 (H) 05/09/2019     Your HGBA1c can be lowered with medications, healthy diet, and  exercise.  Check your blood sugar as directed by your physician  Call your physician if you experience unexplained or low blood sugars.  PHYSICAL ACTIVITY/REHABILITATION Goal is 30 minutes at least 4 days per week  Activity: No driving, Therapies: See above Return to work: N/A  Activity decreases your risk of heart attack and stroke and makes your heart stronger.  It helps control your weight and blood pressure; helps you relax and can improve your mood.  Participate in a regular exercise program.  Talk with your doctor about the best form of exercise for you (dancing, walking, swimming, cycling).  DIET/WEIGHT Goal is to maintain a healthy weight  Your discharge diet is:  Diet Order            DIET DYS 2 Room service appropriate? Yes with Assist; Fluid consistency: Nectar Thick  Diet effective now            liquids Your height is:  Height: 5\' 11"  (180.3 cm) Your current weight  is: Weight: 222 lbs Your Body Mass Index (BMI) is:  BMI (Calculated): 31.1  Following the type of diet specifically designed for you will help prevent another stroke.  Your goal weight is: 179 lbs  Your goal Body Mass Index (BMI) is 19-24.  Healthy food habits can help reduce 3 risk factors for stroke:  High cholesterol, hypertension, and excess weight.  RESOURCES Stroke/Support Group:  Call [phone]   STROKE EDUCATION PROVIDED/REVIEWED AND GIVEN TO PATIENT Stroke warning signs and symptoms How to activate emergency medical system (call 911). Medications prescribed at discharge. Need for follow-up after discharge. Personal risk factors for stroke. Pneumonia vaccine given:  Flu vaccine given:  My questions have been answered, the writing is legible, and I understand these instructions.  I will adhere to these goals & educational materials that have been provided to me after my discharge from the hospital.     My questions have been answered and I understand these instructions. I will adhere to these goals and the provided educational materials after my discharge from the hospital.  Patient/Caregiver Signature _______________________________ Date __________  Clinician Signature _______________________________________ Date __________  Please bring this form and your medication list with you to all your follow-up doctor's appointments. Information on my medicine - Coumadin   (Warfarin)  This medication education was reviewed with me or my healthcare representative as part of my discharge preparation.   Why was Coumadin prescribed for you? Coumadin was prescribed for you because you have a blood clot or a medical condition that can cause an increased risk of forming blood clots. Blood clots can cause serious health problems by blocking the flow of blood to the heart, lung, or brain. Coumadin can prevent harmful blood clots from forming. As a reminder your indication for Coumadin is:    Stroke Prevention Because Of Atrial Fibrillation  What test will check on my response to Coumadin? While on Coumadin (warfarin) you will need to have an INR test regularly to ensure that your dose is keeping you in the desired range. The INR (international normalized ratio) number is calculated from the result of the laboratory test called prothrombin time (PT).  If an INR APPOINTMENT HAS NOT ALREADY BEEN MADE FOR YOU please schedule an appointment to have this lab work done by your health care provider within 7 days. Your INR goal is 2.5-3.5 because you have a mechanical heart valve.   What  do you need to  know  About  COUMADIN? Take Coumadin (  warfarin) exactly as prescribed by your healthcare provider about the same time each day.  DO NOT stop taking without talking to the doctor who prescribed the medication.  Stopping without other blood clot prevention medication to take the place of Coumadin may increase your risk of developing a new clot or stroke.  Get refills before you run out.  What do you do if you miss a dose? If you miss a dose, take it as soon as you remember on the same day then continue your regularly scheduled regimen the next day.  Do not take two doses of Coumadin at the same time.  Important Safety Information A possible side effect of Coumadin (Warfarin) is an increased risk of bleeding. You should call your healthcare provider right away if you experience any of the following: ? Bleeding from an injury or your nose that does not stop. ? Unusual colored urine (red or dark brown) or unusual colored stools (red or black). ? Unusual bruising for unknown reasons. ? A serious fall or if you hit your head (even if there is no bleeding).  Some foods or medicines interact with Coumadin (warfarin) and might alter your response to warfarin. To help avoid this: ? Eat a balanced diet, maintaining a consistent amount of Vitamin K. ? Notify your provider about major diet changes  you plan to make. ? Avoid alcohol or limit your intake to 1 drink for women and 2 drinks for men per day. (1 drink is 5 oz. wine, 12 oz. beer, or 1.5 oz. liquor.)  Make sure that ANY health care provider who prescribes medication for you knows that you are taking Coumadin (warfarin).  Also make sure the healthcare provider who is monitoring your Coumadin knows when you have started a new medication including herbals and non-prescription products.  Coumadin (Warfarin)  Major Drug Interactions  Increased Warfarin Effect Decreased Warfarin Effect  Alcohol (large quantities) Antibiotics (esp. Septra/Bactrim, Flagyl, Cipro) Amiodarone (Cordarone) Aspirin (ASA) Cimetidine (Tagamet) Megestrol (Megace) NSAIDs (ibuprofen, naproxen, etc.) Piroxicam (Feldene) Propafenone (Rythmol SR) Propranolol (Inderal) Isoniazid (INH) Posaconazole (Noxafil) Barbiturates (Phenobarbital) Carbamazepine (Tegretol) Chlordiazepoxide (Librium) Cholestyramine (Questran) Griseofulvin Oral Contraceptives Rifampin Sucralfate (Carafate) Vitamin K   Coumadin (Warfarin) Major Herbal Interactions  Increased Warfarin Effect Decreased Warfarin Effect  Garlic Ginseng Ginkgo biloba Coenzyme Q10 Green tea St. John's wort    Coumadin (Warfarin) FOOD Interactions  Eat a consistent number of servings per week of foods HIGH in Vitamin K (1 serving =  cup)  Collards (cooked, or boiled & drained) Kale (cooked, or boiled & drained) Mustard greens (cooked, or boiled & drained) Parsley *serving size only =  cup Spinach (cooked, or boiled & drained) Swiss chard (cooked, or boiled & drained) Turnip greens (cooked, or boiled & drained)  Eat a consistent number of servings per week of foods MEDIUM-HIGH in Vitamin K (1 serving = 1 cup)  Asparagus (cooked, or boiled & drained) Broccoli (cooked, boiled & drained, or raw & chopped) Brussel sprouts (cooked, or boiled & drained) *serving size only =  cup Lettuce, raw  (green leaf, endive, romaine) Spinach, raw Turnip greens, raw & chopped   These websites have more information on Coumadin (warfarin):  http://www.king-russell.com/; https://www.hines.net/;

## 2019-06-09 NOTE — Discharge Summary (Addendum)
Physician Discharge Summary  Patient ID: Arhan Mcmanamon MRN: [id number] DOB/AGE: 1966/01/26 54 y.o.  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 EXAM: CHEST - 2 VIEW COMPARISON:  May 12, 2019. FINDINGS: Stable cardiomediastinal silhouette. Status post cardiac valve repair. Feeding tube is seen entering stomach. Left-sided pacemaker is unchanged. No pneumothorax or significant pleural effusion is noted. Minimal bibasilar subsegmental atelectasis or infiltrates are noted. Bony thorax is unremarkable. IMPRESSION: Minimal bibasilar subsegmental atelectasis or infiltrates are noted. Electronically Signed   By: Lupita Raider M.D.   On: 05/20/2019 12:46   DG Abd 1 View  Result Date: 05/20/2019 CLINICAL DATA:  Abdominal pain, recent stroke EXAM: ABDOMEN - 1 VIEW COMPARISON:  10/27/2018 FINDINGS: Tip of feeding tube projects over pylorus/duodenal bulb. Normal bowel gas pattern. No bowel dilatation or bowel wall thickening. Osseous structures demineralized. Surgical clips RIGHT upper quadrant likely reflect cholecystectomy. No urinary tract calcification. IMPRESSION: No acute abnormalities. Electronically Signed   By: Ulyses Southward M.D.   On: 05/20/2019 11:13   DG CHEST PORT 1 VIEW  Result Date: 05/12/2019 CLINICAL DATA:  Aspiration into airway. EXAM: PORTABLE CHEST 1 VIEW COMPARISON:  Chest radiograph  05/09/2019 FINDINGS: Unchanged cardiomegaly. Aortic atherosclerosis. Redemonstrated left chest dual lead pacer. Prior median sternotomy with cardiac valve prosthesis. Shallow inspiration radiograph. Similar appearance of interstitial and ill-defined opacity throughout the left lung. No sizable pleural effusion or evidence of pneumothorax. No acute bony abnormality. IMPRESSION: Shallow inspiration radiograph. Similar appearance of interstitial and ill-defined opacities throughout the left lung. Findings are suspicious for pneumonia given provided history. Asymmetric edema is also a consideration. Electronically Signed   By: Jackey Loge DO   On: 05/12/2019 10:59   DG Swallowing Func-Speech Pathology  Result Date: 06/04/2019 Objective Swallowing Evaluation: Type of Study: MBS-Modified Barium Swallow Study  Patient Details Name: Estaban Mainville MRN: [id number] Date of Birth: 1966/03/15 Today's Date: 06/04/2019 [id number] 11 mins Past Medical History: Past Medical History: Diagnosis Date  Aneurysm of thoracic aorta (HCC) 05/06/2012  IMO routine update IMO routine update  Anxiety   sees Dr. 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 Geronimo Running, [place] [facility] in [place]  Generalized constriction of visual field 10/15/2012  IMOUPDATE IMOUPDATE  Generalized ischemic cerebrovascular disease 07/14/2013  GERD (gastroesophageal reflux disease)   HCAP (healthcare-associated pneumonia) 05/06/2012  Headache(784.0)   "due to vision  problem"  History of CVA (cerebrovascular accident) 05/07/2012  Hypothyroidism   Idiopathic peripheral neuropathy 07/14/2013  Late effects of CVA (cerebrovascular accident)  08/08/2015  Migraine   Nontraumatic multiple localized intracerebral hemorrhages (HCC) 10/16/2017  Paroxysmal atrial fibrillation (HCC) 08/08/2015  On warfarin On warfarin  Presence of prosthetic heart valve 08/08/2015  Shortness of breath   Stroke (HCC)   x3  Stroke, embolic (HCC) 05/07/2012  Syncope 05/06/2012  Thoracic aortic aneurysm (HCC)   replacement aortic root 04/22/12  VBI (vertebrobasilar insufficiency) 09/10/2017 Past Surgical History: Past Surgical History: Procedure Laterality Date  APPLICATION OF WOUND VAC  06/11/2012  Procedure: APPLICATION OF WOUND VAC;  Surgeon: [name], MD;  Location: [facility] OR;  Service: Vascular;  Laterality: Right;  CARDIAC CATHETERIZATION    CHOLECYSTECTOMY    CORONARY ARTERY BYPASS GRAFT  04/22/2012  Procedure: CORONARY ARTERY BYPASS GRAFTING (CABG);  Surgeon: [name], MD;  Location: [facility] OR;  Service: Open Heart Surgery;  Laterality: N/A;  times one with right greater saphenous vein graft  I & D EXTREMITY  06/11/2012  Procedure: IRRIGATION AND DEBRIDEMENT EXTREMITY;  Surgeon: [name], MD;  [facility] OR;  Service: Vascular;  Laterality: Right;  INTRAOPERATIVE TRANSESOPHAGEAL ECHOCARDIOGRAM  04/22/2012  Procedure: INTRAOPERATIVE TRANSESOPHAGEAL ECHOCARDIOGRAM;  Surgeon: [name], MD;  Location[facility] OR;  Service: Open Heart Surgery;  Laterality: N/A;  IR CT HEAD LTD  05/08/2019  IR PERCUTANEOUS ART THROMBECTOMY/INFUSION INTRACRANIAL INC DIAG ANGIO  05/08/2019  IR US GUIDE VASC ACCESS RIGHT  05/08/2019  PERMANENT PACEMAKER INSERTION  10/2018  St[employer] Jude's dual PPM  RADIOLOGY WITH ANESTHESIA N/A 05/08/2019  Procedure: IR WITH ANESTHESIA;  Surgeon: Radiologist, Medication, MD;  Location: [facility] OR;  Service: Radiology;  Laterality: N/A;  ROTATOR CUFF REPAIR    12 years ago, Left  THORACIC AORTIC  ANEURYSM REPAIR  04/22/2012  Procedure: THORACIC ASCENDING ANEURYSM REPAIR (AAA);  Surgeon: [name], MD;  Location: [facility] OR;  Service: Open Heart Surgery;  Laterality: N/A; HPI: 54 yo admitted with right weakness and left gaze with aphasia with acute left ACA anterior territory CVA s/p revascularization. VDRF 1/1-1/2. PMhx: dysarthria, aortic dissection s/p repair, AVR, GERD, CVA x 3. Prior CVA resulted in verbosity, cognitive impairment including impulsivity, mild dysphagia. Admitted to CIR 05/13/19.  No data recorded Assessment / Plan / Recommendation CHL IP CLINICAL IMPRESSIONS 06/04/2019 Clinical Impression --Pt presents with moderate oropharyngeal dysphagia but improved ability to protect airway with use of compensatory strategies since last MBSS. Pt's oral phase is characterized by reduced oral control of boluses and weak lingual manipulation, which results in various levels of lingual residue post-swallow depending on bolus texture as well as premature spillage of liquids. Although Honey thick barium was consumed without any penetration or aspiration when implementing chin tuck, moderate oral residue required dry swallows X3-4 to clear. Nectar thick barium with chin tuck strategy resulted in flash penetration into laryngeal vestibule X1 and penetration above vocal folds X1, but in both instances penetrates were cleared either ejected during initial swallow or with an extra dry swallow. Nectar also resulted in less lingual/palatal residue post swallow in comparison to honey. Swallow initiation of thin barium occurred at the level of the pyriform sinuses and immediate sensed aspiration during the swallow. Significantly reduced oral control of thin presents mod-severe aspiration risk with this texture. Chin tuck was not effective to further protect airway with thin. Pt also exhibited prolonged mastication of Dys 3 (mech soft) solids and moderate oral residue, however airway protection remained in tact.  Recommend pt upgrade to Dys 2 (ground/minced) texture diet, nectar thick liquids, meds crushed in puree. Pt MUST use  chin tuck, followed by extra dry swallow with nectar. Although chin tuck not required for solids, he must still perform extra dry swallows to assist with oral clearance. Full supervision should be provided to ensure use of swallow strategies, in addition to assistance with self feeding. SLP Visit Diagnosis Dysphagia, oropharyngeal phase (R13.12) Attention and concentration deficit following -- Frontal lobe and executive function deficit following -- Impact on safety and function Moderate aspiration risk;Risk for inadequate nutrition/hydration   CHL IP TREATMENT RECOMMENDATION 06/04/2019 Treatment Recommendations F/U MBS in --- days (Comment);Therapy as outlined in treatment plan below   Prognosis 06/04/2019 Prognosis for Safe Diet Advancement Good Barriers to Reach Goals Cognitive deficits;Behavior Barriers/Prognosis Comment -- CHL IP DIET RECOMMENDATION 06/04/2019 SLP Diet Recommendations Dysphagia 2 (Fine chop) solids;Nectar thick liquid;Other (Comment) Liquid Administration via Cup;Spoon Medication Administration Crushed with puree Compensations Slow rate;Small sips/bites;Other (Comment) Postural Changes Remain semi-upright after after feeds/meals (Comment)   CHL IP OTHER RECOMMENDATIONS 06/04/2019 Recommended Consults -- Oral Care Recommendations Oral care BID Other Recommendations Order thickener from pharmacy;Prohibited food (jello, ice cream, thin soups);Remove water pitcher;Have oral suction available   CHL IP FOLLOW UP RECOMMENDATIONS 06/04/2019 Follow up Recommendations Home health SLP   CHL IP FREQUENCY AND DURATION 05/10/2019 Speech Therapy Frequency (ACUTE ONLY) min 2x/week Treatment Duration --      CHL IP ORAL PHASE 06/04/2019 Oral Phase Impaired Oral - Pudding Teaspoon -- Oral - Pudding Cup -- Oral - Honey Teaspoon NT Oral - Honey Cup Lingual pumping;Decreased bolus cohesion;Premature  spillage;Lingual/palatal residue Oral - Nectar Teaspoon NT Oral - Nectar Cup Premature spillage;Lingual/palatal residue;Decreased bolus cohesion Oral - Nectar Straw -- Oral - Thin Teaspoon NT Oral - Thin Cup Decreased bolus cohesion;Premature spillage Oral - Thin Straw NT Oral - Puree NT Oral - Mech Soft Decreased bolus cohesion;Delayed oral transit;Impaired mastication;Weak lingual manipulation;Lingual/palatal residue Oral - Regular -- Oral - Multi-Consistency -- Oral - Pill -- Oral Phase - Comment --  CHL IP PHARYNGEAL PHASE 06/04/2019 Pharyngeal Phase Impaired Pharyngeal- Pudding Teaspoon -- Pharyngeal -- Pharyngeal- Pudding Cup -- Pharyngeal -- Pharyngeal- Honey Teaspoon NT Pharyngeal -- Pharyngeal- Honey Cup Reduced epiglottic inversion;Delayed swallow initiation-vallecula;Compensatory strategies attempted (with notebox) Pharyngeal -- Pharyngeal- Nectar Teaspoon NT Pharyngeal -- Pharyngeal- Nectar Cup Delayed swallow initiation-vallecula;Reduced epiglottic inversion;Penetration/Aspiration during swallow;Compensatory strategies attempted (with notebox) Pharyngeal Material enters airway, remains ABOVE vocal cords then ejected out Pharyngeal- Nectar Straw -- Pharyngeal -- Pharyngeal- Thin Teaspoon NT Pharyngeal -- Pharyngeal- Thin Cup Delayed swallow initiation-pyriform sinuses;Reduced airway/laryngeal closure;Penetration/Aspiration during swallow;Compensatory strategies attempted (with notebox) Pharyngeal Material enters airway, passes BELOW cords and not ejected out despite cough attempt by patient Pharyngeal- Thin Straw NT Pharyngeal -- Pharyngeal- Puree NT Pharyngeal -- Pharyngeal- Mechanical Soft WFL Pharyngeal -- Pharyngeal- Regular -- Pharyngeal -- Pharyngeal- Multi-consistency -- Pharyngeal -- Pharyngeal- Pill -- Pharyngeal -- Pharyngeal Comment --  CHL IP CERVICAL ESOPHAGEAL PHASE 06/04/2019 Cervical Esophageal Phase WFL Pudding Teaspoon -- Pudding Cup -- Honey Teaspoon -- Honey Cup -- Nectar Teaspoon --  Nectar Cup -- Nectar Straw -- Thin Teaspoon -- Thin Cup -- Thin Straw -- Puree -- Mechanical Soft -- Regular -- Multi-consistency -- Pill -- Cervical Esophageal Comment -- [name] 06/04/2019, 11:52 AM              DG Swallowing Func-Speech Pathology  Result Date: 05/22/2019 Objective Swallowing Evaluation: Type of Study: MBS-Modified Barium Swallow Study  Patient Details Name: Adler Chartrand MRN: [id number] Date of Birth: 24-Nov-1965 Today's Date: 05/22/2019 Time: SLP Start Time (ACUTE ONLY): 1116 -SLP Stop  Time (ACUTE ONLY): 1201 SLP Time Calculation (min) (ACUTE ONLY): 45 min Past Medical History: Past Medical History: Diagnosis Date  Aneurysm of thoracic aorta ([place]) 05/06/2012  IMO routine update IMO routine update  Anxiety   sees Dr. 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 Leslie Dales, [place] [facility] in [place]  Generalized constriction of visual field 10/15/2012  IMOUPDATE IMOUPDATE  Generalized ischemic cerebrovascular disease 07/14/2013  GERD (gastroesophageal reflux disease)   HCAP (healthcare-associated pneumonia) 05/06/2012  Headache(784.0)   "due to vision problem"  History of CVA (cerebrovascular accident) 05/07/2012  Hypothyroidism   Idiopathic peripheral neuropathy 07/14/2013  Late effects of CVA (cerebrovascular accident) 08/08/2015  Migraine   Nontraumatic multiple localized intracerebral hemorrhages ([place]) 10/16/2017  Paroxysmal atrial fibrillation ([place]) 08/08/2015  On warfarin On warfarin  Presence of  prosthetic heart valve 08/08/2015  Shortness of breath   Stroke ([place])   x3  Stroke, embolic ([place]) 0/0/8676  Syncope 05/06/2012  Thoracic aortic aneurysm (HCC)   replacement aortic root 04/22/12  VBI (vertebrobasilar insufficiency) 09/10/2017 Past Surgical History: Past Surgical History: Procedure Laterality Date  APPLICATION OF WOUND VAC  05/16/5091  Procedure: APPLICATION OF WOUND VAC;  Surgeon: Melrose Nakayama, MD;  Location: [place];  Service: Vascular;  Laterality: Right;  CARDIAC CATHETERIZATION    CHOLECYSTECTOMY    CORONARY ARTERY BYPASS GRAFT  04/22/2012  Procedure: CORONARY ARTERY BYPASS GRAFTING (CABG);  Surgeon: Melrose Nakayama, MD;  Location: [place];  Service: Open Heart Surgery;  Laterality: N/A;  times one with right greater saphenous vein graft  I & D EXTREMITY  06/11/2012  Procedure: IRRIGATION AND DEBRIDEMENT EXTREMITY;  Surgeon: Melrose Nakayama, MD;  Location: [place];  Service: Vascular;  Laterality: Right;  INTRAOPERATIVE TRANSESOPHAGEAL ECHOCARDIOGRAM  04/22/2012  Procedure: INTRAOPERATIVE TRANSESOPHAGEAL ECHOCARDIOGRAM;  Surgeon: Melrose Nakayama, MD;  Location: [place];  Service: Open Heart Surgery;  Laterality: N/A;  IR CT HEAD LTD  05/08/2019  IR PERCUTANEOUS ART THROMBECTOMY/INFUSION INTRACRANIAL INC DIAG ANGIO  05/08/2019  IR US GUIDE VASC ACCESS RIGHT  05/08/2019  PERMANENT PACEMAKER INSERTION  10/2018  St. Jude's dual PPM  RADIOLOGY WITH ANESTHESIA N/A 05/08/2019  Procedure: IR WITH ANESTHESIA;  Surgeon: Radiologist, Medication, MD;  Location: [place];  Service: Radiology;  Laterality: N/A;  ROTATOR CUFF REPAIR    12 years ago, Left  THORACIC AORTIC ANEURYSM REPAIR  04/22/2012  Procedure: THORACIC ASCENDING ANEURYSM REPAIR (AAA);  Surgeon: Melrose Nakayama, MD;  Location: [place];  Service: Open Heart Surgery;  Laterality: N/A; HPI: 54 yo admitted with right weakness and left gaze with aphasia with acute left ACA anterior territory CVA s/p revascularization. VDRF 1/1-1/2. PMhx: dysarthria,  aortic dissection s/p repair, AVR, GERD, CVA x 3. Prior CVA resulted in verbosity, cognitive impairment including impulsivity, mild dysphagia. In 2014 MBS recommended regualr thin with a chin tuck.  No data recorded Assessment / Plan / Recommendation CHL IP CLINICAL IMPRESSIONS 05/22/2019 Clinical Impression Pt with severe oral phase dysphagia that results in continued pharyngeal deficits in timing of swallow initiation. Pt demonstrates right anterior spillage,  lingual pumping and decreased bolus cohesion across puree, honey thick liquids, nectar thick liquids and thin liquids.  As a result, pt has moderate oral residue post-swallow with all consistencies assessed. Oral phase deficits also result in premature spillage of all liquid boluses, penetration and inconsistent aspiration during swallow. Aspiration is not always sensed. Chin tuck was helpful in increasing time for swallow initiation however pt had moderate amounts of oral residue that he aspirated post swallow. Suspect this is reason that pt had recent dx of aspiration pneumonia. In an effort to reduce oral residue (after the swallow) thin liquids via tsp were assessed with good airway protection established with chin tuck and decreased oral residue post swallow. However, pt not able to consistently condition to recieving thin liquid bolus and performing chin tuck during this study. If pt doesn't uitlize chin tuck he had sensed aspiration of thin liquids. Pt continues at high risk of aspiration given severity of oral phase dysphagia. At this time, conservative diet would be dysphagia 1 with pudding thick liquids, medicine crushed in puree. Recommend thin liquids via tsp be administered at bedside by SLP only to target use of chin tuck and facilitate swiftness of oral transit and pharyngeal phase. SLP Visit Diagnosis Dysphagia, oropharyngeal phase (R13.12) Attention and concentration deficit following -- Frontal lobe and executive function deficit following --  Impact on safety and function Severe aspiration risk;Risk for inadequate nutrition/hydration   CHL IP TREATMENT RECOMMENDATION 05/10/2019 Treatment Recommendations F/U MBS in --- days (Comment)   Prognosis 05/08/2012 Prognosis for Safe Diet Advancement Good Barriers to Reach Goals Cognitive deficits Barriers/Prognosis Comment -- CHL IP DIET RECOMMENDATION 05/22/2019 SLP Diet Recommendations Dysphagia 1 (Puree) solids;Honey thick liquids Liquid Administration via Spoon Medication Administration Via alternative means Compensations Slow rate;Small sips/bites Postural Changes Seated upright at 90 degrees   CHL IP OTHER RECOMMENDATIONS 05/22/2019 Recommended Consults -- Oral Care Recommendations Oral care BID Other Recommendations Order thickener from pharmacy;Prohibited food (jello, ice cream, thin soups);Remove water pitcher;Have oral suction available   CHL IP FOLLOW UP RECOMMENDATIONS 05/12/2019 Follow up Recommendations Inpatient Rehab   CHL IP FREQUENCY AND DURATION 05/10/2019 Speech Therapy Frequency (ACUTE ONLY) min 2x/week Treatment Duration --      CHL IP ORAL PHASE 05/22/2019 Oral Phase Impaired Oral - Pudding Teaspoon -- Oral - Pudding Cup -- Oral - Honey Teaspoon Decreased bolus cohesion;Delayed oral transit;Premature spillage;Reduced posterior propulsion;Lingual pumping;Weak lingual manipulation Oral - Honey Cup -- Oral - Nectar Teaspoon Weak lingual manipulation;Lingual pumping;Decreased bolus cohesion;Premature spillage;Delayed oral transit Oral - Nectar Cup -- Oral - Nectar Straw -- Oral - Thin Teaspoon Weak lingual manipulation;Lingual pumping;Delayed oral transit;Decreased bolus cohesion;Premature spillage Oral - Thin Cup NT Oral - Thin Straw Weak lingual manipulation;Lingual pumping;Delayed oral transit;Decreased bolus cohesion;Premature spillage Oral - Puree Weak lingual manipulation;Lingual pumping;Delayed oral transit;Decreased bolus cohesion Oral - Mech Soft -- Oral - Regular -- Oral - Multi-Consistency --  Oral - Pill -- Oral Phase - Comment --  CHL IP PHARYNGEAL PHASE 05/22/2019 Pharyngeal Phase Impaired Pharyngeal- Pudding Teaspoon -- Pharyngeal -- Pharyngeal- Pudding Cup -- Pharyngeal -- Pharyngeal- Honey Teaspoon Delayed swallow initiation-vallecula;Penetration/Aspiration during swallow Pharyngeal Material enters airway, passes BELOW cords without attempt by patient to eject out (silent aspiration);Material enters airway, passes BELOW cords and not ejected out despite cough attempt by patient Pharyngeal- Honey Cup -- Pharyngeal -- Pharyngeal- Nectar Teaspoon Delayed swallow initiation-pyriform sinuses;Penetration/Aspiration during swallow Pharyngeal Material enters airway, passes BELOW cords without attempt by patient to eject out (silent aspiration);Material enters airway, passes BELOW cords and  not ejected out despite cough attempt by patient Pharyngeal- Nectar Cup -- Pharyngeal -- Pharyngeal- Nectar Straw -- Pharyngeal -- Pharyngeal- Thin Teaspoon Delayed swallow initiation-pyriform sinuses;Penetration/Aspiration during swallow Pharyngeal Material enters airway, passes BELOW cords without attempt by patient to eject out (silent aspiration);Material enters airway, passes BELOW cords and not ejected out despite cough attempt by patient Pharyngeal- Thin Cup -- Pharyngeal -- Pharyngeal- Thin Straw Delayed swallow initiation-pyriform sinuses;Penetration/Aspiration during swallow Pharyngeal Material enters airway, passes BELOW cords without attempt by patient to eject out (silent aspiration) Pharyngeal- Puree Delayed swallow initiation-vallecula Pharyngeal -- Pharyngeal- Mechanical Soft -- Pharyngeal -- Pharyngeal- Regular -- Pharyngeal -- Pharyngeal- Multi-consistency -- Pharyngeal -- Pharyngeal- Pill -- Pharyngeal -- Pharyngeal Comment --  CHL IP CERVICAL ESOPHAGEAL PHASE 05/22/2019 Cervical Esophageal Phase WFL Pudding Teaspoon -- Pudding Cup -- Honey Teaspoon -- Honey Cup -- Nectar Teaspoon -- Nectar Cup -- Nectar  Straw -- Thin Teaspoon -- Thin Cup -- Thin Straw -- Puree -- Mechanical Soft -- Regular -- Multi-consistency -- Pill -- Cervical Esophageal Comment -- Happi Overton 05/22/2019, 11:59 AM               Labs:  Basic Metabolic Panel: BMP Latest Ref Rng & Units 06/08/2019 06/05/2019 06/01/2019  Glucose 70 - 99 mg/dL 161(W101(H) 960(A119(H) 540(J129(H)  BUN 6 - 20 mg/dL 14 14 15   Creatinine 0.61 - 1.24 mg/dL 8.111.04 9.141.00 7.820.95  BUN/Creat Ratio 9 - 20 - - -  Sodium 135 - 145 mmol/L 140 140 139  Potassium 3.5 - 5.1 mmol/L 3.5 3.7 3.8  Chloride 98 - 111 mmol/L 100 103 100  CO2 22 - 32 mmol/L 27 26 28   Calcium 8.9 - 10.3 mg/dL 9.5 9.1 9.1    CBC: CBC Latest Ref Rng & Units 06/08/2019 06/01/2019 05/25/2019  WBC 4.0 - 10.5 K/uL 8.1 6.6 8.6  Hemoglobin 13.0 - 17.0 g/dL 12.5(L) 12.0(L) 11.4(L)  Hematocrit 39.0 - 52.0 % 39.4 37.5(L) 38.0(L)  Platelets 150 - 400 K/uL 308 382 505(H)    Lab Results  Component Value Date   INR 2.8 (H) 06/08/2019   INR 2.9 (H) 06/07/2019   INR 3.2 (H) 06/06/2019   Recent Weights: Filed Weights   06/04/19 0351 06/05/19 0459 06/09/19 0500  Weight: 101.5 kg 101.4 kg 101.1 kg    CBG: Recent Labs  Lab 06/08/19 1132 06/08/19 1632 06/08/19 2123 06/09/19 0613 06/09/19 1132  GLUCAP 98 83 111* 89 103*    Brief HPI:   Cleopatra Cedaroney Ray Shepler is a 54 y.o. male with history of CAF, CHB s/p PPM, migraines, aortic dissection s/p AVR complicated by CVA with residual valve dysarthria, gait disorder and visual deficits.  He presented on 05/08/2019 with sudden onset of right-sided weakness and left gaze deviation.  CTA head/neck showed thrombosis right innominate artery extending into right CCA and high-grade stenosis with abrupt occlusion of proximal A2, left ACA with reconstitution distally and extensive airspace disease in left apex suspicious for PNA.  CTA chest showed diffuse lung opacity concerning for PNA and dissection or aneurysm.  He underwent cerebral angiogram with attempted thrombectomy of  left A2 occlusion without significant improvement in flow and nonflow limiting dissection of distal cervical segment identified.  Coumadin was subtherapeutic at admission and he was placed on IV heparin.  Neurology felt that stroke was embolic in setting of subtherapeutic INR.    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  Hospital course significant for hypotension requiring Levophed on and off as well as increasing cough and leukocytosis due to aspiration pneumonia.  He was made n.p.o. and started on IV antibiotics.  Therapy ongoing and patient showing ability to follow simple commands, has expressive aphasia with dysarthria and poor safety awareness.  CIR was recommended to functional decline    Hospital Course: Gadge Hermiz was admitted to rehab 05/13/2019 for inpatient therapies to consist of PT, ST and OT at least three hours five days a week. Past admission physiatrist, therapy team and rehab RN have worked together to provide customized collaborative inpatient rehab.  Aspiration pneumonia was treated with Unasyn x7 days.  He was kept n.p.o. with tube feeds ongoing until his ability to handle secretions improved.  Dysphagia treatment has been ongoing and diet was advanced to regular textures with nectar liquids at discharge.  Patient and family have been educated on importance of maintaining chin tuck as well as aspiration precautions to prevent recurrent PNA.  Reactive leukocytosis has resolved with treatment of PNA and diarrhea has resolved with discontinuation of tube feeds. His headaches have been managed on current dose of Topamax and gabapentin.  His blood pressures and hypotension has resolved. His heart rate has been controlled off  amiodarone.  Acute on chronic congestive heart failure has been monitored with daily weights and as this started trending up, Lasix was resumed and titrated up to 40 mg twice daily.    Blood sugars have been monitored on achs basis and these have improved once tube feeds were discontinued. Patient and family have been educated on limiting Carb intake due to pre-diabetes. Serial labs have been monitored during his stay and showed that hypokalemia had resolved therefore Kdur was DC'd on 1/11.  Serial check of BMET shows that renal status and electrolytes are within normal limits.  He is continent of bowel and bladder.  Dr. [name] neuropsychologist was consulted to help with coping strategies to manage reactive depression.  Pharmacy has been assisting in Coumadin management and dosing.  INR at discharge is therapeutic on 7.5 mg a day and appointment set for protime draw at Dr. Martha Clan office on Friday. He has made gains during rehab stay and is at min assist level overall.  He will continue to receive follow up outpatient PT, OT and ST at [facility]   after discharge   Rehab course: During patient's stay in rehab weekly team conferences were held to monitor patient's progress, set goals and discuss barriers to discharge. At admission, patient required Mod assist with mobility and max assist with ADL tasks. He exhibited severe dysarthria with 25% speech intelligibility and displayed impulsivity with decreased problem solving. He was kept NPO with serial MBS to advance his diet as dysphagia improved.  He  has had improvement in activity tolerance, balance, postural control as well as ability to compensate for deficits. He has had improvement in functional use RUE  and RLE as well as improvement in awareness and coordination. He is able to complete ADL tasks with CGA. He is able to perform transfers with CGA and is ambulating 150' with CGA and cues for sequencing/safe use of RW.  He requires mod  verbal cues to adhere to chin tuck and safe swallow strategies. He can require supervision to mod assist with cognitive tasks due to decreased attention and impulsivity. Speech intelligibility has improved to 75-80% with min  cues to utilize speech strategies.  Family education has been completed with sister in law who will provide assistance as needed after discharge.   Discharge disposition: 01-Home or Self Care   Diet: Dysphagia 2, nectar liquids. Needs chin tuck with all liquids.   Special Instructions: 1. Needs supervision with meals and medications to be administered in puree/pudding.  2. Protime draw on 2/05 at 2:30 pm.  3. Recommend repeat BMET in 5-7 days.    Discharge Instructions     Ambulatory referral to Physical Medicine Rehab   Complete by: As directed    1-2 weeks TC appt      Allergies as of 06/09/2019       Reactions   Hydrocodone-acetaminophen Other (See Comments)   PT MOTHER DOES NOT REMEMBER REACTION  Only use in Emergency   Hydromorphone Itching, Rash      Other Other (See Comments)   ALL NARCOTICS-ITCHING, RASH   Oxycodone Other (See Comments)   Unknown reaction   Penicillins Rash   Did it involve swelling of the face/tongue/throat, SOB, or low BP? Unknown Did it involve sudden or severe rash/hives, skin peeling, or any reaction on the inside of your mouth or nose? Unknown Did you need to seek medical attention at a hospital or doctor's office? Unknown When did it last happen?  unknown    If all above answers are "NO", may proceed with cephalosporin use.   Dilaudid [hydromorphone Hcl] Itching, Rash        Medication List     STOP taking these medications    acetaminophen 160 MG/5ML solution Commonly known as: TYLENOL Replaced by: acetaminophen 325 MG tablet   Ampicillin-Sulbactam 3 g in sodium chloride 0.9 % 100 mL   chlorhexidine 0.12 % solution Commonly known as: PERIDEX   Chlorhexidine Gluconate Cloth 2 % Pads   feeding supplement  (JEVITY 1.2 CAL) Liqd   gabapentin 600 MG tablet Commonly known as: NEURONTIN Replaced by: gabapentin 300 MG capsule   heparin 25000-0.45 UT/250ML-% infusion   insulin aspart 100 UNIT/ML injection Commonly known as: novoLOG   oxymetazoline 0.05 % nasal spray Commonly known as: AFRIN   pantoprazole 40 MG injection Commonly known as: PROTONIX Replaced by: pantoprazole 40 MG tablet   sodium chloride 0.9 % infusion       TAKE these medications    acetaminophen 325 MG tablet Commonly known as: TYLENOL Take 1-2 tablets (325-650 mg total) by mouth every 4 (four) hours as needed for mild pain. Replaces: acetaminophen 160 MG/5ML solution   aspirin 81 MG chewable tablet Chew 1 tablet (81 mg total) by mouth daily. What changed: how to take this Notes to patient: You need to purchase this over the counter.    furosemide 40 MG tablet Commonly known as: LASIX Take 1 tablet (40 mg total) by mouth 2 (two) times daily.   gabapentin 300 MG capsule Commonly known as: NEURONTIN Take 1 capsule (300 mg total) by mouth 3 (three) times daily. Replaces: gabapentin 600 MG tablet   levothyroxine 50 MCG tablet Commonly known as: SYNTHROID Take 1 tablet (50 mcg total) by mouth daily at 6 (six) AM.   mouth rinse Liqd solution 15 mLs by Mouth Rinse route 2 times daily at 12 noon and 4 pm.   pantoprazole 40 MG tablet Commonly known as: Protonix Take 1 tablet (40 mg total) by mouth daily. Replaces: pantoprazole 40 MG injection   Resource ThickenUp Clear Powd Thicken all liquids to nectar thick consistency. Do not add ice  rosuvastatin 40 MG tablet Commonly known as: CRESTOR Take 1 tablet (40 mg total) by mouth daily. What changed: how to take this   senna-docusate 8.6-50 MG tablet Commonly known as: Senokot-S Take 1 tablet by mouth at bedtime as needed for mild constipation. What changed: how to take this   topiramate 50 MG tablet Commonly known as: TOPAMAX Take 1 tablet (50 mg  total) by mouth daily. What changed: how to take this   umeclidinium bromide 62.5 MCG/INH Aepb Commonly known as: INCRUSE ELLIPTA Inhale 1 puff into the lungs daily.   warfarin 7.5 MG tablet Commonly known as: COUMADIN Take 1 tablet (7.5 mg total) by mouth daily at 6 PM. What changed: when to take this       Follow-up Information     Marcello Fennel, MD Follow up.   Specialty: Physical Medicine and Rehabilitation Why: Office will call you with follow up appointment Contact information: [address] [place] [address] [phone]         Guadalupe Maple., MD Follow up on 06/12/2019.   Specialty: Family Medicine Why: Appointment at 2:30 pm for coumadin draw. Can set up post hospital follow up also. Contact information: [address] [place] [address] [phone]         [facility]. Call on 06/12/2019.   Why: for stroke follow up if you have not heard from them Contact information: [address]     [address] [place] [address] [phone]           Signed: Jacquelynn Cree 06/10/2019, 4:39 PM Patient was seen, face-face, and physical exam performed by me on day of discharge, less than 30 minutes of total time spent.. Please see progress note from day of discharge as well.  Maryla Morrow, MD, ABPMR

## 2019-06-09 NOTE — Progress Notes (Signed)
[facility] PHYSICAL MEDICINE & REHABILITATION PROGRESS NOTE  Subjective/Complaints: Patient seen sitting up in bed this morning eating breakfast.  He states he slept well overnight.  He is ready for discharge.  ROS: Denies CP, SOB, N/V/D  Objective: Vital Signs: Blood pressure 110/73, pulse 74, temperature 98.5 F (36.9 C), temperature source Oral, resp. rate 18, height 5\' 11"  (1.803 m), weight 101.1 kg, SpO2 95 %. No results found. Recent Labs    06/08/19 0614  WBC 8.1  HGB 12.5*  HCT 39.4  PLT 308   Recent Labs    06/08/19 0614  NA 140  K 3.5  CL 100  CO2 27  GLUCOSE 101*  BUN 14  CREATININE 1.04  CALCIUM 9.5    Physical Exam: BP 110/73 (BP Location: Left Arm)   Pulse 74   Temp 98.5 F (36.9 C) (Oral)   Resp 18   Ht 5\' 11"  (1.803 m)   Wt 101.1 kg   SpO2 95%   BMI 31.09 kg/m  Constitutional: No distress . Vital signs reviewed. HENT: Normocephalic.  Atraumatic. Eyes: EOMI. No discharge. Cardiovascular: No JVD. Respiratory: Normal effort.  No stridor. GI: Non-distended. Skin: Warm and dry.  Intact. Psych: Normal mood.  Normal behavior. Musc: No edema in extremities.  No tenderness in extremities. Neuro: Alert Severe dysarthria, slowly improving Able to follow simple motor commands.  Motor:  RUE: 4-4+/5 proximal distal, unchanged RLE: Hip flexion, knee extension 4 +/5, dorsiflexion 4+/5, unchanged  Assessment/Plan: 1. 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  Physiatrist is providing close team supervision and 24 hour management of active medical problems listed below.  Physiatrist and rehab team continue to assess barriers to discharge/monitor patient progress toward functional and medical goals  Care Tool:  Bathing    Body parts bathed by patient: Right arm, Left lower leg, Left arm, Chest, Face, Abdomen, Front  perineal area, Right upper leg, Left upper leg, Right lower leg, Buttocks   Body parts bathed by helper: Buttocks     Bathing assist Assist Level: Contact Guard/Touching assist     Upper Body Dressing/Undressing Upper body dressing   What is the patient wearing?: Pull over shirt    Upper body assist Assist Level: Supervision/Verbal cueing    Lower Body Dressing/Undressing Lower body dressing      What is the patient wearing?: Underwear/pull up, Pants     Lower body assist Assist for lower body dressing: Contact Guard/Touching assist     Toileting Toileting    Toileting assist Assist for toileting: Contact Guard/Touching assist     Transfers Chair/bed transfer  Transfers assist     Chair/bed transfer assist level: Contact Guard/Touching assist     Locomotion Ambulation   Ambulation assist   Ambulation activity did not occur: Safety/medical concerns(fatigue, LE weakness, restricted to threapy in room due to IV fluids)  Assist level: Contact Guard/Touching assist Assistive device: Walker-rolling Max distance: 175ft   Walk 10 feet activity   Assist  Walk 10 feet activity did not occur: Safety/medical concerns(fatigue, LE weakness, restricted to threapy in room due to IV fluids)  Assist level: Contact Guard/Touching assist Assistive device: Walker-rolling   Walk 50 feet activity   Assist Walk 50 feet with 2 turns activity did not occur: Safety/medical concerns(fatigue, LE weakness, restricted to threapy in room due to IV fluids)  Assist level: Contact Guard/Touching assist Assistive device: Walker-rolling  Walk 150 feet activity   Assist Walk 150 feet activity did not occur: Safety/medical concerns(fatigue, LE weakness, restricted to threapy in room due to IV fluids)  Assist level: Contact Guard/Touching assist Assistive device: Walker-rolling    Walk 10 feet on uneven surface  activity   Assist Walk 10 feet on uneven surfaces activity  did not occur: Safety/medical concerns(fatigue, LE weakness, restricted to threapy in room due to IV fluids)   Assist level: Contact Guard/Touching assist Assistive device: Photographer Will patient use wheelchair at discharge?: Yes Type of Wheelchair: Manual Wheelchair activity did not occur: Safety/medical concerns(fatigue, R hemi, restricted to threapy in room due to IV fluids)  Wheelchair assist level: Independent Max wheelchair distance: 123ft    Wheelchair 50 feet with 2 turns activity    Assist    Wheelchair 50 feet with 2 turns activity did not occur: Safety/medical concerns(fatigue, R hemi, restricted to threapy in room due to IV fluids)   Assist Level: Independent   Wheelchair 150 feet activity     Assist Wheelchair 150 feet activity did not occur: Safety/medical concerns(fatigue, R hemi, restricted to threapy in room due to IV fluids)   Assist Level: Independent    Medical Problem List and Plan: 1.  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  DC today  Will see patient for transitional care management in 1-2 weeks post-discharge 2.  Antithrombotics: -DVT/anticoagulation:  Pharmaceutical: Coumadin goal 2.5-3.5.   INR therapeutic 2/1, no labs for today  -antiplatelet therapy: on ASA 3. Pain Management: tylenol prn 4. Mood: LCSW to follow for evaluation and support when appropriate.              -antipsychotic agents: N/A 5. Neuropsych: This patient is not fully capable of making decisions on his own behalf. 6. Skin/Wound Care: Routine pressure relief measures.  7. Fluids/Electrolytes/Nutrition: Monitor electrolytes 8. Aspiration PNA: Completed 7 day course of Unasyn on 1/12. Needs to be upright with pulmonary toilet.    Weaned supplemental oxygen as tolerated 9. CAF/CHB s/p PPM:   Monitor HR Off amiodarone and Entresto due to  issues with hypotension.   Controlled on 2/2 11 COPD/OSA: Duonebs and incruse resumed. Encourage IS and keep HOB>30 degrees.   Communicated medication adjustments with respiratory therapy 12. Depression/anxiety disorder: Emotional support 13. H/o migraines: Has been managed on Topamax and gabapentin.  14. Hypokalemia: Resolved  Kdur DC'd on 1/11  Potassium 3.5 on 2/1  Continue to monitor labs 15.  Hyperglycemia-secondary to tube feeds-now off tube feeds  Relatively controlled on 2/2  Continue SSI as needed 16.  Leukocytosis: Resolved  WBCs 9.0 on 1/13  Afebrile  Continue to monitor 17.  Acute blood loss anemia  Hemoglobin 12.5 on 2/2  Continue to monitor 18.  Hypoalbuminemia  Adjust with tube feeds 19.  Acute on chronic combined congestive heart failure  EF 25-30%  Daily weights  No sign of fluid overload at this time, continue to monitor Filed Weights   06/04/19 0351 06/05/19 0459 06/09/19 0500  Weight: 101.5 kg 101.4 kg 101.1 kg   Lasix 20 daily started on 1/19, increased on 1/20, increased to 40 twice daily  Free water flushes decreased on 1/22---hold on 1/23  -pt is on 40mg  am and 20mg  pm lasix at home  Stable on 2/2 20.  Loose stools  Discussed changing tube feeds with dietary  KUB reviewed, unremarkable  Improved after completion of  abx 21.  Post stroke dysphagia  Eating variable, but overall moderate on 2/1  Advanced D1 pudding, advanced to D2 nectar  Liquid supplement DC'd, DC NG on 1/29   Maintaining oral hydration/nutrition, follow-up labs as outpatient     LOS: 27 days A FACE TO FACE EVALUATION WAS PERFORMED  Jon [name] Lorie Phenix 06/09/2019, 8:24 AM

## 2019-06-11 ENCOUNTER — Telehealth: Payer: Self-pay

## 2019-06-11 NOTE — Telephone Encounter (Signed)
  TRANSITIONAL CARE CALL attempted, no answer, left message to return call.  Appointment Date and Time: 06-22-2019 / 1:00pm With: Dr Carlis Abbott first then back to Dr. Allena Katz

## 2019-06-22 ENCOUNTER — Inpatient Hospital Stay: Payer: Medicaid Other | Admitting: Physical Medicine and Rehabilitation

## 2019-06-22 ENCOUNTER — Encounter: Payer: Medicaid Other | Admitting: Physical Medicine and Rehabilitation

## 2019-06-29 ENCOUNTER — Encounter: Payer: Self-pay | Admitting: Physical Medicine and Rehabilitation

## 2019-06-29 ENCOUNTER — Encounter
Payer: Medicaid Other | Attending: Physical Medicine and Rehabilitation | Admitting: Physical Medicine and Rehabilitation

## 2019-06-29 ENCOUNTER — Other Ambulatory Visit: Payer: Self-pay

## 2019-06-29 VITALS — BP 123/80 | HR 64 | Temp 97.9°F | Ht 70.0 in | Wt 220.0 lb

## 2019-06-29 DIAGNOSIS — R471 Dysarthria and anarthria: Secondary | ICD-10-CM | POA: Diagnosis present

## 2019-06-29 DIAGNOSIS — I69391 Dysphagia following cerebral infarction: Secondary | ICD-10-CM | POA: Insufficient documentation

## 2019-06-29 DIAGNOSIS — Z6831 Body mass index (BMI) 31.0-31.9, adult: Secondary | ICD-10-CM | POA: Diagnosis present

## 2019-06-29 DIAGNOSIS — E669 Obesity, unspecified: Secondary | ICD-10-CM | POA: Diagnosis present

## 2019-06-29 DIAGNOSIS — I639 Cerebral infarction, unspecified: Secondary | ICD-10-CM | POA: Insufficient documentation

## 2019-06-29 MED ORDER — TIZANIDINE HCL 2 MG PO CAPS: 2.0000 mg | ORAL_CAPSULE | Freq: Three times a day (TID) | ORAL | 1 refills | Status: DC

## 2019-06-29 MED ORDER — GABAPENTIN 300 MG PO CAPS: 300.0000 mg | ORAL_CAPSULE | Freq: Three times a day (TID) | ORAL | 0 refills | Status: AC

## 2019-06-29 MED ORDER — TIZANIDINE HCL 2 MG PO TABS: 2.0000 mg | ORAL_TABLET | Freq: Three times a day (TID) | ORAL | 1 refills | Status: AC | PRN

## 2019-06-29 MED ORDER — FUROSEMIDE 40 MG PO TABS: 40.0000 mg | ORAL_TABLET | Freq: Two times a day (BID) | ORAL | 0 refills | Status: DC

## 2019-06-29 MED ORDER — LEVOTHYROXINE SODIUM 50 MCG PO TABS: 50.0000 ug | ORAL_TABLET | Freq: Every day | ORAL | 0 refills | Status: AC

## 2019-06-29 NOTE — Progress Notes (Signed)
Subjective:    Patient ID: Jon [name], male    DOB: August 23, 1965, 54 y.o.   MRN: [id number]  HPI  Jon [name] is a 54 year old man who presents for transitional care follow-up after CIR admission for L MCA stroke.  Mobility: Has has been receiving outpatient PT, OT, and SLP and mobility and speech are much improved. He has been ambulating with a RW at all times; his mother who accompanies him makes sure he always uses it. Denies falls.  Medications: Reviewed medications and patient is taking them as prescribed. His mother has brought all pill bottles to this visit. He needs refills on levothyroxine, lasix, and gabapentin. He does not need additional refills at this time and has PCP and neurology follow-up.   Pain: He has been having a lot of tightness in the muscles of his upper back and neck and this has been preventing him from sleeping well. His mother says as a result, he then sleeps too much during the daytime. Patient would be interested in starting a medication to reduce his muscle spasms.   QOL: Urinating well, moving bowels regularly. Pain is otherwise well controlled. For joy he watches TV during the day. He also performs his HEP regularly and is adamant that he wants to prevent another stroke.  Diet: Is not following SLP diet recommendations and mostly eating processed foods such as pizza and chips.   Pain Inventory Average Pain 3 Pain Right Now 0 My pain is aching  In the last 24 hours, has pain interfered with the following? General activity 0 Relation with others 0 Enjoyment of life 0 What TIME of day is your pain at its worst? night Sleep (in general) Poor  Pain is worse with: unsure Pain improves with: na Relief from Meds: 0  Mobility use a walker ability to climb steps?  yes do you drive?  no  Function disabled: date disabled . I need assistance with the following:  feeding, dressing, bathing, toileting, meal prep, household duties and  shopping  Neuro/Psych tremor trouble walking confusion anxiety  Prior Studies Any changes since last visit?  no  Physicians involved in your care Any changes since last visit?  no   Family History  Problem Relation Age of Onset  . Lung disease Mother   . Diabetes Father   . Peripheral Artery Disease Father   . Hypertension Father   . CAD Father   . Heart disease Father   . Stroke Father   . Cancer Maternal Grandfather   . Heart disease Paternal Grandfather    Social History   Socioeconomic History  . Marital status: Married    Spouse name: Not on file  . Number of children: 2  . Years of education: Not on file  . Highest education level: Not on file  Occupational History  . Occupation: disabled  . Occupation: Dealer  Tobacco Use  . Smoking status: Current Every Day Smoker    Packs/day: 2.50    Types: Cigarettes    Start date: 04/21/2012    Last attempt to quit: 04/21/2012    Years since quitting: 7.1  . Smokeless tobacco: Never Used  Substance and Sexual Activity  . Alcohol use: Yes  . Drug use: No    Comment: previous crack cocaine "quit 4 years ago"  . Sexual activity: Yes  Other Topics Concern  . Not on file  Social History Narrative  . Not on file   Social Determinants of Health   Financial Resource  Strain:   . Difficulty of Paying Living Expenses: Not on file  Food Insecurity:   . Worried About Programme researcher, broadcasting/film/video in the Last Year: Not on file  . Ran Out of Food in the Last Year: Not on file  Transportation Needs:   . Lack of Transportation (Medical): Not on file  . Lack of Transportation (Non-Medical): Not on file  Physical Activity:   . Days of Exercise per Week: Not on file  . Minutes of Exercise per Session: Not on file  Stress:   . Feeling of Stress : Not on file  Social Connections:   . Frequency of Communication with Friends and Family: Not on file  . Frequency of Social Gatherings with Friends and Family: Not on file  . Attends  Religious Services: Not on file  . Active Member of Clubs or Organizations: Not on file  . Attends Banker Meetings: Not on file  . Marital Status: Not on file   Past Surgical History:  Procedure Laterality Date  . APPLICATION OF WOUND VAC  06/11/2012   Procedure: APPLICATION OF WOUND VAC;  Surgeon: [name], MD;  Location: [facility] OR;  Service: Vascular;  Laterality: Right;  . CARDIAC CATHETERIZATION    . CHOLECYSTECTOMY    . CORONARY ARTERY BYPASS GRAFT  04/22/2012   Procedure: CORONARY ARTERY BYPASS GRAFTING (CABG);  Surgeon: [name], MD;  Location: [facility] OR;  Service: Open Heart Surgery;  Laterality: N/A;  times one with right greater saphenous vein graft  . I & D EXTREMITY  06/11/2012   Procedure: IRRIGATION AND DEBRIDEMENT EXTREMITY;  Surgeon: [name], MD;  Location: [facility] OR;  Service: Vascular;  Laterality: Right;  . INTRAOPERATIVE TRANSESOPHAGEAL ECHOCARDIOGRAM  04/22/2012   Procedure: INTRAOPERATIVE TRANSESOPHAGEAL ECHOCARDIOGRAM;  Surgeon: [name], MD;  Location: [facility] OR;  Service: Open Heart Surgery;  Laterality: N/A;  . IR CT HEAD LTD  05/08/2019  . IR PERCUTANEOUS ART THROMBECTOMY/INFUSION INTRACRANIAL INC DIAG ANGIO  05/08/2019  . IR US GUIDE VASC ACCESS RIGHT  05/08/2019  . PERMANENT PACEMAKER INSERTION  10/2018   [employer] Jude's dual PPM  . RADIOLOGY WITH ANESTHESIA N/A 05/08/2019   Procedure: IR WITH ANESTHESIA;  Surgeon: Radiologist, Medication, MD;  Location: [facility] OR;  Service: Radiology;  Laterality: N/A;  . ROTATOR CUFF REPAIR     12 years ago, Left  . THORACIC AORTIC ANEURYSM REPAIR  04/22/2012   Procedure: THORACIC ASCENDING ANEURYSM REPAIR (AAA);  Surgeon: [name], MD;  Location: [facility] OR;  Service: Open Heart Surgery;  Laterality: N/A;   Past Medical History:  Diagnosis Date  . Aneurysm of thoracic aorta (HCC) 05/06/2012   IMO routine update IMO routine update  . Anxiety    sees Dr. Renae Fickle  . Aortic aneurysm and  dissection (HCC) 05/06/2012  . Aortic stenosis    mechanical AVR 04/22/12  . Aortic valve disorder 05/06/2012  . 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 Bicuspid aortic valve   . Cerebral infarction (HCC) 05/11/2012  . Cerebral thrombosis with cerebral infarction (HCC) 07/14/2013  . Cholelithiasis 12/20/2016  . Current use of long term anticoagulation 12/20/2016  . CVA (cerebral infarction)    occipital CVA 12/13  . Dissecting aortic aneurysm (HCC) 05/06/2012  . Dysarthria 09/10/2017  . Dysrhythmia    sees Dr. Geronimo Running, [facility] cardiology in [place]  . Generalized constriction of visual field 10/15/2012   IMOUPDATE IMOUPDATE  . 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  GERD (gastroesophageal reflux disease)   . HCAP (healthcare-associated pneumonia) 05/06/2012  . Headache(784.0)    "due to vision problem"  . History of CVA (cerebrovascular accident) 05/07/2012  . Hypothyroidism   . Idiopathic peripheral neuropathy 07/14/2013  . Late effects of CVA (cerebrovascular accident) 08/08/2015  . Migraine   . Nontraumatic multiple localized intracerebral hemorrhages (HCC) 10/16/2017  . Paroxysmal atrial fibrillation (HCC) 08/08/2015   On warfarin On warfarin  . Presence of prosthetic heart valve 08/08/2015  . Shortness of breath   . Stroke (HCC)    x3  . Stroke, embolic (HCC) 05/07/2012  . Syncope 05/06/2012  . Thoracic aortic aneurysm (HCC)    replacement aortic root 04/22/12  . VBI (vertebrobasilar insufficiency) 09/10/2017   There were no vitals taken for this visit.  Opioid Risk Score:   Fall Risk Score:  `1  Depression screen PHQ 2/9  No flowsheet data found.   Review of Systems  Constitutional: Negative.   HENT: Negative.   Eyes: Negative.   Respiratory: Positive for cough and shortness of breath.   Cardiovascular: Negative.   Gastrointestinal: Negative.   Endocrine: Negative.   Genitourinary: Negative.   Musculoskeletal: Positive for  gait problem.  Skin: Negative.   Allergic/Immunologic: Negative.   Neurological: Positive for tremors.  Hematological: Negative.   Psychiatric/Behavioral: Positive for confusion. The patient is nervous/anxious.   All other systems reviewed and are negative.      Objective:   Physical Exam Constitutional: No distress . Vital signs reviewed. HENT: Normocephalic.  Atraumatic. Eyes: EOMI. No discharge. Cardiovascular: No JVD. Respiratory: Normal effort.  No stridor. GI: Non-distended. Skin: Warm and dry.  Intact. Psych: Normal mood.  Normal behavior. Musc: No edema in extremities.  No tenderness in extremities. Neuro: Alert and oriented.  Severe dysarthria, much improved and it is much easier to understand him! Able to follow simple motor commands.  Motor:  RUE: 4+/5 proximal distal, unchanged RLE: Hip flexion, knee extension 4+/5, dorsiflexion 4+/5, unchanged Walking with RW ModI; stable       Assessment & Plan:  . 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             -Continue home PT, OT, SLP. Mr. Hersh has been doing a great job engaging in his HEP on his own and is doing very well ambulating with RW and is with much clearer speech! 2. Antithrombotics: -DVT/anticoagulation: Pharmaceutical: Coumadin goal 2.5-3.5.               I have personally reviewed most recent INR which is therapeutic. He has follow-up labs tomorrow.  3. Pain Management: He is having muscle spasms in his upper back and neck muscles and sleeping poorly at night. I will prescribe 2mg  Tizanidine HS prn to help with both of these issues.  4. Hypokalemia: Resolved. Has lab recheck tomorrow. Advised him of K+ rich foods which would be a great addition to his mostly processed diet.  5.  Post stroke dysphagia             Reminded regarding D2 recommendations and that it is important to follow this to prevent the  risk of aspiration pneumonia. Continue SLP follow-up.   Patient has follow-up cardiology and neurology appointments scheduled. He has established care with PCP and I have filled needed prescriptions prior to his first appointment with them. I answered all questions, including explaining to him about his angiogram and providing stroke prevention education. He  is progressing very well with therapy. Return to clinic as needed.

## 2019-06-29 NOTE — Telephone Encounter (Signed)
Pharmacy called, insurance will NOT pay for capsule version of this medication but will the tablet version.  Reordered for tablets instead of capsules using clinical protocol.

## 2019-07-10 ENCOUNTER — Other Ambulatory Visit: Payer: Self-pay | Admitting: Physical Medicine and Rehabilitation

## 2019-07-14 ENCOUNTER — Encounter: Payer: Self-pay | Admitting: Adult Health

## 2019-07-14 ENCOUNTER — Ambulatory Visit: Payer: Medicaid Other | Admitting: Adult Health

## 2019-07-14 ENCOUNTER — Other Ambulatory Visit: Payer: Self-pay

## 2019-07-14 VITALS — BP 118/84 | HR 66 | Temp 96.6°F | Ht 70.5 in | Wt 223.0 lb

## 2019-07-14 DIAGNOSIS — E782 Mixed hyperlipidemia: Secondary | ICD-10-CM | POA: Diagnosis not present

## 2019-07-14 DIAGNOSIS — I480 Paroxysmal atrial fibrillation: Secondary | ICD-10-CM

## 2019-07-14 DIAGNOSIS — E1142 Type 2 diabetes mellitus with diabetic polyneuropathy: Secondary | ICD-10-CM

## 2019-07-14 DIAGNOSIS — I10 Essential (primary) hypertension: Secondary | ICD-10-CM | POA: Diagnosis not present

## 2019-07-14 DIAGNOSIS — I639 Cerebral infarction, unspecified: Secondary | ICD-10-CM

## 2019-07-14 NOTE — Progress Notes (Signed)
[facility] [address] [place]. [place] [address] [phone]       HOSPITAL FOLLOW UP NOTE  [name] Date of Birth:  02-05-1966 Medical Record Number:  [id number]   Reason for Referral:  hospital stroke follow up    CHIEF COMPLAINT:  Chief Complaint  Patient presents with  . Follow-up    Tx RM. with mother. states that the back of his head hurts and his neck hurts as well.    HPI: Jon Guertin Hendersonis being seen today for in office hospital follow-up regarding left ACA/MCA infarcts with unsuccessful IR secondary to mechanical valve, PAF and low EF in setting of subtherapeutic anticoagulation on 05/08/2019.  History obtained from patient, mother and chart review. Reviewed all radiology images and labs personally.  Mr.Jon Ray Hendersonis a 54 y.o.malewith history of prior stroke with residual dysarthria (ambulates with a walker), anxiety / depression, DM, PAF,ongoing tobacco use, CHB s/p PPM, migraines, CAD / CABG, type aortic dissection in 2014 with repair that was complicated and involved a brachiocephalic graft to the left carotid as the dissection extended into the brachiocephalic and left carotid, aortic stenosis status post mechanical aortic valve; on 2L [place] 02 @ baseline presented to the ED as a code stroke on 05/08/2019 for sudden onset of leftward gaze, aphasia, and right-sided flaccid paralysis.  Evaluated by stroke team and Dr. Leonie Man with stroke work-up revealing left ACA/MCA infarcts with attempted IR infarcts embolic due to mechanical valve, PAF and low EF in setting of subtherapeutic anticoagulation.  Prior to admission, he was admitted to [facility] for weakness of RLE treated for pneumonia and since discharge, only receiving Lovenox and not Coumadin. CTA head/neck showed thrombosis right innominate artery extending into right CCA and high-grade stenosis with abrupt occlusion of proximal A2, left ACA with reconstitution distally and  extensive airspace disease in left apex suspicious for PNA.  CTA chest showed diffuse lung opacity concerning for PNA and dissection or aneurysm.  He underwent cerebral angiogram with attempted thrombectomy of left A2 occlusion without significant improvement in flow and nonflow limiting dissection of distal cervical segment identified. 2D echo showed an EF of 25 to 30% without cardiac source of embolus identified.  History of A. fib previously on Lovenox and warfarin but not taking warfarin and missed last dose of Lovenox.  Last INR 1.1.  Recommended to restart warfarin at discharge with INR goal 2.5-3.5.  HTN with episodes of hypotension and recommended long-term BP goal normotensive range.  LDL 69 and continued home dose statin.  Controlled DM 2 with A1c 6.9.  Concern for aspiration pneumonia with leukocytosis treated with antibiotics during admission.  Other stroke risk factors include current tobacco use, EtOH use, obesity, prior history of stroke 05/2012, family history of stroke, CAD s/p CABG, mechanical aortic valve on anticoagulation and CHF.  Residual stroke deficits of expressive aphasia with dysarthria, dysphagia, right hemiparesis and poor safety awareness and discharged to CIR for ongoing therapy needs.  [name] is a 54 year old male who is being seen today for hospital follow-up accompanied by his mother.  Residual stroke deficits of expressive aphasia with dysarthria, dysphagia and right hemiparesis.  He continues to receive outpatient therapies with ongoing improvement. Currently ambulating with rolling walker and denies any recent falls.  Denies residual swallowing difficulties and per patient, currently on a regular diet without difficulty.  Per mother, he has been having posterior headaches and neck pain which has been stable since his stroke without worsening - patient  endorses not bothersome. Was recently started on Zanaflex by PMR. Continues on warfarin and aspirin 81 mg daily without  bleeding or bruising.  INR levels stable per patient monitored by PCP/cardiology. Continues on Crestor without myalgias.  Blood pressure today 118/84. No further concerns at this time.     ROS:   14 system review of systems performed and negative with exception of speech impairment, joint pain, weakness, anxiety, depression, insomnia  PMH:  Past Medical History:  Diagnosis Date  . Aneurysm of thoracic aorta (HCC) 05/06/2012   IMO routine update IMO routine update  . Anxiety    sees Dr. Renae Fickle  . Aortic aneurysm and dissection (HCC) 05/06/2012  . Aortic stenosis    mechanical AVR 04/22/12  . Aortic valve disorder 05/06/2012  . 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 Bicuspid aortic valve   . Cerebral infarction (HCC) 05/11/2012  . Cerebral thrombosis with cerebral infarction (HCC) 07/14/2013  . Cholelithiasis 12/20/2016  . Current use of long term anticoagulation 12/20/2016  . CVA (cerebral infarction)    occipital CVA 12/13  . Dissecting aortic aneurysm (HCC) 05/06/2012  . Dysarthria 09/10/2017  . Dysrhythmia    sees Dr. Geronimo Running, [facility] cardiology in [place]  . Generalized constriction of visual field 10/15/2012   IMOUPDATE IMOUPDATE  . 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 GERD (gastroesophageal reflux disease)   . HCAP (healthcare-associated pneumonia) 05/06/2012  . Headache(784.0)    "due to vision problem"  . History of CVA (cerebrovascular accident) 05/07/2012  . Hypothyroidism   . Idiopathic peripheral neuropathy 07/14/2013  . Late effects of CVA (cerebrovascular accident) 08/08/2015  . Migraine   . Nontraumatic multiple localized intracerebral hemorrhages (HCC) 10/16/2017  . Paroxysmal atrial fibrillation (HCC) 08/08/2015   On warfarin On warfarin  . Presence of prosthetic heart valve 08/08/2015  . Shortness of breath   . Stroke (HCC)    x3  . Stroke, embolic (HCC) 05/07/2012  . Syncope 05/06/2012  . Thoracic aortic aneurysm  (HCC)    replacement aortic root 04/22/12  . VBI (vertebrobasilar insufficiency) 09/10/2017    PSH:  Past Surgical History:  Procedure Laterality Date  . APPLICATION OF WOUND VAC  06/11/2012   Procedure: APPLICATION OF WOUND VAC;  Surgeon: [name], MD;  Location: [facility] OR;  Service: Vascular;  Laterality: Right;  . CARDIAC CATHETERIZATION    . CHOLECYSTECTOMY    . CORONARY ARTERY BYPASS GRAFT  04/22/2012   Procedure: CORONARY ARTERY BYPASS GRAFTING (CABG);  Surgeon: [name], MD;  Location: [facility] OR;  Service: Open Heart Surgery;  Laterality: N/A;  times one with right greater saphenous vein graft  . I & D EXTREMITY  06/11/2012   Procedure: IRRIGATION AND DEBRIDEMENT EXTREMITY;  Surgeon: [name], MD;  Location: [facility] OR;  Service: Vascular;  Laterality: Right;  . INTRAOPERATIVE TRANSESOPHAGEAL ECHOCARDIOGRAM  04/22/2012   Procedure: INTRAOPERATIVE TRANSESOPHAGEAL ECHOCARDIOGRAM;  Surgeon: [name], MD;  Location: [facility] OR;  Service: Open Heart Surgery;  Laterality: N/A;  . IR CT HEAD LTD  05/08/2019  . IR PERCUTANEOUS ART THROMBECTOMY/INFUSION INTRACRANIAL INC DIAG ANGIO  05/08/2019  . IR US GUIDE VASC ACCESS RIGHT  05/08/2019  . PERMANENT PACEMAKER INSERTION  10/2018   St. Jude's dual PPM  . RADIOLOGY WITH ANESTHESIA N/A 05/08/2019   Procedure: IR WITH ANESTHESIA;  Surgeon: Radiologist, Medication, MD;  Location: [facility] OR;  Service: Radiology;  Laterality: N/A;  . ROTATOR CUFF REPAIR     12  years ago, Left  . THORACIC AORTIC ANEURYSM REPAIR  04/22/2012   Procedure: THORACIC ASCENDING ANEURYSM REPAIR (AAA);  Surgeon: [name], MD;  Location: [facility] OR;  Service: Open Heart Surgery;  Laterality: N/A;    Social History:  Social History   Socioeconomic History  . Marital status: Married    Spouse name: Not on file  . Number of children: 2  . Years of education: Not on file  . Highest education level: Not on file  Occupational History  . Occupation:  disabled  . Occupation: Curator  Tobacco Use  . Smoking status: Current Every Day Smoker    Packs/day: 2.50    Types: Cigarettes    Start date: 04/21/2012    Last attempt to quit: 04/21/2012    Years since quitting: 7.2  . Smokeless tobacco: Never Used  Substance and Sexual Activity  . Alcohol use: Yes  . Drug use: No    Comment: previous crack cocaine "quit 4 years ago"  . Sexual activity: Yes  Other Topics Concern  . Not on file  Social History Narrative  . Not on file   Social Determinants of Health   Financial Resource Strain:   . Difficulty of Paying Living Expenses: Not on file  Food Insecurity:   . Worried About Programme researcher, broadcasting/film/video in the Last Year: Not on file  . Ran Out of Food in the Last Year: Not on file  Transportation Needs:   . Lack of Transportation (Medical): Not on file  . Lack of Transportation (Non-Medical): Not on file  Physical Activity:   . Days of Exercise per Week: Not on file  . Minutes of Exercise per Session: Not on file  Stress:   . Feeling of Stress : Not on file  Social Connections:   . Frequency of Communication with Friends and Family: Not on file  . Frequency of Social Gatherings with Friends and Family: Not on file  . Attends Religious Services: Not on file  . Active Member of Clubs or Organizations: Not on file  . Attends Banker Meetings: Not on file  . Marital Status: Not on file  Intimate Partner Violence:   . Fear of Current or Ex-Partner: Not on file  . Emotionally Abused: Not on file  . Physically Abused: Not on file  . Sexually Abused: Not on file    Family History:  Family History  Problem Relation Age of Onset  . Lung disease Mother   . Diabetes Father   . Peripheral Artery Disease Father   . Hypertension Father   . CAD Father   . Heart disease Father   . Stroke Father   . Cancer Maternal Grandfather   . Heart disease Paternal Grandfather     Medications:   Current Outpatient Medications on File  Prior to Visit  Medication Sig Dispense Refill  . acetaminophen (TYLENOL) 325 MG tablet Take 1-2 tablets (325-650 mg total) by mouth every 4 (four) hours as needed for mild pain.    Marland Kitchen aspirin 81 MG chewable tablet Chew 1 tablet (81 mg total) by mouth daily. 100 tablet 1  . furosemide (LASIX) 40 MG tablet Take 1 tablet (40 mg total) by mouth 2 (two) times daily. 60 tablet 0  . gabapentin (NEURONTIN) 300 MG capsule Take 1 capsule (300 mg total) by mouth 3 (three) times daily. 90 capsule 0  . levothyroxine (SYNTHROID) 50 MCG tablet Take 1 tablet (50 mcg total) by mouth daily at 6 (six)  AM. 30 tablet 0  . mouth rinse LIQD solution 15 mLs by Mouth Rinse route 2 times daily at 12 noon and 4 pm.  0  . pantoprazole (PROTONIX) 40 MG tablet Take 1 tablet (40 mg total) by mouth daily. 30 tablet 0  . potassium chloride (KLOR-CON) 10 MEQ tablet Take 10 mEq by mouth daily.    . rosuvastatin (CRESTOR) 40 MG tablet Take 1 tablet (40 mg total) by mouth daily. 30 tablet 0  . tiZANidine (ZANAFLEX) 2 MG tablet Take 1 tablet (2 mg total) by mouth 3 (three) times daily as needed for muscle spasms. 30 tablet 1  . topiramate (TOPAMAX) 50 MG tablet Take 1 tablet (50 mg total) by mouth daily. 30 tablet 0  . umeclidinium bromide (INCRUSE ELLIPTA) 62.5 MCG/INH AEPB Inhale 1 puff into the lungs daily. 30 each 0  . warfarin (COUMADIN) 7.5 MG tablet Take 1 tablet (7.5 mg total) by mouth daily at 6 PM. (Patient taking differently: Take 5 mg by mouth daily at 6 PM. ) 30 tablet 0   No current facility-administered medications on file prior to visit.    Allergies:   Allergies  Allergen Reactions  . Hydrocodone-Acetaminophen Other (See Comments)    PT MOTHER DOES NOT REMEMBER REACTION  Only use in Emergency   . Hydromorphone Itching and Rash       . Other Other (See Comments)    ALL NARCOTICS-ITCHING, RASH   . Oxycodone Other (See Comments)    Unknown reaction  . Penicillins Rash    Did it involve swelling of the  face/tongue/throat, SOB, or low BP? Unknown Did it involve sudden or severe rash/hives, skin peeling, or any reaction on the inside of your mouth or nose? Unknown Did you need to seek medical attention at a hospital or doctor's office? Unknown When did it last happen?unknown If all above answers are "NO", may proceed with cephalosporin use.  . Dilaudid [Hydromorphone Hcl] Itching and Rash     Physical Exam  Vitals:   07/14/19 0914  BP: 118/84  Pulse: 66  Temp: (!) 96.6 F (35.9 C)  SpO2: 93%  Weight: 223 lb (101.2 kg)  Height: 5' 10.5" (1.791 m)   Body mass index is 31.54 kg/m. No exam data present  No flowsheet data found.   General: well developed, well nourished,  pleasantly anxious middle-age Caucasian male, seated, in no evident distress Head: head normocephalic and atraumatic.   Neck: supple with no carotid or supraclavicular bruits Cardiovascular: regular rate and rhythm, no murmurs Musculoskeletal: no deformity Skin:  no rash/petichiae Vascular:  Normal pulses all extremities   Neurologic Exam Mental Status: Awake and fully alert.   Expressive aphasia with moderate dysarthria.  Oriented to place and time. Recent and remote memory intact. Attention span, concentration and fund of knowledge appropriate. Mood and affect appropriate.  Cranial Nerves: Fundoscopic exam reveals sharp disc margins. Pupils equal, briskly reactive to light. Extraocular movements full without nystagmus. Visual fields full to confrontation. Hearing intact. Facial sensation intact. Right facial droop  Motor: Normal bulk and tone. Normal strength in all tested extremity muscles except mild right hand weakness and mild right hip flexor weakness. Sensory.: intact to touch , pinprick , position and vibratory sensation.  Coordination: Rapid alternating movements normal in all extremities except mildly decreased right hand. Finger-to-nose mild right hand ataxia and heel-to-shin performed accurately  bilaterally. Gait and Station: Arises from chair without difficulty. Stance is normal. Gait demonstrates  hemiplegic gait with mild imbalance and  use of rolling walker Reflexes: 1+ and symmetric. Toes downgoing.      Diagnostic Data (Labs, Imaging, Testing)   Code Stroke CT Head - 2.1 x 1.1 cm focus of abnormal hypodensity within the left aspect of the callosal body and possibly also involving the pericallosal gyrus on the left.  CT head- 05/10/2019 -Non flow limiting dissection was identified in the distal cervical segment.  CTA Head -The right ICA is markedly diminutive through the siphon region with sites of high-grade stenosis. Mixed plaque also results in mild/moderate stenosis of the supraclinoid right ICA. Abrupt occlusion of the proximal A2 left anterior cerebral artery. There appears to be some reconstitution of the left ACA distally.   CTA Neck - there is a greater stenosis within the proximal left common carotid artery than originally stated - likely moderate/severe. Thrombosis of the right innominate artery. Thrombus extends into the proximal right common carotid artery with high-grade stenosis. Sites of high-grade stenosis within the siphon region.   CT Perfusion - 125 mL region of hypoperfused parenchyma predominantly within the left ACA and ACA/MCA watershed territory, as well as right ACA/MCA watershed territory.  IR Thrombectomy Attempt- Left A2 occlusion, remained occluded at the conclusion  MRI head -L MCA/ACA infarcts   2D Echo - EF 25 to 30%.No cardiac source of emboli identified.   S[place] Corona Virus 2 - negative  LDL - 67  HgbA1c- 6.9   ASSESSMENT: Jon [name] is a 54 y.o. year old male presented with leftward gaze, aphasia and right-sided flaccid paralysis on 05/08/2019 with stroke work-up revealing left ACA/MCA infarcts with unsuccessful IR, infarcts embolic secondary to mechanical valve, PAF, and low EF in setting of subtherapeutic  anticoagulation. Vascular risk factors include prior stroke with residual dysarthria, DM, HTN, HLD, PAF, CAD/CABG, CHB s/p PPM, aortic stenosis s/p mechanical heart valve, aortic dissection 2014 and current tobacco use. Residual deficit of mild right hemiparesis, expressive aphasia and dysarthria.     PLAN:  1. Left ACA/MCA stroke: Continue aspirin 81 mg daily and warfarin daily  and Crestor for secondary stroke prevention. Maintain strict control of hypertension with blood pressure goal below 130/90, diabetes with hemoglobin A1c goal below 6.5% and cholesterol with LDL cholesterol (bad cholesterol) goal below 70 mg/dL.  I also advised the patient to eat a healthy diet with plenty of whole grains, cereals, fruits and vegetables, exercise regularly with at least 30 minutes of continuous activity daily and maintain ideal body weight. 2. HTN: Advised to continue current treatment regimen.  Today's BP stable.  Advised to continue to monitor at home along with continued follow-up with PCP for management 3. HLD: Advised to continue current treatment regimen along with continued follow-up with PCP for future prescribing and monitoring of lipid panel 4. DMII: Advised to continue to monitor glucose levels at home along with continued follow-up with PCP for management and monitoring 5. PAF, CHF and CAD: Ongoing use of warfarin and ongoing follow-up with cardiology 6. Residual deficit, post stroke: continue outpatient therapy for ongoing hopeful improvement. Continue to follow with PMR for ongoing monitoring      Follow up in 3 months or call earlier if needed   Greater than 50% of time during this 45 minute visit was spent on counseling, explanation of diagnosis of left ACA/MCA stroke, reviewing risk factor management of HTN, HLD, DMII, PAF, CHF and CAD, discussion regarding residual deficit, planning of further management along with potential future management, and discussion with patient and family  answering all questions.  Frann Rider, AGNP-BC  [facility] [address] [place], [place] [address]  Phone [phone] Fax [phone] Note: This document was prepared with digital dictation and possible smart phrase technology. Any transcriptional errors that result from this process are unintentional.

## 2019-07-14 NOTE — Patient Instructions (Signed)
Continue outpatient therapies for ongoing improvement of stroke deficits  Continue warfarin daily  and Crestor  for secondary stroke prevention  Continue to follow up with PCP regarding cholesterol, blood pressure and diabetes management   Continue to follow with cardiology for ongoing monitoring and management  Continue to monitor blood pressure at home  Maintain strict control of hypertension with blood pressure goal below 130/90, diabetes with hemoglobin A1c goal below 6.5% and cholesterol with LDL cholesterol (bad cholesterol) goal below 70 mg/dL. I also advised the patient to eat a healthy diet with plenty of whole grains, cereals, fruits and vegetables, exercise regularly and maintain ideal body weight.  Followup in the future with me in 3 months or call earlier if needed       Thank you for coming to see [place] at [facility]. I hope we have been able to provide you high quality care today.  You may receive a patient satisfaction survey over the next few weeks. We would appreciate your feedback and comments so that we may continue to improve ourselves and the health of our patients.

## 2019-07-14 NOTE — Progress Notes (Signed)
I agree with the above plan 

## 2019-07-15 ENCOUNTER — Other Ambulatory Visit: Payer: Self-pay | Admitting: Physical Medicine and Rehabilitation

## 2019-07-25 ENCOUNTER — Other Ambulatory Visit: Payer: Self-pay | Admitting: Physical Medicine and Rehabilitation

## 2019-07-25 ENCOUNTER — Other Ambulatory Visit: Payer: Self-pay | Admitting: Cardiology

## 2019-08-12 ENCOUNTER — Other Ambulatory Visit: Payer: Self-pay | Admitting: Physical Medicine and Rehabilitation

## 2019-08-13 ENCOUNTER — Other Ambulatory Visit: Payer: Self-pay

## 2019-08-13 NOTE — Patient Outreach (Signed)
Telephone outreach to patient to obtain mRS was successfully completed. MRS=2  Jon [name] THN-Care Management Assistant [phone] 

## 2019-09-10 ENCOUNTER — Other Ambulatory Visit: Payer: Self-pay | Admitting: Physical Medicine and Rehabilitation

## 2019-09-17 ENCOUNTER — Other Ambulatory Visit: Payer: Self-pay | Admitting: Physical Medicine and Rehabilitation

## 2019-09-19 ENCOUNTER — Other Ambulatory Visit: Payer: Self-pay | Admitting: Physical Medicine and Rehabilitation

## 2019-09-21 NOTE — Telephone Encounter (Signed)
Patient is following with [place] PRN so refills should be sent to PCP; thank you!

## 2019-10-19 ENCOUNTER — Ambulatory Visit: Payer: Medicaid Other | Admitting: Adult Health

## 2019-10-19 ENCOUNTER — Encounter: Payer: Self-pay | Admitting: Adult Health

## 2019-10-19 VITALS — BP 130/77 | HR 49 | Ht 70.0 in | Wt 235.0 lb

## 2019-10-19 DIAGNOSIS — E1142 Type 2 diabetes mellitus with diabetic polyneuropathy: Secondary | ICD-10-CM | POA: Diagnosis not present

## 2019-10-19 DIAGNOSIS — E782 Mixed hyperlipidemia: Secondary | ICD-10-CM

## 2019-10-19 DIAGNOSIS — I10 Essential (primary) hypertension: Secondary | ICD-10-CM

## 2019-10-19 DIAGNOSIS — Z8673 Personal history of transient ischemic attack (TIA), and cerebral infarction without residual deficits: Secondary | ICD-10-CM

## 2019-10-19 DIAGNOSIS — I480 Paroxysmal atrial fibrillation: Secondary | ICD-10-CM

## 2019-10-19 NOTE — Progress Notes (Signed)
[facility] [address] [place]. [place] [address] [phone]       STROKE FOLLOW UP NOTE  Jon [name] Date of Birth:  1965/08/20 Medical Record Number:  [id number]   Reason for Referral:  stroke follow up    CHIEF COMPLAINT:  Chief Complaint  Patient presents with  . Follow-up    tx rm here for a f/u on a stroke. Pt is with his mother. Pt said it feels like something is biting him.    HPI:  Today, 10/19/2019, Jon [name] returns for stroke follow-up accompanied by his mother.  Residual deficits of speech impairment and right hemiparesis which has been stable.  He does report worsening of vision and intermittent "pinching" sensation throughout all his extremities.  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  Per patient and mother, this has been a chronic complaint. Continues to follow closely with cardiology and may potentially be having pacemaker replacement in the near future.  Recently placed on metoprolol by cardiology.  Previously working with therapy but currently on hold due to issues with tachycardia although currently bradycardic.  Denies associated dizziness, lightheadedness or syncope.  Mother reports worsening agitation and mood with quicker irritability.  Ongoing cognitive impairment with wax\waning of symptoms.  Continues on aspirin 81 mg daily, warfarin and Crestor for secondary stroke prevention.  INR level stable.  Blood pressure today 130/77.  No further concerns at this time.   History provided for reference purposes only Update 07/14/2019: Jon [name] is a 54 year old male who is being seen today for hospital follow-up accompanied by his mother.  Residual stroke deficits of expressive aphasia with dysarthria, dysphagia and right hemiparesis.  He continues to receive outpatient therapies with ongoing improvement. Currently ambulating with rolling walker and  denies any recent falls.  Denies residual swallowing difficulties and per patient, currently on a regular diet without difficulty.  Per mother, he has been having posterior headaches and neck pain which has been stable since his stroke without worsening - patient endorses not bothersome. Was recently started on Zanaflex by PMR. Continues on warfarin and aspirin 81 mg daily without bleeding or bruising.  INR levels stable per patient monitored by PCP/cardiology. Continues on Crestor without myalgias.  Blood pressure today 118/84. No further concerns at this time.  Stroke admission 05/08/2019: Jon Ray Hendersonis a 53 y.o.malewith history of prior stroke with residual dysarthria (ambulates with a walker), anxiety / depression, DM, PAF,ongoing tobacco use, CHB s/p PPM, migraines, CAD / CABG, type aortic dissection in 2014 with repair that was complicated and involved a brachiocephalic graft to the left carotid as the dissection extended into the brachiocephalic and left carotid, aortic stenosis status post mechanical aortic valve; on 2L [place] 02 @ baseline presented to the ED as a code stroke on 05/08/2019 for sudden onset of leftward gaze, aphasia, and right-sided flaccid paralysis.  Evaluated by stroke team and Dr. Pearlean Brownie with stroke work-up revealing left ACA/MCA infarcts with attempted IR, infarcts embolic due to mechanical valve, PAF and low EF in setting of subtherapeutic anticoagulation.  Prior to admission, he was admitted to [facility] for weakness of RLE treated for pneumonia and since discharge, only receiving Lovenox and not Coumadin. CTA head/neck showed thrombosis right innominate artery extending into right CCA and high-grade stenosis with abrupt occlusion of proximal A2, left ACA with reconstitution distally and extensive airspace disease in left apex suspicious for PNA.  CTA chest showed diffuse  lung opacity concerning for PNA and dissection or aneurysm.  He underwent cerebral angiogram  with attempted thrombectomy of left A2 occlusion without significant improvement in flow and nonflow limiting dissection of distal cervical segment identified. 2D echo showed an EF of 25 to 30% without cardiac source of embolus identified.  History of A. fib previously on Lovenox and warfarin but not taking warfarin and missed last dose of Lovenox.  Last INR 1.1.  Recommended to restart warfarin at discharge with INR goal 2.5-3.5.  HTN with episodes of hypotension and recommended long-term BP goal normotensive range.  LDL 69 and continued home dose statin.  Controlled DM 2 with A1c 6.9.  Concern for aspiration pneumonia with leukocytosis treated with antibiotics during admission.  Other stroke risk factors include current tobacco use, EtOH use, obesity, prior history of stroke 05/2012, family history of stroke, CAD s/p CABG, mechanical aortic valve on anticoagulation and CHF.  Residual stroke deficits of expressive aphasia with dysarthria, dysphagia, right hemiparesis and poor safety awareness and discharged to CIR for ongoing therapy needs.      ROS:   14 system review of systems performed and negative with exception of speech impairment, weakness, decreased vision, anxiety, depression  PMH:  Past Medical History:  Diagnosis Date  . Aneurysm of thoracic aorta (HCC) 05/06/2012   IMO routine update IMO routine update  . Anxiety    sees Dr. Renae FickleJohn Gage  . Aortic aneurysm and dissection (HCC) 05/06/2012  . Aortic stenosis    mechanical AVR 04/22/12  . Aortic valve disorder 05/06/2012  . 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 Bicuspid aortic valve   . Cerebral infarction (HCC) 05/11/2012  . Cerebral thrombosis with cerebral infarction (HCC) 07/14/2013  . Cholelithiasis 12/20/2016  . Current use of long term anticoagulation 12/20/2016  . CVA (cerebral infarction)    occipital CVA 12/13  . Dissecting aortic aneurysm (HCC) 05/06/2012  . Dysarthria 09/10/2017  . Dysrhythmia      se[facility] Geronimo [facility]myer[facility] [place]  . Generalized constriction of visual field 10/15/2012   IMOUPDATE IMOUPDATE  . 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 GERD (gastroesophageal reflux disease)   . HCAP (healthcare-associated pneumonia) 05/06/2012  . Headache(784.0)    "due to vision problem"  . History of CVA (cerebrovascular accident) 05/07/2012  . Hypothyroidism   . Idiopathic peripheral neuropathy 07/14/2013  . Late effects of CVA (cerebrovascular accident) 08/08/2015  . Migraine   . Nontraumatic multiple localized intracerebral hemorrhages (HCC) 10/16/2017  . Paroxysmal atrial fibrillation (HCC) 08/08/2015   On warfarin On warfarin  . Presence of prosthetic heart valve 08/08/2015  . Shortness of breath   . Stroke (HCC)    x3  . Stroke, embolic (HCC) 05/07/2012  . Syncope 05/06/2012  . Thoracic aortic aneurysm (HCC)    replacement aortic root 04/22/12  . VBI (vertebrobasilar insufficiency) 09/10/2017    PSH:  Past Surgical History:  Procedure Laterality Date  . APPLICATION OF WOUND VAC  06/11/2012   Procedure: APPLICATION OF WOUND VAC;  Surgeon: Loreli SlotSteven C Hendrickson, MD;  [facility];  Service: Vascular;  Laterality: Right;  . CARDIAC CATHETERIZATION    . CHOLECYSTECTOMY    . CORONARY ARTERY BYPASS GRAFT  04/22/2012   Procedure: CORONARY ARTERY BYPASS GRAFTING (CABG);  Surgeon: Loreli SlotSteven C Hendrickson, MD;  [facility];  Service: Open Heart Surgery;  Laterality: N/A;  times one with right greater saphenous vein graft  . I & D EXTREMITY  06/11/2012  Procedure: IRRIGATION AND DEBRIDEMENT EXTREMITY;  Surgeon: [name], MD;  Location: [facility] OR;  Service: Vascular;  Laterality: Right;  . INTRAOPERATIVE TRANSESOPHAGEAL ECHOCARDIOGRAM  04/22/2012   Procedure: INTRAOPERATIVE TRANSESOPHAGEAL ECHOCARDIOGRAM;  Surgeon: [name], MD;  Location: [facility] OR;  Service: Open Heart Surgery;  Laterality: N/A;  . IR CT HEAD LTD  05/08/2019  . IR  PERCUTANEOUS ART THROMBECTOMY/INFUSION INTRACRANIAL INC DIAG ANGIO  05/08/2019  . IR US GUIDE VASC ACCESS RIGHT  05/08/2019  . PERMANENT PACEMAKER INSERTION  10/2018   St. Jude's dual PPM  . RADIOLOGY WITH ANESTHESIA N/A 05/08/2019   Procedure: IR WITH ANESTHESIA;  Surgeon: Radiologist, Medication, MD;  Location: [facility] OR;  Service: Radiology;  Laterality: N/A;  . ROTATOR CUFF REPAIR     12 years ago, Left  . THORACIC AORTIC ANEURYSM REPAIR  04/22/2012   Procedure: THORACIC ASCENDING ANEURYSM REPAIR (AAA);  Surgeon: [name], MD;  Location: [facility] OR;  Service: Open Heart Surgery;  Laterality: N/A;    Social History:  Social History   Socioeconomic History  . Marital status: Married    Spouse name: Not on file  . Number of children: 2  . Years of education: Not on file  . Highest education level: Not on file  Occupational History  . Occupation: disabled  . Occupation: Curator  Tobacco Use  . Smoking status: Current Every Day Smoker    Packs/day: 2.50    Types: Cigarettes    Start date: 04/21/2012    Last attempt to quit: 04/21/2012    Years since quitting: 7.5  . Smokeless tobacco: Never Used  Substance and Sexual Activity  . Alcohol use: Yes  . Drug use: No    Comment: previous crack cocaine "quit 4 years ago"  . Sexual activity: Yes  Other Topics Concern  . Not on file  Social History Narrative  . Not on file   Social Determinants of Health   Financial Resource Strain:   . Difficulty of Paying Living Expenses:   Food Insecurity:   . Worried About Programme researcher, broadcasting/film/video in the Last Year:   . Barista in the Last Year:   Transportation Needs:   . Freight forwarder (Medical):   Marland Kitchen Lack of Transportation (Non-Medical):   Physical Activity:   . Days of Exercise per Week:   . Minutes of Exercise per Session:   Stress:   . Feeling of Stress :   Social Connections:   . Frequency of Communication with Friends and Family:   . Frequency of Social Gatherings  with Friends and Family:   . Attends Religious Services:   . Active Member of Clubs or Organizations:   . Attends Banker Meetings:   Marland Kitchen Marital Status:   Intimate Partner Violence:   . Fear of Current or Ex-Partner:   . Emotionally Abused:   Marland Kitchen Physically Abused:   . Sexually Abused:     Family History:  Family History  Problem Relation Age of Onset  . Lung disease Mother   . Diabetes Father   . Peripheral Artery Disease Father   . Hypertension Father   . CAD Father   . Heart disease Father   . Stroke Father   . Cancer Maternal Grandfather   . Heart disease Paternal Grandfather     Medications:   Current Outpatient Medications on File Prior to Visit  Medication Sig Dispense Refill  . acetaminophen (TYLENOL) 325 MG tablet Take 1-2 tablets (325-650  mg total) by mouth every 4 (four) hours as needed for mild pain.    Marland Kitchen aspirin 81 MG chewable tablet Chew 1 tablet (81 mg total) by mouth daily. 100 tablet 1  . furosemide (LASIX) 40 MG tablet Take 1 tablet by mouth twice daily 60 tablet 0  . gabapentin (NEURONTIN) 300 MG capsule Take 1 capsule (300 mg total) by mouth 3 (three) times daily. 90 capsule 0  . levothyroxine (SYNTHROID) 50 MCG tablet Take 1 tablet (50 mcg total) by mouth daily at 6 (six) AM. 30 tablet 0  . mouth rinse LIQD solution 15 mLs by Mouth Rinse route 2 times daily at 12 noon and 4 pm.  0  . pantoprazole (PROTONIX) 40 MG tablet Take 1 tablet (40 mg total) by mouth daily. 30 tablet 0  . potassium chloride (KLOR-CON) 10 MEQ tablet Take 10 mEq by mouth daily.    . rosuvastatin (CRESTOR) 40 MG tablet Take 1 tablet (40 mg total) by mouth daily. 30 tablet 0  . tiZANidine (ZANAFLEX) 2 MG tablet Take 1 tablet (2 mg total) by mouth 3 (three) times daily as needed for muscle spasms. 30 tablet 1  . topiramate (TOPAMAX) 50 MG tablet Take 1 tablet (50 mg total) by mouth daily. 30 tablet 0  . umeclidinium bromide (INCRUSE ELLIPTA) 62.5 MCG/INH AEPB Inhale 1 puff into  the lungs daily. 30 each 0  . warfarin (COUMADIN) 7.5 MG tablet Take 1 tablet (7.5 mg total) by mouth daily at 6 PM. (Patient taking differently: Take 5 mg by mouth daily at 6 PM. ) 30 tablet 0   No current facility-administered medications on file prior to visit.    Allergies:   Allergies  Allergen Reactions  . Hydrocodone-Acetaminophen Other (See Comments)    PT MOTHER DOES NOT REMEMBER REACTION  Only use in Emergency   . Hydromorphone Itching and Rash       . Other Other (See Comments)    ALL NARCOTICS-ITCHING, RASH   . Oxycodone Other (See Comments)    Unknown reaction  . Penicillins Rash    Did it involve swelling of the face/tongue/throat, SOB, or low BP? Unknown Did it involve sudden or severe rash/hives, skin peeling, or any reaction on the inside of your mouth or nose? Unknown Did you need to seek medical attention at a hospital or doctor's office? Unknown When did it last happen?unknown If all above answers are "NO", may proceed with cephalosporin use.  . Dilaudid [Hydromorphone Hcl] Itching and Rash     Physical Exam  Vitals:   10/19/19 0906  BP: 130/77  Pulse: (!) 49  Weight: 235 lb (106.6 kg)  Height: 5\' 10"  (1.778 m)   Body mass index is 33.72 kg/m. No exam data present  No flowsheet data found.   General: well developed, well nourished,  pleasantly anxious middle-age Caucasian male, seated, in no evident distress Head: head normocephalic and atraumatic.   Neck: supple with no carotid or supraclavicular bruits Cardiovascular: regular rate and rhythm, no murmurs Musculoskeletal: no deformity Skin:  no rash/petichiae Vascular:  Normal pulses all extremities   Neurologic Exam Mental Status: Awake and fully alert.  Expressive aphasia with moderate dysarthria.  Oriented to place and time. Recent memory diminished and remote memory intact. Attention span, concentration and fund of knowledge appropriate during visit. Mood and affect appropriate but  would quickly get irritated with his mother.  Cranial Nerves: Pupils equal, briskly reactive to light. Extraocular movements full without nystagmus. Visual fields full to  confrontation. Hearing intact. Facial sensation intact. Right facial droop  Motor: Normal bulk and tone. Normal strength in all tested extremity muscles except mild right hand weakness and mild right hip flexor weakness. Sensory.: intact to touch , pinprick , position and vibratory sensation.  Coordination: Rapid alternating movements normal in all extremities except mildly decreased right hand. Finger-to-nose mild right hand ataxia and heel-to-shin performed accurately bilaterally. Gait and Station: Arises from chair without difficulty. Stance is normal. Gait demonstrates  hemiplegic gait with mild imbalance  Reflexes: 1+ and symmetric. Toes downgoing.      ASSESSMENT: Jon [name] is a 54 y.o. year old male presented with leftward gaze, aphasia and right-sided flaccid paralysis on 05/08/2019 with stroke work-up revealing left ACA/MCA infarcts with unsuccessful IR, infarcts embolic secondary to mechanical valve, PAF, and low EF in setting of subtherapeutic anticoagulation. Vascular risk factors include prior stroke with residual dysarthria, DM, HTN, HLD, PAF, CAD/CABG, CHB s/p PPM, aortic stenosis s/p mechanical heart valve, aortic dissection 2014 and current tobacco use. Residual deficit of mild right hemiparesis, expressive aphasia and dysarthria.  Reports chronic visual impairment worsening with ambulation    PLAN:  1. Left ACA/MCA stroke:  -Recommend restarting therapy once stabilization of heart rate -Continue aspirin 81 mg daily and warfarin daily  and Crestor for secondary stroke prevention.  -Maintain strict control of hypertension with blood pressure goal below 130/90, diabetes with hemoglobin A1c goal below 6.5% and cholesterol with LDL cholesterol (bad cholesterol) goal below 70 mg/dL.  I also advised the  patient to eat a healthy diet with plenty of whole grains, cereals, fruits and vegetables, exercise regularly with at least 30 minutes of continuous activity daily and maintain ideal body weight. 2. HTN: Stable.  Continue to follow with PCP for monitoring management 3. HLD: Continuation of Crestor and ongoing follow-up with PCP for monitoring and management 4. DMII: Continue to follow with PCP for monitoring and management 5. PAF, CHF and CAD: Ongoing use of warfarin and ongoing follow-up with cardiology 6. Visual impairment: Reports chronic history with mother reporting deemed legally blind.  Reports worsening of vision with ambulation and improvement after resting.  Question possible cardiac related and advised to speak further with cardiologist as well as ongoing follow-up with ophthalmology   Follow-up in 6 months or call earlier if needed   I spent 35 minutes of face-to-face and non-face-to-face time with patient and mother.  This included previsit chart review, lab review, study review, order entry, electronic health record documentation, patient education    Jon [name], [facility]  [facility] [address] [place], [place] [address]  Phone [phone] Fax [phone] Note: This document was prepared with digital dictation and possible smart phrase technology. Any transcriptional errors that result from this process are unintentional.

## 2019-10-20 ENCOUNTER — Encounter: Payer: Self-pay | Admitting: Adult Health

## 2019-10-23 NOTE — Progress Notes (Signed)
I agree with the above plan 

## 2019-11-17 ENCOUNTER — Ambulatory Visit (INDEPENDENT_AMBULATORY_CARE_PROVIDER_SITE_OTHER): Payer: Medicaid Other | Admitting: *Deleted

## 2019-11-17 DIAGNOSIS — I480 Paroxysmal atrial fibrillation: Secondary | ICD-10-CM | POA: Diagnosis not present

## 2019-11-17 LAB — CUP PACEART REMOTE DEVICE CHECK
Battery Remaining Longevity: 118 mo
Battery Remaining Percentage: 95.5 %
Battery Voltage: 3.01 V
Brady Statistic AP VP Percent: 64 %
Brady Statistic AP VS Percent: 1 %
Brady Statistic AS VP Percent: 34 %
Brady Statistic AS VS Percent: 1 %
Brady Statistic RA Percent Paced: 64 %
Brady Statistic RV Percent Paced: 98 %
Date Time Interrogation Session: 20210713020018
Implantable Lead Implant Date: 20200622
Implantable Lead Implant Date: 20200622
Implantable Lead Location: 753859
Implantable Lead Location: 753860
Implantable Pulse Generator Implant Date: 20200622
Lead Channel Impedance Value: 450 Ohm
Lead Channel Impedance Value: 480 Ohm
Lead Channel Pacing Threshold Amplitude: 0.625 V
Lead Channel Pacing Threshold Amplitude: 0.75 V
Lead Channel Pacing Threshold Pulse Width: 0.5 ms
Lead Channel Pacing Threshold Pulse Width: 0.5 ms
Lead Channel Sensing Intrinsic Amplitude: 10.8 mV
Lead Channel Sensing Intrinsic Amplitude: 2.7 mV
Lead Channel Setting Pacing Amplitude: 0.875
Lead Channel Setting Pacing Amplitude: 2 V
Lead Channel Setting Pacing Pulse Width: 0.5 ms
Lead Channel Setting Sensing Sensitivity: 0.7 mV
Pulse Gen Model: 2272
Pulse Gen Serial Number: 3311512

## 2019-11-19 NOTE — Progress Notes (Signed)
Remote pacemaker transmission.   

## 2019-12-22 ENCOUNTER — Telehealth: Payer: Self-pay

## 2019-12-22 NOTE — Telephone Encounter (Signed)
Merlin alert received, ongoing AF and RV noted in high output mode.  Jon [name] is on.  Spoke with pt mother (DPR on file).  She stated that pt is currently being treated by DR. Akbary in [place].  There is plan for device upgrade.  She stated pt plans to continue care with Dr. Rudolpho Sevin and would like remote monitoring to be forwarded to them.

## 2019-12-28 ENCOUNTER — Ambulatory Visit: Payer: Medicaid Other | Admitting: Adult Health

## 2020-01-12 ENCOUNTER — Encounter: Payer: Self-pay | Admitting: Adult Health

## 2020-01-12 ENCOUNTER — Ambulatory Visit: Payer: Medicaid Other | Admitting: Adult Health

## 2020-01-12 ENCOUNTER — Other Ambulatory Visit: Payer: Self-pay

## 2020-01-12 ENCOUNTER — Other Ambulatory Visit: Payer: Self-pay | Admitting: Cardiology

## 2020-01-12 VITALS — BP 108/70 | HR 60 | Ht 70.0 in | Wt 232.2 lb

## 2020-01-12 DIAGNOSIS — E1142 Type 2 diabetes mellitus with diabetic polyneuropathy: Secondary | ICD-10-CM | POA: Diagnosis not present

## 2020-01-12 DIAGNOSIS — E782 Mixed hyperlipidemia: Secondary | ICD-10-CM

## 2020-01-12 DIAGNOSIS — I480 Paroxysmal atrial fibrillation: Secondary | ICD-10-CM

## 2020-01-12 DIAGNOSIS — I10 Essential (primary) hypertension: Secondary | ICD-10-CM

## 2020-01-12 DIAGNOSIS — Z8673 Personal history of transient ischemic attack (TIA), and cerebral infarction without residual deficits: Secondary | ICD-10-CM | POA: Diagnosis not present

## 2020-01-12 NOTE — Patient Instructions (Addendum)
Restart therapy once you are cleared by cardiology   Continue aspirin 81 mg daily and warfarin daily  and Crestor for secondary stroke prevention  Continue to follow with cardiology closely for atrial fibrillation and other chronic cardiac conditions for routine monitoring management  Continue to follow up with PCP regarding cholesterol, blood pressure and diabetes management  Maintain strict control of hypertension with blood pressure goal below 130/90, diabetes with hemoglobin A1c goal below 7.0% and cholesterol with LDL cholesterol (bad cholesterol) goal below 70 mg/dL.       Followup in the future with me in 6 months or call earlier if needed       Thank you for coming to see [place] at [facility]. I hope we have been able to provide you high quality care today.  You may receive a patient satisfaction survey over the next few weeks. We would appreciate your feedback and comments so that we may continue to improve ourselves and the health of our patients.

## 2020-01-12 NOTE — Progress Notes (Signed)
I agree with the above plan 

## 2020-01-12 NOTE — Progress Notes (Signed)
[facility] [address] [place]. [place] [address] [phone]       STROKE FOLLOW UP NOTE  Jon [name]:  [birth year] Medical Record Number:  [id number]   Reason for Referral:  stroke follow up    CHIEF COMPLAINT:  Chief Complaint  Patient presents with  . Follow-up    3 month f/u. States he recently had a pacemaker placed. States he has been doing well.   . room 5    with mother    HPI:  Today, 01/12/2020, Jon [name] returns for stroke follow-up accompanied by his mother  Patient reports residual deficits of right hemiparesis, speech/language deficit and cognitive impairment Does report improvement since prior visit and continues to use rolling walker for ambulation.  Has not restarted therapies as previously on hold due to cardiac concerns Continues to experience fluctuation of visual impairment with exertion and will improve quickly once resting.  This has been a chronic complaint and denies worsening. Mother reports occasional cognitive impairment and increased restlessness with anxiety.  Has been restarted on antianxiety medication by PCP per mother but she is unable to provide name of medication Denies new or worsening stroke/TIA symptoms  Remains on aspirin 81 mg daily and warfarin reporting stable INR levels and denies bleeding or bruising Remains on Crestor 40 mg daily without myalgias Blood pressure today 108/70 Continue to follow closely with PCP for HTN and HLD management  01/04/2020 upgrade of dual-chamber pacemaker to biventricular pacemaker due to deteriorating LV function by [facility] cardiology without complication.  Continues to follow closely with cardiology for known nonischemic cardiomyopathy with severe LV dysfunction with EF 25 to 30%, chronic systolic heart failure, proximal atrial fibrillation, and s/p aortic dissection  No further concerns at this time   History provided for reference purposes  only Update 10/19/2019 JM: Jon [name] returns for stroke follow-up accompanied by his mother.  Residual deficits of speech impairment and right hemiparesis which has been stable.  He does report worsening of vision and intermittent "pinching" sensation throughout all his extremities.  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  Per patient and mother, this has been a chronic complaint. Continues to follow closely with cardiology and may potentially be having pacemaker replacement in the near future.  Recently placed on metoprolol by cardiology.  Previously working with therapy but currently on hold due to issues with tachycardia although currently bradycardic.  Denies associated dizziness, lightheadedness or syncope.  Mother reports worsening agitation and mood with quicker irritability.  Ongoing cognitive impairment with wax\waning of symptoms.  Continues on aspirin 81 mg daily, warfarin and Crestor for secondary stroke prevention.  INR level stable.  Blood pressure today 130/77.  No further concerns at this time.  Update 07/14/2019: Jon [name] is a 54 year old male who is being seen today for hospital follow-up accompanied by his mother.  Residual stroke deficits of expressive aphasia with dysarthria, dysphagia and right hemiparesis.  He continues to receive outpatient therapies with ongoing improvement. Currently ambulating with rolling walker and denies any recent falls.  Denies residual swallowing difficulties and per patient, currently on a regular diet without difficulty.  Per mother, he has been having posterior headaches and neck pain which has been stable since his stroke without worsening - patient endorses not bothersome. Was recently started on Zanaflex by PMR. Continues on warfarin and aspirin 81 mg daily without bleeding or bruising.  INR levels stable per  patient monitored by PCP/cardiology. Continues on Crestor without  myalgias.  Blood pressure today 118/84. No further concerns at this time.  Stroke admission 05/08/2019: Jon Ray Hendersonis a 53 y.o.malewith history of prior stroke with residual dysarthria (ambulates with a walker), anxiety / depression, DM, PAF,ongoing tobacco use, CHB s/p PPM, migraines, CAD / CABG, type aortic dissection in 2014 with repair that was complicated and involved a brachiocephalic graft to the left carotid as the dissection extended into the brachiocephalic and left carotid, aortic stenosis status post mechanical aortic valve; on 2L [place] 02 @ baseline presented to the ED as a code stroke on 05/08/2019 for sudden onset of leftward gaze, aphasia, and right-sided flaccid paralysis.  Evaluated by stroke team and Dr. Pearlean BrownieSethi with stroke work-up revealing left ACA/MCA infarcts with attempted IR, infarcts embolic due to mechanical valve, PAF and low EF in setting of subtherapeutic anticoagulation.  Prior to admission, he was [facility] for weakness of RLE treated for pneumonia and since discharge, only receiving Lovenox and not Coumadin. CTA head/neck showed thrombosis right innominate artery extending into right CCA and high-grade stenosis with abrupt occlusion of proximal A2, left ACA with reconstitution distally and extensive airspace disease in left apex suspicious for PNA.  CTA chest showed diffuse lung opacity concerning for PNA and dissection or aneurysm.  He underwent cerebral angiogram with attempted thrombectomy of left A2 occlusion without significant improvement in flow and nonflow limiting dissection of distal cervical segment identified. 2D echo showed an EF of 25 to 30% without cardiac source of embolus identified.  History of A. fib previously on Lovenox and warfarin but not taking warfarin and missed last dose of Lovenox.  Last INR 1.1.  Recommended to restart warfarin at discharge with INR goal 2.5-3.5.  HTN with episodes of hypotension and recommended long-term BP  goal normotensive range.  LDL 69 and continued home dose statin.  Controlled DM 2 with A1c 6.9.  Concern for aspiration pneumonia with leukocytosis treated with antibiotics during admission.  Other stroke risk factors include current tobacco use, EtOH use, obesity, prior history of stroke 05/2012, family history of stroke, CAD s/p CABG, mechanical aortic valve on anticoagulation and CHF.  Residual stroke deficits of expressive aphasia with dysarthria, dysphagia, right hemiparesis and poor safety awareness and discharged to CIR for ongoing therapy needs.  Stroke: L ACA/MCA infarcts w/o attempted IR, infarcts embolic d/t mechanical valve + PAF + low EF in setting of subtherapeutic anticoagulation   Code Stroke CT Head - 2.1 x 1.1 cm focus of abnormal hypodensity within the left aspect of the callosal body and possibly also involving the pericallosal gyrus on the left.  CT head- 05/10/2019 -Non flow limiting dissection was identified in the distal cervical segment.  CTA Head -The right ICA is markedly diminutive through the siphon region with sites of high-grade stenosis. Mixed plaque also results in mild/moderate stenosis of the supraclinoid right ICA. Abrupt occlusion of the proximal A2 left anterior cerebral artery. There appears to be some reconstitution of the left ACA distally.   CTA Neck - there is a greater stenosis within the proximal left common carotid artery than originally stated - likely moderate/severe. Thrombosis of the right innominate artery. Thrombus extends into the proximal right common carotid artery with high-grade stenosis. Sites of high-grade stenosis within the siphon region.   CT Perfusion - 125 mL region of hypoperfused parenchyma predominantly within the left ACA and ACA/MCA watershed territory, as well as right ACA/MCA watershed territory.  IR Thrombectomy  Attempt- Left A2 occlusion, remained occluded at the conclusion  MRI head -L MCA/ACA infarcts   2D Echo - EF 25 to  30%.No cardiac source of emboli identified.   Sars Corona Virus 2 - negative  LDL - 67  HgbA1c- 6.9  VTE prophylaxis - Heparin IV  Prescribed full dose lovenox and warfarin but not taking warfarin and missed last dose of lovenox, now on aspirin81mg ,Heparin IVbridge towarfarin.  Therapy recommendations: CIR  Disposition: CIR       ROS:   14 system review of systems performed and negative with exception of those reported in HPI  PMH:  Past Medical History:  Diagnosis Date  . Aneurysm of thoracic aorta (HCC) 05/06/2012   IMO routine update IMO routine update  . Anxiety    sees Dr. Renae Fickle  . Aortic aneurysm and dissection (HCC) 05/06/2012  . Aortic stenosis    mechanical AVR 04/22/12  . Aortic valve disorder 05/06/2012  . 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 Bicuspid aortic valve   . Cerebral infarction (HCC) 05/11/2012  . Cerebral thrombosis with cerebral infarction (HCC) 07/14/2013  . Cholelithiasis 12/20/2016  . Current use of long term anticoagulation 12/20/2016  . CVA (cerebral infarction)    occipital CVA 12/13  . Dissecting aortic aneurysm (HCC) 05/06/2012  . Dysarthria 09/10/2017  . Dysrhythmia    sees Dr. Geronimo Running, [facility] cardiology in [place]  . Generalized constriction of visual field 10/15/2012   IMOUPDATE IMOUPDATE  . 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 GERD (gastroesophageal reflux disease)   . HCAP (healthcare-associated pneumonia) 05/06/2012  . Headache(784.0)    "due to vision problem"  . History of CVA (cerebrovascular accident) 05/07/2012  . Hypothyroidism   . Idiopathic peripheral neuropathy 07/14/2013  . Late effects of CVA (cerebrovascular accident) 08/08/2015  . Migraine   . Nontraumatic multiple localized intracerebral hemorrhages (HCC) 10/16/2017  . Paroxysmal atrial fibrillation (HCC) 08/08/2015   On warfarin On warfarin  . Presence of prosthetic heart valve 08/08/2015  . Shortness of  breath   . Stroke (HCC)    x3  . Stroke, embolic (HCC) 05/07/2012  . Syncope 05/06/2012  . Thoracic aortic aneurysm (HCC)    replacement aortic root 04/22/12  . VBI (vertebrobasilar insufficiency) 09/10/2017    PSH:  Past Surgical History:  Procedure Laterality Date  . APPLICATION OF WOUND VAC  06/11/2012   Procedure: APPLICATION OF WOUND VAC;  Surgeon: [name], MD;  Location: [facility] OR;  Service: Vascular;  Laterality: Right;  . CARDIAC CATHETERIZATION    . CHOLECYSTECTOMY    . CORONARY ARTERY BYPASS GRAFT  04/22/2012   Procedure: CORONARY ARTERY BYPASS GRAFTING (CABG);  Surgeon: [name], MD;  Location: [facility] OR;  Service: Open Heart Surgery;  Laterality: N/A;  times one with right greater saphenous vein graft  . I & D EXTREMITY  06/11/2012   Procedure: IRRIGATION AND DEBRIDEMENT EXTREMITY;  Surgeon: [name], MD;  Location: [facility] OR;  Service: Vascular;  Laterality: Right;  . INTRAOPERATIVE TRANSESOPHAGEAL ECHOCARDIOGRAM  04/22/2012   Procedure: INTRAOPERATIVE TRANSESOPHAGEAL ECHOCARDIOGRAM;  Surgeon: [name], MD;  Location: [facility] OR;  Service: Open Heart Surgery;  Laterality: N/A;  . IR CT HEAD LTD  05/08/2019  . IR PERCUTANEOUS ART THROMBECTOMY/INFUSION INTRACRANIAL INC DIAG ANGIO  05/08/2019  . IR US GUIDE VASC ACCESS RIGHT  05/08/2019  . PERMANENT PACEMAKER INSERTION  10/2018   [employer] dual PPM  . RADIOLOGY WITH ANESTHESIA N/A 05/08/2019  Procedure: IR WITH ANESTHESIA;  Surgeon: Radiologist, Medication, MD;  Location: [facility] OR;  Service: Radiology;  Laterality: N/A;  . ROTATOR CUFF REPAIR     12 years ago, Left  . THORACIC AORTIC ANEURYSM REPAIR  04/22/2012   Procedure: THORACIC ASCENDING ANEURYSM REPAIR (AAA);  Surgeon: [name], MD;  Location: [facility] OR;  Service: Open Heart Surgery;  Laterality: N/A;    Social History:  Social History   Socioeconomic History  . Marital status: Married    Spouse name: Not on file  . Number of children:  2  . Years of education: Not on file  . Highest education level: Not on file  Occupational History  . Occupation: disabled  . Occupation: Curator  Tobacco Use  . Smoking status: Current Every Day Smoker    Packs/day: 2.50    Types: Cigarettes    Start date: 04/21/2012    Last attempt to quit: 04/21/2012    Years since quitting: 7.7  . Smokeless tobacco: Never Used  Substance and Sexual Activity  . Alcohol use: Yes  . Drug use: No    Comment: previous crack cocaine "quit 4 years ago"  . Sexual activity: Yes  Other Topics Concern  . Not on file  Social History Narrative  . Not on file   Social Determinants of Health   Financial Resource Strain:   . Difficulty of Paying Living Expenses: Not on file  Food Insecurity:   . Worried About Programme researcher, broadcasting/film/video in the Last Year: Not on file  . Ran Out of Food in the Last Year: Not on file  Transportation Needs:   . Lack of Transportation (Medical): Not on file  . Lack of Transportation (Non-Medical): Not on file  Physical Activity:   . Days of Exercise per Week: Not on file  . Minutes of Exercise per Session: Not on file  Stress:   . Feeling of Stress : Not on file  Social Connections:   . Frequency of Communication with Friends and Family: Not on file  . Frequency of Social Gatherings with Friends and Family: Not on file  . Attends Religious Services: Not on file  . Active Member of Clubs or Organizations: Not on file  . Attends Banker Meetings: Not on file  . Marital Status: Not on file  Intimate Partner Violence:   . Fear of Current or Ex-Partner: Not on file  . Emotionally Abused: Not on file  . Physically Abused: Not on file  . Sexually Abused: Not on file    Family History:  Family History  Problem Relation Age of Onset  . Lung disease Mother   . Diabetes Father   . Peripheral Artery Disease Father   . Hypertension Father   . CAD Father   . Heart disease Father   . Stroke Father   . Cancer  Maternal Grandfather   . Heart disease Paternal Grandfather     Medications:   Current Outpatient Medications on File Prior to Visit  Medication Sig Dispense Refill  . acetaminophen (TYLENOL) 325 MG tablet Take 1-2 tablets (325-650 mg total) by mouth every 4 (four) hours as needed for mild pain.    Marland Kitchen amiodarone (PACERONE) 200 MG tablet Take by mouth.    Marland Kitchen aspirin 81 MG chewable tablet Chew 1 tablet (81 mg total) by mouth daily. 100 tablet 1  . furosemide (LASIX) 40 MG tablet Take 1 tablet by mouth twice daily 60 tablet 0  . gabapentin (NEURONTIN) 300  MG capsule Take 1 capsule (300 mg total) by mouth 3 (three) times daily. 90 capsule 0  . levothyroxine (SYNTHROID) 50 MCG tablet Take 1 tablet (50 mcg total) by mouth daily at 6 (six) AM. 30 tablet 0  . metoprolol tartrate (LOPRESSOR) 50 MG tablet Take by mouth.    . montelukast (SINGULAIR) 10 MG tablet Take 1 tablet by mouth daily.    . pantoprazole (PROTONIX) 40 MG tablet Take 1 tablet (40 mg total) by mouth daily. 30 tablet 0  . potassium chloride (KLOR-CON) 10 MEQ tablet Take 10 mEq by mouth daily.    . rosuvastatin (CRESTOR) 40 MG tablet Take 1 tablet (40 mg total) by mouth daily. 30 tablet 0  . SYMBICORT 160-4.5 MCG/ACT inhaler 2 puffs 2 (two) times daily.    Marland Kitchen tiZANidine (ZANAFLEX) 2 MG tablet Take 1 tablet (2 mg total) by mouth 3 (three) times daily as needed for muscle spasms. 30 tablet 1  . topiramate (TOPAMAX) 50 MG tablet Take 1 tablet (50 mg total) by mouth daily. 30 tablet 0  . umeclidinium bromide (INCRUSE ELLIPTA) 62.5 MCG/INH AEPB Inhale 1 puff into the lungs daily. 30 each 0  . warfarin (COUMADIN) 7.5 MG tablet Take 1 tablet (7.5 mg total) by mouth daily at 6 PM. (Patient taking differently: Take 5 mg by mouth daily at 6 PM. ) 30 tablet 0   No current facility-administered medications on file prior to visit.    Allergies:   Allergies  Allergen Reactions  . Hydrocodone-Acetaminophen Other (See Comments)    PT MOTHER DOES  NOT REMEMBER REACTION  Only use in Emergency   . Hydromorphone Itching and Rash       . Other Other (See Comments)    ALL NARCOTICS-ITCHING, RASH   . Oxycodone Other (See Comments)    Unknown reaction  . Penicillins Rash    Did it involve swelling of the face/tongue/throat, SOB, or low BP? Unknown Did it involve sudden or severe rash/hives, skin peeling, or any reaction on the inside of your mouth or nose? Unknown Did you need to seek medical attention at a hospital or doctor's office? Unknown When did it last happen?unknown If all above answers are "NO", may proceed with cephalosporin use.  . Dilaudid [Hydromorphone Hcl] Itching and Rash     Physical Exam  Vitals:   01/12/20 0840  BP: 108/70  Pulse: 60  Weight: 232 lb 3.2 oz (105.3 kg)  Height: 5\' 10"  (1.778 m)   Body mass index is 33.32 kg/m. No exam data present  General: well developed, well nourished, pleasant middle-age Caucasian male, seated, in no evident distress Head: head normocephalic and atraumatic.   Neck: supple with no carotid or supraclavicular bruits Cardiovascular: regular rate and rhythm, no murmurs Musculoskeletal: no deformity Skin:  no rash/petichiae Vascular:  Normal pulses all extremities   Neurologic Exam Mental Status: Awake and fully alert. Expressive aphasia with moderate dysarthria worsened with increased rate of speech.  Oriented to place and time. Recent memory diminished and remote memory intact. Attention span, concentration and fund of knowledge appropriate during visit but occasionally subjectively impaired. Mood and affect appropriate Cranial Nerves: Pupils equal, briskly reactive to light. Extraocular movements full without nystagmus. Visual fields full to confrontation. Hearing intact. Facial sensation intact. Right facial droop  Motor: Normal bulk and tone. Normal strength in all tested extremity muscles except mild right hand weakness  Sensory.: intact to touch , pinprick ,  position and vibratory sensation.  Coordination: Rapid alternating movements  normal in all extremities except mildly decreased right hand. Finger-to-nose mild right hand ataxia and heel-to-shin performed accurately bilaterally. Gait and Station: Arises from chair without difficulty. Stance is normal. Gait demonstrates  normal stride length with mild imbalance with use of rolling walker.  Ambulated short distance without AD and was quickly raise on toes.  Tandem gait not attempted. Reflexes: 1+ and symmetric. Toes downgoing.      ASSESSMENT/PLAN: Kay Ricciuti is a 54 y.o. year old male presented with leftward gaze, aphasia and right-sided flaccid paralysis on 05/08/2019 with stroke work-up revealing left ACA/MCA infarcts with unsuccessful IR, infarcts embolic secondary to mechanical valve, PAF, and low EF in setting of subtherapeutic anticoagulation. Vascular risk factors include prior stroke with residual dysarthria, DM, HTN, HLD, PAF, CAD/CABG, CHB s/p PPM, aortic stenosis s/p mechanical heart valve, aortic dissection 2014 and current tobacco use.      1. Left ACA/MCA stroke:  a. Residual deficits: Right hemiparesis, expressive aphasia and dysarthria and cognitive impairment.  Recommend restarting therapy once cleared by cardiology post procedure for upgraded pacemaker (8/30).  Mother requested refills for tizanidine but advised to obtain refill through PCP as previously prescribed through PMR b. Continue aspirin 81 mg daily and warfarin daily  and Crestor for secondary stroke prevention.  c. Discussed importance of close PCP follow-up for aggressive stroke risk factor management 2. Visual fluctuation a. Chronic complaint since 2014 reporting worsening vision with exertion and improves quickly at rest. b. Likely in setting of prior strokes, visual impairment and decreased cardiac functioning c. Denies worsening of symptoms therefore no indication for further evaluation at this time 3. HTN:  BP goal<130/90.  Discussed importance of avoiding hypotension to ensure adequate perfusion.  Stable today.  Currently diet controlled.  Managed by PCP 4. HLD: LDL goal<70.  Remains on Crestor 40 mg daily.  Managed by PCP. 5. DMII: A1c goal<7.0.  Currently diet controlled.  Managed by PCP. 6. PAF, CHF, CAD and cerebral artery stenosis: Remains on warfarin with INR goal 2.5-3.5.  Pacemaker upgrade to a biventricular pacemaker on 01/04/2020 due to LV function deterioration without complication.  Managed and monitored by cardiology.  Routine carotid ultrasounds obtained by cardiology    Follow-up in 6 months or call earlier if needed   I spent 30 minutes of face-to-face and non-face-to-face time with patient and mother.  This included previsit chart review, lab review, study review, order entry, electronic health record documentation, patient education regarding history of prior strokes, residual deficits, importance of managing stroke risk factors and answered all questions to patient and mother satisfaction    Ihor Austin, AGNP-BC  [facility] [address] [place], [place] [address]  Phone [phone] Fax [phone] Note: This document was prepared with digital dictation and possible smart phrase technology. Any transcriptional errors that result from this process are unintentional.

## 2020-07-05 DEATH — deceased

## 2020-07-11 ENCOUNTER — Ambulatory Visit: Payer: Medicaid Other | Admitting: Adult Health
